# Patient Record
Sex: Female | Born: 1970
Health system: Southern US, Community
[De-identification: ages and names within clinical notes are randomized; demographics above are authoritative.]

## PROBLEM LIST (undated history)

## (undated) DIAGNOSIS — E271 Primary adrenocortical insufficiency: Secondary | ICD-10-CM

## (undated) DIAGNOSIS — Z87442 Personal history of urinary calculi: Secondary | ICD-10-CM

## (undated) DIAGNOSIS — G4730 Sleep apnea, unspecified: Secondary | ICD-10-CM

## (undated) DIAGNOSIS — T7840XA Allergy, unspecified, initial encounter: Secondary | ICD-10-CM

## (undated) DIAGNOSIS — M549 Dorsalgia, unspecified: Secondary | ICD-10-CM

## (undated) DIAGNOSIS — K219 Gastro-esophageal reflux disease without esophagitis: Secondary | ICD-10-CM

## (undated) DIAGNOSIS — M1990 Unspecified osteoarthritis, unspecified site: Secondary | ICD-10-CM

## (undated) DIAGNOSIS — T8859XA Other complications of anesthesia, initial encounter: Secondary | ICD-10-CM

## (undated) HISTORY — DX: Unspecified osteoarthritis, unspecified site: M19.90

## (undated) HISTORY — PX: WISDOM TOOTH EXTRACTION: SHX21

## (undated) HISTORY — PX: CHOLECYSTECTOMY: SHX55

## (undated) HISTORY — PX: OTHER SURGICAL HISTORY: SHX169

## (undated) HISTORY — DX: Primary adrenocortical insufficiency: E27.1

## (undated) HISTORY — PX: POLYPECTOMY: SHX149

## (undated) HISTORY — DX: Gastro-esophageal reflux disease without esophagitis: K21.9

---

## 2002-08-01 HISTORY — PX: DIAGNOSTIC LAPAROSCOPY: SUR761

## 2008-08-01 HISTORY — PX: DILATION AND CURETTAGE OF UTERUS: SHX78

## 2008-08-01 HISTORY — PX: CHOLECYSTECTOMY: SHX55

## 2018-03-15 ENCOUNTER — Other Ambulatory Visit (HOSPITAL_COMMUNITY): Payer: Self-pay | Admitting: Neurology

## 2018-03-15 DIAGNOSIS — M5416 Radiculopathy, lumbar region: Secondary | ICD-10-CM

## 2018-03-19 DIAGNOSIS — E271 Primary adrenocortical insufficiency: Secondary | ICD-10-CM | POA: Diagnosis not present

## 2018-03-19 DIAGNOSIS — G8929 Other chronic pain: Secondary | ICD-10-CM | POA: Diagnosis not present

## 2018-03-19 DIAGNOSIS — Z79891 Long term (current) use of opiate analgesic: Secondary | ICD-10-CM | POA: Diagnosis not present

## 2018-03-19 DIAGNOSIS — M461 Sacroiliitis, not elsewhere classified: Secondary | ICD-10-CM | POA: Diagnosis not present

## 2018-03-19 DIAGNOSIS — M533 Sacrococcygeal disorders, not elsewhere classified: Secondary | ICD-10-CM | POA: Diagnosis not present

## 2018-03-19 DIAGNOSIS — M4726 Other spondylosis with radiculopathy, lumbar region: Secondary | ICD-10-CM | POA: Diagnosis not present

## 2018-03-19 DIAGNOSIS — M47816 Spondylosis without myelopathy or radiculopathy, lumbar region: Secondary | ICD-10-CM | POA: Diagnosis not present

## 2018-03-19 DIAGNOSIS — Z79899 Other long term (current) drug therapy: Secondary | ICD-10-CM | POA: Diagnosis not present

## 2018-03-19 DIAGNOSIS — Z87891 Personal history of nicotine dependence: Secondary | ICD-10-CM | POA: Diagnosis not present

## 2018-03-19 DIAGNOSIS — M545 Low back pain: Secondary | ICD-10-CM | POA: Diagnosis not present

## 2018-03-19 DIAGNOSIS — E2839 Other primary ovarian failure: Secondary | ICD-10-CM | POA: Diagnosis not present

## 2018-03-19 DIAGNOSIS — M5416 Radiculopathy, lumbar region: Secondary | ICD-10-CM | POA: Diagnosis not present

## 2018-03-19 MED FILL — HYDROCORTISONE 10 MG TABLET: 10 | 90 days supply | Qty: 180 | Fill #0

## 2018-03-22 ENCOUNTER — Ambulatory Visit (HOSPITAL_COMMUNITY)
Admission: RE | Admit: 2018-03-22 | Discharge: 2018-03-22 | Disposition: A | Discharge status: Home / Self Care | Payer: 59 | Source: Ambulatory Visit | Attending: Neurology | Admitting: Neurology

## 2018-03-22 DIAGNOSIS — M47816 Spondylosis without myelopathy or radiculopathy, lumbar region: Secondary | ICD-10-CM | POA: Diagnosis not present

## 2018-03-22 DIAGNOSIS — M5416 Radiculopathy, lumbar region: Secondary | ICD-10-CM | POA: Diagnosis present

## 2018-03-22 DIAGNOSIS — M5126 Other intervertebral disc displacement, lumbar region: Secondary | ICD-10-CM | POA: Insufficient documentation

## 2018-03-22 DIAGNOSIS — M48061 Spinal stenosis, lumbar region without neurogenic claudication: Secondary | ICD-10-CM | POA: Diagnosis not present

## 2018-04-03 MED FILL — FLUDROCORTISONE 0.1 MG TAB: 0.1 | 90 days supply | Qty: 45 | Fill #0

## 2018-04-11 ENCOUNTER — Ambulatory Visit: Payer: Self-pay | Admitting: Primary Care

## 2018-04-16 DIAGNOSIS — Z79891 Long term (current) use of opiate analgesic: Secondary | ICD-10-CM | POA: Diagnosis not present

## 2018-04-16 DIAGNOSIS — M4726 Other spondylosis with radiculopathy, lumbar region: Secondary | ICD-10-CM | POA: Diagnosis not present

## 2018-04-16 DIAGNOSIS — M461 Sacroiliitis, not elsewhere classified: Secondary | ICD-10-CM | POA: Diagnosis not present

## 2018-04-16 DIAGNOSIS — Z87891 Personal history of nicotine dependence: Secondary | ICD-10-CM | POA: Diagnosis not present

## 2018-04-16 DIAGNOSIS — M5416 Radiculopathy, lumbar region: Secondary | ICD-10-CM | POA: Diagnosis not present

## 2018-04-16 DIAGNOSIS — M47816 Spondylosis without myelopathy or radiculopathy, lumbar region: Secondary | ICD-10-CM | POA: Diagnosis not present

## 2018-04-16 DIAGNOSIS — M533 Sacrococcygeal disorders, not elsewhere classified: Secondary | ICD-10-CM | POA: Diagnosis not present

## 2018-04-23 MED FILL — BACLOFEN 10 MG TABLET: 10 | 30 days supply | Qty: 45 | Fill #0

## 2018-04-30 ENCOUNTER — Encounter (INDEPENDENT_AMBULATORY_CARE_PROVIDER_SITE_OTHER): Payer: Self-pay

## 2018-04-30 ENCOUNTER — Encounter: Payer: Self-pay | Admitting: Family Medicine

## 2018-04-30 ENCOUNTER — Other Ambulatory Visit: Payer: Self-pay | Admitting: Family Medicine

## 2018-04-30 ENCOUNTER — Ambulatory Visit: Payer: 59 | Admitting: Family Medicine

## 2018-04-30 VITALS — BP 136/82 | HR 94 | Temp 98.5°F | Ht 63.25 in | Wt 179.0 lb

## 2018-04-30 DIAGNOSIS — E559 Vitamin D deficiency, unspecified: Secondary | ICD-10-CM

## 2018-04-30 DIAGNOSIS — Z13220 Encounter for screening for lipoid disorders: Secondary | ICD-10-CM

## 2018-04-30 DIAGNOSIS — M5442 Lumbago with sciatica, left side: Secondary | ICD-10-CM

## 2018-04-30 DIAGNOSIS — Z7689 Persons encountering health services in other specified circumstances: Secondary | ICD-10-CM

## 2018-04-30 DIAGNOSIS — Z6831 Body mass index (BMI) 31.0-31.9, adult: Secondary | ICD-10-CM | POA: Diagnosis not present

## 2018-04-30 DIAGNOSIS — Z1321 Encounter for screening for nutritional disorder: Secondary | ICD-10-CM

## 2018-04-30 DIAGNOSIS — M5441 Lumbago with sciatica, right side: Secondary | ICD-10-CM | POA: Diagnosis not present

## 2018-04-30 DIAGNOSIS — R7309 Other abnormal glucose: Secondary | ICD-10-CM | POA: Diagnosis not present

## 2018-04-30 DIAGNOSIS — R7303 Prediabetes: Secondary | ICD-10-CM

## 2018-04-30 DIAGNOSIS — E6609 Other obesity due to excess calories: Secondary | ICD-10-CM | POA: Insufficient documentation

## 2018-04-30 DIAGNOSIS — E271 Primary adrenocortical insufficiency: Secondary | ICD-10-CM | POA: Diagnosis not present

## 2018-04-30 DIAGNOSIS — G8929 Other chronic pain: Secondary | ICD-10-CM | POA: Diagnosis not present

## 2018-04-30 LAB — HEMOGLOBIN A1C: Hgb A1c MFr Bld: 6.1 % (ref 4.6–6.5)

## 2018-04-30 LAB — LIPID PANEL
Cholesterol: 244 mg/dL — ABNORMAL HIGH (ref 0–200)
HDL: 86.7 mg/dL (ref >39.00)
LDL Cholesterol: 144 mg/dL — ABNORMAL HIGH (ref 0–99)
NonHDL: 157.32
Total CHOL/HDL Ratio: 3
Triglycerides: 65 mg/dL (ref 0.0–149.0)
VLDL: 13 mg/dL (ref 0.0–40.0)

## 2018-04-30 LAB — VITAMIN D 25 HYDROXY (VIT D DEFICIENCY, FRACTURES): VITD: 13.55 ng/mL — ABNORMAL LOW (ref 30.00–100.00)

## 2018-04-30 MED ORDER — VITAMIN D3 1.25 MG (50000 UT) PO TABS: 1.0000 | ORAL_TABLET | ORAL | 3 refills | Status: DC

## 2018-04-30 MED FILL — VITAMIN D3 50000 UNIT CAPS: 1.25 MG | 84 days supply | Qty: 12 | Fill #0

## 2018-04-30 NOTE — Progress Notes (Signed)
Subjective:    Patient ID: Denise Burns, female    DOB: Sep 10, 1970, 47 y.o.   MRN: [id number]  HPI This is a 47 yo female who presents today to establish care. She works at [employer] since July. Moved here last year. Lives with husband. Has cats. Enjoys gardening, shopping.   Sees endocrinology in [place]. Has diagnosis of Addison's. Recently saw endocrine- had hormone levels, TSH, CBC.   Back pain, arthritis. Not inflammatory.   Last CPE-  5 years ago Mammo- never Pap- is going to gyn Tdap- recently, will get records.  Flu- will get at work Eye- just went- glasses Dental- over due, going soon Exercise- walks at work Sleep- good Stress- not high   Past Medical History:  Diagnosis Date  . Arthritis   . GERD (gastroesophageal reflux disease)    Past Surgical History:  Procedure Laterality Date  . CHOLECYSTECTOMY    . polyectomy   2011   Family History  Problem Relation Age of Onset  . Arthritis Mother   . Hyperlipidemia Mother   . Hypertension Mother   . Alcohol abuse Father   . Hearing loss Father   . Hyperlipidemia Father    Social History   Tobacco Use  . Smoking status: Former Research scientist (life sciences)  . Smokeless tobacco: Never Used  Substance Use Topics  . Alcohol use: Yes    Comment: occ  . Drug use: Never      Review of Systems No headaches, no abdominal pain/diarrhea/constipation, back pain (recent steroid injections, MRI), no SOB or chest pain    Objective:   Physical Exam Physical Exam  Constitutional: Oriented to person, place, and time. She appears well-developed and well-nourished.  HENT:  Head: Normocephalic and atraumatic.  Eyes: Conjunctivae are normal.  Neck: Normal range of motion. Neck supple.  Cardiovascular: Normal rate, regular rhythm and normal heart sounds.   Pulmonary/Chest: Effort normal and breath sounds normal.  Musculoskeletal: Normal range of motion. No LE edema.  Neurological: Alert and oriented to person, place, and time.    Skin: Skin is warm and dry.  Psychiatric: Normal mood and affect. Behavior is normal. Judgment and thought content normal.  Vitals reviewed.                                   BP 136/82 (BP Location: Right Arm, Patient Position: Sitting, Cuff Size: Normal)   Pulse 94   Temp 98.5 F (36.9 C) (Oral)   Ht 5' 3.25" (1.607 m)   Wt 179 lb (81.2 kg)   SpO2 97%   BMI 31.46 kg/m      Assessment & Plan:  1. Encounter to establish care - Discussed and encouraged healthy lifestyle choices- adequate sleep, regular exercise, stress management and healthy food choices.  - will request records from prior pcp - encouraged her to schedule mammogram and provided contact information  2. Chronic midline low back pain with bilateral sciatica - Ambulatory referral to Sports Medicine- evaluation for PT, exercises, ? manipulation  3. Elevated glucose - Lipid Panel - Hemoglobin A1c  4. Encounter for vitamin deficiency screening - Vitamin D, 25-hydroxy  5. Screening for lipid disorders - Lipid Panel  6. Class 1 obesity due to excess calories without serious comorbidity with body mass index (BMI) of 31.0 to 31.9 in adult - Lipid Panel - Hemoglobin A1c  7. Addison disease ([place]) - Ambulatory referral to Endocrinology- needs to establish locally  -  follow up in 1 year, sooner if needed  Clarene Reamer, FNP-BC  [facility] at [facility], [place]  04/30/2018 9:01 AM

## 2018-04-30 NOTE — Addendum Note (Signed)
Addended by: Clarene Reamer B on: 04/30/2018 01:37 PM   Modules accepted: Orders

## 2018-04-30 NOTE — Patient Instructions (Addendum)
Please call and schedule an appointment for screening mammogram. A referral is not needed.  [place]- across from [place]- [phone]  [place]

## 2018-05-03 ENCOUNTER — Encounter: Payer: Self-pay | Admitting: Family Medicine

## 2018-05-04 ENCOUNTER — Encounter: Payer: Self-pay | Admitting: Family Medicine

## 2018-05-04 ENCOUNTER — Ambulatory Visit: Payer: 59 | Admitting: Family Medicine

## 2018-05-04 VITALS — BP 132/86 | HR 97 | Temp 98.6°F | Ht 63.25 in | Wt 183.0 lb

## 2018-05-04 DIAGNOSIS — B9789 Other viral agents as the cause of diseases classified elsewhere: Secondary | ICD-10-CM | POA: Diagnosis not present

## 2018-05-04 DIAGNOSIS — J069 Acute upper respiratory infection, unspecified: Secondary | ICD-10-CM

## 2018-05-04 MED ORDER — BENZONATATE 100 MG PO CAPS: 100.0000 mg | ORAL_CAPSULE | Freq: Two times a day (BID) | ORAL | 0 refills | Status: DC | PRN

## 2018-05-04 MED ORDER — HYDROCODONE-HOMATROPINE 5-1.5 MG/5ML PO SYRP: 5.0000 mL | ORAL_SOLUTION | Freq: Three times a day (TID) | ORAL | 0 refills | Status: DC | PRN

## 2018-05-04 MED ORDER — AMOXICILLIN 875 MG PO TABS: 875.0000 mg | ORAL_TABLET | Freq: Two times a day (BID) | ORAL | 0 refills | Status: DC

## 2018-05-04 NOTE — Patient Instructions (Signed)
Good to see you today  I think you have a viral illness, continue to treat symptoms  I have sent in a pill for cough that won't make you sleepy and well as liquid that may make you sleepy  If not better in 3-5 days or if worsening symptoms, can take antibiotic

## 2018-05-04 NOTE — Progress Notes (Signed)
Subjective:    Patient ID: Denise Burns, female    DOB: 10-18-1970, 47 y.o.   MRN: [id number]  HPI This is a 47 yo female who presents today with 4 days of nasal drainage, dry cough, sore throat, some ear pain/headache. No wheeze, has felt a little winded. Sinus pressure. Has been taking sudafed, claritin, Delsym with some relief. Temp to 100.9. No muscle aches. Has addison's and is on hydrocortisone 10 mg.   Doesn't get sick frequently. 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   Past Medical History:  Diagnosis Date  . Arthritis   . GERD (gastroesophageal reflux disease)    Past Surgical History:  Procedure Laterality Date  . CHOLECYSTECTOMY    . polyectomy   2011   Family History  Problem Relation Age of Onset  . Arthritis Mother   . Hyperlipidemia Mother   . Hypertension Mother   . Alcohol abuse Father   . Hearing loss Father   . Hyperlipidemia Father    Social History   Tobacco Use  . Smoking status: Former Research scientist (life sciences)  . Smokeless tobacco: Never Used  Substance Use Topics  . Alcohol use: Yes    Comment: occ  . Drug use: Never      Review of Systems Per HPI    Objective:   Physical Exam  Constitutional: She is oriented to person, place, and time. She appears well-developed and well-nourished. No distress.  HENT:  Head: Normocephalic and atraumatic.  Right Ear: Tympanic membrane, external ear and ear canal normal.  Left Ear: Tympanic membrane, external ear and ear canal normal.  Nose: Mucosal edema and rhinorrhea present.  Mouth/Throat: Uvula is midline and mucous membranes are normal. Posterior oropharyngeal erythema present. No oropharyngeal exudate, posterior oropharyngeal edema or tonsillar abscesses.  Audible nasal congestion.   Eyes: Conjunctivae are normal.  Neck: Normal range of motion. Neck supple.  Cardiovascular: Normal rate, regular rhythm and normal heart sounds.  Pulmonary/Chest: Effort normal and breath sounds normal.    Lymphadenopathy:    She has no cervical adenopathy.  Neurological: She is alert and oriented to person, place, and time.  Skin: Skin is warm and dry. She is not diaphoretic.  Psychiatric: She has a normal mood and affect. Her behavior is normal. Judgment and thought content normal.  Vitals reviewed.     BP 132/86 (BP Location: Right Arm, Patient Position: Sitting, Cuff Size: Large)   Pulse 97   Temp 98.6 F (37 C) (Oral)   Ht 5' 3.25" (1.607 m)   Wt 183 lb (83 kg)   SpO2 97%   BMI 32.16 kg/m  Wt Readings from Last 3 Encounters:  05/04/18 183 lb (83 kg)  04/30/18 179 lb (81.2 kg)       Assessment & Plan:  1. Viral URI with cough - Provided written and verbal information regarding diagnosis and treatment. -  Patient Instructions  Good to see you today  I think you have a viral illness, continue to treat symptoms  I have sent in a pill for cough that won't make you sleepy and well as liquid that may make you sleepy  If not better in 3-5 days or if worsening symptoms, can take antibiotic    - benzonatate (TESSALON) 100 MG capsule; Take 1 capsule (100 mg total) by mouth 2 (two) times daily as needed for cough.  Dispense: 20 capsule; Refill: 0 - HYDROcodone-homatropine (HYCODAN) 5-1.5 MG/5ML syrup; Take 5 mLs by mouth every 8 (eight)  hours as needed for cough.  Dispense: 90 mL; Refill: 0 - amoxicillin (AMOXIL) 875 MG tablet; Take 1 tablet (875 mg total) by mouth 2 (two) times daily.  Dispense: 14 tablet; Refill: 0   Clarene Reamer, FNP-BC  [facility] at [facility]  05/04/2018 12:57 PM

## 2018-05-07 MED FILL — AMOXICILLIN 875 MG TABS: 875 | 7 days supply | Qty: 14 | Fill #0

## 2018-05-11 ENCOUNTER — Other Ambulatory Visit: Payer: Self-pay | Admitting: Family Medicine

## 2018-05-11 ENCOUNTER — Encounter: Payer: Self-pay | Admitting: Family Medicine

## 2018-05-11 DIAGNOSIS — B9789 Other viral agents as the cause of diseases classified elsewhere: Principal | ICD-10-CM

## 2018-05-11 DIAGNOSIS — J069 Acute upper respiratory infection, unspecified: Secondary | ICD-10-CM

## 2018-05-11 MED ORDER — HYDROCODONE-HOMATROPINE 5-1.5 MG/5ML PO SYRP: 5.0000 mL | ORAL_SOLUTION | Freq: Three times a day (TID) | ORAL | 0 refills | Status: DC | PRN

## 2018-05-16 ENCOUNTER — Encounter: Payer: Self-pay | Admitting: Obstetrics and Gynecology

## 2018-05-16 ENCOUNTER — Ambulatory Visit: Payer: 59 | Admitting: Obstetrics and Gynecology

## 2018-05-16 ENCOUNTER — Other Ambulatory Visit (HOSPITAL_COMMUNITY)
Admission: RE | Admit: 2018-05-16 | Discharge: 2018-05-16 | Disposition: A | Discharge status: Home / Self Care | Payer: 59 | Source: Ambulatory Visit | Attending: Obstetrics and Gynecology | Admitting: Obstetrics and Gynecology

## 2018-05-16 VITALS — BP 116/88 | HR 94 | Ht 63.25 in | Wt 189.0 lb

## 2018-05-16 DIAGNOSIS — N858 Other specified noninflammatory disorders of uterus: Secondary | ICD-10-CM | POA: Diagnosis not present

## 2018-05-16 DIAGNOSIS — N950 Postmenopausal bleeding: Secondary | ICD-10-CM

## 2018-05-16 DIAGNOSIS — E271 Primary adrenocortical insufficiency: Secondary | ICD-10-CM | POA: Diagnosis not present

## 2018-05-16 NOTE — Progress Notes (Signed)
Pt presents today with unexplained bleeding after 3 years of menopause starting October 13 through today.Pt also states a lot of pain and pressure in lower abdomen.

## 2018-05-16 NOTE — Progress Notes (Signed)
HPI:      [name] is a 47 y.o. No obstetric history on file. who LMP was No LMP recorded. Patient is postmenopausal.  Subjective:   She presents today with complaint of new onset of vaginal bleeding.  Patient states that she has been in menopause for more than 10 years.  She initially took OCPs during the first several years of menopause but approximately 4 years ago discontinued them.  She has had no bleeding since that time with the exception of earlier this week when she began having bleeding consistent with a light period. Of significant note patient has Addison disease and takes steroids daily since 2006. She is not sexually active    Hx: The following portions of the patient's history were reviewed and updated as appropriate:             She  has a past medical history of Addison disease ([place]), Arthritis, and GERD (gastroesophageal reflux disease). She does not have any pertinent problems on file. She  has a past surgical history that includes Cholecystectomy and polyectomy  (2011). Her family history includes Alcohol abuse in her father; Arthritis in her mother; Hearing loss in her father; Hyperlipidemia in her father and mother; Hypertension in her mother. She  reports that she has quit smoking. She has never used smokeless tobacco. She reports that she drinks alcohol. She reports that she does not use drugs. She has a current medication list which includes the following prescription(s): baclofen, vitamin d3, vitamin d3, fludrocortisone, hydrocodone-acetaminophen, hydrocodone-homatropine, hydrocortisone, amoxicillin, and benzonatate. She is allergic to lisinopril; ultram [tramadol]; and sulfa antibiotics.       Review of Systems:  Review of Systems  Constitutional: Denied constitutional symptoms, night sweats, recent illness, fatigue, fever, insomnia and weight loss.  Eyes: Denied eye symptoms, eye pain, photophobia, vision change and visual disturbance.   Ears/Nose/Throat/Neck: Denied ear, nose, throat or neck symptoms, hearing loss, nasal discharge, sinus congestion and sore throat.  Cardiovascular: Denied cardiovascular symptoms, arrhythmia, chest pain/pressure, edema, exercise intolerance, orthopnea and palpitations.  Respiratory: Denied pulmonary symptoms, asthma, pleuritic pain, productive sputum, cough, dyspnea and wheezing.  Gastrointestinal: Denied, gastro-esophageal reflux, melena, nausea and vomiting.  Genitourinary: See HPI for additional information.  Musculoskeletal: Denied musculoskeletal symptoms, stiffness, swelling, muscle weakness and myalgia.  Dermatologic: Denied dermatology symptoms, rash and scar.  Neurologic: Denied neurology symptoms, dizziness, headache, neck pain and syncope.  Psychiatric: Denied psychiatric symptoms, anxiety and depression.  Endocrine: Denied endocrine symptoms including hot flashes and night sweats.   Meds:   Current Outpatient Medications on File Prior to Visit  Medication Sig Dispense Refill  . baclofen (LIORESAL) 10 MG tablet   4  . Cholecalciferol (VITAMIN D3) 50000 units CAPS   3  . Cholecalciferol (VITAMIN D3) 50000 units TABS Take 1 tablet by mouth every 7 (seven) days. 12 tablet 3  . fludrocortisone (FLORINEF) 0.1 MG tablet   3  . HYDROcodone-acetaminophen (NORCO) 7.5-325 MG tablet TK 1 T PO TID PRF PAIN  DNF 04/22/2018  0  . HYDROcodone-homatropine (HYCODAN) 5-1.5 MG/5ML syrup Take 5 mLs by mouth every 8 (eight) hours as needed for cough. 90 mL 0  . hydrocortisone (CORTEF) 10 MG tablet   3  . amoxicillin (AMOXIL) 875 MG tablet Take 1 tablet (875 mg total) by mouth 2 (two) times daily. (Patient not taking: Reported on 05/16/2018) 14 tablet 0  . benzonatate (TESSALON) 100 MG capsule Take 1 capsule (100 mg total) by mouth 2 (two) times daily as needed  for cough. (Patient not taking: Reported on 05/16/2018) 20 capsule 0   No current facility-administered medications on file prior to visit.      Objective:     Vitals:   05/16/18 0818  BP: 116/88  Pulse: 94              Physical examination   Pelvic:   Vulva: Normal appearance.  No lesions.  Vagina: No lesions or abnormalities noted.  Moderate vaginal atrophy noted  Support: Normal pelvic support.  Urethra No masses tenderness or scarring.  Meatus Normal size without lesions or prolapse.  Cervix: Normal appearance.  No lesions.  Small amount of bleeding appeared to be coming from the cervical loss.  Anus: Normal exam.  No lesions.  Perineum: Normal exam.  No lesions.        Bimanual   Uterus:  Approximately 10 weeks size slightly irregular non-tender.  Mobile.  AV.  Adnexae: No masses.  Non-tender to palpation.  Cul-de-sac: Negative for abnormality.   Endometrial Biopsy After discussion with the patient regarding her abnormal uterine bleeding I recommended that she proceed with an endometrial biopsy for further diagnosis. The risks, benefits, alternatives, and indications for an endometrial biopsy were discussed with the patient in detail. She understood the risks including infection, bleeding, cervical laceration and uterine perforation.  Verbal consent was obtained.   PROCEDURE NOTE:  Vacurette endometrial biopsy was performed using aseptic technique with iodine preparation.  The uterus was sounded to a length of 7 cm.  Adequate sampling was obtained with minimal blood loss.  Only a small amount of tissue was noted.  The patient tolerated the procedure well.  Disposition will be pending pathology   Assessment:    No obstetric history on file. Patient Active Problem List   Diagnosis Date Noted  . Addison disease ([place]) 04/30/2018  . Class 1 obesity due to excess calories without serious comorbidity with body mass index (BMI) of 31.0 to 31.9 in adult 04/30/2018  . Chronic midline low back pain with bilateral sciatica 04/30/2018     1. Postmenopausal bleeding   2. Addison disease ([place])        Plan:             1.  We will review the endometrial biopsy results and decide management at that time.  2.  We have discussed the possibility of HRT in the future and will have a much longer discussion regarding risks and benefits once we resolve this postmenopausal bleeding episode. Orders No orders of the defined types were placed in this encounter.   No orders of the defined types were placed in this encounter.     F/U  Return for We will contact her with any abnormal test results. I spent 23 minutes involved in the care of this patient of which greater than 50% was spent discussing postmenopausal bleeding causes work-up, fibroids natural course and history of bleeding issues, Addison's disease, steroids and their effect on bleeding, possible HRT.  Finis Bud, M.D. 05/16/2018 9:00 AM

## 2018-05-18 ENCOUNTER — Ambulatory Visit: Payer: 59 | Admitting: Family Medicine

## 2018-05-18 ENCOUNTER — Encounter: Payer: Self-pay | Admitting: Family Medicine

## 2018-05-18 DIAGNOSIS — G8929 Other chronic pain: Secondary | ICD-10-CM | POA: Diagnosis not present

## 2018-05-18 DIAGNOSIS — M5442 Lumbago with sciatica, left side: Secondary | ICD-10-CM

## 2018-05-18 DIAGNOSIS — M5441 Lumbago with sciatica, right side: Secondary | ICD-10-CM

## 2018-05-18 NOTE — Patient Instructions (Signed)
Nice to meet you  Please try the exercises  Please avoid periods of sitting  Please try heat and massage over the area  Please see me back in 3-4 weeks if your symptoms are not improving or if you want to try physical therapy.

## 2018-05-18 NOTE — Progress Notes (Signed)
Denise Burns - 47 y.o. female MRN [id number]  Date of birth: 04-18-1971  SUBJECTIVE:  Including CC & ROS.  No chief complaint on file.   Denise Burns is a 47 y.o. female that is presenting with low back pain.  The pain is acute on chronic in nature.  She has gotten epidural injections in the past with some improvement.  She denies any mechanism of injury.  Her symptoms are constant staying the same.  Denies any numbness or tingling.  Pain is mostly localized to the lower back but does have some radicular symptoms in the bilateral legs.  She feels the symptoms in the posterior thighs.  She denies any saddle anesthesia or urinary incontinence.  Symptoms seem to be worse with prolonged sitting.  Review of the MRI lumbar spine from 8/22 shows chronic bilateral pars defect at L5 with a 8 mm spondylolisthesis and moderate bilateral L5 foraminal stenosis.   Review of Systems  Constitutional: Negative for fever.  HENT: Negative for congestion.   Respiratory: Negative for cough.   Cardiovascular: Negative for chest pain.  Gastrointestinal: Negative for abdominal pain.  Musculoskeletal: Positive for back pain.  Skin: Negative for color change.  Neurological: Negative for weakness.  Hematological: Negative for adenopathy.  Psychiatric/Behavioral: Negative for agitation.    HISTORY: Past Medical, Surgical, Social, and Family History Reviewed & Updated per EMR.   Pertinent Historical Findings include:  Past Medical History:  Diagnosis Date  . Addison disease ([place])   . Arthritis   . GERD (gastroesophageal reflux disease)     Past Surgical History:  Procedure Laterality Date  . CHOLECYSTECTOMY    . polyectomy   2011    Allergies  Allergen Reactions  . Lisinopril Cough  . Ultram [Tramadol] Other (See Comments)  . Sulfa Antibiotics Other (See Comments)    Family History  Problem Relation Age of Onset  . Arthritis Mother   . Hyperlipidemia Mother   . Hypertension Mother   . Alcohol  abuse Father   . Hearing loss Father   . Hyperlipidemia Father      Social History   Socioeconomic History  . Marital status: Married    Spouse name: Not on file  . Number of children: Not on file  . Years of education: Not on file  . Highest education level: Not on file  Occupational History  . Not on file  Social Needs  . Financial resource strain: Not on file  . Food insecurity:    Worry: Not on file    Inability: Not on file  . Transportation needs:    Medical: Not on file    Non-medical: Not on file  Tobacco Use  . Smoking status: Former Research scientist (life sciences)  . Smokeless tobacco: Never Used  Substance and Sexual Activity  . Alcohol use: Yes    Comment: occ  . Drug use: Never  . Sexual activity: Yes    Partners: Male  Lifestyle  . Physical activity:    Days per week: Not on file    Minutes per session: Not on file  . Stress: Not on file  Relationships  . Social connections:    Talks on phone: Not on file    Gets together: Not on file    Attends religious service: Not on file    Active member of club or organization: Not on file    Attends meetings of clubs or organizations: Not on file    Relationship status: Not on file  . Intimate partner violence:  Fear of current or ex partner: Not on file    Emotionally abused: Not on file    Physically abused: Not on file    Forced sexual activity: Not on file  Other Topics Concern  . Not on file  Social History Narrative  . Not on file     PHYSICAL EXAM:  VS: BP 118/80   Pulse (!) 102   Resp 16   Wt 193 lb (87.5 kg)   SpO2 98%   BMI 33.92 kg/m  Physical Exam Gen: NAD, alert, cooperative with exam, well-appearing ENT: normal lips, normal nasal mucosa,  Eye: normal EOM, normal conjunctiva and lids CV:  no edema, +2 pedal pulses   Resp: no accessory muscle use, non-labored,  Skin: no rashes, no areas of induration  Neuro: normal tone, normal sensation to touch Psych:  normal insight, alert and oriented MSK:    Back: No specific tenderness to palpation over the lumbar midline spine or paraspinal muscles. Normal range of motion. Normal hip internal rotation and external rotation. Normal strength resistance with hip flexion, knee flexion and extension, plantarflexion and dorsiflexion. Negative straight leg raise bilaterally. Neurovascularly intact     ASSESSMENT & PLAN:   Chronic midline low back pain with bilateral sciatica Has suggestions on MRI as to why she can have pain in the back as well as in the buttock/posterior hamstrings.  Her pain seems to be mostly associated with the pars defect and slippage.  The radicular symptoms seem to be less severe or apparent.  Symptoms seem to be worse with prolonged sitting. -Counseled on supportive care and home exercise therapy. -Counseled on her posture. -We will if no improvement can consider epidurals versus physical therapy. -If repeating any imaging would repeat with flexion and extension views of the lumbar spine to evaluate the slip further.  May need to discuss with neurosurgery at some point.

## 2018-05-20 NOTE — Assessment & Plan Note (Signed)
Has suggestions on MRI as to why she can have pain in the back as well as in the buttock/posterior hamstrings.  Her pain seems to be mostly associated with the pars defect and slippage.  The radicular symptoms seem to be less severe or apparent.  Symptoms seem to be worse with prolonged sitting. -Counseled on supportive care and home exercise therapy. -Counseled on her posture. -We will if no improvement can consider epidurals versus physical therapy. -If repeating any imaging would repeat with flexion and extension views of the lumbar spine to evaluate the slip further.  May need to discuss with neurosurgery at some point.

## 2018-05-23 ENCOUNTER — Encounter: Payer: Self-pay | Admitting: Family Medicine

## 2018-05-28 ENCOUNTER — Other Ambulatory Visit: Payer: Self-pay | Admitting: Family Medicine

## 2018-05-28 DIAGNOSIS — M5442 Lumbago with sciatica, left side: Principal | ICD-10-CM

## 2018-05-28 DIAGNOSIS — M5441 Lumbago with sciatica, right side: Principal | ICD-10-CM

## 2018-05-28 DIAGNOSIS — G8929 Other chronic pain: Secondary | ICD-10-CM

## 2018-06-01 MED FILL — HYDROCORTISONE 10 MG TABLET: 10 | 90 days supply | Qty: 180 | Fill #1

## 2018-06-07 ENCOUNTER — Encounter: Payer: 59 | Admitting: Obstetrics and Gynecology

## 2018-06-07 NOTE — Addendum Note (Signed)
Addended by: Finis Bud on: 06/07/2018 05:50 PM   Modules accepted: Orders

## 2018-06-08 ENCOUNTER — Encounter: Payer: 59 | Admitting: Obstetrics and Gynecology

## 2018-06-14 ENCOUNTER — Ambulatory Visit (INDEPENDENT_AMBULATORY_CARE_PROVIDER_SITE_OTHER): Payer: 59

## 2018-06-14 DIAGNOSIS — N950 Postmenopausal bleeding: Secondary | ICD-10-CM | POA: Diagnosis not present

## 2018-06-14 DIAGNOSIS — N83202 Unspecified ovarian cyst, left side: Secondary | ICD-10-CM

## 2018-06-14 DIAGNOSIS — M4726 Other spondylosis with radiculopathy, lumbar region: Secondary | ICD-10-CM | POA: Diagnosis not present

## 2018-06-14 DIAGNOSIS — Z87891 Personal history of nicotine dependence: Secondary | ICD-10-CM | POA: Diagnosis not present

## 2018-06-14 DIAGNOSIS — Z79891 Long term (current) use of opiate analgesic: Secondary | ICD-10-CM | POA: Diagnosis not present

## 2018-06-14 DIAGNOSIS — M47816 Spondylosis without myelopathy or radiculopathy, lumbar region: Secondary | ICD-10-CM | POA: Diagnosis not present

## 2018-06-14 DIAGNOSIS — Z5181 Encounter for therapeutic drug level monitoring: Secondary | ICD-10-CM | POA: Diagnosis not present

## 2018-06-14 DIAGNOSIS — G8929 Other chronic pain: Secondary | ICD-10-CM | POA: Diagnosis not present

## 2018-06-14 DIAGNOSIS — M5416 Radiculopathy, lumbar region: Secondary | ICD-10-CM | POA: Diagnosis not present

## 2018-06-14 DIAGNOSIS — M461 Sacroiliitis, not elsewhere classified: Secondary | ICD-10-CM | POA: Diagnosis not present

## 2018-06-14 NOTE — Progress Notes (Signed)
Pt presents today for annual exam. Pt states she is still having bleeding like a period. Pt also states she is due for a pap smear, her last was at least 5 years ago and resulted normal.

## 2018-06-15 ENCOUNTER — Encounter: Payer: Self-pay | Admitting: Family Medicine

## 2018-06-15 ENCOUNTER — Ambulatory Visit: Payer: 59 | Admitting: Family Medicine

## 2018-06-15 DIAGNOSIS — M5442 Lumbago with sciatica, left side: Secondary | ICD-10-CM | POA: Diagnosis not present

## 2018-06-15 DIAGNOSIS — G8929 Other chronic pain: Secondary | ICD-10-CM

## 2018-06-15 DIAGNOSIS — M5441 Lumbago with sciatica, right side: Secondary | ICD-10-CM

## 2018-06-15 NOTE — Progress Notes (Signed)
Denise Burns - 47 y.o. female MRN [id number]  Date of birth: [birth year]  SUBJECTIVE:  Including CC & ROS.  No chief complaint on file.   Denise Burns is a 47 y.o. female that is following up for her low back pain.  She has been trying the home exercises and that seems to be improving her pain.  She reports that the pain is intermittent at this time.  It is mild when it does occur.  She denies having to use any medications on a regular basis.  She denies any radicular symptoms.   Review of Systems  Constitutional: Negative for fever.  HENT: Negative for congestion.   Respiratory: Negative for cough.   Cardiovascular: Negative for chest pain.  Gastrointestinal: Negative for abdominal pain.  Musculoskeletal: Positive for back pain.  Skin: Negative for color change.  Neurological: Negative for weakness.  Hematological: Negative for adenopathy.  Psychiatric/Behavioral: Negative for agitation.    HISTORY: Past Medical, Surgical, Social, and Family History Reviewed & Updated per EMR.   Pertinent Historical Findings include:  Past Medical History:  Diagnosis Date  . Addison disease ([place])   . Arthritis   . GERD (gastroesophageal reflux disease)     Past Surgical History:  Procedure Laterality Date  . CHOLECYSTECTOMY    . polyectomy   2011    Allergies  Allergen Reactions  . Lisinopril Cough  . Ultram [Tramadol] Other (See Comments)  . Sulfa Antibiotics Other (See Comments)    Family History  Problem Relation Age of Onset  . Arthritis Mother   . Hyperlipidemia Mother   . Hypertension Mother   . Alcohol abuse Father   . Hearing loss Father   . Hyperlipidemia Father      Social History   Socioeconomic History  . Marital status: Married    Spouse name: Not on file  . Number of children: Not on file  . Years of education: Not on file  . Highest education level: Not on file  Occupational History  . Not on file  Social Needs  . Financial resource strain: Not on  file  . Food insecurity:    Worry: Not on file    Inability: Not on file  . Transportation needs:    Medical: Not on file    Non-medical: Not on file  Tobacco Use  . Smoking status: Former Research scientist (life sciences)  . Smokeless tobacco: Never Used  Substance and Sexual Activity  . Alcohol use: Yes    Comment: occ  . Drug use: Never  . Sexual activity: Yes    Partners: Male  Lifestyle  . Physical activity:    Days per week: Not on file    Minutes per session: Not on file  . Stress: Not on file  Relationships  . Social connections:    Talks on phone: Not on file    Gets together: Not on file    Attends religious service: Not on file    Active member of club or organization: Not on file    Attends meetings of clubs or organizations: Not on file    Relationship status: Not on file  . Intimate partner violence:    Fear of current or ex partner: Not on file    Emotionally abused: Not on file    Physically abused: Not on file    Forced sexual activity: Not on file  Other Topics Concern  . Not on file  Social History Narrative  . Not on file     PHYSICAL  EXAM:  VS: BP 120/74   Pulse 86   Resp 16   Wt 197 lb (89.4 kg)   SpO2 98%   BMI 34.62 kg/m  Physical Exam Gen: NAD, alert, cooperative with exam, well-appearing ENT: normal lips, normal nasal mucosa,  Eye: normal EOM, normal conjunctiva and lids CV:  no edema, +2 pedal pulses   Resp: no accessory muscle use, non-labored,  Skin: no rashes, no areas of induration  Neuro: normal tone, normal sensation to touch Psych:  normal insight, alert and oriented MSK:  Back: No tenderness palpation over the lumbar midline spine. No tenderness palpation of the SI joint, piriformis, or greater trochanter. Normal internal and external rotation. Normal strength resistance with hip flexion, knee flexion and extension, and dorsiflexion and plantarflexion Negative straight leg raise bilaterally Neurovascularly intact     ASSESSMENT & PLAN:    Chronic midline low back pain with bilateral sciatica Having improvement of her pain since starting home exercises. - continue home exercises  - if pain returns then would consider formal PT or epidural. Has received two epidurals this year with last one being in Sept.  - F/U pRN

## 2018-06-15 NOTE — Assessment & Plan Note (Signed)
Having improvement of her pain since starting home exercises. - continue home exercises  - if pain returns then would consider formal PT or epidural. Has received two epidurals this year with last one being in Sept.  - F/U pRN

## 2018-06-18 ENCOUNTER — Ambulatory Visit (INDEPENDENT_AMBULATORY_CARE_PROVIDER_SITE_OTHER): Payer: 59 | Admitting: Obstetrics and Gynecology

## 2018-06-18 ENCOUNTER — Encounter: Payer: Self-pay | Admitting: Obstetrics and Gynecology

## 2018-06-18 ENCOUNTER — Other Ambulatory Visit (HOSPITAL_COMMUNITY)
Admission: RE | Admit: 2018-06-18 | Discharge: 2018-06-18 | Disposition: A | Discharge status: Home / Self Care | Payer: 59 | Source: Ambulatory Visit | Attending: Obstetrics and Gynecology | Admitting: Obstetrics and Gynecology

## 2018-06-18 VITALS — BP 128/87 | HR 111 | Ht 63.25 in | Wt 187.0 lb

## 2018-06-18 DIAGNOSIS — N950 Postmenopausal bleeding: Secondary | ICD-10-CM | POA: Diagnosis not present

## 2018-06-18 DIAGNOSIS — Z0000 Encounter for general adult medical examination without abnormal findings: Secondary | ICD-10-CM

## 2018-06-18 NOTE — Progress Notes (Signed)
HPI:      [name] is a 47 y.o. No obstetric history on file. who LMP was Patient's last menstrual period was 06/08/2018 (exact date).  Subjective:   She presents today for her annual examination.  She continues to experience postmenopausal bleeding off and on almost like a menses.  She previously took HRT up until approximately 3 years ago.  She is interested in restarting HRT. Her pelvic ultrasound reveals an endometrium of 5.2 mm.  Her previous endometrial biopsy showed no evidence of hyperplasia or malignancy. Patient reports no family history of breast colon or ovarian cancer. She is due for screening mammogram and Pap smear.  She says that she is up-to-date on all of her blood work tests.    Hx: The following portions of the patient's history were reviewed and updated as appropriate:             She  has a past medical history of Addison disease ([place]), Arthritis, and GERD (gastroesophageal reflux disease). She does not have any pertinent problems on file. She  has a past surgical history that includes Cholecystectomy and polyectomy  (2011). Her family history includes Alcohol abuse in her father; Arthritis in her mother; Hearing loss in her father; Hyperlipidemia in her father and mother; Hypertension in her mother. She  reports that she has quit smoking. She has never used smokeless tobacco. She reports that she drinks alcohol. She reports that she does not use drugs. She has a current medication list which includes the following prescription(s): baclofen, vitamin d3, vitamin d3, fludrocortisone, hydrocodone-acetaminophen, and hydrocortisone. She is allergic to lisinopril; ultram [tramadol]; and sulfa antibiotics.       Review of Systems:  Review of Systems  Constitutional: Denied constitutional symptoms, night sweats, recent illness, fatigue, fever, insomnia and weight loss.  Eyes: Denied eye symptoms, eye pain, photophobia, vision change and visual disturbance.   Ears/Nose/Throat/Neck: Denied ear, nose, throat or neck symptoms, hearing loss, nasal discharge, sinus congestion and sore throat.  Cardiovascular: Denied cardiovascular symptoms, arrhythmia, chest pain/pressure, edema, exercise intolerance, orthopnea and palpitations.  Respiratory: Denied pulmonary symptoms, asthma, pleuritic pain, productive sputum, cough, dyspnea and wheezing.  Gastrointestinal: Denied, gastro-esophageal reflux, melena, nausea and vomiting.  Genitourinary: See HPI for additional information.  Musculoskeletal: Denied musculoskeletal symptoms, stiffness, swelling, muscle weakness and myalgia.  Dermatologic: Denied dermatology symptoms, rash and scar.  Neurologic: Denied neurology symptoms, dizziness, headache, neck pain and syncope.  Psychiatric: Denied psychiatric symptoms, anxiety and depression.  Endocrine: Denied endocrine symptoms including hot flashes and night sweats.   Meds:   Current Outpatient Medications on File Prior to Visit  Medication Sig Dispense Refill  . baclofen (LIORESAL) 10 MG tablet   4  . Cholecalciferol (VITAMIN D3) 50000 units CAPS   3  . Cholecalciferol (VITAMIN D3) 50000 units TABS Take 1 tablet by mouth every 7 (seven) days. 12 tablet 3  . fludrocortisone (FLORINEF) 0.1 MG tablet   3  . HYDROcodone-acetaminophen (NORCO) 7.5-325 MG tablet TK 1 T PO TID PRF PAIN  DNF 04/22/2018  0  . hydrocortisone (CORTEF) 10 MG tablet   3   No current facility-administered medications on file prior to visit.     Objective:     Vitals:   06/18/18 0807  BP: 128/87  Pulse: (!) 111              Physical examination General NAD, Conversant  HEENT Atraumatic; Op clear with mmm.  Normo-cephalic. Pupils reactive. Anicteric sclerae  Thyroid/Neck Smooth without  nodularity or enlargement. Normal ROM.  Neck Supple.  Skin No rashes, lesions or ulceration. Normal palpated skin turgor. No nodularity.  Breasts: No masses or discharge.  Symmetric.  No axillary  adenopathy.  Lungs: Clear to auscultation.No rales or wheezes. Normal Respiratory effort, no retractions.  Heart: NSR.  No murmurs or rubs appreciated. No periferal edema  Abdomen: Soft.  Non-tender.  No masses.  No HSM. No hernia  Extremities: Moves all appropriately.  Normal ROM for age. No lymphadenopathy.  Neuro: Oriented to PPT.  Normal mood. Normal affect.     Pelvic:   Vulva: Normal appearance.  No lesions.  Vagina: No lesions or abnormalities noted.  Support: Normal pelvic support.  Urethra No masses tenderness or scarring.  Meatus Normal size without lesions or prolapse.  Cervix: Normal appearance.  No lesions.  Small amount of blood noted to be coming from the cervical loss.  Anus: Normal exam.  No lesions.  Perineum: Normal exam.  No lesions.        Bimanual   Uterus: Normal size.  Non-tender.  Mobile.  AV.  Adnexae: No masses.  Non-tender to palpation.  Cul-de-sac: Negative for abnormality.      Assessment:    No obstetric history on file. Patient Active Problem List   Diagnosis Date Noted  . Addison disease ([place]) 04/30/2018  . Class 1 obesity due to excess calories without serious comorbidity with body mass index (BMI) of 31.0 to 31.9 in adult 04/30/2018  . Chronic midline low back pain with bilateral sciatica 04/30/2018     1. Encounter for annual physical exam   2. Postmenopausal bleeding     Because her endometrium is slightly thickened and she continues to experience bleeding I have recommended a D&C.  Patient agrees with this management.   Plan:            1.  Schedule preop for D&C   2.  Basic Screening Recommendations The basic screening recommendations for asymptomatic women were discussed with the patient during her visit.  The age-appropriate recommendations were discussed with her and the rational for the tests reviewed.  When I am informed by the patient that another primary care physician has previously obtained the age-appropriate tests and they  are up-to-date, only outstanding tests are ordered and referrals given as necessary.  Abnormal results of tests will be discussed with her when all of her results are completed.  Mammogram ordered, Pap performed  3.  Patient would like to start HRT after her D&C. HRT I have discussed HRT with the patient in detail.  The risk/benefits of it were reviewed.  She understands that during menopause Estrogen decreases dramatically and that this results in an increased risk of cardiovascular disease as well as osteoporosis.  We have also discussed the fact that hot flashes often result from a decrease in Estrogen, and that by replacing Estrogen, they can often be alleviated.  We have discussed skin, vaginal and urinary tract changes that may also take place from this drop in Estrogen.  Emotional changes have also been linked to Estrogen and we have briefly discussed this.  The benefits of HRT including decrease in hot flashes, vaginal dryness, and osteoporosis were discussed.  The emotional benefit and a possible change in her cardiovascular risk profile was also reviewed.  The risks associated with Hormone Replacement Therapy were also reviewed.  The use of unopposed Estrogen and its relationship to endometrial cancer was discussed.  The addition of Progesterone and its beneficial effect  on endometrial cancer was also noted.  The fact that there has been no consistent definitive studies showing an increase in breast cancer in women who use HRT was discussed with the patient.  The possible side effects including breast tenderness, fluid retention, mood changes and vaginal bleeding were discussed.  The patient was informed that this is an elective medication and that she may choose not to take Hormone Replacement Therapy.  Literature on HRT was given, and I believe that after answering all of the patient's questions, she has an adequate and informed understanding of HRT.  Special emphasis on the WHI study, as well as  several studies since that pertaining to the risks and benefits of estrogen replacement therapy were compared.  The possible limitations of these studies were discussed including the age stratification of the WHI study.  The possible role of Progesterone in these studies was discussed in detail.  I believe that the patient has an informed knowledge of the risks and benefits of HRT.  I have specifically discussed WHI findings and current updates.  Different type of hormone formulation and methods of taking hormone replacement therapy discussed.    Orders Orders Placed This Encounter  Procedures  . MM DIGITAL SCREENING BILATERAL    No orders of the defined types were placed in this encounter.       F/U  Return in about 2 weeks (around 07/02/2018).  Finis Bud, M.D. 06/18/2018 8:56 AM

## 2018-06-18 NOTE — Addendum Note (Signed)
Addended by: Lin Landsman on: 06/18/2018 09:06 AM   Modules accepted: Orders

## 2018-06-20 LAB — CYTOLOGY - PAP
Diagnosis: NEGATIVE
HPV: NOT DETECTED

## 2018-06-27 ENCOUNTER — Other Ambulatory Visit: Payer: Self-pay

## 2018-06-27 DIAGNOSIS — Z0000 Encounter for general adult medical examination without abnormal findings: Secondary | ICD-10-CM

## 2018-07-04 MED FILL — FLUDROCORTISONE 0.1 MG TAB: 0.1 | 90 days supply | Qty: 45 | Fill #1

## 2018-07-17 MED FILL — VITAMIN D3 50000 UNIT CAPS: 1.25 MG | 84 days supply | Qty: 12 | Fill #1

## 2018-08-02 ENCOUNTER — Encounter: Payer: 59 | Admitting: Obstetrics and Gynecology

## 2018-08-06 ENCOUNTER — Ambulatory Visit: Payer: 59 | Admitting: Internal Medicine

## 2018-08-06 ENCOUNTER — Ambulatory Visit: Payer: 59 | Admitting: Family Medicine

## 2018-08-06 ENCOUNTER — Encounter: Payer: Self-pay | Admitting: Internal Medicine

## 2018-08-06 VITALS — BP 138/78 | HR 93 | Ht 63.25 in | Wt 193.4 lb

## 2018-08-06 DIAGNOSIS — E288 Other ovarian dysfunction: Secondary | ICD-10-CM | POA: Diagnosis not present

## 2018-08-06 DIAGNOSIS — E2839 Other primary ovarian failure: Secondary | ICD-10-CM | POA: Insufficient documentation

## 2018-08-06 DIAGNOSIS — E271 Primary adrenocortical insufficiency: Secondary | ICD-10-CM | POA: Diagnosis not present

## 2018-08-06 LAB — BASIC METABOLIC PANEL
BUN: 16 mg/dL (ref 6–23)
CO2: 26 mEq/L (ref 19–32)
Calcium: 10.4 mg/dL (ref 8.4–10.5)
Chloride: 104 mEq/L (ref 96–112)
Creatinine, Ser: 0.87 mg/dL (ref 0.40–1.20)
GFR: 73.91 mL/min (ref >60.00)
Glucose, Bld: 112 mg/dL — ABNORMAL HIGH (ref 70–99)
Potassium: 4 mEq/L (ref 3.5–5.1)
Sodium: 139 mEq/L (ref 135–145)

## 2018-08-06 LAB — T4, FREE: Free T4: 0.84 ng/dL (ref 0.60–1.60)

## 2018-08-06 LAB — TSH: TSH: 1.26 u[IU]/mL (ref 0.35–4.50)

## 2018-08-06 MED ORDER — HYDROCORTISONE 10 MG PO TABS: ORAL_TABLET | ORAL | 3 refills | Status: AC

## 2018-08-06 NOTE — Patient Instructions (Signed)
MEDICATIONS:   - Hydrocortisone 15 mg every morning - Hydrocortisone 5 mg every afternoon ( Between 2-4 pm) - Continue Florinef at half a tablet daily       ADRENAL INSUFFICIENCY SICK DAY RULES:  Should you face an extreme emotional or physical stress such as trauma, surgery or acute illness, this will require extra steroid coverage so that the body can meet that stress.   Without increasing the steroid dose you may experience severe weakness, headache, dizziness, nausea and vomiting and possibly a more serious deterioration in health.  Typically the dose of steroids will only need to be increased for a couple of days if you have an illness that is transient and managed in the community.   If you are unable to take/absorb an increased dose of steroids orally because of vomiting or diarrhea, you will urgently require steroid injections and should present to an Emergency Department.  The general advice for any serious illness is as follows: 1. Double the normal daily steroid dose for up to 3 days if you have a temperature of more than 37.50C (99.74F) with signs of sickness, or severe emotional or physical distress 2. Contact your primary care doctor and Endocrinologist if the illness worsens or it lasts for more than 3 days.  3. In cases of severe illness, urgent medical assistance should be promptly sought. 4. If you experience vomiting/diarrhea or are unable to take steroids by mouth, please administer the Hydrocortisone injection kit and seek urgent medical help.

## 2018-08-06 NOTE — Progress Notes (Signed)
Name: Denise Burns  MRN/ DOB: [id number], 06/27/1971    Age/ Sex: 48 y.o., female    PCP: Denise Beck, FNP   Reason for Endocrinology Evaluation: Addison's Disease      Date of Initial Endocrinology Evaluation: 08/06/2018     HPI: Denise Burns is a 48 y.o. female with a past medical history of  Addison's Disease, premature ovarian  failure . The patient presented for initial endocrinology clinic visit on 08/06/2018 for consultative assistance with her adrenal insufficiency   She was diagnosed with addison's disease in 2006. She presented with weight loss, hyperpigmentation, and  hypotension. She did not have cosyntropin stim test. She has been on [facility] and Florinef since her diagnosis.    Takes HC 10 mg BID (8 am and 5 pm) Florinef 0.5 tab daily   Premature ovarian failure ~ 3 yrs ago. She did have a postmenopausal bleed between 05/2018- 06/2018  No FH of premature ovarian failure or autoimmune disease.    HISTORY:  Past Medical History:  Past Medical History:  Diagnosis Date  . Addison disease ([place])   . Arthritis   . GERD (gastroesophageal reflux disease)     Past Surgical History:  Past Surgical History:  Procedure Laterality Date  . CHOLECYSTECTOMY    . polyectomy   2011      Social History:  reports that she has quit smoking. She has never used smokeless tobacco. She reports current alcohol use. She reports that she does not use drugs.  Family History: family history includes Alcohol abuse in her father; Arthritis in her mother; Hearing loss in her father; Hyperlipidemia in her father and mother; Hypertension in her mother.   HOME MEDICATIONS: Allergies as of 08/06/2018      Reactions   Lisinopril Cough   Ultram [tramadol] Other (See Comments)   Sulfa Antibiotics Other (See Comments)      Medication List       Accurate as of August 06, 2018  7:38 AM. Always use your most recent med list.        baclofen 10 MG tablet Commonly known as:   LIORESAL   fexofenadine 180 MG tablet Commonly known as:  ALLEGRA Take 180 mg by mouth daily.   fludrocortisone 0.1 MG tablet Commonly known as:  FLORINEF   HYDROcodone-acetaminophen 7.5-325 MG tablet Commonly known as:  NORCO TK 1 T PO TID PRF PAIN  DNF 04/22/2018   hydrocortisone 10 MG tablet Commonly known as:  CORTEF   pseudoephedrine 30 MG tablet Commonly known as:  SUDAFED Take 30 mg by mouth every 4 (four) hours as needed for congestion.   Vitamin D3 1.25 MG (50000 UT) Tabs Take 1 tablet by mouth every 7 (seven) days.         REVIEW OF SYSTEMS: A comprehensive ROS was conducted with the patient and is negative except as per HPI and below:  Review of Systems  Constitutional: Positive for malaise/fatigue. Negative for weight loss.  HENT: Positive for congestion. Negative for sore throat.   Eyes: Negative for blurred vision and pain.  Respiratory: Negative for cough and shortness of breath.   Cardiovascular: Negative for chest pain and palpitations.  Gastrointestinal: Negative for diarrhea and nausea.  Genitourinary: Negative for frequency.  Skin: Negative.   Neurological: Negative for tingling and tremors.  Endo/Heme/Allergies: Negative for polydipsia.  Psychiatric/Behavioral: Negative for depression. The patient is not nervous/anxious.        OBJECTIVE:  VS: BP 138/78 (BP Location: Left  Arm, Patient Position: Sitting, Cuff Size: Normal)   Pulse 93   Ht 5' 3.25" (1.607 m)   Wt 193 lb 6.4 oz (87.7 kg)   LMP 06/08/2018 (Exact Date)   SpO2 94%   BMI 33.99 kg/m      Body surface area is 1.98 meters squared.  Wt Readings from Last 3 Encounters:  08/06/18 193 lb 6.4 oz (87.7 kg)  06/18/18 187 lb (84.8 kg)  06/15/18 197 lb (89.4 kg)     EXAM: General: Pt appears well and is in NAD  Hydration: Well-hydrated with moist mucous membranes and good skin turgor  Eyes: External eye exam normal without stare, lid lag or exophthalmos.  EOM intact.  PERRL.   Ears, Nose, Throat: Hearing: Grossly intact bilaterally Dental: Good dentition  Throat: Clear without mass, erythema or exudate  Neck: General: Supple without adenopathy. Thyroid: Thyroid size normal.  No goiter or nodules appreciated. No thyroid bruit.  Lungs: Clear with good BS bilat with no rales, rhonchi, or wheezes  Heart: Auscultation: RRR.  Abdomen: Normoactive bowel sounds, soft, nontender, without masses or organomegaly palpable  Extremities: Gait and station: Normal gait  Digits and nails: No clubbing, cyanosis, petechiae, or nodes Head and neck: Normal alignment and mobility BL UE: Normal ROM and strength. BL LE: No pretibial edema normal ROM and strength.  Skin: Hair: Texture and amount normal with gender appropriate distribution Skin Inspection: No rashes, acanthosis nigricans/skin tags.  Skin Palpation: Skin temperature, texture, and thickness normal to palpation  Neuro: Cranial nerves: II - XII grossly intact  Motor: Normal strength throughout DTRs: 2+ and symmetric in UE without delay in relaxation phase  Mental Status: Judgment, insight: Intact Orientation: Oriented to time, place, and person Mood and affect: No depression, anxiety, or agitation     DATA REVIEWED:   Results for Denise, Burns (MRN [id number]) as of 08/06/2018 12:16  Ref. Range 08/06/2018 88:50  BASIC METABOLIC PANEL Unknown Rpt (A)  Sodium Latest Ref Range: 135 - 145 mEq/L 139  Potassium Latest Ref Range: 3.5 - 5.1 mEq/L 4.0  Chloride Latest Ref Range: 96 - 112 mEq/L 104  CO2 Latest Ref Range: 19 - 32 mEq/L 26  Glucose Latest Ref Range: 70 - 99 mg/dL 112 (H)  BUN Latest Ref Range: 6 - 23 mg/dL 16  Creatinine Latest Ref Range: 0.40 - 1.20 mg/dL 0.87  Calcium Latest Ref Range: 8.4 - 10.5 mg/dL 10.4  GFR Latest Ref Range: >60.00 mL/min 73.91  TSH Latest Ref Range: 0.35 - 4.50 uIU/mL 1.26  T4,Free(Direct) Latest Ref Range: 0.60 - 1.60 ng/dL 0.84    ASSESSMENT/PLAN/RECOMMENDATIONS:   1. Primary  Adrenal Insufficiency (Addison's Disease)   - Pt is asymptomatic, based on her BSA she is on appropriate amount of Hydrocortisone. The only change I will make today is give her 2/3rd of the dose in the morning and 1/3 of the dose in the late afternoon.  - Reviewed the sick day rule - She already wears a medical alert bracelet. - BMP and TFT's are normal on today's labs.   Medications : - Hydrocortisone 15 mg QAM - Hydrocortisone 5 mh QPM (between 2-4 pm) - Continue Fludrocortisone  0.1 mg , at half a tablet daily    2. Premature Ovarian Failure :  - Pt is establishing with a new Gyn - Vaginal bleeding has ceased - Discussed bone health and the need to make sure she is consuming 2-3 servings of calcium daily  She may start an MVI , as  she is having peeling of her finger tips.    F/U in 6 months    Signed electronically by: Mack Guise, MD  [facility]  [facility] [address] [place], [place] [address] Phone: [phone] FAX: [phone]   CC: Denise Burns, [place] [address] Phone: [phone] Fax: [phone]   Return to Endocrinology clinic as below: Future Appointments  Date Time Provider [place]  08/16/2018 10:00 AM Vevelyn Francois, NP [facility] None

## 2018-08-09 MED FILL — HYDROCORTISONE 10 MG TABLET: 10 | 90 days supply | Qty: 180 | Fill #0

## 2018-08-09 MED FILL — BACLOFEN 10 MG TABS: 10 | 30 days supply | Qty: 45 | Fill #1

## 2018-08-14 ENCOUNTER — Ambulatory Visit: Payer: Self-pay | Admitting: Nurse Practitioner

## 2018-08-16 ENCOUNTER — Ambulatory Visit: Payer: No Typology Code available for payment source | Admitting: Nurse Practitioner

## 2018-08-16 ENCOUNTER — Encounter: Payer: Self-pay | Admitting: Nurse Practitioner

## 2018-08-16 ENCOUNTER — Ambulatory Visit
Admission: RE | Admit: 2018-08-16 | Discharge: 2018-08-16 | Disposition: A | Discharge status: Home / Self Care | Payer: No Typology Code available for payment source | Source: Ambulatory Visit | Attending: Nurse Practitioner | Admitting: Nurse Practitioner

## 2018-08-16 ENCOUNTER — Other Ambulatory Visit: Payer: Self-pay

## 2018-08-16 VITALS — BP 130/90 | HR 101 | Temp 98.1°F | Ht 64.0 in | Wt 190.0 lb

## 2018-08-16 DIAGNOSIS — G8929 Other chronic pain: Secondary | ICD-10-CM | POA: Diagnosis present

## 2018-08-16 DIAGNOSIS — G894 Chronic pain syndrome: Secondary | ICD-10-CM | POA: Insufficient documentation

## 2018-08-16 DIAGNOSIS — M533 Sacrococcygeal disorders, not elsewhere classified: Principal | ICD-10-CM

## 2018-08-16 DIAGNOSIS — Z79899 Other long term (current) drug therapy: Secondary | ICD-10-CM | POA: Insufficient documentation

## 2018-08-16 DIAGNOSIS — M899 Disorder of bone, unspecified: Secondary | ICD-10-CM | POA: Insufficient documentation

## 2018-08-16 DIAGNOSIS — Z789 Other specified health status: Secondary | ICD-10-CM | POA: Insufficient documentation

## 2018-08-16 DIAGNOSIS — Z79891 Long term (current) use of opiate analgesic: Secondary | ICD-10-CM | POA: Insufficient documentation

## 2018-08-16 DIAGNOSIS — M545 Low back pain: Secondary | ICD-10-CM | POA: Insufficient documentation

## 2018-08-16 NOTE — Progress Notes (Signed)
Patient's Name: Denise Burns  MRN: [id number]  Referring Provider: Elby Beck, FNP  DOB: 08/14/1970  PCP: Elby Beck, FNP  DOS: 08/16/2018  Note by: Dionisio David NP  Service setting: Ambulatory outpatient  Specialty: Interventional Pain Management  Location: [facility] (AMB) Pain Management Facility    Patient type: New Patient    Primary Reason(s) for Visit: Initial Patient Evaluation CC: Back Pain  HPI  Denise Burns is a 48 y.o. year old, female patient, who comes today for an initial evaluation. She has Addison's disease ([place]); Class 1 obesity due to excess calories without serious comorbidity with body mass index (BMI) of 31.0 to 31.9 in adult; Chronic midline low back pain with bilateral sciatica; Premature ovarian failure; Chronic bilateral low back pain without sciatica (Primary Area of Pain) (R>L); Chronic right sacroiliac joint pain (Secondary Area of Pain); Chronic pain syndrome; Long term current use of opiate analgesic; Pharmacologic therapy; Disorder of skeletal system; and Problems influencing health status on their problem list.. Her primarily concern today is the Back Pain  Pain Assessment: Location: Lower, Right Back(right hip) Radiating: denies Onset: More than a month ago Duration: Chronic pain Quality: Aching, Constant Severity: 4 /10 (subjective, self-reported pain score)  Note: Reported level is compatible with observation. Clinically the patient looks like a 0/10       Information on the proper use of the pain scale provided to the patient today.  Effect on ADL: take a lot of breaks Timing: Constant Modifying factors: medications,heating pad, warm bath BP: 130/90  HR: (!) 101  Onset and Duration: Gradual and Date of onset: more than 10 years Cause of pain: Unknown Severity: NAS-11 at its worse: 7/10, NAS-11 at its best: 3/10, NAS-11 now: 4/10 and NAS-11 on the average: 4/10 Timing: Night, During activity or exercise and After activity or  exercise Aggravating Factors: Lifiting, Prolonged standing and Walking uphill Alleviating Factors: Stretching, Hot packs, Lying down, Medications, Resting, Sleeping and Warm showers or baths Associated Problems: Fatigue, Spasms, Tingling and Weakness Quality of Pain: Aching, Constant and Uncomfortable Previous Examinations or Tests: MRI scan Previous Treatments: Epidural steroid injections, Narcotic medications and Physical Therapy  The patient comes into the clinics today for the first time for a chronic pain management evaluation.  According to the patient her primary area of pain is in her lower back.  She denies any precipitating factors.  She admits the right side is greater than the left.  She denies any previous surgery.  She has had epidural steroid injections and sacroiliac joint injections last being in September 2019.  She admits that they were effective.  She has seen sports medicine November 2019 and continues to do stretching exercises.  She had an MRI August 2019.  Today I took the time to provide the patient with information regarding this pain practice. The patient was informed that the practice is divided into two sections: an interventional pain management section, as well as a completely separate and distinct medication management section. I explained that there are procedure days for interventional therapies, and evaluation days for follow-ups and medication management. Because of the amount of documentation required during both, they are kept separated. This means that there is the possibility that she may be scheduled for a procedure on one day, and medication management the next. I have also informed her that because of staffing and facility limitations, this practice will no longer take patients for medication management only. To illustrate the reasons for this, I gave the patient  the example of surgeons, and how inappropriate it would be to refer a patient to his/her care, just to  write for the post-surgical antibiotics on a surgery done by a different surgeon.   Because interventional pain management is part of the board-certified specialty for the doctors, the patient was informed that joining this practice means that they are open to any and all interventional therapies. I made it clear that this does not mean that they will be forced to have any procedures done. What this means is that I believe interventional therapies to be essential part of the diagnosis and proper management of chronic pain conditions. Therefore, patients not interested in these interventional alternatives will be better served under the care of a different practitioner.  The patient was also made aware of my Comprehensive Pain Management Safety Guidelines where by joining this practice, they limit all of their nerve blocks and joint injections to those done by our practice, for as long as we are retained to manage their care. Historic Controlled Substance Pharmacotherapy Review  PMP and historical list of controlled substances: Hydrocodone/acetaminophen 7.5/325, hydrocodone homo-atropine syrup, oxycodone/acetaminophen 10/325 mg, Vicodin 5/300 mg, hydrocodone/acetaminophen 5/325 mg, tramadol 50 mg, Hydromet syrup, hydrocodone/Chlorphen ER suspension, carisoprodol 350 mg, hydrocodone/acetaminophen 5/500 mg, Highest opioid analgesic regimen found: Oxycodone/acetaminophen 10/325 mg, 1 tablet every 4 hours (fill date 09/01/2014) oxycodone 60 mg/day Most recent opioid analgesic: Codon/acetaminophen 7.5/325 mg 1 tablet 3 times daily (last fill date 05/22/2018) hydrocodone 22.5 mg/day Current opioid analgesics: None Highest recorded MME/day: 90 mg/day MME/day: 0 mg/day Medications: The patient did not bring the medication(s) to the appointment, as requested in our "New Patient Package" Pharmacodynamics: Desired effects: Analgesia: The patient reports >50% benefit. Reported improvement in function: The patient  reports medication allows her to accomplish basic ADLs. Clinically meaningful improvement in function (CMIF): Sustained CMIF goals met Perceived effectiveness: Described as relatively effective, allowing for increase in activities of daily living (ADL) Undesirable effects: Side-effects or Adverse reactions: None reported Historical Monitoring: The patient  reports no history of drug use. List of all UDS Test(s): No results found for: MDMA, COCAINSCRNUR, PCPSCRNUR, PCPQUANT, CANNABQUANT, THCU, [place] List of all Serum Drug Screening Test(s):  No results found for: AMPHSCRSER, BARBSCRSER, BENZOSCRSER, COCAINSCRSER, PCPSCRSER, PCPQUANT, THCSCRSER, CANNABQUANT, OPIATESCRSER, OXYSCRSER, PROPOXSCRSER Historical Background Evaluation: [place] PDMP: Six (6) year initial data search conducted.       A pattern of multiple prescribers and multiple pharmacies was identified [place] Department of public safety, offender search: Editor, commissioning Information) Non-contributory Risk Assessment Profile: Aberrant behavior: None observed or detected today Risk factors for fatal opioid overdose: None identified today Fatal overdose hazard ratio (HR): Calculation deferred Non-fatal overdose hazard ratio (HR): Calculation deferred Risk of opioid abuse or dependence: 0.7-3.0% with doses ? 36 MME/day and 6.1-26% with doses ? 120 MME/day. Substance use disorder (SUD) risk level: Pending results of Medical Psychology Evaluation for SUD Opioid risk tool (ORT) 50 mg score): 1  ORT Scoring interpretation table:  Score <3 = Low Risk for SUD  Score between 4-7 = Moderate Risk for SUD  Score >8 = High Risk for Opioid Abuse   PHQ-2 Depression Scale:  Total score:    PHQ-2 Scoring interpretation table: (Score and probability of major depressive disorder)  Score 0 = No depression  Score 1 = 15.4% Probability  Score 2 = 21.1% Probability  Score 3 = 38.4% Probability  Score 4 = 45.5% Probability  Score 5 = 56.4% Probability  Score 6 =  78.6% Probability  PHQ-9 Depression Scale:  Total score:    PHQ-9 Scoring interpretation table:  Score 0-4 = No depression  Score 5-9 = Mild depression  Score 10-14 = Moderate depression  Score 15-19 = Moderately severe depression  Score 20-27 = Severe depression (2.4 times higher risk of SUD and 2.89 times higher risk of overuse)   Pharmacologic Plan: Pending ordered tests and/or consults  Meds  The patient has a current medication list which includes the following prescription(s): baclofen, vitamin d3, fexofenadine, fludrocortisone, hydrocodone-acetaminophen, hydrocortisone, and pseudoephedrine.  Current Outpatient Medications on File Prior to Visit  Medication Sig  . baclofen (LIORESAL) 10 MG tablet   . Cholecalciferol (VITAMIN D3) 50000 units TABS Take 1 tablet by mouth every 7 (seven) days.  . fexofenadine (ALLEGRA) 180 MG tablet Take 180 mg by mouth daily.  . fludrocortisone (FLORINEF) 0.1 MG tablet Take 0.05 mg by mouth daily.  Marland Kitchen HYDROcodone-acetaminophen (NORCO) 7.5-325 MG tablet TK 1 T PO TID PRF PAIN  DNF 04/22/2018  . hydrocortisone (CORTEF) 10 MG tablet Take 1.5 tablets (15 mg total) by mouth daily with breakfast AND 0.5 tablets (5 mg total) every evening.  . pseudoephedrine (SUDAFED) 30 MG tablet Take 30 mg by mouth every 4 (four) hours as needed for congestion.   No current facility-administered medications on file prior to visit.    Imaging Review  Lumbosacral Imaging: Lumbar MR wo contrast:  Results for orders placed during the hospital encounter of 03/22/18  MR LUMBAR SPINE WO CONTRAST   Narrative CLINICAL DATA:  Initial evaluation for chronic low back pain radiating into the left lower extremity.  EXAM: MRI LUMBAR SPINE WITHOUT CONTRAST  TECHNIQUE: Multiplanar, multisequence MR imaging of the lumbar spine was performed. No intravenous contrast was administered.  COMPARISON:  None available.  FINDINGS: Segmentation: Normal segmentation. Lowest  well-formed disc labeled the L5-S1 level.  Alignment: Chronic bilateral pars defects at L5 with associated 8 mm spondylolisthesis of L5 on S1. Trace 2 mm retrolisthesis of L2 on L3. Alignment otherwise normal.  Vertebrae: Vertebral body heights well maintained without evidence for acute or chronic fracture. Bone marrow signal intensity within normal limits. Well-circumscribed 2.3 cm T2 hyperintense lesion within the L3 vertebral body demonstrates internal trabeculation, felt to be most consistent with an atypical hemangioma. No other discrete or worrisome osseous lesions. Mild reactive endplate changes noted about the T11-12 interspace anteriorly.  Conus medullaris and cauda equina: Conus extends to the L1 level. Conus and cauda equina appear normal.  Paraspinal and other soft tissues: Paraspinous soft tissues within normal limits. Visualized visceral structures unremarkable.  Disc levels:  T11-12: Seen only on sagittal projection. Diffuse disc bulge with disc desiccation and intervertebral disc space narrowing. Mild reactive endplate changes. No stenosis.  T12-L1: Unremarkable.  L1-2: Tiny central disc protrusion indents the ventral thecal sac. Mild facet hypertrophy. No stenosis.  L2-3: Trace retrolisthesis. Minimal annular disc bulge with disc desiccation. Mild facet hypertrophy. No significant spinal stenosis. Mild left L2 foraminal narrowing.  L3-4: Disc desiccation with minimal bulge. Mild facet hypertrophy. No stenosis.  L4-5: Negative interspace. Moderate facet hypertrophy. No stenosis.  L5-S1: Chronic bilateral pars defects with 8 mm spondylolisthesis. Associated broad posterior pseudo disc bulge/uncovering. Central canal remains widely patent. Moderate to advanced bilateral L5 foraminal stenosis, potentially affecting either of the L5 nerve roots.  IMPRESSION: 1. Chronic bilateral pars defects at L5 with associated 8 mm spondylolisthesis and resultant  moderate bilateral L5 foraminal stenosis. Either of the L5 nerve roots could be affected. 2. Minor disc  bulge with facet hypertrophy at L2-3 with resultant mild left L2 foraminal narrowing. 3. Additional minor degenerative changes elsewhere within the lumbar spine without significant stenosis or impingement.   Electronically Signed   By: Jeannine Boga M.D.   On: 03/22/2018 21:51   Note: Available results from prior imaging studies were reviewed.        ROS  Cardiovascular History: No reported cardiovascular signs or symptoms such as High blood pressure, coronary artery disease, abnormal heart rate or rhythm, heart attack, blood thinner therapy or heart weakness and/or failure Pulmonary or Respiratory History: Snoring  and Temporary stoppage of breathing during sleep Neurological History: No reported neurological signs or symptoms such as seizures, abnormal skin sensations, urinary and/or fecal incontinence, being born with an abnormal open spine and/or a tethered spinal cord Review of Past Neurological Studies: No results found for this or any previous visit. Psychological-Psychiatric History: No reported psychological or psychiatric signs or symptoms such as difficulty sleeping, anxiety, depression, delusions or hallucinations (schizophrenial), mood swings (bipolar disorders) or suicidal ideations or attempts Gastrointestinal History: Reflux or heatburn Genitourinary History: No reported renal or genitourinary signs or symptoms such as difficulty voiding or producing urine, peeing blood, non-functioning kidney, kidney stones, difficulty emptying the bladder, difficulty controlling the flow of urine, or chronic kidney disease Hematological History: No reported hematological signs or symptoms such as prolonged bleeding, low or poor functioning platelets, bruising or bleeding easily, hereditary bleeding problems, low energy levels due to low hemoglobin or being anemic Endocrine History:  No reported endocrine signs or symptoms such as high or low blood sugar, rapid heart rate due to high thyroid levels, obesity or weight gain due to slow thyroid or thyroid disease Rheumatologic History: Joint aches and or swelling due to excess weight (Osteoarthritis) and No reported rheumatological signs and symptoms such as fatigue, joint pain, tenderness, swelling, redness, heat, stiffness, decreased range of motion, with or without associated rash Musculoskeletal History: Negative for myasthenia gravis, muscular dystrophy, multiple sclerosis or malignant hyperthermia Work History: Working full time  Allergies  Denise Burns is allergic to lisinopril; ultram [tramadol]; and sulfa antibiotics.  Laboratory Chemistry  Inflammation Markers No results found for: CRP, ESRSEDRATE (CRP: Acute Phase) (ESR: Chronic Phase) Renal Function Markers Lab Results  Component Value Date   BUN 16 08/06/2018   CREATININE 0.87 08/06/2018   Hepatic Function Markers No results found for: AST, ALT, ALBUMIN, ALKPHOS, HCVAB Electrolytes Lab Results  Component Value Date   NA 139 08/06/2018   K 4.0 08/06/2018   CL 104 08/06/2018   CALCIUM 10.4 08/06/2018   Neuropathy Markers No results found for: [id number] Bone Pathology Markers Lab Results  Component Value Date   VD25OH 13.55 (L) 04/30/2018   CALCIUM 10.4 08/06/2018   Coagulation Parameters No results found for: INR, LABPROT, APTT, PLT Cardiovascular Markers No results found for: BNP, HGB, HCT Note: Lab results reviewed.  [place]  Drug: Denise Burns  reports no history of drug use. Alcohol:  reports current alcohol use. Tobacco:  reports that she has quit smoking. She has never used smokeless tobacco. Medical:  has a past medical history of Addison disease ([place]), Arthritis, and GERD (gastroesophageal reflux disease). Family: family history includes Alcohol abuse in her father; Arthritis in her mother; Hearing loss in her father; Hyperlipidemia in  her father and mother; Hypertension in her mother.  Past Surgical History:  Procedure Laterality Date  . CHOLECYSTECTOMY    . polyectomy   2011   Active Ambulatory Problems  Diagnosis Date Noted  . Addison's disease ([place]) 04/30/2018  . Class 1 obesity due to excess calories without serious comorbidity with body mass index (BMI) of 31.0 to 31.9 in adult 04/30/2018  . Chronic midline low back pain with bilateral sciatica 04/30/2018  . Premature ovarian failure 08/06/2018  . Chronic bilateral low back pain without sciatica (Primary Area of Pain) (R>L) 08/16/2018  . Chronic right sacroiliac joint pain (Secondary Area of Pain) 08/16/2018  . Chronic pain syndrome 08/16/2018  . Long term current use of opiate analgesic 08/16/2018  . Pharmacologic therapy 08/16/2018  . Disorder of skeletal system 08/16/2018  . Problems influencing health status 08/16/2018   Resolved Ambulatory Problems    Diagnosis Date Noted  . No Resolved Ambulatory Problems   Past Medical History:  Diagnosis Date  . Addison disease ([place])   . Arthritis   . GERD (gastroesophageal reflux disease)    Constitutional Exam  General appearance: Well nourished, well developed, and well hydrated. In no apparent acute distress Vitals:   08/16/18 1006  BP: 130/90  Pulse: (!) 101  Temp: 98.1 F (36.7 C)  SpO2: 100%  Weight: 190 lb (86.2 kg)  Height: 5' 4"  (1.626 m)   BMI Assessment: Estimated body mass index is 32.61 kg/m as calculated from the following:   Height as of this encounter: 5' 4"  (1.626 m).   Weight as of this encounter: 190 lb (86.2 kg).  BMI interpretation table: BMI level Category Range association with higher incidence of chronic pain  <18 kg/m2 Underweight   18.5-24.9 kg/m2 Ideal body weight   25-29.9 kg/m2 Overweight Increased incidence by 20%  30-34.9 kg/m2 Obese (Class I) Increased incidence by 68%  35-39.9 kg/m2 Severe obesity (Class II) Increased incidence by 136%  >40 kg/m2 Extreme  obesity (Class III) Increased incidence by 254%   BMI Readings from Last 4 Encounters:  08/16/18 32.61 kg/m  08/06/18 33.99 kg/m  06/18/18 32.86 kg/m  06/15/18 34.62 kg/m   Wt Readings from Last 4 Encounters:  08/16/18 190 lb (86.2 kg)  08/06/18 193 lb 6.4 oz (87.7 kg)  06/18/18 187 lb (84.8 kg)  06/15/18 197 lb (89.4 kg)  Psych/Mental status: Alert, oriented x 3 (person, place, & time)       Eyes: PERLA Respiratory: No evidence of acute respiratory distress  Cervical Spine Exam  Inspection: No masses, redness, or swelling Alignment: Symmetrical Functional ROM: Unrestricted ROM      Stability: No instability detected Muscle strength & Tone: Functionally intact Sensory: Unimpaired Palpation: No palpable anomalies              Upper Extremity (UE) Exam    Side: Right upper extremity  Side: Left upper extremity  Inspection: No masses, redness, swelling, or asymmetry. No contractures  Inspection: No masses, redness, swelling, or asymmetry. No contractures  Functional ROM: Unrestricted ROM          Functional ROM: Unrestricted ROM          Muscle strength & Tone: Functionally intact  Muscle strength & Tone: Functionally intact  Sensory: Unimpaired  Sensory: Unimpaired  Palpation: No palpable anomalies              Palpation: No palpable anomalies              Specialized Test(s): Deferred         Specialized Test(s): Deferred          Thoracic Spine Exam  Inspection: No masses, redness, or swelling Alignment: Symmetrical Functional  ROM: Unrestricted ROM Stability: No instability detected Sensory: Unimpaired Muscle strength & Tone: No palpable anomalies  Lumbar Spine Exam  Inspection: No masses, redness, or swelling Alignment: Symmetrical Functional ROM: Unrestricted ROM      Stability: No instability detected Muscle strength & Tone: Functionally intact Sensory: Unimpaired Palpation: Non-tender       Provocative Tests: Lumbar Hyperextension and rotation test:  Non-contributory       Patrick's Maneuver: Positive for right-sided S-I arthralgia              Gait & Posture Assessment  Ambulation: Unassisted Gait: Relatively normal for age and body habitus Posture: WNL   Lower Extremity Exam    Side: Right lower extremity  Side: Left lower extremity  Inspection: No masses, redness, swelling, or asymmetry. No contractures  Inspection: No masses, redness, swelling, or asymmetry. No contractures  Functional ROM: Unrestricted ROM          Functional ROM: Unrestricted ROM          Muscle strength & Tone: Able to Toe-walk & Heel-walk without problems  Muscle strength & Tone: Able to Toe-walk & Heel-walk without problems  Sensory: Unimpaired  Sensory: Unimpaired  Palpation: No palpable anomalies  Palpation: No palpable anomalies   Assessment  Primary Diagnosis & Pertinent Problem List: The primary encounter diagnosis was Chronic bilateral low back pain without sciatica (Primary Area of Pain) (R>L). Diagnoses of Chronic right sacroiliac joint pain (Secondary Area of Pain), Chronic pain syndrome, Long term current use of opiate analgesic, Pharmacologic therapy, Disorder of skeletal system, and Problems influencing health status were also pertinent to this visit.  Visit Diagnosis: 1. Chronic bilateral low back pain without sciatica (Primary Area of Pain) (R>L)   2. Chronic right sacroiliac joint pain (Secondary Area of Pain)   3. Chronic pain syndrome   4. Long term current use of opiate analgesic   5. Pharmacologic therapy   6. Disorder of skeletal system   7. Problems influencing health status    Plan of Care  Initial treatment plan:  Please be advised that as per protocol, today's visit has been an evaluation only. We have not taken over the patient's controlled substance management.  Problem-specific plan: No problem-specific Assessment & Plan notes found for this encounter.  Ordered Lab-work, Procedure(s), Referral(s), & Consult(s): Orders  Placed This Encounter  Procedures  . DG Si Joints  . Compliance Drug Analysis, Ur  . Comp. Metabolic Panel (12)  . Magnesium  . Vitamin B12  . Sedimentation rate  . 25-Hydroxyvitamin D Lcms D2+D3  . C-reactive protein   Pharmacotherapy: Medications ordered:  No orders of the defined types were placed in this encounter.  Medications administered during this visit: Denise Burns had no medications administered during this visit.   Pharmacotherapy under consideration:  Opioid Analgesics: The patient was informed that there is no guarantee that she would be a candidate for opioid analgesics. The decision will be made following CDC guidelines. This decision will be based on the results of diagnostic studies, as well as Denise Burns's risk profile.  Membrane stabilizer: To be determined at a later time Muscle relaxant: To be determined at a later time NSAID: To be determined at a later time Other analgesic(s): To be determined at a later time   Interventional therapies under consideration: Denise Burns was informed that there is no guarantee that she would be a candidate for interventional therapies. The decision will be based on the results of diagnostic studies, as well as Ms.  Burns's risk profile.  Possible procedure(s): Diagnostic right-sided sacroiliac joint injection Possible right-sided sacroiliac joint radiofrequency ablation Diagnostic bilateral lumbar facet nerve block Possible bilateral lumbar facet radiofrequency ablation   Provider-requested follow-up: Return for 2nd Visit, w/ Dr. Dossie Arbour, medical record release.  Future Appointments  Date Time Provider [place]  02/05/2019  7:30 AM Shamleffer, Melanie Crazier, MD [id number] None    Primary Care Physician: Elby Beck, FNP Location: [facility] Facility Note by:  Date: 08/16/2018; Time: 10:47 AM  Pain Score Disclaimer: We use the NRS-11 scale. This is a self-reported,  subjective measurement of pain severity with only modest accuracy. It is used primarily to identify changes within a particular patient. It must be understood that outpatient pain scales are significantly less accurate that those used for research, where they can be applied under ideal controlled circumstances with minimal exposure to variables. In reality, the score is likely to be a combination of pain intensity and pain affect, where pain affect describes the degree of emotional arousal or changes in action readiness caused by the sensory experience of pain. Factors such as social and work situation, setting, emotional state, anxiety levels, expectation, and prior pain experience may influence pain perception and show large inter-individual differences that may also be affected by time variables.  Patient instructions provided during this appointment: Patient Instructions   ____________________________________________________________________________________________  Appointment Policy Summary  It is our goal and responsibility to provide the medical community with assistance in the evaluation and management of patients with chronic pain. Unfortunately our resources are limited. Because we do not have an unlimited amount of time, or available appointments, we are required to closely monitor and manage their use. The following rules exist to maximize their use:  Patient's responsibilities: 1. Punctuality:  At what time should I arrive? You should be physically present in our office 30 minutes before your scheduled appointment. Your scheduled appointment is with your assigned healthcare provider. However, it takes 5-10 minutes to be "checked-in", and another 15 minutes for the nurses to do the admission. If you arrive to our office at the time you were given for your appointment, you will end up being at least 20-25 minutes late to your appointment with the provider. 2. Tardiness:  What happens if I  arrive only a few minutes after my scheduled appointment time? You will need to reschedule your appointment. The cutoff is your appointment time. This is why it is so important that you arrive at least 30 minutes before that appointment. If you have an appointment scheduled for 10:00 AM and you arrive at 10:01, you will be required to reschedule your appointment.  3. Plan ahead:  Always assume that you will encounter traffic on your way in. Plan for it. If you are dependent on a driver, make sure they understand these rules and the need to arrive early. 4. Other appointments and responsibilities:  Avoid scheduling any other appointments before or after your pain clinic appointments.  5. Be prepared:  Write down everything that you need to discuss with your healthcare provider and give this information to the admitting nurse. Write down the medications that you will need refilled. Bring your pills and bottles (even the empty ones), to all of your appointments, except for those where a procedure is scheduled. 6. No children or pets:  Find someone to take care of them. It is not appropriate to bring them in. 7. Scheduling changes:  We request "advanced notification" of any changes or cancellations.  8. Advanced notification:  Defined as a time period of more than 24 hours prior to the originally scheduled appointment. This allows for the appointment to be offered to other patients. 9. Rescheduling:  When a visit is rescheduled, it will require the cancellation of the original appointment. For this reason they both fall within the category of "Cancellations".  10. Cancellations:  They require advanced notification. Any cancellation less than 24 hours before the  appointment will be recorded as a "No Show". 11. No Show:  Defined as an unkept appointment where the patient failed to notify or declare to the practice their intention or inability to keep the appointment.  Corrective process for repeat  offenders:  1. Tardiness: Three (3) episodes of rescheduling due to late arrivals will be recorded as one (1) "No Show". 2. Cancellation or reschedule: Three (3) cancellations or rescheduling will be recorded as one (1) "No Show". 3. "No Shows": Three (3) "No Shows" within a 12 month period will result in discharge from the practice. ____________________________________________________________________________________________   ______________________________________________________________________________________________  Specialty Pain Scale  Introduction:  There are significant differences in how pain is reported. The word pain usually refers to physical pain, but it is also a common synonym of suffering. The medical community uses a scale from 0 (zero) to 10 (ten) to report pain level. Zero (0) is described as "no pain", while ten (10) is described as "the worse pain you can imagine". The problem with this scale is that physical pain is reported along with suffering. Suffering refers to mental pain, or more often yet it refers to any unpleasant feeling, emotion or aversion associated with the perception of harm or threat of harm. It is the psychological component of pain.  Pain Specialists prefer to separate the two components. The pain scale used by this practice is the Verbal Numerical Rating Scale (VNRS-11). This scale is for the physical pain only. DO NOT INCLUDE how your pain psychologically affects you. This scale is for adults 12 years of age and older. It has 11 (eleven) levels. The 1st level is 0/10. This means: "right now, I have no pain". In the context of pain management, it also means: "right now, my physical pain is under control with the current therapy".  General Information:  The scale should reflect your current level of pain. Unless you are specifically asked for the level of your worst pain, or your average pain. If you are asked for one of these two, then it should be  understood that it is over the past 24 hours.  Levels 1 (one) through 5 (five) are described below, and can be treated as an outpatient. Ambulatory pain management facilities such as ours are more than adequate to treat these levels. Levels 6 (six) through 10 (ten) are also described below, however, these must be treated as a hospitalized patient. While levels 6 (six) and 7 (seven) may be evaluated at an urgent care facility, levels 8 (eight) through 10 (ten) constitute medical emergencies and as such, they belong in a hospital's emergency department. When having these levels (as described below), do not come to our office. Our facility is not equipped to manage these levels. Go directly to an urgent care facility or an emergency department to be evaluated.  Definitions:  Activities of Daily Living (ADL): Activities of daily living (ADL or ADLs) is a term used in healthcare to refer to people's daily self-care activities. Health professionals often use a person's ability or inability to perform ADLs as  a measurement of their functional status, particularly in regard to people post injury, with disabilities and the elderly. There are two ADL levels: Basic and Instrumental. Basic Activities of Daily Living (BADL  or BADLs) consist of self-care tasks that include: Bathing and showering; personal hygiene and grooming (including brushing/combing/styling hair); dressing; Toilet hygiene (getting to the toilet, cleaning oneself, and getting back up); eating and self-feeding (not including cooking or chewing and swallowing); functional mobility, often referred to as "transferring", as measured by the ability to walk, get in and out of bed, and get into and out of a chair; the broader definition (moving from one place to another while performing activities) is useful for people with different physical abilities who are still able to get around independently. Basic ADLs include the things many people do when they get up  in the morning and get ready to go out of the house: get out of bed, go to the toilet, bathe, dress, groom, and eat. On the average, loss of function typically follows a particular order. Hygiene is the first to go, followed by loss of toilet use and locomotion. The last to go is the ability to eat. When there is only one remaining area in which the person is independent, there is a 62.9% chance that it is eating and only a 3.5% chance that it is hygiene. Instrumental Activities of Daily Living (IADL or IADLs) are not necessary for fundamental functioning, but they let an individual live independently in a community. IADL consist of tasks that include: cleaning and maintaining the house; home establishment and maintenance; care of others (including selecting and supervising caregivers); care of pets; child rearing; managing money; managing financials (investments, etc.); meal preparation and cleanup; shopping for groceries and necessities; moving within the community; safety procedures and emergency responses; health management and maintenance (taking prescribed medications); and using the telephone or other form of communication.  Instructions:  Most patients tend to report their pain as a combination of two factors, their physical pain and their psychosocial pain. This last one is also known as "suffering" and it is reflection of how physical pain affects you socially and psychologically. From now on, report them separately.  From this point on, when asked to report your pain level, report only your physical pain. Use the following table for reference.  Pain Clinic Pain Levels (0-5/10)  Pain Level Score  Description  No Pain 0   Mild pain 1 Nagging, annoying, but does not interfere with basic activities of daily living (ADL). Patients are able to eat, bathe, get dressed, toileting (being able to get on and off the toilet and perform personal hygiene functions), transfer (move in and out of bed or a chair  without assistance), and maintain continence (able to control bladder and bowel functions). Blood pressure and heart rate are unaffected. A normal heart rate for a healthy adult ranges from 60 to 100 bpm (beats per minute).   Mild to moderate pain 2 Noticeable and distracting. Impossible to hide from other people. More frequent flare-ups. Still possible to adapt and function close to normal. It can be very annoying and may have occasional stronger flare-ups. With discipline, patients may get used to it and adapt.   Moderate pain 3 Interferes significantly with activities of daily living (ADL). It becomes difficult to feed, bathe, get dressed, get on and off the toilet or to perform personal hygiene functions. Difficult to get in and out of bed or a chair without assistance. Very distracting. With  effort, it can be ignored when deeply involved in activities.   Moderately severe pain 4 Impossible to ignore for more than a few minutes. With effort, patients may still be able to manage work or participate in some social activities. Very difficult to concentrate. Signs of autonomic nervous system discharge are evident: dilated pupils (mydriasis); mild sweating (diaphoresis); sleep interference. Heart rate becomes elevated (>115 bpm). Diastolic blood pressure (lower number) rises above 100 mmHg. Patients find relief in laying down and not moving.   Severe pain 5 Intense and extremely unpleasant. Associated with frowning face and frequent crying. Pain overwhelms the senses.  Ability to do any activity or maintain social relationships becomes significantly limited. Conversation becomes difficult. Pacing back and forth is common, as getting into a comfortable position is nearly impossible. Pain wakes you up from deep sleep. Physical signs will be obvious: pupillary dilation; increased sweating; goosebumps; brisk reflexes; cold, clammy hands and feet; nausea, vomiting or dry heaves; loss of appetite; significant  sleep disturbance with inability to fall asleep or to remain asleep. When persistent, significant weight loss is observed due to the complete loss of appetite and sleep deprivation.  Blood pressure and heart rate becomes significantly elevated. Caution: If elevated blood pressure triggers a pounding headache associated with blurred vision, then the patient should immediately seek attention at an urgent or emergency care unit, as these may be signs of an impending stroke.    Emergency Department Pain Levels (6-10/10)  Emergency Room Pain 6 Severely limiting. Requires emergency care and should not be seen or managed at an outpatient pain management facility. Communication becomes difficult and requires great effort. Assistance to reach the emergency department may be required. Facial flushing and profuse sweating along with potentially dangerous increases in heart rate and blood pressure will be evident.   Distressing pain 7 Self-care is very difficult. Assistance is required to transport, or use restroom. Assistance to reach the emergency department will be required. Tasks requiring coordination, such as bathing and getting dressed become very difficult.   Disabling pain 8 Self-care is no longer possible. At this level, pain is disabling. The individual is unable to do even the most "basic" activities such as walking, eating, bathing, dressing, transferring to a bed, or toileting. Fine motor skills are lost. It is difficult to think clearly.   Incapacitating pain 9 Pain becomes incapacitating. Thought processing is no longer possible. Difficult to remember your own name. Control of movement and coordination are lost.   The worst pain imaginable 10 At this level, most patients pass out from pain. When this level is reached, collapse of the autonomic nervous system occurs, leading to a sudden drop in blood pressure and heart rate. This in turn results in a temporary and dramatic drop in blood flow to the  brain, leading to a loss of consciousness. Fainting is one of the body's self defense mechanisms. Passing out puts the brain in a calmed state and causes it to shut down for a while, in order to begin the healing process.    Summary: 1. Refer to this scale when providing [place] with your pain level. 2. Be accurate and careful when reporting your pain level. This will help with your care. 3. Over-reporting your pain level will lead to loss of credibility. 4. Even a level of 1/10 means that there is pain and will be treated at our facility. 5. High, inaccurate reporting will be documented as "Symptom Exaggeration", leading to loss of credibility and suspicions of possible  secondary gains such as obtaining more narcotics, or wanting to appear disabled, for fraudulent reasons. 6. Only pain levels of 5 or below will be seen at our facility. 7. Pain levels of 6 and above will be sent to the Emergency Department and the appointment cancelled. ______________________________________________________________________________________________   BMI Assessment: Estimated body mass index is 32.61 kg/m as calculated from the following:   Height as of this encounter: 5' 4"  (1.626 m).   Weight as of this encounter: 190 lb (86.2 kg).  BMI interpretation table: BMI level Category Range association with higher incidence of chronic pain  <18 kg/m2 Underweight   18.5-24.9 kg/m2 Ideal body weight   25-29.9 kg/m2 Overweight Increased incidence by 20%  30-34.9 kg/m2 Obese (Class I) Increased incidence by 68%  35-39.9 kg/m2 Severe obesity (Class II) Increased incidence by 136%  >40 kg/m2 Extreme obesity (Class III) Increased incidence by 254%   BMI Readings from Last 4 Encounters:  08/16/18 32.61 kg/m  08/06/18 33.99 kg/m  06/18/18 32.86 kg/m  06/15/18 34.62 kg/m   Wt Readings from Last 4 Encounters:  08/16/18 190 lb (86.2 kg)  08/06/18 193 lb 6.4 oz (87.7 kg)  06/18/18 187 lb (84.8 kg)  06/15/18 197 lb  (89.4 kg)

## 2018-08-16 NOTE — Patient Instructions (Addendum)
____________________________________________________________________________________________  Appointment Policy Summary  It is our goal and responsibility to provide the medical community with assistance in the evaluation and management of patients with chronic pain. Unfortunately our resources are limited. Because we do not have an unlimited amount of time, or available appointments, we are required to closely monitor and manage their use. The following rules exist to maximize their use:  Patient's responsibilities: 1. Punctuality:  At what time should I arrive? You should be physically present in our office 30 minutes before your scheduled appointment. Your scheduled appointment is with your assigned healthcare provider. However, it takes 5-10 minutes to be "checked-in", and another 15 minutes for the nurses to do the admission. If you arrive to our office at the time you were given for your appointment, you will end up being at least 20-25 minutes late to your appointment with the provider. 2. Tardiness:  What happens if I arrive only a few minutes after my scheduled appointment time? You will need to reschedule your appointment. The cutoff is your appointment time. This is why it is so important that you arrive at least 30 minutes before that appointment. If you have an appointment scheduled for 10:00 AM and you arrive at 10:01, you will be required to reschedule your appointment.  3. Plan ahead:  Always assume that you will encounter traffic on your way in. Plan for it. If you are dependent on a driver, make sure they understand these rules and the need to arrive early. 4. Other appointments and responsibilities:  Avoid scheduling any other appointments before or after your pain clinic appointments.  5. Be prepared:  Write down everything that you need to discuss with your healthcare provider and give this information to the admitting nurse. Write down the medications that you will need  refilled. Bring your pills and bottles (even the empty ones), to all of your appointments, except for those where a procedure is scheduled. 6. No children or pets:  Find someone to take care of them. It is not appropriate to bring them in. 7. Scheduling changes:  We request "advanced notification" of any changes or cancellations. 8. Advanced notification:  Defined as a time period of more than 24 hours prior to the originally scheduled appointment. This allows for the appointment to be offered to other patients. 9. Rescheduling:  When a visit is rescheduled, it will require the cancellation of the original appointment. For this reason they both fall within the category of "Cancellations".  10. Cancellations:  They require advanced notification. Any cancellation less than 24 hours before the  appointment will be recorded as a "No Show". 11. No Show:  Defined as an unkept appointment where the patient failed to notify or declare to the practice their intention or inability to keep the appointment.  Corrective process for repeat offenders:  1. Tardiness: Three (3) episodes of rescheduling due to late arrivals will be recorded as one (1) "No Show". 2. Cancellation or reschedule: Three (3) cancellations or rescheduling will be recorded as one (1) "No Show". 3. "No Shows": Three (3) "No Shows" within a 12 month period will result in discharge from the practice. ____________________________________________________________________________________________   ______________________________________________________________________________________________  Specialty Pain Scale  Introduction:  There are significant differences in how pain is reported. The word pain usually refers to physical pain, but it is also a common synonym of suffering. The medical community uses a scale from 0 (zero) to 10 (ten) to report pain level. Zero (0) is described as "no pain",   while ten (10) is described as "the worse pain  you can imagine". The problem with this scale is that physical pain is reported along with suffering. Suffering refers to mental pain, or more often yet it refers to any unpleasant feeling, emotion or aversion associated with the perception of harm or threat of harm. It is the psychological component of pain.  Pain Specialists prefer to separate the two components. The pain scale used by this practice is the Verbal Numerical Rating Scale (VNRS-11). This scale is for the physical pain only. DO NOT INCLUDE how your pain psychologically affects you. This scale is for adults 21 years of age and older. It has 11 (eleven) levels. The 1st level is 0/10. This means: "right now, I have no pain". In the context of pain management, it also means: "right now, my physical pain is under control with the current therapy".  General Information:  The scale should reflect your current level of pain. Unless you are specifically asked for the level of your worst pain, or your average pain. If you are asked for one of these two, then it should be understood that it is over the past 24 hours.  Levels 1 (one) through 5 (five) are described below, and can be treated as an outpatient. Ambulatory pain management facilities such as ours are more than adequate to treat these levels. Levels 6 (six) through 10 (ten) are also described below, however, these must be treated as a hospitalized patient. While levels 6 (six) and 7 (seven) may be evaluated at an urgent care facility, levels 8 (eight) through 10 (ten) constitute medical emergencies and as such, they belong in a hospital's emergency department. When having these levels (as described below), do not come to our office. Our facility is not equipped to manage these levels. Go directly to an urgent care facility or an emergency department to be evaluated.  Definitions:  Activities of Daily Living (ADL): Activities of daily living (ADL or ADLs) is a term used in healthcare to refer to  people's daily self-care activities. Health professionals often use a person's ability or inability to perform ADLs as a measurement of their functional status, particularly in regard to people post injury, with disabilities and the elderly. There are two ADL levels: Basic and Instrumental. Basic Activities of Daily Living (BADL  or BADLs) consist of self-care tasks that include: Bathing and showering; personal hygiene and grooming (including brushing/combing/styling hair); dressing; Toilet hygiene (getting to the toilet, cleaning oneself, and getting back up); eating and self-feeding (not including cooking or chewing and swallowing); functional mobility, often referred to as "transferring", as measured by the ability to walk, get in and out of bed, and get into and out of a chair; the broader definition (moving from one place to another while performing activities) is useful for people with different physical abilities who are still able to get around independently. Basic ADLs include the things many people do when they get up in the morning and get ready to go out of the house: get out of bed, go to the toilet, bathe, dress, groom, and eat. On the average, loss of function typically follows a particular order. Hygiene is the first to go, followed by loss of toilet use and locomotion. The last to go is the ability to eat. When there is only one remaining area in which the person is independent, there is a 62.9% chance that it is eating and only a 3.5% chance that it is hygiene. Instrumental Activities   of Daily Living (IADL or IADLs) are not necessary for fundamental functioning, but they let an individual live independently in a community. IADL consist of tasks that include: cleaning and maintaining the house; home establishment and maintenance; care of others (including selecting and supervising caregivers); care of pets; child rearing; managing money; managing financials (investments, etc.); meal preparation  and cleanup; shopping for groceries and necessities; moving within the community; safety procedures and emergency responses; health management and maintenance (taking prescribed medications); and using the telephone or other form of communication.  Instructions:  Most patients tend to report their pain as a combination of two factors, their physical pain and their psychosocial pain. This last one is also known as "suffering" and it is reflection of how physical pain affects you socially and psychologically. From now on, report them separately.  From this point on, when asked to report your pain level, report only your physical pain. Use the following table for reference.  Pain Clinic Pain Levels (0-5/10)  Pain Level Score  Description  No Pain 0   Mild pain 1 Nagging, annoying, but does not interfere with basic activities of daily living (ADL). Patients are able to eat, bathe, get dressed, toileting (being able to get on and off the toilet and perform personal hygiene functions), transfer (move in and out of bed or a chair without assistance), and maintain continence (able to control bladder and bowel functions). Blood pressure and heart rate are unaffected. A normal heart rate for a healthy adult ranges from 60 to 100 bpm (beats per minute).   Mild to moderate pain 2 Noticeable and distracting. Impossible to hide from other people. More frequent flare-ups. Still possible to adapt and function close to normal. It can be very annoying and may have occasional stronger flare-ups. With discipline, patients may get used to it and adapt.   Moderate pain 3 Interferes significantly with activities of daily living (ADL). It becomes difficult to feed, bathe, get dressed, get on and off the toilet or to perform personal hygiene functions. Difficult to get in and out of bed or a chair without assistance. Very distracting. With effort, it can be ignored when deeply involved in activities.   Moderately severe pain  4 Impossible to ignore for more than a few minutes. With effort, patients may still be able to manage work or participate in some social activities. Very difficult to concentrate. Signs of autonomic nervous system discharge are evident: dilated pupils (mydriasis); mild sweating (diaphoresis); sleep interference. Heart rate becomes elevated (>115 bpm). Diastolic blood pressure (lower number) rises above 100 mmHg. Patients find relief in laying down and not moving.   Severe pain 5 Intense and extremely unpleasant. Associated with frowning face and frequent crying. Pain overwhelms the senses.  Ability to do any activity or maintain social relationships becomes significantly limited. Conversation becomes difficult. Pacing back and forth is common, as getting into a comfortable position is nearly impossible. Pain wakes you up from deep sleep. Physical signs will be obvious: pupillary dilation; increased sweating; goosebumps; brisk reflexes; cold, clammy hands and feet; nausea, vomiting or dry heaves; loss of appetite; significant sleep disturbance with inability to fall asleep or to remain asleep. When persistent, significant weight loss is observed due to the complete loss of appetite and sleep deprivation.  Blood pressure and heart rate becomes significantly elevated. Caution: If elevated blood pressure triggers a pounding headache associated with blurred vision, then the patient should immediately seek attention at an urgent or emergency care unit, as   these may be signs of an impending stroke.    Emergency Department Pain Levels (6-10/10)  Emergency Room Pain 6 Severely limiting. Requires emergency care and should not be seen or managed at an outpatient pain management facility. Communication becomes difficult and requires great effort. Assistance to reach the emergency department may be required. Facial flushing and profuse sweating along with potentially dangerous increases in heart rate and blood pressure  will be evident.   Distressing pain 7 Self-care is very difficult. Assistance is required to transport, or use restroom. Assistance to reach the emergency department will be required. Tasks requiring coordination, such as bathing and getting dressed become very difficult.   Disabling pain 8 Self-care is no longer possible. At this level, pain is disabling. The individual is unable to do even the most "basic" activities such as walking, eating, bathing, dressing, transferring to a bed, or toileting. Fine motor skills are lost. It is difficult to think clearly.   Incapacitating pain 9 Pain becomes incapacitating. Thought processing is no longer possible. Difficult to remember your own name. Control of movement and coordination are lost.   The worst pain imaginable 10 At this level, most patients pass out from pain. When this level is reached, collapse of the autonomic nervous system occurs, leading to a sudden drop in blood pressure and heart rate. This in turn results in a temporary and dramatic drop in blood flow to the brain, leading to a loss of consciousness. Fainting is one of the body's self defense mechanisms. Passing out puts the brain in a calmed state and causes it to shut down for a while, in order to begin the healing process.    Summary: 1. Refer to this scale when providing [place] with your pain level. 2. Be accurate and careful when reporting your pain level. This will help with your care. 3. Over-reporting your pain level will lead to loss of credibility. 4. Even a level of 1/10 means that there is pain and will be treated at our facility. 5. High, inaccurate reporting will be documented as "Symptom Exaggeration", leading to loss of credibility and suspicions of possible secondary gains such as obtaining more narcotics, or wanting to appear disabled, for fraudulent reasons. 6. Only pain levels of 5 or below will be seen at our facility. 7. Pain levels of 6 and above will be sent to the  Emergency Department and the appointment cancelled. ______________________________________________________________________________________________   BMI Assessment: Estimated body mass index is 32.61 kg/m as calculated from the following:   Height as of this encounter: 5\' 4"  (1.626 m).   Weight as of this encounter: 190 lb (86.2 kg).  BMI interpretation table: BMI level Category Range association with higher incidence of chronic pain  <18 kg/m2 Underweight   18.5-24.9 kg/m2 Ideal body weight   25-29.9 kg/m2 Overweight Increased incidence by 20%  30-34.9 kg/m2 Obese (Class I) Increased incidence by 68%  35-39.9 kg/m2 Severe obesity (Class II) Increased incidence by 136%  >40 kg/m2 Extreme obesity (Class III) Increased incidence by 254%   BMI Readings from Last 4 Encounters:  08/16/18 32.61 kg/m  08/06/18 33.99 kg/m  06/18/18 32.86 kg/m  06/15/18 34.62 kg/m   Wt Readings from Last 4 Encounters:  08/16/18 190 lb (86.2 kg)  08/06/18 193 lb 6.4 oz (87.7 kg)  06/18/18 187 lb (84.8 kg)  06/15/18 197 lb (89.4 kg)

## 2018-08-20 LAB — COMP. METABOLIC PANEL (12)
AST: 18 IU/L (ref 0–40)
Albumin/Globulin Ratio: 1.8 (ref 1.2–2.2)
Albumin: 4.4 g/dL (ref 3.5–5.5)
Alkaline Phosphatase: 64 IU/L (ref 39–117)
BUN/Creatinine Ratio: 18 (ref 9–23)
BUN: 17 mg/dL (ref 6–24)
Bilirubin Total: 0.8 mg/dL (ref 0.0–1.2)
Calcium: 9.8 mg/dL (ref 8.7–10.2)
Chloride: 106 mmol/L (ref 96–106)
Creatinine, Ser: 0.92 mg/dL (ref 0.57–1.00)
GFR calc Af Amer: 86 mL/min/1.73
GFR calc non Af Amer: 74 mL/min/1.73
Globulin, Total: 2.4 g/dL (ref 1.5–4.5)
Glucose: 97 mg/dL (ref 65–99)
Potassium: 4.1 mmol/L (ref 3.5–5.2)
Sodium: 144 mmol/L (ref 134–144)
Total Protein: 6.8 g/dL (ref 6.0–8.5)

## 2018-08-20 LAB — 25-HYDROXY VITAMIN D LCMS D2+D3
25-Hydroxy, Vitamin D-2: <1 ng/mL
25-Hydroxy, Vitamin D-3: 67 ng/mL
25-Hydroxy, Vitamin D: 67 ng/mL

## 2018-08-20 LAB — SEDIMENTATION RATE: Sed Rate: 37 mm/hr — ABNORMAL HIGH (ref 0–32)

## 2018-08-20 LAB — MAGNESIUM: Magnesium: 2.1 mg/dL (ref 1.6–2.3)

## 2018-08-20 LAB — VITAMIN B12: Vitamin B-12: 932 pg/mL (ref 232–1245)

## 2018-08-20 LAB — C-REACTIVE PROTEIN: CRP: 2 mg/L (ref 0–10)

## 2018-08-21 ENCOUNTER — Encounter: Payer: Self-pay | Admitting: Nurse Practitioner

## 2018-08-21 DIAGNOSIS — R700 Elevated erythrocyte sedimentation rate: Secondary | ICD-10-CM | POA: Insufficient documentation

## 2018-08-22 LAB — COMPLIANCE DRUG ANALYSIS, UR

## 2018-08-30 NOTE — Progress Notes (Signed)
Patient's Name: Denise Burns  MRN: [id number]  Referring Provider: Elby Beck, FNP  DOB: 18-Jun-1971  PCP: Elby Beck, FNP  DOS: 09/03/2018  Note by: [name], MD  Service setting: Ambulatory outpatient  Specialty: Interventional Pain Management  Location: [facility] (AMB) Pain Management Facility    Patient type: Established   Primary Reason(s) for Visit: Encounter for evaluation before starting new chronic pain management plan of care (Level of risk: moderate) CC: Back Pain (low) and Hip Pain (bilateral)  HPI  Denise Burns is a 48 y.o. year old, female patient, who comes today for a follow-up evaluation to review the test results and decide on a treatment plan. She has Addison's disease ([place]); Class 1 obesity due to excess calories without serious comorbidity with body mass index (BMI) of 31.0 to 31.9 in adult; Chronic midline low back pain with bilateral sciatica; Premature ovarian failure; Chronic low back pain (Primary Area of Pain) (Bilateral) (R>L) w/o sciatica; Chronic sacroiliac joint pain (Secondary Area of Pain) (Right); Chronic pain syndrome; Long term current use of opiate analgesic; Pharmacologic therapy; Disorder of skeletal system; Problems influencing health status; Elevated sed rate; Abnormal MRI, lumbar spine (03/22/2018); Lumbar facet hypertrophy (Multilevel) (Bilateral); Lumbar facet syndrome (Bilateral); DDD (degenerative disc disease), lumbosacral; Spondylolisthesis (8 mm) at L5-S1 level; Lumbar Grade 1 Retrolisthesis (2 mm) of L2/L3; L5-S1 pars defect w/ spondylolisthesis (Bilateral); Lumbar pars defect (L5-S1) (Bilateral); Lumbar foraminal stenosis (Left: L2-3) (Bilateral: L5-S1); Osteoarthritis of sacroiliac joint (Bilateral); Chronic musculoskeletal pain; Neurogenic pain; Osteoarthritis of facet joint of lumbar spine; and Greater trochanteric bursitis (Bilateral) on their problem list. Her primarily concern today is the Back Pain (low) and Hip Pain  (bilateral)  Pain Assessment: Location: Lower Back Radiating: bilateral hips Onset: More than a month ago Duration: Chronic pain Quality: Aching, Constant Severity: 4 /10 (subjective, self-reported pain score)  Note: Reported level is inconsistent with clinical observations. Clinically the patient looks like a 2/10 A 2/10 is viewed as "Mild to Moderate" and described as noticeable and distracting. Impossible to hide from other people. More frequent flare-ups. Still possible to adapt and function close to normal. It can be very annoying and may have occasional stronger flare-ups. With discipline, patients may get used to it and adapt. Information on the proper use of the pain scale provided to the patient today. When using our objective Pain Scale, levels between 6 and 10/10 are said to belong in an emergency room, as it progressively worsens from a 6/10, described as severely limiting, requiring emergency care not usually available at an outpatient pain management facility. At a 6/10 level, communication becomes difficult and requires great effort. Assistance to reach the emergency department may be required. Facial flushing and profuse sweating along with potentially dangerous increases in heart rate and blood pressure will be evident. Timing: Constant Modifying factors: medications, heat, warm baths BP: 131/85  HR: (!) 112  Denise Burns comes in today for a follow-up visit after her initial evaluation on 08/16/2018. Today we went over the results of her tests. These were explained in "Layman's terms". During today's appointment we went over my diagnostic impression, as well as the proposed treatment plan.  According to the patient her primary area of pain is in her lower back (B) (R>L).  She denies any precipitating factors.  She admits the right side is greater than the left.  She denies any previous surgery.  She has had epidural steroid injections and sacroiliac joint injections last being in  September 2019.  She admits that they were effective.  She has seen sports medicine November 2019 and continues to do stretching exercises.  She had an MRI August 2019.  The patient denies any lower extremity pain, numbness, or weakness.  In considering the treatment plan options, Ms. Bosler was reminded that I no longer take patients for medication management only. I asked her to let me know if she had no intention of taking advantage of the interventional therapies, so that we could make arrangements to provide this space to someone interested. I also made it clear that undergoing interventional therapies for the purpose of getting pain medications is very inappropriate on the part of a patient, and it will not be tolerated in this practice. This type of behavior would suggest true addiction and therefore it requires referral to an addiction specialist.   Further details on both, my assessment(s), as well as the proposed treatment plan, please see below.  Controlled Substance Pharmacotherapy Assessment REMS (Risk Evaluation and Mitigation Strategy)  Analgesic: Hydrocodone/APAP 7.5/325 1 tablet p.o. 3 times daily (22.5 mg/day of hydrocodone) Highest recorded MME/day: 90 mg/day MME/day: 22.5 mg/day  Pill Count: None expected due to no prior prescriptions written by our practice. Hart Rochester, RN  09/03/2018  8:32 AM  Sign when Signing Visit Safety precautions to be maintained throughout the outpatient stay will include: orient to surroundings, keep bed in low position, maintain call bell within reach at all times, provide assistance with transfer out of bed and ambulation.    Pharmacokinetics: Liberation and absorption (onset of action): WNL Distribution (time to peak effect): WNL Metabolism and excretion (duration of action): WNL         Pharmacodynamics: Desired effects: Analgesia: Ms. Lupe reports >50% benefit. Functional ability: Patient reports that medication allows her to  accomplish basic ADLs Clinically meaningful improvement in function (CMIF): Sustained CMIF goals met Perceived effectiveness: Described as relatively effective, allowing for increase in activities of daily living (ADL) Undesirable effects: Side-effects or Adverse reactions: None reported Monitoring: [place] PMP: Online review of the past 64-monthperiod previously conducted. Not applicable at this point since we have not taken over the patient's medication management yet. List of other Serum/Urine Drug Screening Test(s):  No results found. List of all UDS test(s) done:  Lab Results  Component Value Date   SUMMARY FINAL 08/16/2018   Last UDS on record: Summary  Date Value Ref Range Status  08/16/2018 FINAL  Final    Comment:    ==================================================================== TOXASSURE COMP DRUG ANALYSIS,UR ==================================================================== Test                             Result       Flag       Units Drug Present and Declared for Prescription Verification   Hydrocodone                    205          EXPECTED   ng/mg creat   Hydromorphone                  671          EXPECTED   ng/mg creat   Dihydrocodeine                 153          EXPECTED   ng/mg creat   Norhydrocodone  642          EXPECTED   ng/mg creat    Sources of hydrocodone include scheduled prescription    medications. Hydromorphone, dihydrocodeine and norhydrocodone are    expected metabolites of hydrocodone. Hydromorphone and    dihydrocodeine are also available as scheduled prescription    medications.   Ephedrine/Pseudoephedrine      PRESENT      EXPECTED   Phenylpropanolamine            PRESENT      EXPECTED    Source of ephedrine/pseudoephedrine is most commonly    pseudoephedrine in over-the-counter or prescription cold and    allergy medications. Phenylpropanolamine is an expected    metabolite of ephedrine/pseudoephedrine.   Baclofen                        PRESENT      EXPECTED   Acetaminophen                  PRESENT      EXPECTED Drug Present not Declared for Prescription Verification   Ibuprofen                      PRESENT      UNEXPECTED   Doxylamine                     PRESENT      UNEXPECTED   Dextrorphan/Levorphanol        PRESENT      UNEXPECTED    Dextrorphan is an expected metabolite of dextromethorphan, an    over-the-counter or prescription cough suppressant. Levorphanol    is a scheduled prescription medication. Dextrorphan cannot be    distinguished from levorphanol by the method used for analysis. ==================================================================== Test                      Result    Flag   Units      Ref Range   Creatinine              234              mg/dL      >=20 ==================================================================== Declared Medications:  The flagging and interpretation on this report are based on the  following declared medications.  Unexpected results may arise from  inaccuracies in the declared medications.  **Note: The testing scope of this panel includes these medications:  Baclofen  Hydrocodone (Hydrocodone-Acetaminophen)  Pseudoephedrine (Sudafed)  **Note: The testing scope of this panel does not include small to  moderate amounts of these reported medications:  Acetaminophen (Hydrocodone-Acetaminophen)  **Note: The testing scope of this panel does not include following  reported medications:  Cholecalciferol  Fexofenadine  Fludrocortisone (Florinef)  Hydrocortisone (Cortef) ==================================================================== For clinical consultation, please call [phone]. ====================================================================    UDS interpretation: No unexpected findings.          Medication Assessment Form: Patient introduced to form today Treatment compliance: Treatment may start today if patient agrees with  proposed plan. Evaluation of compliance is not applicable at this point Risk Assessment Profile: Aberrant behavior: See initial evaluations. None observed or detected today Comorbid factors increasing risk of overdose: See initial evaluation. No additional risks detected today Opioid risk tool (ORT) (Total Score): 1 Personal History of Substance Abuse (SUD-Substance use disorder):  Alcohol: Negative  Illegal Drugs: Negative  Rx Drugs: Negative  ORT Risk Level  calculation: Low Risk Risk of substance use disorder (SUD): Low Opioid Risk Tool - 09/03/18 0832      Family History of Substance Abuse   Alcohol  Positive Female    Illegal Drugs  Negative    Rx Drugs  Negative      Personal History of Substance Abuse   Alcohol  Negative    Illegal Drugs  Negative    Rx Drugs  Negative      Age   Age between 65-45 years   No      History of Preadolescent Sexual Abuse   History of Preadolescent Sexual Abuse  Negative or Female      Psychological Disease   Psychological Disease  Negative    Depression  Negative      Total Score   Opioid Risk Tool Scoring  1    Opioid Risk Interpretation  Low Risk      ORT Scoring interpretation table:  Score <3 = Low Risk for SUD  Score between 4-7 = Moderate Risk for SUD  Score >8 = High Risk for Opioid Abuse   Risk Mitigation Strategies:  Patient opioid safety counseling: Completed today. Counseling provided to patient as per "Patient Counseling Document". Document signed by patient, attesting to counseling and understanding Patient-Prescriber Agreement (PPA): Obtained today.  Controlled substance notification to other providers: Written and sent today.  Pharmacologic Plan: Today we may be taking over the patient's pharmacological regimen. See below.             Laboratory Chemistry  Inflammation Markers (CRP: Acute Phase) (ESR: Chronic Phase) Lab Results  Component Value Date   CRP 2 08/16/2018   ESRSEDRATE 37 (H) 08/16/2018  NOTE:  Elevated set rate is usually seen with chronic phase inflammatory disease.  Rheumatology Markers No results found.  Renal Function Markers Lab Results  Component Value Date   BUN 17 08/16/2018   CREATININE 0.92 08/16/2018   BCR 18 08/16/2018   GFRAA 86 08/16/2018   GFRNONAA 74 08/16/2018                             Hepatic Function Markers Lab Results  Component Value Date   AST 18 08/16/2018   ALBUMIN 4.4 08/16/2018   ALKPHOS 64 08/16/2018                        Electrolytes Lab Results  Component Value Date   NA 144 08/16/2018   K 4.1 08/16/2018   CL 106 08/16/2018   CALCIUM 9.8 08/16/2018   MG 2.1 08/16/2018                        Neuropathy Markers Lab Results  Component Value Date   OITGPQDI26 415 08/16/2018   HGBA1C 6.1 04/30/2018                        CNS Tests No results found.  Bone Pathology Markers Lab Results  Component Value Date   VD25OH 13.55 (L) 04/30/2018   25OHVITD1 67 08/16/2018   25OHVITD2 <1.0 08/16/2018   25OHVITD3 67 08/16/2018                         Coagulation Parameters No results found.  Cardiovascular Markers No results found.  CA Markers No results found.  Note: Lab results reviewed.  Recent  Diagnostic Imaging Review  Lumbosacral Imaging: Lumbar MR wo contrast:  Results for orders placed during the hospital encounter of 03/22/18  MR LUMBAR SPINE WO CONTRAST   Narrative CLINICAL DATA:  Initial evaluation for chronic low back pain radiating into the left lower extremity.  EXAM: MRI LUMBAR SPINE WITHOUT CONTRAST  TECHNIQUE: Multiplanar, multisequence MR imaging of the lumbar spine was performed. No intravenous contrast was administered.  COMPARISON:  None available.  FINDINGS: Segmentation: Normal segmentation. Lowest well-formed disc labeled the L5-S1 level.  Alignment: Chronic bilateral pars defects at L5 with associated 8 mm spondylolisthesis of L5 on S1. Trace 2 mm retrolisthesis of L2 on L3.  Alignment otherwise normal.  Vertebrae: Vertebral body heights well maintained without evidence for acute or chronic fracture. Bone marrow signal intensity within normal limits. Well-circumscribed 2.3 cm T2 hyperintense lesion within the L3 vertebral body demonstrates internal trabeculation, felt to be most consistent with an atypical hemangioma. No other discrete or worrisome osseous lesions. Mild reactive endplate changes noted about the T11-12 interspace anteriorly.  Conus medullaris and cauda equina: Conus extends to the L1 level. Conus and cauda equina appear normal.  Paraspinal and other soft tissues: Paraspinous soft tissues within normal limits. Visualized visceral structures unremarkable.  Disc levels:  T11-12: Seen only on sagittal projection. Diffuse disc bulge with disc desiccation and intervertebral disc space narrowing. Mild reactive endplate changes. No stenosis.  T12-L1: Unremarkable.  L1-2: Tiny central disc protrusion indents the ventral thecal sac. Mild facet hypertrophy. No stenosis.  L2-3: Trace retrolisthesis. Minimal annular disc bulge with disc desiccation. Mild facet hypertrophy. No significant spinal stenosis. Mild left L2 foraminal narrowing.  L3-4: Disc desiccation with minimal bulge. Mild facet hypertrophy. No stenosis.  L4-5: Negative interspace. Moderate facet hypertrophy. No stenosis.  L5-S1: Chronic bilateral pars defects with 8 mm spondylolisthesis. Associated broad posterior pseudo disc bulge/uncovering. Central canal remains widely patent. Moderate to advanced bilateral L5 foraminal stenosis, potentially affecting either of the L5 nerve roots.  IMPRESSION: 1. Chronic bilateral pars defects at L5 with associated 8 mm spondylolisthesis and resultant moderate bilateral L5 foraminal stenosis. Either of the L5 nerve roots could be affected. 2. Minor disc bulge with facet hypertrophy at L2-3 with resultant mild left L2 foraminal  narrowing. 3. Additional minor degenerative changes elsewhere within the lumbar spine without significant stenosis or impingement.   Electronically Signed   By: Jeannine Boga M.D.   On: 03/22/2018 21:51    Sacroiliac Joint Imaging: Sacroiliac Joint DG:  Results for orders placed during the hospital encounter of 08/16/18  DG Si Joints   Narrative CLINICAL DATA:  Chronic right sacroiliac joint pain.  EXAM: BILATERAL SACROILIAC JOINTS - 3+ VIEW  COMPARISON:  MRI lumbar spine 03/22/2018  FINDINGS: Sacroiliac joint spaces are maintained bilaterally without osteolysis or ankylosis. Mild degenerative changes along the caudal aspect of the SI joints. Probable facet arthropathy near the lumbosacral junction but incompletely characterized on this examination.  IMPRESSION: Symmetric appearance of the SI joints without significant arthropathy.   Electronically Signed   By: Markus Daft M.D.   On: 08/16/2018 16:22    Complexity Note: Imaging results reviewed. Results shared with [name], using Layman's terms.                         Meds   Current Outpatient Medications:  .  baclofen (LIORESAL) 10 MG tablet, Take 1 tablet (10 mg total) by mouth daily for 30 days., Disp: 30 tablet, Rfl: 0 .  Cholecalciferol (VITAMIN D3) 50000 units TABS, Take 1 tablet by mouth every 7 (seven) days., Disp: 12 tablet, Rfl: 3 .  fexofenadine (ALLEGRA) 180 MG tablet, Take 180 mg by mouth daily., Disp: , Rfl:  .  fludrocortisone (FLORINEF) 0.1 MG tablet, Take 0.05 mg by mouth daily., Disp: , Rfl: 3 .  [START ON 09/19/2018] HYDROcodone-acetaminophen (NORCO) 7.5-325 MG tablet, Take 1 tablet by mouth every 8 (eight) hours as needed for up to 30 days for moderate pain. Must last 30 days., Disp: 90 tablet, Rfl: 0 .  hydrocortisone (CORTEF) 10 MG tablet, Take 1.5 tablets (15 mg total) by mouth daily with breakfast AND 0.5 tablets (5 mg total) every evening., Disp: 180 tablet, Rfl: 3 .   pseudoephedrine (SUDAFED) 30 MG tablet, Take 30 mg by mouth every 4 (four) hours as needed for congestion., Disp: , Rfl:  .  meloxicam (MOBIC) 15 MG tablet, Take 1 tablet (15 mg total) by mouth daily for 30 days., Disp: 30 tablet, Rfl: 0  ROS  Constitutional: Denies any fever or chills Gastrointestinal: No reported hemesis, hematochezia, vomiting, or acute GI distress Musculoskeletal: Denies any acute onset joint swelling, redness, loss of ROM, or weakness Neurological: No reported episodes of acute onset apraxia, aphasia, dysarthria, agnosia, amnesia, paralysis, loss of coordination, or loss of consciousness  Allergies  Ms. Palmisano is allergic to lisinopril; ultram [tramadol]; and sulfa antibiotics.  [place]  Drug: Ms. Outland  reports no history of drug use. Alcohol:  reports current alcohol use. Tobacco:  reports that she has quit smoking. She has never used smokeless tobacco. Medical:  has a past medical history of Addison disease ([place]), Arthritis, and GERD (gastroesophageal reflux disease). Surgical: Ms. Galla  has a past surgical history that includes Cholecystectomy and polyectomy  (2011). Family: family history includes Alcohol abuse in her father; Arthritis in her mother; Hearing loss in her father; Hyperlipidemia in her father and mother; Hypertension in her mother.  Constitutional Exam  General appearance: Well nourished, well developed, and well hydrated. In no apparent acute distress Vitals:   09/03/18 0828  BP: 131/85  Pulse: (!) 112  Resp: 18  Temp: 98.2 F (36.8 C)  TempSrc: Oral  SpO2: 100%  Weight: 190 lb (86.2 kg)  Height: 5' 3"  (1.6 m)   BMI Assessment: Estimated body mass index is 33.66 kg/m as calculated from the following:   Height as of this encounter: 5' 3"  (1.6 m).   Weight as of this encounter: 190 lb (86.2 kg).  BMI interpretation table: BMI level Category Range association with higher incidence of chronic pain  <18 kg/m2 Underweight   18.5-24.9  kg/m2 Ideal body weight   25-29.9 kg/m2 Overweight Increased incidence by 20%  30-34.9 kg/m2 Obese (Class I) Increased incidence by 68%  35-39.9 kg/m2 Severe obesity (Class II) Increased incidence by 136%  >40 kg/m2 Extreme obesity (Class III) Increased incidence by 254%   Patient's current BMI Ideal Body weight  Body mass index is 33.66 kg/m. Ideal body weight: 52.4 kg (115 lb 8.3 oz) Adjusted ideal body weight: 65.9 kg (145 lb 5 oz)   BMI Readings from Last 4 Encounters:  09/03/18 33.66 kg/m  08/16/18 32.61 kg/m  08/06/18 33.99 kg/m  06/18/18 32.86 kg/m   Wt Readings from Last 4 Encounters:  09/03/18 190 lb (86.2 kg)  08/16/18 190 lb (86.2 kg)  08/06/18 193 lb 6.4 oz (87.7 kg)  06/18/18 187 lb (84.8 kg)  Psych/Mental status: Alert, oriented x 3 (person, place, & time)  Eyes: PERLA Respiratory: No evidence of acute respiratory distress  Cervical Spine Area Exam  Skin & Axial Inspection: No masses, redness, edema, swelling, or associated skin lesions Alignment: Symmetrical Functional ROM: Unrestricted ROM      Stability: No instability detected Muscle Tone/Strength: Functionally intact. No obvious neuro-muscular anomalies detected. Sensory (Neurological): Unimpaired Palpation: No palpable anomalies              Upper Extremity (UE) Exam    Side: Right upper extremity  Side: Left upper extremity  Skin & Extremity Inspection: Skin color, temperature, and hair growth are WNL. No peripheral edema or cyanosis. No masses, redness, swelling, asymmetry, or associated skin lesions. No contractures.  Skin & Extremity Inspection: Skin color, temperature, and hair growth are WNL. No peripheral edema or cyanosis. No masses, redness, swelling, asymmetry, or associated skin lesions. No contractures.  Functional ROM: Unrestricted ROM          Functional ROM: Unrestricted ROM          Muscle Tone/Strength: Functionally intact. No obvious neuro-muscular anomalies detected.  Muscle  Tone/Strength: Functionally intact. No obvious neuro-muscular anomalies detected.  Sensory (Neurological): Unimpaired          Sensory (Neurological): Unimpaired          Palpation: No palpable anomalies              Palpation: No palpable anomalies              Provocative Test(s):  Phalen's test: deferred Tinel's test: deferred Apley's scratch test (touch opposite shoulder):  Action 1 (Across chest): deferred Action 2 (Overhead): deferred Action 3 (LB reach): deferred   Provocative Test(s):  Phalen's test: deferred Tinel's test: deferred Apley's scratch test (touch opposite shoulder):  Action 1 (Across chest): deferred Action 2 (Overhead): deferred Action 3 (LB reach): deferred    Thoracic Spine Area Exam  Skin & Axial Inspection: No masses, redness, or swelling Alignment: Symmetrical Functional ROM: Unrestricted ROM Stability: No instability detected Muscle Tone/Strength: Functionally intact. No obvious neuro-muscular anomalies detected. Sensory (Neurological): Unimpaired Muscle strength & Tone: No palpable anomalies  Lumbar Spine Area Exam  Skin & Axial Inspection: No masses, redness, or swelling Alignment: Symmetrical Functional ROM: Decreased ROM affecting both sides Stability: No instability detected Muscle Tone/Strength: Increased muscle tone over affected area Sensory (Neurological): Movement-associated pain Palpation: Complains of area being tender to palpation       Provocative Tests: Hyperextension/rotation test: (+) bilaterally for facet joint pain. Lumbar quadrant test (Kemp's test): (+) bilaterally for facet joint pain. Lateral bending test: (+) Low back pain. No LE Pain. Patrick's Maneuver: (+) for bilateral S-I arthralgia             FABER* test: (+) for bilateral S-I arthralgia             S-I anterior distraction/compression test: deferred today         S-I lateral compression test: deferred today         S-I Thigh-thrust test: deferred today          S-I Gaenslen's test: deferred today         *(Flexion, ABduction and External Rotation)  Gait & Posture Assessment  Ambulation: Unassisted Gait: Relatively normal for age and body habitus Posture: WNL   Lower Extremity Exam    Side: Right lower extremity  Side: Left lower extremity  Stability: No instability observed          Stability: No instability observed  Skin & Extremity Inspection: Skin color, temperature, and hair growth are WNL. No peripheral edema or cyanosis. No masses, redness, swelling, asymmetry, or associated skin lesions. No contractures.  Skin & Extremity Inspection: Skin color, temperature, and hair growth are WNL. No peripheral edema or cyanosis. No masses, redness, swelling, asymmetry, or associated skin lesions. No contractures.  Functional ROM: Unrestricted ROM                  Functional ROM: Unrestricted ROM                  Muscle Tone/Strength: Able to Toe-walk & Heel-walk without problems  Muscle Tone/Strength: Able to Toe-walk & Heel-walk without problems  Sensory (Neurological): Unimpaired        Sensory (Neurological): Unimpaired        DTR: Patellar: deferred today Achilles: deferred today Plantar: deferred today  DTR: Patellar: deferred today Achilles: deferred today Plantar: deferred today  Palpation: No palpable anomalies  Palpation: No palpable anomalies   Assessment & Plan  Primary Diagnosis & Pertinent Problem List: The primary encounter diagnosis was Chronic pain syndrome. Diagnoses of Chronic low back pain (Primary Area of Pain) (Bilateral) (R>L) w/o sciatica, Chronic sacroiliac joint pain (Secondary Area of Pain) (Right), Lumbar facet syndrome (Bilateral), Lumbar facet hypertrophy (Multilevel) (Bilateral), Osteoarthritis of facet joint of lumbar spine, DDD (degenerative disc disease), lumbosacral, Spondylolisthesis at L5-S1 level, Lumbar Grade 1 Retrolisthesis (2 mm) of L2/L3, L5-S1 pars defect w/ spondylolisthesis (Bilateral), Lumbar  pars defect (L5-S1) (Bilateral), Lumbar foraminal stenosis (Left: L2-3) (Bilateral: L5-S1), Osteoarthritis of sacroiliac joint (Bilateral), Greater trochanteric bursitis (Bilateral), Chronic musculoskeletal pain, Neurogenic pain, and Abnormal MRI, lumbar spine (03/22/2018) were also pertinent to this visit.  Visit Diagnosis: 1. Chronic pain syndrome   2. Chronic low back pain (Primary Area of Pain) (Bilateral) (R>L) w/o sciatica   3. Chronic sacroiliac joint pain (Secondary Area of Pain) (Right)   4. Lumbar facet syndrome (Bilateral)   5. Lumbar facet hypertrophy (Multilevel) (Bilateral)   6. Osteoarthritis of facet joint of lumbar spine   7. DDD (degenerative disc disease), lumbosacral   8. Spondylolisthesis at L5-S1 level   9. Lumbar Grade 1 Retrolisthesis (2 mm) of L2/L3   10. L5-S1 pars defect w/ spondylolisthesis (Bilateral)   11. Lumbar pars defect (L5-S1) (Bilateral)   12. Lumbar foraminal stenosis (Left: L2-3) (Bilateral: L5-S1)   13. Osteoarthritis of sacroiliac joint (Bilateral)   14. Greater trochanteric bursitis (Bilateral)   15. Chronic musculoskeletal pain   16. Neurogenic pain   17. Abnormal MRI, lumbar spine (03/22/2018)    Problems updated and reviewed during this visit: Problem  Abnormal MRI, lumbar spine (03/22/2018)   FINDINGS: Alignment: Chronic bilateral pars defects at L5 with associated 8 mm spondylolisthesis of L5 on S1. Trace 2 mm retrolisthesis of L2 on L3. Alignment otherwise normal. Vertebrae: Vertebral body heights well maintained without evidence for acute or chronic fracture. Bone marrow signal intensity within normal limits. Well-circumscribed 2.3 cm T2 hyperintense lesion within the L3 vertebral body demonstrates internal trabeculation, felt to be most consistent with an atypical hemangioma. No other discrete or worrisome osseous lesions. Mild reactive endplate changes noted about the T11-12 interspace anteriorly.  Disc levels: T11-12: Seen only on sagittal  projection. Diffuse disc bulge with disc desiccation and intervertebral disc space narrowing. Mild reactive endplate changes. No stenosis. T12-L1: Unremarkable. L1-2: Tiny central disc protrusion indents the ventral thecal sac. Mild facet hypertrophy. No stenosis. L2-3: Trace retrolisthesis. Minimal annular disc bulge with disc desiccation. Mild  facet hypertrophy. No significant spinal stenosis. Mild left L2 foraminal narrowing. L3-4: Disc desiccation with minimal bulge. Mild facet hypertrophy. No stenosis. L4-5: Negative interspace. Moderate facet hypertrophy. No stenosis. L5-S1: Chronic bilateral pars defects with 8 mm spondylolisthesis. Associated broad posterior pseudo disc bulge/uncovering. Central canal remains widely patent. Moderate to advanced bilateral L5 foraminal stenosis, potentially affecting either of the L5 nerve roots.  IMPRESSION: 1. Chronic bilateral pars defects at L5 with associated 8 mm spondylolisthesis and resultant moderate bilateral L5 foraminal stenosis. Either of the L5 nerve roots could be affected. 2. Minor disc bulge with facet hypertrophy at L2-3 with resultant mild left L2 foraminal narrowing. 3. Additional minor degenerative changes elsewhere within the lumbar spine without significant stenosis or impingement.  Electronically Signed   By: Jeannine Boga M.D.   On: 03/22/2018 21:51   Lumbar facet hypertrophy (Multilevel) (Bilateral)   Levels: L1-2: Mild facet hypertrophy L2-3: Mild facet hypertrophy L3-4: Mild facet hypertrophy L4-5: Moderate facet hypertrophy   Lumbar facet syndrome (Bilateral)  Ddd (Degenerative Disc Disease), Lumbosacral  Spondylolisthesis (8 mm) at L5-S1 level   IMPRESSION: Chronic bilateral pars defects at L5 with associated 8 mm spondylolisthesis and resultant moderate bilateral L5 foraminal stenosis. Either of the L5 nerve roots could be affected.   Lumbar Grade 1 Retrolisthesis (2 mm) of L2/L3   FINDINGS: Alignment: Trace  2 mm retrolisthesis of L2 on L3.   L5-S1 pars defect w/ spondylolisthesis (Bilateral)  Lumbar pars defect (L5-S1) (Bilateral)  Lumbar foraminal stenosis (Left: L2-3) (Bilateral: L5-S1)   Levels: L2-3: Mild left L2 foraminal narrowing. L5-S1: Moderate to advanced bilateral L5 foraminal stenosis, potentially affecting either of the L5 nerve roots.   Osteoarthritis of sacroiliac joint (Bilateral)  Chronic Musculoskeletal Pain  Neurogenic Pain  Osteoarthritis of Facet Joint of Lumbar Spine  Greater trochanteric bursitis (Bilateral)  Chronic low back pain (Primary Area of Pain) (Bilateral) (R>L) w/o sciatica  Chronic sacroiliac joint pain (Secondary Area of Pain) (Right)   Plan of Care  Pharmacotherapy (Medications Ordered): Meds ordered this encounter  Medications  . baclofen (LIORESAL) 10 MG tablet    Sig: Take 1 tablet (10 mg total) by mouth daily for 30 days.    Dispense:  30 tablet    Refill:  0    Do not place medication on "Automatic Refill". Fill one day early if pharmacy is closed on scheduled refill date.  Marland Kitchen HYDROcodone-acetaminophen (NORCO) 7.5-325 MG tablet    Sig: Take 1 tablet by mouth every 8 (eight) hours as needed for up to 30 days for moderate pain. Must last 30 days.    Dispense:  90 tablet    Refill:  0    [employer] STOP ACT - Not applicable. Fill one day early if pharmacy is closed on scheduled refill date. Do not fill until: 09/19/18. Must last 30 days. To last until: 10/19/18.  Marland Kitchen meloxicam (MOBIC) 15 MG tablet    Sig: Take 1 tablet (15 mg total) by mouth daily for 30 days.    Dispense:  30 tablet    Refill:  0    Do not add this medication to the electronic "Automatic Refill" notification system. Patient may have prescription filled one day early if pharmacy is closed on scheduled refill date.    Procedure Orders     LUMBAR FACET(MEDIAL BRANCH NERVE BLOCK) [facility] Lab Orders  No laboratory test(s) ordered today    Imaging Orders     DG Lumbar Spine Complete  [facility] Referral Orders  No referral(s)  requested today   Pharmacological management options:  Opioid Analgesics: We'll take over management today. See above orders Membrane stabilizer: We have discussed the possibility of optimizing this mode of therapy, if tolerated Muscle relaxant: We have discussed the possibility of a trial NSAID: We have discussed the possibility of a trial Other analgesic(s): To be determined at a later time   Interventional management options: Planned, scheduled, and/or pending:    Today we will be keeping in a Mobic trial.  Our goal will be to move away from the opioid analgesics. I will put a PRN order for a diagnostic bilateral lumbar facet block #1 under fluoroscopic guidance and IV sedation.  The patient was instructed to call whenever she has a flareup of her low back pain so that we can go ahead and do this diagnostic injection.   Considering:   Diagnostic bilateral lumbar facet nerve block  Possible bilateral lumbar facet RFA  Diagnostic bilateral sacroiliac joint injection  Possible bilateral sacroiliac joint RFA  Diagnostic bilateral L5 transforaminal LESI  Diagnostic left-sided L2 transforaminal LESI    PRN Procedures:   Diagnostic bilateral lumbar facet nerve block #1 under fluoroscopic guidance and IV sedation   Provider-requested follow-up: Return in about 1 month (around 10/02/2018) for PRN Procedure (w/ sedation): (B) L-FCT BLK #1, Med-Mgmt, w/ Dr. Dossie Arbour.  Future Appointments  Date Time Provider [place]  02/05/2019  7:30 AM Shamleffer, Melanie Crazier, MD [id number] None    Primary Care Physician: Elby Beck, FNP Location: [facility] Note by: [name], MD Date: 09/03/2018; Time: 9:17 AM

## 2018-09-03 ENCOUNTER — Other Ambulatory Visit: Payer: Self-pay

## 2018-09-03 ENCOUNTER — Ambulatory Visit: Payer: No Typology Code available for payment source | Attending: Pain Medicine | Admitting: Pain Medicine

## 2018-09-03 ENCOUNTER — Encounter: Payer: Self-pay | Admitting: Pain Medicine

## 2018-09-03 VITALS — BP 131/85 | HR 112 | Temp 98.2°F | Resp 18 | Ht 63.0 in | Wt 190.0 lb

## 2018-09-03 DIAGNOSIS — M4317 Spondylolisthesis, lumbosacral region: Secondary | ICD-10-CM | POA: Diagnosis present

## 2018-09-03 DIAGNOSIS — G894 Chronic pain syndrome: Secondary | ICD-10-CM | POA: Diagnosis present

## 2018-09-03 DIAGNOSIS — M7062 Trochanteric bursitis, left hip: Secondary | ICD-10-CM | POA: Insufficient documentation

## 2018-09-03 DIAGNOSIS — G8929 Other chronic pain: Secondary | ICD-10-CM | POA: Diagnosis present

## 2018-09-03 DIAGNOSIS — M533 Sacrococcygeal disorders, not elsewhere classified: Secondary | ICD-10-CM | POA: Insufficient documentation

## 2018-09-03 DIAGNOSIS — M4300 Spondylolysis, site unspecified: Secondary | ICD-10-CM | POA: Insufficient documentation

## 2018-09-03 DIAGNOSIS — M47818 Spondylosis without myelopathy or radiculopathy, sacral and sacrococcygeal region: Secondary | ICD-10-CM | POA: Insufficient documentation

## 2018-09-03 DIAGNOSIS — M48061 Spinal stenosis, lumbar region without neurogenic claudication: Secondary | ICD-10-CM

## 2018-09-03 DIAGNOSIS — M47816 Spondylosis without myelopathy or radiculopathy, lumbar region: Secondary | ICD-10-CM

## 2018-09-03 DIAGNOSIS — M7061 Trochanteric bursitis, right hip: Secondary | ICD-10-CM

## 2018-09-03 DIAGNOSIS — M5137 Other intervertebral disc degeneration, lumbosacral region: Secondary | ICD-10-CM | POA: Diagnosis present

## 2018-09-03 DIAGNOSIS — M545 Low back pain: Secondary | ICD-10-CM | POA: Insufficient documentation

## 2018-09-03 DIAGNOSIS — M4310 Spondylolisthesis, site unspecified: Secondary | ICD-10-CM | POA: Diagnosis present

## 2018-09-03 DIAGNOSIS — M7918 Myalgia, other site: Secondary | ICD-10-CM | POA: Insufficient documentation

## 2018-09-03 DIAGNOSIS — R937 Abnormal findings on diagnostic imaging of other parts of musculoskeletal system: Secondary | ICD-10-CM | POA: Diagnosis present

## 2018-09-03 DIAGNOSIS — M792 Neuralgia and neuritis, unspecified: Secondary | ICD-10-CM

## 2018-09-03 DIAGNOSIS — M461 Sacroiliitis, not elsewhere classified: Secondary | ICD-10-CM | POA: Insufficient documentation

## 2018-09-03 DIAGNOSIS — M4306 Spondylolysis, lumbar region: Secondary | ICD-10-CM | POA: Diagnosis present

## 2018-09-03 DIAGNOSIS — M51379 Other intervertebral disc degeneration, lumbosacral region without mention of lumbar back pain or lower extremity pain: Secondary | ICD-10-CM

## 2018-09-03 MED ORDER — MELOXICAM 15 MG PO TABS: 15.0000 mg | ORAL_TABLET | Freq: Every day | ORAL | 0 refills | Status: DC

## 2018-09-03 MED ORDER — HYDROCODONE-ACETAMINOPHEN 7.5-325 MG PO TABS: 1.0000 | ORAL_TABLET | Freq: Three times a day (TID) | ORAL | 0 refills | Status: DC | PRN

## 2018-09-03 MED ORDER — BACLOFEN 10 MG PO TABS: 10.0000 mg | ORAL_TABLET | Freq: Every day | ORAL | 0 refills | Status: DC

## 2018-09-03 MED FILL — MELOXICAM 15 MG TABLET: 15 | 30 days supply | Qty: 30 | Fill #0

## 2018-09-03 MED FILL — BACLOFEN 10 MG TABS: 10 | 30 days supply | Qty: 30 | Fill #0

## 2018-09-03 NOTE — Progress Notes (Signed)
Safety precautions to be maintained throughout the outpatient stay will include: orient to surroundings, keep bed in low position, maintain call bell within reach at all times, provide assistance with transfer out of bed and ambulation.  

## 2018-09-03 NOTE — Patient Instructions (Addendum)
______________________________________________________________________________________________  Specialty Pain Scale  Introduction:  There are significant differences in how pain is reported. The word pain usually refers to physical pain, but it is also a common synonym of suffering. The medical community uses a scale from 0 (zero) to 10 (ten) to report pain level. Zero (0) is described as "no pain", while ten (10) is described as "the worse pain you can imagine". The problem with this scale is that physical pain is reported along with suffering. Suffering refers to mental pain, or more often yet it refers to any unpleasant feeling, emotion or aversion associated with the perception of harm or threat of harm. It is the psychological component of pain.  Pain Specialists prefer to separate the two components. The pain scale used by this practice is the Verbal Numerical Rating Scale (VNRS-11). This scale is for the physical pain only. DO NOT INCLUDE how your pain psychologically affects you. This scale is for adults 21 years of age and older. It has 11 (eleven) levels. The 1st level is 0/10. This means: "right now, I have no pain". In the context of pain management, it also means: "right now, my physical pain is under control with the current therapy".  General Information:  The scale should reflect your current level of pain. Unless you are specifically asked for the level of your worst pain, or your average pain. If you are asked for one of these two, then it should be understood that it is over the past 24 hours.  Levels 1 (one) through 5 (five) are described below, and can be treated as an outpatient. Ambulatory pain management facilities such as ours are more than adequate to treat these levels. Levels 6 (six) through 10 (ten) are also described below, however, these must be treated as a hospitalized patient. While levels 6 (six) and 7 (seven) may be evaluated at an urgent care facility, levels 8  (eight) through 10 (ten) constitute medical emergencies and as such, they belong in a hospital's emergency department. When having these levels (as described below), do not come to our office. Our facility is not equipped to manage these levels. Go directly to an urgent care facility or an emergency department to be evaluated.  Definitions:  Activities of Daily Living (ADL): Activities of daily living (ADL or ADLs) is a term used in healthcare to refer to people's daily self-care activities. Health professionals often use a person's ability or inability to perform ADLs as a measurement of their functional status, particularly in regard to people post injury, with disabilities and the elderly. There are two ADL levels: Basic and Instrumental. Basic Activities of Daily Living (BADL  or BADLs) consist of self-care tasks that include: Bathing and showering; personal hygiene and grooming (including brushing/combing/styling hair); dressing; Toilet hygiene (getting to the toilet, cleaning oneself, and getting back up); eating and self-feeding (not including cooking or chewing and swallowing); functional mobility, often referred to as "transferring", as measured by the ability to walk, get in and out of bed, and get into and out of a chair; the broader definition (moving from one place to another while performing activities) is useful for people with different physical abilities who are still able to get around independently. Basic ADLs include the things many people do when they get up in the morning and get ready to go out of the house: get out of bed, go to the toilet, bathe, dress, groom, and eat. On the average, loss of function typically follows a particular order.   Hygiene is the first to go, followed by loss of toilet use and locomotion. The last to go is the ability to eat. When there is only one remaining area in which the person is independent, there is a 62.9% chance that it is eating and only a 3.5% chance  that it is hygiene. Instrumental Activities of Daily Living (IADL or IADLs) are not necessary for fundamental functioning, but they let an individual live independently in a community. IADL consist of tasks that include: cleaning and maintaining the house; home establishment and maintenance; care of others (including selecting and supervising caregivers); care of pets; child rearing; managing money; managing financials (investments, etc.); meal preparation and cleanup; shopping for groceries and necessities; moving within the community; safety procedures and emergency responses; health management and maintenance (taking prescribed medications); and using the telephone or other form of communication.  Instructions:  Most patients tend to report their pain as a combination of two factors, their physical pain and their psychosocial pain. This last one is also known as "suffering" and it is reflection of how physical pain affects you socially and psychologically. From now on, report them separately.  From this point on, when asked to report your pain level, report only your physical pain. Use the following table for reference.  Pain Clinic Pain Levels (0-5/10)  Pain Level Score  Description  No Pain 0   Mild pain 1 Nagging, annoying, but does not interfere with basic activities of daily living (ADL). Patients are able to eat, bathe, get dressed, toileting (being able to get on and off the toilet and perform personal hygiene functions), transfer (move in and out of bed or a chair without assistance), and maintain continence (able to control bladder and bowel functions). Blood pressure and heart rate are unaffected. A normal heart rate for a healthy adult ranges from 60 to 100 bpm (beats per minute).   Mild to moderate pain 2 Noticeable and distracting. Impossible to hide from other people. More frequent flare-ups. Still possible to adapt and function close to normal. It can be very annoying and may have  occasional stronger flare-ups. With discipline, patients may get used to it and adapt.   Moderate pain 3 Interferes significantly with activities of daily living (ADL). It becomes difficult to feed, bathe, get dressed, get on and off the toilet or to perform personal hygiene functions. Difficult to get in and out of bed or a chair without assistance. Very distracting. With effort, it can be ignored when deeply involved in activities.   Moderately severe pain 4 Impossible to ignore for more than a few minutes. With effort, patients may still be able to manage work or participate in some social activities. Very difficult to concentrate. Signs of autonomic nervous system discharge are evident: dilated pupils (mydriasis); mild sweating (diaphoresis); sleep interference. Heart rate becomes elevated (>115 bpm). Diastolic blood pressure (lower number) rises above 100 mmHg. Patients find relief in laying down and not moving.   Severe pain 5 Intense and extremely unpleasant. Associated with frowning face and frequent crying. Pain overwhelms the senses.  Ability to do any activity or maintain social relationships becomes significantly limited. Conversation becomes difficult. Pacing back and forth is common, as getting into a comfortable position is nearly impossible. Pain wakes you up from deep sleep. Physical signs will be obvious: pupillary dilation; increased sweating; goosebumps; brisk reflexes; cold, clammy hands and feet; nausea, vomiting or dry heaves; loss of appetite; significant sleep disturbance with inability to fall asleep or to   remain asleep. When persistent, significant weight loss is observed due to the complete loss of appetite and sleep deprivation.  Blood pressure and heart rate becomes significantly elevated. Caution: If elevated blood pressure triggers a pounding headache associated with blurred vision, then the patient should immediately seek attention at an urgent or emergency care unit, as  these may be signs of an impending stroke.    Emergency Department Pain Levels (6-10/10)  Emergency Room Pain 6 Severely limiting. Requires emergency care and should not be seen or managed at an outpatient pain management facility. Communication becomes difficult and requires great effort. Assistance to reach the emergency department may be required. Facial flushing and profuse sweating along with potentially dangerous increases in heart rate and blood pressure will be evident.   Distressing pain 7 Self-care is very difficult. Assistance is required to transport, or use restroom. Assistance to reach the emergency department will be required. Tasks requiring coordination, such as bathing and getting dressed become very difficult.   Disabling pain 8 Self-care is no longer possible. At this level, pain is disabling. The individual is unable to do even the most "basic" activities such as walking, eating, bathing, dressing, transferring to a bed, or toileting. Fine motor skills are lost. It is difficult to think clearly.   Incapacitating pain 9 Pain becomes incapacitating. Thought processing is no longer possible. Difficult to remember your own name. Control of movement and coordination are lost.   The worst pain imaginable 10 At this level, most patients pass out from pain. When this level is reached, collapse of the autonomic nervous system occurs, leading to a sudden drop in blood pressure and heart rate. This in turn results in a temporary and dramatic drop in blood flow to the brain, leading to a loss of consciousness. Fainting is one of the body's self defense mechanisms. Passing out puts the brain in a calmed state and causes it to shut down for a while, in order to begin the healing process.    Summary: 1. Refer to this scale when providing [place] with your pain level. 2. Be accurate and careful when reporting your pain level. This will help with your care. 3. Over-reporting your pain level will  lead to loss of credibility. 4. Even a level of 1/10 means that there is pain and will be treated at our facility. 5. High, inaccurate reporting will be documented as "Symptom Exaggeration", leading to loss of credibility and suspicions of possible secondary gains such as obtaining more narcotics, or wanting to appear disabled, for fraudulent reasons. 6. Only pain levels of 5 or below will be seen at our facility. 7. Pain levels of 6 and above will be sent to the Emergency Department and the appointment cancelled. ______________________________________________________________________________________________   ____________________________________________________________________________________________  Preparing for Procedure with Sedation  Instructions: . Oral Intake: Do not eat or drink anything for at least 8 hours prior to your procedure. . Transportation: Public transportation is not allowed. Bring an adult driver. The driver must be physically present in our waiting room before any procedure can be started. Marland Kitchen Physical Assistance: Bring an adult physically capable of assisting you, in the event you need help. This adult should keep you company at home for at least 6 hours after the procedure. . Blood Pressure Medicine: Take your blood pressure medicine with a sip of water the morning of the procedure. . Blood thinners: Notify our staff if you are taking any blood thinners. Depending on which one you take, there will be  specific instructions on how and when to stop it. . Diabetics on insulin: Notify the staff so that you can be scheduled 1st case in the morning. If your diabetes requires high dose insulin, take only  of your normal insulin dose the morning of the procedure and notify the staff that you have done so. . Preventing infections: Shower with an antibacterial soap the morning of your procedure. . Build-up your immune system: Take 1000 mg of Vitamin C with every meal (3 times a day)  the day prior to your procedure. Marland Kitchen Antibiotics: Inform the staff if you have a condition or reason that requires you to take antibiotics before dental procedures. . Pregnancy: If you are pregnant, call and cancel the procedure. . Sickness: If you have a cold, fever, or any active infections, call and cancel the procedure. . Arrival: You must be in the facility at least 30 minutes prior to your scheduled procedure. . Children: Do not bring children with you. . Dress appropriately: Bring dark clothing that you would not mind if they get stained. . Valuables: Do not bring any jewelry or valuables.  Procedure appointments are reserved for interventional treatments only. Marland Kitchen No Prescription Refills. . No medication changes will be discussed during procedure appointments. . No disability issues will be discussed.  Reasons to call and reschedule or cancel your procedure: (Following these recommendations will minimize the risk of a serious complication.) . Surgeries: Avoid having procedures within 2 weeks of any surgery. (Avoid for 2 weeks before or after any surgery). . Flu Shots: Avoid having procedures within 2 weeks of a flu shots or . (Avoid for 2 weeks before or after immunizations). . Barium: Avoid having a procedure within 7-10 days after having had a radiological study involving the use of radiological contrast. (Myelograms, Barium swallow or enema study). . Heart attacks: Avoid any elective procedures or surgeries for the initial 6 months after a "Myocardial Infarction" (Heart Attack). . Blood thinners: It is imperative that you stop these medications before procedures. Let us know if you if you take any blood thinner.  . Infection: Avoid procedures during or within two weeks of an infection (including chest colds or gastrointestinal problems). Symptoms associated with infections include: Localized redness, fever, chills, night sweats or profuse sweating, burning sensation when voiding, cough,  congestion, stuffiness, runny nose, sore throat, diarrhea, nausea, vomiting, cold or Flu symptoms, recent or current infections. It is specially important if the infection is over the area that we intend to treat. Marland Kitchen Heart and lung problems: Symptoms that may suggest an active cardiopulmonary problem include: cough, chest pain, breathing difficulties or shortness of breath, dizziness, ankle swelling, uncontrolled high or unusually low blood pressure, and/or palpitations. If you are experiencing any of these symptoms, cancel your procedure and contact your primary care physician for an evaluation.  Remember:  Regular Business hours are:  Monday to Thursday 8:00 AM to 4:00 PM  Provider's Schedule: Milinda Pointer, MD:  Procedure days: Tuesday and Thursday 7:30 AM to 4:00 PM  Gillis Santa, MD:  Procedure days: Monday and Wednesday 7:30 AM to 4:00 PM ____________________________________________________________________________________________   ____________________________________________________________________________________________  General Risks and Possible Complications  Patient Responsibilities: It is important that you read this as it is part of your informed consent. It is our duty to inform you of the risks and possible complications associated with treatments offered to you. It is your responsibility as a patient to read this and to ask questions about anything that is not clear or  that you believe was not covered in this document.  Patient's Rights: You have the right to refuse treatment. You also have the right to change your mind, even after initially having agreed to have the treatment done. However, under this last option, if you wait until the last second to change your mind, you may be charged for the materials used up to that point.  Introduction: Medicine is not an Chief Strategy Officer. Everything in Medicine, including the lack of treatment(s), carries the potential for danger,  harm, or loss (which is by definition: Risk). In Medicine, a complication is a secondary problem, condition, or disease that can aggravate an already existing one. All treatments carry the risk of possible complications. The fact that a side effects or complications occurs, does not imply that the treatment was conducted incorrectly. It must be clearly understood that these can happen even when everything is done following the highest safety standards.  No treatment: You can choose not to proceed with the proposed treatment alternative. The "PRO(s)" would include: avoiding the risk of complications associated with the therapy. The "CON(s)" would include: not getting any of the treatment benefits. These benefits fall under one of three categories: diagnostic; therapeutic; and/or palliative. Diagnostic benefits include: getting information which can ultimately lead to improvement of the disease or symptom(s). Therapeutic benefits are those associated with the successful treatment of the disease. Finally, palliative benefits are those related to the decrease of the primary symptoms, without necessarily curing the condition (example: decreasing the pain from a flare-up of a chronic condition, such as incurable terminal cancer).  General Risks and Complications: These are associated to most interventional treatments. They can occur alone, or in combination. They fall under one of the following six (6) categories: no benefit or worsening of symptoms; bleeding; infection; nerve damage; allergic reactions; and/or death. 1. No benefits or worsening of symptoms: In Medicine there are no guarantees, only probabilities. No healthcare provider can ever guarantee that a medical treatment will work, they can only state the probability that it may. Furthermore, there is always the possibility that the condition may worsen, either directly, or indirectly, as a consequence of the treatment. 2. Bleeding: This is more common if  the patient is taking a blood thinner, either prescription or over the counter (example: Goody Powders, Fish oil, Aspirin, Garlic, etc.), or if suffering a condition associated with impaired coagulation (example: Hemophilia, cirrhosis of the liver, low platelet counts, etc.). However, even if you do not have one on these, it can still happen. If you have any of these conditions, or take one of these drugs, make sure to notify your treating physician. 3. Infection: This is more common in patients with a compromised immune system, either due to disease (example: diabetes, cancer, human immunodeficiency virus [HIV], etc.), or due to medications or treatments (example: therapies used to treat cancer and rheumatological diseases). However, even if you do not have one on these, it can still happen. If you have any of these conditions, or take one of these drugs, make sure to notify your treating physician. 4. Nerve Damage: This is more common when the treatment is an invasive one, but it can also happen with the use of medications, such as those used in the treatment of cancer. The damage can occur to small secondary nerves, or to large primary ones, such as those in the spinal cord and brain. This damage may be temporary or permanent and it may lead to impairments that can range from temporary  numbness to permanent paralysis and/or brain death. 5. Allergic Reactions: Any time a substance or material comes in contact with our body, there is the possibility of an allergic reaction. These can range from a mild skin rash (contact dermatitis) to a severe systemic reaction (anaphylactic reaction), which can result in death. 6. Death: In general, any medical intervention can result in death, most of the time due to an unforeseen  complication. ____________________________________________________________________________________________  ____________________________________________________________________________________________  Medication Rules  Purpose: To inform patients, and their family members, of our rules and regulations.  Applies to: All patients receiving prescriptions (written or electronic).  Pharmacy of record: Pharmacy where electronic prescriptions will be sent. If written prescriptions are taken to a different pharmacy, please inform the nursing staff. The pharmacy listed in the electronic medical record should be the one where you would like electronic prescriptions to be sent.  Electronic prescriptions: In compliance with the [place] (STOP) Act of 2017 (Session Lanny Cramp [phone]), effective August 01, 2018, all controlled substances must be electronically prescribed. Calling prescriptions to the pharmacy will cease to exist.  Prescription refills: Only during scheduled appointments. Applies to all prescriptions.  NOTE: The following applies primarily to controlled substances (Opioid* Pain Medications).   Patient's responsibilities: 1. Pain Pills: Bring all pain pills to every appointment (except for procedure appointments). 2. Pill Bottles: Bring pills in original pharmacy bottle. Always bring the newest bottle. Bring bottle, even if empty. 3. Medication refills: You are responsible for knowing and keeping track of what medications you take and those you need refilled. The day before your appointment: write a list of all prescriptions that need to be refilled. The day of the appointment: give the list to the admitting nurse. Prescriptions will be written only during appointments. If you forget a medication: it will not be "Called in", "Faxed", or "electronically sent". You will need to get another appointment to get these prescribed. No early refills. Do not  call asking to have your prescription filled early. 4. Prescription Accuracy: You are responsible for carefully inspecting your prescriptions before leaving our office. Have the discharge nurse carefully go over each prescription with you, before taking them home. Make sure that your name is accurately spelled, that your address is correct. Check the name and dose of your medication to make sure it is accurate. Check the number of pills, and the written instructions to make sure they are clear and accurate. Make sure that you are given enough medication to last until your next medication refill appointment. 5. Taking Medication: Take medication as prescribed. When it comes to controlled substances, taking less pills or less frequently than prescribed is permitted and encouraged. Never take more pills than instructed. Never take medication more frequently than prescribed.  6. Inform other Doctors: Always inform, all of your healthcare providers, of all the medications you take. 7. Pain Medication from other Providers: You are not allowed to accept any additional pain medication from any other Doctor or Healthcare provider. There are two exceptions to this rule. (see below) In the event that you require additional pain medication, you are responsible for notifying us, as stated below. 8. Medication Agreement: You are responsible for carefully reading and following our Medication Agreement. This must be signed before receiving any prescriptions from our practice. Safely store a copy of your signed Agreement. Violations to the Agreement will result in no further prescriptions. (Additional copies of our Medication Agreement are available upon request.) 9. Laws, Rules, & Regulations: All  patients are expected to follow all Federal and [employer], [employer], Rules, [employer]. Ignorance of the Laws does not constitute a valid excuse. The use of any illegal substances is prohibited. 10. Adopted CDC guidelines &  recommendations: Target dosing levels will be at or below 60 MME/day. Use of benzodiazepines** is not recommended.  Exceptions: There are only two exceptions to the rule of not receiving pain medications from other Healthcare Providers. 1. Exception #1 (Emergencies): In the event of an emergency (i.e.: accident requiring emergency care), you are allowed to receive additional pain medication. However, you are responsible for: As soon as you are able, call our office [phone], at any time of the day or night, and leave a message stating your name, the date and nature of the emergency, and the name and dose of the medication prescribed. In the event that your call is answered by a member of our staff, make sure to document and save the date, time, and the name of the person that took your information.  2. Exception #2 (Planned Surgery): In the event that you are scheduled by another doctor or dentist to have any type of surgery or procedure, you are allowed (for a period no longer than 30 days), to receive additional pain medication, for the acute post-op pain. However, in this case, you are responsible for picking up a copy of our "Post-op Pain Management for Surgeons" handout, and giving it to your surgeon or dentist. This document is available at our office, and does not require an appointment to obtain it. Simply go to our office during business hours (Monday-Thursday from 8:00 AM to 4:00 PM) (Friday 8:00 AM to 12:00 Noon) or if you have a scheduled appointment with [place], prior to your surgery, and ask for it by name. In addition, you will need to provide [place] with your name, name of your surgeon, type of surgery, and date of procedure or surgery.  *Opioid medications include: morphine, codeine, oxycodone, oxymorphone, hydrocodone, hydromorphone, meperidine, tramadol, tapentadol, buprenorphine, fentanyl, methadone. **Benzodiazepine medications include: diazepam (Valium), alprazolam (Xanax), clonazepam  (Klonopine), lorazepam (Ativan), clorazepate (Tranxene), chlordiazepoxide (Librium), estazolam (Prosom), oxazepam (Serax), temazepam (Restoril), triazolam (Halcion) (Last updated: 09/28/2017) ____________________________________________________________________________________________   ____________________________________________________________________________________________  Medication Recommendations and Reminders  Applies to: All patients receiving prescriptions (written and/or electronic).  Medication Rules & Regulations: These rules and regulations exist for your safety and that of others. They are not flexible and neither are we. Dismissing or ignoring them will be considered "non-compliance" with medication therapy, resulting in complete and irreversible termination of such therapy. (See document titled "Medication Rules" for more details.) In all conscience, because of safety reasons, we cannot continue providing a therapy where the patient does not follow instructions.  Pharmacy of record:   Definition: This is the pharmacy where your electronic prescriptions will be sent.   We do not endorse any particular pharmacy.  You are not restricted in your choice of pharmacy.  The pharmacy listed in the electronic medical record should be the one where you want electronic prescriptions to be sent.  If you choose to change pharmacy, simply notify our nursing staff of your choice of new pharmacy.  Recommendations:  Keep all of your pain medications in a safe place, under lock and key, even if you live alone.   After you fill your prescription, take 1 week's worth of pills and put them away in a safe place. You should keep a separate, properly labeled bottle for this purpose. The remainder should  be kept in the original bottle. Use this as your primary supply, until it runs out. Once it's gone, then you know that you have 1 week's worth of medicine, and it is time to come in for a  prescription refill. If you do this correctly, it is unlikely that you will ever run out of medicine.  To make sure that the above recommendation works, it is very important that you make sure your medication refill appointments are scheduled at least 1 week before you run out of medicine. To do this in an effective manner, make sure that you do not leave the office without scheduling your next medication management appointment. Always ask the nursing staff to show you in your prescription , when your medication will be running out. Then arrange for the receptionist to get you a return appointment, at least 7 days before you run out of medicine. Do not wait until you have 1 or 2 pills left, to come in. This is very poor planning and does not take into consideration that we may need to cancel appointments due to bad weather, sickness, or emergencies affecting our staff.  "Partial Fill": If for any reason your pharmacy does not have enough pills/tablets to completely fill or refill your prescription, do not allow for a "partial fill". You will need a separate prescription to fill the remaining amount, which we will not provide. If the reason for the partial fill is your insurance, you will need to talk to the pharmacist about payment alternatives for the remaining tablets, but again, do not accept a partial fill.  Prescription refills and/or changes in medication(s):   Prescription refills, and/or changes in dose or medication, will be conducted only during scheduled medication management appointments. (Applies to both, written and electronic prescriptions.)  No refills on procedure days. No medication will be changed or started on procedure days. No changes, adjustments, and/or refills will be conducted on a procedure day. Doing so will interfere with the diagnostic portion of the procedure.  No phone refills. No medications will be "called into the pharmacy".  No Fax refills.  No weekend  refills.  No Holliday refills.  No after hours refills.  Remember:  Business hours are:  Monday to Thursday 8:00 AM to 4:00 PM Provider's Schedule: Dionisio David, NP - Appointments are:  Medication management: Monday to Thursday 8:00 AM to 4:00 PM Milinda Pointer, MD - Appointments are:  Medication management: Monday and Wednesday 8:00 AM to 4:00 PM Procedure day: Tuesday and Thursday 7:30 AM to 4:00 PM Gillis Santa, MD - Appointments are:  Medication management: Tuesday and Thursday 8:00 AM to 4:00 PM Procedure day: Monday and Wednesday 7:30 AM to 4:00 PM (Last update: 09/28/2017) ____________________________________________________________________________________________   ____________________________________________________________________________________________  CANNABIDIOL (AKA: CBD Oil or Pills)  Applies to: All patients receiving prescriptions of controlled substances (written and/or electronic).  General Information: Cannabidiol (CBD) was discovered in 37. It is one of some 113 identified cannabinoids in cannabis (Marijuana) plants, accounting for up to 40% of the plant's extract. As of 2018, preliminary clinical research on cannabidiol included studies of anxiety, cognition, movement disorders, and pain.  Cannabidiol is consummed in multiple ways, including inhalation of cannabis smoke or vapor, as an aerosol spray into the cheek, and by mouth. It may be supplied as CBD oil containing CBD as the active ingredient (no added tetrahydrocannabinol (THC) or terpenes), a full-plant CBD-dominant hemp extract oil, capsules, dried cannabis, or as a liquid solution. CBD is thought not have the  same psychoactivity as THC, and may affect the actions of THC. Studies suggest that CBD may interact with different biological targets, including cannabinoid receptors and other neurotransmitter receptors. As of 2018 the mechanism of action for its biological effects has not been  determined.  In the [place], cannabidiol has a limited approval by the Food and Drug Administration (FDA) for treatment of only two types of epilepsy disorders. The side effects of long-term use of the drug include somnolence, decreased appetite, diarrhea, fatigue, malaise, weakness, sleeping problems, and others.  CBD remains a Schedule I drug prohibited for any use.  Legality: Some manufacturers ship CBD products nationally, an illegal action which the FDA has not enforced in 2018, with CBD remaining the subject of an FDA investigational new drug evaluation, and is not considered legal as a dietary supplement or food ingredient as of December 2018. Federal illegality has made it difficult historically to conduct research on CBD. CBD is openly sold in head shops and health food stores in some states where such sales have not been explicitly legalized.  Warning: Because it is not FDA approved for general use or treatment of pain, it is not required to undergo the same manufacturing controls as prescription drugs.  This means that the available cannabidiol (CBD) may be contaminated with THC.  If this is the case, it will trigger a positive urine drug screen (UDS) test for cannabinoids (Marijuana).  Because a positive UDS for illicit substances is a violation of our medication agreement, your opioid analgesics (pain medicine) may be permanently discontinued. (Last update: 10/19/2017) ____________________________________________________________________________________________   ____________________________________________________________________________________________  Medication Rules  Purpose: To inform patients, and their family members, of our rules and regulations.  Applies to: All patients receiving prescriptions (written or electronic).  Pharmacy of record: Pharmacy where electronic prescriptions will be sent. If written prescriptions are taken to a different pharmacy, please inform  the nursing staff. The pharmacy listed in the electronic medical record should be the one where you would like electronic prescriptions to be sent.  Electronic prescriptions: In compliance with the [place] (STOP) Act of 2017 (Session Lanny Cramp [phone]), effective August 01, 2018, all controlled substances must be electronically prescribed. Calling prescriptions to the pharmacy will cease to exist.  Prescription refills: Only during scheduled appointments. Applies to all prescriptions.  NOTE: The following applies primarily to controlled substances (Opioid* Pain Medications).   Patient's responsibilities: 11. Pain Pills: Bring all pain pills to every appointment (except for procedure appointments). 12. Pill Bottles: Bring pills in original pharmacy bottle. Always bring the newest bottle. Bring bottle, even if empty. 13. Medication refills: You are responsible for knowing and keeping track of what medications you take and those you need refilled. The day before your appointment: write a list of all prescriptions that need to be refilled. The day of the appointment: give the list to the admitting nurse. Prescriptions will be written only during appointments. If you forget a medication: it will not be "Called in", "Faxed", or "electronically sent". You will need to get another appointment to get these prescribed. No early refills. Do not call asking to have your prescription filled early. 14. Prescription Accuracy: You are responsible for carefully inspecting your prescriptions before leaving our office. Have the discharge nurse carefully go over each prescription with you, before taking them home. Make sure that your name is accurately spelled, that your address is correct. Check the name and dose of your medication to make sure it is accurate.  Check the number of pills, and the written instructions to make sure they are clear and accurate. Make sure that you  are given enough medication to last until your next medication refill appointment. 15. Taking Medication: Take medication as prescribed. When it comes to controlled substances, taking less pills or less frequently than prescribed is permitted and encouraged. Never take more pills than instructed. Never take medication more frequently than prescribed.  16. Inform other Doctors: Always inform, all of your healthcare providers, of all the medications you take. 17. Pain Medication from other Providers: You are not allowed to accept any additional pain medication from any other Doctor or Healthcare provider. There are two exceptions to this rule. (see below) In the event that you require additional pain medication, you are responsible for notifying us, as stated below. 18. Medication Agreement: You are responsible for carefully reading and following our Medication Agreement. This must be signed before receiving any prescriptions from our practice. Safely store a copy of your signed Agreement. Violations to the Agreement will result in no further prescriptions. (Additional copies of our Medication Agreement are available upon request.) 19. Laws, Rules, & Regulations: All patients are expected to follow all Federal and [employer], [employer], Rules, [employer]. Ignorance of the Laws does not constitute a valid excuse. The use of any illegal substances is prohibited. 20. Adopted CDC guidelines & recommendations: Target dosing levels will be at or below 60 MME/day. Use of benzodiazepines** is not recommended.  Exceptions: There are only two exceptions to the rule of not receiving pain medications from other Healthcare Providers. 3. Exception #1 (Emergencies): In the event of an emergency (i.e.: accident requiring emergency care), you are allowed to receive additional pain medication. However, you are responsible for: As soon as you are able, call our office [phone], at any time of the day or night, and  leave a message stating your name, the date and nature of the emergency, and the name and dose of the medication prescribed. In the event that your call is answered by a member of our staff, make sure to document and save the date, time, and the name of the person that took your information.  4. Exception #2 (Planned Surgery): In the event that you are scheduled by another doctor or dentist to have any type of surgery or procedure, you are allowed (for a period no longer than 30 days), to receive additional pain medication, for the acute post-op pain. However, in this case, you are responsible for picking up a copy of our "Post-op Pain Management for Surgeons" handout, and giving it to your surgeon or dentist. This document is available at our office, and does not require an appointment to obtain it. Simply go to our office during business hours (Monday-Thursday from 8:00 AM to 4:00 PM) (Friday 8:00 AM to 12:00 Noon) or if you have a scheduled appointment with [place], prior to your surgery, and ask for it by name. In addition, you will need to provide [place] with your name, name of your surgeon, type of surgery, and date of procedure or surgery.  *Opioid medications include: morphine, codeine, oxycodone, oxymorphone, hydrocodone, hydromorphone, meperidine, tramadol, tapentadol, buprenorphine, fentanyl, methadone. **Benzodiazepine medications include: diazepam (Valium), alprazolam (Xanax), clonazepam (Klonopine), lorazepam (Ativan), clorazepate (Tranxene), chlordiazepoxide (Librium), estazolam (Prosom), oxazepam (Serax), temazepam (Restoril), triazolam (Halcion) (Last updated: 09/28/2017) ____________________________________________________________________________________________   ____________________________________________________________________________________________  General Risks and Possible Complications  Patient Responsibilities: It is important that you read this as it is part of your informed  consent. It is our duty to inform you of the risks and possible complications associated with treatments offered to you. It is your responsibility as a patient to read this and to ask questions about anything that is not clear or that you believe was not covered in this document.  Patient's Rights: You have the right to refuse treatment. You also have the right to change your mind, even after initially having agreed to have the treatment done. However, under this last option, if you wait until the last second to change your mind, you may be charged for the materials used up to that point.  Introduction: Medicine is not an Chief Strategy Officer. Everything in Medicine, including the lack of treatment(s), carries the potential for danger, harm, or loss (which is by definition: Risk). In Medicine, a complication is a secondary problem, condition, or disease that can aggravate an already existing one. All treatments carry the risk of possible complications. The fact that a side effects or complications occurs, does not imply that the treatment was conducted incorrectly. It must be clearly understood that these can happen even when everything is done following the highest safety standards.  No treatment: You can choose not to proceed with the proposed treatment alternative. The "PRO(s)" would include: avoiding the risk of complications associated with the therapy. The "CON(s)" would include: not getting any of the treatment benefits. These benefits fall under one of three categories: diagnostic; therapeutic; and/or palliative. Diagnostic benefits include: getting information which can ultimately lead to improvement of the disease or symptom(s). Therapeutic benefits are those associated with the successful treatment of the disease. Finally, palliative benefits are those related to the decrease of the primary symptoms, without necessarily curing the condition (example: decreasing the pain from a flare-up of a chronic  condition, such as incurable terminal cancer).  General Risks and Complications: These are associated to most interventional treatments. They can occur alone, or in combination. They fall under one of the following six (6) categories: no benefit or worsening of symptoms; bleeding; infection; nerve damage; allergic reactions; and/or death. 7. No benefits or worsening of symptoms: In Medicine there are no guarantees, only probabilities. No healthcare provider can ever guarantee that a medical treatment will work, they can only state the probability that it may. Furthermore, there is always the possibility that the condition may worsen, either directly, or indirectly, as a consequence of the treatment. 8. Bleeding: This is more common if the patient is taking a blood thinner, either prescription or over the counter (example: Goody Powders, Fish oil, Aspirin, Garlic, etc.), or if suffering a condition associated with impaired coagulation (example: Hemophilia, cirrhosis of the liver, low platelet counts, etc.). However, even if you do not have one on these, it can still happen. If you have any of these conditions, or take one of these drugs, make sure to notify your treating physician. 9. Infection: This is more common in patients with a compromised immune system, either due to disease (example: diabetes, cancer, human immunodeficiency virus [HIV], etc.), or due to medications or treatments (example: therapies used to treat cancer and rheumatological diseases). However, even if you do not have one on these, it can still happen. If you have any of these conditions, or take one of these drugs, make sure to notify your treating physician. 10. Nerve Damage: This is more common when the treatment is an invasive one, but it can also happen with the use of medications, such as those used in the treatment of cancer.  The damage can occur to small secondary nerves, or to large primary ones, such as those in the spinal cord  and brain. This damage may be temporary or permanent and it may lead to impairments that can range from temporary numbness to permanent paralysis and/or brain death. 11. Allergic Reactions: Any time a substance or material comes in contact with our body, there is the possibility of an allergic reaction. These can range from a mild skin rash (contact dermatitis) to a severe systemic reaction (anaphylactic reaction), which can result in death. 12. Death: In general, any medical intervention can result in death, most of the time due to an unforeseen complication. ____________________________________________________________________________________________  Pain Management Discharge Instructions  General Discharge Instructions :  If you need to reach your doctor call: Monday-Friday 8:00 am - 4:00 pm at [phone] or toll free [phone].  After clinic hours [phone] to have operator reach doctor.  Bring all of your medication bottles to all your appointments in the pain clinic.  To cancel or reschedule your appointment with Pain Management please remember to call 24 hours in advance to avoid a fee.  Refer to the educational materials which you have been given on: General Risks, I had my Procedure. Discharge Instructions, Post Sedation.  Post Procedure Instructions:  The drugs you were given will stay in your system until tomorrow, so for the next 24 hours you should not drive, make any legal decisions or drink any alcoholic beverages.  You may eat anything you prefer, but it is better to start with liquids then soups and crackers, and gradually work up to solid foods.  Please notify your doctor immediately if you have any unusual bleeding, trouble breathing or pain that is not related to your normal pain.  Depending on the type of procedure that was done, some parts of your body may feel week and/or numb.  This usually clears up by tonight or the next day.  Walk with the use of an  assistive device or accompanied by an adult for the 24 hours.  You may use ice on the affected area for the first 24 hours.  Put ice in a Ziploc bag and cover with a towel and place against area 15 minutes on 15 minutes off.  You may switch to heat after 24 hours.Facet Blocks Patient Information  Description: The facets are joints in the spine between the vertebrae.  Like any joints in the body, facets can become irritated and painful.  Arthritis can also effect the facets.  By injecting steroids and local anesthetic in and around these joints, we can temporarily block the nerve supply to them.  Steroids act directly on irritated nerves and tissues to reduce selling and inflammation which often leads to decreased pain.  Facet blocks may be done anywhere along the spine from the neck to the low back depending upon the location of your pain.   After numbing the skin with local anesthetic (like Novocaine), a small needle is passed onto the facet joints under x-ray guidance.  You may experience a sensation of pressure while this is being done.  The entire block usually lasts about 15-25 minutes.   Conditions which may be treated by facet blocks:   Low back/buttock pain  Neck/shoulder pain  Certain types of headaches  Preparation for the injection:  1. Do not eat any solid food or dairy products within 8 hours of your appointment. 2. You may drink clear liquid up to 3 hours before appointment.  Clear liquids  include water, black coffee, juice or soda.  No milk or cream please. 3. You may take your regular medication, including pain medications, with a sip of water before your appointment.  Diabetics should hold regular insulin (if taken separately) and take 1/2 normal NPH dose the morning of the procedure.  Carry some sugar containing items with you to your appointment. 4. A driver must accompany you and be prepared to drive you home after your procedure. 5. Bring all your current medications with  you. 6. An IV may be inserted and sedation may be given at the discretion of the physician. 7. A blood pressure cuff, EKG and other monitors will often be applied during the procedure.  Some patients may need to have extra oxygen administered for a short period. 8. You will be asked to provide medical information, including your allergies and medications, prior to the procedure.  We must know immediately if you are taking blood thinners (like Coumadin/Warfarin) or if you are allergic to IV iodine contrast (dye).  We must know if you could possible be pregnant.  Possible side-effects:   Bleeding from needle site  Infection (rare, may require surgery)  Nerve injury (rare)  Numbness & tingling (temporary)  Difficulty urinating (rare, temporary)  Spinal headache (a headache worse with upright posture)  Light-headedness (temporary)  Pain at injection site (serveral days)  Decreased blood pressure (rare, temporary)  Weakness in arm/leg (temporary)  Pressure sensation in back/neck (temporary)   Call if you experience:   Fever/chills associated with headache or increased back/neck pain  Headache worsened by an upright position  New onset, weakness or numbness of an extremity below the injection site  Hives or difficulty breathing (go to the emergency room)  Inflammation or drainage at the injection site(s)  Severe back/neck pain greater than usual  New symptoms which are concerning to you  Please note:  Although the local anesthetic injected can often make your back or neck feel good for several hours after the injection, the pain will likely return. It takes 3-7 days for steroids to work.  You may not notice any pain relief for at least one week.  If effective, we will often do a series of 2-3 injections spaced 3-6 weeks apart to maximally decrease your pain.  After the initial series, you may be a candidate for a more permanent nerve block of the facets.  If you have  any questions, please call #[phone]) [facility]

## 2018-09-19 MED FILL — HYDROCODON-APAP 7.5-325: 7.5-325 | 30 days supply | Qty: 90 | Fill #0

## 2018-09-30 ENCOUNTER — Other Ambulatory Visit: Payer: Self-pay

## 2018-09-30 ENCOUNTER — Ambulatory Visit (HOSPITAL_COMMUNITY)
Admission: EM | Admit: 2018-09-30 | Discharge: 2018-09-30 | Disposition: A | Discharge status: Home / Self Care | Payer: No Typology Code available for payment source | Attending: Family Medicine | Admitting: Family Medicine

## 2018-09-30 ENCOUNTER — Encounter (HOSPITAL_COMMUNITY): Payer: Self-pay | Admitting: *Deleted

## 2018-09-30 DIAGNOSIS — J111 Influenza due to unidentified influenza virus with other respiratory manifestations: Secondary | ICD-10-CM

## 2018-09-30 DIAGNOSIS — R69 Illness, unspecified: Secondary | ICD-10-CM | POA: Diagnosis not present

## 2018-09-30 HISTORY — DX: Dorsalgia, unspecified: M54.9

## 2018-09-30 MED ORDER — BENZONATATE 100 MG PO CAPS: 100.0000 mg | ORAL_CAPSULE | Freq: Three times a day (TID) | ORAL | 0 refills | Status: DC

## 2018-09-30 MED ORDER — ONDANSETRON HCL 4 MG PO TABS: 4.0000 mg | ORAL_TABLET | Freq: Four times a day (QID) | ORAL | 0 refills | Status: DC

## 2018-09-30 MED ORDER — ONDANSETRON 4 MG PO TBDP
4.0000 mg | ORAL_TABLET | Freq: Once | ORAL | Status: AC
  Administered 2018-09-30: 4 mg via ORAL

## 2018-09-30 MED ORDER — ONDANSETRON 4 MG PO TBDP
ORAL_TABLET | ORAL | Status: AC
  Filled 2018-09-30: qty 1

## 2018-09-30 MED ORDER — OSELTAMIVIR PHOSPHATE 75 MG PO CAPS: 75.0000 mg | ORAL_CAPSULE | Freq: Two times a day (BID) | ORAL | 0 refills | Status: DC

## 2018-09-30 NOTE — Discharge Instructions (Signed)
Get plenty of rest and push fluids.  Drink at least half your body weight in ounces.  You may supplement with OTC Pedialyte or oral rehydration solution Tamiflu prescribed.  Take as directed and to completion Tessalon Perles prescribed for cough Zofran prescribed.  Use as needed for nausea You may continue with OTC medications as needed Use OTC tylenol and/or aleve every 4-6 hours for fever, body aches, and chills Follow up with PCP if symptoms persist Go to the ED if you have any new or worsening symptoms fever that does not moderate with tylenol, chills, nausea, vomiting, chest pain, worsening cough, shortness of breath, wheezing, abdominal pain, changes in bowel or bladder habits, etc..Marland Kitchen

## 2018-09-30 NOTE — ED Provider Notes (Signed)
[place]   [id number] 09/30/18 Arrival Time: 4098   CC: flu symptoms   SUBJECTIVE: History from: patient.  Denise Burns is a 48 y.o. female who presents with abrupt onset of runny nose, congestion, sore throat, cough, body aches, fever, tmax of 101, chills, and nausea x 2 days.  Admits to sick exposure to co-worker with the flu.  Has tried OTC cold medications without relief.  Reports previous symptoms in the past and diagnosed with flu. Denies sinus pain, chest pain, changes in bowel or bladder habits.   Received flu shot this year: no.  ROS: As per HPI.  Past Medical History:  Diagnosis Date  . Addison disease ([place])   . Arthritis   . Back pain   . GERD (gastroesophageal reflux disease)    Past Surgical History:  Procedure Laterality Date  . CHOLECYSTECTOMY    . POLYPECTOMY     Allergies  Allergen Reactions  . Lisinopril Cough  . Ultram [Tramadol] Other (See Comments)  . Sulfa Antibiotics Other (See Comments)   No current facility-administered medications on file prior to encounter.    Current Outpatient Medications on File Prior to Encounter  Medication Sig Dispense Refill  . baclofen (LIORESAL) 10 MG tablet Take 1 tablet (10 mg total) by mouth daily for 30 days. 30 tablet 0  . Cholecalciferol (VITAMIN D3) 50000 units TABS Take 1 tablet by mouth every 7 (seven) days. 12 tablet 3  . fexofenadine (ALLEGRA) 180 MG tablet Take 180 mg by mouth daily.    . fludrocortisone (FLORINEF) 0.1 MG tablet Take 0.05 mg by mouth daily.  3  . HYDROcodone-acetaminophen (NORCO) 7.5-325 MG tablet Take 1 tablet by mouth every 8 (eight) hours as needed for up to 30 days for moderate pain. Must last 30 days. 90 tablet 0  . hydrocortisone (CORTEF) 10 MG tablet Take 1.5 tablets (15 mg total) by mouth daily with breakfast AND 0.5 tablets (5 mg total) every evening. 180 tablet 3  . meloxicam (MOBIC) 15 MG tablet Take 1 tablet (15 mg total) by mouth daily for 30 days. 30 tablet 0  .  pseudoephedrine (SUDAFED) 30 MG tablet Take 30 mg by mouth every 4 (four) hours as needed for congestion.     Social History   Socioeconomic History  . Marital status: Married    Spouse name: Not on file  . Number of children: Not on file  . Years of education: Not on file  . Highest education level: Not on file  Occupational History  . Not on file  Social Needs  . Financial resource strain: Not on file  . Food insecurity:    Worry: Not on file    Inability: Not on file  . Transportation needs:    Medical: Not on file    Non-medical: Not on file  Tobacco Use  . Smoking status: Former Research scientist (life sciences)  . Smokeless tobacco: Never Used  Substance and Sexual Activity  . Alcohol use: Yes    Comment: occ  . Drug use: Never  . Sexual activity: Not on file  Lifestyle  . Physical activity:    Days per week: Not on file    Minutes per session: Not on file  . Stress: Not on file  Relationships  . Social connections:    Talks on phone: Not on file    Gets together: Not on file    Attends religious service: Not on file    Active member of club or organization: Not on file  Attends meetings of clubs or organizations: Not on file    Relationship status: Not on file  . Intimate partner violence:    Fear of current or ex partner: Not on file    Emotionally abused: Not on file    Physically abused: Not on file    Forced sexual activity: Not on file  Other Topics Concern  . Not on file  Social History Narrative  . Not on file   Family History  Problem Relation Age of Onset  . Arthritis Mother   . Hyperlipidemia Mother   . Hypertension Mother   . Alcohol abuse Father   . Hearing loss Father   . Hyperlipidemia Father     OBJECTIVE:  Vitals:   09/30/18 1406  BP: 129/81  Pulse: (!) 115  Resp: (!) 22  Temp: (!) 101.2 F (38.4 C)  TempSrc: Oral  SpO2: 98%     General appearance: alert; appears fatigued, but nontoxic; speaking in full sentences and tolerating own  secretions HEENT: NCAT; Ears: EACs clear, TMs pearly gray; Eyes: PERRL.  EOM grossly intact. Nose: nares patent with clear rhinorrhea, Throat: oropharynx clear, tonsils non erythematous or enlarged, uvula midline  Neck: supple without LAD Lungs: unlabored respirations, symmetrical air entry; cough: mild; no respiratory distress; CTAB Heart: Tachcardic Skin: warm and dry Psychological: alert and cooperative; normal mood and affect  ASSESSMENT & PLAN:  1. Influenza-like illness     Meds ordered this encounter  Medications  . ondansetron (ZOFRAN-ODT) disintegrating tablet 4 mg  . oseltamivir (TAMIFLU) 75 MG capsule    Sig: Take 1 capsule (75 mg total) by mouth every 12 (twelve) hours.    Dispense:  10 capsule    Refill:  0    Order Specific Question:   Supervising Provider    Answer:   Raylene Everts [[id number]]  . ondansetron (ZOFRAN) 4 MG tablet    Sig: Take 1 tablet (4 mg total) by mouth every 6 (six) hours.    Dispense:  12 tablet    Refill:  0    Order Specific Question:   Supervising Provider    Answer:   Raylene Everts [[id number]]  . benzonatate (TESSALON) 100 MG capsule    Sig: Take 1 capsule (100 mg total) by mouth every 8 (eight) hours.    Dispense:  21 capsule    Refill:  0    Order Specific Question:   Supervising Provider    Answer:   Raylene Everts [[id number]]   Get plenty of rest and push fluids.  Drink at least half your body weight in ounces.  You may supplement with OTC Pedialyte or oral rehydration solution Tamiflu prescribed.  Take as directed and to completion Tessalon Perles prescribed for cough Zofran prescribed.  Use as needed for nausea You may continue with OTC medications as needed Use OTC tylenol and/or aleve every 4-6 hours for fever, body aches, and chills Follow up with PCP if symptoms persist Go to the ED if you have any new or worsening symptoms fever that does not moderate with tylenol, chills, nausea, vomiting, chest pain, worsening  cough, shortness of breath, wheezing, abdominal pain, changes in bowel or bladder habits, etc...   Reviewed expectations re: course of current medical issues. Questions answered. Outlined signs and symptoms indicating need for more acute intervention. Patient verbalized understanding. After Visit Summary given.         Lestine Box, PA-C 09/30/18 1435

## 2018-09-30 NOTE — ED Triage Notes (Signed)
Reports starting with fever up to 101 yesterday with congestion, cough, body aches, head pressure.  C/O having some wheezing and at times SOB.  Last dose Tyl @ 0900.

## 2018-10-01 ENCOUNTER — Telehealth: Payer: Self-pay

## 2018-10-01 ENCOUNTER — Telehealth: Payer: Self-pay | Admitting: Pain Medicine

## 2018-10-01 NOTE — Telephone Encounter (Signed)
Noted  

## 2018-10-01 NOTE — Telephone Encounter (Signed)
[facility] Patient Name: Denise Burns Gender: Female DOB: 29-Jul-1971 Age: 48 Y 11 M 23 D Return Phone Number: [phone] (Primary) [place] [address] Client [facility] - Client Client Site [place] Physician Tor Netters- NP Contact Type Call Who Is Calling Patient / Member / Family / Caregiver Call Type Triage / Clinical Relationship To Patient Self Return Phone Number [phone] (Primary) Chief Complaint Flu Symptom Reason for Call Symptomatic / Request for [place] states she needs a referral for urgent care. She has the flu. Translation No Nurse Assessment Nurse: Andree Moro, RN, Anderson [place] Date/Time (Eastern Time): 09/30/2018 9:45:22 AM Confirm and document reason for call. If symptomatic, describe symptoms. ---Pt states she has a headache, fever 101 (oral), dry coughing, all over achiness, wheezing, body aches and chills. Has the patient traveled to [place] OR had close contact with a person known to have the novel coronavirus illness in the last 14 days? ---No Does the patient have any new or worsening symptoms? ---Yes Will a triage be completed? ---Yes Related visit to physician within the last 2 weeks? ---No Does the PT have any chronic conditions? (i.e. diabetes, asthma, this includes High risk factors for pregnancy, etc.) ---Yes List chronic conditions. ---Addisons disease Is the patient pregnant or possibly pregnant? (Ask all females between the ages of 52-55) ---No Is this a behavioral health or substance abuse call? ---No Guidelines Guideline Title Affirmed Question Affirmed Notes Nurse Date/Time (Eastern Time) Influenza - Seasonal [1] Fever > 100.0 F (37.8 C) AND [2] diabetes mellitus or weak immune system (e.g., HIV positive, cancer chemo,  splenectomy, organ transplant, chronic steroids) Boylan, RN, Anderson Malta 09/30/2018 9:47:45 AM PLEASE NOTE: All timestamps contained within this report are represented as [place] Standard Time. CONFIDENTIALTY NOTICE: This fax transmission is intended only for the addressee. It contains information that is legally privileged, confidential or otherwise protected from use or disclosure. If you are not the intended recipient, you are strictly prohibited from reviewing, disclosing, copying using or disseminating any of this information or taking any action in reliance on or regarding this information. If you have received this fax in error, please notify us immediately by telephone so that we can arrange for its return to [place]. Phone: [phone], Toll-Free: [phone], Fax: [phone] Page: 2 of 2 Call Id: [id number] [place]. Time Eilene Ghazi Time) Disposition Final User 09/30/2018 9:50:09 AM See HCP within 4 Hours (or PCP triage) Yes Andree Moro, RN, Nell [place] Disagree/Comply Comply Caller Understands Yes PreDisposition Go to Urgent Care/Walk-In Clinic Care Advice Given Per Guideline SEE HCP WITHIN 4 HOURS (OR PCP TRIAGE): * IF OFFICE WILL BE CLOSED AND NO PCP (PRIMARY CARE PROVIDER) SECOND-LEVEL TRIAGE: You need to be seen within the next 3 or 4 hours. A nearby Urgent Care Center [facility]) is often a good source of care. Another choice is to go to the ED. Go sooner if you become worse. FEVER MEDICINE - ACETAMINOPHEN: * Fever above 101 F (38.3 C) should be treated with acetaminophen (e.g., Tylenol). This can be taken by mouth as pills or per rectum using a suppository. Both are available over the counter. Usual adult dose is 650 mg by mouth or per rectum every 6 hours. CALL BACK IF: * You become worse. CARE ADVICE given per INFLUENZA - SEASONAL (Adult) guideline. Referrals [facility] [place]

## 2018-10-01 NOTE — Telephone Encounter (Signed)
Per chart review tab pt was seen [facility] on 09/30/18.

## 2018-10-01 NOTE — Telephone Encounter (Signed)
Dr. Kathrin Ruddy on Friday 09-28-18 asking for peer to peer review of request to give patient a procedure. Please give to Dr. Dossie Arbour   Dr. Marney Setting [phone]

## 2018-10-03 ENCOUNTER — Telehealth: Payer: Self-pay | Admitting: *Deleted

## 2018-10-03 ENCOUNTER — Ambulatory Visit (INDEPENDENT_AMBULATORY_CARE_PROVIDER_SITE_OTHER): Payer: No Typology Code available for payment source | Admitting: Family Medicine

## 2018-10-03 ENCOUNTER — Encounter: Payer: Self-pay | Admitting: Family Medicine

## 2018-10-03 VITALS — BP 152/86 | HR 112 | Temp 98.4°F | Ht 63.25 in | Wt 182.5 lb

## 2018-10-03 DIAGNOSIS — R05 Cough: Secondary | ICD-10-CM

## 2018-10-03 DIAGNOSIS — J111 Influenza due to unidentified influenza virus with other respiratory manifestations: Secondary | ICD-10-CM

## 2018-10-03 DIAGNOSIS — R110 Nausea: Secondary | ICD-10-CM

## 2018-10-03 DIAGNOSIS — R059 Cough, unspecified: Secondary | ICD-10-CM

## 2018-10-03 MED ORDER — HYDROCODONE-HOMATROPINE 5-1.5 MG/5ML PO SYRP: 5.0000 mL | ORAL_SOLUTION | Freq: Three times a day (TID) | ORAL | 0 refills | Status: DC | PRN

## 2018-10-03 MED ORDER — PROMETHAZINE HCL 25 MG PO TABS: 25.0000 mg | ORAL_TABLET | Freq: Three times a day (TID) | ORAL | 0 refills | Status: DC | PRN

## 2018-10-03 NOTE — Progress Notes (Signed)
Subjective:    Patient ID: Denise Burns, female    DOB: Dec 09, 1970, 48 y.o.   MRN: [id number]  HPI This is a 48 yo female who was diagnosed with influenza 09/30/18. She was given Tamiflu and ondasetron. Unable to take Tamiflu due to nausea. No vomiting. Generalized body aches. Taking some tylenol. Ears hurt. Pain with cough. No improvement with Tessalon. Took some phenergan with more relief than tessalon. Takes meloxicam 15 mg daily. Dry cough, feels congested. Fever 101 highest last two days. No wheeze or SOB, feels winded with activity.   Past Medical History:  Diagnosis Date  . Addison disease ([place])   . Arthritis   . Back pain   . GERD (gastroesophageal reflux disease)    Past Surgical History:  Procedure Laterality Date  . CHOLECYSTECTOMY    . POLYPECTOMY     Family History  Problem Relation Age of Onset  . Arthritis Mother   . Hyperlipidemia Mother   . Hypertension Mother   . Alcohol abuse Father   . Hearing loss Father   . Hyperlipidemia Father    Social History   Tobacco Use  . Smoking status: Former Research scientist (life sciences)  . Smokeless tobacco: Never Used  Substance Use Topics  . Alcohol use: Yes    Comment: occ  . Drug use: Never      Review of Systems Per HPI    Objective:   Physical Exam Vitals signs reviewed.  Constitutional:      General: She is not in acute distress.    Appearance: She is well-developed. She is obese. She is ill-appearing. She is not toxic-appearing or diaphoretic.  HENT:     Head: Normocephalic and atraumatic.     Right Ear: Tympanic membrane, ear canal and external ear normal.     Left Ear: Tympanic membrane, ear canal and external ear normal.     Nose: Congestion and rhinorrhea present.     Mouth/Throat:     Mouth: Mucous membranes are moist.     Pharynx: Oropharynx is clear.  Eyes:     Conjunctiva/sclera: Conjunctivae normal.  Neck:     Musculoskeletal: Normal range of motion. No neck rigidity or muscular tenderness.     Vascular: No  carotid bruit.  Cardiovascular:     Rate and Rhythm: Normal rate and regular rhythm.     Heart sounds: Normal heart sounds.  Pulmonary:     Effort: Pulmonary effort is normal.     Breath sounds: Normal breath sounds.  Lymphadenopathy:     Cervical: No cervical adenopathy.  Skin:    General: Skin is warm and dry.  Neurological:     Mental Status: She is alert and oriented to person, place, and time.  Psychiatric:        Mood and Affect: Mood normal.        Behavior: Behavior normal.        Thought Content: Thought content normal.        Judgment: Judgment normal.       BP (!) 152/86   Pulse (!) 112   Temp 98.4 F (36.9 C) (Oral)   Ht 5' 3.25" (1.607 m)   Wt 182 lb 8 oz (82.8 kg)   LMP 06/08/2018 (Exact Date)   SpO2 96%   BMI 32.07 kg/m  Wt Readings from Last 3 Encounters:  10/03/18 182 lb 8 oz (82.8 kg)  09/03/18 190 lb (86.2 kg)  08/16/18 190 lb (86.2 kg)       Assessment &  Plan:  1. Nausea without vomiting - discussed potential side effects - promethazine (PHENERGAN) 25 MG tablet; Take 1 tablet (25 mg total) by mouth every 8 (eight) hours as needed for nausea or vomiting.  Dispense: 20 tablet; Refill: 0  2. Cough - HYDROcodone-homatropine (HYCODAN) 5-1.5 MG/5ML syrup; Take 5 mLs by mouth every 8 (eight) hours as needed for cough.  Dispense: 120 mL; Refill: 0  3. Influenza - reviewed RTC/ER precautions, symptomatic relief, course of illness - encouraged increased fluid intake   Clarene Reamer, FNP-BC  [facility] [place]  10/07/2018 6:12 PM

## 2018-10-03 NOTE — Patient Instructions (Signed)
Good to see you, I'm sorry you are feeling so poorly!  Work to increase hydrating aggressively to make urine light in color and bring heart rate to below 100  I have sent in more phenergan as well as a prescription cough syrup to your pharmacy  Can take acetaminophen up to 3,000 mg daily  If not better in 48 hours, please let me know

## 2018-10-03 NOTE — Progress Notes (Signed)
Subjective:    Patient ID: Denise Burns, female    DOB: 1970-10-15, 48 y.o.   MRN: [id number]  HPI This is a 48 yo female being seen today for flu-like symptoms. Was seen at urgent care on 09/30/18 for symptoms and treated with Tamiflu but could not take it because she was too nauseated. She has not vomited but is nauseated. Zofran did not help, she had some phenergan from before that did help her with the nausea.     She is experiencing random sharp shooting pains on the right side of face by temple, complains of headache that is worsened by a non-productive cough. Has been taking mobic 15 mg daily for pain. Feels feverish and a congested, with a non-productive cough, T-max of 101(F). She has not been able to eat anything for 5 days, and has little fluid intake.  Marland Kitchen Past Medical History:  Diagnosis Date  . Addison disease ([place])   . Arthritis   . Back pain   . GERD (gastroesophageal reflux disease)    Past Surgical History:  Procedure Laterality Date  . CHOLECYSTECTOMY    . POLYPECTOMY     Family History  Problem Relation Age of Onset  . Arthritis Mother   . Hyperlipidemia Mother   . Hypertension Mother   . Alcohol abuse Father   . Hearing loss Father   . Hyperlipidemia Father    Social History   Tobacco Use  . Smoking status: Former Research scientist (life sciences)  . Smokeless tobacco: Never Used  Substance Use Topics  . Alcohol use: Yes    Comment: occ  . Drug use: Never      Review of Systems Per HPI    Objective:   Physical Exam Vitals signs reviewed.  Constitutional:      Appearance: She is well-developed. She is ill-appearing.  HENT:     Mouth/Throat:     Comments: Dry mucus membranes Neck:     Musculoskeletal: Neck supple.  Cardiovascular:     Rate and Rhythm: Regular rhythm. Tachycardia present.  Pulmonary:     Effort: Pulmonary effort is normal.     Breath sounds: Normal breath sounds and air entry.  Skin:    General: Skin is warm and dry.  Neurological:     Mental  Status: She is alert and oriented to person, place, and time.  Psychiatric:        Attention and Perception: Attention normal.        Mood and Affect: Mood normal.        Behavior: Behavior normal.     BP (!) 152/86   Pulse (!) 112   Temp 98.4 F (36.9 C) (Oral)   Ht 5' 3.25" (1.607 m)   Wt 182 lb 8 oz (82.8 kg)   LMP 06/08/2018 (Exact Date)   SpO2 96%   BMI 32.07 kg/m   BP Readings from Last 3 Encounters:  10/03/18 (!) 152/86  09/30/18 129/81  09/03/18 131/85   BP Readings from Last 3 Encounters:  10/03/18 (!) 152/86  09/30/18 129/81  09/03/18 131/85   Nausea without vomiting - Plan: promethazine (PHENERGAN) 25 MG tablet  Cough - Plan: HYDROcodone-homatropine (HYCODAN) 5-1.5 MG/5ML syrup  Patient Instructions  Good to see you, I'm sorry you are feeling so poorly!  Work to increase hydrating aggressively to make urine light in color and bring heart rate to below 100  I have sent in more phenergan as well as a prescription cough syrup to your pharmacy  Can take  acetaminophen up to 3,000 mg daily  If not better in 48 hours, please let me know       Assessment & Plan:

## 2018-10-05 ENCOUNTER — Encounter: Payer: Self-pay | Admitting: Family Medicine

## 2018-10-07 ENCOUNTER — Encounter: Payer: Self-pay | Admitting: Family Medicine

## 2018-10-08 ENCOUNTER — Ambulatory Visit: Payer: No Typology Code available for payment source | Admitting: Family Medicine

## 2018-10-09 ENCOUNTER — Other Ambulatory Visit: Payer: Self-pay | Admitting: Family Medicine

## 2018-10-09 ENCOUNTER — Encounter: Payer: Self-pay | Admitting: Family Medicine

## 2018-10-09 DIAGNOSIS — R05 Cough: Secondary | ICD-10-CM

## 2018-10-09 DIAGNOSIS — R059 Cough, unspecified: Secondary | ICD-10-CM

## 2018-10-09 MED ORDER — HYDROCODONE-HOMATROPINE 5-1.5 MG/5ML PO SYRP: 5.0000 mL | ORAL_SOLUTION | Freq: Three times a day (TID) | ORAL | 0 refills | Status: DC | PRN

## 2018-10-09 MED FILL — HYDROCODONE-HOMATROPINE SYR: 5-1.5 | 6 days supply | Qty: 120 | Fill #0

## 2018-10-09 MED FILL — VITAMIN D3 50000 UNIT CAPS: 1.25 MG | 84 days supply | Qty: 12 | Fill #2

## 2018-10-09 MED FILL — FLUDROCORTISONE 0.1 MG TAB: 0.1 | 90 days supply | Qty: 45 | Fill #2

## 2018-10-09 MED FILL — BACLOFEN 10 MG TABS: 10 | 30 days supply | Qty: 45 | Fill #2

## 2018-10-17 ENCOUNTER — Other Ambulatory Visit: Payer: Self-pay

## 2018-10-17 ENCOUNTER — Ambulatory Visit: Payer: No Typology Code available for payment source | Attending: Nurse Practitioner | Admitting: Nurse Practitioner

## 2018-10-17 ENCOUNTER — Encounter: Payer: Self-pay | Admitting: Nurse Practitioner

## 2018-10-17 VITALS — BP 127/90 | HR 94 | Temp 98.0°F | Resp 16 | Ht 64.0 in | Wt 185.0 lb

## 2018-10-17 DIAGNOSIS — G8929 Other chronic pain: Secondary | ICD-10-CM | POA: Insufficient documentation

## 2018-10-17 DIAGNOSIS — M7918 Myalgia, other site: Secondary | ICD-10-CM | POA: Insufficient documentation

## 2018-10-17 DIAGNOSIS — M461 Sacroiliitis, not elsewhere classified: Secondary | ICD-10-CM

## 2018-10-17 DIAGNOSIS — G894 Chronic pain syndrome: Secondary | ICD-10-CM | POA: Diagnosis present

## 2018-10-17 DIAGNOSIS — M47818 Spondylosis without myelopathy or radiculopathy, sacral and sacrococcygeal region: Secondary | ICD-10-CM | POA: Insufficient documentation

## 2018-10-17 DIAGNOSIS — M47816 Spondylosis without myelopathy or radiculopathy, lumbar region: Secondary | ICD-10-CM | POA: Diagnosis not present

## 2018-10-17 MED ORDER — HYDROCODONE-ACETAMINOPHEN 7.5-325 MG PO TABS: 1.0000 | ORAL_TABLET | Freq: Three times a day (TID) | ORAL | 0 refills | Status: DC | PRN

## 2018-10-17 MED ORDER — MELOXICAM 15 MG PO TABS: 15.0000 mg | ORAL_TABLET | Freq: Every day | ORAL | 0 refills | Status: DC

## 2018-10-17 MED FILL — MELOXICAM 15 MG TABLET: 15 | 30 days supply | Qty: 30 | Fill #0

## 2018-10-17 NOTE — Patient Instructions (Signed)
____________________________________________________________________________________________  Medication Rules  Purpose: To inform patients, and their family members, of our rules and regulations.  Applies to: All patients receiving prescriptions (written or electronic).  Pharmacy of record: Pharmacy where electronic prescriptions will be sent. If written prescriptions are taken to a different pharmacy, please inform the nursing staff. The pharmacy listed in the electronic medical record should be the one where you would like electronic prescriptions to be sent.  Electronic prescriptions: In compliance with the [place] Strengthen Opioid Misuse Prevention (STOP) Act of 2017 (Session Law 2017-74/H243), effective August 01, 2018, all controlled substances must be electronically prescribed. Calling prescriptions to the pharmacy will cease to exist.  Prescription refills: Only during scheduled appointments. Applies to all prescriptions.  NOTE: The following applies primarily to controlled substances (Opioid* Pain Medications).   Patient's responsibilities: 1. Pain Pills: Bring all pain pills to every appointment (except for procedure appointments). 2. Pill Bottles: Bring pills in original pharmacy bottle. Always bring the newest bottle. Bring bottle, even if empty. 3. Medication refills: You are responsible for knowing and keeping track of what medications you take and those you need refilled. The day before your appointment: write a list of all prescriptions that need to be refilled. The day of the appointment: give the list to the admitting nurse. Prescriptions will be written only during appointments. No prescriptions will be written on procedure days. If you forget a medication: it will not be "Called in", "Faxed", or "electronically sent". You will need to get another appointment to get these prescribed. No early refills. Do not call asking to have your prescription filled  early. 4. Prescription Accuracy: You are responsible for carefully inspecting your prescriptions before leaving our office. Have the discharge nurse carefully go over each prescription with you, before taking them home. Make sure that your name is accurately spelled, that your address is correct. Check the name and dose of your medication to make sure it is accurate. Check the number of pills, and the written instructions to make sure they are clear and accurate. Make sure that you are given enough medication to last until your next medication refill appointment. 5. Taking Medication: Take medication as prescribed. When it comes to controlled substances, taking less pills or less frequently than prescribed is permitted and encouraged. Never take more pills than instructed. Never take medication more frequently than prescribed.  6. Inform other Doctors: Always inform, all of your healthcare providers, of all the medications you take. 7. Pain Medication from other Providers: You are not allowed to accept any additional pain medication from any other Doctor or Healthcare provider. There are two exceptions to this rule. (see below) In the event that you require additional pain medication, you are responsible for notifying us, as stated below. 8. Medication Agreement: You are responsible for carefully reading and following our Medication Agreement. This must be signed before receiving any prescriptions from our practice. Safely store a copy of your signed Agreement. Violations to the Agreement will result in no further prescriptions. (Additional copies of our Medication Agreement are available upon request.) 9. Laws, Rules, & Regulations: All patients are expected to follow all Federal and State Laws, Statutes, Rules, & Regulations. Ignorance of the Laws does not constitute a valid excuse. The use of any illegal substances is prohibited. 10. Adopted CDC guidelines & recommendations: Target dosing levels will be  at or below 60 MME/day. Use of benzodiazepines** is not recommended.  Exceptions: There are only two exceptions to the rule of not   receiving pain medications from other Healthcare Providers. 1. Exception #1 (Emergencies): In the event of an emergency (i.e.: accident requiring emergency care), you are allowed to receive additional pain medication. However, you are responsible for: As soon as you are able, call our office [phone], at any time of the day or night, and leave a message stating your name, the date and nature of the emergency, and the name and dose of the medication prescribed. In the event that your call is answered by a member of our staff, make sure to document and save the date, time, and the name of the person that took your information.  2. Exception #2 (Planned Surgery): In the event that you are scheduled by another doctor or dentist to have any type of surgery or procedure, you are allowed (for a period no longer than 30 days), to receive additional pain medication, for the acute post-op pain. However, in this case, you are responsible for picking up a copy of our "Post-op Pain Management for Surgeons" handout, and giving it to your surgeon or dentist. This document is available at our office, and does not require an appointment to obtain it. Simply go to our office during business hours (Monday-Thursday from 8:00 AM to 4:00 PM) (Friday 8:00 AM to 12:00 Noon) or if you have a scheduled appointment with us, prior to your surgery, and ask for it by name. In addition, you will need to provide us with your name, name of your surgeon, type of surgery, and date of procedure or surgery.  *Opioid medications include: morphine, codeine, oxycodone, oxymorphone, hydrocodone, hydromorphone, meperidine, tramadol, tapentadol, buprenorphine, fentanyl, methadone. **Benzodiazepine medications include: diazepam (Valium), alprazolam (Xanax), clonazepam (Klonopine), lorazepam (Ativan), clorazepate  (Tranxene), chlordiazepoxide (Librium), estazolam (Prosom), oxazepam (Serax), temazepam (Restoril), triazolam (Halcion) (Last updated: 09/28/2017) ____________________________________________________________________________________________   ____________________________________________________________________________________________  Appointment Policy Summary  It is our goal and responsibility to provide the medical community with assistance in the evaluation and management of patients with chronic pain. Unfortunately our resources are limited. Because we do not have an unlimited amount of time, or available appointments, we are required to closely monitor and manage their use. The following rules exist to maximize their use:  Patient's responsibilities: 1. Punctuality:  At what time should I arrive? You should be physically present in our office 30 minutes before your scheduled appointment. Your scheduled appointment is with your assigned healthcare provider. However, it takes 5-10 minutes to be "checked-in", and another 15 minutes for the nurses to do the admission. If you arrive to our office at the time you were given for your appointment, you will end up being at least 20-25 minutes late to your appointment with the provider. 2. Tardiness:  What happens if I arrive only a few minutes after my scheduled appointment time? You will need to reschedule your appointment. The cutoff is your appointment time. This is why it is so important that you arrive at least 30 minutes before that appointment. If you have an appointment scheduled for 10:00 AM and you arrive at 10:01, you will be required to reschedule your appointment.  3. Plan ahead:  Always assume that you will encounter traffic on your way in. Plan for it. If you are dependent on a driver, make sure they understand these rules and the need to arrive early. 4. Other appointments and responsibilities:  Avoid scheduling any other appointments  before or after your pain clinic appointments.  5. Be prepared:  Write down everything that you need to discuss   with your healthcare provider and give this information to the admitting nurse. Write down the medications that you will need refilled. Bring your pills and bottles (even the empty ones), to all of your appointments, except for those where a procedure is scheduled. 6. No children or pets:  Find someone to take care of them. It is not appropriate to bring them in. 7. Scheduling changes:  We request "advanced notification" of any changes or cancellations. 8. Advanced notification:  Defined as a time period of more than 24 hours prior to the originally scheduled appointment. This allows for the appointment to be offered to other patients. 9. Rescheduling:  When a visit is rescheduled, it will require the cancellation of the original appointment. For this reason they both fall within the category of "Cancellations".  10. Cancellations:  They require advanced notification. Any cancellation less than 24 hours before the  appointment will be recorded as a "No Show". 11. No Show:  Defined as an unkept appointment where the patient failed to notify or declare to the practice their intention or inability to keep the appointment.  Corrective process for repeat offenders:  1. Tardiness: Three (3) episodes of rescheduling due to late arrivals will be recorded as one (1) "No Show". 2. Cancellation or reschedule: Three (3) cancellations or rescheduling will be recorded as one (1) "No Show". 3. "No Shows": Three (3) "No Shows" within a 12 month period will result in discharge from the practice. ____________________________________________________________________________________________   

## 2018-10-17 NOTE — Progress Notes (Signed)
Patient's Name: Denise Burns  MRN: [id number]  Referring Provider: Elby Beck, FNP  DOB: [birth year]  PCP: Elby Beck, FNP  DOS: 10/17/2018  Note by: Dionisio David, NP  Service setting: Ambulatory outpatient  Specialty: Interventional Pain Management  Location: [facility] (AMB) Pain Management Facility    Patient type: Established   HPI  Reason for Visit: [name] is a 48 y.o. year old, female patient, who comes today with a chief complaint of Back Pain (lumbar, right is worse ) Last Appointment: Her last appointment at our practice was on 10/03/2018. I last saw her on 08/16/2018.  Pain Assessment: Today, [name] describes the severity of the Chronic pain as a 4 /10. She indicates the location/referral of the pain to be Back Lower, Left, Right/down hips and legs, especially when she is climbing stairs.  . Onset was: More than a month ago. The quality of pain is described as Discomfort, Radiating, Constant, Aching(weakness in thighs when climbing stairs.  ). Temporal description, or timing of pain is: Constant. Possible modifying factors: medications, sitting or laying down . [name]'s  height is 5' 4"  (1.626 m) and weight is 185 lb (83.9 kg). Her oral temperature is 98 F (36.7 C). Her blood pressure is 127/90 and her pulse is 94. Her respiration is 16 and oxygen saturation is 100%. She admits that her lumbar facet nerve block was declined by her insurance.  She feels like her pain is getting worse.  She noticed more pain going down the legs with bending.  She denies any numbness or tingling in the legs.  She is having some weakness.  She did complete physical therapy last in November.  She felt like this was effective.  She is concerned about the next steps with her treatment.  Controlled Substance Pharmacotherapy Assessment REMS (Risk Evaluation and Mitigation Strategy)  Analgesic: Hydrocodone/APAP 7.5/325 1 tablet p.o. 3 times daily (22.5 mg/day of  hydrocodone) MME/day:22.75m/day  PJanett Billow RN  10/17/2018  8:51 AM  Sign when Signing Visit Nursing Pain Medication Assessment:  Safety precautions to be maintained throughout the outpatient stay will include: orient to surroundings, keep bed in low position, maintain call bell within reach at all times, provide assistance with transfer out of bed and ambulation.  Medication Inspection Compliance: Pill count conducted under aseptic conditions, in front of the patient. Neither the pills nor the bottle was removed from the patient's sight at any time. Once count was completed pills were immediately returned to the patient in their original bottle.  Medication: Hydrocodone/APAP Pill/Patch Count: 5 of 90 pills remain Pill/Patch Appearance: Markings consistent with prescribed medication Bottle Appearance: Standard pharmacy container. Clearly labeled. Filled Date: 02 / 19 / 2020 Last Medication intake:  Today   Pharmacokinetics: Liberation and absorption (onset of action): WNL Distribution (time to peak effect): WNL Metabolism and excretion (duration of action): WNL         Pharmacodynamics: Desired effects: Analgesia: [name] >50% benefit. Functional ability: Patient reports that medication allows her to accomplish basic ADLs Clinically meaningful improvement in function (CMIF): Sustained CMIF goals met Perceived effectiveness: Described as relatively effective, allowing for increase in activities of daily living (ADL) Undesirable effects: Side-effects or Adverse reactions: None reported Monitoring: [name] PMP: Online review of the past 149-montheriod conducted. Compliant with practice rules and regulations Last UDS on record: Summary  Date Value Ref Range Status  08/16/2018 FINAL  Final    Comment:    ==================================================================== TOX[facility]RE COMP DRUG  ANALYSIS,UR ==================================================================== Test                             Result       Flag       Units Drug Present and Declared for Prescription Verification   Hydrocodone                    205          EXPECTED   ng/mg creat   Hydromorphone                  671          EXPECTED   ng/mg creat   Dihydrocodeine                 153          EXPECTED   ng/mg creat   Norhydrocodone                 642          EXPECTED   ng/mg creat    Sources of hydrocodone include scheduled prescription    medications. Hydromorphone, dihydrocodeine and norhydrocodone are    expected metabolites of hydrocodone. Hydromorphone and    dihydrocodeine are also available as scheduled prescription    medications.   Ephedrine/Pseudoephedrine      PRESENT      EXPECTED   Phenylpropanolamine            PRESENT      EXPECTED    Source of ephedrine/pseudoephedrine is most commonly    pseudoephedrine in over-the-counter or prescription cold and    allergy medications. Phenylpropanolamine is an expected    metabolite of ephedrine/pseudoephedrine.   Baclofen                       PRESENT      EXPECTED   Acetaminophen                  PRESENT      EXPECTED Drug Present not Declared for Prescription Verification   Ibuprofen                      PRESENT      UNEXPECTED   Doxylamine                     PRESENT      UNEXPECTED   Dextrorphan/Levorphanol        PRESENT      UNEXPECTED    Dextrorphan is an expected metabolite of dextromethorphan, an    over-the-counter or prescription cough suppressant. Levorphanol    is a scheduled prescription medication. Dextrorphan cannot be    distinguished from levorphanol by the method used for analysis. ==================================================================== Test                      Result    Flag   Units      Ref Range   Creatinine              234              mg/dL       >=20 ==================================================================== Declared Medications:  The flagging and interpretation on this report are based on the  following declared medications.  Unexpected results may arise from  inaccuracies in the declared medications.  **Note:  The testing scope of this panel includes these medications:  Baclofen  Hydrocodone (Hydrocodone-Acetaminophen)  Pseudoephedrine (Sudafed)  **Note: The testing scope of this panel does not include small to  moderate amounts of these reported medications:  Acetaminophen (Hydrocodone-Acetaminophen)  **Note: The testing scope of this panel does not include following  reported medications:  Cholecalciferol  Fexofenadine  Fludrocortisone (Florinef)  Hydrocortisone (Cortef) ==================================================================== For clinical consultation, please call [phone]. ====================================================================    UDS interpretation: Compliant          Medication Assessment Form: Reviewed. Patient indicates being compliant with therapy Treatment compliance: Compliant Risk Assessment Profile: Aberrant behavior: See initial evaluations. None observed or detected today Comorbid factors increasing risk of overdose: See initial evaluation. No additional risks detected today Opioid risk tool (ORT):  Opioid Risk  09/03/2018  Alcohol 1  Illegal Drugs 0  Rx Drugs 0  Alcohol 0  Illegal Drugs 0  Rx Drugs 0  Age between 16-45 years  0  History of Preadolescent Sexual Abuse 0  Psychological Disease 0  Depression 0  Opioid Risk Tool Scoring 1  Opioid Risk Interpretation Low Risk    ORT Scoring interpretation table:  Score <3 = Low Risk for SUD  Score between 4-7 = Moderate Risk for SUD  Score >8 = High Risk for Opioid Abuse   Risk of substance use disorder (SUD): Low  Risk Mitigation Strategies:  Patient Counseling: Covered Patient-Prescriber Agreement  (PPA): Present and active  Notification to other healthcare providers: Done  Pharmacologic Plan: No change in therapy, at this time.             ROS  Constitutional: Denies any fever or chills Gastrointestinal: No reported hemesis, hematochezia, vomiting, or acute GI distress Musculoskeletal: Denies any acute onset joint swelling, redness, loss of ROM, or weakness Neurological: No reported episodes of acute onset apraxia, aphasia, dysarthria, agnosia, amnesia, paralysis, loss of coordination, or loss of consciousness  Medication Review  HYDROcodone-acetaminophen, Vitamin D3, baclofen, benzonatate, fexofenadine, fludrocortisone, hydrocortisone, meloxicam, and pseudoephedrine  History Review  Allergy: Ms. Lye is allergic to lisinopril; ultram [tramadol]; and sulfa antibiotics. Drug: Ms. Natarajan  reports no history of drug use. Alcohol:  reports current alcohol use. Tobacco:  reports that she has quit smoking. She has never used smokeless tobacco. Social: Ms. Kretsch  reports that she has quit smoking. She has never used smokeless tobacco. She reports current alcohol use. She reports that she does not use drugs. Medical:  has a past medical history of Addison disease ([place]), Arthritis, Back pain, and GERD (gastroesophageal reflux disease). Surgical: Ms. Tullo  has a past surgical history that includes Cholecystectomy and Polypectomy. Family: family history includes Alcohol abuse in her father; Arthritis in her mother; Hearing loss in her father; Hyperlipidemia in her father and mother; Hypertension in her mother. Problem List: Ms. Klassen has Chronic low back pain (Primary Area of Pain) (Bilateral) (R>L) w/o sciatica; Chronic sacroiliac joint pain (Secondary Area of Pain) (Right); Chronic pain syndrome; and Lumbar spondylosis on their pertinent problem list.  Lab Review  Kidney Function Lab Results  Component Value Date   BUN 17 08/16/2018   CREATININE 0.92 08/16/2018   BCR 18  08/16/2018   GFRAA 86 08/16/2018   GFRNONAA 74 08/16/2018  Liver Function Lab Results  Component Value Date   AST 18 08/16/2018   ALBUMIN 4.4 08/16/2018  Note: Above Lab results reviewed.  Imaging Review  DG Si Joints CLINICAL DATA:  Chronic right sacroiliac joint pain.  EXAM: BILATERAL SACROILIAC  JOINTS - 3+ VIEW  COMPARISON:  MRI lumbar spine 03/22/2018  FINDINGS: Sacroiliac joint spaces are maintained bilaterally without osteolysis or ankylosis. Mild degenerative changes along the caudal aspect of the SI joints. Probable facet arthropathy near the lumbosacral junction but incompletely characterized on this examination.  IMPRESSION: Symmetric appearance of the SI joints without significant arthropathy.  Electronically Signed   By: Markus Daft M.D.   On: 08/16/2018 16:22 Note: Reviewed        Physical Exam  General appearance: Well nourished, well developed, and well hydrated. In no apparent acute distress Mental status: Alert, oriented x 3 (person, place, & time)       Respiratory: No evidence of acute respiratory distress Eyes: PERLA Vitals: BP 127/90 (BP Location: Right Arm, Patient Position: Sitting, Cuff Size: Normal)   Pulse 94   Temp 98 F (36.7 C) (Oral)   Resp 16   Ht 5' 4"  (1.626 m)   Wt 185 lb (83.9 kg)   LMP 06/08/2018 (Exact Date)   SpO2 100%   BMI 31.76 kg/m  BMI: Estimated body mass index is 31.76 kg/m as calculated from the following:   Height as of this encounter: 5' 4"  (1.626 m).   Weight as of this encounter: 185 lb (83.9 kg). Ideal: Ideal body weight: 54.7 kg (120 lb 9.5 oz) Adjusted ideal body weight: 66.4 kg (146 lb 5.7 oz) Lumbar Spine Area Exam  Skin & Axial Inspection: No masses, redness, or swelling Alignment: Symmetrical Functional ROM: Unrestricted ROM       Stability: No instability detected Muscle Tone/Strength: Functionally intact. No obvious neuro-muscular anomalies detected. Sensory (Neurological): Unimpaired Palpation:  Complains of area being tender to palpation       Provocative Tests: Hyperextension/rotation test: Positive bilaterally for facet joint pain. Lumbar quadrant test (Kemp's test): deferred today       Lateral bending test: deferred today       Patrick's Maneuver: deferred today                    Gait & Posture Assessment  Ambulation: Unassisted Gait: Relatively normal for age and body habitus Posture: WNL  Lower Extremity Exam    Side: Right lower extremity  Side: Left lower extremity  Stability: No instability observed          Stability: No instability observed          Skin & Extremity Inspection: Skin color, temperature, and hair growth are WNL. No peripheral edema or cyanosis. No masses, redness, swelling, asymmetry, or associated skin lesions. No contractures.  Skin & Extremity Inspection: Skin color, temperature, and hair growth are WNL. No peripheral edema or cyanosis. No masses, redness, swelling, asymmetry, or associated skin lesions. No contractures.  Functional ROM: Unrestricted ROM                  Functional ROM: Unrestricted ROM                  Muscle Tone/Strength: Functionally intact. No obvious neuro-muscular anomalies detected.  Muscle Tone/Strength: Functionally intact. No obvious neuro-muscular anomalies detected.  Sensory (Neurological): Referred pain pattern        Sensory (Neurological): Referred pain pattern            Palpation: No palpable anomalies  Palpation: No palpable anomalies   Assessment   Status Diagnosis  Controlled Controlled Controlled 1. Lumbar spondylosis   2. Osteoarthritis of sacroiliac joint (Bilateral)   3. Chronic musculoskeletal pain   4.  Chronic pain syndrome   5. Osteoarthritis of facet joint of lumbar spine      Updated Problems: Problem  Lumbar Spondylosis    Plan of Care  Pharmacotherapy (Medications Ordered): Meds ordered this encounter  Medications  . HYDROcodone-acetaminophen (NORCO) 7.5-325 MG tablet    Sig: Take 1  tablet by mouth every 8 (eight) hours as needed for up to 30 days for moderate pain. Must last 30 days.    Dispense:  90 tablet    Refill:  0    Media STOP ACT - Not applicable. Fill one day early if pharmacy is closed on scheduled refill date. Do not fill until: 09/19/18. Must last 30 days. To last until: 10/19/18.    Order Specific Question:   Supervising Provider    Answer:   Milinda Pointer [phone]  . meloxicam (MOBIC) 15 MG tablet    Sig: Take 1 tablet (15 mg total) by mouth daily for 30 days.    Dispense:  30 tablet    Refill:  0    Do not add this medication to the electronic "Automatic Refill" notification system. Patient may have prescription filled one day early if pharmacy is closed on scheduled refill date.    Order Specific Question:   Supervising Provider    Answer:   Milinda Pointer [[id number]]   Administered today: Braulio Conte had no medications administered during this visit.  Orders:  No orders of the defined types were placed in this encounter.  Follow-up plan:   Return in about 4 weeks (around 11/14/2018) for MedMgmt, w/ Dr. Dossie Arbour.  For treatment evaluation secondary to her lumbar facet nerve blocks been declined by insurance.   Interventional options: Considering:   Diagnostic bilateral lumbar facet nerve block  Possible bilateral lumbar facet RFA  Diagnostic bilateral sacroiliac joint injection  Possible bilateral sacroiliac joint RFA  Diagnostic bilateral L5 transforaminal LESI  Diagnostic left-sided L2 transforaminal LESI    PRN Procedures:   Diagnostic bilateral lumbar facet nerve block #1 under fluoroscopic guidance and IV sedation   Note by: Dionisio David, NP Date: 10/17/2018; Time: 10:19 AM

## 2018-10-17 NOTE — Progress Notes (Signed)
Nursing Pain Medication Assessment:  Safety precautions to be maintained throughout the outpatient stay will include: orient to surroundings, keep bed in low position, maintain call bell within reach at all times, provide assistance with transfer out of bed and ambulation.  Medication Inspection Compliance: Pill count conducted under aseptic conditions, in front of the patient. Neither the pills nor the bottle was removed from the patient's sight at any time. Once count was completed pills were immediately returned to the patient in their original bottle.  Medication: Hydrocodone/APAP Pill/Patch Count: 5 of 90 pills remain Pill/Patch Appearance: Markings consistent with prescribed medication Bottle Appearance: Standard pharmacy container. Clearly labeled. Filled Date: 02 / 19 / 2020 Last Medication intake:  Today

## 2018-10-19 MED FILL — HYDROCODON-APAP 7.5-325: 7.5-325 | 30 days supply | Qty: 90 | Fill #0

## 2018-10-26 MED FILL — HYDROCORTISONE 10 MG TABLET: 10 | 90 days supply | Qty: 180 | Fill #1

## 2018-11-14 ENCOUNTER — Other Ambulatory Visit: Payer: Self-pay

## 2018-11-14 ENCOUNTER — Telehealth: Payer: Self-pay | Admitting: Pain Medicine

## 2018-11-14 ENCOUNTER — Ambulatory Visit: Payer: No Typology Code available for payment source | Attending: Pain Medicine | Admitting: Pain Medicine

## 2018-11-14 DIAGNOSIS — M461 Sacroiliitis, not elsewhere classified: Secondary | ICD-10-CM

## 2018-11-14 DIAGNOSIS — M47816 Spondylosis without myelopathy or radiculopathy, lumbar region: Secondary | ICD-10-CM | POA: Diagnosis not present

## 2018-11-14 DIAGNOSIS — M47818 Spondylosis without myelopathy or radiculopathy, sacral and sacrococcygeal region: Secondary | ICD-10-CM

## 2018-11-14 DIAGNOSIS — G8929 Other chronic pain: Secondary | ICD-10-CM

## 2018-11-14 DIAGNOSIS — M7918 Myalgia, other site: Secondary | ICD-10-CM

## 2018-11-14 DIAGNOSIS — M4310 Spondylolisthesis, site unspecified: Secondary | ICD-10-CM

## 2018-11-14 DIAGNOSIS — M545 Low back pain, unspecified: Secondary | ICD-10-CM

## 2018-11-14 DIAGNOSIS — G894 Chronic pain syndrome: Secondary | ICD-10-CM

## 2018-11-14 DIAGNOSIS — M4300 Spondylolysis, site unspecified: Secondary | ICD-10-CM

## 2018-11-14 DIAGNOSIS — M533 Sacrococcygeal disorders, not elsewhere classified: Secondary | ICD-10-CM | POA: Diagnosis not present

## 2018-11-14 MED ORDER — MELOXICAM 15 MG PO TABS: 15.0000 mg | ORAL_TABLET | Freq: Every day | ORAL | 0 refills | Status: DC

## 2018-11-14 MED ORDER — HYDROCODONE-ACETAMINOPHEN 7.5-325 MG PO TABS: 1.0000 | ORAL_TABLET | Freq: Three times a day (TID) | ORAL | 0 refills | Status: DC | PRN

## 2018-11-14 MED ORDER — BACLOFEN 10 MG PO TABS: 10.0000 mg | ORAL_TABLET | Freq: Every day | ORAL | 0 refills | Status: DC

## 2018-11-14 NOTE — Telephone Encounter (Signed)
Pt called and stated that [facility] told her that her prescriptions for baclofen and meloxicam need to go to [facility].

## 2018-11-14 NOTE — Progress Notes (Signed)
Pain Management Virtual Encounter Note - Virtual Visit via Telephone Telehealth (real-time audio visits between healthcare provider and patient).  Patient's Phone No. & Preferred Pharmacy:  [phone] (home); [phone] (mobile); (Preferred) [phone]  [place], [place] [address] Phone: [phone] Fax: [phone]  [facility] [place], [place] [address] Phone: [phone] Fax: [phone]   Pre-screening note:  Our staff contacted Ms. Apuzzo and offered her an "in person", "face-to-face" appointment versus a telephone encounter. She indicated preferring the telephone encounter, at this time.  Reason for Virtual Visit: COVID-19*  Social distancing based on CDC and AMA recommendations.   I contacted Alli Jasmer on 11/14/2018 at 9:23 AM by telephone and clearly identified myself as [name], MD. I verified that I was speaking with the correct person using two identifiers (Name and date of birth: Jun 24, 1971).  Advanced Informed Consent I sought verbal advanced consent from Braulio Conte for telemedicine interactions and virtual visit. I informed Ms. Guerin of the security and privacy concerns, risks, and limitations associated with performing an evaluation and management service by telephone. I also informed Ms. Vanwagoner of the availability of "in person" appointments and I informed her of the possibility of a patient responsible charge related to this service. Ms. Higinbotham expressed understanding and agreed to proceed.   Historic Elements   Ms. Shalanda Brogden is a 48 y.o. year old, female patient evaluated today after her last encounter by our practice on 10/17/2018. Ms. Geppert  has a past medical history of Addison disease ([place]), Arthritis, Back pain, and GERD  (gastroesophageal reflux disease). She also  has a past surgical history that includes Cholecystectomy and Polypectomy. [name] has a current medication list which includes the following prescription(s): baclofen, benzonatate, vitamin d3, fexofenadine, fludrocortisone, hydrocodone-acetaminophen, meloxicam, and pseudoephedrine. She  reports that she has quit smoking. She has never used smokeless tobacco. She reports current alcohol use. She reports that she does not use drugs. Ms. Amend is allergic to lisinopril; ultram [tramadol]; and sulfa antibiotics.   HPI  I last saw her on 10/01/2018. She is being evaluated for medication management.  The patient is continued to have symptoms and in fact they are getting worse.  Unfortunately, I cannot treat her with oral steroids as she is already taking those on a daily basis secondary to her Addison's disease.  This patient has all of the indications and has met all of the requirements to have the diagnostic bilateral lumbar facet block.  Therefore, it is beyond me, and begun to put on the part of our insurance company to deny this diagnostic procedure.  The patient has already had an unsuccessful l trial of physical therapy, she has pain on hyperextension and rotation of the lumbar spine on the physical exam, and she has imaging with positive findings showing lumbar facet arthropathy.  We really do not want to keep this patient on opioid analgesics for the rest of her life and therefore the solution to this problem is to do 2 diagnostic lumbar facet blocks, assuming that the first is positive in proving that the patient has facet arthralgia, and then proceed with lumbar facet radiofrequency ablation for the purpose of providing her with long-term benefit.  This is a well-known established protocol used by the American Society of interventional pain physicians, of which I am  a board certified member.  Because of this I am clearly stating here that it is my  professional opinion as a board certified pain specialist that this patient needs to have the ordered procedure and it is medically necessary for her to do so.  Not approving for this patient to have this treatment, goes against my orders as the patient's attending physician.  Having a non-pain management specialist going against my medical advice, as a board-certified expert in the field, and attempting to use a medical protocol to deny this treatment, without having personally taken a history and personally having done a physical exam on this patient, amount to medical malpractice as well as unethical professional conduct.  Having this decision made by nonmedical personnel amounts to practicing medicine without a license, which of course is illegal in the state of [place].  Pharmacotherapy Assessment  Analgesic: Hydrocodone/APAP 7.5/325 1 tablet p.o. 3 times daily (22.5 mg/day of hydrocodone) MME/day:22.64m/day  Monitoring: Pharmacotherapy: No side-effects or adverse reactions reported.  PMP: PDMP reviewed during this encounter. Unfortunately, the PMP reveals that the patient has continued to get Hydrocodone-Homatropine Syrup from her PCP, and violation of our medication agreement.  Today the patient has been given a final warning and a second copy of our medication rules and regulations.  Her primary care physician has been sent a copy of this note and letter notifying her that should she continue prescribing cough medicine with opioids, we will be forced to terminate the patient's medication agreement and transfer her medication management back to her PCP. Compliance: Noncompliant.  See above. Plan: Refer to "POC".  Review of recent tests  DG Si Joints CLINICAL DATA:  Chronic right sacroiliac joint pain.  EXAM: BILATERAL SACROILIAC JOINTS - 3+ VIEW  COMPARISON:  MRI lumbar spine 03/22/2018  FINDINGS: Sacroiliac joint spaces are maintained bilaterally without osteolysis or  ankylosis. Mild degenerative changes along the caudal aspect of the SI joints. Probable facet arthropathy near the lumbosacral junction but incompletely characterized on this examination.  IMPRESSION: Symmetric appearance of the SI joints without significant arthropathy.  Electronically Signed   By: AMarkus DaftM.D.   On: 08/16/2018 16:22   Office Visit on 08/16/2018  Component Date Value Ref Range Status  . Summary 08/16/2018 FINAL   Final   Comment: ==================================================================== [facility] COMP DRUG ANALYSIS,UR ==================================================================== Test                             Result       Flag       Units Drug Present and Declared for Prescription Verification   Hydrocodone                    205          EXPECTED   ng/mg creat   Hydromorphone                  671          EXPECTED   ng/mg creat   Dihydrocodeine                 153          EXPECTED   ng/mg creat   Norhydrocodone                 642          EXPECTED   ng/mg creat    Sources of hydrocodone include scheduled prescription  medications. Hydromorphone, dihydrocodeine and norhydrocodone are    expected metabolites of hydrocodone. Hydromorphone and    dihydrocodeine are also available as scheduled prescription    medications.   Ephedrine/Pseudoephedrine      PRESENT      EXPECTED   Phenylpropanolamine            PRESENT      EXPECTED    Source of ephedrine/pseudoephedrine is most                           commonly    pseudoephedrine in over-the-counter or prescription cold and    allergy medications. Phenylpropanolamine is an expected    metabolite of ephedrine/pseudoephedrine.   Baclofen                       PRESENT      EXPECTED   Acetaminophen                  PRESENT      EXPECTED Drug Present not Declared for Prescription Verification   Ibuprofen                      PRESENT      UNEXPECTED   Doxylamine                     PRESENT       UNEXPECTED   Dextrorphan/Levorphanol        PRESENT      UNEXPECTED    Dextrorphan is an expected metabolite of dextromethorphan, an    over-the-counter or prescription cough suppressant. Levorphanol    is a scheduled prescription medication. Dextrorphan cannot be    distinguished from levorphanol by the method used for analysis. ==================================================================== Test                      Result    Flag   Units      Ref Range   Creatinine              234              mg/dL      >=20 =============                          ======================================================= Declared Medications:  The flagging and interpretation on this report are based on the  following declared medications.  Unexpected results may arise from  inaccuracies in the declared medications.  **Note: The testing scope of this panel includes these medications:  Baclofen  Hydrocodone (Hydrocodone-Acetaminophen)  Pseudoephedrine (Sudafed)  **Note: The testing scope of this panel does not include small to  moderate amounts of these reported medications:  Acetaminophen (Hydrocodone-Acetaminophen)  **Note: The testing scope of this panel does not include following  reported medications:  Cholecalciferol  Fexofenadine  Fludrocortisone (Florinef)  Hydrocortisone (Cortef) ==================================================================== For clinical consultation, please call [phone]. ====================================================================   . Glucose 08/16/2018 97  65 - 99 mg/dL Final  . BUN 08/16/2018 17  6 - 24 mg/dL Final  . Creatinine, Ser 08/16/2018 0.92  0.57 - 1.00 mg/dL Final  . GFR calc non Af Amer 08/16/2018 74  >59 mL/min/1.73 Final  . GFR calc Af Amer 08/16/2018 86  >59 mL/min/1.73 Final  . BUN/Creatinine Ratio 08/16/2018 18  9 - 23 Final  . Sodium 08/16/2018 144  134 -  144 mmol/L Final  . Potassium 08/16/2018 4.1  3.5 - 5.2 mmol/L  Final  . Chloride 08/16/2018 106  96 - 106 mmol/L Final  . Calcium 08/16/2018 9.8  8.7 - 10.2 mg/dL Final  . Total Protein 08/16/2018 6.8  6.0 - 8.5 g/dL Final  . Albumin 08/16/2018 4.4  3.5 - 5.5 g/dL Final   Comment:     **Effective August 20, 2018 Albumin reference**       interval will be changing to:              Age                Female          Female           0 -  7 days        3.6 - 4.9      3.6 - 4.9           8 - 30 days        3.4 - 4.7      3.4 - 4.7           1 -  6 month       3.7 - 4.8      3.7 - 4.8    7 months -  2 years       3.9 - 5.0      3.9 - 5.0           3 -  5 years       4.0 - 5.0      4.0 - 5.0           6 - 12 years       4.1 - 5.0      4.0 - 5.0          13 - 30 years       4.1 - 5.2      3.9 - 5.0          31 - 50 years       4.0 - 5.0      3.8 - 4.8          51 - 60 years       3.8 - 4.9      3.8 - 4.9          61 - 70 years       3.8 - 4.8      3.8 - 4.8          71 - 80 years       3.7 - 4.7      3.7 - 4.7          81 - 89 years       3.6 - 4.6      3.6 - 4.6              >89 years       3.5 - 4.6      3.5 - 4.6   . Globulin, Total 08/16/2018 2.4  1.5 - 4.5 g/dL Final  . Albumin/Globulin Ratio 08/16/2018 1.8  1.2 - 2.2 Final  . Bilirubin Total 08/16/2018 0.8  0.0 - 1.2 mg/dL Final  . Alkaline Phosphatase 08/16/2018 64  39 - 117 IU/L Final  . AST 08/16/2018 18  0 - 40 IU/L Final  . Magnesium 08/16/2018 2.1  1.6 - 2.3 mg/dL Final  . Vitamin B-12 08/16/2018 932  232 -  1,245 pg/mL Final  . Sed Rate 08/16/2018 37* 0 - 32 mm/hr Final  . 25-Hydroxy, Vitamin D 08/16/2018 67  ng/mL Final   Comment: Reference Range: All Ages: Target levels 30 - 100   . 25-Hydroxy, Vitamin D-2 08/16/2018 <1.0  ng/mL Final  . 25-Hydroxy, Vitamin D-3 08/16/2018 67  ng/mL Final  . CRP 08/16/2018 2  0 - 10 mg/L Final   Assessment  The primary encounter diagnosis was Chronic pain syndrome. Diagnoses of Chronic low back pain (Primary Area of Pain) (Bilateral) (R>L) w/o  sciatica, Chronic sacroiliac joint pain (Secondary Area of Pain) (Right), Lumbar facet syndrome (Bilateral), Lumbar facet hypertrophy (Multilevel) (Bilateral), L5-S1 pars defect w/ spondylolisthesis (Bilateral), Chronic musculoskeletal pain, Osteoarthritis of sacroiliac joint (Bilateral), Osteoarthritis of facet joint of lumbar spine, and Lumbar spondylosis were also pertinent to this visit.  Plan of Care  I have changed Consuela Strand's HYDROcodone-acetaminophen. I am also having her maintain her fludrocortisone, Vitamin D3, pseudoephedrine, fexofenadine, benzonatate, baclofen, and meloxicam.  Pharmacotherapy (Medications Ordered): Meds ordered this encounter  Medications  . baclofen (LIORESAL) 10 MG tablet    Sig: Take 1 tablet (10 mg total) by mouth daily for 30 days.    Dispense:  30 tablet    Refill:  0    Do not place medication on "Automatic Refill". Fill one day early if pharmacy is closed on scheduled refill date.  Marland Kitchen HYDROcodone-acetaminophen (NORCO) 7.5-325 MG tablet    Sig: Take 1 tablet by mouth every 8 (eight) hours as needed for up to 30 days for severe pain. Must last 30 days.    Dispense:  90 tablet    Refill:  0    [place] STOP ACT - Not applicable to Chronic Pain Syndrome (G89.4) diagnosis. Fill one day early if pharmacy is closed on scheduled refill date. Do not fill until: 11/16/18. To last until: 12/16/18.  Marland Kitchen meloxicam (MOBIC) 15 MG tablet    Sig: Take 1 tablet (15 mg total) by mouth daily for 30 days.    Dispense:  30 tablet    Refill:  0    Do not add this medication to the electronic "Automatic Refill" notification system. Patient may have prescription filled one day early if pharmacy is closed on scheduled refill date.   Orders:  Orders Placed This Encounter  Procedures  . LUMBAR FACET(MEDIAL BRANCH NERVE BLOCK) MBNB    Standing Status:   Future    Standing Expiration Date:   12/14/2018    Scheduling Instructions:     Side: Bilateral     Level: L3-4, L4-5, & L5-S1  Facets (L2, L3, L4, L5, & S1 Medial Branch Nerves)     Sedation: Patient's choice.     Timeframe: ASAA    Order Specific Question:   Where will this procedure be performed?    Answer:   [facility]   Follow-up plan:   Return for Procedure (w/ sedation): (B) L-FCT BLK #1.   Interventional management options: Planned, scheduled, and/or pending:    Our goal will be to move away from the opioid analgesics.  Medication agreement violation uncovered on [employer].  Final warning given to the patient and letter sent to her PCP. Diagnostic bilateral lumbar facet block #1 under fluoroscopic guidance and IV sedation.   Considering:   Diagnostic bilateral lumbar facet nerve block  Possible bilateral lumbar facet RFA  Diagnostic bilateral sacroiliac joint injection  Possible bilateral sacroiliac joint RFA  Diagnostic bilateral L5 transforaminal LESI  Diagnostic left-sided L2 transforaminal LESI    PRN Procedures:   None   I discussed the assessment and treatment plan with the patient. The patient was provided an opportunity to ask questions and all were answered. The patient agreed with the plan and demonstrated an understanding of the instructions.  Patient advised to call back or seek an in-person evaluation if the symptoms or condition worsens.  Total duration of non-face-to-face encounter: 12 minutes.  Note by: [name], MD Date: 11/14/2018; Time: 9:23 AM  Disclaimer:  * Given the special circumstances of the COVID-19 pandemic, the federal government has announced that the Office for Civil Rights (OCR) will exercise its enforcement discretion and will not impose penalties on physicians using telehealth in the event of noncompliance with regulatory requirements under the [place] and Accountability Act (HIPAA) in connection with the good faith provision of telehealth during the QBVQX-45 national public  health emergency. ([place])

## 2018-11-14 NOTE — Patient Instructions (Addendum)
____________________________________________________________________________________________  Preparing for Procedure with Sedation  Procedure appointments are limited to planned procedures: . No Prescription Refills. . No disability issues will be discussed. . No medication changes will be discussed.  Instructions: . Oral Intake: Do not eat or drink anything for at least 8 hours prior to your procedure. . Transportation: Public transportation is not allowed. Bring an adult driver. The driver must be physically present in our waiting room before any procedure can be started. . Physical Assistance: Bring an adult physically capable of assisting you, in the event you need help. This adult should keep you company at home for at least 6 hours after the procedure. . Blood Pressure Medicine: Take your blood pressure medicine with a sip of water the morning of the procedure. . Blood thinners: Notify our staff if you are taking any blood thinners. Depending on which one you take, there will be specific instructions on how and when to stop it. . Diabetics on insulin: Notify the staff so that you can be scheduled 1st case in the morning. If your diabetes requires high dose insulin, take only  of your normal insulin dose the morning of the procedure and notify the staff that you have done so. . Preventing infections: Shower with an antibacterial soap the morning of your procedure. . Build-up your immune system: Take 1000 mg of Vitamin C with every meal (3 times a day) the day prior to your procedure. . Antibiotics: Inform the staff if you have a condition or reason that requires you to take antibiotics before dental procedures. . Pregnancy: If you are pregnant, call and cancel the procedure. . Sickness: If you have a cold, fever, or any active infections, call and cancel the procedure. . Arrival: You must be in the facility at least 30 minutes prior to your scheduled procedure. . Children: Do not bring  children with you. . Dress appropriately: Bring dark clothing that you would not mind if they get stained. . Valuables: Do not bring any jewelry or valuables.  Reasons to call and reschedule or cancel your procedure: (Following these recommendations will minimize the risk of a serious complication.) . Surgeries: Avoid having procedures within 2 weeks of any surgery. (Avoid for 2 weeks before or after any surgery). . Flu Shots: Avoid having procedures within 2 weeks of a flu shots or . (Avoid for 2 weeks before or after immunizations). . Barium: Avoid having a procedure within 7-10 days after having had a radiological study involving the use of radiological contrast. (Myelograms, Barium swallow or enema study). . Heart attacks: Avoid any elective procedures or surgeries for the initial 6 months after a "Myocardial Infarction" (Heart Attack). . Blood thinners: It is imperative that you stop these medications before procedures. Let us know if you if you take any blood thinner.  . Infection: Avoid procedures during or within two weeks of an infection (including chest colds or gastrointestinal problems). Symptoms associated with infections include: Localized redness, fever, chills, night sweats or profuse sweating, burning sensation when voiding, cough, congestion, stuffiness, runny nose, sore throat, diarrhea, nausea, vomiting, cold or Flu symptoms, recent or current infections. It is specially important if the infection is over the area that we intend to treat. . Heart and lung problems: Symptoms that may suggest an active cardiopulmonary problem include: cough, chest pain, breathing difficulties or shortness of breath, dizziness, ankle swelling, uncontrolled high or unusually low blood pressure, and/or palpitations. If you are experiencing any of these symptoms, cancel your procedure and contact   your primary care physician for an evaluation.  Remember:  Regular Business hours are:  Monday to Thursday  8:00 AM to 4:00 PM  Provider's Schedule: Shermika Balthaser, MD:  Procedure days: Tuesday and Thursday 7:30 AM to 4:00 PM  Bilal Lateef, MD:  Procedure days: Monday and Wednesday 7:30 AM to 4:00 PM ____________________________________________________________________________________________   ____________________________________________________________________________________________  Medication Rules  Purpose: To inform patients, and their family members, of our rules and regulations.  Applies to: All patients receiving prescriptions (written or electronic).  Pharmacy of record: Pharmacy where electronic prescriptions will be sent. If written prescriptions are taken to a different pharmacy, please inform the nursing staff. The pharmacy listed in the electronic medical record should be the one where you would like electronic prescriptions to be sent.  Electronic prescriptions: In compliance with the [place] Strengthen Opioid Misuse Prevention (STOP) Act of 2017 (Session Law 2017-74/H243), effective August 01, 2018, all controlled substances must be electronically prescribed. Calling prescriptions to the pharmacy will cease to exist.  Prescription refills: Only during scheduled appointments. Applies to all prescriptions.  NOTE: The following applies primarily to controlled substances (Opioid* Pain Medications).   Patient's responsibilities: 1. Pain Pills: Bring all pain pills to every appointment (except for procedure appointments). 2. Pill Bottles: Bring pills in original pharmacy bottle. Always bring the newest bottle. Bring bottle, even if empty. 3. Medication refills: You are responsible for knowing and keeping track of what medications you take and those you need refilled. The day before your appointment: write a list of all prescriptions that need to be refilled. The day of the appointment: give the list to the admitting nurse. Prescriptions will be written only during  appointments. No prescriptions will be written on procedure days. If you forget a medication: it will not be "Called in", "Faxed", or "electronically sent". You will need to get another appointment to get these prescribed. No early refills. Do not call asking to have your prescription filled early. 4. Prescription Accuracy: You are responsible for carefully inspecting your prescriptions before leaving our office. Have the discharge nurse carefully go over each prescription with you, before taking them home. Make sure that your name is accurately spelled, that your address is correct. Check the name and dose of your medication to make sure it is accurate. Check the number of pills, and the written instructions to make sure they are clear and accurate. Make sure that you are given enough medication to last until your next medication refill appointment. 5. Taking Medication: Take medication as prescribed. When it comes to controlled substances, taking less pills or less frequently than prescribed is permitted and encouraged. Never take more pills than instructed. Never take medication more frequently than prescribed.  6. Inform other Doctors: Always inform, all of your healthcare providers, of all the medications you take. 7. Pain Medication from other Providers: You are not allowed to accept any additional pain medication from any other Doctor or Healthcare provider. There are two exceptions to this rule. (see below) In the event that you require additional pain medication, you are responsible for notifying us, as stated below. 8. Medication Agreement: You are responsible for carefully reading and following our Medication Agreement. This must be signed before receiving any prescriptions from our practice. Safely store a copy of your signed Agreement. Violations to the Agreement will result in no further prescriptions. (Additional copies of our Medication Agreement are available upon request.) 9. Laws, Rules,  & Regulations: All patients are expected to follow all Federal   and State Laws, Statutes, Rules, & Regulations. Ignorance of the Laws does not constitute a valid excuse. The use of any illegal substances is prohibited. 10. Adopted CDC guidelines & recommendations: Target dosing levels will be at or below 60 MME/day. Use of benzodiazepines** is not recommended.  Exceptions: There are only two exceptions to the rule of not receiving pain medications from other Healthcare Providers. 1. Exception #1 (Emergencies): In the event of an emergency (i.e.: accident requiring emergency care), you are allowed to receive additional pain medication. However, you are responsible for: As soon as you are able, call our office [phone], at any time of the day or night, and leave a message stating your name, the date and nature of the emergency, and the name and dose of the medication prescribed. In the event that your call is answered by a member of our staff, make sure to document and save the date, time, and the name of the person that took your information.  2. Exception #2 (Planned Surgery): In the event that you are scheduled by another doctor or dentist to have any type of surgery or procedure, you are allowed (for a period no longer than 30 days), to receive additional pain medication, for the acute post-op pain. However, in this case, you are responsible for picking up a copy of our "Post-op Pain Management for Surgeons" handout, and giving it to your surgeon or dentist. This document is available at our office, and does not require an appointment to obtain it. Simply go to our office during business hours (Monday-Thursday from 8:00 AM to 4:00 PM) (Friday 8:00 AM to 12:00 Noon) or if you have a scheduled appointment with us, prior to your surgery, and ask for it by name. In addition, you will need to provide us with your name, name of your surgeon, type of surgery, and date of procedure or surgery.  *Opioid  medications include: morphine, codeine, oxycodone, oxymorphone, hydrocodone, hydromorphone, meperidine, tramadol, tapentadol, buprenorphine, fentanyl, methadone. **Benzodiazepine medications include: diazepam (Valium), alprazolam (Xanax), clonazepam (Klonopine), lorazepam (Ativan), clorazepate (Tranxene), chlordiazepoxide (Librium), estazolam (Prosom), oxazepam (Serax), temazepam (Restoril), triazolam (Halcion) (Last updated: 09/28/2017) ____________________________________________________________________________________________   ____________________________________________________________________________________________  Medication Recommendations and Reminders  Applies to: All patients receiving prescriptions (written and/or electronic).  Medication Rules & Regulations: These rules and regulations exist for your safety and that of others. They are not flexible and neither are we. Dismissing or ignoring them will be considered "non-compliance" with medication therapy, resulting in complete and irreversible termination of such therapy. (See document titled "Medication Rules" for more details.) In all conscience, because of safety reasons, we cannot continue providing a therapy where the patient does not follow instructions.  Pharmacy of record:   Definition: This is the pharmacy where your electronic prescriptions will be sent.   We do not endorse any particular pharmacy.  You are not restricted in your choice of pharmacy.  The pharmacy listed in the electronic medical record should be the one where you want electronic prescriptions to be sent.  If you choose to change pharmacy, simply notify our nursing staff of your choice of new pharmacy.  Recommendations:  Keep all of your pain medications in a safe place, under lock and key, even if you live alone.   After you fill your prescription, take 1 week's worth of pills and put them away in a safe place. You should keep a separate,  properly labeled bottle for this purpose. The remainder should be kept in the original bottle. Use   this as your primary supply, until it runs out. Once it's gone, then you know that you have 1 week's worth of medicine, and it is time to come in for a prescription refill. If you do this correctly, it is unlikely that you will ever run out of medicine.  To make sure that the above recommendation works, it is very important that you make sure your medication refill appointments are scheduled at least 1 week before you run out of medicine. To do this in an effective manner, make sure that you do not leave the office without scheduling your next medication management appointment. Always ask the nursing staff to show you in your prescription , when your medication will be running out. Then arrange for the receptionist to get you a return appointment, at least 7 days before you run out of medicine. Do not wait until you have 1 or 2 pills left, to come in. This is very poor planning and does not take into consideration that we may need to cancel appointments due to bad weather, sickness, or emergencies affecting our staff.  "Partial Fill": If for any reason your pharmacy does not have enough pills/tablets to completely fill or refill your prescription, do not allow for a "partial fill". You will need a separate prescription to fill the remaining amount, which we will not provide. If the reason for the partial fill is your insurance, you will need to talk to the pharmacist about payment alternatives for the remaining tablets, but again, do not accept a partial fill.  Prescription refills and/or changes in medication(s):   Prescription refills, and/or changes in dose or medication, will be conducted only during scheduled medication management appointments. (Applies to both, written and electronic prescriptions.)  No refills on procedure days. No medication will be changed or started on procedure days. No changes,  adjustments, and/or refills will be conducted on a procedure day. Doing so will interfere with the diagnostic portion of the procedure.  No phone refills. No medications will be "called into the pharmacy".  No Fax refills.  No weekend refills.  No Holliday refills.  No after hours refills.  Remember:  Business hours are:  Monday to Thursday 8:00 AM to 4:00 PM Provider's Schedule: Crystal King, NP - Appointments are:  Medication management: Monday to Thursday 8:00 AM to 4:00 PM Keeara Frees, MD - Appointments are:  Medication management: Monday and Wednesday 8:00 AM to 4:00 PM Procedure day: Tuesday and Thursday 7:30 AM to 4:00 PM Bilal Lateef, MD - Appointments are:  Medication management: Tuesday and Thursday 8:00 AM to 4:00 PM Procedure day: Monday and Wednesday 7:30 AM to 4:00 PM (Last update: 09/28/2017) ____________________________________________________________________________________________   ____________________________________________________________________________________________  CANNABIDIOL (AKA: CBD Oil or Pills)  Applies to: All patients receiving prescriptions of controlled substances (written and/or electronic).  General Information: Cannabidiol (CBD) was discovered in 1940. It is one of some 113 identified cannabinoids in cannabis (Marijuana) plants, accounting for up to 40% of the plant's extract. As of 2018, preliminary clinical research on cannabidiol included studies of anxiety, cognition, movement disorders, and pain.  Cannabidiol is consummed in multiple ways, including inhalation of cannabis smoke or vapor, as an aerosol spray into the cheek, and by mouth. It may be supplied as CBD oil containing CBD as the active ingredient (no added tetrahydrocannabinol (THC) or terpenes), a full-plant CBD-dominant hemp extract oil, capsules, dried cannabis, or as a liquid solution. CBD is thought not have the same psychoactivity as THC, and may affect the   actions  of THC. Studies suggest that CBD may interact with different biological targets, including cannabinoid receptors and other neurotransmitter receptors. As of 2018 the mechanism of action for its biological effects has not been determined.  In the [place], cannabidiol has a limited approval by the Food and Drug Administration (FDA) for treatment of only two types of epilepsy disorders. The side effects of long-term use of the drug include somnolence, decreased appetite, diarrhea, fatigue, malaise, weakness, sleeping problems, and others.  CBD remains a Schedule I drug prohibited for any use.  Legality: Some manufacturers ship CBD products nationally, an illegal action which the FDA has not enforced in 2018, with CBD remaining the subject of an FDA investigational new drug evaluation, and is not considered legal as a dietary supplement or food ingredient as of December 2018. Federal illegality has made it difficult historically to conduct research on CBD. CBD is openly sold in head shops and health food stores in some states where such sales have not been explicitly legalized.  Warning: Because it is not FDA approved for general use or treatment of pain, it is not required to undergo the same manufacturing controls as prescription drugs.  This means that the available cannabidiol (CBD) may be contaminated with THC.  If this is the case, it will trigger a positive urine drug screen (UDS) test for cannabinoids (Marijuana).  Because a positive UDS for illicit substances is a violation of our medication agreement, your opioid analgesics (pain medicine) may be permanently discontinued. (Last update: 10/19/2017) ____________________________________________________________________________________________    

## 2018-11-15 MED FILL — BACLOFEN 10 MG TABS: 10 | 30 days supply | Qty: 30 | Fill #0

## 2018-11-15 MED FILL — MELOXICAM 15 MG TABLET: 15 | 30 days supply | Qty: 30 | Fill #0

## 2018-11-15 NOTE — Telephone Encounter (Signed)
No answer. Left voicemail for her to return call.

## 2018-11-15 NOTE — Telephone Encounter (Signed)
Patient needs her Baclofen and Mobic Escribed to the [facility] instead of [facility] due to insurance. [facility] called and scripts cancelled . Called into [employer] This transaction was done without contacting Dr. Dossie Arbour but cleared with Jocelyn Lamer prior to completing.

## 2018-11-20 ENCOUNTER — Encounter: Payer: Self-pay | Admitting: Pain Medicine

## 2018-11-21 ENCOUNTER — Encounter: Payer: Self-pay | Admitting: Family Medicine

## 2018-12-12 ENCOUNTER — Ambulatory Visit: Payer: No Typology Code available for payment source | Attending: Nurse Practitioner | Admitting: Nurse Practitioner

## 2018-12-12 ENCOUNTER — Other Ambulatory Visit: Payer: Self-pay

## 2018-12-12 DIAGNOSIS — M47818 Spondylosis without myelopathy or radiculopathy, sacral and sacrococcygeal region: Secondary | ICD-10-CM

## 2018-12-12 DIAGNOSIS — M533 Sacrococcygeal disorders, not elsewhere classified: Secondary | ICD-10-CM | POA: Diagnosis not present

## 2018-12-12 DIAGNOSIS — M47816 Spondylosis without myelopathy or radiculopathy, lumbar region: Secondary | ICD-10-CM

## 2018-12-12 DIAGNOSIS — M461 Sacroiliitis, not elsewhere classified: Secondary | ICD-10-CM

## 2018-12-12 DIAGNOSIS — M7918 Myalgia, other site: Secondary | ICD-10-CM

## 2018-12-12 DIAGNOSIS — G8929 Other chronic pain: Secondary | ICD-10-CM

## 2018-12-12 DIAGNOSIS — G894 Chronic pain syndrome: Secondary | ICD-10-CM | POA: Diagnosis not present

## 2018-12-12 MED ORDER — MELOXICAM 15 MG PO TABS: 15.0000 mg | ORAL_TABLET | Freq: Every day | ORAL | 0 refills | Status: DC

## 2018-12-12 MED ORDER — BACLOFEN 10 MG PO TABS: 10.0000 mg | ORAL_TABLET | Freq: Every day | ORAL | 0 refills | Status: DC

## 2018-12-12 MED ORDER — HYDROCODONE-ACETAMINOPHEN 7.5-325 MG PO TABS: 1.0000 | ORAL_TABLET | Freq: Three times a day (TID) | ORAL | 0 refills | Status: DC | PRN

## 2018-12-12 MED FILL — MELOXICAM 15 MG TABLET: 15 | 30 days supply | Qty: 30 | Fill #0

## 2018-12-12 MED FILL — BACLOFEN 10 MG TABS: 10 | 30 days supply | Qty: 30 | Fill #0

## 2018-12-12 NOTE — Patient Instructions (Signed)
____________________________________________________________________________________________  Medication Rules  Purpose: To inform patients, and their family members, of our rules and regulations.  Applies to: All patients receiving prescriptions (written or electronic).  Pharmacy of record: Pharmacy where electronic prescriptions will be sent. If written prescriptions are taken to a different pharmacy, please inform the nursing staff. The pharmacy listed in the electronic medical record should be the one where you would like electronic prescriptions to be sent.  Electronic prescriptions: In compliance with the [place] Strengthen Opioid Misuse Prevention (STOP) Act of 2017 (Session Law 2017-74/H243), effective August 01, 2018, all controlled substances must be electronically prescribed. Calling prescriptions to the pharmacy will cease to exist.  Prescription refills: Only during scheduled appointments. Applies to all prescriptions.  NOTE: The following applies primarily to controlled substances (Opioid* Pain Medications).   Patient's responsibilities: 1. Pain Pills: Bring all pain pills to every appointment (except for procedure appointments). 2. Pill Bottles: Bring pills in original pharmacy bottle. Always bring the newest bottle. Bring bottle, even if empty. 3. Medication refills: You are responsible for knowing and keeping track of what medications you take and those you need refilled. The day before your appointment: write a list of all prescriptions that need to be refilled. The day of the appointment: give the list to the admitting nurse. Prescriptions will be written only during appointments. No prescriptions will be written on procedure days. If you forget a medication: it will not be "Called in", "Faxed", or "electronically sent". You will need to get another appointment to get these prescribed. No early refills. Do not call asking to have your prescription filled  early. 4. Prescription Accuracy: You are responsible for carefully inspecting your prescriptions before leaving our office. Have the discharge nurse carefully go over each prescription with you, before taking them home. Make sure that your name is accurately spelled, that your address is correct. Check the name and dose of your medication to make sure it is accurate. Check the number of pills, and the written instructions to make sure they are clear and accurate. Make sure that you are given enough medication to last until your next medication refill appointment. 5. Taking Medication: Take medication as prescribed. When it comes to controlled substances, taking less pills or less frequently than prescribed is permitted and encouraged. Never take more pills than instructed. Never take medication more frequently than prescribed.  6. Inform other Doctors: Always inform, all of your healthcare providers, of all the medications you take. 7. Pain Medication from other Providers: You are not allowed to accept any additional pain medication from any other Doctor or Healthcare provider. There are two exceptions to this rule. (see below) In the event that you require additional pain medication, you are responsible for notifying us, as stated below. 8. Medication Agreement: You are responsible for carefully reading and following our Medication Agreement. This must be signed before receiving any prescriptions from our practice. Safely store a copy of your signed Agreement. Violations to the Agreement will result in no further prescriptions. (Additional copies of our Medication Agreement are available upon request.) 9. Laws, Rules, & Regulations: All patients are expected to follow all Federal and State Laws, Statutes, Rules, & Regulations. Ignorance of the Laws does not constitute a valid excuse. The use of any illegal substances is prohibited. 10. Adopted CDC guidelines & recommendations: Target dosing levels will be  at or below 60 MME/day. Use of benzodiazepines** is not recommended.  Exceptions: There are only two exceptions to the rule of not   receiving pain medications from other Healthcare Providers. 1. Exception #1 (Emergencies): In the event of an emergency (i.e.: accident requiring emergency care), you are allowed to receive additional pain medication. However, you are responsible for: As soon as you are able, call our office [phone], at any time of the day or night, and leave a message stating your name, the date and nature of the emergency, and the name and dose of the medication prescribed. In the event that your call is answered by a member of our staff, make sure to document and save the date, time, and the name of the person that took your information.  2. Exception #2 (Planned Surgery): In the event that you are scheduled by another doctor or dentist to have any type of surgery or procedure, you are allowed (for a period no longer than 30 days), to receive additional pain medication, for the acute post-op pain. However, in this case, you are responsible for picking up a copy of our "Post-op Pain Management for Surgeons" handout, and giving it to your surgeon or dentist. This document is available at our office, and does not require an appointment to obtain it. Simply go to our office during business hours (Monday-Thursday from 8:00 AM to 4:00 PM) (Friday 8:00 AM to 12:00 Noon) or if you have a scheduled appointment with us, prior to your surgery, and ask for it by name. In addition, you will need to provide us with your name, name of your surgeon, type of surgery, and date of procedure or surgery.  *Opioid medications include: morphine, codeine, oxycodone, oxymorphone, hydrocodone, hydromorphone, meperidine, tramadol, tapentadol, buprenorphine, fentanyl, methadone. **Benzodiazepine medications include: diazepam (Valium), alprazolam (Xanax), clonazepam (Klonopine), lorazepam (Ativan), clorazepate  (Tranxene), chlordiazepoxide (Librium), estazolam (Prosom), oxazepam (Serax), temazepam (Restoril), triazolam (Halcion) (Last updated: 09/28/2017) ____________________________________________________________________________________________    

## 2018-12-12 NOTE — Progress Notes (Signed)
Pain Management Encounter Note - Virtual Visit via Telephone Telehealth (real-time audio visits between healthcare provider and patient).  Patient's Phone No. & Preferred Pharmacy:  [phone] (home); [phone] (mobile); (Preferred) [phone]  [place], [place] [address] Phone: [phone] Fax: [phone]  [facility] [place], [address] [place] [address] Phone: [phone] Fax: [phone]   Pre-screening note:  Our staff contacted Denise Burns and offered her an "in person", "face-to-face" appointment versus a telephone encounter. She indicated preferring the telephone encounter, at this time.  Reason for Virtual Visit: COVID-19*  Social distancing based on CDC and AMA recommendations.   I contacted Denise Burns on 12/12/2018 at 9:10 AM by telephone and clearly identified myself as Dionisio David, NP. I verified that I was speaking with the correct person using two identifiers (Name and date of birth: 07-Aug-1970).  Advanced Informed Consent I sought verbal advanced consent from Denise Burns for telemedicine interactions and virtual visit. I informed Denise Burns of the security and privacy concerns, risks, and limitations associated with performing an evaluation and management service by telephone. I also informed Denise Burns of the availability of "in person" appointments and I informed her of the possibility of a patient responsible charge related to this service. Denise Burns expressed understanding and agreed to proceed.   Historic Elements   Denise Burns is a 48 y.o. year old, female patient evaluated today after her last encounter by our practice on 11/14/2018. Denise Burns  has a past medical history of Addison disease ([place]), Arthritis, Back pain, and GERD (gastroesophageal reflux  disease). She also  has a past surgical history that includes Cholecystectomy and Polypectomy. Denise Burns has a current medication list which includes the following prescription(s): baclofen, benzonatate, vitamin d3, fexofenadine, fludrocortisone, hydrocodone-acetaminophen, meloxicam, and pseudoephedrine. She  reports that she has quit smoking. She has never used smokeless tobacco. She reports current alcohol use. She reports that she does not use drugs. Denise Burns is allergic to lisinopril; ultram [tramadol]; and sulfa antibiotics.   HPI  I last saw her on 10/17/2018. She is being evaluated for medication management. She is having 3/10 middle lower back pain that goes into her hips.She denies any numbness, tingling or weakness in her back or legs. She feels like the pain is getting worse. She has been moving and that she is doing more. She denies any new concerns today. She denies any side effects of her medications. She does want to know when procedures will start back.    Pharmacotherapy Assessment  Analgesic:Hydrocodone/APAP 7.5/325 1 tablet p.o. 3 times daily (22.5 mg/day of hydrocodone) MME/day:22.5mg /day  Monitoring: Pharmacotherapy: No side-effects or adverse reactions reported. [place] PMP: PDMP reviewed during this encounter.       Compliance: No problems identified. Plan: Refer to "POC".  Review of recent tests  DG Si Joints CLINICAL DATA:  Chronic right sacroiliac joint pain.  EXAM: BILATERAL SACROILIAC JOINTS - 3+ VIEW  COMPARISON:  MRI lumbar spine 03/22/2018  FINDINGS: Sacroiliac joint spaces are maintained bilaterally without osteolysis or ankylosis. Mild degenerative changes along the caudal aspect of the SI joints. Probable facet arthropathy near the lumbosacral junction but incompletely characterized on this examination.  IMPRESSION: Symmetric appearance of the SI joints without significant arthropathy.  Electronically Signed   By: Markus Daft M.D.   On:  08/16/2018 16:22  Office Visit on 08/16/2018  Component Date Value Ref Range Status  . Summary 08/16/2018 FINAL   Final   Comment: ==================================================================== TOXASSURE COMP DRUG ANALYSIS,UR ==================================================================== Test                             Result       Flag       Units Drug Present and Declared for Prescription Verification   Hydrocodone                    205          EXPECTED   ng/mg creat   Hydromorphone                  671          EXPECTED   ng/mg creat   Dihydrocodeine                 153          EXPECTED   ng/mg creat   Norhydrocodone                 642          EXPECTED   ng/mg creat    Sources of hydrocodone include scheduled prescription    medications. Hydromorphone, dihydrocodeine and norhydrocodone are    expected metabolites of hydrocodone. Hydromorphone and    dihydrocodeine are also available as scheduled prescription    medications.   Ephedrine/Pseudoephedrine      PRESENT      EXPECTED   Phenylpropanolamine            PRESENT      EXPECTED    Source of ephedrine/pseudoephedrine is most                           commonly    pseudoephedrine in over-the-counter or prescription cold and    allergy medications. Phenylpropanolamine is an expected    metabolite of ephedrine/pseudoephedrine.   Baclofen                       PRESENT      EXPECTED   Acetaminophen                  PRESENT      EXPECTED Drug Present not Declared for Prescription Verification   Ibuprofen                      PRESENT      UNEXPECTED   Doxylamine                     PRESENT      UNEXPECTED   Dextrorphan/Levorphanol        PRESENT      UNEXPECTED    Dextrorphan is an expected metabolite of dextromethorphan, an    over-the-counter or prescription cough suppressant. Levorphanol    is a scheduled prescription medication. Dextrorphan cannot be    distinguished from levorphanol by the method used for  analysis. ==================================================================== Test                      Result    Flag   Units      Ref Range   Creatinine              234  mg/dL      >=20 =============                          ======================================================= Declared Medications:  The flagging and interpretation on this report are based on the  following declared medications.  Unexpected results may arise from  inaccuracies in the declared medications.  **Note: The testing scope of this panel includes these medications:  Baclofen  Hydrocodone (Hydrocodone-Acetaminophen)  Pseudoephedrine (Sudafed)  **Note: The testing scope of this panel does not include small to  moderate amounts of these reported medications:  Acetaminophen (Hydrocodone-Acetaminophen)  **Note: The testing scope of this panel does not include following  reported medications:  Cholecalciferol  Fexofenadine  Fludrocortisone (Florinef)  Hydrocortisone (Cortef) ==================================================================== For clinical consultation, please call [phone]. ====================================================================   . Glucose 08/16/2018 97  65 - 99 mg/dL Final  . BUN 08/16/2018 17  6 - 24 mg/dL Final  . Creatinine, Ser 08/16/2018 0.92  0.57 - 1.00 mg/dL Final  . GFR calc non Af Amer 08/16/2018 74  >59 mL/min/1.73 Final  . GFR calc Af Amer 08/16/2018 86  >59 mL/min/1.73 Final  . BUN/Creatinine Ratio 08/16/2018 18  9 - 23 Final  . Sodium 08/16/2018 144  134 - 144 mmol/L Final  . Potassium 08/16/2018 4.1  3.5 - 5.2 mmol/L Final  . Chloride 08/16/2018 106  96 - 106 mmol/L Final  . Calcium 08/16/2018 9.8  8.7 - 10.2 mg/dL Final  . Total Protein 08/16/2018 6.8  6.0 - 8.5 g/dL Final  . Albumin 08/16/2018 4.4  3.5 - 5.5 g/dL Final   Comment:     **Effective August 20, 2018 Albumin reference**       interval will be changing to:              Age                 Female          Female           0 -  7 days        3.6 - 4.9      3.6 - 4.9           8 - 30 days        3.4 - 4.7      3.4 - 4.7           1 -  6 month       3.7 - 4.8      3.7 - 4.8    7 months -  2 years       3.9 - 5.0      3.9 - 5.0           3 -  5 years       4.0 - 5.0      4.0 - 5.0           6 - 12 years       4.1 - 5.0      4.0 - 5.0          13 - 30 years       4.1 - 5.2      3.9 - 5.0          31 - 50 years       4.0 - 5.0      3.8 - 4.8          51 -  60 years       3.8 - 4.9      3.8 - 4.9          61 - 70 years       3.8 - 4.8      3.8 - 4.8          71 - 80 years       3.7 - 4.7      3.7 - 4.7          81 - 89 years       3.6 - 4.6      3.6 - 4.6              >89 years       3.5 - 4.6      3.5 - 4.6   . Globulin, Total 08/16/2018 2.4  1.5 - 4.5 g/dL Final  . Albumin/Globulin Ratio 08/16/2018 1.8  1.2 - 2.2 Final  . Bilirubin Total 08/16/2018 0.8  0.0 - 1.2 mg/dL Final  . Alkaline Phosphatase 08/16/2018 64  39 - 117 IU/L Final  . AST 08/16/2018 18  0 - 40 IU/L Final  . Magnesium 08/16/2018 2.1  1.6 - 2.3 mg/dL Final  . Vitamin B-12 08/16/2018 932  232 - 1,245 pg/mL Final  . Sed Rate 08/16/2018 37* 0 - 32 mm/hr Final  . 25-Hydroxy, Vitamin D 08/16/2018 67  ng/mL Final   Comment: Reference Range: All Ages: Target levels 30 - 100   . 25-Hydroxy, Vitamin D-2 08/16/2018 <1.0  ng/mL Final  . 25-Hydroxy, Vitamin D-3 08/16/2018 67  ng/mL Final  . CRP 08/16/2018 2  0 - 10 mg/L Final   Assessment  The primary encounter diagnosis was Chronic sacroiliac joint pain (Secondary Area of Pain) (Right). Diagnoses of Chronic musculoskeletal pain, Chronic pain syndrome, Osteoarthritis of sacroiliac joint (Bilateral), and Osteoarthritis of facet joint of lumbar spine were also pertinent to this visit.  Plan of Care  I am having Denise Burns maintain her fludrocortisone, Vitamin D3, pseudoephedrine, fexofenadine, benzonatate, baclofen, HYDROcodone-acetaminophen, and  meloxicam.  Pharmacotherapy (Medications Ordered): Meds ordered this encounter  Medications  . baclofen (LIORESAL) 10 MG tablet    Sig: Take 1 tablet (10 mg total) by mouth daily for 30 days.    Dispense:  30 tablet    Refill:  0    Do not place medication on "Automatic Refill". Fill one day early if pharmacy is closed on scheduled refill date.    Order Specific Question:   Supervising Provider    Answer:   Milinda Pointer [phone]  . HYDROcodone-acetaminophen (NORCO) 7.5-325 MG tablet    Sig: Take 1 tablet by mouth every 8 (eight) hours as needed for up to 30 days for severe pain. Must last 30 days.    Dispense:  90 tablet    Refill:  0    [place] STOP ACT - Not applicable to Chronic Pain Syndrome (G89.4) diagnosis. Fill one day early if pharmacy is closed on scheduled refill date. Do not fill until: 11/16/18. To last until: 12/16/18.    Order Specific Question:   Supervising Provider    Answer:   Milinda Pointer [phone]  . meloxicam (MOBIC) 15 MG tablet    Sig: Take 1 tablet (15 mg total) by mouth daily for 30 days.    Dispense:  30 tablet    Refill:  0    Do not add this medication to the electronic "Automatic Refill" notification system. Patient may have prescription  filled one day early if pharmacy is closed on scheduled refill date.    Order Specific Question:   Supervising Provider    Answer:   Milinda Pointer [phone]   Orders:  No orders of the defined types were placed in this encounter.  Follow-up plan:   Return for Procedure (w/ sedation): (B) L-FCT BLK #1.   Interventional management options: Planned, scheduled, and/or pending: Diagnostic bilateral lumbar facet block#1under fluoroscopic guidance and IV sedation.   Considering: Diagnostic bilateral lumbar facet nerve block Possible bilateral lumbar facetRFA Diagnosticbilateralsacroiliac joint injection Possiblebilateralsacroiliac joint RFA Diagnostic bilateral L5 transforaminal  LESI Diagnostic left-sided L2 transforaminal LESI   PRN Procedures: None     I discussed the assessment and treatment plan with the patient. The patient was provided an opportunity to ask questions and all were answered. The patient agreed with the plan and demonstrated an understanding of the instructions.  Patient advised to call back or seek an in-person evaluation if the symptoms or condition worsens.  Total duration of non-face-to-face encounter: 11 minutes.  Note by: Dionisio David, NP Date: 12/12/2018; Time: 9:10 AM  Disclaimer:  * Given the special circumstances of the COVID-19 pandemic, the federal government has announced that the Office for Civil Rights (OCR) will exercise its enforcement discretion and will not impose penalties on physicians using telehealth in the event of noncompliance with regulatory requirements under the [place] and [place] (HIPAA) in connection with the good faith provision of telehealth during the OBSJG-28 national public health emergency. ([place])

## 2018-12-18 MED FILL — HYDROCODON-APAP 7.5-325: 7.5-325 | 30 days supply | Qty: 90 | Fill #0

## 2019-01-04 ENCOUNTER — Telehealth: Payer: Self-pay | Admitting: *Deleted

## 2019-01-04 ENCOUNTER — Encounter: Payer: Self-pay | Admitting: Pain Medicine

## 2019-01-04 NOTE — Telephone Encounter (Signed)
Attempted to call for pre appointment assessment. Message left. 

## 2019-01-06 NOTE — Progress Notes (Signed)
Pain Management Virtual Encounter Note - Virtual Visit via Telephone Telehealth (real-time audio visits between healthcare provider and patient).   Patient's Phone No. & Preferred Pharmacy:  [phone] (home); [phone] (mobile); (Preferred) [phone] [email or web address]  [place], [place] [address] Phone: [phone] Fax: [phone]    Pre-screening note:  Our staff contacted Denise Burns and offered her an "in person", "face-to-face" appointment versus a telephone encounter. She indicated preferring the telephone encounter, at this time.   Reason for Virtual Visit: COVID-19*  Social distancing based on CDC and AMA recommendations.   I contacted Denise Burns on 01/07/2019 at 12:47 PM via telephone.      I clearly identified myself as [name], MD. I verified that I was speaking with the correct person using two identifiers (Name: Denise Burns, and date of birth: Apr 18, 1971).  Advanced Informed Consent I sought verbal advanced consent from Denise Burns for virtual visit interactions. I informed Denise Burns of possible security and privacy concerns, risks, and limitations associated with providing "not-in-person" medical evaluation and management services. I also informed Denise Burns of the availability of "in-person" appointments. Finally, I informed her that there would be a charge for the virtual visit and that she could be  personally, fully or partially, financially responsible for it. Denise Burns expressed understanding and agreed to proceed.   Historic Elements   Denise Burns is a 48 y.o. year old, female patient evaluated today after her last encounter by our practice on 01/04/2019. Denise Burns  has a past medical history of Addison disease ([place]), Arthritis, Back pain, and GERD (gastroesophageal reflux disease). She also  has a past surgical history that includes  Cholecystectomy and Polypectomy. Denise Burns has a current medication list which includes the following prescription(s): baclofen, vitamin d3, fexofenadine, fludrocortisone, hydrocodone-acetaminophen, meloxicam, and pseudoephedrine. She  reports that she has quit smoking. She has never used smokeless tobacco. She reports current alcohol use. She reports that she does not use drugs. Denise Burns is allergic to lisinopril; ultram [tramadol]; and sulfa antibiotics.   HPI  Today, she is being contacted for medication management.  Pharmacotherapy Assessment  Analgesic: Hydrocodone/APAP 7.5/325 1 tablet p.o. 3 times daily (22.5 mg/day of hydrocodone) MME/day:22.5mg /day  Monitoring: Pharmacotherapy: No side-effects or adverse reactions reported. [place] PMP: PDMP reviewed during this encounter.       Compliance: No problems identified. Effectiveness: Clinically acceptable. Plan: Refer to "POC".  Pertinent Labs   SAFETY SCREENING Profile No results found for: SARSCOV2NAA, COVIDSOURCE, STAPHAUREUS, MRSAPCR, HCVAB, HIV, PREGTESTUR Renal Function Lab Results  Component Value Date   BUN 17 08/16/2018   CREATININE 0.92 08/16/2018   BCR 18 08/16/2018   GFRAA 86 08/16/2018   GFRNONAA 74 08/16/2018   Hepatic Function Lab Results  Component Value Date   AST 18 08/16/2018   ALBUMIN 4.4 08/16/2018   UDS Summary  Date Value Ref Range Status  08/16/2018 FINAL  Final    Comment:    ==================================================================== TOXASSURE COMP DRUG ANALYSIS,UR ==================================================================== Test                             Result       Flag       Units Drug Present and Declared for Prescription Verification   Hydrocodone                    205  EXPECTED   ng/mg creat   Hydromorphone                  671          EXPECTED   ng/mg creat   Dihydrocodeine                 153          EXPECTED   ng/mg creat   Norhydrocodone                  642          EXPECTED   ng/mg creat    Sources of hydrocodone include scheduled prescription    medications. Hydromorphone, dihydrocodeine and norhydrocodone are    expected metabolites of hydrocodone. Hydromorphone and    dihydrocodeine are also available as scheduled prescription    medications.   Ephedrine/Pseudoephedrine      PRESENT      EXPECTED   Phenylpropanolamine            PRESENT      EXPECTED    Source of ephedrine/pseudoephedrine is most commonly    pseudoephedrine in over-the-counter or prescription cold and    allergy medications. Phenylpropanolamine is an expected    metabolite of ephedrine/pseudoephedrine.   Baclofen                       PRESENT      EXPECTED   Acetaminophen                  PRESENT      EXPECTED Drug Present not Declared for Prescription Verification   Ibuprofen                      PRESENT      UNEXPECTED   Doxylamine                     PRESENT      UNEXPECTED   Dextrorphan/Levorphanol        PRESENT      UNEXPECTED    Dextrorphan is an expected metabolite of dextromethorphan, an    over-the-counter or prescription cough suppressant. Levorphanol    is a scheduled prescription medication. Dextrorphan cannot be    distinguished from levorphanol by the method used for analysis. ==================================================================== Test                      Result    Flag   Units      Ref Range   Creatinine              234              mg/dL      >=20 ==================================================================== Declared Medications:  The flagging and interpretation on this report are based on the  following declared medications.  Unexpected results may arise from  inaccuracies in the declared medications.  **Note: The testing scope of this panel includes these medications:  Baclofen  Hydrocodone (Hydrocodone-Acetaminophen)  Pseudoephedrine (Sudafed)  **Note: The testing scope of this panel does not include small  to  moderate amounts of these reported medications:  Acetaminophen (Hydrocodone-Acetaminophen)  **Note: The testing scope of this panel does not include following  reported medications:  Cholecalciferol  Fexofenadine  Fludrocortisone (Florinef)  Hydrocortisone (Cortef) ==================================================================== For clinical consultation, please call [phone]. ====================================================================    Note: Above Lab results reviewed.  Recent  imaging  DG Si Joints CLINICAL DATA:  Chronic right sacroiliac joint pain.  EXAM: BILATERAL SACROILIAC JOINTS - 3+ VIEW  COMPARISON:  MRI lumbar spine 03/22/2018  FINDINGS: Sacroiliac joint spaces are maintained bilaterally without osteolysis or ankylosis. Mild degenerative changes along the caudal aspect of the SI joints. Probable facet arthropathy near the lumbosacral junction but incompletely characterized on this examination.  IMPRESSION: Symmetric appearance of the SI joints without significant arthropathy.  Electronically Signed   By: Denise Burns M.D.   On: 08/16/2018 16:22  Assessment  The primary encounter diagnosis was Chronic pain syndrome. Diagnoses of Chronic low back pain (Primary Area of Pain) (Bilateral) (R>L) w/o sciatica, DDD (degenerative disc disease), lumbosacral, Chronic sacroiliac joint pain (Secondary Area of Pain) (Right), L5-S1 pars defect w/ spondylolisthesis (Bilateral), Lumbar facet hypertrophy (Multilevel) (Bilateral), Lumbar facet syndrome (Bilateral), Lumbar Grade 1 Retrolisthesis (2 mm) of L2/L3, Chronic musculoskeletal pain, Osteoarthritis of sacroiliac joint (Bilateral), and Osteoarthritis of facet joint of lumbar spine were also pertinent to this visit.  Plan of Care  I have discontinued Adelyn Burns's benzonatate. I am also having her maintain her fludrocortisone, Vitamin D3, pseudoephedrine, fexofenadine, baclofen, meloxicam, and  HYDROcodone-acetaminophen.  Pharmacotherapy (Medications Ordered): Meds ordered this encounter  Medications  . baclofen (LIORESAL) 10 MG tablet    Sig: Take 1 tablet (10 mg total) by mouth daily for 30 days.    Dispense:  30 tablet    Refill:  0    Fill one day early if pharmacy is closed on scheduled refill date. May substitute for generic if available.  . meloxicam (MOBIC) 15 MG tablet    Sig: Take 1 tablet (15 mg total) by mouth daily for 30 days.    Dispense:  30 tablet    Refill:  0    Fill one day early if pharmacy is closed on scheduled refill date. May substitute for generic if available.  [place] HYDROcodone-acetaminophen (NORCO) 7.5-325 MG tablet    Sig: Take 1 tablet by mouth every 8 (eight) hours as needed for up to 30 days for severe pain. Must last 30 days.    Dispense:  90 tablet    Refill:  0    Chronic Pain: STOP Act - Not applicable. Fill 1 day early if closed on scheduled refill date. Do not fill until: 01/17/2019. To last until: 02/16/2019. Instruct to avoid benzodiazepines within 8 hours of opioid.   Orders:  No orders of the defined types were placed in this encounter.  Follow-up plan:   Return in about 1 month (around 02/06/2019) for (Virtual), E/M, (Med-Mgmt).  Pending lumbar facet blocks on 01/17/2019.   I discussed the assessment and treatment plan with the patient. The patient was provided an opportunity to ask questions and all were answered. The patient agreed with the plan and demonstrated an understanding of the instructions.  Patient advised to call back or seek an in-person evaluation if the symptoms or condition worsens.  Total duration of non-face-to-face encounter: 13 minutes.  Note by: [name], MD Date: 01/07/2019; Time: 1:13 PM  Note: This dictation was prepared with Dragon dictation. Any transcriptional errors that may result from this process are unintentional.  Disclaimer:  * Given the special circumstances of the COVID-19 pandemic, the  federal government has announced that the Office for Civil Rights (OCR) will exercise its enforcement discretion and will not impose penalties on physicians using telehealth in the event of noncompliance with regulatory requirements under the [place] and [place] (HIPAA)  in connection with the good faith provision of telehealth during the BLTGA-89 national public health emergency. (AMA)

## 2019-01-07 ENCOUNTER — Ambulatory Visit: Payer: No Typology Code available for payment source | Attending: Pain Medicine | Admitting: Pain Medicine

## 2019-01-07 ENCOUNTER — Other Ambulatory Visit: Payer: Self-pay

## 2019-01-07 DIAGNOSIS — M47816 Spondylosis without myelopathy or radiculopathy, lumbar region: Secondary | ICD-10-CM

## 2019-01-07 DIAGNOSIS — M5137 Other intervertebral disc degeneration, lumbosacral region: Secondary | ICD-10-CM | POA: Diagnosis not present

## 2019-01-07 DIAGNOSIS — M7918 Myalgia, other site: Secondary | ICD-10-CM

## 2019-01-07 DIAGNOSIS — M461 Sacroiliitis, not elsewhere classified: Secondary | ICD-10-CM

## 2019-01-07 DIAGNOSIS — G8929 Other chronic pain: Secondary | ICD-10-CM

## 2019-01-07 DIAGNOSIS — M545 Low back pain, unspecified: Secondary | ICD-10-CM

## 2019-01-07 DIAGNOSIS — M4310 Spondylolisthesis, site unspecified: Secondary | ICD-10-CM

## 2019-01-07 DIAGNOSIS — M533 Sacrococcygeal disorders, not elsewhere classified: Secondary | ICD-10-CM

## 2019-01-07 DIAGNOSIS — G894 Chronic pain syndrome: Secondary | ICD-10-CM

## 2019-01-07 DIAGNOSIS — M4300 Spondylolysis, site unspecified: Secondary | ICD-10-CM

## 2019-01-07 DIAGNOSIS — M47818 Spondylosis without myelopathy or radiculopathy, sacral and sacrococcygeal region: Secondary | ICD-10-CM

## 2019-01-07 MED ORDER — MELOXICAM 15 MG PO TABS: 15.0000 mg | ORAL_TABLET | Freq: Every day | ORAL | 0 refills | Status: DC

## 2019-01-07 MED ORDER — HYDROCODONE-ACETAMINOPHEN 7.5-325 MG PO TABS: 1.0000 | ORAL_TABLET | Freq: Three times a day (TID) | ORAL | 0 refills | Status: DC | PRN

## 2019-01-07 MED ORDER — BACLOFEN 10 MG PO TABS: 10.0000 mg | ORAL_TABLET | Freq: Every day | ORAL | 0 refills | Status: DC

## 2019-01-07 MED FILL — MELOXICAM 15 MG TABLET: 15 | 30 days supply | Qty: 30 | Fill #0

## 2019-01-07 MED FILL — BACLOFEN 10 MG TABS: 10 | 30 days supply | Qty: 30 | Fill #0

## 2019-01-10 MED FILL — FLUDROCORTISONE 0.1 MG TAB: 0.1 | 90 days supply | Qty: 45 | Fill #3

## 2019-01-11 ENCOUNTER — Encounter: Payer: Self-pay | Admitting: Family Medicine

## 2019-01-11 ENCOUNTER — Ambulatory Visit (INDEPENDENT_AMBULATORY_CARE_PROVIDER_SITE_OTHER): Payer: No Typology Code available for payment source | Admitting: Family Medicine

## 2019-01-11 DIAGNOSIS — L247 Irritant contact dermatitis due to plants, except food: Secondary | ICD-10-CM | POA: Diagnosis not present

## 2019-01-11 MED ORDER — DESONIDE 0.05 % EX CREA: TOPICAL_CREAM | CUTANEOUS | 0 refills | Status: DC

## 2019-01-11 MED ORDER — METHYLPREDNISOLONE 4 MG PO TABS: ORAL_TABLET | ORAL | 0 refills | Status: DC

## 2019-01-11 MED FILL — DESONIDE 0.05% CREAM: 0.05 | 10 days supply | Qty: 30 | Fill #0

## 2019-01-11 MED FILL — METHYLPREDNISOLONE 4 MG TBP: 4 | 6 days supply | Qty: 21 | Fill #0

## 2019-01-11 NOTE — Progress Notes (Signed)
Virtual Visit via Video Note  I connected with Denise Burns on 01/11/19 at 12:15 PM EDT by a video enabled telemedicine application and verified that I am speaking with the correct person using two identifiers.  Location: Patient:  Provider: [place]   I discussed the limitations of evaluation and management by telemedicine and the availability of in person appointments. The patient expressed understanding and agreed to proceed.  History of Present Illness: This is a 48 year old female who requests virtual's virtual visit today to discuss rash.  She has had a rash on her face and neck with lines of redness on her arms and fingers for 3 days.  She has been have recently moved into a new house and the yard is overgrown and they have been clearing out weeds and such.  She is not sure if she has come in contact with poison oak/sumac/ivy.  She has not used any new soaps, shampoos, detergents, lotions.  She has been taking Zyrtec with little relief.  Past Medical History:  Diagnosis Date  . Addison disease ([place])   . Arthritis   . Back pain   . GERD (gastroesophageal reflux disease)    Past Surgical History:  Procedure Laterality Date  . CHOLECYSTECTOMY    . POLYPECTOMY     Family History  Problem Relation Age of Onset  . Arthritis Mother   . Hyperlipidemia Mother   . Hypertension Mother   . Alcohol abuse Father   . Hearing loss Father   . Hyperlipidemia Father    Social History   Tobacco Use  . Smoking status: Former Research scientist (life sciences)  . Smokeless tobacco: Never Used  Substance Use Topics  . Alcohol use: Yes    Comment: occ  . Drug use: Never      Observations/Objective: The patient is alert and answers questions appropriately.  Below her right eye is an area of linear erythema that appears raised.  She has a similar area on her right cheek in front of her ear.  There is a small vesicle on her right forearm.  LMP 06/08/2018 (Exact Date)  Wt Readings from Last 3 Encounters:   10/17/18 185 lb (83.9 kg)  10/03/18 182 lb 8 oz (82.8 kg)  09/03/18 190 lb (86.2 kg)    Assessment and Plan: 1. Irritant contact dermatitis due to plants, except food -Discussed diagnosis and treatment with patient including return to clinic/ER/urgent care precautions since it is on her face.  She was instructed to go to the emergency room or urgent care if it gets to her eyelids. - methylPREDNISolone (MEDROL) 4 MG tablet; Take 6 tablets x 1 day, 5 x 1, 4 x1, 3x1, 2x1, 1x1  Dispense: 21 tablet; Refill: 0 - desonide (DESOWEN) 0.05 % cream; Apply sparingly to rash twice a day. Do not use more than 7 days on face, 10 days on body.  Dispense: 30 g; Refill: 0   Clarene Reamer, FNP-BC  [facility]  01/11/2019 12:32 PM   Follow Up Instructions: Visit recap sent to patient via my chart.   I discussed the assessment and treatment plan with the patient. The patient was provided an opportunity to ask questions and all were answered. The patient agreed with the plan and demonstrated an understanding of the instructions.   The patient was advised to call back or seek an in-person evaluation if the symptoms worsen or if the condition fails to improve as anticipated.    Dalbert Batman  Carlean Purl, [place]

## 2019-01-14 ENCOUNTER — Other Ambulatory Visit: Payer: Self-pay

## 2019-01-14 ENCOUNTER — Other Ambulatory Visit
Admission: RE | Admit: 2019-01-14 | Discharge: 2019-01-14 | Disposition: A | Discharge status: Home / Self Care | Payer: No Typology Code available for payment source | Source: Ambulatory Visit | Attending: Pain Medicine | Admitting: Pain Medicine

## 2019-01-14 DIAGNOSIS — Z1159 Encounter for screening for other viral diseases: Secondary | ICD-10-CM | POA: Insufficient documentation

## 2019-01-15 ENCOUNTER — Ambulatory Visit: Payer: No Typology Code available for payment source | Admitting: Pain Medicine

## 2019-01-15 LAB — NOVEL CORONAVIRUS, NAA (HOSP ORDER, SEND-OUT TO REF LAB; TAT 18-24 HRS): SARS-CoV-2, NAA: NOT DETECTED

## 2019-01-16 DIAGNOSIS — M47817 Spondylosis without myelopathy or radiculopathy, lumbosacral region: Secondary | ICD-10-CM | POA: Insufficient documentation

## 2019-01-16 MED FILL — VITAMIN D3 50000 UNIT CAPS: 1.25 MG | 84 days supply | Qty: 12 | Fill #3

## 2019-01-16 MED FILL — HYDROCORTISONE 10 MG TABLET: 10 | 90 days supply | Qty: 180 | Fill #2

## 2019-01-16 NOTE — Patient Instructions (Signed)

## 2019-01-16 NOTE — Progress Notes (Signed)
Patient's Name: Denise Burns  MRN: [id number]  Referring Provider: Elby Beck, FNP  DOB: [birth year]  PCP: Elby Beck, FNP  DOS: 01/17/2019  Note by: [name], MD  Service setting: Ambulatory outpatient  Specialty: Interventional Pain Management  Patient type: Established  Location: [facility] (AMB) Pain Management Facility  Visit type: Interventional Procedure   Primary Reason for Visit: Interventional Pain Management Treatment. CC: Back Pain (bilateral)  Procedure:          Anesthesia, Analgesia, Anxiolysis:  Type: Lumbar Facet, Medial Branch Block(s) #1  Primary Purpose: Diagnostic Region: Posterolateral Lumbosacral Spine Level: L2, L3, L4, L5, & S1 Medial Branch Level(s). Injecting these levels blocks the L3-4, L4-5, and L5-S1 lumbar facet joints. Laterality: Bilateral  Type: Moderate (Conscious) Sedation combined with Local Anesthesia Indication(s): Analgesia and Anxiety Route: Intravenous (IV) IV Access: Secured Sedation: Meaningful verbal contact was maintained at all times during the procedure  Local Anesthetic: Lidocaine 1-2%  Position: Prone   Indications: 1. Lumbar facet syndrome (Bilateral)   2. Spondylosis without myelopathy or radiculopathy, lumbosacral region   3. DDD (degenerative disc disease), lumbosacral   4. Lumbar facet hypertrophy (Multilevel) (Bilateral)    Pain Score: Pre-procedure: 4 /10 Post-procedure: 0-No pain/10  Pre-op Assessment:  Denise Burns is a 48 y.o. (year old), female patient, seen today for interventional treatment. She  has a past surgical history that includes Cholecystectomy and Polypectomy. Denise Burns has a current medication list which includes the following prescription(s): baclofen, cetirizine, vitamin d3, desonide, fludrocortisone, hydrocodone-acetaminophen, hydrocortisone, meloxicam, pseudoephedrine, and methylprednisolone, and the following Facility-Administered Medications: fentanyl and midazolam. Her primarily  concern today is the Back Pain (bilateral)  Initial Vital Signs:  Pulse/HCG Rate: 81ECG Heart Rate: 80 Temp: 98.3 F (36.8 C) Resp: 16 BP: 125/90 SpO2: 100 %  BMI: Estimated body mass index is 31.73 kg/m as calculated from the following:   Height as of this encounter: 5' 3.5" (1.613 m).   Weight as of this encounter: 182 lb (82.6 kg).  Risk Assessment: Allergies: Reviewed. She is allergic to lisinopril; ultram [tramadol]; and sulfa antibiotics.  Allergy Precautions: None required Coagulopathies: Reviewed. None identified.  Blood-thinner therapy: None at this time Active Infection(s): Reviewed. None identified. Denise Burns is afebrile  Site Confirmation: Denise Burns was asked to confirm the procedure and laterality before marking the site Procedure checklist: Completed Consent: Before the procedure and under the influence of no sedative(s), amnesic(s), or anxiolytics, the patient was informed of the treatment options, risks and possible complications. To fulfill our ethical and legal obligations, as recommended by the American Medical Association's Code of Ethics, I have informed the patient of my clinical impression; the nature and purpose of the treatment or procedure; the risks, benefits, and possible complications of the intervention; the alternatives, including doing nothing; the risk(s) and benefit(s) of the alternative treatment(s) or procedure(s); and the risk(s) and benefit(s) of doing nothing. The patient was provided information about the general risks and possible complications associated with the procedure. These may include, but are not limited to: failure to achieve desired goals, infection, bleeding, organ or nerve damage, allergic reactions, paralysis, and death. In addition, the patient was informed of those risks and complications associated to Spine-related procedures, such as failure to decrease pain; infection (i.e.: Meningitis, epidural or intraspinal abscess);  bleeding (i.e.: epidural hematoma, subarachnoid hemorrhage, or any other type of intraspinal or peri-dural bleeding); organ or nerve damage (i.e.: Any type of peripheral nerve, nerve root, or spinal cord injury) with subsequent damage to  sensory, motor, and/or autonomic systems, resulting in permanent pain, numbness, and/or weakness of one or several areas of the body; allergic reactions; (i.e.: anaphylactic reaction); and/or death. Furthermore, the patient was informed of those risks and complications associated with the medications. These include, but are not limited to: allergic reactions (i.e.: anaphylactic or anaphylactoid reaction(s)); adrenal axis suppression; blood sugar elevation that in diabetics may result in ketoacidosis or comma; water retention that in patients with history of congestive heart failure may result in shortness of breath, pulmonary edema, and decompensation with resultant heart failure; weight gain; swelling or edema; medication-induced neural toxicity; particulate matter embolism and blood vessel occlusion with resultant organ, and/or nervous system infarction; and/or aseptic necrosis of one or more joints. Finally, the patient was informed that Medicine is not an exact science; therefore, there is also the possibility of unforeseen or unpredictable risks and/or possible complications that may result in a catastrophic outcome. The patient indicated having understood very clearly. We have given the patient no guarantees and we have made no promises. Enough time was given to the patient to ask questions, all of which were answered to the patient's satisfaction. Denise Burns has indicated that she wanted to continue with the procedure. Attestation: I, the ordering provider, attest that I have discussed with the patient the benefits, risks, side-effects, alternatives, likelihood of achieving goals, and potential problems during recovery for the procedure that I have provided informed  consent. Date   Time: 01/17/2019  9:02 AM  Pre-Procedure Preparation:  Monitoring: As per clinic protocol. Respiration, ETCO2, SpO2, BP, heart rate and rhythm monitor placed and checked for adequate function Safety Precautions: Patient was assessed for positional comfort and pressure points before starting the procedure. Time-out: I initiated and conducted the "Time-out" before starting the procedure, as per protocol. The patient was asked to participate by confirming the accuracy of the "Time Out" information. Verification of the correct person, site, and procedure were performed and confirmed by me, the nursing staff, and the patient. "Time-out" conducted as per Joint Commission's Universal Protocol (UP.01.01.01). Time: [phone]  Description of Procedure:          Laterality: Bilateral. The procedure was performed in identical fashion on both sides. Levels:  L2, L3, L4, L5, & S1 Medial Branch Level(s) Area Prepped: Posterior Lumbosacral Region Prepping solution: DuraPrep (Iodine Povacrylex [0.7% available iodine] and Isopropyl Alcohol, 74% w/w) Safety Precautions: Aspiration looking for blood return was conducted prior to all injections. At no point did we inject any substances, as a needle was being advanced. Before injecting, the patient was told to immediately notify me if she was experiencing any new onset of "ringing in the ears, or metallic taste in the mouth". No attempts were made at seeking any paresthesias. Safe injection practices and needle disposal techniques used. Medications properly checked for expiration dates. SDV (single dose vial) medications used. After the completion of the procedure, all disposable equipment used was discarded in the proper designated medical waste containers. Local Anesthesia: Protocol guidelines were followed. The patient was positioned over the fluoroscopy table. The area was prepped in the usual manner. The time-out was completed. The target area was identified  using fluoroscopy. A 12-in long, straight, sterile hemostat was used with fluoroscopic guidance to locate the targets for each level blocked. Once located, the skin was marked with an approved surgical skin marker. Once all sites were marked, the skin (epidermis, dermis, and hypodermis), as well as deeper tissues (fat, connective tissue and muscle) were infiltrated with a small  amount of a short-acting local anesthetic, loaded on a 10cc syringe with a 25G, 1.5-in  Needle. An appropriate amount of time was allowed for local anesthetics to take effect before proceeding to the next step. Local Anesthetic: Lidocaine 2.0% The unused portion of the local anesthetic was discarded in the proper designated containers. Technical explanation of process:  L2 Medial Branch Nerve Block (MBB): The target area for the L2 medial branch is at the junction of the postero-lateral aspect of the superior articular process and the superior, posterior, and medial edge of the transverse process of L3. Under fluoroscopic guidance, a Quincke needle was inserted until contact was made with os over the superior postero-lateral aspect of the pedicular shadow (target area). After negative aspiration for blood, 0.5 mL of the nerve block solution was injected without difficulty or complication. The needle was removed intact. L3 Medial Branch Nerve Block (MBB): The target area for the L3 medial branch is at the junction of the postero-lateral aspect of the superior articular process and the superior, posterior, and medial edge of the transverse process of L4. Under fluoroscopic guidance, a Quincke needle was inserted until contact was made with os over the superior postero-lateral aspect of the pedicular shadow (target area). After negative aspiration for blood, 0.5 mL of the nerve block solution was injected without difficulty or complication. The needle was removed intact. L4 Medial Branch Nerve Block (MBB): The target area for the L4 medial  branch is at the junction of the postero-lateral aspect of the superior articular process and the superior, posterior, and medial edge of the transverse process of L5. Under fluoroscopic guidance, a Quincke needle was inserted until contact was made with os over the superior postero-lateral aspect of the pedicular shadow (target area). After negative aspiration for blood, 0.5 mL of the nerve block solution was injected without difficulty or complication. The needle was removed intact. L5 Medial Branch Nerve Block (MBB): The target area for the L5 medial branch is at the junction of the postero-lateral aspect of the superior articular process and the superior, posterior, and medial edge of the sacral ala. Under fluoroscopic guidance, a Quincke needle was inserted until contact was made with os over the superior postero-lateral aspect of the pedicular shadow (target area). After negative aspiration for blood, 0.5 mL of the nerve block solution was injected without difficulty or complication. The needle was removed intact. S1 Medial Branch Nerve Block (MBB): The target area for the S1 medial branch is at the posterior and inferior 6 o'clock position of the L5-S1 facet joint. Under fluoroscopic guidance, the Quincke needle inserted for the L5 MBB was redirected until contact was made with os over the inferior and postero aspect of the sacrum, at the 6 o' clock position under the L5-S1 facet joint (Target area). After negative aspiration for blood, 0.5 mL of the nerve block solution was injected without difficulty or complication. The needle was removed intact.  Nerve block solution: 0.2% PF-Ropivacaine + Triamcinolone (40 mg/mL) diluted to a final concentration of 4 mg of Triamcinolone/mL of Ropivacaine The unused portion of the solution was discarded in the proper designated containers. Procedural Needles: 22-gauge, 3.5-inch, Quincke needles used for all levels.  Once the entire procedure was completed, the  treated area was cleaned, making sure to leave some of the prepping solution back to take advantage of its long term bactericidal properties.   Illustration of the posterior view of the lumbar spine and the posterior neural structures. Laminae of L2 through S1  are labeled. DPRL5, dorsal primary ramus of L5; DPRS1, dorsal primary ramus of S1; DPR3, dorsal primary ramus of L3; FJ, facet (zygapophyseal) joint L3-L4; I, inferior articular process of L4; LB1, lateral branch of dorsal primary ramus of L1; IAB, inferior articular branches from L3 medial branch (supplies L4-L5 facet joint); IBP, intermediate branch plexus; MB3, medial branch of dorsal primary ramus of L3; NR3, third lumbar nerve root; S, superior articular process of L5; SAB, superior articular branches from L4 (supplies L4-5 facet joint also); TP3, transverse process of L3.  Vitals:   01/17/19 0947 01/17/19 0957 01/17/19 1007 01/17/19 1017  BP: (!) 122/91 129/83 127/88 119/87  Pulse:      Resp: 16 13 14 12   Temp:  (!) 97.3 F (36.3 C)  (!) 97.5 F (36.4 C)  TempSrc:      SpO2: 98% 100% 98% 100%  Weight:      Height:         Start Time: 0936 hrs. End Time: 0945 hrs.  Imaging Guidance (Spinal):          Type of Imaging Technique: Fluoroscopy Guidance (Spinal) Indication(s): Assistance in needle guidance and placement for procedures requiring needle placement in or near specific anatomical locations not easily accessible without such assistance. Exposure Time: Please see nurses notes. Contrast: None used. Fluoroscopic Guidance: I was personally present during the use of fluoroscopy. "Tunnel Vision Technique" used to obtain the best possible view of the target area. Parallax error corrected before commencing the procedure. "Direction-depth-direction" technique used to introduce the needle under continuous pulsed fluoroscopy. Once target was reached, antero-posterior, oblique, and lateral fluoroscopic projection used confirm needle  placement in all planes. Images permanently stored in EMR. Interpretation: No contrast injected. I personally interpreted the imaging intraoperatively. Adequate needle placement confirmed in multiple planes. Permanent images saved into the patient's record.  Antibiotic Prophylaxis:   Anti-infectives (From admission, onward)   None     Indication(s): None identified  Post-operative Assessment:  Post-procedure Vital Signs:  Pulse/HCG Rate: 8186 Temp: (!) 97.5 F (36.4 C) Resp: 12 BP: 119/87 SpO2: 100 %  EBL: None  Complications: No immediate post-treatment complications observed by team, or reported by patient.  Note: The patient tolerated the entire procedure well. A repeat set of vitals were taken after the procedure and the patient was kept under observation following institutional policy, for this type of procedure. Post-procedural neurological assessment was performed, showing return to baseline, prior to discharge. The patient was provided with post-procedure discharge instructions, including a section on how to identify potential problems. Should any problems arise concerning this procedure, the patient was given instructions to immediately contact us, at any time, without hesitation. In any case, we plan to contact the patient by telephone for a follow-up status report regarding this interventional procedure.  Comments:  No additional relevant information.  Plan of Care  Orders:  Orders Placed This Encounter  Procedures   LUMBAR FACET(MEDIAL BRANCH NERVE BLOCK) MBNB    Scheduling Instructions:     Side: Bilateral     Level: L3-4, L4-5, & L5-S1 Facets (L2, L3, L4, L5, & S1 Medial Branch Nerves)     Sedation: With Sedation.     Timeframe: Today    Order Specific Question:   Where will this procedure be performed?    Answer:   [facility]   DG PAIN CLINIC C-ARM 1-60 MIN NO REPORT    Intraoperative interpretation by procedural physician at [place].     Standing  Status:   Standing    Number of Occurrences:   1    Order Specific Question:   Reason for exam:    Answer:   Assistance in needle guidance and placement for procedures requiring needle placement in or near specific anatomical locations not easily accessible without such assistance.   Provider attestation of informed consent for procedure/surgical case    I, the ordering provider, attest that I have discussed with the patient the benefits, risks, side effects, alternatives, likelihood of achieving goals and potential problems during recovery for the procedure that I have provided informed consent.    Standing Status:   Standing    Number of Occurrences:   1   Informed Consent Details: Transcribe to consent form and obtain patient signature    Standing Status:   Standing    Number of Occurrences:   1    Order Specific Question:   Procedure    Answer:   Bilateral Lumbar facet block (medial branch block) under fluoroscopic guidance. (See notes for levels.)    Order Specific Question:   Surgeon    Answer:   Charlei Ramsaran A. Dossie Arbour, MD    Order Specific Question:   Indication/Reason    Answer:   Bilateral low back pain with or without lower extremity pain   Medications ordered for procedure: Meds ordered this encounter  Medications   lidocaine (XYLOCAINE) 2 % (with pres) injection 400 mg   lactated ringers infusion 1,000 mL   midazolam (VERSED) 5 MG/5ML injection 1-2 mg    Make sure Flumazenil is available in the pyxis when using this medication. If oversedation occurs, administer 0.2 mg IV over 15 sec. If after 45 sec no response, administer 0.2 mg again over 1 min; may repeat at 1 min intervals; not to exceed 4 doses (1 mg)   fentaNYL (SUBLIMAZE) injection 25-50 mcg    Make sure Narcan is available in the pyxis when using this medication. In the event of respiratory depression (RR< 8/min): Titrate NARCAN (naloxone) in increments of 0.1 to 0.2 mg IV at 2-3 minute intervals,  until desired degree of reversal.   ropivacaine (PF) 2 mg/mL (0.2%) (NAROPIN) injection 18 mL   triamcinolone acetonide (KENALOG-40) injection 80 mg   Medications administered: We administered lidocaine, lactated ringers, midazolam, fentaNYL, ropivacaine (PF) 2 mg/mL (0.2%), and triamcinolone acetonide.  See the medical record for exact dosing, route, and time of administration.  Disposition: Discharge home  Discharge Date & Time: 01/17/2019; 1019 hrs.   Follow-up plan:   Return in about 2 weeks (around 01/31/2019) for (VV), E/M (PP).     Future Appointments  Date Time Provider [place]  02/05/2019  7:30 AM Shamleffer, Melanie Crazier, MD [facility] None  02/06/2019  8:15 AM Milinda Pointer, MD [facility] None   Primary Care Physician: Elby Beck, FNP Location: [facility] Note by: [name], MD Date: 01/17/2019; Time: 10:19 AM  Disclaimer:  Medicine is not an exact science. The only guarantee in medicine is that nothing is guaranteed. It is important to note that the decision to proceed with this intervention was based on the information collected from the patient. The Data and conclusions were drawn from the patient's questionnaire, the interview, and the physical examination. Because the information was provided in large part by the patient, it cannot be guaranteed that it has not been purposely or unconsciously manipulated. Every effort has been made to obtain as much relevant data as possible for this evaluation. It  is important to note that the conclusions that lead to this procedure are derived in large part from the available data. Always take into account that the treatment will also be dependent on availability of resources and existing treatment guidelines, considered by other Pain Management Practitioners as being common knowledge and practice, at the time of the intervention. For Medico-Legal purposes, it is also important to  point out that variation in procedural techniques and pharmacological choices are the acceptable norm. The indications, contraindications, technique, and results of the above procedure should only be interpreted and judged by a Board-Certified Interventional Pain Specialist with extensive familiarity and expertise in the same exact procedure and technique.

## 2019-01-17 ENCOUNTER — Other Ambulatory Visit: Payer: Self-pay

## 2019-01-17 ENCOUNTER — Encounter: Payer: Self-pay | Admitting: Pain Medicine

## 2019-01-17 ENCOUNTER — Ambulatory Visit
Admission: RE | Admit: 2019-01-17 | Discharge: 2019-01-17 | Disposition: A | Discharge status: Home / Self Care | Payer: No Typology Code available for payment source | Source: Ambulatory Visit | Attending: Pain Medicine | Admitting: Pain Medicine

## 2019-01-17 ENCOUNTER — Ambulatory Visit (HOSPITAL_BASED_OUTPATIENT_CLINIC_OR_DEPARTMENT_OTHER): Payer: No Typology Code available for payment source | Admitting: Pain Medicine

## 2019-01-17 VITALS — BP 119/87 | HR 81 | Temp 97.5°F | Resp 12 | Ht 63.5 in | Wt 182.0 lb

## 2019-01-17 DIAGNOSIS — M47816 Spondylosis without myelopathy or radiculopathy, lumbar region: Secondary | ICD-10-CM

## 2019-01-17 DIAGNOSIS — M47817 Spondylosis without myelopathy or radiculopathy, lumbosacral region: Secondary | ICD-10-CM | POA: Insufficient documentation

## 2019-01-17 DIAGNOSIS — M5137 Other intervertebral disc degeneration, lumbosacral region: Secondary | ICD-10-CM | POA: Insufficient documentation

## 2019-01-17 MED ORDER — LIDOCAINE HCL 2 % IJ SOLN
20.0000 mL | Freq: Once | INTRAMUSCULAR | Status: AC
  Administered 2019-01-17: 400 mg
  Filled 2019-01-17: qty 40

## 2019-01-17 MED ORDER — MIDAZOLAM HCL 5 MG/5ML IJ SOLN
1.0000 mg | INTRAMUSCULAR | Status: DC | PRN
  Administered 2019-01-17: 4 mg via INTRAVENOUS
  Filled 2019-01-17: qty 5

## 2019-01-17 MED ORDER — LACTATED RINGERS IV SOLN
1000.0000 mL | Freq: Once | INTRAVENOUS | Status: AC
  Administered 2019-01-17: 1000 mL via INTRAVENOUS

## 2019-01-17 MED ORDER — ROPIVACAINE HCL 2 MG/ML IJ SOLN
18.0000 mL | Freq: Once | INTRAMUSCULAR | Status: AC
  Administered 2019-01-17: 10 mL via PERINEURAL
  Filled 2019-01-17: qty 20

## 2019-01-17 MED ORDER — TRIAMCINOLONE ACETONIDE 40 MG/ML IJ SUSP
80.0000 mg | Freq: Once | INTRAMUSCULAR | Status: AC
  Administered 2019-01-17: 80 mg
  Filled 2019-01-17: qty 2

## 2019-01-17 MED ORDER — FENTANYL CITRATE (PF) 100 MCG/2ML IJ SOLN
25.0000 ug | INTRAMUSCULAR | Status: DC | PRN
  Administered 2019-01-17: 50 ug via INTRAVENOUS
  Filled 2019-01-17: qty 2

## 2019-01-18 ENCOUNTER — Telehealth: Payer: Self-pay

## 2019-01-18 MED FILL — HYDROCODON-APAP 7.5-325: 7.5-325 | 30 days supply | Qty: 90 | Fill #0

## 2019-01-18 NOTE — Telephone Encounter (Signed)
Post procedure phone call.  LM 

## 2019-01-23 ENCOUNTER — Encounter: Payer: Self-pay | Admitting: Family Medicine

## 2019-01-31 ENCOUNTER — Other Ambulatory Visit: Payer: Self-pay

## 2019-02-05 ENCOUNTER — Other Ambulatory Visit: Payer: Self-pay

## 2019-02-05 ENCOUNTER — Encounter: Payer: Self-pay | Admitting: Internal Medicine

## 2019-02-05 ENCOUNTER — Encounter: Payer: Self-pay | Admitting: Pain Medicine

## 2019-02-05 ENCOUNTER — Ambulatory Visit: Payer: No Typology Code available for payment source | Admitting: Internal Medicine

## 2019-02-05 VITALS — BP 124/68 | HR 74 | Temp 98.6°F | Ht 64.0 in | Wt 182.8 lb

## 2019-02-05 DIAGNOSIS — E271 Primary adrenocortical insufficiency: Secondary | ICD-10-CM

## 2019-02-05 DIAGNOSIS — E288 Other ovarian dysfunction: Secondary | ICD-10-CM

## 2019-02-05 DIAGNOSIS — E2839 Other primary ovarian failure: Secondary | ICD-10-CM

## 2019-02-05 LAB — BASIC METABOLIC PANEL
BUN: 19 mg/dL (ref 6–23)
CO2: 27 mEq/L (ref 19–32)
Calcium: 8.8 mg/dL (ref 8.4–10.5)
Chloride: 106 mEq/L (ref 96–112)
Creatinine, Ser: 0.8 mg/dL (ref 0.40–1.20)
GFR: 76.45 mL/min (ref >60.00)
Glucose, Bld: 83 mg/dL (ref 70–99)
Potassium: 3.5 mEq/L (ref 3.5–5.1)
Sodium: 140 mEq/L (ref 135–145)

## 2019-02-05 MED ORDER — HYDROCORTISONE 10 MG PO TABS: ORAL_TABLET | ORAL | 3 refills | Status: DC

## 2019-02-05 NOTE — Patient Instructions (Signed)
MEDICATIONS:   - Hydrocortisone 15 mg every morning - Hydrocortisone 5 mg every afternoon ( Between 2-4 pm) - Continue Florinef at half a tablet daily       ADRENAL INSUFFICIENCY SICK DAY RULES:  Should you face an extreme emotional or physical stress such as trauma, surgery or acute illness, this will require extra steroid coverage so that the body can meet that stress.   Without increasing the steroid dose you may experience severe weakness, headache, dizziness, nausea and vomiting and possibly a more serious deterioration in health.  Typically the dose of steroids will only need to be increased for a couple of days if you have an illness that is transient and managed in the community.   If you are unable to take/absorb an increased dose of steroids orally because of vomiting or diarrhea, you will urgently require steroid injections and should present to an Emergency Department.  The general advice for any serious illness is as follows: 1. Double the normal daily steroid dose for up to 3 days if you have a temperature of more than 37.50C (99.50F) with signs of sickness, or severe emotional or physical distress 2. Contact your primary care doctor and Endocrinologist if the illness worsens or it lasts for more than 3 days.  3. In cases of severe illness, urgent medical assistance should be promptly sought. 4. If you experience vomiting/diarrhea or are unable to take steroids by mouth, please administer the Hydrocortisone injection kit and seek urgent medical help.      

## 2019-02-05 NOTE — Progress Notes (Signed)
Name: Denise Burns  MRN/ DOB: [id number], 21-Sep-1970    Age/ Sex: 48 y.o., female     PCP: Elby Beck, FNP   Reason for Endocrinology Evaluation: Addison's Disease     Initial Endocrinology Clinic Visit: 08/06/2018    PATIENT IDENTIFIER: Denise Burns is a 48 y.o., female with a past medical history of Addison's Disease and premature ovarian  failure . She has followed with [facility] since 08/06/2018 for consultative assistance with management of her adrenal insufficiency.   HISTORICAL SUMMARY: The patient was first diagnosed with addison's disease in 2006. She presented with weight loss, hyperpigmentation, and  hypotension. She did not have cosyntropin stim test. She has been on [facility] and Florinef since her diagnosis.   Premature ovarian failure ~ 2017. She did have a postmenopausal bleed between 05/2018- 06/2018  No FH of premature ovarian failure or autoimmune disease.   SUBJECTIVE:   During last visit (08/06/2018): Continued HC dose 15 mg in AM and 5 mg QPM  Today (02/05/2019):  Ms. Mauck is here for a 6 month follow up on adrenal insufficiency. She has been compliant with HC intake.  She denies any weight loss, nausea, dizziness or diarrhea.  She continues to wear her bracelet.   She does receive intra-articular injection of the spine ~ 2x per years She recently received medrol pack for contact dermatitis.   She has established with new Gyn but no interventions so far, found ovarian cysts, further imaging delayed due to [place].   ROS:  As per HPI.   HISTORY:  Past Medical History:  Past Medical History:  Diagnosis Date  . Addison disease ([place])   . Arthritis   . Back pain   . GERD (gastroesophageal reflux disease)    Past Surgical History:  Past Surgical History:  Procedure Laterality Date  . CHOLECYSTECTOMY    . POLYPECTOMY      Social History:  reports that she has quit smoking. She has never used smokeless tobacco. She reports  current alcohol use. She reports that she does not use drugs. Family History:  Family History  Problem Relation Age of Onset  . Arthritis Mother   . Hyperlipidemia Mother   . Hypertension Mother   . Alcohol abuse Father   . Hearing loss Father   . Hyperlipidemia Father      HOME MEDICATIONS: Allergies as of 02/05/2019      Reactions   Lisinopril Cough   Ultram [tramadol] Other (See Comments)   Sulfa Antibiotics Other (See Comments)      Medication List       Accurate as of February 05, 2019  8:09 AM. If you have any questions, ask your nurse or doctor.        STOP taking these medications   methylPREDNISolone 4 MG tablet Commonly known as: MEDROL Stopped by: Dorita Sciara, MD     TAKE these medications   baclofen 10 MG tablet Commonly known as: LIORESAL Take 1 tablet (10 mg total) by mouth daily for 30 days.   cetirizine 10 MG tablet Commonly known as: ZYRTEC Take 10 mg by mouth daily.   desonide 0.05 % cream Commonly known as: DesOwen Apply sparingly to rash twice a day. Do not use more than 7 days on face, 10 days on body.   fludrocortisone 0.1 MG tablet Commonly known as: FLORINEF Take 0.05 mg by mouth daily.   HYDROcodone-acetaminophen 7.5-325 MG tablet Commonly known as: NORCO Take 1 tablet by mouth every  8 (eight) hours as needed for up to 30 days for severe pain. Must last 30 days.   hydrocortisone 10 MG tablet Commonly known as: CORTEF Take 1.5 tablets (15 mg total) by mouth daily with breakfast AND 0.5 tablets (5 mg total) daily. What changed:   medication strength  See the new instructions. Changed by: Dorita Sciara, MD   meloxicam 15 MG tablet Commonly known as: MOBIC Take 1 tablet (15 mg total) by mouth daily for 30 days.   pseudoephedrine 30 MG tablet Commonly known as: SUDAFED Take 30 mg by mouth every 4 (four) hours as needed for congestion.   Vitamin D3 1.25 MG (50000 UT) Tabs Take 1 tablet by mouth every 7 (seven) days.          OBJECTIVE:   PHYSICAL EXAM: VS: BP 124/68 (BP Location: Left Arm, Patient Position: Sitting, Cuff Size: Normal)   Pulse 74   Temp 98.6 F (37 C)   Ht 5\' 4"  (1.626 m)   Wt 182 lb 12.8 oz (82.9 kg)   LMP 06/08/2018 (Exact Date)   SpO2 98%   BMI 31.38 kg/m    EXAM: General: Pt appears well and is in NAD  Neck: General: Supple without adenopathy. Thyroid: Thyroid size normal.  No goiter or nodules appreciated. No thyroid bruit.  Lungs: Clear with good BS bilat with no rales, rhonchi, or wheezes  Heart: Auscultation: RRR.  Abdomen: Normoactive bowel sounds, soft, nontender, without masses or organomegaly palpable  Extremities:  BL LE: No pretibial edema normal ROM and strength.  Skin: Hair: Texture and amount normal with gender appropriate distribution Skin Inspection: No rashes Skin Palpation: Skin temperature, texture, and thickness normal to palpation  Neuro: Cranial nerves: II - XII grossly intact  Motor: Normal strength throughout DTRs: 2+ and symmetric in UE without delay in relaxation phase  Mental Status: Judgment, insight: Intact Orientation: Oriented to time, place, and person Mood and affect: No depression, anxiety, or agitation     DATA REVIEWED:  Results for MARDY, HOPPE (MRN [id number]) as of 02/05/2019 13:03  Ref. Range 02/05/2019 07:57  Sodium Latest Ref Range: 135 - 145 mEq/L 140  Potassium Latest Ref Range: 3.5 - 5.1 mEq/L 3.5  Chloride Latest Ref Range: 96 - 112 mEq/L 106  CO2 Latest Ref Range: 19 - 32 mEq/L 27  Glucose Latest Ref Range: 70 - 99 mg/dL 83  BUN Latest Ref Range: 6 - 23 mg/dL 19  Creatinine Latest Ref Range: 0.40 - 1.20 mg/dL 0.80  Calcium Latest Ref Range: 8.4 - 10.5 mg/dL 8.8  GFR Latest Ref Range: >60.00 mL/min 76.45     ASSESSMENT / PLAN / RECOMMENDATIONS:   1. Primary Adrenal Insufficiency (Addison's Disease)   - Pt is asymptomatic, based on her BSA she is on appropriate amount of Hydrocortisone. - Reviewed the sick  day rule - She already wears a medical alert bracelet. - Has the 10 mg tablets of HC - BMP normal today   Medications : - Hydrocortisone 15 mg QAM - Hydrocortisone 5 mh QPM (between 2-4 pm) - Continue Fludrocortisone  0.1 mg , at half a tablet daily    2. Premature Ovarian Failure :  - Pt is established with a new Gyn - Vaginal bleeding has ceased - Discussed bone health and the need to make sure she is consuming 1-2 servings of calcium daily - She is on 1 tablet of MVI    F/u in 6 months    Signed electronically by: Elenora Gamma  Kelton Pillar, MD  [facility]  [facility] [address] [place], [place] [address] Phone: [phone] FAX: [phone]      CC: Elby Beck, [place] [address] Phone: [phone]  Fax: [phone]   Return to Endocrinology clinic as below: Future Appointments  Date Time Provider [place]  02/06/2019  8:15 AM Milinda Pointer, MD [facility] None  08/13/2019  7:30 AM , Melanie Crazier, MD [facility] None

## 2019-02-05 NOTE — Progress Notes (Signed)
Pain Management Virtual Encounter Note - Virtual Visit via Telephone Telehealth (real-time audio visits between healthcare provider and patient).   Patient's Phone No. & Preferred Pharmacy:  [phone] (home); [phone] (mobile); (Preferred) [phone] [email or web address]  [place] [place] [address] Phone: [phone] Fax: [phone]    Pre-screening note:  Our staff contacted Ms. Argyle and offered her an "in person", "face-to-face" appointment versus a telephone encounter. She indicated preferring the telephone encounter, at this time.   Reason for Virtual Visit: COVID-19*  Social distancing based on CDC and AMA recommendations.   I contacted Braulio Conte on 02/06/2019 via telephone.      I clearly identified myself as [name], MD. I verified that I was speaking with the correct person using two identifiers (Name: Kenesha Moshier, and date of birth: 07-Dec-1970).  Advanced Informed Consent I sought verbal advanced consent from Braulio Conte for virtual visit interactions. I informed Ms. Bow of possible security and privacy concerns, risks, and limitations associated with providing "not-in-person" medical evaluation and management services. I also informed Ms. Quijas of the availability of "in-person" appointments. Finally, I informed her that there would be a charge for the virtual visit and that she could be  personally, fully or partially, financially responsible for it. Ms. Greenfield expressed understanding and agreed to proceed.   Historic Elements   Ms. Twanisha Foulk is a 48 y.o. year old, female patient evaluated today after her last encounter by our practice on 01/18/2019. Ms. Upshaw  has a past medical history of Addison disease ([place]), Arthritis, Back pain, and GERD (gastroesophageal reflux disease). She also  has a past surgical history that includes Cholecystectomy and  Polypectomy. Ms. Rossetti has a current medication list which includes the following prescription(s): baclofen, cetirizine, vitamin d3, fludrocortisone, hydrocodone-acetaminophen, hydrocodone-acetaminophen, hydrocodone-acetaminophen, hydrocortisone, meloxicam, pseudoephedrine, and desonide. She  reports that she has quit smoking. She has never used smokeless tobacco. She reports current alcohol use. She reports that she does not use drugs. Ms. Kalman is allergic to lisinopril; ultram [tramadol]; and sulfa antibiotics.   HPI  Today, she is being contacted for both, medication management and a post-procedure assessment.  The patient indicates having attained 100% relief of the low back pain for the first day and then it slowly went down to only 40%.  What this tells me is that this is exactly where the low back pain is coming from, but her pain is not being sustained only by an inflammatory process, but a combination of inflammation and mechanical changes.  Today we talked about test results on my interpretation and we have come to the conclusion that once the pain gets worse, then she will call us and schedule her second diagnostic bilateral lumbar facet block.  If she gets similar results, then we will consider radiofrequency ablation.   Post-Procedure Evaluation  Procedure: Diagnostic bilateral lumbar facet block #1 under fluoroscopic guidance and IV sedation Pre-procedure pain level:  4/10 Post-procedure: 0/10 (100% relief)  Sedation: Sedation provided.  Effectiveness during initial hour after procedure(Ultra-Short Term Relief): 100 %   Local anesthetic used: Long-acting (4-6 hours) Effectiveness: Defined as any analgesic benefit obtained secondary to the administration of local anesthetics. This carries significant diagnostic value as to the etiological location, or anatomical origin, of the pain. Duration of benefit is expected to coincide with the duration of the local anesthetic used.   Effectiveness during initial 4-6 hours after procedure(Short-Term Relief): 100 %  Long-term benefit: Defined as any relief past the pharmacologic duration of the local anesthetics.  Effectiveness past the initial 6 hours after procedure(Long-Term Relief): 40 %   Current benefits: Defined as benefit that persist at this time.   Analgesia:  <50% better Function: Ms. Stell reports improvement in function ROM: Ms. Hearns reports improvement in ROM  Pharmacotherapy Assessment  Analgesic: Hydrocodone/APAP 7.5/325 1 tablet p.o. 3 times daily (22.5 mg/day of hydrocodone) MME/day:22.5mg /day.   Monitoring: Pharmacotherapy: No side-effects or adverse reactions reported. [place] PMP: PDMP reviewed during this encounter.       Compliance: No problems identified. Effectiveness: Clinically acceptable. Plan: Refer to "POC".  Pertinent Labs   SAFETY SCREENING Profile Lab Results  Component Value Date   SARSCOV2NAA NOT DETECTED 01/14/2019   COVIDSOURCE NASOPHARYNGEAL 01/14/2019   Renal Function Lab Results  Component Value Date   BUN 19 02/05/2019   CREATININE 0.80 02/05/2019   BCR 18 08/16/2018   GFRAA 86 08/16/2018   GFRNONAA 74 08/16/2018   Hepatic Function Lab Results  Component Value Date   AST 18 08/16/2018   ALBUMIN 4.4 08/16/2018   UDS Summary  Date Value Ref Range Status  08/16/2018 FINAL  Final    Comment:    ==================================================================== TOXASSURE COMP DRUG ANALYSIS,UR ==================================================================== Test                             Result       Flag       Units Drug Present and Declared for Prescription Verification   Hydrocodone                    205          EXPECTED   ng/mg creat   Hydromorphone                  671          EXPECTED   ng/mg creat   Dihydrocodeine                 153          EXPECTED   ng/mg creat   Norhydrocodone                 642          EXPECTED   ng/mg  creat    Sources of hydrocodone include scheduled prescription    medications. Hydromorphone, dihydrocodeine and norhydrocodone are    expected metabolites of hydrocodone. Hydromorphone and    dihydrocodeine are also available as scheduled prescription    medications.   Ephedrine/Pseudoephedrine      PRESENT      EXPECTED   Phenylpropanolamine            PRESENT      EXPECTED    Source of ephedrine/pseudoephedrine is most commonly    pseudoephedrine in over-the-counter or prescription cold and    allergy medications. Phenylpropanolamine is an expected    metabolite of ephedrine/pseudoephedrine.   Baclofen                       PRESENT      EXPECTED   Acetaminophen                  PRESENT      EXPECTED Drug Present not Declared for Prescription Verification   Ibuprofen  PRESENT      UNEXPECTED   Doxylamine                     PRESENT      UNEXPECTED   Dextrorphan/Levorphanol        PRESENT      UNEXPECTED    Dextrorphan is an expected metabolite of dextromethorphan, an    over-the-counter or prescription cough suppressant. Levorphanol    is a scheduled prescription medication. Dextrorphan cannot be    distinguished from levorphanol by the method used for analysis. ==================================================================== Test                      Result    Flag   Units      Ref Range   Creatinine              234              mg/dL      >=20 ==================================================================== Declared Medications:  The flagging and interpretation on this report are based on the  following declared medications.  Unexpected results may arise from  inaccuracies in the declared medications.  **Note: The testing scope of this panel includes these medications:  Baclofen  Hydrocodone (Hydrocodone-Acetaminophen)  Pseudoephedrine (Sudafed)  **Note: The testing scope of this panel does not include small to  moderate amounts of these reported  medications:  Acetaminophen (Hydrocodone-Acetaminophen)  **Note: The testing scope of this panel does not include following  reported medications:  Cholecalciferol  Fexofenadine  Fludrocortisone (Florinef)  Hydrocortisone (Cortef) ==================================================================== For clinical consultation, please call [phone]. ====================================================================    Note: Above Lab results reviewed.  Recent imaging  DG PAIN CLINIC C-ARM 1-60 MIN NO REPORT Fluoro was used, but no Radiologist interpretation will be provided.  Please refer to "NOTES" tab for provider progress note.  Assessment  The primary encounter diagnosis was Chronic pain syndrome. Diagnoses of Chronic low back pain (Primary Area of Pain) (Bilateral) (R>L) w/o sciatica, Chronic sacroiliac joint pain (Secondary Area of Pain) (Right), Chronic musculoskeletal pain, and Lumbar facet syndrome (Bilateral) were also pertinent to this visit.  Plan of Care  I have changed Jeania Debow's baclofen and HYDROcodone-acetaminophen. I am also having her start on HYDROcodone-acetaminophen and HYDROcodone-acetaminophen. Additionally, I am having her maintain her fludrocortisone, Vitamin D3, pseudoephedrine, meloxicam, cetirizine, desonide, and hydrocortisone.  Pharmacotherapy (Medications Ordered): Meds ordered this encounter  Medications  . baclofen (LIORESAL) 10 MG tablet    Sig: Take 1 tablet (10 mg total) by mouth daily.    Dispense:  30 tablet    Refill:  2    Fill one day early if pharmacy is closed on scheduled refill date. May substitute for generic if available.  Marland Kitchen HYDROcodone-acetaminophen (NORCO) 7.5-325 MG tablet    Sig: Take 1 tablet by mouth every 8 (eight) hours as needed for severe pain. Must last 30 days.    Dispense:  90 tablet    Refill:  0    Chronic Pain: STOP Act - Not applicable. Fill 1 day early if closed on scheduled refill date. Do not fill  until: 02/16/2019. To last until: 03/18/2019. Instruct to avoid benzodiazepines within 8 hours of opioid.  Marland Kitchen HYDROcodone-acetaminophen (NORCO) 7.5-325 MG tablet    Sig: Take 1 tablet by mouth every 8 (eight) hours as needed for severe pain. Must last 30 days.    Dispense:  90 tablet    Refill:  0    Chronic Pain: STOP  Act - Not applicable. Fill 1 day early if closed on scheduled refill date. Do not fill until: 03/18/2019. To last until: 04/17/2019. Instruct to avoid benzodiazepines within 8 hours of opioid.  Marland Kitchen HYDROcodone-acetaminophen (NORCO) 7.5-325 MG tablet    Sig: Take 1 tablet by mouth every 8 (eight) hours as needed for severe pain. Must last 30 days.    Dispense:  90 tablet    Refill:  0    Chronic Pain: STOP Act - Not applicable. Fill 1 day early if closed on scheduled refill date. Do not fill until: 04/17/2019. To last until: 05/17/2019. Instruct to avoid benzodiazepines within 8 hours of opioid.   Orders:  Orders Placed This Encounter  Procedures  . LUMBAR FACET(MEDIAL BRANCH NERVE BLOCK) MBNB    Standing Status:   Standing    Number of Occurrences:   5    Standing Expiration Date:   02/06/2020    Scheduling Instructions:     Purpose: Diagnostic     Indication: Axial low back pain. Lumbosacral Spondylosis (M47.897).      Side: Bilateral     Level: L3-4, L4-5, & L5-S1 Facets (L2, L3, L4, L5, & S1 Medial Branch Nerves)     Sedation: With Sedation.     TIMEFRAME: PRN procedure. (Ms. Landowski will call when needed.)    Order Specific Question:   Where will this procedure be performed?    Answer:   [facility]   Follow-up plan:   Return in about 13 weeks (around 05/08/2019) for (VV), E/M (MM), in addition, PRN Procedure: (B) L-FCT Blk #2 (w/ sedation).     Considering: Diagnostic bilateral lumbar facet nerve block Possible bilateral lumbar facetRFA Diagnosticbilateralsacroiliac joint injection Possiblebilateralsacroiliac joint RFA Diagnostic bilateral L5  TFESI Diagnostic left-sided L2 TFESI   PRN Procedures: None    Recent Visits Date Type Provider Dept  01/17/19 Procedure visit Milinda Pointer, MD [facility]  01/07/19 Office Visit Milinda Pointer, MD [facility]  12/12/18 Office Visit Vevelyn Francois, NP [facility]  11/14/18 Office Visit Milinda Pointer, MD [facility]  Showing recent visits within past 90 days and meeting all other requirements   Today's Visits Date Type Provider Dept  02/06/19 Office Visit Milinda Pointer, MD [facility]  Showing today's visits and meeting all other requirements   Future Appointments No visits were found meeting these conditions.  Showing future appointments within next 90 days and meeting all other requirements   I discussed the assessment and treatment plan with the patient. The patient was provided an opportunity to ask questions and all were answered. The patient agreed with the plan and demonstrated an understanding of the instructions.  Patient advised to call back or seek an in-person evaluation if the symptoms or condition worsens.  Total duration of non-face-to-face encounter: 15 minutes.  Note by: [name], MD Date: 02/06/2019; Time: 9:31 AM  Note: This dictation was prepared with Dragon dictation. Any transcriptional errors that may result from this process are unintentional.  Disclaimer:  * Given the special circumstances of the COVID-19 pandemic, the federal government has announced that the Office for Civil Rights (OCR) will exercise its enforcement discretion and will not impose penalties on physicians using telehealth in the event of noncompliance with regulatory requirements under the [place] and [place] (HIPAA) in connection with the good faith provision of telehealth during the VZCHY-85 national public health emergency. ([place])

## 2019-02-05 NOTE — Patient Instructions (Addendum)
____________________________________________________________________________________________  Preparing for Procedure with Sedation  Procedure appointments are limited to planned procedures: . No Prescription Refills. . No disability issues will be discussed. . No medication changes will be discussed.  Instructions: . Oral Intake: Do not eat or drink anything for at least 8 hours prior to your procedure. . Transportation: Public transportation is not allowed. Bring an adult driver. The driver must be physically present in our waiting room before any procedure can be started. . Physical Assistance: Bring an adult physically capable of assisting you, in the event you need help. This adult should keep you company at home for at least 6 hours after the procedure. . Blood Pressure Medicine: Take your blood pressure medicine with a sip of water the morning of the procedure. . Blood thinners: Notify our staff if you are taking any blood thinners. Depending on which one you take, there will be specific instructions on how and when to stop it. . Diabetics on insulin: Notify the staff so that you can be scheduled 1st case in the morning. If your diabetes requires high dose insulin, take only  of your normal insulin dose the morning of the procedure and notify the staff that you have done so. . Preventing infections: Shower with an antibacterial soap the morning of your procedure. . Build-up your immune system: Take 1000 mg of Vitamin C with every meal (3 times a day) the day prior to your procedure. . Antibiotics: Inform the staff if you have a condition or reason that requires you to take antibiotics before dental procedures. . Pregnancy: If you are pregnant, call and cancel the procedure. . Sickness: If you have a cold, fever, or any active infections, call and cancel the procedure. . Arrival: You must be in the facility at least 30 minutes prior to your scheduled procedure. . Children: Do not bring  children with you. . Dress appropriately: Bring dark clothing that you would not mind if they get stained. . Valuables: Do not bring any jewelry or valuables.  Reasons to call and reschedule or cancel your procedure: (Following these recommendations will minimize the risk of a serious complication.) . Surgeries: Avoid having procedures within 2 weeks of any surgery. (Avoid for 2 weeks before or after any surgery). . Flu Shots: Avoid having procedures within 2 weeks of a flu shots or . (Avoid for 2 weeks before or after immunizations). . Barium: Avoid having a procedure within 7-10 days after having had a radiological study involving the use of radiological contrast. (Myelograms, Barium swallow or enema study). . Heart attacks: Avoid any elective procedures or surgeries for the initial 6 months after a "Myocardial Infarction" (Heart Attack). . Blood thinners: It is imperative that you stop these medications before procedures. Let us know if you if you take any blood thinner.  . Infection: Avoid procedures during or within two weeks of an infection (including chest colds or gastrointestinal problems). Symptoms associated with infections include: Localized redness, fever, chills, night sweats or profuse sweating, burning sensation when voiding, cough, congestion, stuffiness, runny nose, sore throat, diarrhea, nausea, vomiting, cold or Flu symptoms, recent or current infections. It is specially important if the infection is over the area that we intend to treat. . Heart and lung problems: Symptoms that may suggest an active cardiopulmonary problem include: cough, chest pain, breathing difficulties or shortness of breath, dizziness, ankle swelling, uncontrolled high or unusually low blood pressure, and/or palpitations. If you are experiencing any of these symptoms, cancel your procedure and contact   your primary care physician for an evaluation.  Remember:  Regular Business hours are:  Monday to Thursday  8:00 AM to 4:00 PM  Provider's Schedule: Tarron Krolak, MD:  Procedure days: Tuesday and Thursday 7:30 AM to 4:00 PM  Bilal Lateef, MD:  Procedure days: Monday and Wednesday 7:30 AM to 4:00 PM ____________________________________________________________________________________________   ____________________________________________________________________________________________  Medication Rules  Purpose: To inform patients, and their family members, of our rules and regulations.  Applies to: All patients receiving prescriptions (written or electronic).  Pharmacy of record: Pharmacy where electronic prescriptions will be sent. If written prescriptions are taken to a different pharmacy, please inform the nursing staff. The pharmacy listed in the electronic medical record should be the one where you would like electronic prescriptions to be sent.  Electronic prescriptions: In compliance with the [place] Strengthen Opioid Misuse Prevention (STOP) Act of 2017 (Session Law 2017-74/H243), effective August 01, 2018, all controlled substances must be electronically prescribed. Calling prescriptions to the pharmacy will cease to exist.  Prescription refills: Only during scheduled appointments. Applies to all prescriptions.  NOTE: The following applies primarily to controlled substances (Opioid* Pain Medications).   Patient's responsibilities: 1. Pain Pills: Bring all pain pills to every appointment (except for procedure appointments). 2. Pill Bottles: Bring pills in original pharmacy bottle. Always bring the newest bottle. Bring bottle, even if empty. 3. Medication refills: You are responsible for knowing and keeping track of what medications you take and those you need refilled. The day before your appointment: write a list of all prescriptions that need to be refilled. The day of the appointment: give the list to the admitting nurse. Prescriptions will be written only during  appointments. No prescriptions will be written on procedure days. If you forget a medication: it will not be "Called in", "Faxed", or "electronically sent". You will need to get another appointment to get these prescribed. No early refills. Do not call asking to have your prescription filled early. 4. Prescription Accuracy: You are responsible for carefully inspecting your prescriptions before leaving our office. Have the discharge nurse carefully go over each prescription with you, before taking them home. Make sure that your name is accurately spelled, that your address is correct. Check the name and dose of your medication to make sure it is accurate. Check the number of pills, and the written instructions to make sure they are clear and accurate. Make sure that you are given enough medication to last until your next medication refill appointment. 5. Taking Medication: Take medication as prescribed. When it comes to controlled substances, taking less pills or less frequently than prescribed is permitted and encouraged. Never take more pills than instructed. Never take medication more frequently than prescribed.  6. Inform other Doctors: Always inform, all of your healthcare providers, of all the medications you take. 7. Pain Medication from other Providers: You are not allowed to accept any additional pain medication from any other Doctor or Healthcare provider. There are two exceptions to this rule. (see below) In the event that you require additional pain medication, you are responsible for notifying us, as stated below. 8. Medication Agreement: You are responsible for carefully reading and following our Medication Agreement. This must be signed before receiving any prescriptions from our practice. Safely store a copy of your signed Agreement. Violations to the Agreement will result in no further prescriptions. (Additional copies of our Medication Agreement are available upon request.) 9. Laws, Rules,  & Regulations: All patients are expected to follow all Federal   and State Laws, Statutes, Rules, & Regulations. Ignorance of the Laws does not constitute a valid excuse. The use of any illegal substances is prohibited. 10. Adopted CDC guidelines & recommendations: Target dosing levels will be at or below 60 MME/day. Use of benzodiazepines** is not recommended.  Exceptions: There are only two exceptions to the rule of not receiving pain medications from other Healthcare Providers. 1. Exception #1 (Emergencies): In the event of an emergency (i.e.: accident requiring emergency care), you are allowed to receive additional pain medication. However, you are responsible for: As soon as you are able, call our office [phone], at any time of the day or night, and leave a message stating your name, the date and nature of the emergency, and the name and dose of the medication prescribed. In the event that your call is answered by a member of our staff, make sure to document and save the date, time, and the name of the person that took your information.  2. Exception #2 (Planned Surgery): In the event that you are scheduled by another doctor or dentist to have any type of surgery or procedure, you are allowed (for a period no longer than 30 days), to receive additional pain medication, for the acute post-op pain. However, in this case, you are responsible for picking up a copy of our "Post-op Pain Management for Surgeons" handout, and giving it to your surgeon or dentist. This document is available at our office, and does not require an appointment to obtain it. Simply go to our office during business hours (Monday-Thursday from 8:00 AM to 4:00 PM) (Friday 8:00 AM to 12:00 Noon) or if you have a scheduled appointment with us, prior to your surgery, and ask for it by name. In addition, you will need to provide us with your name, name of your surgeon, type of surgery, and date of procedure or surgery.  *Opioid  medications include: morphine, codeine, oxycodone, oxymorphone, hydrocodone, hydromorphone, meperidine, tramadol, tapentadol, buprenorphine, fentanyl, methadone. **Benzodiazepine medications include: diazepam (Valium), alprazolam (Xanax), clonazepam (Klonopine), lorazepam (Ativan), clorazepate (Tranxene), chlordiazepoxide (Librium), estazolam (Prosom), oxazepam (Serax), temazepam (Restoril), triazolam (Halcion) (Last updated: 09/28/2017) ____________________________________________________________________________________________   ____________________________________________________________________________________________  Medication Recommendations and Reminders  Applies to: All patients receiving prescriptions (written and/or electronic).  Medication Rules & Regulations: These rules and regulations exist for your safety and that of others. They are not flexible and neither are we. Dismissing or ignoring them will be considered "non-compliance" with medication therapy, resulting in complete and irreversible termination of such therapy. (See document titled "Medication Rules" for more details.) In all conscience, because of safety reasons, we cannot continue providing a therapy where the patient does not follow instructions.  Pharmacy of record:   Definition: This is the pharmacy where your electronic prescriptions will be sent.   We do not endorse any particular pharmacy.  You are not restricted in your choice of pharmacy.  The pharmacy listed in the electronic medical record should be the one where you want electronic prescriptions to be sent.  If you choose to change pharmacy, simply notify our nursing staff of your choice of new pharmacy.  Recommendations:  Keep all of your pain medications in a safe place, under lock and key, even if you live alone.   After you fill your prescription, take 1 week's worth of pills and put them away in a safe place. You should keep a separate,  properly labeled bottle for this purpose. The remainder should be kept in the original bottle. Use   this as your primary supply, until it runs out. Once it's gone, then you know that you have 1 week's worth of medicine, and it is time to come in for a prescription refill. If you do this correctly, it is unlikely that you will ever run out of medicine.  To make sure that the above recommendation works, it is very important that you make sure your medication refill appointments are scheduled at least 1 week before you run out of medicine. To do this in an effective manner, make sure that you do not leave the office without scheduling your next medication management appointment. Always ask the nursing staff to show you in your prescription , when your medication will be running out. Then arrange for the receptionist to get you a return appointment, at least 7 days before you run out of medicine. Do not wait until you have 1 or 2 pills left, to come in. This is very poor planning and does not take into consideration that we may need to cancel appointments due to bad weather, sickness, or emergencies affecting our staff.  "Partial Fill": If for any reason your pharmacy does not have enough pills/tablets to completely fill or refill your prescription, do not allow for a "partial fill". You will need a separate prescription to fill the remaining amount, which we will not provide. If the reason for the partial fill is your insurance, you will need to talk to the pharmacist about payment alternatives for the remaining tablets, but again, do not accept a partial fill.  Prescription refills and/or changes in medication(s):   Prescription refills, and/or changes in dose or medication, will be conducted only during scheduled medication management appointments. (Applies to both, written and electronic prescriptions.)  No refills on procedure days. No medication will be changed or started on procedure days. No changes,  adjustments, and/or refills will be conducted on a procedure day. Doing so will interfere with the diagnostic portion of the procedure.  No phone refills. No medications will be "called into the pharmacy".  No Fax refills.  No weekend refills.  No Holliday refills.  No after hours refills.  Remember:  Business hours are:  Monday to Thursday 8:00 AM to 4:00 PM Provider's Schedule: Crystal King, NP - Appointments are:  Medication management: Monday to Thursday 8:00 AM to 4:00 PM Mackenzy Eisenberg, MD - Appointments are:  Medication management: Monday and Wednesday 8:00 AM to 4:00 PM Procedure day: Tuesday and Thursday 7:30 AM to 4:00 PM Bilal Lateef, MD - Appointments are:  Medication management: Tuesday and Thursday 8:00 AM to 4:00 PM Procedure day: Monday and Wednesday 7:30 AM to 4:00 PM (Last update: 09/28/2017) ____________________________________________________________________________________________   ____________________________________________________________________________________________  CANNABIDIOL (AKA: CBD Oil or Pills)  Applies to: All patients receiving prescriptions of controlled substances (written and/or electronic).  General Information: Cannabidiol (CBD) was discovered in 1940. It is one of some 113 identified cannabinoids in cannabis (Marijuana) plants, accounting for up to 40% of the plant's extract. As of 2018, preliminary clinical research on cannabidiol included studies of anxiety, cognition, movement disorders, and pain.  Cannabidiol is consummed in multiple ways, including inhalation of cannabis smoke or vapor, as an aerosol spray into the cheek, and by mouth. It may be supplied as CBD oil containing CBD as the active ingredient (no added tetrahydrocannabinol (THC) or terpenes), a full-plant CBD-dominant hemp extract oil, capsules, dried cannabis, or as a liquid solution. CBD is thought not have the same psychoactivity as THC, and may affect the   actions  of THC. Studies suggest that CBD may interact with different biological targets, including cannabinoid receptors and other neurotransmitter receptors. As of 2018 the mechanism of action for its biological effects has not been determined.  In the [place], cannabidiol has a limited approval by the Food and Drug Administration (FDA) for treatment of only two types of epilepsy disorders. The side effects of long-term use of the drug include somnolence, decreased appetite, diarrhea, fatigue, malaise, weakness, sleeping problems, and others.  CBD remains a Schedule I drug prohibited for any use.  Legality: Some manufacturers ship CBD products nationally, an illegal action which the FDA has not enforced in 2018, with CBD remaining the subject of an FDA investigational new drug evaluation, and is not considered legal as a dietary supplement or food ingredient as of December 2018. Federal illegality has made it difficult historically to conduct research on CBD. CBD is openly sold in head shops and health food stores in some states where such sales have not been explicitly legalized.  Warning: Because it is not FDA approved for general use or treatment of pain, it is not required to undergo the same manufacturing controls as prescription drugs.  This means that the available cannabidiol (CBD) may be contaminated with THC.  If this is the case, it will trigger a positive urine drug screen (UDS) test for cannabinoids (Marijuana).  Because a positive UDS for illicit substances is a violation of our medication agreement, your opioid analgesics (pain medicine) may be permanently discontinued. (Last update: 10/19/2017) ____________________________________________________________________________________________    

## 2019-02-06 ENCOUNTER — Ambulatory Visit: Payer: No Typology Code available for payment source | Attending: Pain Medicine | Admitting: Pain Medicine

## 2019-02-06 DIAGNOSIS — M47816 Spondylosis without myelopathy or radiculopathy, lumbar region: Secondary | ICD-10-CM

## 2019-02-06 DIAGNOSIS — M545 Low back pain, unspecified: Secondary | ICD-10-CM

## 2019-02-06 DIAGNOSIS — M7918 Myalgia, other site: Secondary | ICD-10-CM

## 2019-02-06 DIAGNOSIS — G894 Chronic pain syndrome: Secondary | ICD-10-CM

## 2019-02-06 DIAGNOSIS — M533 Sacrococcygeal disorders, not elsewhere classified: Secondary | ICD-10-CM | POA: Diagnosis not present

## 2019-02-06 DIAGNOSIS — G8929 Other chronic pain: Secondary | ICD-10-CM

## 2019-02-06 MED ORDER — BACLOFEN 10 MG PO TABS: 10.0000 mg | ORAL_TABLET | Freq: Every day | ORAL | 2 refills | Status: DC

## 2019-02-06 MED ORDER — HYDROCODONE-ACETAMINOPHEN 7.5-325 MG PO TABS: 1.0000 | ORAL_TABLET | Freq: Three times a day (TID) | ORAL | 0 refills | Status: DC | PRN

## 2019-02-06 MED FILL — BACLOFEN 10 MG TABS: 10 | 30 days supply | Qty: 30 | Fill #0

## 2019-02-16 ENCOUNTER — Other Ambulatory Visit: Payer: Self-pay | Admitting: Pain Medicine

## 2019-02-16 DIAGNOSIS — M47816 Spondylosis without myelopathy or radiculopathy, lumbar region: Secondary | ICD-10-CM

## 2019-02-16 DIAGNOSIS — M461 Sacroiliitis, not elsewhere classified: Secondary | ICD-10-CM

## 2019-02-16 MED FILL — HYDROCODON-APAP 7.5-325: 7.5-325 | 30 days supply | Qty: 90 | Fill #0

## 2019-03-07 ENCOUNTER — Telehealth: Payer: Self-pay | Admitting: Pain Medicine

## 2019-03-07 NOTE — Telephone Encounter (Signed)
Please send

## 2019-03-07 NOTE — Telephone Encounter (Signed)
Patient just had mm appt July, meloxicam did not get sent in for refill

## 2019-03-12 MED FILL — MEDROXYPROGESTERONE 10 MG T: 10 | 10 days supply | Qty: 10 | Fill #0

## 2019-03-18 ENCOUNTER — Other Ambulatory Visit: Payer: Self-pay | Admitting: Pain Medicine

## 2019-03-18 ENCOUNTER — Telehealth: Payer: Self-pay | Admitting: Pain Medicine

## 2019-03-18 DIAGNOSIS — M461 Sacroiliitis, not elsewhere classified: Secondary | ICD-10-CM

## 2019-03-18 DIAGNOSIS — M47816 Spondylosis without myelopathy or radiculopathy, lumbar region: Secondary | ICD-10-CM

## 2019-03-18 MED FILL — HYDROCODON-APAP 7.5-325: 7.5-325 | 30 days supply | Qty: 90 | Fill #0

## 2019-03-18 NOTE — Telephone Encounter (Signed)
Pharmacy called stating they do not have a script for Hydrocodone to fill for August. Patient is due to fill today but they are only showing a script to be filled for Sept. Please Resend script for August asap so they can get meds for patient.

## 2019-03-18 NOTE — Telephone Encounter (Signed)
Called pharm and they will look for order for this prescription. If not may need to send another.

## 2019-03-18 NOTE — Telephone Encounter (Signed)
Reviewed chart and noted that there was a prescription in computer. Called pharm to notify.

## 2019-04-09 ENCOUNTER — Other Ambulatory Visit: Payer: Self-pay | Admitting: Pain Medicine

## 2019-04-09 ENCOUNTER — Telehealth: Payer: Self-pay | Admitting: Internal Medicine

## 2019-04-09 ENCOUNTER — Other Ambulatory Visit: Payer: Self-pay | Admitting: Internal Medicine

## 2019-04-09 DIAGNOSIS — M461 Sacroiliitis, not elsewhere classified: Secondary | ICD-10-CM

## 2019-04-09 DIAGNOSIS — M47816 Spondylosis without myelopathy or radiculopathy, lumbar region: Secondary | ICD-10-CM

## 2019-04-09 MED ORDER — FLUDROCORTISONE ACETATE 0.1 MG PO TABS: 0.0500 mg | ORAL_TABLET | Freq: Every day | ORAL | 2 refills | Status: DC

## 2019-04-09 MED FILL — FLUDROCORTISONE 0.1 MG TAB: 0.1 | 90 days supply | Qty: 45 | Fill #0

## 2019-04-09 MED FILL — HYDROCORTISONE 10 MG TABLET: 10 | 90 days supply | Qty: 180 | Fill #0

## 2019-04-09 NOTE — Telephone Encounter (Signed)
Ok to fill?  °Please advise °

## 2019-04-09 NOTE — Telephone Encounter (Signed)
Patient requests new RX for the following:  MEDICATION: fludrocortisone (FLORINEF) 0.1 MG tablet  PHARMACY:   [facility], [place] [phone] (Phone) [phone] (Fax)    IS THIS A 90 DAY SUPPLY : yes  IS PATIENT OUT OF MEDICATION: Yes  IF NOT; HOW MUCH IS LEFT: 0  LAST APPOINTMENT DATE: @7 /01/2019  NEXT APPOINTMENT DATE:@1 /07/2020  DO WE HAVE YOUR PERMISSION TO LEAVE A DETAILED MESSAGE: yes  OTHER COMMENTS:    **Let patient know to contact pharmacy at the end of the day to make sure medication is ready. **  ** Please notify patient to allow 48-72 hours to process**  **Encourage patient to contact the pharmacy for refills or they can request refills through [facility]**

## 2019-04-17 MED FILL — HYDROCODON-APAP 7.5-325: 7.5-325 | 30 days supply | Qty: 90 | Fill #0

## 2019-04-18 MED FILL — BACLOFEN 10 MG TABS: 10 | 30 days supply | Qty: 30 | Fill #1

## 2019-05-01 ENCOUNTER — Encounter: Payer: Self-pay | Admitting: Family Medicine

## 2019-05-01 ENCOUNTER — Other Ambulatory Visit: Payer: Self-pay

## 2019-05-01 ENCOUNTER — Ambulatory Visit: Payer: Managed Care, Other (non HMO) | Admitting: Family Medicine

## 2019-05-01 VITALS — BP 122/82 | HR 76 | Temp 97.7°F | Ht 64.0 in | Wt 185.8 lb

## 2019-05-01 DIAGNOSIS — L02213 Cutaneous abscess of chest wall: Secondary | ICD-10-CM

## 2019-05-01 DIAGNOSIS — L247 Irritant contact dermatitis due to plants, except food: Secondary | ICD-10-CM | POA: Diagnosis not present

## 2019-05-01 MED ORDER — CEPHALEXIN 500 MG PO CAPS: 500.0000 mg | ORAL_CAPSULE | Freq: Three times a day (TID) | ORAL | 0 refills | Status: AC

## 2019-05-01 NOTE — Progress Notes (Signed)
Subjective:    Patient ID: Denise Burns, female    DOB: 28-Sep-1970, 48 y.o.   MRN: [id number]  HPI Chief Complaint  Patient presents with  . Cyst    Cyst on chest, inflamed x 3 days. Warm to the touch and tender.     This is a 48 yo female who presents today with the above cc.  She noticed swelling, redness and tenderness on the right side of her chest.  She reports that she has had inflamed cysts in this area in the past.  The last time this occurred she lanced and expressed it herself.  She denies any drainage, fever/chills, body aches.  She has had a rash on her bilateral legs for about 5 to 7 days, following doing yard work.  She reports this is similar to her previous poison ivy rash.  She has been applying steroid cream and it is starting to dry up.  She recently required a course of prednisone for poison ivy on her face.  This resolved with treatment. Past Medical History:  Diagnosis Date  . Addison disease ([place])   . Arthritis   . Back pain   . GERD (gastroesophageal reflux disease)    Past Surgical History:  Procedure Laterality Date  . CHOLECYSTECTOMY    . POLYPECTOMY     Family History  Problem Relation Age of Onset  . Arthritis Mother   . Hyperlipidemia Mother   . Hypertension Mother   . Alcohol abuse Father   . Hearing loss Father   . Hyperlipidemia Father    Social History   Tobacco Use  . Smoking status: Former Research scientist (life sciences)  . Smokeless tobacco: Never Used  Substance Use Topics  . Alcohol use: Yes    Comment: occ  . Drug use: Never      Review of Systems Per HPI    Objective:   Physical Exam Vitals signs reviewed.  Constitutional:      General: She is not in acute distress.    Appearance: Normal appearance. She is obese. She is not ill-appearing, toxic-appearing or diaphoretic.  Eyes:     Conjunctiva/sclera: Conjunctivae normal.  Cardiovascular:     Rate and Rhythm: Normal rate.  Pulmonary:     Effort: Pulmonary effort is normal.  Chest:     Skin:    General: Skin is warm and dry.     Findings: Rash: bilateral legs with scattered, healing, linear papules.  Neurological:     Mental Status: She is alert and oriented to person, place, and time.  Psychiatric:        Mood and Affect: Mood normal.        Behavior: Behavior normal.        Thought Content: Thought content normal.        Judgment: Judgment normal.       BP 122/82 (BP Location: Left Arm, Patient Position: Sitting, Cuff Size: Normal)   Pulse 76   Temp 97.7 F (36.5 C) (Temporal)   Ht 5\' 4"  (1.626 m)   Wt 185 lb 12.8 oz (84.3 kg)   LMP 06/08/2018 (Exact Date)   SpO2 98%   BMI 31.89 kg/m  Wt Readings from Last 3 Encounters:  05/01/19 185 lb 12.8 oz (84.3 kg)  02/05/19 182 lb 12.8 oz (82.9 kg)  01/17/19 182 lb (82.6 kg)       Assessment & Plan:  1. Chest wall abscess -Discussed with patient and since this is recurrent, will have her see dermatology -It  is nonfluctuant so decision made not to drain today but will give her a course of antibiotics - Ambulatory referral to Dermatology - cephALEXin (KEFLEX) 500 MG capsule; Take 1 capsule (500 mg total) by mouth 3 (three) times daily for 7 days.  Dispense: 21 capsule; Refill: 0  2.  Contact dermatitis, nonfood plant -She will continue to use steroid cream and if worsening she will let me know.  Clarene Reamer, FNP-BC   Primary [facility] at [facility]  05/02/2019 9:07 AM

## 2019-05-01 NOTE — Patient Instructions (Signed)
Good to see you today  Use warm compresses 3-4 times a day  I have put in referral to dermatology  If poison ivy gets worse, shoot me a my chart message. Continue antihistamine and twice daily steroid cream.

## 2019-05-01 NOTE — Progress Notes (Signed)
Subjective:    Patient ID: Denise Burns, female    DOB: 1971-03-22, 48 y.o.   MRN: [id number] Chief Complaint  Patient presents with  . Cyst    Cyst on chest, inflamed x 3 days. Warm to the touch and tender.     HPI This is 48 yo caucasian female who presents to the office with chest abscess complaint. Pt reports she had the same issue about 3 years ago at the same spot that she was able to open it and drain it herself. The abscess is about 2 cm in diameter, erythematous, tender, warm to touch and firm. Pt reports pain 3/10 with palpation and movement. Pt denies any applied interventions. Pt also reports the presence of poison ivy bilaterally on her lower extremities. She worked in her garden 2 weeks ago and took precautions against poison ive. She suspects that her cats transfer poison ivy on her. Pt applies steroid cream from previous encounter with moderate relief.  Years tiny bump, since Sunday red, warm hurts 3/10 , neg fever, Poison ivy bilatral legs work on the yard. Noticed about a week ago and possible cats bring steroid cream from previous visit progressing   Review of Systems  Constitutional: Negative for activity change, fatigue and fever.  HENT: Negative for congestion and ear pain.   Eyes: Negative for pain and itching.  Respiratory: Negative for cough, chest tightness, shortness of breath and wheezing.   Cardiovascular: Negative for chest pain, palpitations and leg swelling.  Gastrointestinal: Negative for abdominal distention and abdominal pain.  Genitourinary: Negative for difficulty urinating.  Musculoskeletal: Negative.   Skin: Positive for rash.       Bilateral lower extremities with contact dermatitis  Chest abcess  Allergic/Immunologic: Positive for environmental allergies.  Neurological: Negative for dizziness, syncope, weakness, light-headedness, numbness and headaches.       Objective:   Physical Exam Vitals signs and nursing note reviewed.   Constitutional:      General: She is not in acute distress.    Appearance: Normal appearance. She is not ill-appearing.  HENT:     Head: Normocephalic and atraumatic.     Nose: Nose normal. No congestion or rhinorrhea.     Mouth/Throat:     Mouth: Mucous membranes are moist.     Pharynx: Oropharynx is clear.  Neck:     Musculoskeletal: Normal range of motion.  Cardiovascular:     Rate and Rhythm: Normal rate and regular rhythm.     Pulses: Normal pulses.     Heart sounds: Normal heart sounds.  Pulmonary:     Effort: Pulmonary effort is normal.     Breath sounds: Normal breath sounds.  Abdominal:     General: Bowel sounds are normal.     Palpations: Abdomen is soft.  Musculoskeletal: Normal range of motion.  Skin:    General: Skin is warm and dry.  Neurological:     General: No focal deficit present.     Mental Status: She is alert and oriented to person, place, and time. Mental status is at baseline.  Psychiatric:        Mood and Affect: Mood normal.        Thought Content: Thought content normal.        Judgment: Judgment normal.    BP 122/82 (BP Location: Left Arm, Patient Position: Sitting, Cuff Size: Normal)   Pulse 76   Temp 97.7 F (36.5 C) (Temporal)   Ht 5\' 4"  (1.626 m)   Wt 84.3  kg   LMP 06/08/2018 (Exact Date)   SpO2 98%   BMI 31.89 kg/m        Assessment & Plan:  1. Chest wall abscess - Ambulatory referral to Dermatology - cephALEXin (KEFLEX) 500 MG capsule; Take 1 capsule (500 mg total) by mouth 3 (three) times daily for 7 days.  Dispense: 21 capsule; Refill: 0

## 2019-05-07 ENCOUNTER — Encounter: Payer: Self-pay | Admitting: Family Medicine

## 2019-05-08 ENCOUNTER — Other Ambulatory Visit: Payer: Self-pay

## 2019-05-08 ENCOUNTER — Encounter: Payer: Self-pay | Admitting: Pain Medicine

## 2019-05-08 ENCOUNTER — Other Ambulatory Visit: Payer: Self-pay | Admitting: Family Medicine

## 2019-05-08 ENCOUNTER — Ambulatory Visit: Payer: Managed Care, Other (non HMO) | Attending: Pain Medicine | Admitting: Pain Medicine

## 2019-05-08 DIAGNOSIS — L02213 Cutaneous abscess of chest wall: Secondary | ICD-10-CM

## 2019-05-08 DIAGNOSIS — M47816 Spondylosis without myelopathy or radiculopathy, lumbar region: Secondary | ICD-10-CM

## 2019-05-08 DIAGNOSIS — M545 Low back pain, unspecified: Secondary | ICD-10-CM

## 2019-05-08 DIAGNOSIS — M533 Sacrococcygeal disorders, not elsewhere classified: Secondary | ICD-10-CM | POA: Diagnosis not present

## 2019-05-08 DIAGNOSIS — G8929 Other chronic pain: Secondary | ICD-10-CM

## 2019-05-08 DIAGNOSIS — M7918 Myalgia, other site: Secondary | ICD-10-CM

## 2019-05-08 DIAGNOSIS — G894 Chronic pain syndrome: Secondary | ICD-10-CM

## 2019-05-08 DIAGNOSIS — M461 Sacroiliitis, not elsewhere classified: Secondary | ICD-10-CM

## 2019-05-08 MED ORDER — HYDROCODONE-ACETAMINOPHEN 7.5-325 MG PO TABS: 1.0000 | ORAL_TABLET | Freq: Three times a day (TID) | ORAL | 0 refills | Status: DC | PRN

## 2019-05-08 MED ORDER — BACLOFEN 10 MG PO TABS: 10.0000 mg | ORAL_TABLET | Freq: Every day | ORAL | 2 refills | Status: DC

## 2019-05-08 MED ORDER — MELOXICAM 15 MG PO TABS: 15.0000 mg | ORAL_TABLET | Freq: Every day | ORAL | 0 refills | Status: DC

## 2019-05-08 MED FILL — MELOXICAM 15 MG TABLET: 15 | 90 days supply | Qty: 90 | Fill #0

## 2019-05-08 NOTE — Patient Instructions (Signed)

## 2019-05-08 NOTE — Progress Notes (Addendum)
Pain Management Virtual Encounter Note - Virtual Visit via Telephone Telehealth (real-time audio visits between healthcare provider and patient).   Patient's Phone No. & Preferred Pharmacy:  [phone] (home); [phone] (mobile); (Preferred) [phone] [email or web address]  [place], [address] [place] [place] [address] Phone: [phone] Fax: [phone]    Pre-screening note:  Our staff contacted Ms. Eckels and offered her an "in person", "face-to-face" appointment versus a telephone encounter. She indicated preferring the telephone encounter, at this time.   Reason for Virtual Visit: COVID-19*  Social distancing based on CDC and AMA recommendations.   I contacted Braulio Conte on 05/08/2019 via telephone.      I clearly identified myself as [name], MD. I verified that I was speaking with the correct person using two identifiers (Name: Ivis Lawwill, and date of birth: 10/16/1970).  Advanced Informed Consent I sought verbal advanced consent from Braulio Conte for virtual visit interactions. I informed Ms. Venezia of possible security and privacy concerns, risks, and limitations associated with providing "not-in-person" medical evaluation and management services. I also informed Ms. Demedeiros of the availability of "in-person" appointments. Finally, I informed her that there would be a charge for the virtual visit and that she could be  personally, fully or partially, financially responsible for it. Ms. Kassebaum expressed understanding and agreed to proceed.   Historic Elements   Ms. Deetya Kautzer is a 48 y.o. year old, female patient evaluated today after her last encounter by our practice on 04/09/2019. Ms. Hatem  has a past medical history of Addison disease ([place]), Arthritis, Back pain, and GERD (gastroesophageal reflux disease). She also  has a past surgical history that includes Cholecystectomy and  Polypectomy. Ms. Grieger has a current medication list which includes the following prescription(s): baclofen, cephalexin, cetirizine, vitamin d3, desonide, fludrocortisone, hydrocodone-acetaminophen, hydrocodone-acetaminophen, hydrocodone-acetaminophen, hydrocortisone, meloxicam, and pseudoephedrine. She  reports that she has quit smoking. She has never used smokeless tobacco. She reports current alcohol use. She reports that she does not use drugs. Ms. Varian is allergic to lisinopril; ultram [tramadol]; and sulfa antibiotics.   HPI  Today, she is being contacted for medication management.  The patient indicates doing well with the current medication regimen. No adverse reactions or side effects reported to the medications. The patient is scheduled to come in next week (05/16/2019) for a bilateral lumbar facet block.  Pharmacotherapy Assessment  Analgesic: Hydrocodone/APAP 7.5/325 1 tablet p.o. 3 times daily (22.5 mg/day of hydrocodone) MME/day:22.5mg /day.   Monitoring: Pharmacotherapy: No side-effects or adverse reactions reported. [place] PMP: PDMP reviewed during this encounter.       Compliance: No problems identified. Effectiveness: Clinically acceptable. Plan: Refer to "POC".  UDS:  Summary  Date Value Ref Range Status  08/16/2018 FINAL  Final    Comment:    ==================================================================== TOXASSURE COMP DRUG ANALYSIS,UR ==================================================================== Test                             Result       Flag       Units Drug Present and Declared for Prescription Verification   Hydrocodone                    205          EXPECTED   ng/mg creat   Hydromorphone  671          EXPECTED   ng/mg creat   Dihydrocodeine                 153          EXPECTED   ng/mg creat   Norhydrocodone                 642          EXPECTED   ng/mg creat    Sources of hydrocodone include scheduled prescription     medications. Hydromorphone, dihydrocodeine and norhydrocodone are    expected metabolites of hydrocodone. Hydromorphone and    dihydrocodeine are also available as scheduled prescription    medications.   Ephedrine/Pseudoephedrine      PRESENT      EXPECTED   Phenylpropanolamine            PRESENT      EXPECTED    Source of ephedrine/pseudoephedrine is most commonly    pseudoephedrine in over-the-counter or prescription cold and    allergy medications. Phenylpropanolamine is an expected    metabolite of ephedrine/pseudoephedrine.   Baclofen                       PRESENT      EXPECTED   Acetaminophen                  PRESENT      EXPECTED Drug Present not Declared for Prescription Verification   Ibuprofen                      PRESENT      UNEXPECTED   Doxylamine                     PRESENT      UNEXPECTED   Dextrorphan/Levorphanol        PRESENT      UNEXPECTED    Dextrorphan is an expected metabolite of dextromethorphan, an    over-the-counter or prescription cough suppressant. Levorphanol    is a scheduled prescription medication. Dextrorphan cannot be    distinguished from levorphanol by the method used for analysis. ==================================================================== Test                      Result    Flag   Units      Ref Range   Creatinine              234              mg/dL      >=20 ==================================================================== Declared Medications:  The flagging and interpretation on this report are based on the  following declared medications.  Unexpected results may arise from  inaccuracies in the declared medications.  **Note: The testing scope of this panel includes these medications:  Baclofen  Hydrocodone (Hydrocodone-Acetaminophen)  Pseudoephedrine (Sudafed)  **Note: The testing scope of this panel does not include small to  moderate amounts of these reported medications:  Acetaminophen (Hydrocodone-Acetaminophen)  **Note: The  testing scope of this panel does not include following  reported medications:  Cholecalciferol  Fexofenadine  Fludrocortisone (Florinef)  Hydrocortisone (Cortef) ==================================================================== For clinical consultation, please call [phone]. ====================================================================    Laboratory Chemistry Profile (12 mo)  Renal: 08/16/2018: BUN/Creatinine Ratio 18 02/05/2019: BUN 19; Creatinine, Ser 0.80  Lab Results  Component Value Date   GFR 76.45 02/05/2019   GFRAA  86 08/16/2018   GFRNONAA 74 08/16/2018   Hepatic: 08/16/2018: Albumin 4.4 Lab Results  Component Value Date   AST 18 08/16/2018   Other: 08/16/2018: 25-Hydroxy, Vitamin D 67; 25-Hydroxy, Vitamin D-2 <1.0; 25-Hydroxy, Vitamin D-3 67; CRP 2; Sed Rate 37; Vitamin B-12 932 Note: Above Lab results reviewed.  Imaging  Last 90 days:  No results found.  Assessment  The primary encounter diagnosis was Chronic pain syndrome. Diagnoses of Chronic low back pain (Primary Area of Pain) (Bilateral) (R>L) w/o sciatica, Chronic sacroiliac joint pain (Secondary Area of Pain) (Right), Chronic musculoskeletal pain, Osteoarthritis of sacroiliac joint (Bilateral), and Osteoarthritis of facet joint of lumbar spine were also pertinent to this visit.  Plan of Care  I have changed Najla Raucci's meloxicam. I am also having her start on HYDROcodone-acetaminophen and HYDROcodone-acetaminophen. Additionally, I am having her maintain her Vitamin D3, pseudoephedrine, cetirizine, desonide, hydrocortisone, fludrocortisone, cephALEXin, baclofen, and HYDROcodone-acetaminophen.  Pharmacotherapy (Medications Ordered): Meds ordered this encounter  Medications  . baclofen (LIORESAL) 10 MG tablet    Sig: Take 1 tablet (10 mg total) by mouth daily.    Dispense:  30 tablet    Refill:  2    Fill one day early if pharmacy is closed on scheduled refill date. May substitute for generic  if available.  Marland Kitchen HYDROcodone-acetaminophen (NORCO) 7.5-325 MG tablet    Sig: Take 1 tablet by mouth every 8 (eight) hours as needed for severe pain. Must last 30 days.    Dispense:  90 tablet    Refill:  0    Chronic Pain: STOP Act (Not applicable) Fill 1 day early if closed on refill date. Do not fill until: 05/17/2019. To last until: 06/16/2019. Avoid benzodiazepines within 8 hours of opioids  . meloxicam (MOBIC) 15 MG tablet    Sig: Take 1 tablet (15 mg total) by mouth daily.    Dispense:  100 tablet    Refill:  0    Fill one day early if pharmacy is closed on scheduled refill date. May substitute for generic if available.  Marland Kitchen HYDROcodone-acetaminophen (NORCO) 7.5-325 MG tablet    Sig: Take 1 tablet by mouth every 8 (eight) hours as needed for severe pain. Must last 30 days.    Dispense:  90 tablet    Refill:  0    Chronic Pain: STOP Act (Not applicable) Fill 1 day early if closed on refill date. Do not fill until: 06/16/2019. To last until: 07/16/2019. Avoid benzodiazepines within 8 hours of opioids  . HYDROcodone-acetaminophen (NORCO) 7.5-325 MG tablet    Sig: Take 1 tablet by mouth every 8 (eight) hours as needed for severe pain. Must last 30 days.    Dispense:  90 tablet    Refill:  0    Chronic Pain: STOP Act (Not applicable) Fill 1 day early if closed on refill date. Do not fill until: 07/16/2019. To last until: 08/15/2019. Avoid benzodiazepines within 8 hours of opioids   Orders:  No orders of the defined types were placed in this encounter.  Follow-up plan:   Return for Procedure (w/ sedation): (B) L-FCT BLK #2 on 05/16/2019.  The patient is already scheduled to come in on 05/16/2019 for a facet block #2 under fluoroscopic guidance and IV sedation.  Company did not approve frequency since we are not injections.  We will plan on doing the left side 2 weeks later.     Considering: Therapeutic bilateral lumbar facetRFA #1(starting with the right  side) Diagnosticbilateralsacroiliac joint injection Possiblebilateralsacroiliac  joint RFA Diagnostic bilateral L5 TFESI Diagnostic left-sided L2 TFESI   PRN Procedures: Diagnostic bilateral lumbar facet nerve block #2    Recent Visits No visits were found meeting these conditions.  Showing recent visits within past 90 days and meeting all other requirements   Today's Visits Date Type Provider Dept  05/08/19 Office Visit Milinda Pointer, MD [facility]  Showing today's visits and meeting all other requirements   Future Appointments Date Type Provider Dept  05/16/19 Appointment Milinda Pointer, MD [facility]  Showing future appointments within next 90 days and meeting all other requirements   I discussed the assessment and treatment plan with the patient. The patient was provided an opportunity to ask questions and all were answered. The patient agreed with the plan and demonstrated an understanding of the instructions.  Patient advised to call back or seek an in-person evaluation if the symptoms or condition worsens.  Total duration of non-face-to-face encounter: 13 minutes.  Note by: [name], MD Date: 05/08/2019; Time: 10:20 AM  Note: This dictation was prepared with Dragon dictation. Any transcriptional errors that may result from this process are unintentional.  Disclaimer:  * Given the special circumstances of the COVID-19 pandemic, the federal government has announced that the Office for Civil Rights (OCR) will exercise its enforcement discretion and will not impose penalties on physicians using telehealth in the event of noncompliance with regulatory requirements under the [place] (HIPAA) in connection with the good faith provision of telehealth during the XX123456 national public health emergency. ([place])

## 2019-05-15 NOTE — Patient Instructions (Signed)

## 2019-05-15 NOTE — Progress Notes (Signed)
Patient's Name: Denise Burns  MRN: [id number]  Referring Provider: Milinda Pointer, MD  DOB: [birth year]  PCP: Elby Beck, FNP  DOS: 05/16/2019  Note by: [name], MD  Service setting: Ambulatory outpatient  Specialty: Interventional Pain Management  Patient type: Established  Location: [facility] (AMB) Pain Management Facility  Visit type: Interventional Procedure   Primary Reason for Visit: Interventional Pain Management Treatment. CC: Back Pain  Procedure:          Anesthesia, Analgesia, Anxiolysis:  Type: Lumbar Facet, Medial Branch Block(s) #2  Primary Purpose: Diagnostic Region: Posterolateral Lumbosacral Spine Level: L2, L3, L4, L5, & S1 Medial Branch Level(s). Injecting these levels blocks the L3-4, L4-5, and L5-S1 lumbar facet joints. Laterality: Bilateral  Type: Moderate (Conscious) Sedation combined with Local Anesthesia Indication(s): Analgesia and Anxiety Route: Intravenous (IV) IV Access: Secured Sedation: Meaningful verbal contact was maintained at all times during the procedure  Local Anesthetic: Lidocaine 1-2%  Position: Prone   Indications: 1. Lumbar facet syndrome (Bilateral)   2. Spondylosis without myelopathy or radiculopathy, lumbosacral region   3. Lumbar facet hypertrophy (Multilevel) (Bilateral)   4. L5-S1 pars defect w/ spondylolisthesis (Bilateral)   5. DDD (degenerative disc disease), lumbosacral   6. Chronic low back pain (Primary Area of Pain) (Bilateral) (R>L) w/o sciatica    Pain Score: Pre-procedure: 3 /10 Post-procedure: 0-No pain/10   Pre-op Assessment:  Denise Burns is a 48 y.o. (year old), female patient, seen today for interventional treatment. She  has a past surgical history that includes Cholecystectomy and Polypectomy. Denise Burns has a current medication list which includes the following prescription(s): baclofen, cetirizine, vitamin d3, fludrocortisone, hydrocodone-acetaminophen, hydrocodone-acetaminophen,  hydrocodone-acetaminophen, hydrocortisone, meloxicam, pseudoephedrine, and desonide, and the following Facility-Administered Medications: fentanyl and midazolam. Her primarily concern today is the Back Pain  Initial Vital Signs:  Pulse/HCG Rate: 78ECG Heart Rate: 82 Temp: 98.1 F (36.7 C) Resp: 16 BP: (!) 136/96 SpO2: 99 %  BMI: Estimated body mass index is 31.76 kg/m as calculated from the following:   Height as of this encounter: 5\' 4"  (1.626 m).   Weight as of this encounter: 185 lb (83.9 kg).  Risk Assessment: Allergies: Reviewed. She is allergic to lisinopril; ultram [tramadol]; and sulfa antibiotics.  Allergy Precautions: None required Coagulopathies: Reviewed. None identified.  Blood-thinner therapy: None at this time Active Infection(s): Reviewed. None identified. Denise Burns is afebrile  Site Confirmation: Denise Burns was asked to confirm the procedure and laterality before marking the site Procedure checklist: Completed Consent: Before the procedure and under the influence of no sedative(s), amnesic(s), or anxiolytics, the patient was informed of the treatment options, risks and possible complications. To fulfill our ethical and legal obligations, as recommended by the American Medical Association's Code of Ethics, I have informed the patient of my clinical impression; the nature and purpose of the treatment or procedure; the risks, benefits, and possible complications of the intervention; the alternatives, including doing nothing; the risk(s) and benefit(s) of the alternative treatment(s) or procedure(s); and the risk(s) and benefit(s) of doing nothing. The patient was provided information about the general risks and possible complications associated with the procedure. These may include, but are not limited to: failure to achieve desired goals, infection, bleeding, organ or nerve damage, allergic reactions, paralysis, and death. In addition, the patient was informed of those  risks and complications associated to Spine-related procedures, such as failure to decrease pain; infection (i.e.: Meningitis, epidural or intraspinal abscess); bleeding (i.e.: epidural hematoma, subarachnoid hemorrhage, or any other type of  intraspinal or peri-dural bleeding); organ or nerve damage (i.e.: Any type of peripheral nerve, nerve root, or spinal cord injury) with subsequent damage to sensory, motor, and/or autonomic systems, resulting in permanent pain, numbness, and/or weakness of one or several areas of the body; allergic reactions; (i.e.: anaphylactic reaction); and/or death. Furthermore, the patient was informed of those risks and complications associated with the medications. These include, but are not limited to: allergic reactions (i.e.: anaphylactic or anaphylactoid reaction(s)); adrenal axis suppression; blood sugar elevation that in diabetics may result in ketoacidosis or comma; water retention that in patients with history of congestive heart failure may result in shortness of breath, pulmonary edema, and decompensation with resultant heart failure; weight gain; swelling or edema; medication-induced neural toxicity; particulate matter embolism and blood vessel occlusion with resultant organ, and/or nervous system infarction; and/or aseptic necrosis of one or more joints. Finally, the patient was informed that Medicine is not an exact science; therefore, there is also the possibility of unforeseen or unpredictable risks and/or possible complications that may result in a catastrophic outcome. The patient indicated having understood very clearly. We have given the patient no guarantees and we have made no promises. Enough time was given to the patient to ask questions, all of which were answered to the patient's satisfaction. Denise Burns has indicated that she wanted to continue with the procedure. Attestation: I, the ordering provider, attest that I have discussed with the patient the  benefits, risks, side-effects, alternatives, likelihood of achieving goals, and potential problems during recovery for the procedure that I have provided informed consent. Date   Time: 05/16/2019  7:58 AM  Pre-Procedure Preparation:  Monitoring: As per clinic protocol. Respiration, ETCO2, SpO2, BP, heart rate and rhythm monitor placed and checked for adequate function Safety Precautions: Patient was assessed for positional comfort and pressure points before starting the procedure. Time-out: I initiated and conducted the "Time-out" before starting the procedure, as per protocol. The patient was asked to participate by confirming the accuracy of the "Time Out" information. Verification of the correct person, site, and procedure were performed and confirmed by me, the nursing staff, and the patient. "Time-out" conducted as per Joint Commission's Universal Protocol (UP.01.01.01). Time: 0829  Description of Procedure:          Laterality: Bilateral. The procedure was performed in identical fashion on both sides. Levels:  L2, L3, L4, L5, & S1 Medial Branch Level(s) Area Prepped: Posterior Lumbosacral Region Prepping solution: DuraPrep (Iodine Povacrylex [0.7% available iodine] and Isopropyl Alcohol, 74% w/w) Safety Precautions: Aspiration looking for blood return was conducted prior to all injections. At no point did we inject any substances, as a needle was being advanced. Before injecting, the patient was told to immediately notify me if she was experiencing any new onset of "ringing in the ears, or metallic taste in the mouth". No attempts were made at seeking any paresthesias. Safe injection practices and needle disposal techniques used. Medications properly checked for expiration dates. SDV (single dose vial) medications used. After the completion of the procedure, all disposable equipment used was discarded in the proper designated medical waste containers. Local Anesthesia: Protocol guidelines were  followed. The patient was positioned over the fluoroscopy table. The area was prepped in the usual manner. The time-out was completed. The target area was identified using fluoroscopy. A 12-in long, straight, sterile hemostat was used with fluoroscopic guidance to locate the targets for each level blocked. Once located, the skin was marked with an approved surgical skin marker. Once all  sites were marked, the skin (epidermis, dermis, and hypodermis), as well as deeper tissues (fat, connective tissue and muscle) were infiltrated with a small amount of a short-acting local anesthetic, loaded on a 10cc syringe with a 25G, 1.5-in  Needle. An appropriate amount of time was allowed for local anesthetics to take effect before proceeding to the next step. Local Anesthetic: Lidocaine 2.0% The unused portion of the local anesthetic was discarded in the proper designated containers. Technical explanation of process:  L2 Medial Branch Nerve Block (MBB): The target area for the L2 medial branch is at the junction of the postero-lateral aspect of the superior articular process and the superior, posterior, and medial edge of the transverse process of L3. Under fluoroscopic guidance, a Quincke needle was inserted until contact was made with os over the superior postero-lateral aspect of the pedicular shadow (target area). After negative aspiration for blood, 0.5 mL of the nerve block solution was injected without difficulty or complication. The needle was removed intact. L3 Medial Branch Nerve Block (MBB): The target area for the L3 medial branch is at the junction of the postero-lateral aspect of the superior articular process and the superior, posterior, and medial edge of the transverse process of L4. Under fluoroscopic guidance, a Quincke needle was inserted until contact was made with os over the superior postero-lateral aspect of the pedicular shadow (target area). After negative aspiration for blood, 0.5 mL of the nerve  block solution was injected without difficulty or complication. The needle was removed intact. L4 Medial Branch Nerve Block (MBB): The target area for the L4 medial branch is at the junction of the postero-lateral aspect of the superior articular process and the superior, posterior, and medial edge of the transverse process of L5. Under fluoroscopic guidance, a Quincke needle was inserted until contact was made with os over the superior postero-lateral aspect of the pedicular shadow (target area). After negative aspiration for blood, 0.5 mL of the nerve block solution was injected without difficulty or complication. The needle was removed intact. L5 Medial Branch Nerve Block (MBB): The target area for the L5 medial branch is at the junction of the postero-lateral aspect of the superior articular process and the superior, posterior, and medial edge of the sacral ala. Under fluoroscopic guidance, a Quincke needle was inserted until contact was made with os over the superior postero-lateral aspect of the pedicular shadow (target area). After negative aspiration for blood, 0.5 mL of the nerve block solution was injected without difficulty or complication. The needle was removed intact. S1 Medial Branch Nerve Block (MBB): The target area for the S1 medial branch is at the posterior and inferior 6 o'clock position of the L5-S1 facet joint. Under fluoroscopic guidance, the Quincke needle inserted for the L5 MBB was redirected until contact was made with os over the inferior and postero aspect of the sacrum, at the 6 o' clock position under the L5-S1 facet joint (Target area). After negative aspiration for blood, 0.5 mL of the nerve block solution was injected without difficulty or complication. The needle was removed intact.  Nerve block solution: 0.2% PF-Ropivacaine + Triamcinolone (40 mg/mL) diluted to a final concentration of 4 mg of Triamcinolone/mL of Ropivacaine The unused portion of the solution was discarded  in the proper designated containers. Procedural Needles: 22-gauge, 3.5-inch, Quincke needles used for all levels.  Once the entire procedure was completed, the treated area was cleaned, making sure to leave some of the prepping solution back to take advantage of its long  term bactericidal properties.   Illustration of the posterior view of the lumbar spine and the posterior neural structures. Laminae of L2 through S1 are labeled. DPRL5, dorsal primary ramus of L5; DPRS1, dorsal primary ramus of S1; DPR3, dorsal primary ramus of L3; FJ, facet (zygapophyseal) joint L3-L4; I, inferior articular process of L4; LB1, lateral branch of dorsal primary ramus of L1; IAB, inferior articular branches from L3 medial branch (supplies L4-L5 facet joint); IBP, intermediate branch plexus; MB3, medial branch of dorsal primary ramus of L3; NR3, third lumbar nerve root; S, superior articular process of L5; SAB, superior articular branches from L4 (supplies L4-5 facet joint also); TP3, transverse process of L3.  Vitals:   05/16/19 0839 05/16/19 0849 05/16/19 0859 05/16/19 0909  BP: 123/89 120/80 (!) 112/99 (!) 114/92  Pulse:      Resp: 18 20 19 20   Temp:  (!) 97.3 F (36.3 C)  (!) 97.2 F (36.2 C)  TempSrc:      SpO2: 100% 98% 97% 98%  Weight:      Height:         Start Time: 0829 hrs. End Time: 0839 hrs.  Imaging Guidance (Spinal):          Type of Imaging Technique: Fluoroscopy Guidance (Spinal) Indication(s): Assistance in needle guidance and placement for procedures requiring needle placement in or near specific anatomical locations not easily accessible without such assistance. Exposure Time: Please see nurses notes. Contrast: None used. Fluoroscopic Guidance: I was personally present during the use of fluoroscopy. "Tunnel Vision Technique" used to obtain the best possible view of the target area. Parallax error corrected before commencing the procedure. "Direction-depth-direction" technique used to  introduce the needle under continuous pulsed fluoroscopy. Once target was reached, antero-posterior, oblique, and lateral fluoroscopic projection used confirm needle placement in all planes. Images permanently stored in EMR. Interpretation: No contrast injected. I personally interpreted the imaging intraoperatively. Adequate needle placement confirmed in multiple planes. Permanent images saved into the patient's record.  Antibiotic Prophylaxis:   Anti-infectives (From admission, onward)   None     Indication(s): None identified  Post-operative Assessment:  Post-procedure Vital Signs:  Pulse/HCG Rate: 7895 Temp: (!) 97.2 F (36.2 C) Resp: 20 BP: (!) 114/92 SpO2: 98 %  EBL: None  Complications: No immediate post-treatment complications observed by team, or reported by patient.  Note: The patient tolerated the entire procedure well. A repeat set of vitals were taken after the procedure and the patient was kept under observation following institutional policy, for this type of procedure. Post-procedural neurological assessment was performed, showing return to baseline, prior to discharge. The patient was provided with post-procedure discharge instructions, including a section on how to identify potential problems. Should any problems arise concerning this procedure, the patient was given instructions to immediately contact us, at any time, without hesitation. In any case, we plan to contact the patient by telephone for a follow-up status report regarding this interventional procedure.  Comments:  No additional relevant information.  Plan of Care  Orders:  Orders Placed This Encounter  Procedures   LUMBAR FACET(MEDIAL BRANCH NERVE BLOCK) MBNB    Scheduling Instructions:     Side: Bilateral     Level: L3-4, L4-5, & L5-S1 Facets (L2, L3, L4, L5, & S1 Medial Branch Nerves)     Sedation: With Sedation.     Timeframe: Today    Order Specific Question:   Where will this procedure be  performed?    Answer:   [facility]  DG [facility] C-ARM 1-60 MIN NO REPORT    Intraoperative interpretation by procedural physician at [place].    Standing Status:   Standing    Number of Occurrences:   1    Order Specific Question:   Reason for exam:    Answer:   Assistance in needle guidance and placement for procedures requiring needle placement in or near specific anatomical locations not easily accessible without such assistance.   Informed Consent Details: Physician/Practitioner Attestation; Transcribe to consent form and obtain patient signature    Provider Attestation: I, [place] Dossie Arbour, MD, (Pain Management Specialist), the physician/practitioner, attest that I have discussed with the patient the benefits, risks, side effects, alternatives, likelihood of achieving goals and potential problems during recovery for the procedure that I have provided informed consent.    Scheduling Instructions:     Procedure: Lumbar Facet Block  under fluoroscopic guidance     Indication(s): Low Back Pain, with our without leg pain, due to Facet Joint Arthralgia (Joint Pain) known as Lumbar Facet Syndrome, secondary to Lumbar, and/or Lumbosacral Spondylosis (Arthritis of the Spine), without myelopathy or radiculopathy (Nerve Damage).     Note: Always confirm laterality of pain with [name], before procedure.     Transcribe to consent form and obtain patient signature.   Provide equipment / supplies at bedside    Equipment required: Single use, disposable, "Block Tray"    Standing Status:   Standing    Number of Occurrences:   1    Order Specific Question:   Specify    Answer:   Block Tray   Chronic Opioid Analgesic:  Hydrocodone/APAP 7.5/325 1 tablet p.o. 3 times daily (22.5 mg/day of hydrocodone) MME/day:22.5mg /day.   Medications ordered for procedure: Meds ordered this encounter  Medications   lidocaine (XYLOCAINE) 2 % (with pres) injection 400 mg    lactated ringers infusion 1,000 mL   midazolam (VERSED) 5 MG/5ML injection 1-2 mg    Make sure Flumazenil is available in the pyxis when using this medication. If oversedation occurs, administer 0.2 mg IV over 15 sec. If after 45 sec no response, administer 0.2 mg again over 1 min; may repeat at 1 min intervals; not to exceed 4 doses (1 mg)   fentaNYL (SUBLIMAZE) injection 25-50 mcg    Make sure Narcan is available in the pyxis when using this medication. In the event of respiratory depression (RR< 8/min): Titrate NARCAN (naloxone) in increments of 0.1 to 0.2 mg IV at 2-3 minute intervals, until desired degree of reversal.   ropivacaine (PF) 2 mg/mL (0.2%) (NAROPIN) injection 18 mL   triamcinolone acetonide (KENALOG-40) injection 80 mg   Medications administered: We administered lidocaine, lactated ringers, midazolam, fentaNYL, ropivacaine (PF) 2 mg/mL (0.2%), and triamcinolone acetonide.  See the medical record for exact dosing, route, and time of administration.  Follow-up plan:   Return in about 2 weeks (around 05/30/2019) for (VV), (PP).       Considering: Therapeutic bilateral lumbar facetRFA #1(starting with the right side) Diagnosticbilateralsacroiliac joint injection Possiblebilateralsacroiliac joint RFA Diagnostic bilateral L5 TFESI Diagnostic left-sided L2 TFESI   PRN Procedures: Diagnostic bilateral lumbar facet nerve block #2     Recent Visits Date Type Provider Dept  05/08/19 Office Visit Milinda Pointer, MD [facility]  Showing recent visits within past 90 days and meeting all other requirements   Today's Visits Date Type Provider Dept  05/16/19 Procedure visit Milinda Pointer, MD [facility]  Showing today's visits and meeting  all other requirements   Future Appointments Date Type Provider Dept  06/03/19 Appointment Milinda Pointer, MD [facility]  Showing future appointments within next 90 days and  meeting all other requirements   Disposition: Discharge home  Discharge Date & Time: 05/16/2019; 0910 hrs.   Primary Care Physician: Elby Beck, FNP Location: [facility] Note by: [name], MD Date: 05/16/2019; Time: 9:21 AM  Disclaimer:  Medicine is not an Chief Strategy Officer. The only guarantee in medicine is that nothing is guaranteed. It is important to note that the decision to proceed with this intervention was based on the information collected from the patient. The Data and conclusions were drawn from the patient's questionnaire, the interview, and the physical examination. Because the information was provided in large part by the patient, it cannot be guaranteed that it has not been purposely or unconsciously manipulated. Every effort has been made to obtain as much relevant data as possible for this evaluation. It is important to note that the conclusions that lead to this procedure are derived in large part from the available data. Always take into account that the treatment will also be dependent on availability of resources and existing treatment guidelines, considered by other Pain Management Practitioners as being common knowledge and practice, at the time of the intervention. For Medico-Legal purposes, it is also important to point out that variation in procedural techniques and pharmacological choices are the acceptable norm. The indications, contraindications, technique, and results of the above procedure should only be interpreted and judged by a Board-Certified Interventional Pain Specialist with extensive familiarity and expertise in the same exact procedure and technique.

## 2019-05-16 ENCOUNTER — Ambulatory Visit
Admission: RE | Admit: 2019-05-16 | Discharge: 2019-05-16 | Disposition: A | Discharge status: Home / Self Care | Payer: Managed Care, Other (non HMO) | Source: Ambulatory Visit | Attending: Pain Medicine | Admitting: Pain Medicine

## 2019-05-16 ENCOUNTER — Encounter: Payer: Self-pay | Admitting: General Surgery

## 2019-05-16 ENCOUNTER — Encounter: Payer: Self-pay | Admitting: Pain Medicine

## 2019-05-16 ENCOUNTER — Ambulatory Visit: Payer: Self-pay | Admitting: General Surgery

## 2019-05-16 ENCOUNTER — Ambulatory Visit: Payer: Managed Care, Other (non HMO) | Admitting: General Surgery

## 2019-05-16 ENCOUNTER — Ambulatory Visit (HOSPITAL_BASED_OUTPATIENT_CLINIC_OR_DEPARTMENT_OTHER): Payer: Managed Care, Other (non HMO) | Admitting: Pain Medicine

## 2019-05-16 ENCOUNTER — Other Ambulatory Visit: Payer: Self-pay

## 2019-05-16 VITALS — BP 114/92 | HR 78 | Temp 97.2°F | Resp 20 | Ht 64.0 in | Wt 185.0 lb

## 2019-05-16 VITALS — BP 134/92 | HR 91 | Temp 97.7°F | Ht 64.0 in | Wt 185.8 lb

## 2019-05-16 DIAGNOSIS — M47817 Spondylosis without myelopathy or radiculopathy, lumbosacral region: Secondary | ICD-10-CM | POA: Diagnosis present

## 2019-05-16 DIAGNOSIS — G8929 Other chronic pain: Secondary | ICD-10-CM | POA: Diagnosis present

## 2019-05-16 DIAGNOSIS — M4300 Spondylolysis, site unspecified: Secondary | ICD-10-CM | POA: Diagnosis present

## 2019-05-16 DIAGNOSIS — M5137 Other intervertebral disc degeneration, lumbosacral region: Secondary | ICD-10-CM

## 2019-05-16 DIAGNOSIS — M4310 Spondylolisthesis, site unspecified: Secondary | ICD-10-CM | POA: Insufficient documentation

## 2019-05-16 DIAGNOSIS — L723 Sebaceous cyst: Secondary | ICD-10-CM

## 2019-05-16 DIAGNOSIS — M545 Low back pain: Secondary | ICD-10-CM | POA: Insufficient documentation

## 2019-05-16 DIAGNOSIS — M47816 Spondylosis without myelopathy or radiculopathy, lumbar region: Secondary | ICD-10-CM | POA: Diagnosis not present

## 2019-05-16 MED ORDER — LIDOCAINE HCL 2 % IJ SOLN
20.0000 mL | Freq: Once | INTRAMUSCULAR | Status: AC
  Administered 2019-05-16: 400 mg
  Filled 2019-05-16: qty 40

## 2019-05-16 MED ORDER — ROPIVACAINE HCL 2 MG/ML IJ SOLN
18.0000 mL | Freq: Once | INTRAMUSCULAR | Status: AC
  Administered 2019-05-16: 18 mL via PERINEURAL
  Filled 2019-05-16: qty 20

## 2019-05-16 MED ORDER — FENTANYL CITRATE (PF) 100 MCG/2ML IJ SOLN
25.0000 ug | INTRAMUSCULAR | Status: DC | PRN
  Administered 2019-05-16: 50 ug via INTRAVENOUS
  Filled 2019-05-16: qty 2

## 2019-05-16 MED ORDER — LACTATED RINGERS IV SOLN
1000.0000 mL | Freq: Once | INTRAVENOUS | Status: AC
  Administered 2019-05-16: 1000 mL via INTRAVENOUS

## 2019-05-16 MED ORDER — MIDAZOLAM HCL 5 MG/5ML IJ SOLN
1.0000 mg | INTRAMUSCULAR | Status: DC | PRN
  Administered 2019-05-16: 4 mg via INTRAVENOUS
  Filled 2019-05-16: qty 5

## 2019-05-16 MED ORDER — TRIAMCINOLONE ACETONIDE 40 MG/ML IJ SUSP
80.0000 mg | Freq: Once | INTRAMUSCULAR | Status: AC
  Administered 2019-05-16: 80 mg
  Filled 2019-05-16: qty 2

## 2019-05-16 NOTE — Patient Instructions (Signed)
Patient will come in for a in-office procedure on 05/23/19 at 3:00 pm. Please feel free to contact our office if you have any questions or concerns.

## 2019-05-17 ENCOUNTER — Telehealth: Payer: Self-pay | Admitting: *Deleted

## 2019-05-17 MED FILL — HYDROCODON-APAP 7.5-325: 7.5-325 | 30 days supply | Qty: 90 | Fill #0

## 2019-05-17 NOTE — Progress Notes (Addendum)
Patient ID: Denise Burns, female   DOB: February 24, 1971, 48 y.o.   MRN: [id number]  Chief Complaint  Patient presents with  . Abscess    Chest wall 05/01/19     HPI Denise Burns is a 48 y.o. female.   She has been referred by her primary care provider, Clarene Reamer, FNP, for evaluation of a cyst or abscess on her chest wall.  Denise Burns states that she has had a similar lesion in the past that she was able to open and drain by herself.  She also had a large one on her back that had to heal by secondary intention.  The current lesion has been present for quite some time (10-12 years), but recently became inflamed and painful.  She saw her primary care provider on September 30 and was placed on a course of oral antibiotics.  This did improve her symptoms, but due to the recurre nature of this particular lesion, Denise Burns sought more definitive treatment.  She was initially referred to dermatology, but did not hear back from them and was therefore referred to general surgery for further evaluation.  Denise Burns states that this lesion has never drained spontaneously.  It has responded to the antibiotic treatment.  She denies any fevers or chills.  No nausea or vomiting.  Notably, she does have Addison's disease, diagnosed in 2006.  She is followed by Dr. Kelton Pillar for this condition and is aware of sick day rules.   Past Medical History:  Diagnosis Date  . Addison disease ([place])   . Arthritis   . Back pain   . GERD (gastroesophageal reflux disease)     Past Surgical History:  Procedure Laterality Date  . CHOLECYSTECTOMY    . POLYPECTOMY      Family History  Problem Relation Age of Onset  . Arthritis Mother   . Hyperlipidemia Mother   . Hypertension Mother   . Alcohol abuse Father   . Hearing loss Father   . Hyperlipidemia Father     Social History Social History   Tobacco Use  . Smoking status: Former Research scientist (life sciences)  . Smokeless tobacco: Never Used  Substance Use Topics  . Alcohol use:  Yes    Comment: occ  . Drug use: Never    Allergies  Allergen Reactions  . Lisinopril Cough  . Ultram [Tramadol] Other (See Comments)  . Sulfa Antibiotics Other (See Comments)    Current Outpatient Medications  Medication Sig Dispense Refill  . baclofen (LIORESAL) 10 MG tablet Take 1 tablet (10 mg total) by mouth daily. 30 tablet 2  . cetirizine (ZYRTEC) 10 MG tablet Take 10 mg by mouth daily.    . Cholecalciferol (VITAMIN D3) 50000 units TABS Take 1 tablet by mouth every 7 (seven) days. 12 tablet 3  . fludrocortisone (FLORINEF) 0.1 MG tablet Take 0.5 tablets (0.05 mg total) by mouth daily. 90 tablet 2  . HYDROcodone-acetaminophen (NORCO) 7.5-325 MG tablet Take 1 tablet by mouth every 8 (eight) hours as needed for severe pain. Must last 30 days. 90 tablet 0  . [START ON 06/16/2019] HYDROcodone-acetaminophen (NORCO) 7.5-325 MG tablet Take 1 tablet by mouth every 8 (eight) hours as needed for severe pain. Must last 30 days. 90 tablet 0  . [START ON 07/16/2019] HYDROcodone-acetaminophen (NORCO) 7.5-325 MG tablet Take 1 tablet by mouth every 8 (eight) hours as needed for severe pain. Must last 30 days. 90 tablet 0  . hydrocortisone (CORTEF) 10 MG tablet Take 1.5 tablets (15 mg total) by  mouth daily with breakfast AND 0.5 tablets (5 mg total) daily. 360 tablet 3  . meloxicam (MOBIC) 15 MG tablet Take 1 tablet (15 mg total) by mouth daily. 100 tablet 0  . pseudoephedrine (SUDAFED) 30 MG tablet Take 30 mg by mouth every 4 (four) hours as needed for congestion.     No current facility-administered medications for this visit.     Review of Systems Review of Systems  Musculoskeletal: Positive for back pain.  All other systems reviewed and are negative.   Blood pressure (!) 134/92, pulse 91, temperature 97.7 F (36.5 C), temperature source Temporal, height 5\' 4"  (1.626 m), weight 185 lb 12.8 oz (84.3 kg), last menstrual period 06/08/2018, SpO2 96 %. Body mass index is 31.89 kg/m.    Physical Exam Physical Exam Constitutional:      General: She is not in acute distress.    Appearance: Normal appearance. She is obese.  HENT:     Head: Normocephalic and atraumatic.     Nose:     Comments: Covered with a mask secondary to COVID-19 precautions    Mouth/Throat:     Comments: Covered with a mask secondary to COVID-19 precautions Eyes:     General: No scleral icterus.       Right eye: No discharge.        Left eye: No discharge.  Neck:     Musculoskeletal: No neck rigidity.     Comments: No palpable thyromegaly or dominant thyroid masses appreciated Cardiovascular:     Rate and Rhythm: Normal rate and regular rhythm.     Pulses: Normal pulses.  Pulmonary:     Effort: Pulmonary effort is normal.     Breath sounds: Normal breath sounds.  Chest:       Comments: There is a well-circumscribed approximately 1.5 cm circular raised mass on the patient's chest.  It is slightly fluctuant.  It is somewhat red, but the patient states this is improved since taking antibiotics.  It is nontender.  There is no drainage present. Abdominal:     General: Bowel sounds are normal.     Palpations: Abdomen is soft.  Genitourinary:    Comments: Deferred Musculoskeletal:        General: No deformity.  Lymphadenopathy:     Cervical: No cervical adenopathy.  Skin:    General: Skin is warm and dry.     Comments: No hyperpigmentation identified  Neurological:     General: No focal deficit present.     Mental Status: She is alert and oriented to person, place, and time.  Psychiatric:        Mood and Affect: Mood normal.        Behavior: Behavior normal.     Data Reviewed I reviewed the primary care provider's note of 05/01/2019, wherein she was prescribed antibiotics.  I have also reviewed the most recent endocrinology note from July, which indicates good control of the patient's Addison's disease.  Labs obtained that day were all within normal limits and are copied here:  Results  for Denise Burns, Denise Burns (MRN [id number]) as of 05/17/2019 13:17  Ref. Range 02/05/2019 07:57  Sodium Latest Ref Range: 135 - 145 mEq/L 140  Potassium Latest Ref Range: 3.5 - 5.1 mEq/L 3.5  Chloride Latest Ref Range: 96 - 112 mEq/L 106  CO2 Latest Ref Range: 19 - 32 mEq/L 27  Glucose Latest Ref Range: 70 - 99 mg/dL 83  BUN Latest Ref Range: 6 - 23 mg/dL 19  Creatinine Latest  Ref Range: 0.40 - 1.20 mg/dL 0.80  Calcium Latest Ref Range: 8.4 - 10.5 mg/dL 8.8  GFR Latest Ref Range: >60.00 mL/min 76.45   Assessment This is a 48 year old woman who has a history of lesions similar to the one for which she presents today.  These have been drained in the past successfully, however the one on her chest has recurred.  She is interested in definitive treatment.  Based upon my exam today, it appears most consistent with a sebaceous cyst that may have become secondarily infected.  Plan I have offered the patient an in office procedure to remove the sebaceous cyst.  Due to time constraints, we were unable to perform it in clinic today.  She will return next week for excision of the cyst.  I have suggested that she may want to consider utilizing her sick day rules for her steroid replacement to prevent an addisonian crisis.  The risks of the procedure were discussed with her, including but not limited to, bleeding, infection, wound healing problems, or recurrence.  She would like to proceed, so I will see her next week for the procedure.    Fredirick Maudlin 05/17/2019, 1:05 PM

## 2019-05-17 NOTE — Telephone Encounter (Signed)
Voicemail left re; procedure on yesterday, if there are questions or concerns they can call the office.

## 2019-05-23 ENCOUNTER — Ambulatory Visit: Payer: Managed Care, Other (non HMO) | Admitting: General Surgery

## 2019-05-23 MED FILL — BACLOFEN 10 MG TABS: 10 | 30 days supply | Qty: 30 | Fill #0

## 2019-05-30 ENCOUNTER — Ambulatory Visit (INDEPENDENT_AMBULATORY_CARE_PROVIDER_SITE_OTHER): Payer: Managed Care, Other (non HMO) | Admitting: General Surgery

## 2019-05-30 ENCOUNTER — Encounter: Payer: Self-pay | Admitting: General Surgery

## 2019-05-30 ENCOUNTER — Other Ambulatory Visit: Payer: Self-pay

## 2019-05-30 DIAGNOSIS — L723 Sebaceous cyst: Secondary | ICD-10-CM

## 2019-05-30 NOTE — Progress Notes (Signed)
Sebaceous Cyst Excision Procedure Note  Pre-operative Diagnosis: Epidermal cyst  Post-operative Diagnosis: same  Locations:middle chest  Indications: repeated infections and irritation of site  Anesthesia: Lidocaine 1% with epinephrine without added sodium bicarbonate  Procedure Details  History of allergy to iodine: no  Patient informed of the risks (including bleeding and infection) and benefits of the  procedure and Written informed consent obtained.  The lesion and surrounding area was given a sterile prep using chlorhexidine and draped in the usual sterile fashion. An incision was made over the cyst, which was dissected free of the surrounding tissue and removed.  The cyst was filled with typical sebaceous material.  The wound was closed with dermabond and steri strips. The specimen was not sent for pathologic examination. The patient tolerated the procedure well.  EBL: less than 1 ml  Findings: Sebaceous cyst  Condition: Stable  Complications: none.  Plan: 1. Instructed to keep the wound dry and covered for 24-48h and clean thereafter. 2. Warning signs of infection were reviewed.   3. Recommended that the patient use OTC analgesics as needed for pain.  4. Return PRN.

## 2019-05-30 NOTE — Patient Instructions (Signed)
You may shower Saturday. Do not submerge the wound for 2 weeks.   Please call if there are questions or concerns.

## 2019-06-03 ENCOUNTER — Telehealth: Payer: Self-pay | Admitting: *Deleted

## 2019-06-03 ENCOUNTER — Other Ambulatory Visit: Payer: Self-pay

## 2019-06-03 ENCOUNTER — Ambulatory Visit: Payer: Managed Care, Other (non HMO) | Attending: Pain Medicine | Admitting: Pain Medicine

## 2019-06-03 ENCOUNTER — Telehealth: Payer: Self-pay

## 2019-06-03 DIAGNOSIS — G894 Chronic pain syndrome: Secondary | ICD-10-CM

## 2019-06-03 DIAGNOSIS — M47816 Spondylosis without myelopathy or radiculopathy, lumbar region: Secondary | ICD-10-CM | POA: Diagnosis not present

## 2019-06-03 DIAGNOSIS — M47817 Spondylosis without myelopathy or radiculopathy, lumbosacral region: Secondary | ICD-10-CM

## 2019-06-03 DIAGNOSIS — G8929 Other chronic pain: Secondary | ICD-10-CM

## 2019-06-03 NOTE — Progress Notes (Signed)
Pain relief after procedure (treated area only): (Questions asked to patient) 1. About 15 minutes after the procedure, while the area was still numb from the local anesthetics, were you having any pain in the area? First 1 hour: 100 % better. Initial 4-6 hours:100% better. 2. How many days was the area numb? Any benefit longer than 6 hours: 100 % better. 3. How much better is your pain now, when compared to before the procedure? Current benefit: 50 % better. 4. Can you move better now? Improvement in ROM (Range of Motion): Yes. 5. Can you do more now? Improvement in function: Yes. 4. Did you have any problems with the procedure? Side-effects/Complications: No.

## 2019-06-03 NOTE — Telephone Encounter (Signed)
Attempted to call patient to go over pre virtual appointment questions.  Left message.

## 2019-06-03 NOTE — Progress Notes (Signed)
Pain Management Virtual Encounter Note - Virtual Visit via Telephone Telehealth (real-time audio visits between healthcare provider and patient).   Patient's Phone No. & Preferred Pharmacy:  [phone] (home); [phone] (mobile); (Preferred) [phone] [email or web address]  [place], [place] [address] Phone: [phone] Fax: [phone]    Pre-screening note:  Our staff contacted Ms. Klapp and offered her an "in person", "face-to-face" appointment versus a telephone encounter. She indicated preferring the telephone encounter, at this time.   Reason for Virtual Visit: COVID-19*  Social distancing based on CDC and AMA recommendations.   I contacted Mahria Dehring on 06/03/2019 via telephone.      I clearly identified myself as [name], MD. I verified that I was speaking with the correct person using two identifiers (Name: Ilani Prestridge, and date of birth: Jun 12, 1971).  Advanced Informed Consent I sought verbal advanced consent from Braulio Conte for virtual visit interactions. I informed Ms. Schoof of possible security and privacy concerns, risks, and limitations associated with providing "not-in-person" medical evaluation and management services. I also informed Ms. Philip of the availability of "in-person" appointments. Finally, I informed her that there would be a charge for the virtual visit and that she could be  personally, fully or partially, financially responsible for it. Ms. Strutz expressed understanding and agreed to proceed.   Historic Elements   Ms. Audrielle Reger is a 48 y.o. year old, female patient evaluated today after her last encounter by our practice on 05/17/2019. Ms. Vescovi  has a past medical history of Addison disease ([place]), Arthritis, Back pain, and GERD (gastroesophageal reflux disease). She also  has a past surgical history that includes Cholecystectomy and  Polypectomy. Ms. Saulino has a current medication list which includes the following prescription(s): baclofen, cetirizine, vitamin d3, fludrocortisone, hydrocodone-acetaminophen, hydrocortisone, meloxicam, and pseudoephedrine. She  reports that she has quit smoking. She has never used smokeless tobacco. She reports current alcohol use. She reports that she does not use drugs. Ms. Trimpe is allergic to lisinopril; ultram [tramadol]; and sulfa antibiotics.   HPI  Today, she is being contacted for a post-procedure assessment.  Post-Procedure Evaluation  Procedure: Diagnostic bilateral lumbar facet block #2 under fluoroscopic guidance and IV sedation Pre-procedure pain level:  3/10 Post-procedure: 0/10 (100% relief)  Sedation: Sedation provided.  Effectiveness during initial hour after procedure(Ultra-Short Term Relief):   100%  Local anesthetic used: Long-acting (4-6 hours) Effectiveness: Defined as any analgesic benefit obtained secondary to the administration of local anesthetics. This carries significant diagnostic value as to the etiological location, or anatomical origin, of the pain. Duration of benefit is expected to coincide with the duration of the local anesthetic used.  Effectiveness during initial 4-6 hours after procedure(Short-Term Relief):   100%  Long-term benefit: Defined as any relief past the pharmacologic duration of the local anesthetics.  Effectiveness past the initial 6 hours after procedure(Long-Term Relief):   100%  Current benefits: Defined as benefit that persist at this time.   Analgesia:  50% improved Function: Ms. Cheeseboro reports improvement in function ROM: Ms. Mccolgan reports improvement in ROM  Pharmacotherapy Assessment  Analgesic: Hydrocodone/APAP 7.5/325 1 tablet p.o. 3 times daily (22.5 mg/day of hydrocodone) (should have enough until 08/15/2019) MME/day:22.5mg /day.   Monitoring: Pharmacotherapy: No side-effects or adverse reactions reported. [place]  PMP: PDMP not reviewed this encounter.       Compliance: No problems identified. Effectiveness: Clinically acceptable. Plan: Refer to "POC".  UDS:  Summary  Date Value Ref Range Status  08/16/2018 FINAL  Final    Comment:    ==================================================================== TOXAS[facility]RE COMP DRUG ANALYSIS,UR ==================================================================== Test                             Result       Flag       Units Drug Present and Declared for Prescription Verification   Hydrocodone                    205          EXPECTED   ng/mg creat   Hydromorphone                  671          EXPECTED   ng/mg creat   Dihydrocodeine                 153          EXPECTED   ng/mg creat   Norhydrocodone                 642          EXPECTED   ng/mg creat    Sources of hydrocodone include scheduled prescription    medications. Hydromorphone, dihydrocodeine and norhydrocodone are    expected metabolites of hydrocodone. Hydromorphone and    dihydrocodeine are also available as scheduled prescription    medications.   Ephedrine/Pseudoephedrine      PRESENT      EXPECTED   Phenylpropanolamine            PRESENT      EXPECTED    Source of ephedrine/pseudoephedrine is most commonly    pseudoephedrine in over-the-counter or prescription cold and    allergy medications. Phenylpropanolamine is an expected    metabolite of ephedrine/pseudoephedrine.   Baclofen                       PRESENT      EXPECTED   Acetaminophen                  PRESENT      EXPECTED Drug Present not Declared for Prescription Verification   Ibuprofen                      PRESENT      UNEXPECTED   Doxylamine                     PRESENT      UNEXPECTED   Dextrorphan/Levorphanol        PRESENT      UNEXPECTED    Dextrorphan is an expected metabolite of dextromethorphan, an    over-the-counter or prescription cough suppressant. Levorphanol    is a scheduled prescription medication. Dextrorphan  cannot be    distinguished from levorphanol by the method used for analysis. ==================================================================== Test                      Result    Flag   Units      Ref Range   Creatinine              234              mg/dL      >=20 ==================================================================== Declared Medications:  The flagging and interpretation on this report are  based on the  following declared medications.  Unexpected results may arise from  inaccuracies in the declared medications.  **Note: The testing scope of this panel includes these medications:  Baclofen  Hydrocodone (Hydrocodone-Acetaminophen)  Pseudoephedrine (Sudafed)  **Note: The testing scope of this panel does not include small to  moderate amounts of these reported medications:  Acetaminophen (Hydrocodone-Acetaminophen)  **Note: The testing scope of this panel does not include following  reported medications:  Cholecalciferol  Fexofenadine  Fludrocortisone (Florinef)  Hydrocortisone (Cortef) ==================================================================== For clinical consultation, please call [phone]. ====================================================================    Laboratory Chemistry Profile (12 mo)  Renal: 08/16/2018: BUN/Creatinine Ratio 18 02/05/2019: BUN 19; Creatinine, Ser 0.80  Lab Results  Component Value Date   GFR 76.45 02/05/2019   GFRAA 86 08/16/2018   GFRNONAA 74 08/16/2018   Hepatic: 08/16/2018: Albumin 4.4 Lab Results  Component Value Date   AST 18 08/16/2018   Other: 08/16/2018: 25-Hydroxy, Vitamin D 67; 25-Hydroxy, Vitamin D-2 <1.0; 25-Hydroxy, Vitamin D-3 67; CRP 2; Sed Rate 37; Vitamin B-12 932 Note: Above Lab results reviewed.  Imaging  Last 90 days:  Dg Pain Clinic C-arm 1-60 Min No Report  Result Date: 05/16/2019 Fluoro was used, but no Radiologist interpretation will be provided. Please refer to "NOTES" tab for provider  progress note.   Assessment  The primary encounter diagnosis was Lumbar facet syndrome (Bilateral). Diagnoses of Spondylosis without myelopathy or radiculopathy, lumbosacral region, Lumbar facet hypertrophy (Multilevel) (Bilateral), Chronic low back pain (Primary Area of Pain) (Bilateral) (R>L) w/o sciatica, and Chronic pain syndrome were also pertinent to this visit.  Plan of Care  I am having Braulio Conte maintain her Vitamin D3, pseudoephedrine, cetirizine, hydrocortisone, fludrocortisone, baclofen, HYDROcodone-acetaminophen, and meloxicam.  Pharmacotherapy (Medications Ordered): No orders of the defined types were placed in this encounter.  Orders:  No orders of the defined types were placed in this encounter.  Follow-up plan:   Return in about 2 months (around 08/14/2019) for (VV), (MM).      Considering: Therapeutic bilateral lumbar facetRFA #1(starting with the right side) Diagnosticbilateralsacroiliac joint injection Possiblebilateralsacroiliac joint RFA Diagnostic bilateral L5 TFESI Diagnostic left-sided L2 TFESI   PRN Procedures: Diagnostic bilateral lumbar facet nerve block #2      Recent Visits Date Type Provider Dept  05/16/19 Procedure visit Milinda Pointer, MD [facility]  05/08/19 Office Visit Milinda Pointer, MD [facility]  Showing recent visits within past 90 days and meeting all other requirements   Today's Visits Date Type Provider Dept  06/03/19 Telemedicine Milinda Pointer, MD [facility]  Showing today's visits and meeting all other requirements   Future Appointments No visits were found meeting these conditions.  Showing future appointments within next 90 days and meeting all other requirements   I discussed the assessment and treatment plan with the patient. The patient was provided an opportunity to ask questions and all were answered. The patient agreed with the plan and demonstrated an  understanding of the instructions.  Patient advised to call back or seek an in-person evaluation if the symptoms or condition worsens.  Total duration of non-face-to-face encounter: 13 minutes.  Note by: [name], MD Date: 06/03/2019; Time: 3:07 PM  Note: This dictation was prepared with Dragon dictation. Any transcriptional errors that may result from this process are unintentional.  Disclaimer:  * Given the special circumstances of the COVID-19 pandemic, the federal government has announced that the Office for Civil Rights (OCR) will exercise its enforcement discretion and will not impose  penalties on physicians using telehealth in the event of noncompliance with regulatory requirements under the [place] and Accountability Act (HIPAA) in connection with the good faith provision of telehealth during the OHFGB-02 national public health emergency. (AMA)

## 2019-06-04 ENCOUNTER — Telehealth: Payer: Self-pay | Admitting: General Surgery

## 2019-06-04 NOTE — Telephone Encounter (Signed)
Patient is calling about her incision site was bleeding, just a little sore but no infection. Please call patient and advise.

## 2019-06-04 NOTE — Telephone Encounter (Signed)
Per Dr.Cannon patient may experience some drainage due to she did not use stitches for the wound. Per Dr.Cannon, patient may apply a pressure dressing to the area. Patient advised to contact our office if she has any other questions or concerns. Patient verbalizes understanding.

## 2019-06-15 MED FILL — HYDROCODON-APAP 7.5-325: 7.5-325 | 30 days supply | Qty: 90 | Fill #0

## 2019-07-03 MED FILL — FLUDROCORTISONE 0.1 MG TAB: 0.1 | 90 days supply | Qty: 45 | Fill #1

## 2019-07-03 MED FILL — HYDROCORTISONE 10 MG TABLET: 10 | 30 days supply | Qty: 60 | Fill #1

## 2019-07-08 MED FILL — BACLOFEN 10 MG TABS: 10 | 30 days supply | Qty: 30 | Fill #1

## 2019-07-11 ENCOUNTER — Encounter (HOSPITAL_BASED_OUTPATIENT_CLINIC_OR_DEPARTMENT_OTHER): Payer: Self-pay | Admitting: Obstetrics & Gynecology

## 2019-07-11 ENCOUNTER — Other Ambulatory Visit: Payer: Self-pay

## 2019-07-15 ENCOUNTER — Other Ambulatory Visit (HOSPITAL_COMMUNITY): Admission: RE | Admit: 2019-07-15 | Payer: Managed Care, Other (non HMO) | Source: Ambulatory Visit

## 2019-07-15 NOTE — H&P (Signed)
48yo PM female who presents for hysteroscopy, D&C  Postmenopausal bleeding: About a month ago she has noted dark-blood that started about 2 hrs ago. Yesterday she noted some bloating and pelvic pain then today when to the bathroom and noted bright red blood that soaked the toilet paper.  Denies vaginal discharge, itching or irritation. No urinary concerns  Last [place] 02/20/2019: 7.9cm anteverted uterus- normal size and shape- ant. fundal fibroid- 1.5cm. Normal endometrium- 0.29cm. Left ovary with 3 small simple cysts- largest 1.1cm- avascular. Right ovary with 1.5cm simple cyst. No free fluid or adnexal masses seen  05/2018- EMB: benign inactive endometrium. negative for malignancy or hyperplasia Last pap/hpv 06/2018 neg (outside facility)  Current Medications  Taking   Norco(Acetaminophen-HYDROcodone) 7.5-325 MG Tablet 1 tablet as needed Orally every 6 hrs   Hydrocortisone 5 MG Tablet 1 tablet with food or milk Orally every 8 hrs, Notes: x 2 10mg    Vitamin D (Ergocalciferol) 50 MCG (2000 UT) Capsule 1 capsule Orally Once a day   Fludrocortisone Acetate 0.1 MG Tablet 1 tablet Orally Three times a Week, Notes: half a tab   Zyrtec Allergy(Cetirizine HCl) 10 MG Tablet 1 tablet Orally Once a day   Not-Taking   Provera(medroxyPROGESTERone) 10 MG Tablet 1 tablet with food Orally Once a day   Unknown   Allegra(Fexofenadine HCl) 60 MG Capsule as directed Orally   Medication List reviewed and reconciled with the patient    Past Medical History  Addison disease.   Back pain.    Surgical History  cholecysyectomy   v/c polypectomy   laporascopy    Family History  No Family History documented.   Social History  General:  EXPOSURE TO PASSIVE SMOKE: no.  Alcohol: no.  Caffeine: yes.  Tobacco use  cigarettes: Never smoked Tobacco history last updated 06/13/2019 Marital Status: Married.  Recreational drug use: no.    Gyn History  Sexual activity currently sexually active.  Periods :  postmenopausal.  LMP 06-13-2019 .  Birth control n/a.  Last pap smear date 06-2018 .  Menarche 70.  GYN procedures US tv , Endometrial Biopsy.    OB History  Never been pregnant per patient.    Allergies  Lisinopril: rash   Ultram: muscle tension   Sulfacetamide Sodium (Acne): sores   Hospitalization/Major Diagnostic Procedure  addisons disease 2006    Review of Systems  CONSTITUTIONAL:  no Appetite changes. no Chills. no Fatigue. no Fever.  CARDIOLOGY:  no Chest pain.  RESPIRATORY:  no Shortness of breath. no Cough.  UROLOGY:  no Dysuria. no Urinary frequency. no Urinary incontinence. no Urinary urgency.  GASTROENTEROLOGY:  no Abdominal pain. Bloating/belching yes. no Change in bowel habits. no Change in bowel movements.  FEMALE REPRODUCTIVE:  See HPI for details. no Breast lumps or discharge. no Breast pain.  NEUROLOGY:  no Dizziness. no Headache.  PSYCHOLOGY:  no Anxiety. no Depression.  SKIN:  no Rash. no Hives.  HEMATOLOGY/LYMPH:  no Anemia. Using Blood Thinners no.     Vital Signs  Wt 186, Wt change 2.7 lb, Ht 64, BMI 31.92, Temp 97.3, Pulse sitting 100, BP sitting 120/82.   Examination  General Examination: CONSTITUTIONAL: well nourished, well developed.  SKIN: warm & dry, no rashes.  NECK: supple, normal appearance, thyroid not enlarged.  BREASTS: no palpable masses bilaterally, no nipple discharge bilaterally.  LUNGS: clear to auscultation bilaterally, no wheezes, rhonchi, rales.  HEART: no murmurs, regular rate and rhythm.  ABDOMEN: obese, no masses palpated,soft and not tender, no rebound,  no guarding.  FEMALE GENITOURINARY: Normal urethra, No external lesions, Vagina - pink moist mucosa, no lesions or ~ 10cc of dark red thick blood noted in vault,cervix - visualized, no discharge or lesions. No CMT. No adnexal masses bilaterally. Uterus: nontender and normal size on palpation.  EXTREMITIES: no edema present, no calf tenderness bilaterally.  LYMPH  NODES: no inguinal adenopathy, no axillary adenopathy.  PSYCH: oriented x 3, appropriate mood and affect.     A/P: 48yo postmenopausal female who presents for hysteroscopy, D&C  -NPO -LR @ 125cc/hr -SCDs to OR -risk/benefits and alternatives were reviewed with patient including risk of bleeding, infection and potential injury.  Questions and concerns were addressed and she desires to proceed  Janyth Pupa, DO [phone] (cell) [phone] (office)

## 2019-07-16 MED FILL — HYDROCODON-APAP 7.5-325: 7.5-325 | 30 days supply | Qty: 90 | Fill #0

## 2019-08-07 ENCOUNTER — Telehealth: Payer: Self-pay

## 2019-08-07 NOTE — Telephone Encounter (Signed)
Patient called in wanting to ask Dr if it was ok to receive Covid shot with her current health conditions     Please call and advise

## 2019-08-08 ENCOUNTER — Encounter: Payer: Self-pay | Admitting: Pain Medicine

## 2019-08-08 NOTE — Telephone Encounter (Signed)
I will reach out to pt but please direct these type calls and questions to PCP

## 2019-08-09 ENCOUNTER — Other Ambulatory Visit: Payer: Self-pay

## 2019-08-11 NOTE — Progress Notes (Signed)
Patient: Denise Burns  Service Category: E/M  Provider: [name], MD  DOB: 29-Apr-1971  DOS: 08/12/2019  Referring Provider: Elby Beck, FNP  MRN: [id number]  Setting: Ambulatory outpatient  PCP: Elby Beck, FNP  Type: Established Patient  Specialty: Interventional Pain Management    Location: External  Delivery: TeleHealth     Virtual Encounter - Pain Management PROVIDER NOTE: Information contained herein reflects review and annotations entered in association with encounter. Interpretation of such information and data should be left to medically-trained personnel. Information provided to patient can be located elsewhere in the medical record under "Patient Instructions". Document created using STT-dictation technology, any transcriptional errors that may result from process are unintentional.    Contact & Pharmacy Preferred: [phone] Home: [phone] (home) Mobile: [phone] (mobile) E-mail: [email or web address]  [place], [place] [address] Phone: [phone] Fax: [phone]   Pre-screening  [name] offered "in-person" vs "virtual" encounter. She indicated preferring virtual for this encounter.   Reason COVID-19*  Social distancing based on CDC and AMA recommendations.   I contacted Krysti Deist on 08/12/2019 via telephone.      I clearly identified myself as [name], MD. I verified that I was speaking with the correct person using two identifiers (Name: Aidynn Popolizio, and date of birth: 1971-01-06).  Consent I sought verbal advanced consent from Braulio Conte for virtual visit interactions. I informed Ms. Dunckel of possible security and privacy concerns, risks, and limitations associated with providing "not-in-person" medical evaluation and management services. I also informed Ms. Olaes of the availability of "in-person" appointments. Finally, I  informed her that there would be a charge for the virtual visit and that she could be  personally, fully or partially, financially responsible for it. Ms. Villanova expressed understanding and agreed to proceed.   Historic Elements   Ms. Brindley Dalton is a 49 y.o. year old, female patient evaluated today after her last encounter by our practice on 06/03/2019. Ms. Hoek  has a past medical history of Addison disease ([place]), Arthritis, Back pain, and GERD (gastroesophageal reflux disease). She also  has a past surgical history that includes Cholecystectomy and Polypectomy. Ms. Trojanowski has a current medication list which includes the following prescription(s): [START ON 08/15/2019] baclofen, cetirizine, fludrocortisone, [START ON 08/15/2019] hydrocodone-acetaminophen, [START ON 09/14/2019] hydrocodone-acetaminophen, [START ON 10/14/2019] hydrocodone-acetaminophen, hydrocortisone, [START ON 08/15/2019] meloxicam, and pseudoephedrine. She  reports that she has quit smoking. She has never used smokeless tobacco. She reports current alcohol use. She reports that she does not use drugs. Ms. Forst is allergic to lisinopril; ultram [tramadol]; and sulfa antibiotics.   HPI  Today, she is being contacted for medication management.  The patient indicates doing well with the current medication regimen. No adverse reactions or side effects reported to the medications.   Pharmacotherapy Assessment  Analgesic: Hydrocodone/APAP 7.5/325 1 tablet p.o. 3 times daily (22.5 mg/day of hydrocodone) (should have enough until 08/15/2019) MME/day:22.5mg /day.   Monitoring: Pharmacotherapy: No side-effects or adverse reactions reported. [place] PMP: PDMP reviewed during this encounter.       Compliance: No problems identified. Effectiveness: Clinically acceptable. Plan: Refer to "POC".  UDS:  Summary  Date Value Ref Range Status  08/16/2018 FINAL  Final    Comment:     ==================================================================== TOXASSURE COMP DRUG ANALYSIS,UR ==================================================================== Test  Result       Flag       Units Drug Present and Declared for Prescription Verification   Hydrocodone                    205          EXPECTED   ng/mg creat   Hydromorphone                  671          EXPECTED   ng/mg creat   Dihydrocodeine                 153          EXPECTED   ng/mg creat   Norhydrocodone                 642          EXPECTED   ng/mg creat    Sources of hydrocodone include scheduled prescription    medications. Hydromorphone, dihydrocodeine and norhydrocodone are    expected metabolites of hydrocodone. Hydromorphone and    dihydrocodeine are also available as scheduled prescription    medications.   Ephedrine/Pseudoephedrine      PRESENT      EXPECTED   Phenylpropanolamine            PRESENT      EXPECTED    Source of ephedrine/pseudoephedrine is most commonly    pseudoephedrine in over-the-counter or prescription cold and    allergy medications. Phenylpropanolamine is an expected    metabolite of ephedrine/pseudoephedrine.   Baclofen                       PRESENT      EXPECTED   Acetaminophen                  PRESENT      EXPECTED Drug Present not Declared for Prescription Verification   Ibuprofen                      PRESENT      UNEXPECTED   Doxylamine                     PRESENT      UNEXPECTED   Dextrorphan/Levorphanol        PRESENT      UNEXPECTED    Dextrorphan is an expected metabolite of dextromethorphan, an    over-the-counter or prescription cough suppressant. Levorphanol    is a scheduled prescription medication. Dextrorphan cannot be    distinguished from levorphanol by the method used for analysis. ==================================================================== Test                      Result    Flag   Units      Ref Range   Creatinine               234              mg/dL      >=20 ==================================================================== Declared Medications:  The flagging and interpretation on this report are based on the  following declared medications.  Unexpected results may arise from  inaccuracies in the declared medications.  **Note: The testing scope of this panel includes these medications:  Baclofen  Hydrocodone (Hydrocodone-Acetaminophen)  Pseudoephedrine (Sudafed)  **Note: The testing scope of this panel does not include small to  moderate amounts of these reported medications:  Acetaminophen (Hydrocodone-Acetaminophen)  **Note: The testing scope of this panel does not include following  reported medications:  Cholecalciferol  Fexofenadine  Fludrocortisone (Florinef)  Hydrocortisone (Cortef) ==================================================================== For clinical consultation, please call [phone]. ====================================================================    Laboratory Chemistry Profile (12 mo)  Renal: 08/16/2018: BUN/Creatinine Ratio 18 02/05/2019: BUN 19; Creatinine, Ser 0.80  Lab Results  Component Value Date   GFR 76.45 02/05/2019   GFRAA 86 08/16/2018   GFRNONAA 74 08/16/2018   Hepatic: 08/16/2018: Albumin 4.4 Lab Results  Component Value Date   AST 18 08/16/2018   Other: 08/16/2018: 25-Hydroxy, Vitamin D 67; 25-Hydroxy, Vitamin D-2 <1.0; 25-Hydroxy, Vitamin D-3 67; CRP 2; Sed Rate 37; Vitamin B-12 932 Note: Above Lab results reviewed.  Imaging  DG PAIN CLINIC C-ARM 1-60 MIN NO REPORT Fluoro was used, but no Radiologist interpretation will be provided.  Please refer to "NOTES" tab for provider progress note.   Assessment  The primary encounter diagnosis was Chronic pain syndrome. Diagnoses of Chronic low back pain (Primary Area of Pain) (Bilateral) (R>L) w/o sciatica, Lumbar facet syndrome (Bilateral), Chronic sacroiliac joint pain (Secondary Area of Pain)  (Right), Chronic musculoskeletal pain, Osteoarthritis of sacroiliac joint (Bilateral), and Osteoarthritis of facet joint of lumbar spine were also pertinent to this visit.  Plan of Care  Problem-specific:  No problem-specific Assessment & Plan notes found for this encounter.  I am having Braulio Conte start on HYDROcodone-acetaminophen and HYDROcodone-acetaminophen. I am also having her maintain her pseudoephedrine, cetirizine, hydrocortisone, fludrocortisone, HYDROcodone-acetaminophen, baclofen, and meloxicam.  Pharmacotherapy (Medications Ordered): Meds ordered this encounter  Medications  . HYDROcodone-acetaminophen (NORCO) 7.5-325 MG tablet    Sig: Take 1 tablet by mouth every 8 (eight) hours as needed for severe pain. Must last 30 days.    Dispense:  90 tablet    Refill:  0    Chronic Pain: STOP Act (Not applicable) Fill 1 day early if closed on refill date. Do not fill until: 08/15/2019. To last until: 09/14/2019. Avoid benzodiazepines within 8 hours of opioids  . HYDROcodone-acetaminophen (NORCO) 7.5-325 MG tablet    Sig: Take 1 tablet by mouth every 8 (eight) hours as needed for severe pain. Must last 30 days.    Dispense:  90 tablet    Refill:  0    Chronic Pain: STOP Act (Not applicable) Fill 1 day early if closed on refill date. Do not fill until: 09/14/2019. To last until: 10/14/2019. Avoid benzodiazepines within 8 hours of opioids  . HYDROcodone-acetaminophen (NORCO) 7.5-325 MG tablet    Sig: Take 1 tablet by mouth every 8 (eight) hours as needed for severe pain. Must last 30 days.    Dispense:  90 tablet    Refill:  0    Chronic Pain: STOP Act (Not applicable) Fill 1 day early if closed on refill date. Do not fill until: 10/14/2019. To last until: 11/13/2019. Avoid benzodiazepines within 8 hours of opioids  . baclofen (LIORESAL) 10 MG tablet    Sig: Take 1 tablet (10 mg total) by mouth daily.    Dispense:  90 tablet    Refill:  1    Fill one day early if pharmacy is closed on  scheduled refill date. May substitute for generic if available.  . meloxicam (MOBIC) 15 MG tablet    Sig: Take 1 tablet (15 mg total) by mouth daily.    Dispense:  90 tablet    Refill:  1    Fill one day early  if pharmacy is closed on scheduled refill date. May substitute for generic if available.   Orders:  No orders of the defined types were placed in this encounter.  Follow-up plan:   Return in about 3 months (around 11/13/2019) for (VV), (MM).      Considering: Therapeutic bilateral lumbar facetRFA #1(starting with the right side) Diagnosticbilateralsacroiliac joint injection Possiblebilateralsacroiliac joint RFA Diagnostic bilateral L5 TFESI Diagnostic left-sided L2 TFESI   PRN Procedures: Diagnostic bilateral lumbar facet nerve block #2       Recent Visits Date Type Provider Dept  06/03/19 Telemedicine Milinda Pointer, MD [facility]  05/16/19 Procedure visit Milinda Pointer, MD [facility]  Showing recent visits within past 90 days and meeting all other requirements   Today's Visits Date Type Provider Dept  08/12/19 Telemedicine Milinda Pointer, MD [facility]  Showing today's visits and meeting all other requirements   Future Appointments No visits were found meeting these conditions.  Showing future appointments within next 90 days and meeting all other requirements   I discussed the assessment and treatment plan with the patient. The patient was provided an opportunity to ask questions and all were answered. The patient agreed with the plan and demonstrated an understanding of the instructions.  Patient advised to call back or seek an in-person evaluation if the symptoms or condition worsens.  Total duration of non-face-to-face encounter: 12 minutes.  Note by: [name], MD Date: 08/12/2019; Time: 9:53 AM

## 2019-08-12 ENCOUNTER — Ambulatory Visit (HOSPITAL_BASED_OUTPATIENT_CLINIC_OR_DEPARTMENT_OTHER)
Admission: RE | Admit: 2019-08-12 | Payer: Managed Care, Other (non HMO) | Source: Home / Self Care | Admitting: Obstetrics & Gynecology

## 2019-08-12 ENCOUNTER — Other Ambulatory Visit: Payer: Self-pay

## 2019-08-12 ENCOUNTER — Telehealth: Payer: Self-pay | Admitting: *Deleted

## 2019-08-12 ENCOUNTER — Ambulatory Visit: Payer: Managed Care, Other (non HMO) | Attending: Pain Medicine | Admitting: Pain Medicine

## 2019-08-12 DIAGNOSIS — M533 Sacrococcygeal disorders, not elsewhere classified: Secondary | ICD-10-CM | POA: Diagnosis not present

## 2019-08-12 DIAGNOSIS — M47816 Spondylosis without myelopathy or radiculopathy, lumbar region: Secondary | ICD-10-CM

## 2019-08-12 DIAGNOSIS — G894 Chronic pain syndrome: Secondary | ICD-10-CM

## 2019-08-12 DIAGNOSIS — M545 Low back pain, unspecified: Secondary | ICD-10-CM

## 2019-08-12 DIAGNOSIS — M461 Sacroiliitis, not elsewhere classified: Secondary | ICD-10-CM

## 2019-08-12 DIAGNOSIS — M7918 Myalgia, other site: Secondary | ICD-10-CM

## 2019-08-12 DIAGNOSIS — G8929 Other chronic pain: Secondary | ICD-10-CM

## 2019-08-12 SURGERY — DILATATION AND CURETTAGE /HYSTEROSCOPY
Anesthesia: Choice

## 2019-08-12 MED ORDER — HYDROCODONE-ACETAMINOPHEN 7.5-325 MG PO TABS: 1.0000 | ORAL_TABLET | Freq: Three times a day (TID) | ORAL | 0 refills | Status: DC | PRN

## 2019-08-12 MED ORDER — BACLOFEN 10 MG PO TABS: 10.0000 mg | ORAL_TABLET | Freq: Every day | ORAL | 1 refills | Status: DC

## 2019-08-12 MED ORDER — MELOXICAM 15 MG PO TABS: 15.0000 mg | ORAL_TABLET | Freq: Every day | ORAL | 1 refills | Status: DC

## 2019-08-13 ENCOUNTER — Other Ambulatory Visit: Payer: Self-pay

## 2019-08-13 ENCOUNTER — Ambulatory Visit (INDEPENDENT_AMBULATORY_CARE_PROVIDER_SITE_OTHER): Payer: Managed Care, Other (non HMO) | Admitting: Internal Medicine

## 2019-08-13 ENCOUNTER — Encounter: Payer: Self-pay | Admitting: Internal Medicine

## 2019-08-13 VITALS — BP 122/84 | Temp 98.2°F | Ht 64.0 in | Wt 186.2 lb

## 2019-08-13 DIAGNOSIS — E271 Primary adrenocortical insufficiency: Secondary | ICD-10-CM

## 2019-08-13 LAB — BASIC METABOLIC PANEL
BUN: 16 mg/dL (ref 6–23)
CO2: 25 mEq/L (ref 19–32)
Calcium: 8.9 mg/dL (ref 8.4–10.5)
Chloride: 106 mEq/L (ref 96–112)
Creatinine, Ser: 0.84 mg/dL (ref 0.40–1.20)
GFR: 72.11 mL/min (ref >60.00)
Glucose, Bld: 115 mg/dL — ABNORMAL HIGH (ref 70–99)
Potassium: 3.6 mEq/L (ref 3.5–5.1)
Sodium: 141 mEq/L (ref 135–145)

## 2019-08-13 LAB — TSH: TSH: 1.92 u[IU]/mL (ref 0.35–4.50)

## 2019-08-13 LAB — T4, FREE: Free T4: 0.69 ng/dL (ref 0.60–1.60)

## 2019-08-13 NOTE — Patient Instructions (Signed)
MEDICATIONS:   - Hydrocortisone 15 mg every morning - Hydrocortisone 5 mg every afternoon ( Between 2-4 pm) - Continue Florinef at half a tablet daily       ADRENAL INSUFFICIENCY SICK DAY RULES:  Should you face an extreme emotional or physical stress such as trauma, surgery or acute illness, this will require extra steroid coverage so that the body can meet that stress.   Without increasing the steroid dose you may experience severe weakness, headache, dizziness, nausea and vomiting and possibly a more serious deterioration in health.  Typically the dose of steroids will only need to be increased for a couple of days if you have an illness that is transient and managed in the community.   If you are unable to take/absorb an increased dose of steroids orally because of vomiting or diarrhea, you will urgently require steroid injections and should present to an Emergency Department.  The general advice for any serious illness is as follows: 1. Double the normal daily steroid dose for up to 3 days if you have a temperature of more than 37.50C (99.50F) with signs of sickness, or severe emotional or physical distress 2. Contact your primary care doctor and Endocrinologist if the illness worsens or it lasts for more than 3 days.  3. In cases of severe illness, urgent medical assistance should be promptly sought. 4. If you experience vomiting/diarrhea or are unable to take steroids by mouth, please administer the Hydrocortisone injection kit and seek urgent medical help.      

## 2019-08-13 NOTE — Progress Notes (Signed)
Name: Denise Burns  MRN/ DOB: [id number], 08-17-1970    Age/ Sex: 49 y.o., female     PCP: Elby Beck, FNP   Reason for Endocrinology Evaluation: Addison's Disease     Initial Endocrinology Clinic Visit: 08/06/2018    PATIENT IDENTIFIER: Denise Burns is a 49 y.o., female with a past medical history of Addison's Disease and premature ovarian  failure . She has followed with [facility] since 08/06/2018 for consultative assistance with management of her adrenal insufficiency.   HISTORICAL SUMMARY: The patient was first diagnosed with addison's disease in 2006. She presented with weight loss, hyperpigmentation, and  hypotension. She did not have cosyntropin stim test. She has been on [facility] and Florinef since her diagnosis.   Premature ovarian failure ~ 2017. She did have a postmenopausal bleed between 05/2018- 06/2018. Follows with Dr. Nelda Marseille   No FH of premature ovarian failure or autoimmune disease.   SUBJECTIVE:   During last visit (02/05/2019): Continued HC dose 15 mg in AM and 5 mg QPM  Today (08/13/2019):  [name] is here for a 6 month follow up on adrenal insufficiency. She has been compliant with HC intake.  She denies any weight loss, nausea, dizziness or diarrhea.  She continues to wear her bracelet.   She does receive intra-articular injection of the spine ~ 2x per years No   She has established with new Gyn but no interventions so far, found ovarian cysts, further imaging delayed due to [place].   ROS:  As per HPI.   HISTORY:  Past Medical History:  Past Medical History:  Diagnosis Date  . Addison disease ([place])   . Arthritis   . Back pain   . GERD (gastroesophageal reflux disease)    Past Surgical History:  Past Surgical History:  Procedure Laterality Date  . CHOLECYSTECTOMY    . POLYPECTOMY      Social History:  reports that she has quit smoking. She has never used smokeless tobacco. She reports current alcohol use. She reports  that she does not use drugs. Family History:  Family History  Problem Relation Age of Onset  . Arthritis Mother   . Hyperlipidemia Mother   . Hypertension Mother   . Alcohol abuse Father   . Hearing loss Father   . Hyperlipidemia Father      HOME MEDICATIONS: Allergies as of 08/13/2019      Reactions   Lisinopril Cough   Ultram [tramadol] Other (See Comments)   Sulfa Antibiotics Other (See Comments)      Medication List       Accurate as of August 13, 2019  7:26 AM. If you have any questions, ask your nurse or doctor.        baclofen 10 MG tablet Commonly known as: LIORESAL Take 1 tablet (10 mg total) by mouth daily. Start taking on: August 15, 2019   cetirizine 10 MG tablet Commonly known as: ZYRTEC Take 10 mg by mouth daily.   fludrocortisone 0.1 MG tablet Commonly known as: FLORINEF Take 0.5 tablets (0.05 mg total) by mouth daily.   HYDROcodone-acetaminophen 7.5-325 MG tablet Commonly known as: NORCO Take 1 tablet by mouth every 8 (eight) hours as needed for severe pain. Must last 30 days. Start taking on: August 15, 2019   HYDROcodone-acetaminophen 7.5-325 MG tablet Commonly known as: NORCO Take 1 tablet by mouth every 8 (eight) hours as needed for severe pain. Must last 30 days. Start taking on: September 14, 2019   HYDROcodone-acetaminophen  7.5-325 MG tablet Commonly known as: NORCO Take 1 tablet by mouth every 8 (eight) hours as needed for severe pain. Must last 30 days. Start taking on: October 14, 2019   hydrocortisone 10 MG tablet Commonly known as: CORTEF Take 1.5 tablets (15 mg total) by mouth daily with breakfast AND 0.5 tablets (5 mg total) daily.   meloxicam 15 MG tablet Commonly known as: MOBIC Take 1 tablet (15 mg total) by mouth daily. Start taking on: August 15, 2019   pseudoephedrine 30 MG tablet Commonly known as: SUDAFED Take 30 mg by mouth every 4 (four) hours as needed for congestion.         OBJECTIVE:   PHYSICAL  EXAM: VS: LMP 06/08/2018 (Exact Date)    EXAM: General: Pt appears well and is in NAD  Neck: General: Supple without adenopathy. Thyroid: Thyroid size normal.  No goiter or nodules appreciated. No thyroid bruit.  Lungs: Clear with good BS bilat with no rales, rhonchi, or wheezes  Heart: Auscultation: RRR.  Abdomen: Normoactive bowel sounds, soft, nontender, without masses or organomegaly palpable  Extremities:  BL LE: No pretibial edema normal ROM and strength.  Skin: Hair: Texture and amount normal with gender appropriate distribution Skin Inspection: No rashes Skin Palpation: Skin temperature, texture, and thickness normal to palpation  Neuro: Cranial nerves: II - XII grossly intact  Motor: Normal strength throughout DTRs: 2+ and symmetric in UE without delay in relaxation phase  Mental Status: Judgment, insight: Intact Orientation: Oriented to time, place, and person Mood and affect: No depression, anxiety, or agitation     DATA REVIEWED:  Results for FLEURETTE, GRAFFEO (MRN [id number]) as of 08/14/2019 12:19  Ref. Range 08/13/2019 07:50  Sodium Latest Ref Range: 135 - 145 mEq/L 141  Potassium Latest Ref Range: 3.5 - 5.1 mEq/L 3.6  Chloride Latest Ref Range: 96 - 112 mEq/L 106  CO2 Latest Ref Range: 19 - 32 mEq/L 25  Glucose Latest Ref Range: 70 - 99 mg/dL 115 (H)  BUN Latest Ref Range: 6 - 23 mg/dL 16  Creatinine Latest Ref Range: 0.40 - 1.20 mg/dL 0.84  Calcium Latest Ref Range: 8.4 - 10.5 mg/dL 8.9  GFR Latest Ref Range: >60.00 mL/min 72.11  TSH Latest Ref Range: 0.35 - 4.50 uIU/mL 1.92  T4,Free(Direct) Latest Ref Range: 0.60 - 1.60 ng/dL 0.69      ASSESSMENT / PLAN / RECOMMENDATIONS:   1. Primary Adrenal Insufficiency (Addison's Disease)    - Reviewed the sick day rule - She already wears a medical alert bracelet. - Has the 10 mg tablets of HC - BMP normal today  - We discussed switching HC to prednisone for ease of taking , but the patient will feel nauseous with  prednisone - No changes today   Medications : - Hydrocortisone 15 mg QAM - Hydrocortisone 5 mh QPM (between 2-4 pm) - Continue Fludrocortisone  0.1 mg , at half a tablet daily    2. Premature Ovarian Failure :  - Follows with Dr. Nelda Marseille, pending D&C and hystroscopy   F/u in 1 yr    Signed electronically by: Mack Guise, MD  [facility]  [address] [place], [place] [address] Phone: [phone] FAX: [phone]      CC: Elby Beck, [place] [address] Phone: [phone]  Fax: [phone]   Return to Endocrinology clinic as below: Future Appointments  Date Time Provider [place]  08/13/2019  7:30  AM Cristyn Crossno, Melanie Crazier, MD [facility] None  08/14/2019  8:00 AM Elby Beck, FNP [facility]  11/13/2019  8:00 AM Milinda Pointer, MD [facility] None

## 2019-08-14 ENCOUNTER — Encounter: Payer: Self-pay | Admitting: Family Medicine

## 2019-08-14 ENCOUNTER — Ambulatory Visit: Payer: Managed Care, Other (non HMO) | Admitting: Family Medicine

## 2019-08-14 ENCOUNTER — Other Ambulatory Visit: Payer: Self-pay

## 2019-08-14 VITALS — BP 118/62 | HR 84 | Temp 97.8°F | Ht 64.0 in | Wt 187.8 lb

## 2019-08-14 DIAGNOSIS — R21 Rash and other nonspecific skin eruption: Secondary | ICD-10-CM

## 2019-08-14 MED ORDER — MUPIROCIN 2 % EX OINT: 1.0000 "application " | TOPICAL_OINTMENT | Freq: Two times a day (BID) | CUTANEOUS | 1 refills | Status: DC

## 2019-08-14 NOTE — Progress Notes (Signed)
Subjective:    Patient ID: Denise Burns, female    DOB: [birth year], 49 y.o.   MRN: [id number]  HPI Chief Complaint  Patient presents with  . Dermatitis    Rash/bumps around mouth x 8 months. Denies itching. Some soreness. Bumps do not fester up or "come to a head". Pt has been treating with OTC topicals/washes   This is a 49 yo female who presents today with above cc. She has noticed a rash on sides of mouth, left > right, that comes and goes. Bumps appear without drainage. They eventually scab. She has tried salicylic acid wash and benzoyl peroxide without significant improvement. She has been changing her pillow case more frequently. No sores inside mouth.   Past Medical History:  Diagnosis Date  . Addison disease ([place])   . Arthritis   . Back pain   . GERD (gastroesophageal reflux disease)    Past Surgical History:  Procedure Laterality Date  . CHOLECYSTECTOMY    . POLYPECTOMY     Family History  Problem Relation Age of Onset  . Arthritis Mother   . Hyperlipidemia Mother   . Hypertension Mother   . Alcohol abuse Father   . Hearing loss Father   . Hyperlipidemia Father    Social History   Tobacco Use  . Smoking status: Former Research scientist (life sciences)  . Smokeless tobacco: Never Used  Substance Use Topics  . Alcohol use: Yes    Comment: occ  . Drug use: Never      Review of Systems Per HPI    Objective:   Physical Exam Vitals reviewed.  Constitutional:      General: She is not in acute distress.    Appearance: Normal appearance. She is obese. She is not ill-appearing, toxic-appearing or diaphoretic.  HENT:     Head: Normocephalic and atraumatic.     Nose: Nose normal.  Cardiovascular:     Rate and Rhythm: Normal rate.  Pulmonary:     Effort: Pulmonary effort is normal.  Skin:    General: Skin is warm and dry.     Findings: Rash (Left side of mouth with scattered, erythematous papules with tops of lesions appearing excoriated. No drainage, no surrounding erythema. )  present.  Neurological:     Mental Status: She is alert and oriented to person, place, and time.  Psychiatric:        Mood and Affect: Mood normal.        Behavior: Behavior normal.        Thought Content: Thought content normal.        Judgment: Judgment normal.       BP 118/62 (BP Location: Left Arm, Patient Position: Sitting, Cuff Size: Normal)   Pulse 84   Temp 97.8 F (36.6 C) (Temporal)   Ht 5\' 4"  (1.626 m)   Wt 187 lb 12.8 oz (85.2 kg)   LMP 06/08/2018 (Exact Date)   SpO2 97%   BMI 32.24 kg/m  Wt Readings from Last 3 Encounters:  08/14/19 187 lb 12.8 oz (85.2 kg)  08/13/19 186 lb 3.2 oz (84.5 kg)  05/30/19 185 lb (83.9 kg)       Assessment & Plan:  1. Rash - will have her do trial of mupirocin and if no improvement, derm referral - mupirocin ointment (BACTROBAN) 2 %; Apply 1 application topically 2 (two) times daily.  Dispense: 22 g; Refill: 1  This visit occurred during the SARS-CoV-2 public health emergency.  Safety protocols were in place, including screening  questions prior to the visit, additional usage of staff PPE, and extensive cleaning of exam room while observing appropriate contact time as indicated for disinfecting solutions.    Clarene Reamer, FNP-BC  [facility]  08/14/2019 8:27 AM

## 2019-08-14 NOTE — Patient Instructions (Signed)
Good to see you today  I have sent in Mupirocin to your pharmacy, if no improvement in 1-2 weeks, let me know and I will put in a referral to dermatology.

## 2019-08-21 ENCOUNTER — Other Ambulatory Visit: Payer: Self-pay | Admitting: Family Medicine

## 2019-08-21 ENCOUNTER — Encounter: Payer: Self-pay | Admitting: Family Medicine

## 2019-08-21 DIAGNOSIS — R21 Rash and other nonspecific skin eruption: Secondary | ICD-10-CM

## 2019-09-09 ENCOUNTER — Telehealth: Payer: Self-pay | Admitting: Pain Medicine

## 2019-09-09 ENCOUNTER — Encounter: Payer: Self-pay | Admitting: Family Medicine

## 2019-09-09 NOTE — Telephone Encounter (Signed)
Patient called stating she gets meds at [facility]. It is due to be filled on Saturday and they are closed on Saturday. The pharmacy told her they would fill it on Friday if our office calls them and tells them it is ok to do so. Please call pharmacy to fill scripts for patient on Friday.

## 2019-09-09 NOTE — Telephone Encounter (Signed)
Done

## 2019-11-07 ENCOUNTER — Other Ambulatory Visit: Payer: Self-pay

## 2019-11-07 ENCOUNTER — Ambulatory Visit: Payer: Managed Care, Other (non HMO) | Attending: Pain Medicine | Admitting: Pain Medicine

## 2019-11-07 DIAGNOSIS — M4300 Spondylolysis, site unspecified: Secondary | ICD-10-CM | POA: Diagnosis not present

## 2019-11-07 DIAGNOSIS — M4317 Spondylolisthesis, lumbosacral region: Secondary | ICD-10-CM

## 2019-11-07 DIAGNOSIS — M47816 Spondylosis without myelopathy or radiculopathy, lumbar region: Secondary | ICD-10-CM | POA: Diagnosis not present

## 2019-11-07 DIAGNOSIS — M545 Low back pain, unspecified: Secondary | ICD-10-CM

## 2019-11-07 DIAGNOSIS — M4306 Spondylolysis, lumbar region: Secondary | ICD-10-CM

## 2019-11-07 DIAGNOSIS — M4310 Spondylolisthesis, site unspecified: Secondary | ICD-10-CM

## 2019-11-07 DIAGNOSIS — G8929 Other chronic pain: Secondary | ICD-10-CM

## 2019-11-07 DIAGNOSIS — G894 Chronic pain syndrome: Secondary | ICD-10-CM

## 2019-11-07 DIAGNOSIS — M5137 Other intervertebral disc degeneration, lumbosacral region: Secondary | ICD-10-CM | POA: Diagnosis not present

## 2019-11-07 MED ORDER — HYDROCODONE-ACETAMINOPHEN 7.5-325 MG PO TABS: 1.0000 | ORAL_TABLET | Freq: Three times a day (TID) | ORAL | 0 refills | Status: DC | PRN

## 2019-11-07 NOTE — Patient Instructions (Signed)
____________________________________________________________________________________________  Preparing for Procedure with Sedation  Procedure appointments are limited to planned procedures: . No Prescription Refills. . No disability issues will be discussed. . No medication changes will be discussed.  Instructions: . Oral Intake: Do not eat or drink anything for at least 3 hours prior to your procedure. (Exception: Blood Pressure Medication. See below.) . Transportation: Unless otherwise stated by your physician, you may drive yourself after the procedure. . Blood Pressure Medicine: Do not forget to take your blood pressure medicine with a sip of water the morning of the procedure. If your Diastolic (lower reading)is above 100 mmHg, elective cases will be cancelled/rescheduled. . Blood thinners: These will need to be stopped for procedures. Notify our staff if you are taking any blood thinners. Depending on which one you take, there will be specific instructions on how and when to stop it. . Diabetics on insulin: Notify the staff so that you can be scheduled 1st case in the morning. If your diabetes requires high dose insulin, take only  of your normal insulin dose the morning of the procedure and notify the staff that you have done so. . Preventing infections: Shower with an antibacterial soap the morning of your procedure. . Build-up your immune system: Take 1000 mg of Vitamin C with every meal (3 times a day) the day prior to your procedure. . Antibiotics: Inform the staff if you have a condition or reason that requires you to take antibiotics before dental procedures. . Pregnancy: If you are pregnant, call and cancel the procedure. . Sickness: If you have a cold, fever, or any active infections, call and cancel the procedure. . Arrival: You must be in the facility at least 30 minutes prior to your scheduled procedure. . Children: Do not bring children with you. . Dress appropriately:  Bring dark clothing that you would not mind if they get stained. . Valuables: Do not bring any jewelry or valuables.  Reasons to call and reschedule or cancel your procedure: (Following these recommendations will minimize the risk of a serious complication.) . Surgeries: Avoid having procedures within 2 weeks of any surgery. (Avoid for 2 weeks before or after any surgery). . Flu Shots: Avoid having procedures within 2 weeks of a flu shots or . (Avoid for 2 weeks before or after immunizations). . Barium: Avoid having a procedure within 7-10 days after having had a radiological study involving the use of radiological contrast. (Myelograms, Barium swallow or enema study). . Heart attacks: Avoid any elective procedures or surgeries for the initial 6 months after a "Myocardial Infarction" (Heart Attack). . Blood thinners: It is imperative that you stop these medications before procedures. Let us know if you if you take any blood thinner.  . Infection: Avoid procedures during or within two weeks of an infection (including chest colds or gastrointestinal problems). Symptoms associated with infections include: Localized redness, fever, chills, night sweats or profuse sweating, burning sensation when voiding, cough, congestion, stuffiness, runny nose, sore throat, diarrhea, nausea, vomiting, cold or Flu symptoms, recent or current infections. It is specially important if the infection is over the area that we intend to treat. . Heart and lung problems: Symptoms that may suggest an active cardiopulmonary problem include: cough, chest pain, breathing difficulties or shortness of breath, dizziness, ankle swelling, uncontrolled high or unusually low blood pressure, and/or palpitations. If you are experiencing any of these symptoms, cancel your procedure and contact your primary care physician for an evaluation.  Remember:  Regular Business hours are:    Monday to Thursday 8:00 AM to 4:00 PM  Provider's  Schedule: Remas Sobel, MD:  Procedure days: Tuesday and Thursday 7:30 AM to 4:00 PM  Bilal Lateef, MD:  Procedure days: Monday and Wednesday 7:30 AM to 4:00 PM ____________________________________________________________________________________________    

## 2019-11-07 NOTE — Progress Notes (Signed)
Patient: Denise Burns  Service Category: E/M  Provider: [name], MD  DOB: Jan 09, 1971  DOS: 11/07/2019  Location: Office  MRN: [id number]  Setting: Ambulatory outpatient  Referring Provider: Elby Beck, FNP  Type: Established Patient  Specialty: Interventional Pain Management  PCP: Elby Beck, FNP  Location: Remote location  Delivery: TeleHealth     Virtual Encounter - Pain Management PROVIDER NOTE: Information contained herein reflects review and annotations entered in association with encounter. Interpretation of such information and data should be left to medically-trained personnel. Information provided to patient can be located elsewhere in the medical record under "Patient Instructions". Document created using STT-dictation technology, any transcriptional errors that may result from process are unintentional.    Contact & Pharmacy Preferred: [phone] Home: [phone] (home) Mobile: [phone] (mobile) E-mail: [email or web address]  [facility], [address] [place] [address] Phone: [phone] Fax: [phone]   Pre-screening  [name] offered "in-person" vs "virtual" encounter. She indicated preferring virtual for this encounter.   Reason COVID-19*  Social distancing based on CDC and AMA recommendations.   I contacted Denise Burns on 11/07/2019 via telephone.      I clearly identified myself as [name], MD. I verified that I was speaking with the correct person using two identifiers (Name: Denise Burns, and date of birth: 10-Oct-1970).  Consent I sought verbal advanced consent from Denise Burns for virtual visit interactions. I informed Denise Burns of possible security and privacy concerns, risks, and limitations associated with providing "not-in-person" medical evaluation and management services. I also informed Denise Burns of the availability of "in-person"  appointments. Finally, I informed her that there would be a charge for the virtual visit and that she could be  personally, fully or partially, financially responsible for it. Denise Burns expressed understanding and agreed to proceed.   Historic Elements   Denise Burns is a 49 y.o. year old, female patient evaluated today after her last contact with our practice on 09/09/2019. Denise Burns  has a past medical history of Addison disease ([place]), Arthritis, Back pain, and GERD (gastroesophageal reflux disease). She also  has a past surgical history that includes Cholecystectomy and Polypectomy. Denise Burns has a current medication list which includes the following prescription(s): baclofen, cetirizine, fludrocortisone, [START ON 11/13/2019] hydrocodone-acetaminophen, [START ON 12/13/2019] hydrocodone-acetaminophen, [START ON 01/12/2020] hydrocodone-acetaminophen, hydrocortisone, meloxicam, and pseudoephedrine. She  reports that she has quit smoking. She has never used smokeless tobacco. She reports current alcohol use. She reports that she does not use drugs. Denise Burns is allergic to lisinopril; ultram [tramadol]; and sulfa antibiotics.   HPI  Today, she is being contacted for medication management. The patient indicates doing well with the current medication regimen. No adverse reactions or side effects reported to the medications.  The patient indicates having done really well with the 2 bilateral lumbar facet blocks that she has had done.  The first 1 was done on 01/17/2019 and it provided her with complete relief of the pain that lasted for almost 4 months.  The second 1 was done on 05/16/2019 and a has provided her with similar benefits, but she indicates that the pain is beginning to come back and she would like to have that one repeated.  Clearly they are providing her with significant benefit and for a prolonged period of time and therefore I believe it to be medically necessary and indicated to have  the bilateral lumbar facet  block repeated.  Pharmacotherapy Assessment  Analgesic: Hydrocodone/APAP 7.5/325 1 tablet p.o. 3 times daily (22.5 mg/day of hydrocodone) (should have enough until 08/15/2019) MME/day:22.27m/day.   Monitoring: [place] PMP: PDMP reviewed during this encounter.       Pharmacotherapy: No side-effects or adverse reactions reported. Compliance: No problems identified. Effectiveness: Clinically acceptable. Plan: Refer to "POC".  UDS:  Summary  Date Value Ref Range Status  08/16/2018 FINAL  Final    Comment:    ==================================================================== TOXASSURE COMP DRUG ANALYSIS,UR ==================================================================== Test                             Result       Flag       Units Drug Present and Declared for Prescription Verification   Hydrocodone                    205          EXPECTED   ng/mg creat   Hydromorphone                  671          EXPECTED   ng/mg creat   Dihydrocodeine                 153          EXPECTED   ng/mg creat   Norhydrocodone                 642          EXPECTED   ng/mg creat    Sources of hydrocodone include scheduled prescription    medications. Hydromorphone, dihydrocodeine and norhydrocodone are    expected metabolites of hydrocodone. Hydromorphone and    dihydrocodeine are also available as scheduled prescription    medications.   Ephedrine/Pseudoephedrine      PRESENT      EXPECTED   Phenylpropanolamine            PRESENT      EXPECTED    Source of ephedrine/pseudoephedrine is most commonly    pseudoephedrine in over-the-counter or prescription cold and    allergy medications. Phenylpropanolamine is an expected    metabolite of ephedrine/pseudoephedrine.   Baclofen                       PRESENT      EXPECTED   Acetaminophen                  PRESENT      EXPECTED Drug Present not Declared for Prescription Verification   Ibuprofen                      PRESENT       UNEXPECTED   Doxylamine                     PRESENT      UNEXPECTED   Dextrorphan/Levorphanol        PRESENT      UNEXPECTED    Dextrorphan is an expected metabolite of dextromethorphan, an    over-the-counter or prescription cough suppressant. Levorphanol    is a scheduled prescription medication. Dextrorphan cannot be    distinguished from levorphanol by the method used for analysis. ==================================================================== Test  Result    Flag   Units      Ref Range   Creatinine              234              mg/dL      >=20 ==================================================================== Declared Medications:  The flagging and interpretation on this report are based on the  following declared medications.  Unexpected results may arise from  inaccuracies in the declared medications.  **Note: The testing scope of this panel includes these medications:  Baclofen  Hydrocodone (Hydrocodone-Acetaminophen)  Pseudoephedrine (Sudafed)  **Note: The testing scope of this panel does not include small to  moderate amounts of these reported medications:  Acetaminophen (Hydrocodone-Acetaminophen)  **Note: The testing scope of this panel does not include following  reported medications:  Cholecalciferol  Fexofenadine  Fludrocortisone (Florinef)  Hydrocortisone (Cortef) ==================================================================== For clinical consultation, please call [phone]. ====================================================================    Laboratory Chemistry Profile   Renal Lab Results  Component Value Date   BUN 16 08/13/2019   CREATININE 0.84 08/13/2019   BCR 18 08/16/2018   GFR 72.11 08/13/2019   GFRAA 86 08/16/2018   GFRNONAA 74 08/16/2018     Hepatic Lab Results  Component Value Date   AST 18 08/16/2018   ALBUMIN 4.4 08/16/2018   ALKPHOS 64 08/16/2018     Electrolytes Lab Results  Component  Value Date   NA 141 08/13/2019   K 3.6 08/13/2019   CL 106 08/13/2019   CALCIUM 8.9 08/13/2019   MG 2.1 08/16/2018     Bone Lab Results  Component Value Date   VD25OH 13.55 (L) 04/30/2018   25OHVITD1 67 08/16/2018   25OHVITD2 <1.0 08/16/2018   25OHVITD3 67 08/16/2018     Inflammation (CRP: Acute Phase) (ESR: Chronic Phase) Lab Results  Component Value Date   CRP 2 08/16/2018   ESRSEDRATE 37 (H) 08/16/2018       Note: Above Lab results reviewed.  Imaging  DG PAIN CLINIC C-ARM 1-60 MIN NO REPORT Fluoro was used, but no Radiologist interpretation will be provided.  Please refer to "NOTES" tab for provider progress note.  Assessment  The primary encounter diagnosis was Chronic low back pain (Primary Area of Pain) (Bilateral) (R>L) w/o sciatica. Diagnoses of Lumbar facet syndrome (Bilateral), L5-S1 pars defect w/ spondylolisthesis (Bilateral), DDD (degenerative disc disease), lumbosacral, Lumbar pars defect (L5-S1) (Bilateral), Spondylolisthesis (8 mm) at L5-S1 level, and Chronic pain syndrome were also pertinent to this visit.  Plan of Care  Problem-specific:  No problem-specific Assessment & Plan notes found for this encounter.  Denise Burns has a current medication list which includes the following long-term medication(s): baclofen, cetirizine, [START ON 11/13/2019] hydrocodone-acetaminophen, [START ON 12/13/2019] hydrocodone-acetaminophen, [START ON 01/12/2020] hydrocodone-acetaminophen, and meloxicam.  Pharmacotherapy (Medications Ordered): Meds ordered this encounter  Medications  . HYDROcodone-acetaminophen (NORCO) 7.5-325 MG tablet    Sig: Take 1 tablet by mouth every 8 (eight) hours as needed for severe pain. Must last 30 days.    Dispense:  90 tablet    Refill:  0    Chronic Pain: STOP Act (Not applicable) Fill 1 day early if closed on refill date. Do not fill until: 11/13/2019. To last until: 12/13/2019. Avoid benzodiazepines within 8 hours of opioids  .  HYDROcodone-acetaminophen (NORCO) 7.5-325 MG tablet    Sig: Take 1 tablet by mouth every 8 (eight) hours as needed for severe pain. Must last 30 days.    Dispense:  90 tablet  Refill:  0    Chronic Pain: STOP Act (Not applicable) Fill 1 day early if closed on refill date. Do not fill until: 12/13/2019. To last until: 01/12/2020. Avoid benzodiazepines within 8 hours of opioids  . HYDROcodone-acetaminophen (NORCO) 7.5-325 MG tablet    Sig: Take 1 tablet by mouth every 8 (eight) hours as needed for severe pain. Must last 30 days.    Dispense:  90 tablet    Refill:  0    Chronic Pain: STOP Act (Not applicable) Fill 1 day early if closed on refill date. Do not fill until: 01/12/2020. To last until: 02/11/2020. Avoid benzodiazepines within 8 hours of opioids   Orders:  Orders Placed This Encounter  Procedures  . LUMBAR FACET(MEDIAL BRANCH NERVE BLOCK) MBNB    Standing Status:   Future    Standing Expiration Date:   12/07/2019    Scheduling Instructions:     Procedure: Lumbar facet block (AKA.: Lumbosacral medial branch nerve block)     Side: Bilateral     Level: L3-4, L4-5, & L5-S1 Facets (L2, L3, L4, L5, & S1 Medial Branch Nerves)     Sedation: Patient's choice.     Timeframe: ASAA    Order Specific Question:   Where will this procedure be performed?    Answer:   [facility]   Follow-up plan:   Return in about 3 months (around 02/10/2020) for (F2F), (MM), in addition, Procedure (w/ sedation): (B) L-FCT Blk #3.      Considering: Therapeutic bilateral lumbar facetRFA #1(starting with the right side) Tinsman joint Blk PossiblebilateralSI joint RFA Diagnostic bilateral L5 TFESI Diagnostic left-sided L2 TFESI   PRN Procedures: Diagnostic bilateral lumbar facet nerve block #3    Recent Visits Date Type Provider Dept  08/12/19 Telemedicine Milinda Pointer, MD [facility]  Showing recent visits within past 90 days and meeting all other  requirements   Today's Visits Date Type Provider Dept  11/07/19 Telemedicine Milinda Pointer, MD [facility]  Showing today's visits and meeting all other requirements   Future Appointments No visits were found meeting these conditions.  Showing future appointments within next 90 days and meeting all other requirements   I discussed the assessment and treatment plan with the patient. The patient was provided an opportunity to ask questions and all were answered. The patient agreed with the plan and demonstrated an understanding of the instructions.  Patient advised to call back or seek an in-person evaluation if the symptoms or condition worsens.  Duration of encounter: 15 minutes.  Note by: [name], MD Date: 11/07/2019; Time: 2:27 PM

## 2019-11-12 ENCOUNTER — Telehealth: Payer: Self-pay | Admitting: Pain Medicine

## 2019-11-12 NOTE — Telephone Encounter (Signed)
I called [facility] pharmacy, left voicemail authorizing early refill of 1 day. Called patient and informed her of this, and that medication must last 30 days until next date to be filled.

## 2019-11-12 NOTE — Telephone Encounter (Signed)
Patient has to be at a conference for work tomorrow and meds are due to be filled tomorrow. Can she pick meds up today? Pharmacy will need a call from nurse to fill today. Please let patient know

## 2019-11-13 ENCOUNTER — Telehealth: Payer: Managed Care, Other (non HMO) | Admitting: Pain Medicine

## 2019-12-02 ENCOUNTER — Telehealth: Payer: Self-pay

## 2019-12-02 NOTE — Telephone Encounter (Signed)
Schedule a virtual visit with Dr Dossie Arbour to discuss options.

## 2019-12-02 NOTE — Telephone Encounter (Signed)
Insurance denied her facets for the second time. SHe wants to know what to do from here.

## 2019-12-11 ENCOUNTER — Telehealth: Payer: Self-pay | Admitting: *Deleted

## 2019-12-11 NOTE — Telephone Encounter (Signed)
Voicemail left for patient to please call to review medications prior to VV. 

## 2019-12-12 ENCOUNTER — Ambulatory Visit: Payer: Managed Care, Other (non HMO) | Attending: Pain Medicine | Admitting: Pain Medicine

## 2019-12-12 ENCOUNTER — Other Ambulatory Visit: Payer: Self-pay

## 2019-12-12 DIAGNOSIS — M47816 Spondylosis without myelopathy or radiculopathy, lumbar region: Secondary | ICD-10-CM | POA: Diagnosis not present

## 2019-12-12 DIAGNOSIS — G8929 Other chronic pain: Secondary | ICD-10-CM | POA: Diagnosis not present

## 2019-12-12 DIAGNOSIS — M545 Low back pain, unspecified: Secondary | ICD-10-CM

## 2019-12-12 DIAGNOSIS — G894 Chronic pain syndrome: Secondary | ICD-10-CM

## 2019-12-12 NOTE — Patient Instructions (Signed)
____________________________________________________________________________________________  Preparing for Procedure with Sedation  Procedure appointments are limited to planned procedures: . No Prescription Refills. . No disability issues will be discussed. . No medication changes will be discussed.  Instructions: . Oral Intake: Do not eat or drink anything for at least 3 hours prior to your procedure. (Exception: Blood Pressure Medication. See below.) . Transportation: Unless otherwise stated by your physician, you may drive yourself after the procedure. . Blood Pressure Medicine: Do not forget to take your blood pressure medicine with a sip of water the morning of the procedure. If your Diastolic (lower reading)is above 100 mmHg, elective cases will be cancelled/rescheduled. . Blood thinners: These will need to be stopped for procedures. Notify our staff if you are taking any blood thinners. Depending on which one you take, there will be specific instructions on how and when to stop it. . Diabetics on insulin: Notify the staff so that you can be scheduled 1st case in the morning. If your diabetes requires high dose insulin, take only  of your normal insulin dose the morning of the procedure and notify the staff that you have done so. . Preventing infections: Shower with an antibacterial soap the morning of your procedure. . Build-up your immune system: Take 1000 mg of Vitamin C with every meal (3 times a day) the day prior to your procedure. . Antibiotics: Inform the staff if you have a condition or reason that requires you to take antibiotics before dental procedures. . Pregnancy: If you are pregnant, call and cancel the procedure. . Sickness: If you have a cold, fever, or any active infections, call and cancel the procedure. . Arrival: You must be in the facility at least 30 minutes prior to your scheduled procedure. . Children: Do not bring children with you. . Dress appropriately:  Bring dark clothing that you would not mind if they get stained. . Valuables: Do not bring any jewelry or valuables.  Reasons to call and reschedule or cancel your procedure: (Following these recommendations will minimize the risk of a serious complication.) . Surgeries: Avoid having procedures within 2 weeks of any surgery. (Avoid for 2 weeks before or after any surgery). . Flu Shots: Avoid having procedures within 2 weeks of a flu shots or . (Avoid for 2 weeks before or after immunizations). . Barium: Avoid having a procedure within 7-10 days after having had a radiological study involving the use of radiological contrast. (Myelograms, Barium swallow or enema study). . Heart attacks: Avoid any elective procedures or surgeries for the initial 6 months after a "Myocardial Infarction" (Heart Attack). . Blood thinners: It is imperative that you stop these medications before procedures. Let us know if you if you take any blood thinner.  . Infection: Avoid procedures during or within two weeks of an infection (including chest colds or gastrointestinal problems). Symptoms associated with infections include: Localized redness, fever, chills, night sweats or profuse sweating, burning sensation when voiding, cough, congestion, stuffiness, runny nose, sore throat, diarrhea, nausea, vomiting, cold or Flu symptoms, recent or current infections. It is specially important if the infection is over the area that we intend to treat. . Heart and lung problems: Symptoms that may suggest an active cardiopulmonary problem include: cough, chest pain, breathing difficulties or shortness of breath, dizziness, ankle swelling, uncontrolled high or unusually low blood pressure, and/or palpitations. If you are experiencing any of these symptoms, cancel your procedure and contact your primary care physician for an evaluation.  Remember:  Regular Business hours are:    Monday to Thursday 8:00 AM to 4:00 PM  Provider's  Schedule: Janai Maudlin, MD:  Procedure days: Tuesday and Thursday 7:30 AM to 4:00 PM  Bilal Lateef, MD:  Procedure days: Monday and Wednesday 7:30 AM to 4:00 PM ____________________________________________________________________________________________    

## 2019-12-12 NOTE — Progress Notes (Signed)
Patient: Denise Burns  Service Category: E/M  Provider: [name], MD  DOB: [birth year]  DOS: 12/12/2019  Location: Office  MRN: [id number]  Setting: Ambulatory outpatient  Referring Provider: Elby Beck, FNP  Type: Established Patient  Specialty: Interventional Pain Management  PCP: Elby Beck, FNP  Location: Remote location  Delivery: TeleHealth     Virtual Encounter - Pain Management PROVIDER NOTE: Information contained herein reflects review and annotations entered in association with encounter. Interpretation of such information and data should be left to medically-trained personnel. Information provided to patient can be located elsewhere in the medical record under "Patient Instructions". Document created using STT-dictation technology, any transcriptional errors that may result from process are unintentional.    Contact & Pharmacy Preferred: [phone] Home: [phone] (home) Mobile: [phone] (mobile) E-mail: [email or web address]  [place], [place] [address] Phone: [phone] Fax: [phone]   Pre-screening  [name] offered "in-person" vs "virtual" encounter. She indicated preferring virtual for this encounter.   Reason COVID-19*  Social distancing based on CDC and AMA recommendations.   I contacted Siriyah Ambrosius on 12/12/2019 via telephone.      I clearly identified myself as [name], MD. I verified that I was speaking with the correct person using two identifiers (Name: Denise Burns, and date of birth: August 03, 1970).  Consent I sought verbal advanced consent from Denise Burns for virtual visit interactions. I informed Denise Burns of possible security and privacy concerns, risks, and limitations associated with providing "not-in-person" medical evaluation and management services. I also informed Denise Burns of the availability of "in-person"  appointments. Finally, I informed her that there would be a charge for the virtual visit and that she could be  personally, fully or partially, financially responsible for it. Denise Burns expressed understanding and agreed to proceed.   Historic Elements   Denise Burns is a 49 y.o. year old, female patient evaluated today after her last contact with our practice on 12/11/2019. Denise Burns  has a past medical history of Addison disease ([place]), Arthritis, Back pain, and GERD (gastroesophageal reflux disease). She also  has a past surgical history that includes Cholecystectomy and Polypectomy. Denise Burns has a current medication list which includes the following prescription(s): baclofen, cetirizine, fludrocortisone, hydrocodone-acetaminophen, [START ON 12/13/2019] hydrocodone-acetaminophen, [START ON 01/12/2020] hydrocodone-acetaminophen, hydrocortisone, meloxicam, and pseudoephedrine. She  reports that she has quit smoking. She has never used smokeless tobacco. She reports current alcohol use. She reports that she does not use drugs. Denise Burns is allergic to lisinopril; ultram [tramadol]; and sulfa antibiotics.   HPI  Today, she is being contacted for follow-up evaluation to determine what the next step would be since her insurance company denied a third diagnostic lumbar facet block.  In any case, it is not necessary for [place] to do that third injection since the patient had obtained excellent benefits from the first 2.  However, she was simply not sure about the radiofrequency, but now that her insurance company has denied the facet blocks, now it is easy for her to the side since there are no other options.  Today I talked to the patient about the radiofrequency and what it consist of as well as what the risk and possible complications are.  I have reviewed her x-rays from the last procedure and she has no hardware or any other problems that would contraindicate the radiofrequency ablation.  Once I  explained to  her what it consisted of, she decided that she wanted to proceed with it.  She has elected to have the right side done first, followed by the left 2 weeks later.  Post-Procedure Evaluation  Procedure (05/16/2019): Diagnostic bilateral lumbar facet block #2 under fluoroscopic guidance and IV sedation Pre-procedure pain level:  3/10 Post-procedure: 0/10 (100% relief)  Sedation: Sedation provided.  Effectiveness during initial hour after procedure(Ultra-Short Term Relief):   100%  Local anesthetic used: Long-acting (4-6 hours) Effectiveness: Defined as any analgesic benefit obtained secondary to the administration of local anesthetics. This carries significant diagnostic value as to the etiological location, or anatomical origin, of the pain. Duration of benefit is expected to coincide with the duration of the local anesthetic used.  Effectiveness during initial 4-6 hours after procedure(Short-Term Relief):   100%  Long-term benefit: Defined as any relief past the pharmacologic duration of the local anesthetics.  Effectiveness past the initial 6 hours after procedure(Long-Term Relief):   100%  Benefits observed:        Analgesia:  > 50% improved Function: Denise Burns reported improvement in function ROM: Denise Burns reported improvement in ROM  Medical Necessity: Denise Burns has experienced debilitating chronic pain from the Lumbosacral Facet Syndrome (Spondylosis without myelopathy or radiculopathy, lumbosacral region [M47.817]) that has persisted for longer than three months of failed non-surgical care and has either failed to respond, or was unable to tolerate, or simply did not get enough benefit from other more conservative therapies including, but not limited to: 1. Over-the-counter oral analgesic medications (i.e.: ibuprofen, naproxen, etc.) 2. Anti-inflammatory medications 3. Muscle relaxants 4. Membrane stabilizers 5. Opioids 6. Physical therapy (PT), chiropractic  manipulation, and/or home exercise program (HEP). 7. Modalities (Heat, ice, etc.) 8. Invasive techniques such as nerve blocks.  Denise Burns has attained greater than 50% reduction in pain from at least two (2) diagnostic medial branch blocks conducted in separate occasions. For this reason, I believe it is medically necessary to proceed with Non-Pulsed Radiofrequency Ablation for the purpose of attempting to prolong the duration of the benefits seen with the diagnostic injections.  Pharmacotherapy Assessment  Analgesic: Hydrocodone/APAP 7.5/325 1 tablet p.o. 3 times daily (22.5 mg/day of hydrocodone)  MME/day:22.44m/day.   Monitoring: [place] PMP: PDMP reviewed during this encounter.       Pharmacotherapy: No side-effects or adverse reactions reported. Compliance: No problems identified. Effectiveness: Clinically acceptable. Plan: Refer to "POC".  UDS:  Summary  Date Value Ref Range Status  08/16/2018 FINAL  Final    Comment:    ==================================================================== TOXASSURE COMP DRUG ANALYSIS,UR ==================================================================== Test                             Result       Flag       Units Drug Present and Declared for Prescription Verification   Hydrocodone                    205          EXPECTED   ng/mg creat   Hydromorphone                  671          EXPECTED   ng/mg creat   Dihydrocodeine                 153          EXPECTED   ng/mg creat   Norhydrocodone  642          EXPECTED   ng/mg creat    Sources of hydrocodone include scheduled prescription    medications. Hydromorphone, dihydrocodeine and norhydrocodone are    expected metabolites of hydrocodone. Hydromorphone and    dihydrocodeine are also available as scheduled prescription    medications.   Ephedrine/Pseudoephedrine      PRESENT      EXPECTED   Phenylpropanolamine            PRESENT      EXPECTED    Source of  ephedrine/pseudoephedrine is most commonly    pseudoephedrine in over-the-counter or prescription cold and    allergy medications. Phenylpropanolamine is an expected    metabolite of ephedrine/pseudoephedrine.   Baclofen                       PRESENT      EXPECTED   Acetaminophen                  PRESENT      EXPECTED Drug Present not Declared for Prescription Verification   Ibuprofen                      PRESENT      UNEXPECTED   Doxylamine                     PRESENT      UNEXPECTED   Dextrorphan/Levorphanol        PRESENT      UNEXPECTED    Dextrorphan is an expected metabolite of dextromethorphan, an    over-the-counter or prescription cough suppressant. Levorphanol    is a scheduled prescription medication. Dextrorphan cannot be    distinguished from levorphanol by the method used for analysis. ==================================================================== Test                      Result    Flag   Units      Ref Range   Creatinine              234              mg/dL      >=20 ==================================================================== Declared Medications:  The flagging and interpretation on this report are based on the  following declared medications.  Unexpected results may arise from  inaccuracies in the declared medications.  **Note: The testing scope of this panel includes these medications:  Baclofen  Hydrocodone (Hydrocodone-Acetaminophen)  Pseudoephedrine (Sudafed)  **Note: The testing scope of this panel does not include small to  moderate amounts of these reported medications:  Acetaminophen (Hydrocodone-Acetaminophen)  **Note: The testing scope of this panel does not include following  reported medications:  Cholecalciferol  Fexofenadine  Fludrocortisone (Florinef)  Hydrocortisone (Cortef) ==================================================================== For clinical consultation, please call [phone]. ====================================================================    Laboratory Chemistry Profile   Renal Lab Results  Component Value Date   BUN 16 08/13/2019   CREATININE 0.84 08/13/2019   BCR 18 08/16/2018   GFR 72.11 08/13/2019   GFRAA 86 08/16/2018   GFRNONAA 74 08/16/2018     Hepatic Lab Results  Component Value Date   AST 18 08/16/2018   ALBUMIN 4.4 08/16/2018   ALKPHOS 64 08/16/2018     Electrolytes Lab Results  Component Value Date   NA 141 08/13/2019   K 3.6 08/13/2019   CL 106 08/13/2019   CALCIUM 8.9 08/13/2019  MG 2.1 08/16/2018     Bone Lab Results  Component Value Date   VD25OH 13.55 (L) 04/30/2018   25OHVITD1 67 08/16/2018   25OHVITD2 <1.0 08/16/2018   25OHVITD3 67 08/16/2018     Inflammation (CRP: Acute Phase) (ESR: Chronic Phase) Lab Results  Component Value Date   CRP 2 08/16/2018   ESRSEDRATE 37 (H) 08/16/2018       Note: Above Lab results reviewed.  Imaging  DG PAIN CLINIC C-ARM 1-60 MIN NO REPORT Fluoro was used, but no Radiologist interpretation will be provided.  Please refer to "NOTES" tab for provider progress note.  Assessment  The primary encounter diagnosis was Chronic pain syndrome. Diagnoses of Chronic low back pain (Primary Area of Pain) (Bilateral) (R>L) w/o sciatica and Lumbar facet syndrome (Bilateral) were also pertinent to this visit.  Plan of Care  Problem-specific:  No problem-specific Assessment & Plan notes found for this encounter.  Ms. Levada Bowersox has a current medication list which includes the following long-term medication(s): baclofen, cetirizine, hydrocodone-acetaminophen, [START ON 12/13/2019] hydrocodone-acetaminophen, [START ON 01/12/2020] hydrocodone-acetaminophen, and meloxicam.  Pharmacotherapy (Medications Ordered): No orders of the defined types were placed in this encounter.  Orders:  Orders Placed This Encounter  Procedures  . Radiofrequency,Lumbar    Standing Status:    Future    Standing Expiration Date:   06/13/2021    Scheduling Instructions:     Side(s): Right-sided     Level: L3-4, L4-5, & L5-S1 Facets (L2, L3, L4, L5, & S1 Medial Branch Nerves)     Sedation: With Sedation     Scheduling Timeframe: As soon as pre-approved    Order Specific Question:   Where will this procedure be performed?    Answer:   [facility]   Follow-up plan:   Return for RFA (w/ sedation): (R) L-FCT RFA #1, (ASAP).      Considering: Therapeutic bilateral lumbar facetRFA #1(starting with the right side) [place] joint Blk PossiblebilateralSI joint RFA Diagnostic bilateral L5 TFESI Diagnostic left-sided L2 TFESI   PRN Procedures: Palliative bilateral lumbar facet block #3    Recent Visits Date Type Provider Dept  11/07/19 Telemedicine Milinda Pointer, MD [facility]  Showing recent visits within past 90 days and meeting all other requirements   Today's Visits Date Type Provider Dept  12/12/19 Telemedicine Milinda Pointer, MD [facility]  Showing today's visits and meeting all other requirements   Future Appointments Date Type Provider Dept  02/11/20 Appointment Milinda Pointer, MD [facility]  Showing future appointments within next 90 days and meeting all other requirements   I discussed the assessment and treatment plan with the patient. The patient was provided an opportunity to ask questions and all were answered. The patient agreed with the plan and demonstrated an understanding of the instructions.  Patient advised to call back or seek an in-person evaluation if the symptoms or condition worsens.  Duration of encounter: 15 minutes.  Note by: [name], MD Date: 12/12/2019; Time: 3:17 PM

## 2019-12-16 ENCOUNTER — Encounter: Payer: Self-pay | Admitting: Family Medicine

## 2019-12-16 ENCOUNTER — Other Ambulatory Visit: Payer: Self-pay

## 2019-12-16 ENCOUNTER — Ambulatory Visit: Payer: Managed Care, Other (non HMO) | Admitting: Family Medicine

## 2019-12-16 VITALS — BP 144/92 | HR 93 | Temp 98.6°F | Wt 181.0 lb

## 2019-12-16 DIAGNOSIS — K645 Perianal venous thrombosis: Secondary | ICD-10-CM | POA: Diagnosis not present

## 2019-12-16 NOTE — Progress Notes (Signed)
   Subjective:    Patient ID: Denise Burns, female    DOB: 1971-02-25, 49 y.o.   MRN: [id number]  HPI Chief Complaint  Patient presents with  . Hemorrhoids    x 4 days. Some rectal bleeding noticed today. Painful, swelling. Pt denies being recently constipated, no straining with an BMs.    This is a 49 yo female who presents today with above cc. Has used Tucks with no relief. Has recently gone to plant based diet and wonders if increases fiber was trigger. Noticed 4 days ago, today with a little relief, feels like it has opened up a little. Some blood with wiping. Has been several years since last flare.    Review of Systems Per HPI    Objective:   Physical Exam Vitals reviewed. Exam conducted with a chaperone present.  Constitutional:      General: She is not in acute distress.    Appearance: Normal appearance. She is obese. She is not ill-appearing, toxic-appearing or diaphoretic.  HENT:     Head: Normocephalic and atraumatic.  Cardiovascular:     Rate and Rhythm: Normal rate.  Pulmonary:     Effort: Pulmonary effort is normal.  Genitourinary:    Exam position: Lithotomy position.     Rectum: External hemorrhoid (light purple, small amoutn blood with expression, soft. ) present.  Skin:    General: Skin is warm and dry.  Neurological:     Mental Status: She is alert and oriented to person, place, and time.  Psychiatric:        Mood and Affect: Mood normal.        Behavior: Behavior normal.        Thought Content: Thought content normal.        Judgment: Judgment normal.          BP (!) 144/92 (BP Location: Left Arm, Patient Position: Sitting, Cuff Size: Normal)   Pulse 93   Temp 98.6 F (37 C) (Temporal)   Wt 181 lb (82.1 kg)   LMP 06/08/2018 (Exact Date)   SpO2 99%   BMI 31.07 kg/m  Wt Readings from Last 3 Encounters:  12/16/19 181 lb (82.1 kg)  08/14/19 187 lb 12.8 oz (85.2 kg)  08/13/19 186 lb 3.2 oz (84.5 kg)    Assessment & Plan:  1. Thrombosed  hemorrhoids - Provided written and verbal information regarding diagnosis and treatment. - Suggested Recticare, sitz baths - is draining on its own and I suspect will improve significantly over next 24 hours with sitz baths - offered referral to general surgery if it does not continue to improve  This visit occurred during the SARS-CoV-2 public health emergency.  Safety protocols were in place, including screening questions prior to the visit, additional usage of staff PPE, and extensive cleaning of exam room while observing appropriate contact time as indicated for disinfecting solutions.      Clarene Reamer, FNP-BC  [facility], [facility]  12/16/2019 2:53 PM

## 2019-12-16 NOTE — Patient Instructions (Signed)
For pain and swelling- Recticare Advanced, twice a day for up to 7 days  OTC sitzbaths at drug store   Hemorrhoids Hemorrhoids are swollen veins in and around the rectum or anus. There are two types of hemorrhoids:  Internal hemorrhoids. These occur in the veins that are just inside the rectum. They may poke through to the outside and become irritated and painful.  External hemorrhoids. These occur in the veins that are outside the anus and can be felt as a painful swelling or hard lump near the anus. Most hemorrhoids do not cause serious problems, and they can be managed with home treatments such as diet and lifestyle changes. If home treatments do not help the symptoms, procedures can be done to shrink or remove the hemorrhoids. What are the causes? This condition is caused by increased pressure in the anal area. This pressure may result from various things, including:  Constipation.  Straining to have a bowel movement.  Diarrhea.  Pregnancy.  Obesity.  Sitting for long periods of time.  Heavy lifting or other activity that causes you to strain.  Anal sex.  Riding a bike for a long period of time. What are the signs or symptoms? Symptoms of this condition include:  Pain.  Anal itching or irritation.  Rectal bleeding.  Leakage of stool (feces).  Anal swelling.  One or more lumps around the anus. How is this diagnosed? This condition can often be diagnosed through a visual exam. Other exams or tests may also be done, such as:  An exam that involves feeling the rectal area with a gloved hand (digital rectal exam).  An exam of the anal canal that is done using a small tube (anoscope).  A blood test, if you have lost a significant amount of blood.  A test to look inside the colon using a flexible tube with a camera on the end (sigmoidoscopy or colonoscopy). How is this treated? This condition can usually be treated at home. However, various procedures may be done  if dietary changes, lifestyle changes, and other home treatments do not help your symptoms. These procedures can help make the hemorrhoids smaller or remove them completely. Some of these procedures involve surgery, and others do not. Common procedures include:  Rubber band ligation. Rubber bands are placed at the base of the hemorrhoids to cut off their blood supply.  Sclerotherapy. Medicine is injected into the hemorrhoids to shrink them.  Infrared coagulation. A type of light energy is used to get rid of the hemorrhoids.  Hemorrhoidectomy surgery. The hemorrhoids are surgically removed, and the veins that supply them are tied off.  Stapled hemorrhoidopexy surgery. The surgeon staples the base of the hemorrhoid to the rectal wall. Follow these instructions at home: Eating and drinking   Eat foods that have a lot of fiber in them, such as whole grains, beans, nuts, fruits, and vegetables.  Ask your health care provider about taking products that have added fiber (fiber supplements).  Reduce the amount of fat in your diet. You can do this by eating low-fat dairy products, eating less red meat, and avoiding processed foods.  Drink enough fluid to keep your urine pale yellow. Managing pain and swelling   Take warm sitz baths for 20 minutes, 3-4 times a day to ease pain and discomfort. You may do this in a bathtub or using a portable sitz bath that fits over the toilet.  If directed, apply ice to the affected area. Using ice packs between sitz baths  may be helpful. ? Put ice in a plastic bag. ? Place a towel between your skin and the bag. ? Leave the ice on for 20 minutes, 2-3 times a day. General instructions  Take over-the-counter and prescription medicines only as told by your health care provider.  Use medicated creams or suppositories as told.  Get regular exercise. Ask your health care provider how much and what kind of exercise is best for you. In general, you should do  moderate exercise for at least 30 minutes on most days of the week (150 minutes each week). This can include activities such as walking, biking, or yoga.  Go to the bathroom when you have the urge to have a bowel movement. Do not wait.  Avoid straining to have bowel movements.  Keep the anal area dry and clean. Use wet toilet paper or moist towelettes after a bowel movement.  Do not sit on the toilet for long periods of time. This increases blood pooling and pain.  Keep all follow-up visits as told by your health care provider. This is important. Contact a health care provider if you have:  Increasing pain and swelling that are not controlled by treatment or medicine.  Difficulty having a bowel movement, or you are unable to have a bowel movement.  Pain or inflammation outside the area of the hemorrhoids. Get help right away if you have:  Uncontrolled bleeding from your rectum. Summary  Hemorrhoids are swollen veins in and around the rectum or anus.  Most hemorrhoids can be managed with home treatments such as diet and lifestyle changes.  Taking warm sitz baths can help ease pain and discomfort.  In severe cases, procedures or surgery can be done to shrink or remove the hemorrhoids. This information is not intended to replace advice given to you by your health care provider. Make sure you discuss any questions you have with your health care provider. Document Revised: 12/14/2018 Document Reviewed: 12/07/2017 [employer] Patient Education  [place].

## 2019-12-21 ENCOUNTER — Encounter: Payer: Self-pay | Admitting: Family Medicine

## 2020-01-07 ENCOUNTER — Other Ambulatory Visit: Payer: Self-pay

## 2020-01-07 ENCOUNTER — Ambulatory Visit
Admission: RE | Admit: 2020-01-07 | Discharge: 2020-01-07 | Disposition: A | Discharge status: Home / Self Care | Payer: Managed Care, Other (non HMO) | Source: Ambulatory Visit | Attending: Pain Medicine | Admitting: Pain Medicine

## 2020-01-07 ENCOUNTER — Ambulatory Visit (HOSPITAL_BASED_OUTPATIENT_CLINIC_OR_DEPARTMENT_OTHER): Payer: Managed Care, Other (non HMO) | Admitting: Pain Medicine

## 2020-01-07 ENCOUNTER — Encounter: Payer: Self-pay | Admitting: Pain Medicine

## 2020-01-07 VITALS — BP 119/82 | HR 95 | Temp 97.1°F | Resp 18 | Ht 64.0 in | Wt 176.0 lb

## 2020-01-07 DIAGNOSIS — G8929 Other chronic pain: Secondary | ICD-10-CM | POA: Diagnosis present

## 2020-01-07 DIAGNOSIS — M47816 Spondylosis without myelopathy or radiculopathy, lumbar region: Secondary | ICD-10-CM | POA: Insufficient documentation

## 2020-01-07 DIAGNOSIS — M47817 Spondylosis without myelopathy or radiculopathy, lumbosacral region: Secondary | ICD-10-CM | POA: Diagnosis not present

## 2020-01-07 DIAGNOSIS — G8918 Other acute postprocedural pain: Secondary | ICD-10-CM | POA: Insufficient documentation

## 2020-01-07 DIAGNOSIS — M4310 Spondylolisthesis, site unspecified: Secondary | ICD-10-CM | POA: Insufficient documentation

## 2020-01-07 DIAGNOSIS — M545 Low back pain: Secondary | ICD-10-CM | POA: Diagnosis present

## 2020-01-07 DIAGNOSIS — M4300 Spondylolysis, site unspecified: Secondary | ICD-10-CM

## 2020-01-07 DIAGNOSIS — M4317 Spondylolisthesis, lumbosacral region: Secondary | ICD-10-CM

## 2020-01-07 DIAGNOSIS — M5137 Other intervertebral disc degeneration, lumbosacral region: Secondary | ICD-10-CM | POA: Diagnosis present

## 2020-01-07 MED ORDER — TRIAMCINOLONE ACETONIDE 40 MG/ML IJ SUSP
40.0000 mg | Freq: Once | INTRAMUSCULAR | Status: AC
  Administered 2020-01-07: 40 mg
  Filled 2020-01-07: qty 1

## 2020-01-07 MED ORDER — MIDAZOLAM HCL 5 MG/5ML IJ SOLN
1.0000 mg | INTRAMUSCULAR | Status: DC | PRN
  Administered 2020-01-07: 1 mg via INTRAVENOUS
  Filled 2020-01-07: qty 5

## 2020-01-07 MED ORDER — OXYCODONE-ACETAMINOPHEN 5-325 MG PO TABS: 1.0000 | ORAL_TABLET | Freq: Three times a day (TID) | ORAL | 0 refills | Status: DC | PRN

## 2020-01-07 MED ORDER — LIDOCAINE HCL 2 % IJ SOLN
20.0000 mL | Freq: Once | INTRAMUSCULAR | Status: AC
  Administered 2020-01-07: 400 mg
  Filled 2020-01-07: qty 40

## 2020-01-07 MED ORDER — ROPIVACAINE HCL 2 MG/ML IJ SOLN
9.0000 mL | Freq: Once | INTRAMUSCULAR | Status: AC
  Administered 2020-01-07: 9 mL via PERINEURAL
  Filled 2020-01-07: qty 10

## 2020-01-07 MED ORDER — LACTATED RINGERS IV SOLN
1000.0000 mL | Freq: Once | INTRAVENOUS | Status: AC
  Administered 2020-01-07: 1000 mL via INTRAVENOUS

## 2020-01-07 MED ORDER — FENTANYL CITRATE (PF) 100 MCG/2ML IJ SOLN
25.0000 ug | INTRAMUSCULAR | Status: DC | PRN
  Administered 2020-01-07: 50 ug via INTRAVENOUS
  Filled 2020-01-07: qty 2

## 2020-01-07 NOTE — Progress Notes (Signed)
PROVIDER NOTE: Information contained herein reflects review and annotations entered in association with encounter. Interpretation of such information and data should be left to medically-trained personnel. Information provided to patient can be located elsewhere in the medical record under "Patient Instructions". Document created using STT-dictation technology, any transcriptional errors that may result from process are unintentional.    Patient: Denise Burns  Service Category: Procedure  Provider: [name], MD  DOB: 30-Jan-1971  DOS: 01/07/2020  Location: [facility]  MRN: [id number]  Setting: Ambulatory - outpatient  Referring Provider: Milinda Pointer, MD  Type: Established Patient  Specialty: Interventional Pain Management  PCP: Elby Beck, FNP   Primary Reason for Visit: Interventional Pain Management Treatment. CC: Back Pain (low)  Procedure:          Anesthesia, Analgesia, Anxiolysis:  Type: Thermal Lumbar Facet, Medial Branch Radiofrequency Ablation/Neurotomy  #1  Primary Purpose: Therapeutic Region: Posterolateral Lumbosacral Spine Level: L2, L3, L4, L5, & S1 Medial Branch Level(s). These levels will denervate the L3-4, L4-5, and the L5-S1 lumbar facet joints. Laterality: Right  Type: Moderate (Conscious) Sedation combined with Local Anesthesia Indication(s): Analgesia and Anxiety Route: Intravenous (IV) IV Access: Secured Sedation: Meaningful verbal contact was maintained at all times during the procedure  Local Anesthetic: Lidocaine 1-2%  Position: Prone   Indications: 1. Lumbar facet syndrome (Bilateral)   2. Spondylosis without myelopathy or radiculopathy, lumbosacral region   3. Lumbar Grade 1 Retrolisthesis (2 mm) of L2/L3   4. DDD (degenerative disc disease), lumbosacral   5. L5-S1 pars defect w/ spondylolisthesis (Bilateral)   6. Spondylolisthesis (8 mm) at L5-S1 level   7. Chronic low back pain (Primary Area of Pain) (Bilateral)  (R>L) w/o sciatica    Denise Burns has been dealing with the above chronic pain for longer than three months and has either failed to respond, was unable to tolerate, or simply did not get enough benefit from other more conservative therapies including, but not limited to: 1. Over-the-counter medications 2. Anti-inflammatory medications 3. Muscle relaxants 4. Membrane stabilizers 5. Opioids 6. Physical therapy and/or chiropractic manipulation 7. Modalities (Heat, ice, etc.) 8. Invasive techniques such as nerve blocks. Denise Burns has attained more than 50% relief of the pain from a series of diagnostic injections conducted in separate occasions.  Pain Score: Pre-procedure: 4 /10 Post-procedure: 0-No pain/10  Pre-op Assessment:  Denise Burns is a 49 y.o. (year old), female patient, seen today for interventional treatment. She  has a past surgical history that includes Cholecystectomy and Polypectomy. Denise Burns has a current medication list which includes the following prescription(s): baclofen, cetirizine, fludrocortisone, hydrocodone-acetaminophen, [START ON 01/12/2020] hydrocodone-acetaminophen, hydrocortisone, meloxicam, pseudoephedrine, hydrocodone-acetaminophen, oxycodone-acetaminophen, and [START ON 01/14/2020] oxycodone-acetaminophen, and the following Facility-Administered Medications: fentanyl and midazolam. Her primarily concern today is the Back Pain (low)  Initial Vital Signs:  Pulse/HCG Rate: 95ECG Heart Rate: 86 Temp: (!) 97.4 F (36.3 C) Resp: 16 BP: 127/85 SpO2: 100 %  BMI: Estimated body mass index is 30.21 kg/m as calculated from the following:   Height as of this encounter: 5\' 4"  (1.626 m).   Weight as of this encounter: 176 lb (79.8 kg).  Risk Assessment: Allergies: Reviewed. She is allergic to lisinopril; ultram [tramadol]; and sulfa antibiotics.  Allergy Precautions: None required Coagulopathies: Reviewed. None identified.  Blood-thinner therapy: None at  this time Active Infection(s): Reviewed. None identified. Denise Burns is afebrile  Site Confirmation: Denise Burns was asked to confirm the procedure and laterality before marking the site Procedure checklist:  Completed Consent: Before the procedure and under the influence of no sedative(s), amnesic(s), or anxiolytics, the patient was informed of the treatment options, risks and possible complications. To fulfill our ethical and legal obligations, as recommended by the American Medical Association's Code of Ethics, I have informed the patient of my clinical impression; the nature and purpose of the treatment or procedure; the risks, benefits, and possible complications of the intervention; the alternatives, including doing nothing; the risk(s) and benefit(s) of the alternative treatment(s) or procedure(s); and the risk(s) and benefit(s) of doing nothing. The patient was provided information about the general risks and possible complications associated with the procedure. These may include, but are not limited to: failure to achieve desired goals, infection, bleeding, organ or nerve damage, allergic reactions, paralysis, and death. In addition, the patient was informed of those risks and complications associated to Spine-related procedures, such as failure to decrease pain; infection (i.e.: Meningitis, epidural or intraspinal abscess); bleeding (i.e.: epidural hematoma, subarachnoid hemorrhage, or any other type of intraspinal or peri-dural bleeding); organ or nerve damage (i.e.: Any type of peripheral nerve, nerve root, or spinal cord injury) with subsequent damage to sensory, motor, and/or autonomic systems, resulting in permanent pain, numbness, and/or weakness of one or several areas of the body; allergic reactions; (i.e.: anaphylactic reaction); and/or death. Furthermore, the patient was informed of those risks and complications associated with the medications. These include, but are not limited to:  allergic reactions (i.e.: anaphylactic or anaphylactoid reaction(s)); adrenal axis suppression; blood sugar elevation that in diabetics may result in ketoacidosis or comma; water retention that in patients with history of congestive heart failure may result in shortness of breath, pulmonary edema, and decompensation with resultant heart failure; weight gain; swelling or edema; medication-induced neural toxicity; particulate matter embolism and blood vessel occlusion with resultant organ, and/or nervous system infarction; and/or aseptic necrosis of one or more joints. Finally, the patient was informed that Medicine is not an exact science; therefore, there is also the possibility of unforeseen or unpredictable risks and/or possible complications that may result in a catastrophic outcome. The patient indicated having understood very clearly. We have given the patient no guarantees and we have made no promises. Enough time was given to the patient to ask questions, all of which were answered to the patient's satisfaction. Denise Burns has indicated that she wanted to continue with the procedure. Attestation: I, the ordering provider, attest that I have discussed with the patient the benefits, risks, side-effects, alternatives, likelihood of achieving goals, and potential problems during recovery for the procedure that I have provided informed consent. Date  Time: 01/07/2020 10:15 AM  Pre-Procedure Preparation:  Monitoring: As per clinic protocol. Respiration, ETCO2, SpO2, BP, heart rate and rhythm monitor placed and checked for adequate function Safety Precautions: Patient was assessed for positional comfort and pressure points before starting the procedure. Time-out: I initiated and conducted the "Time-out" before starting the procedure, as per protocol. The patient was asked to participate by confirming the accuracy of the "Time Out" information. Verification of the correct person, site, and procedure were  performed and confirmed by me, the nursing staff, and the patient. "Time-out" conducted as per Joint Commission's Universal Protocol (UP.01.01.01). Time: 1118  Description of Procedure:          Laterality: Right Levels:  L2, L3, L4, L5, & S1 Medial Branch Level(s), at the L3-4, L4-5, and the L5-S1 lumbar facet joints. Area Prepped: Lumbosacral DuraPrep (Iodine Povacrylex [0.7% available iodine] and Isopropyl Alcohol, 74% w/w) Safety  Precautions: Aspiration looking for blood return was conducted prior to all injections. At no point did we inject any substances, as a needle was being advanced. Before injecting, the patient was told to immediately notify me if she was experiencing any new onset of "ringing in the ears, or metallic taste in the mouth". No attempts were made at seeking any paresthesias. Safe injection practices and needle disposal techniques used. Medications properly checked for expiration dates. SDV (single dose vial) medications used. After the completion of the procedure, all disposable equipment used was discarded in the proper designated medical waste containers. Local Anesthesia: Protocol guidelines were followed. The patient was positioned over the fluoroscopy table. The area was prepped in the usual manner. The time-out was completed. The target area was identified using fluoroscopy. A 12-in long, straight, sterile hemostat was used with fluoroscopic guidance to locate the targets for each level blocked. Once located, the skin was marked with an approved surgical skin marker. Once all sites were marked, the skin (epidermis, dermis, and hypodermis), as well as deeper tissues (fat, connective tissue and muscle) were infiltrated with a small amount of a short-acting local anesthetic, loaded on a 10cc syringe with a 25G, 1.5-in  Needle. An appropriate amount of time was allowed for local anesthetics to take effect before proceeding to the next step. Local Anesthetic: Lidocaine 2.0% The  unused portion of the local anesthetic was discarded in the proper designated containers. Technical explanation of process:  Radiofrequency Ablation (RFA) L2 Medial Branch Nerve RFA: The target area for the L2 medial branch is at the junction of the postero-lateral aspect of the superior articular process and the superior, posterior, and medial edge of the transverse process of L3. Under fluoroscopic guidance, a Radiofrequency needle was inserted until contact was made with os over the superior postero-lateral aspect of the pedicular shadow (target area). Sensory and motor testing was conducted to properly adjust the position of the needle. Once satisfactory placement of the needle was achieved, the numbing solution was slowly injected after negative aspiration for blood. 2.0 mL of the nerve block solution was injected without difficulty or complication. After waiting for at least 3 minutes, the ablation was performed. Once completed, the needle was removed intact. L3 Medial Branch Nerve RFA: The target area for the L3 medial branch is at the junction of the postero-lateral aspect of the superior articular process and the superior, posterior, and medial edge of the transverse process of L4. Under fluoroscopic guidance, a Radiofrequency needle was inserted until contact was made with os over the superior postero-lateral aspect of the pedicular shadow (target area). Sensory and motor testing was conducted to properly adjust the position of the needle. Once satisfactory placement of the needle was achieved, the numbing solution was slowly injected after negative aspiration for blood. 2.0 mL of the nerve block solution was injected without difficulty or complication. After waiting for at least 3 minutes, the ablation was performed. Once completed, the needle was removed intact. L4 Medial Branch Nerve RFA: The target area for the L4 medial branch is at the junction of the postero-lateral aspect of the superior  articular process and the superior, posterior, and medial edge of the transverse process of L5. Under fluoroscopic guidance, a Radiofrequency needle was inserted until contact was made with os over the superior postero-lateral aspect of the pedicular shadow (target area). Sensory and motor testing was conducted to properly adjust the position of the needle. Once satisfactory placement of the needle was achieved, the numbing solution  was slowly injected after negative aspiration for blood. 2.0 mL of the nerve block solution was injected without difficulty or complication. After waiting for at least 3 minutes, the ablation was performed. Once completed, the needle was removed intact. L5 Medial Branch Nerve RFA: The target area for the L5 medial branch is at the junction of the postero-lateral aspect of the superior articular process of S1 and the superior, posterior, and medial edge of the sacral ala. Under fluoroscopic guidance, a Radiofrequency needle was inserted until contact was made with os over the superior postero-lateral aspect of the pedicular shadow (target area). Sensory and motor testing was conducted to properly adjust the position of the needle. Once satisfactory placement of the needle was achieved, the numbing solution was slowly injected after negative aspiration for blood. 2.0 mL of the nerve block solution was injected without difficulty or complication. After waiting for at least 3 minutes, the ablation was performed. Once completed, the needle was removed intact. S1 Medial Branch Nerve RFA: The target area for the S1 medial branch is located inferior to the junction of the S1 superior articular process and the L5 inferior articular process, posterior, inferior, and lateral to the 6 o'clock position of the L5-S1 facet joint, just superior to the S1 posterior foramen. Under fluoroscopic guidance, the Radiofrequency needle was advanced until contact was made with os over the Target area. Sensory  and motor testing was conducted to properly adjust the position of the needle. Once satisfactory placement of the needle was achieved, the numbing solution was slowly injected after negative aspiration for blood. 2.0 mL of the nerve block solution was injected without difficulty or complication. After waiting for at least 3 minutes, the ablation was performed. Once completed, the needle was removed intact. Radiofrequency lesioning (ablation):  Radiofrequency Generator: NeuroTherm [id number] Sensory Stimulation Parameters: 50 Hz was used to locate & identify the nerve, making sure that the needle was positioned such that there was no sensory stimulation below 0.3 V or above 0.7 V. Motor Stimulation Parameters: 2 Hz was used to evaluate the motor component. Care was taken not to lesion any nerves that demonstrated motor stimulation of the lower extremities at an output of less than 2.5 times that of the sensory threshold, or a maximum of 2.0 V. Lesioning Technique Parameters: Standard Radiofrequency settings. (Not bipolar or pulsed.) Temperature Settings: 80 degrees C Lesioning time: 60 seconds Intra-operative Compliance: Compliant Materials & Medications: Needle(s) (Electrode/Cannula) Type: Teflon-coated, curved tip, Radiofrequency needle(s) Gauge: 22G Length: 10cm Numbing solution: 0.2% PF-Ropivacaine + Triamcinolone (40 mg/mL) diluted to a final concentration of 4 mg of Triamcinolone/mL of Ropivacaine The unused portion of the solution was discarded in the proper designated containers.  Once the entire procedure was completed, the treated area was cleaned, making sure to leave some of the prepping solution back to take advantage of its long term bactericidal properties.  Illustration of the posterior view of the lumbar spine and the posterior neural structures. Laminae of L2 through S1 are labeled. DPRL5, dorsal primary ramus of L5; DPRS1, dorsal primary ramus of S1; DPR3, dorsal primary ramus of L3;  FJ, facet (zygapophyseal) joint L3-L4; I, inferior articular process of L4; LB1, lateral branch of dorsal primary ramus of L1; IAB, inferior articular branches from L3 medial branch (supplies L4-L5 facet joint); IBP, intermediate branch plexus; MB3, medial branch of dorsal primary ramus of L3; NR3, third lumbar nerve root; S, superior articular process of L5; SAB, superior articular branches from L4 (supplies L4-5 facet joint also);  TP3, transverse process of L3.  Vitals:   01/07/20 1158 01/07/20 1203 01/07/20 1212 01/07/20 1222  BP: (!) 128/91 125/90 122/83 119/82  Pulse:      Resp: 14 15 16 18   Temp:   (!) 97.1 F (36.2 C)   SpO2: 95% 96% 97% 100%  Weight:      Height:       Start Time: 1118 hrs. End Time: 1202 hrs.  Imaging Guidance (Spinal):          Type of Imaging Technique: Fluoroscopy Guidance (Spinal) Indication(s): Assistance in needle guidance and placement for procedures requiring needle placement in or near specific anatomical locations not easily accessible without such assistance. Exposure Time: Please see nurses notes. Contrast: None used. Fluoroscopic Guidance: I was personally present during the use of fluoroscopy. "Tunnel Vision Technique" used to obtain the best possible view of the target area. Parallax error corrected before commencing the procedure. "Direction-depth-direction" technique used to introduce the needle under continuous pulsed fluoroscopy. Once target was reached, antero-posterior, oblique, and lateral fluoroscopic projection used confirm needle placement in all planes. Images permanently stored in EMR. Interpretation: No contrast injected. I personally interpreted the imaging intraoperatively. Adequate needle placement confirmed in multiple planes. Permanent images saved into the patient's record.  Antibiotic Prophylaxis:   Anti-infectives (From admission, onward)   None     Indication(s): None identified  Post-operative Assessment:  Post-procedure  Vital Signs:  Pulse/HCG Rate: 9596 Temp: (!) 97.1 F (36.2 C) Resp: 18 BP: 119/82 SpO2: 100 %  EBL: None  Complications: No immediate post-treatment complications observed by team, or reported by patient.  Note: The patient tolerated the entire procedure well. A repeat set of vitals were taken after the procedure and the patient was kept under observation following institutional policy, for this type of procedure. Post-procedural neurological assessment was performed, showing return to baseline, prior to discharge. The patient was provided with post-procedure discharge instructions, including a section on how to identify potential problems. Should any problems arise concerning this procedure, the patient was given instructions to immediately contact us, at any time, without hesitation. In any case, we plan to contact the patient by telephone for a follow-up status report regarding this interventional procedure.  Comments:  No additional relevant information.  Plan of Care  Orders:  Orders Placed This Encounter  Procedures  . Radiofrequency,Lumbar    Scheduling Instructions:     Side(s): Right-sided     Level: L3-4, L4-5, & L5-S1 Facets (L2, L3, L4, L5, & S1 Medial Branch Nerves)     Sedation: With Sedation.     Timeframe: Today    Order Specific Question:   Where will this procedure be performed?    Answer:   [facility]  . Radiofrequency,Lumbar    Standing Status:   Future    Standing Expiration Date:   01/06/2021    Scheduling Instructions:     Side(s): Left-sided     Level: L3-4, L4-5, & L5-S1 Facets (L2, L3, L4, L5, & S1 Medial Branch Nerves)     Sedation: With Sedation.     Scheduling Timeframe: 2 weeks from now    Order Specific Question:   Where will this procedure be performed?    Answer:   [facility]  . DG PA[facility] C-ARM 1-60 MIN NO REPORT    Intraoperative interpretation by procedural physician at [place].    Standing Status:    Standing    Number of Occurrences:   1  Order Specific Question:   Reason for exam:    Answer:   Assistance in needle guidance and placement for procedures requiring needle placement in or near specific anatomical locations not easily accessible without such assistance.  . Informed Consent Details: Physician/Practitioner Attestation; Transcribe to consent form and obtain patient signature    Nursing Order: Transcribe to consent form and obtain patient signature. Note: Always confirm laterality of pain with Denise Burns, before procedure. Procedure: Lumbar Facet Radiofrequency Ablation Indication/Reason: Low Back Pain, with our without leg pain, due to Facet Joint Arthralgia (Joint Pain) known as Lumbar Facet Syndrome, secondary to Lumbar, and/or Lumbosacral Spondylosis (Arthritis of the Spine), without myelopathy or radiculopathy (Nerve Damage). Provider Attestation: I, [place] Denise Arbour, MD, (Pain Management Specialist), the physician/practitioner, attest that I have discussed with the patient the benefits, risks, side effects, alternatives, likelihood of achieving goals and potential problems during recovery for the procedure that I have provided informed consent.  . Provide equipment / supplies at bedside    Equipment required: Sterile "Radiofrequency Tray"; Large hemostat (1); Small hemostat (1); Towels (6-8); 4x4 sterile sponge pack (1) Radiofrequency Needle(s): Size: Regular Quantity: 5    Standing Status:   Standing    Number of Occurrences:   1    Order Specific Question:   Specify    Answer:   Radiofrequency Tray   Chronic Opioid Analgesic:  Hydrocodone/APAP 7.5/325 1 tablet p.o. 3 times daily (22.5 mg/day of hydrocodone)  MME/day:22.5mg /day.   Medications ordered for procedure: Meds ordered this encounter  Medications  . lidocaine (XYLOCAINE) 2 % (with pres) injection 400 mg  . lactated ringers infusion 1,000 mL  . midazolam (VERSED) 5 MG/5ML injection 1-2 mg    Make  sure Flumazenil is available in the pyxis when using this medication. If oversedation occurs, administer 0.2 mg IV over 15 sec. If after 45 sec no response, administer 0.2 mg again over 1 min; may repeat at 1 min intervals; not to exceed 4 doses (1 mg)  . fentaNYL (SUBLIMAZE) injection 25-50 mcg    Make sure Narcan is available in the pyxis when using this medication. In the event of respiratory depression (RR< 8/min): Titrate NARCAN (naloxone) in increments of 0.1 to 0.2 mg IV at 2-3 minute intervals, until desired degree of reversal.  . ropivacaine (PF) 2 mg/mL (0.2%) (NAROPIN) injection 9 mL  . triamcinolone acetonide (KENALOG-40) injection 40 mg  . oxyCODONE-acetaminophen (PERCOCET) 5-325 MG tablet    Sig: Take 1 tablet by mouth every 8 (eight) hours as needed for up to 7 days for severe pain. Must last 7 days.    Dispense:  21 tablet    Refill:  0    For acute post-operative pain. Not to be refilled.  Must last 7 days.  Marland Kitchen oxyCODONE-acetaminophen (PERCOCET) 5-325 MG tablet    Sig: Take 1 tablet by mouth every 8 (eight) hours as needed for up to 7 days for severe pain. Must last 7 days.    Dispense:  21 tablet    Refill:  0    For acute post-operative pain. Not to be refilled.  Must last 7 days.   Medications administered: We administered lidocaine, lactated ringers, midazolam, fentaNYL, ropivacaine (PF) 2 mg/mL (0.2%), and triamcinolone acetonide.  See the medical record for exact dosing, route, and time of administration.  Follow-up plan:   Return in about 2 weeks (around 01/21/2020) for RFA (w/ sedation): (L) L-FCT RFA #1.       Considering: Therapeutic bilateral lumbar facetRFA #  1(starting with the right side) [place] joint Blk PossiblebilateralSI joint RFA Diagnostic bilateral L5 TFESI Diagnostic left-sided L2 TFESI   PRN Procedures: Palliative bilateral lumbar facet block #3    Recent Visits Date Type Provider Dept  12/12/19 Telemedicine  Milinda Pointer, MD [facility]  11/07/19 Telemedicine Milinda Pointer, MD [facility]  Showing recent visits within past 90 days and meeting all other requirements   Today's Visits Date Type Provider Dept  01/07/20 Procedure visit Milinda Pointer, MD [facility]  Showing today's visits and meeting all other requirements   Future Appointments Date Type Provider Dept  02/11/20 Appointment Milinda Pointer, MD [facility]  Showing future appointments within next 90 days and meeting all other requirements   Disposition: Discharge home  Discharge (Date  Time): 01/07/2020; 1231 hrs.   Primary Care Physician: Elby Beck, FNP Location: [facility] Note by: [name], MD Date: 01/07/2020; Time: 1:48 PM  Disclaimer:  Medicine is not an Chief Strategy Officer. The only guarantee in medicine is that nothing is guaranteed. It is important to note that the decision to proceed with this intervention was based on the information collected from the patient. The Data and conclusions were drawn from the patient's questionnaire, the interview, and the physical examination. Because the information was provided in large part by the patient, it cannot be guaranteed that it has not been purposely or unconsciously manipulated. Every effort has been made to obtain as much relevant data as possible for this evaluation. It is important to note that the conclusions that lead to this procedure are derived in large part from the available data. Always take into account that the treatment will also be dependent on availability of resources and existing treatment guidelines, considered by other Pain Management Practitioners as being common knowledge and practice, at the time of the intervention. For Medico-Legal purposes, it is also important to point out that variation in procedural techniques and pharmacological choices are the acceptable  norm. The indications, contraindications, technique, and results of the above procedure should only be interpreted and judged by a Board-Certified Interventional Pain Specialist with extensive familiarity and expertise in the same exact procedure and technique.

## 2020-01-07 NOTE — Patient Instructions (Addendum)
___________________________________________________________________________________________  Post-Radiofrequency (RF) Discharge Instructions  You have just completed a Radiofrequency Neurotomy.  The following instructions will provide you with information and guidelines for self-care upon discharge.  If at any time you have questions or concerns please call your physician. DO NOT DRIVE YOURSELF!!  Instructions:  Apply ice: Fill a plastic sandwich bag with crushed ice. Cover it with a small towel and apply to injection site. Apply for 15 minutes then remove x 15 minutes. Repeat sequence on day of procedure, until you go to bed. The purpose is to minimize swelling and discomfort after procedure.  Apply heat: Apply heat to procedure site starting the day following the procedure. The purpose is to treat any soreness and discomfort from the procedure.  Food intake: No eating limitations, unless stipulated above.  Nevertheless, if you have had sedation, you may experience some nausea.  In this case, it may be wise to wait at least two hours prior to resuming regular diet.  Physical activities: Keep activities to a minimum for the first 8 hours after the procedure. For the first 24 hours after the procedure, do not drive a motor vehicle,  Operate heavy machinery, power tools, or handle any weapons.  Consider walking with the use of an assistive device or accompanied by an adult for the first 24 hours.  Do not drink alcoholic beverages including beer.  Do not make any important decisions or sign any legal documents. Go home and rest today.  Resume activities tomorrow, as tolerated.  Use caution in moving about as you may experience mild leg weakness.  Use caution in cooking, use of household electrical appliances and climbing steps.  Driving: If you have received any sedation, you are not allowed to drive for 24 hours after your procedure.  Blood thinner: Restart your blood thinner 6 hours after your  procedure. (Only for those taking blood thinners)  Insulin: As soon as you can eat, you may resume your normal dosing schedule. (Only for those taking insulin)  Medications: May resume pre-procedure medications.  Do not take any drugs, other than what has been prescribed to you.  Infection prevention: Keep procedure site clean and dry.  Post-procedure Pain Diary: Extremely important that this be done correctly and accurately. Recorded information will be used to determine the next step in treatment.  Pain evaluated is that of treated area only. Do not include pain from an untreated area.  Complete every hour, on the hour, for the initial 8 hours. Set an alarm to help you do this part accurately.  Do not go to sleep and have it completed later. It will not be accurate.  Follow-up appointment: Keep your follow-up appointment after the procedure. Usually 2-6 weeks after radiofrequency. Bring you pain diary. The information collected will be essential for your long-term care.   Expect:  From numbing medicine (AKA: Local Anesthetics): Numbness or decrease in pain.  Onset: Full effect within 15 minutes of injected.  Duration: It will depend on the type of local anesthetic used. On the average, 1 to 8 hours.   From steroids (when added): Decrease in swelling or inflammation. Once inflammation is improved, relief of the pain will follow.  Onset of benefits: Depends on the amount of swelling present. The more swelling, the longer it will take for the benefits to be seen. In some cases, up to 10 days.  Duration: Steroids will stay in the system x 2 weeks. Duration of benefits will depend on multiple posibilities including persistent irritating factors.    From procedure: Some discomfort is to be expected once the numbing medicine wears off. In the case of radiofrequency procedures, this may last as long as 6 weeks. Additional post-procedure pain medication is provided for this. Discomfort is  minimized if ice and heat are applied as instructed.  Call if:  You experience numbness and weakness that gets worse with time, as opposed to wearing off.  He experience any unusual bleeding, difficulty breathing, or loss of the ability to control your bowel and bladder. (This applies to Spinal procedures only)  You experience any redness, swelling, heat, red streaks, elevated temperature, fever, or any other signs of a possible infection.  Emergency Numbers:  Durning business hours (Monday - Thursday, 8:00 AM - 4:00 PM) (Friday, 9:00 AM - 12:00 Noon): [phone]  After hours: [phone] ____________________________________________________________________________________________   ____________________________________________________________________________________________  Preparing for Procedure with Sedation  Procedure appointments are limited to planned procedures: . No Prescription Refills. . No disability issues will be discussed. . No medication changes will be discussed.  Instructions: . Oral Intake: Do not eat or drink anything for at least 8 hours prior to your procedure. (Exception: Blood Pressure Medication. See below.) . Transportation: Unless otherwise stated by your physician, you may drive yourself after the procedure. . Blood Pressure Medicine: Do not forget to take your blood pressure medicine with a sip of water the morning of the procedure. If your Diastolic (lower reading)is above 100 mmHg, elective cases will be cancelled/rescheduled. . Blood thinners: These will need to be stopped for procedures. Notify our staff if you are taking any blood thinners. Depending on which one you take, there will be specific instructions on how and when to stop it. . Diabetics on insulin: Notify the staff so that you can be scheduled 1st case in the morning. If your diabetes requires high dose insulin, take only  of your normal insulin dose the morning of the procedure and  notify the staff that you have done so. . Preventing infections: Shower with an antibacterial soap the morning of your procedure. . Build-up your immune system: Take 1000 mg of Vitamin C with every meal (3 times a day) the day prior to your procedure. . Antibiotics: Inform the staff if you have a condition or reason that requires you to take antibiotics before dental procedures. . Pregnancy: If you are pregnant, call and cancel the procedure. . Sickness: If you have a cold, fever, or any active infections, call and cancel the procedure. . Arrival: You must be in the facility at least 30 minutes prior to your scheduled procedure. . Children: Do not bring children with you. . Dress appropriately: Bring dark clothing that you would not mind if they get stained. . Valuables: Do not bring any jewelry or valuables.  Reasons to call and reschedule or cancel your procedure: (Following these recommendations will minimize the risk of a serious complication.) . Surgeries: Avoid having procedures within 2 weeks of any surgery. (Avoid for 2 weeks before or after any surgery). . Flu Shots: Avoid having procedures within 2 weeks of a flu shots or . (Avoid for 2 weeks before or after immunizations). . Barium: Avoid having a procedure within 7-10 days after having had a radiological study involving the use of radiological contrast. (Myelograms, Barium swallow or enema study). . Heart attacks: Avoid any elective procedures or surgeries for the initial 6 months after a "Myocardial Infarction" (Heart Attack). . Blood thinners: It is imperative that you stop these medications before procedures. Let   us know if you if you take any blood thinner.  . Infection: Avoid procedures during or within two weeks of an infection (including chest colds or gastrointestinal problems). Symptoms associated with infections include: Localized redness, fever, chills, night sweats or profuse sweating, burning sensation when voiding, cough,  congestion, stuffiness, runny nose, sore throat, diarrhea, nausea, vomiting, cold or Flu symptoms, recent or current infections. It is specially important if the infection is over the area that we intend to treat. . Heart and lung problems: Symptoms that may suggest an active cardiopulmonary problem include: cough, chest pain, breathing difficulties or shortness of breath, dizziness, ankle swelling, uncontrolled high or unusually low blood pressure, and/or palpitations. If you are experiencing any of these symptoms, cancel your procedure and contact your primary care physician for an evaluation.  Remember:  Regular Business hours are:  Monday to Thursday 8:00 AM to 4:00 PM  Provider's Schedule: Naiara Lombardozzi, MD:  Procedure days: Tuesday and Thursday 7:30 AM to 4:00 PM  Bilal Lateef, MD:  Procedure days: Monday and Wednesday 7:30 AM to 4:00 PM ____________________________________________________________________________________________    

## 2020-01-07 NOTE — Progress Notes (Signed)
Safety precautions to be maintained throughout the outpatient stay will include: orient to surroundings, keep bed in low position, maintain call bell within reach at all times, provide assistance with transfer out of bed and ambulation.  

## 2020-01-08 ENCOUNTER — Telehealth: Payer: Self-pay

## 2020-01-08 NOTE — Telephone Encounter (Signed)
Post procedure phone call.  LM 

## 2020-01-21 ENCOUNTER — Telehealth: Payer: Self-pay | Admitting: Pain Medicine

## 2020-01-21 NOTE — Telephone Encounter (Signed)
Patient is still having severe pain from RFA 2wks ago. Is this normal and how long will this continue?

## 2020-01-21 NOTE — Telephone Encounter (Signed)
Spoke with patient and let her know that post procedure pain from RFA is normal and should improve.  Explained that we do not evaluate progress until 6 weeks out.  Patient verbalizes u/o information.

## 2020-02-04 ENCOUNTER — Other Ambulatory Visit: Payer: Self-pay

## 2020-02-04 ENCOUNTER — Ambulatory Visit
Admission: RE | Admit: 2020-02-04 | Discharge: 2020-02-04 | Disposition: A | Discharge status: Home / Self Care | Payer: Managed Care, Other (non HMO) | Source: Ambulatory Visit | Attending: Pain Medicine | Admitting: Pain Medicine

## 2020-02-04 ENCOUNTER — Ambulatory Visit (HOSPITAL_BASED_OUTPATIENT_CLINIC_OR_DEPARTMENT_OTHER): Payer: Managed Care, Other (non HMO) | Admitting: Pain Medicine

## 2020-02-04 VITALS — BP 120/81 | HR 86 | Temp 97.5°F | Resp 17 | Ht 64.0 in | Wt 172.0 lb

## 2020-02-04 DIAGNOSIS — M4317 Spondylolisthesis, lumbosacral region: Secondary | ICD-10-CM | POA: Insufficient documentation

## 2020-02-04 DIAGNOSIS — M47817 Spondylosis without myelopathy or radiculopathy, lumbosacral region: Secondary | ICD-10-CM | POA: Diagnosis present

## 2020-02-04 DIAGNOSIS — M47816 Spondylosis without myelopathy or radiculopathy, lumbar region: Secondary | ICD-10-CM

## 2020-02-04 DIAGNOSIS — G8929 Other chronic pain: Secondary | ICD-10-CM | POA: Insufficient documentation

## 2020-02-04 DIAGNOSIS — M545 Low back pain, unspecified: Secondary | ICD-10-CM

## 2020-02-04 DIAGNOSIS — M5137 Other intervertebral disc degeneration, lumbosacral region: Secondary | ICD-10-CM | POA: Insufficient documentation

## 2020-02-04 DIAGNOSIS — M4306 Spondylolysis, lumbar region: Secondary | ICD-10-CM

## 2020-02-04 DIAGNOSIS — G8918 Other acute postprocedural pain: Secondary | ICD-10-CM | POA: Insufficient documentation

## 2020-02-04 MED ORDER — OXYCODONE-ACETAMINOPHEN 5-325 MG PO TABS: 1.0000 | ORAL_TABLET | Freq: Three times a day (TID) | ORAL | 0 refills | Status: DC | PRN

## 2020-02-04 MED ORDER — ROPIVACAINE HCL 2 MG/ML IJ SOLN
9.0000 mL | Freq: Once | INTRAMUSCULAR | Status: AC
  Administered 2020-02-04: 9 mL via PERINEURAL
  Filled 2020-02-04: qty 10

## 2020-02-04 MED ORDER — MIDAZOLAM HCL 5 MG/5ML IJ SOLN
1.0000 mg | INTRAMUSCULAR | Status: DC | PRN
  Administered 2020-02-04: 1 mg via INTRAVENOUS
  Filled 2020-02-04: qty 5

## 2020-02-04 MED ORDER — FENTANYL CITRATE (PF) 100 MCG/2ML IJ SOLN
25.0000 ug | INTRAMUSCULAR | Status: DC | PRN
  Administered 2020-02-04: 50 ug via INTRAVENOUS
  Filled 2020-02-04: qty 2

## 2020-02-04 MED ORDER — LACTATED RINGERS IV SOLN
1000.0000 mL | Freq: Once | INTRAVENOUS | Status: AC
  Administered 2020-02-04: 1000 mL via INTRAVENOUS

## 2020-02-04 MED ORDER — TRIAMCINOLONE ACETONIDE 40 MG/ML IJ SUSP
40.0000 mg | Freq: Once | INTRAMUSCULAR | Status: AC
  Administered 2020-02-04: 40 mg
  Filled 2020-02-04: qty 1

## 2020-02-04 MED ORDER — LIDOCAINE HCL 2 % IJ SOLN
20.0000 mL | Freq: Once | INTRAMUSCULAR | Status: AC
  Administered 2020-02-04: 400 mg
  Filled 2020-02-04: qty 20

## 2020-02-04 NOTE — Patient Instructions (Signed)

## 2020-02-04 NOTE — Progress Notes (Signed)
Safety precautions to be maintained throughout the outpatient stay will include: orient to surroundings, keep bed in low position, maintain call bell within reach at all times, provide assistance with transfer out of bed and ambulation.  

## 2020-02-04 NOTE — Progress Notes (Signed)
PROVIDER NOTE: Information contained herein reflects review and annotations entered in association with encounter. Interpretation of such information and data should be left to medically-trained personnel. Information provided to patient can be located elsewhere in the medical record under "Patient Instructions". Document created using STT-dictation technology, any transcriptional errors that may result from process are unintentional.    Patient: Denise Burns  Service Category: Procedure  Provider: [name], MD  DOB: 11-Aug-1970  DOS: 02/04/2020  Location: [facility]  MRN: [id number]  Setting: Ambulatory - outpatient  Referring Provider: Milinda Pointer, MD  Type: Established Patient  Specialty: Interventional Pain Management  PCP: Elby Beck, FNP   Primary Reason for Visit: Interventional Pain Management Treatment. CC: Back Pain (left, lower)  Procedure:          Anesthesia, Analgesia, Anxiolysis:  Type: Thermal Lumbar Facet, Medial Branch Radiofrequency Ablation/Neurotomy  #1  Primary Purpose: Therapeutic Region: Posterolateral Lumbosacral Spine Level: L2, L3, L4, L5, & S1 Medial Branch Level(s). These levels will denervate the L3-4, L4-5, and the L5-S1 lumbar facet joints. Laterality: Left  Type: Moderate (Conscious) Sedation combined with Local Anesthesia Indication(s): Analgesia and Anxiety Route: Intravenous (IV) IV Access: Secured Sedation: Meaningful verbal contact was maintained at all times during the procedure  Local Anesthetic: Lidocaine 1-2%  Position: Prone   Indications: 1. Spondylosis without myelopathy or radiculopathy, lumbosacral region   2. Lumbar facet syndrome (Bilateral)   3. Lumbar facet hypertrophy (Multilevel) (Bilateral)   4. Lumbar pars defect (L5-S1) (Bilateral)   5. Osteoarthritis of facet joint of lumbar spine   6. Spondylolisthesis (8 mm) at L5-S1 level   7. DDD (degenerative disc disease), lumbosacral   8. Chronic  low back pain (Primary Area of Pain) (Bilateral) (R>L) w/o sciatica    Denise Burns has been dealing with the above chronic pain for longer than three months and has either failed to respond, was unable to tolerate, or simply did not get enough benefit from other more conservative therapies including, but not limited to: 1. Over-the-counter medications 2. Anti-inflammatory medications 3. Muscle relaxants 4. Membrane stabilizers 5. Opioids 6. Physical therapy and/or chiropractic manipulation 7. Modalities (Heat, ice, etc.) 8. Invasive techniques such as nerve blocks. Denise Burns has attained more than 50% relief of the pain from a series of diagnostic injections conducted in separate occasions.  Pain Score: Pre-procedure: 4 /10 Post-procedure: 0-No pain/10  Pre-op Assessment:  Denise Burns is a 49 y.o. (year old), female patient, seen today for interventional treatment. She  has a past surgical history that includes Cholecystectomy and Polypectomy. Denise Burns has a current medication list which includes the following prescription(s): baclofen, cetirizine, fludrocortisone, hydrocodone-acetaminophen, hydrocortisone, meloxicam, pseudoephedrine, hydrocodone-acetaminophen, hydrocodone-acetaminophen, oxycodone-acetaminophen, and [START ON 02/11/2020] oxycodone-acetaminophen, and the following Facility-Administered Medications: fentanyl and midazolam. Her primarily concern today is the Back Pain (left, lower)  Initial Vital Signs:  Pulse/HCG Rate: 86ECG Heart Rate: 73 Temp: 97.6 F (36.4 C) Resp: 16 BP: (!) 138/98 SpO2: 100 %  BMI: Estimated body mass index is 29.52 kg/m as calculated from the following:   Height as of this encounter: 5\' 4"  (1.626 m).   Weight as of this encounter: 172 lb (78 kg).  Risk Assessment: Allergies: Reviewed. She is allergic to lisinopril, ultram [tramadol], and sulfa antibiotics.  Allergy Precautions: None required Coagulopathies: Reviewed. None identified.   Blood-thinner therapy: None at this time Active Infection(s): Reviewed. None identified. Denise Burns is afebrile  Site Confirmation: Denise Burns was asked to confirm the procedure and laterality before  marking the site Procedure checklist: Completed Consent: Before the procedure and under the influence of no sedative(s), amnesic(s), or anxiolytics, the patient was informed of the treatment options, risks and possible complications. To fulfill our ethical and legal obligations, as recommended by the American Medical Association's Code of Ethics, I have informed the patient of my clinical impression; the nature and purpose of the treatment or procedure; the risks, benefits, and possible complications of the intervention; the alternatives, including doing nothing; the risk(s) and benefit(s) of the alternative treatment(s) or procedure(s); and the risk(s) and benefit(s) of doing nothing. The patient was provided information about the general risks and possible complications associated with the procedure. These may include, but are not limited to: failure to achieve desired goals, infection, bleeding, organ or nerve damage, allergic reactions, paralysis, and death. In addition, the patient was informed of those risks and complications associated to Spine-related procedures, such as failure to decrease pain; infection (i.e.: Meningitis, epidural or intraspinal abscess); bleeding (i.e.: epidural hematoma, subarachnoid hemorrhage, or any other type of intraspinal or peri-dural bleeding); organ or nerve damage (i.e.: Any type of peripheral nerve, nerve root, or spinal cord injury) with subsequent damage to sensory, motor, and/or autonomic systems, resulting in permanent pain, numbness, and/or weakness of one or several areas of the body; allergic reactions; (i.e.: anaphylactic reaction); and/or death. Furthermore, the patient was informed of those risks and complications associated with the medications. These  include, but are not limited to: allergic reactions (i.e.: anaphylactic or anaphylactoid reaction(s)); adrenal axis suppression; blood sugar elevation that in diabetics may result in ketoacidosis or comma; water retention that in patients with history of congestive heart failure may result in shortness of breath, pulmonary edema, and decompensation with resultant heart failure; weight gain; swelling or edema; medication-induced neural toxicity; particulate matter embolism and blood vessel occlusion with resultant organ, and/or nervous system infarction; and/or aseptic necrosis of one or more joints. Finally, the patient was informed that Medicine is not an exact science; therefore, there is also the possibility of unforeseen or unpredictable risks and/or possible complications that may result in a catastrophic outcome. The patient indicated having understood very clearly. We have given the patient no guarantees and we have made no promises. Enough time was given to the patient to ask questions, all of which were answered to the patient's satisfaction. Ms. Garant has indicated that she wanted to continue with the procedure. Attestation: I, the ordering provider, attest that I have discussed with the patient the benefits, risks, side-effects, alternatives, likelihood of achieving goals, and potential problems during recovery for the procedure that I have provided informed consent. Date  Time: 02/04/2020 10:33 AM  Pre-Procedure Preparation:  Monitoring: As per clinic protocol. Respiration, ETCO2, SpO2, BP, heart rate and rhythm monitor placed and checked for adequate function Safety Precautions: Patient was assessed for positional comfort and pressure points before starting the procedure. Time-out: I initiated and conducted the "Time-out" before starting the procedure, as per protocol. The patient was asked to participate by confirming the accuracy of the "Time Out" information. Verification of the correct  person, site, and procedure were performed and confirmed by me, the nursing staff, and the patient. "Time-out" conducted as per Joint Commission's Universal Protocol (UP.01.01.01). Time: 1139  Description of Procedure:          Laterality: Left Levels:  L2, L3, L4, L5, & S1 Medial Branch Level(s), at the L3-4, L4-5, and the L5-S1 lumbar facet joints. Area Prepped: Lumbosacral DuraPrep (Iodine Povacrylex [0.7% available iodine] and  Isopropyl Alcohol, 74% w/w) Safety Precautions: Aspiration looking for blood return was conducted prior to all injections. At no point did we inject any substances, as a needle was being advanced. Before injecting, the patient was told to immediately notify me if she was experiencing any new onset of "ringing in the ears, or metallic taste in the mouth". No attempts were made at seeking any paresthesias. Safe injection practices and needle disposal techniques used. Medications properly checked for expiration dates. SDV (single dose vial) medications used. After the completion of the procedure, all disposable equipment used was discarded in the proper designated medical waste containers. Local Anesthesia: Protocol guidelines were followed. The patient was positioned over the fluoroscopy table. The area was prepped in the usual manner. The time-out was completed. The target area was identified using fluoroscopy. A 12-in long, straight, sterile hemostat was used with fluoroscopic guidance to locate the targets for each level blocked. Once located, the skin was marked with an approved surgical skin marker. Once all sites were marked, the skin (epidermis, dermis, and hypodermis), as well as deeper tissues (fat, connective tissue and muscle) were infiltrated with a small amount of a short-acting local anesthetic, loaded on a 10cc syringe with a 25G, 1.5-in  Needle. An appropriate amount of time was allowed for local anesthetics to take effect before proceeding to the next step. Local  Anesthetic: Lidocaine 2.0% The unused portion of the local anesthetic was discarded in the proper designated containers. Technical explanation of process:  Radiofrequency Ablation (RFA) L2 Medial Branch Nerve RFA: The target area for the L2 medial branch is at the junction of the postero-lateral aspect of the superior articular process and the superior, posterior, and medial edge of the transverse process of L3. Under fluoroscopic guidance, a Radiofrequency needle was inserted until contact was made with os over the superior postero-lateral aspect of the pedicular shadow (target area). Sensory and motor testing was conducted to properly adjust the position of the needle. Once satisfactory placement of the needle was achieved, the numbing solution was slowly injected after negative aspiration for blood. 2.0 mL of the nerve block solution was injected without difficulty or complication. After waiting for at least 3 minutes, the ablation was performed. Once completed, the needle was removed intact. L3 Medial Branch Nerve RFA: The target area for the L3 medial branch is at the junction of the postero-lateral aspect of the superior articular process and the superior, posterior, and medial edge of the transverse process of L4. Under fluoroscopic guidance, a Radiofrequency needle was inserted until contact was made with os over the superior postero-lateral aspect of the pedicular shadow (target area). Sensory and motor testing was conducted to properly adjust the position of the needle. Once satisfactory placement of the needle was achieved, the numbing solution was slowly injected after negative aspiration for blood. 2.0 mL of the nerve block solution was injected without difficulty or complication. After waiting for at least 3 minutes, the ablation was performed. Once completed, the needle was removed intact. L4 Medial Branch Nerve RFA: The target area for the L4 medial branch is at the junction of the  postero-lateral aspect of the superior articular process and the superior, posterior, and medial edge of the transverse process of L5. Under fluoroscopic guidance, a Radiofrequency needle was inserted until contact was made with os over the superior postero-lateral aspect of the pedicular shadow (target area). Sensory and motor testing was conducted to properly adjust the position of the needle. Once satisfactory placement of the needle  was achieved, the numbing solution was slowly injected after negative aspiration for blood. 2.0 mL of the nerve block solution was injected without difficulty or complication. After waiting for at least 3 minutes, the ablation was performed. Once completed, the needle was removed intact. L5 Medial Branch Nerve RFA: The target area for the L5 medial branch is at the junction of the postero-lateral aspect of the superior articular process of S1 and the superior, posterior, and medial edge of the sacral ala. Under fluoroscopic guidance, a Radiofrequency needle was inserted until contact was made with os over the superior postero-lateral aspect of the pedicular shadow (target area). Sensory and motor testing was conducted to properly adjust the position of the needle. Once satisfactory placement of the needle was achieved, the numbing solution was slowly injected after negative aspiration for blood. 2.0 mL of the nerve block solution was injected without difficulty or complication. After waiting for at least 3 minutes, the ablation was performed. Once completed, the needle was removed intact. S1 Medial Branch Nerve RFA: The target area for the S1 medial branch is located inferior to the junction of the S1 superior articular process and the L5 inferior articular process, posterior, inferior, and lateral to the 6 o'clock position of the L5-S1 facet joint, just superior to the S1 posterior foramen. Under fluoroscopic guidance, the Radiofrequency needle was advanced until contact was made  with os over the Target area. Sensory and motor testing was conducted to properly adjust the position of the needle. Once satisfactory placement of the needle was achieved, the numbing solution was slowly injected after negative aspiration for blood. 2.0 mL of the nerve block solution was injected without difficulty or complication. After waiting for at least 3 minutes, the ablation was performed. Once completed, the needle was removed intact. Radiofrequency lesioning (ablation):  Radiofrequency Generator: NeuroTherm [id number] Sensory Stimulation Parameters: 50 Hz was used to locate & identify the nerve, making sure that the needle was positioned such that there was no sensory stimulation below 0.3 V or above 0.7 V. Motor Stimulation Parameters: 2 Hz was used to evaluate the motor component. Care was taken not to lesion any nerves that demonstrated motor stimulation of the lower extremities at an output of less than 2.5 times that of the sensory threshold, or a maximum of 2.0 V. Lesioning Technique Parameters: Standard Radiofrequency settings. (Not bipolar or pulsed.) Temperature Settings: 80 degrees C Lesioning time: 60 seconds Intra-operative Compliance: Compliant Materials & Medications: Needle(s) (Electrode/Cannula) Type: Teflon-coated, curved tip, Radiofrequency needle(s) Gauge: 22G Length: 10cm Numbing solution: 0.2% PF-Ropivacaine + Triamcinolone (40 mg/mL) diluted to a final concentration of 4 mg of Triamcinolone/mL of Ropivacaine The unused portion of the solution was discarded in the proper designated containers.  Once the entire procedure was completed, the treated area was cleaned, making sure to leave some of the prepping solution back to take advantage of its long term bactericidal properties.  Illustration of the posterior view of the lumbar spine and the posterior neural structures. Laminae of L2 through S1 are labeled. DPRL5, dorsal primary ramus of L5; DPRS1, dorsal primary ramus of  S1; DPR3, dorsal primary ramus of L3; FJ, facet (zygapophyseal) joint L3-L4; I, inferior articular process of L4; LB1, lateral branch of dorsal primary ramus of L1; IAB, inferior articular branches from L3 medial branch (supplies L4-L5 facet joint); IBP, intermediate branch plexus; MB3, medial branch of dorsal primary ramus of L3; NR3, third lumbar nerve root; S, superior articular process of L5; SAB, superior articular branches from L4 (  supplies L4-5 facet joint also); TP3, transverse process of L3.  Vitals:   02/04/20 1213 02/04/20 1218 02/04/20 1228 02/04/20 1238  BP: 122/89 125/83 118/83 120/81  Pulse:      Resp: 17 16 16 17   Temp:  (!) 97.5 F (36.4 C)  (!) 97.5 F (36.4 C)  TempSrc:      SpO2: 96% 96% 97% 98%  Weight:      Height:       Start Time: 1139 hrs. End Time: 1213 hrs.  Imaging Guidance (Spinal):          Type of Imaging Technique: Fluoroscopy Guidance (Spinal) Indication(s): Assistance in needle guidance and placement for procedures requiring needle placement in or near specific anatomical locations not easily accessible without such assistance. Exposure Time: Please see nurses notes. Contrast: None used. Fluoroscopic Guidance: I was personally present during the use of fluoroscopy. "Tunnel Vision Technique" used to obtain the best possible view of the target area. Parallax error corrected before commencing the procedure. "Direction-depth-direction" technique used to introduce the needle under continuous pulsed fluoroscopy. Once target was reached, antero-posterior, oblique, and lateral fluoroscopic projection used confirm needle placement in all planes. Images permanently stored in EMR. Interpretation: No contrast injected. I personally interpreted the imaging intraoperatively. Adequate needle placement confirmed in multiple planes. Permanent images saved into the patient's record.  Antibiotic Prophylaxis:   Anti-infectives (From admission, onward)   None      Indication(s): None identified  Post-operative Assessment:  Post-procedure Vital Signs:  Pulse/HCG Rate: 8675 Temp: (!) 97.5 F (36.4 C) Resp: 17 BP: 120/81 SpO2: 98 %  EBL: None  Complications: No immediate post-treatment complications observed by team, or reported by patient.  Note: The patient tolerated the entire procedure well. A repeat set of vitals were taken after the procedure and the patient was kept under observation following institutional policy, for this type of procedure. Post-procedural neurological assessment was performed, showing return to baseline, prior to discharge. The patient was provided with post-procedure discharge instructions, including a section on how to identify potential problems. Should any problems arise concerning this procedure, the patient was given instructions to immediately contact us, at any time, without hesitation. In any case, we plan to contact the patient by telephone for a follow-up status report regarding this interventional procedure.  Comments:  No additional relevant information.  Plan of Care  Orders:  Orders Placed This Encounter  Procedures  . Radiofrequency,Lumbar    Scheduling Instructions:     Side(s): Left-sided     Level: L3-4, L4-5, & L5-S1 Facets (L2, L3, L4, L5, & S1 Medial Branch Nerves)     Sedation: Patient's choice.     Timeframe: Today    Order Specific Question:   Where will this procedure be performed?    Answer:   [facility]  . [facility] C-ARM 1-60 MIN NO REPORT    Intraoperative interpretation by procedural physician at [place].    Standing Status:   Standing    Number of Occurrences:   1    Order Specific Question:   Reason for exam:    Answer:   Assistance in needle guidance and placement for procedures requiring needle placement in or near specific anatomical locations not easily accessible without such assistance.  . Informed Consent Details: Physician/Practitioner  Attestation; Transcribe to consent form and obtain patient signature    Nursing Order: Transcribe to consent form and obtain patient signature. Note: Always confirm laterality of pain with [name],  before procedure. Procedure: Lumbar Facet Radiofrequency Ablation Indication/Reason: Low Back Pain, with our without leg pain, due to Facet Joint Arthralgia (Joint Pain) known as Lumbar Facet Syndrome, secondary to Lumbar, and/or Lumbosacral Spondylosis (Arthritis of the Spine), without myelopathy or radiculopathy (Nerve Damage). Provider Attestation: I, Menlo Dossie Arbour, MD, (Pain Management Specialist), the physician/practitioner, attest that I have discussed with the patient the benefits, risks, side effects, alternatives, likelihood of achieving goals and potential problems during recovery for the procedure that I have provided informed consent.  . Provide equipment / supplies at bedside    Equipment required: Sterile "Radiofrequency Tray"; Large hemostat (1); Small hemostat (1); Towels (6-8); 4x4 sterile sponge pack (1) Radiofrequency Needle(s): Size: Regular Quantity: 5    Standing Status:   Standing    Number of Occurrences:   1    Order Specific Question:   Specify    Answer:   Radiofrequency Tray   Chronic Opioid Analgesic:  Hydrocodone/APAP 7.5/325 1 tablet p.o. 3 times daily (22.5 mg/day of hydrocodone)  MME/day:22.5mg /day.   Medications ordered for procedure: Meds ordered this encounter  Medications  . lidocaine (XYLOCAINE) 2 % (with pres) injection 400 mg  . lactated ringers infusion 1,000 mL  . midazolam (VERSED) 5 MG/5ML injection 1-2 mg    Make sure Flumazenil is available in the pyxis when using this medication. If oversedation occurs, administer 0.2 mg IV over 15 sec. If after 45 sec no response, administer 0.2 mg again over 1 min; may repeat at 1 min intervals; not to exceed 4 doses (1 mg)  . fentaNYL (SUBLIMAZE) injection 25-50 mcg    Make sure Narcan is available  in the pyxis when using this medication. In the event of respiratory depression (RR< 8/min): Titrate NARCAN (naloxone) in increments of 0.1 to 0.2 mg IV at 2-3 minute intervals, until desired degree of reversal.  . triamcinolone acetonide (KENALOG-40) injection 40 mg  . ropivacaine (PF) 2 mg/mL (0.2%) (NAROPIN) injection 9 mL  . oxyCODONE-acetaminophen (PERCOCET) 5-325 MG tablet    Sig: Take 1 tablet by mouth every 8 (eight) hours as needed for up to 7 days for severe pain. Must last 7 days.    Dispense:  21 tablet    Refill:  0    For acute post-operative pain. Not to be refilled.  Must last 7 days.  Marland Kitchen oxyCODONE-acetaminophen (PERCOCET) 5-325 MG tablet    Sig: Take 1 tablet by mouth every 8 (eight) hours as needed for up to 7 days for severe pain. Must last 7 days.    Dispense:  21 tablet    Refill:  0    For acute post-operative pain. Not to be refilled.  Must last 7 days.   Medications administered: We administered lidocaine, lactated ringers, midazolam, fentaNYL, triamcinolone acetonide, and ropivacaine (PF) 2 mg/mL (0.2%).  See the medical record for exact dosing, route, and time of administration.  Follow-up plan:   Return in about 6 weeks (around 03/17/2020) for VV(15-min), (PP), pm on procedure day.       Considering: Therapeutic bilateral lumbar facetRFA #1  [place] joint Blk PossiblebilateralSI joint RFA Diagnostic bilateral L5 TFESI Diagnostic left-sided L2 TFESI   PRN Procedures: Palliative bilateral lumbar facet block #3 Palliative right lumbar facet RFA #2 (last done 01/07/2020) Palliative left lumbar facet RFA #2 (last done 02/04/2020)     Recent Visits Date Type Provider Dept  01/07/20 Procedure visit Milinda Pointer, [facility]  12/12/19 Telemedicine Milinda Pointer, [facility]  11/07/19  Telemedicine Milinda Pointer, MD [facility]  Showing recent visits within past 90 days and meeting all  other requirements Today's Visits Date Type Provider Dept  02/04/20 Procedure visit Milinda Pointer, MD [facility]  Showing today's visits and meeting all other requirements Future Appointments Date Type Provider Dept  02/11/20 Appointment Milinda Pointer, MD [facility]  03/17/20 Appointment Milinda Pointer, MD [facility]  Showing future appointments within next 90 days and meeting all other requirements  Disposition: Discharge home  Discharge (Date  Time): 02/04/2020; 1238 hrs.   Primary Care Physician: Elby Beck, FNP Location: [facility] Note by: [name], MD Date: 02/04/2020; Time: 4:31 PM  Disclaimer:  Medicine is not an Chief Strategy Officer. The only guarantee in medicine is that nothing is guaranteed. It is important to note that the decision to proceed with this intervention was based on the information collected from the patient. The Data and conclusions were drawn from the patient's questionnaire, the interview, and the physical examination. Because the information was provided in large part by the patient, it cannot be guaranteed that it has not been purposely or unconsciously manipulated. Every effort has been made to obtain as much relevant data as possible for this evaluation. It is important to note that the conclusions that lead to this procedure are derived in large part from the available data. Always take into account that the treatment will also be dependent on availability of resources and existing treatment guidelines, considered by other Pain Management Practitioners as being common knowledge and practice, at the time of the intervention. For Medico-Legal purposes, it is also important to point out that variation in procedural techniques and pharmacological choices are the acceptable norm. The indications, contraindications, technique, and results of the above procedure should only be  interpreted and judged by a Board-Certified Interventional Pain Specialist with extensive familiarity and expertise in the same exact procedure and technique.

## 2020-02-05 ENCOUNTER — Telehealth: Payer: Self-pay | Admitting: *Deleted

## 2020-02-05 NOTE — Telephone Encounter (Signed)
Attempted to call for post procedure follow-up. Message left. 

## 2020-02-10 ENCOUNTER — Telehealth: Payer: Self-pay

## 2020-02-10 NOTE — Telephone Encounter (Signed)
She has a procedure last week and is in a lot of pain that is not normal pain. Please call her.

## 2020-02-10 NOTE — Progress Notes (Signed)
PROVIDER NOTE: Information contained herein reflects review and annotations entered in association with encounter. Interpretation of such information and data should be left to medically-trained personnel. Information provided to patient can be located elsewhere in the medical record under "Patient Instructions". Document created using STT-dictation technology, any transcriptional errors that may result from process are unintentional.    Patient: Denise Burns  Service Category: E/M  Provider: [name], MD  DOB: [birth year]  DOS: 02/11/2020  Specialty: Interventional Pain Management  MRN: [id number]  Setting: Ambulatory outpatient  PCP: Elby Beck, FNP  Type: Established Patient    Referring Provider: Elby Beck, FNP  Location: Office  Delivery: Face-to-face     HPI  Reason for encounter: Ms. Christle Nolting, a 49 y.o. year old female, is here today for evaluation and management of her Chronic pain syndrome [G89.4]. Ms. Emory's primary complain today is Back Pain (middle) and Hip Pain (right, left) Last encounter: Practice (02/10/2020). My last encounter with her was on 02/04/2020. Pertinent problems: Ms. Schmuck has Chronic midline low back pain with bilateral sciatica; Chronic low back pain (Primary Area of Pain) (Bilateral) (R>L) w/o sciatica; Chronic sacroiliac joint pain (Secondary Area of Pain) (Right); Chronic pain syndrome; Abnormal MRI, lumbar spine (03/22/2018); Lumbar facet hypertrophy (Multilevel) (Bilateral); Lumbar facet syndrome (Bilateral); DDD (degenerative disc disease), lumbosacral; Spondylolisthesis (8 mm) at L5-S1 level; Lumbar Grade 1 Retrolisthesis (2 mm) of L2/L3; L5-S1 pars defect w/ spondylolisthesis (Bilateral); Lumbar pars defect (L5-S1) (Bilateral); Lumbar foraminal stenosis (Left: L2-3) (Bilateral: L5-S1); Osteoarthritis of sacroiliac joint (Bilateral); Chronic musculoskeletal pain; Neurogenic pain; Osteoarthritis of facet joint of lumbar spine; Greater  trochanteric bursitis (Bilateral); Lumbar spondylosis; Spondylosis without myelopathy or radiculopathy, lumbosacral region; Acute postoperative pain; and Chronic hip pain (Right) on their pertinent problem list. Pain Assessment: Severity of Chronic pain is reported as a 7 /10. Location: Back Mid/denies. Onset: More than a month ago. Quality: Aching. Timing: Constant. Modifying factor(s): medications, heat. Vitals:  height is _0  (1.626 m) and weight is 172 lb (78 kg). Her temporal temperature is 97.7 F (36.5 C). Her blood pressure is 150/96 (abnormal) and her pulse is 80. Her respiration is 16 and oxygen saturation is 100%.    The patient indicates doing well with the current medication regimen. No adverse reactions or side effects reported to the medications.  According to the patient, she has had no pain on the left side since the recent left lumbar facet RFA.  However, she still having pain on the right side.  Today she indicates the pain to be in the area over the trochanteric bursa which led me to believe that there was something else.  Physical exam today with provocative maneuvers thought to be positive bilaterally for hip joint pain, but much more significant on the right side.  In view of today's physical exam findings, I have ordered an x-ray of the right hip and I will follow up with an intra-articular hip joint injection to see if we can eliminate that particular pain.  Post-Procedure Evaluation  Procedure: (02/04/2020) therapeutic left lumbar facet RFA #1 under fluoroscopic guidance and IV sedation.  The right side had been done on 01/07/2020. Pre-procedure pain level: 4/10 Post-procedure: 0/10 (100% relief)  Sedation: Sedation provided.  Effectiveness during initial hour after procedure(Ultra-Short Term Relief): 100 %.  Local anesthetic used: Long-acting (4-6 hours) Effectiveness: Defined as any analgesic benefit obtained secondary to the administration of local anesthetics. This carries  significant diagnostic value as to the etiological location, or anatomical  origin, of the pain. Duration of benefit is expected to coincide with the duration of the local anesthetic used.  Effectiveness during initial 4-6 hours after procedure(Short-Term Relief): 100 %.  Long-term benefit: Defined as any relief past the pharmacologic duration of the local anesthetics.  Effectiveness past the initial 6 hours after procedure(Long-Term Relief): 90 %.  Current benefits: Defined as benefit that persist at this time.   Analgesia:  >75% relief Function: Ms. J[place]lin reports improvement in function ROM: Ms. Newcomer reports improvement in ROM  Pharmacotherapy Assessment   Analgesic: Hydrocodone/APAP 7.5/325 1 tablet p.o. 3 times daily (22.5 mg/day of hydrocodone)  MME/day:22.75m/day.   Monitoring: [place] PMP: PDMP reviewed during this encounter.       Pharmacotherapy: No side-effects or adverse reactions reported. Compliance: No problems identified. Effectiveness: Clinically acceptable.  WLandis Martins RN  02/11/2020  2:27 PM  Sign when Signing Visit Nursing Pain Medication Assessment:  Safety precautions to be maintained throughout the outpatient stay will include: orient to surroundings, keep bed in low position, maintain call bell within reach at all times, provide assistance with transfer out of bed and ambulation.  Medication Inspection Compliance: Ms. CDackdid not comply with our request to bring her pills to be counted. She was reminded that bringing the medication bottles, even when empty, is a requirement.  Medication: None brought in. Pill/Patch Count: None available to be counted. Bottle Appearance: No container available. Did not bring bottle(s) to appointment. Filled Date: N/A Last Medication intake:  Today    UDS:  Summary  Date Value Ref Range Status  08/16/2018 FINAL  Final    Comment:    ==================================================================== TOXAS[facility]RE  COMP DRUG ANALYSIS,UR ==================================================================== Test                             Result       Flag       Units Drug Present and Declared for Prescription Verification   Hydrocodone                    205          EXPECTED   ng/mg creat   Hydromorphone                  671          EXPECTED   ng/mg creat   Dihydrocodeine                 153          EXPECTED   ng/mg creat   Norhydrocodone                 642          EXPECTED   ng/mg creat    Sources of hydrocodone include scheduled prescription    medications. Hydromorphone, dihydrocodeine and norhydrocodone are    expected metabolites of hydrocodone. Hydromorphone and    dihydrocodeine are also available as scheduled prescription    medications.   Ephedrine/Pseudoephedrine      PRESENT      EXPECTED   Phenylpropanolamine            PRESENT      EXPECTED    Source of ephedrine/pseudoephedrine is most commonly    pseudoephedrine in over-the-counter or prescription cold and    allergy medications. Phenylpropanolamine is an expected    metabolite of ephedrine/pseudoephedrine.   Baclofen  PRESENT      EXPECTED   Acetaminophen                  PRESENT      EXPECTED Drug Present not Declared for Prescription Verification   Ibuprofen                      PRESENT      UNEXPECTED   Doxylamine                     PRESENT      UNEXPECTED   Dextrorphan/Levorphanol        PRESENT      UNEXPECTED    Dextrorphan is an expected metabolite of dextromethorphan, an    over-the-counter or prescription cough suppressant. Levorphanol    is a scheduled prescription medication. Dextrorphan cannot be    distinguished from levorphanol by the method used for analysis. ==================================================================== Test                      Result    Flag   Units      Ref Range   Creatinine              234              mg/dL       >=20 ==================================================================== Declared Medications:  The flagging and interpretation on this report are based on the  following declared medications.  Unexpected results may arise from  inaccuracies in the declared medications.  **Note: The testing scope of this panel includes these medications:  Baclofen  Hydrocodone (Hydrocodone-Acetaminophen)  Pseudoephedrine (Sudafed)  **Note: The testing scope of this panel does not include small to  moderate amounts of these reported medications:  Acetaminophen (Hydrocodone-Acetaminophen)  **Note: The testing scope of this panel does not include following  reported medications:  Cholecalciferol  Fexofenadine  Fludrocortisone (Florinef)  Hydrocortisone (Cortef) ==================================================================== For clinical consultation, please call [phone]. ====================================================================      ROS  Constitutional: Denies any fever or chills Gastrointestinal: No reported hemesis, hematochezia, vomiting, or acute GI distress Musculoskeletal: Denies any acute onset joint swelling, redness, loss of ROM, or weakness Neurological: No reported episodes of acute onset apraxia, aphasia, dysarthria, agnosia, amnesia, paralysis, loss of coordination, or loss of consciousness  Medication Review  HYDROcodone-acetaminophen, baclofen, cetirizine, fludrocortisone, hydrocortisone, meloxicam, and pseudoephedrine  History Review  Allergy: Ms. Vallas is allergic to lisinopril, ultram [tramadol], and sulfa antibiotics. Drug: Ms. Correia  reports no history of drug use. Alcohol:  reports current alcohol use. Tobacco:  reports that she has quit smoking. She has never used smokeless tobacco. Social: Ms. Benda  reports that she has quit smoking. She has never used smokeless tobacco. She reports current alcohol use. She reports that she does not use  drugs. Medical:  has a past medical history of Addison disease ([place]), Arthritis, Back pain, and GERD (gastroesophageal reflux disease). Surgical: Ms. Sawatzky  has a past surgical history that includes Cholecystectomy and Polypectomy. Family: family history includes Alcohol abuse in her father; Arthritis in her mother; Hearing loss in her father; Hyperlipidemia in her father and mother; Hypertension in her mother.  Laboratory Chemistry Profile   Renal Lab Results  Component Value Date   BUN 16 08/13/2019   CREATININE 0.84 08/13/2019   BCR 18 08/16/2018   GFR 72.11 08/13/2019   GFRAA 86 08/16/2018   GFRNONAA 74 08/16/2018     Hepatic Lab Results  Component Value Date   AST 18 08/16/2018   ALBUMIN 4.4 08/16/2018   ALKPHOS 64 08/16/2018     Electrolytes Lab Results  Component Value Date   NA 141 08/13/2019   K 3.6 08/13/2019   CL 106 08/13/2019   CALCIUM 8.9 08/13/2019   MG 2.1 08/16/2018     Bone Lab Results  Component Value Date   VD25OH 13.55 (L) 04/30/2018   25OHVITD1 67 08/16/2018   25OHVITD2 <1.0 08/16/2018   25OHVITD3 67 08/16/2018     Inflammation (CRP: Acute Phase) (ESR: Chronic Phase) Lab Results  Component Value Date   CRP 2 08/16/2018   ESRSEDRATE 37 (H) 08/16/2018       Note: Above Lab results reviewed.  Recent Imaging Review  DG PAIN CLIN[facility] C-ARM 1-60 MIN NO REPORT Fluoro was used, but no Radiologist interpretation will be provided.  Please refer to "NOTES" tab for provider progress note. Note: Reviewed        Physical Exam  General appearance: Well nourished, well developed, and well hydrated. In no apparent acute distress Mental status: Alert, oriented x 3 (person, place, & time)       Respiratory: No evidence of acute respiratory distress Eyes: PERLA Vitals: BP (!) 150/96   Pulse 80   Temp 97.7 F (36.5 C) (Temporal)   Resp 16   Ht _0  (1.626 m)   Wt 172 lb (78 kg)   LMP 06/08/2018 (Exact Date)   SpO2 100%   BMI 29.52 kg/m   BMI: Estimated body mass index is 29.52 kg/m as calculated from the following:   Height as of this encounter: _1  (1.626 m).   Weight as of this encounter: 172 lb (78 kg). Ideal: Ideal body weight: 54.7 kg (120 lb 9.5 oz) Adjusted ideal body weight: 64 kg (141 lb 2.5 oz)  Assessment   Status Diagnosis  Improving Unimproved Controlled 1. Chronic pain syndrome   2. Chronic hip pain (Right)   3. Chronic low back pain (Primary Area of Pain) (Bilateral) (R>L) w/o sciatica   4. Osteoarthritis of facet joint of lumbar spine   5. Chronic sacroiliac joint pain (Secondary Area of Pain) (Right)   6. Osteoarthritis of sacroiliac joint (Bilateral)   7. Pharmacologic therapy   8. Chronic musculoskeletal pain      Updated Problems: Problem  Chronic hip pain (Right)    Plan of Care  Problem-specific:  No problem-specific Assessment & Plan notes found for this encounter.  Ms. Judah Chevere has a current medication list which includes the following long-term medication(s): cetirizine, hydrocodone-acetaminophen, [START ON 03/12/2020] hydrocodone-acetaminophen, [START ON 04/11/2020] hydrocodone-acetaminophen, meloxicam, and baclofen.  Pharmacotherapy (Medications Ordered): Meds ordered this encounter  Medications  . baclofen (LIORESAL) 10 MG tablet    Sig: Take 1 tablet (10 mg total) by mouth daily.    Dispense:  90 tablet    Refill:  1    Fill one day early if pharmacy is closed on scheduled refill date. May substitute for generic if available.  Marland Kitchen HYDROcodone-acetaminophen (NORCO) 7.5-325 MG tablet    Sig: Take 1 tablet by mouth every 8 (eight) hours as needed for severe pain. Must last 30 days.    Dispense:  90 tablet    Refill:  0    Chronic Pain: STOP Act (Not applicable) Fill 1 day early if closed on refill date. Do not fill until: 02/11/2020. To last until: 03/12/2020. Avoid benzodiazepines within 8 hours of opioids  . meloxicam (MOBIC) 15 MG tablet  Sig: Take 1 tablet (15 mg  total) by mouth daily.    Dispense:  90 tablet    Refill:  1    Fill one day early if pharmacy is closed on scheduled refill date. May substitute for generic if available.  Marland Kitchen HYDROcodone-acetaminophen (NORCO) 7.5-325 MG tablet    Sig: Take 1 tablet by mouth every 8 (eight) hours as needed for severe pain. Must last 30 days.    Dispense:  90 tablet    Refill:  0    Chronic Pain: STOP Act (Not applicable) Fill 1 day early if closed on refill date. Do not fill until: 03/12/2020. To last until: 04/11/2020. Avoid benzodiazepines within 8 hours of opioids  . HYDROcodone-acetaminophen (NORCO) 7.5-325 MG tablet    Sig: Take 1 tablet by mouth every 8 (eight) hours as needed for severe pain. Must last 30 days.    Dispense:  90 tablet    Refill:  0    Chronic Pain: STOP Act (Not applicable) Fill 1 day early if closed on refill date. Do not fill until: 04/11/2020. To last until: 05/11/2020. Avoid benzodiazepines within 8 hours of opioids   Orders:  Orders Placed This Encounter  Procedures  . HIP INJECTION    Standing Status:   Future    Standing Expiration Date:   05/13/2020    Scheduling Instructions:     Side: Right-sided     Sedation: With Sedation.     Timeframe: As soon as schedule allows  . DG HIP UNILAT W OR W/O PELVIS 2-3 VIEWS RIGHT    Standing Status:   Future    Standing Expiration Date:   02/10/2021    Scheduling Instructions:     Please describe any evidence of DJD, such as joint narrowing, asymmetry, cysts, or any anomalies in bone density, production, or erosion.    Order Specific Question:   Reason for Exam (SYMPTOM  OR DIAGNOSIS REQUIRED)    Answer:   Right hip pain/arthralgia    Order Specific Question:   Is the patient pregnant?    Answer:   No    Order Specific Question:   Preferred imaging location?    Answer:   [facility]    Order Specific Question:   Call Results- Best Contact Number?    Answer:   [phone] (Pain Clinic facility) (Dr. Dossie Arbour)  . [employer]  Select 13 (MW), Urine    Volume: 30 ml(s). Minimum 3 ml of urine is needed. Document temperature of fresh sample. Indications: Long term (current) use of opiate analgesic (O75.643)    Order Specific Question:   Release to patient    Answer:   Immediate   Follow-up plan:   Return for Procedure (w/ sedation): (R) IA Hip inj. #1.      Considering: Therapeutic bilateral lumbar facetRFA #1  [place] joint Blk PossiblebilateralSI joint RFA Diagnostic bilateral L5 TFESI Diagnostic left-sided L2 TFESI   PRN Procedures: Palliative bilateral lumbar facet block #3 Palliative right lumbar facet RFA #2 (last done 01/07/2020) Palliative left lumbar facet RFA #2 (last done 02/04/2020)      Recent Visits Date Type Provider Dept  02/04/20 Procedure visit Milinda Pointer, MD [facility]  01/07/20 Procedure visit Milinda Pointer, MD [facility]  12/12/19 Telemedicine Milinda Pointer, MD [facility]  Showing recent visits within past 90 days and meeting all other requirements Today's Visits Date Type Provider Dept  02/11/20 Office Visit Milinda Pointer, MD [facility]  Showing today's visits  and meeting all other requirements Future Appointments Date Type Provider Dept  03/17/20 Appointment Milinda Pointer, MD [facility]  Showing future appointments within next 90 days and meeting all other requirements  I discussed the assessment and treatment plan with the patient. The patient was provided an opportunity to ask questions and all were answered. The patient agreed with the plan and demonstrated an understanding of the instructions.  Patient advised to call back or seek an in-person evaluation if the symptoms or condition worsens.  Duration of encounter: 30 minutes.  Note by: [name], MD Date: 02/11/2020; Time: 2:58 PM

## 2020-02-10 NOTE — Telephone Encounter (Signed)
Spoke with patient and she is having increased back pain in her "SI joints" bilaterally.  She is s/p RFA on the left x 6 days.  We spoke about the increased pain coming from RFA, she feels that it is something different.  She does have pain medication and post procedure pain medication which she has run out of and can't fill again til tomorrow.  She does have an appt tomorrow at 1415 here in the office for medication management. Encouraged patient to keep appt for tomorrow and let Dr Dossie Arbour evaluate the pain.

## 2020-02-11 ENCOUNTER — Encounter: Payer: Self-pay | Admitting: Pain Medicine

## 2020-02-11 ENCOUNTER — Ambulatory Visit (HOSPITAL_BASED_OUTPATIENT_CLINIC_OR_DEPARTMENT_OTHER): Payer: Managed Care, Other (non HMO) | Admitting: Pain Medicine

## 2020-02-11 ENCOUNTER — Ambulatory Visit
Admission: RE | Admit: 2020-02-11 | Discharge: 2020-02-11 | Disposition: A | Discharge status: Home / Self Care | Payer: Managed Care, Other (non HMO) | Source: Ambulatory Visit | Attending: Pain Medicine | Admitting: Pain Medicine

## 2020-02-11 ENCOUNTER — Other Ambulatory Visit: Payer: Self-pay

## 2020-02-11 VITALS — BP 150/96 | HR 80 | Temp 97.7°F | Resp 16 | Ht 64.0 in | Wt 172.0 lb

## 2020-02-11 DIAGNOSIS — M25551 Pain in right hip: Secondary | ICD-10-CM | POA: Insufficient documentation

## 2020-02-11 DIAGNOSIS — G894 Chronic pain syndrome: Secondary | ICD-10-CM

## 2020-02-11 DIAGNOSIS — G8929 Other chronic pain: Secondary | ICD-10-CM

## 2020-02-11 DIAGNOSIS — M545 Low back pain, unspecified: Secondary | ICD-10-CM

## 2020-02-11 DIAGNOSIS — Z79899 Other long term (current) drug therapy: Secondary | ICD-10-CM

## 2020-02-11 DIAGNOSIS — M47816 Spondylosis without myelopathy or radiculopathy, lumbar region: Secondary | ICD-10-CM

## 2020-02-11 DIAGNOSIS — M533 Sacrococcygeal disorders, not elsewhere classified: Secondary | ICD-10-CM

## 2020-02-11 DIAGNOSIS — M461 Sacroiliitis, not elsewhere classified: Secondary | ICD-10-CM

## 2020-02-11 DIAGNOSIS — M7918 Myalgia, other site: Secondary | ICD-10-CM

## 2020-02-11 MED ORDER — HYDROCODONE-ACETAMINOPHEN 7.5-325 MG PO TABS: 1.0000 | ORAL_TABLET | Freq: Three times a day (TID) | ORAL | 0 refills | Status: DC | PRN

## 2020-02-11 MED ORDER — BACLOFEN 10 MG PO TABS: 10.0000 mg | ORAL_TABLET | Freq: Every day | ORAL | 1 refills | Status: DC

## 2020-02-11 MED ORDER — MELOXICAM 15 MG PO TABS: 15.0000 mg | ORAL_TABLET | Freq: Every day | ORAL | 1 refills | Status: DC

## 2020-02-11 NOTE — Patient Instructions (Addendum)
____________________________________________________________________________________________  Drug Holidays (Slow)  What is a "Drug Holiday"? Drug Holiday: is the name given to the period of time during which a patient stops taking a medication(s) for the purpose of eliminating tolerance to the drug.  Benefits . Improved effectiveness of opioids. . Decreased opioid dose needed to achieve benefits. . Improved pain with lesser dose.  What is tolerance? Tolerance: is the progressive decreased in effectiveness of a drug due to its repetitive use. With repetitive use, the body gets use to the medication and as a consequence, it loses its effectiveness. This is a common problem seen with opioid pain medications. As a result, a larger dose of the drug is needed to achieve the same effect that used to be obtained with a smaller dose.  How long should a "Drug Holiday" last? You should stay off of the pain medicine for at least 14 consecutive days. (2 weeks)  Should I stop the medicine "cold turkey"? No. You should always coordinate with your Pain Specialist so that he/she can provide you with the correct medication dose to make the transition as smoothly as possible.  How do I stop the medicine? Slowly. You will be instructed to decrease the daily amount of pills that you take by one (1) pill every seven (7) days. This is called a "slow downward taper" of your dose. For example: if you normally take four (4) pills per day, you will be asked to drop this dose to three (3) pills per day for seven (7) days, then to two (2) pills per day for seven (7) days, then to one (1) per day for seven (7) days, and at the end of those last seven (7) days, this is when the "Drug Holiday" would start.   Will I have withdrawals? By doing a "slow downward taper" like this one, it is unlikely that you will experience any significant withdrawal symptoms. Typically, what triggers withdrawals is the sudden stop of a high  dose opioid therapy. Withdrawals can usually be avoided by slowly decreasing the dose over a prolonged period of time.  What are withdrawals? Withdrawals: refers to the wide range of symptoms that occur after stopping or dramatically reducing opiate drugs after heavy and prolonged use. Withdrawal symptoms do not occur to patients that use low dose opioids, or those who take the medication sporadically. Contrary to benzodiazepine (example: Valium, Xanax, etc.) or alcohol withdrawals ("Delirium Tremens"), opioid withdrawals are not lethal. Withdrawals are the physical manifestation of the body getting rid of the excess receptors.  Expected Symptoms Early symptoms of withdrawal may include: . Agitation . Anxiety . Muscle aches . Increased tearing . Insomnia . Runny nose . Sweating . Yawning  Late symptoms of withdrawal may include: . Abdominal cramping . Diarrhea . Dilated pupils . Goose bumps . Nausea . Vomiting  Will I experience withdrawals? Due to the slow nature of the taper, it is very unlikely that you will experience any.  What is a slow taper? Taper: refers to the gradual decrease in dose.  ___________________________________________________________________________________________    ____________________________________________________________________________________________  Medication Rules  Purpose: To inform patients, and their family members, of our rules and regulations.  Applies to: All patients receiving prescriptions (written or electronic).  Pharmacy of record: Pharmacy where electronic prescriptions will be sent. If written prescriptions are taken to a different pharmacy, please inform the nursing staff. The pharmacy listed in the electronic medical record should be the one where you would like electronic prescriptions to be sent.  Electronic   prescriptions: In compliance with the [place] Strengthen Opioid Misuse Prevention (STOP) Act of 2017 (Session  Law 2017-74/H243), effective August 01, 2018, all controlled substances must be electronically prescribed. Calling prescriptions to the pharmacy will cease to exist.  Prescription refills: Only during scheduled appointments. Applies to all prescriptions.  NOTE: The following applies primarily to controlled substances (Opioid* Pain Medications).   Type of encounter (visit): For patients receiving controlled substances, face-to-face visits are required. (Not an option or up to the patient.)  Patient's responsibilities: 1. Pain Pills: Bring all pain pills to every appointment (except for procedure appointments). 2. Pill Bottles: Bring pills in original pharmacy bottle. Always bring the newest bottle. Bring bottle, even if empty. 3. Medication refills: You are responsible for knowing and keeping track of what medications you take and those you need refilled. The day before your appointment: write a list of all prescriptions that need to be refilled. The day of the appointment: give the list to the admitting nurse. Prescriptions will be written only during appointments. No prescriptions will be written on procedure days. If you forget a medication: it will not be "Called in", "Faxed", or "electronically sent". You will need to get another appointment to get these prescribed. No early refills. Do not call asking to have your prescription filled early. 4. Prescription Accuracy: You are responsible for carefully inspecting your prescriptions before leaving our office. Have the discharge nurse carefully go over each prescription with you, before taking them home. Make sure that your name is accurately spelled, that your address is correct. Check the name and dose of your medication to make sure it is accurate. Check the number of pills, and the written instructions to make sure they are clear and accurate. Make sure that you are given enough medication to last until your next medication refill  appointment. 5. Taking Medication: Take medication as prescribed. When it comes to controlled substances, taking less pills or less frequently than prescribed is permitted and encouraged. Never take more pills than instructed. Never take medication more frequently than prescribed.  6. Inform other Doctors: Always inform, all of your healthcare providers, of all the medications you take. 7. Pain Medication from other Providers: You are not allowed to accept any additional pain medication from any other Doctor or Healthcare provider. There are two exceptions to this rule. (see below) In the event that you require additional pain medication, you are responsible for notifying us, as stated below. 8. Medication Agreement: You are responsible for carefully reading and following our Medication Agreement. This must be signed before receiving any prescriptions from our practice. Safely store a copy of your signed Agreement. Violations to the Agreement will result in no further prescriptions. (Additional copies of our Medication Agreement are available upon request.) 9. Laws, Rules, & Regulations: All patients are expected to follow all Federal and State Laws, Statutes, Rules, & Regulations. Ignorance of the Laws does not constitute a valid excuse.  10. Illegal drugs and Controlled Substances: The use of illegal substances (including, but not limited to marijuana and its derivatives) and/or the illegal use of any controlled substances is strictly prohibited. Violation of this rule may result in the immediate and permanent discontinuation of any and all prescriptions being written by our practice. The use of any illegal substances is prohibited. 11. Adopted CDC guidelines & recommendations: Target dosing levels will be at or below 60 MME/day. Use of benzodiazepines** is not recommended.  Exceptions: There are only two exceptions to the rule of not   receiving pain medications from other Healthcare  Providers. 1. Exception #1 (Emergencies): In the event of an emergency (i.e.: accident requiring emergency care), you are allowed to receive additional pain medication. However, you are responsible for: As soon as you are able, call our office [phone], at any time of the day or night, and leave a message stating your name, the date and nature of the emergency, and the name and dose of the medication prescribed. In the event that your call is answered by a member of our staff, make sure to document and save the date, time, and the name of the person that took your information.  2. Exception #2 (Planned Surgery): In the event that you are scheduled by another doctor or dentist to have any type of surgery or procedure, you are allowed (for a period no longer than 30 days), to receive additional pain medication, for the acute post-op pain. However, in this case, you are responsible for picking up a copy of our "Post-op Pain Management for Surgeons" handout, and giving it to your surgeon or dentist. This document is available at our office, and does not require an appointment to obtain it. Simply go to our office during business hours (Monday-Thursday from 8:00 AM to 4:00 PM) (Friday 8:00 AM to 12:00 Noon) or if you have a scheduled appointment with us, prior to your surgery, and ask for it by name. In addition, you will need to provide us with your name, name of your surgeon, type of surgery, and date of procedure or surgery.  *Opioid medications include: morphine, codeine, oxycodone, oxymorphone, hydrocodone, hydromorphone, meperidine, tramadol, tapentadol, buprenorphine, fentanyl, methadone. **Benzodiazepine medications include: diazepam (Valium), alprazolam (Xanax), clonazepam (Klonopine), lorazepam (Ativan), clorazepate (Tranxene), chlordiazepoxide (Librium), estazolam (Prosom), oxazepam (Serax), temazepam (Restoril), triazolam (Halcion) (Last updated:  09/28/2017) ____________________________________________________________________________________________   ____________________________________________________________________________________________  Medication Recommendations and Reminders  Applies to: All patients receiving prescriptions (written and/or electronic).  Medication Rules & Regulations: These rules and regulations exist for your safety and that of others. They are not flexible and neither are we. Dismissing or ignoring them will be considered "non-compliance" with medication therapy, resulting in complete and irreversible termination of such therapy. (See document titled "Medication Rules" for more details.) In all conscience, because of safety reasons, we cannot continue providing a therapy where the patient does not follow instructions.  Pharmacy of record:   Definition: This is the pharmacy where your electronic prescriptions will be sent.   We do not endorse any particular pharmacy.  You are not restricted in your choice of pharmacy.  The pharmacy listed in the electronic medical record should be the one where you want electronic prescriptions to be sent.  If you choose to change pharmacy, simply notify our nursing staff of your choice of new pharmacy.  Recommendations:  Keep all of your pain medications in a safe place, under lock and key, even if you live alone.   After you fill your prescription, take 1 week's worth of pills and put them away in a safe place. You should keep a separate, properly labeled bottle for this purpose. The remainder should be kept in the original bottle. Use this as your primary supply, until it runs out. Once it's gone, then you know that you have 1 week's worth of medicine, and it is time to come in for a prescription refill. If you do this correctly, it is unlikely that you will ever run out of medicine.  To make sure that the above recommendation works,   it is very important that you  make sure your medication refill appointments are scheduled at least 1 week before you run out of medicine. To do this in an effective manner, make sure that you do not leave the office without scheduling your next medication management appointment. Always ask the nursing staff to show you in your prescription , when your medication will be running out. Then arrange for the receptionist to get you a return appointment, at least 7 days before you run out of medicine. Do not wait until you have 1 or 2 pills left, to come in. This is very poor planning and does not take into consideration that we may need to cancel appointments due to bad weather, sickness, or emergencies affecting our staff.  "Partial Fill": If for any reason your pharmacy does not have enough pills/tablets to completely fill or refill your prescription, do not allow for a "partial fill". You will need a separate prescription to fill the remaining amount, which we will not provide. If the reason for the partial fill is your insurance, you will need to talk to the pharmacist about payment alternatives for the remaining tablets, but again, do not accept a partial fill.  Prescription refills and/or changes in medication(s):   Prescription refills, and/or changes in dose or medication, will be conducted only during scheduled medication management appointments. (Applies to both, written and electronic prescriptions.)  No refills on procedure days. No medication will be changed or started on procedure days. No changes, adjustments, and/or refills will be conducted on a procedure day. Doing so will interfere with the diagnostic portion of the procedure.  No phone refills. No medications will be "called into the pharmacy".  No Fax refills.  No weekend refills.  No Holliday refills.  No after hours refills.  Remember:  Business hours are:  Monday to Thursday 8:00 AM to 4:00 PM Provider's Schedule: Rajan Burgard, MD - Appointments  are:  Medication management: Monday and Wednesday 8:00 AM to 4:00 PM Procedure day: Tuesday and Thursday 7:30 AM to 4:00 PM Bilal Lateef, MD - Appointments are:  Medication management: Tuesday and Thursday 8:00 AM to 4:00 PM Procedure day: Monday and Wednesday 7:30 AM to 4:00 PM (Last update: 09/28/2017) ____________________________________________________________________________________________   ____________________________________________________________________________________________  CANNABIDIOL (AKA: CBD Oil or Pills)  Applies to: All patients receiving prescriptions of controlled substances (written and/or electronic).  General Information: Cannabidiol (CBD) was discovered in 1940. It is one of some 113 identified cannabinoids in cannabis (Marijuana) plants, accounting for up to 40% of the plant's extract. As of 2018, preliminary clinical research on cannabidiol included studies of anxiety, cognition, movement disorders, and pain.  Cannabidiol is consummed in multiple ways, including inhalation of cannabis smoke or vapor, as an aerosol spray into the cheek, and by mouth. It may be supplied as CBD oil containing CBD as the active ingredient (no added tetrahydrocannabinol (THC) or terpenes), a full-plant CBD-dominant hemp extract oil, capsules, dried cannabis, or as a liquid solution. CBD is thought not have the same psychoactivity as THC, and may affect the actions of THC. Studies suggest that CBD may interact with different biological targets, including cannabinoid receptors and other neurotransmitter receptors. As of 2018 the mechanism of action for its biological effects has not been determined.  In the [place], cannabidiol has a limited approval by the Food and Drug Administration (FDA) for treatment of only two types of epilepsy disorders. The side effects of long-term use of the drug include somnolence, decreased appetite, diarrhea,   fatigue, malaise, weakness, sleeping  problems, and others.  CBD remains a Schedule I drug prohibited for any use.  Legality: Some manufacturers ship CBD products nationally, an illegal action which the FDA has not enforced in 2018, with CBD remaining the subject of an FDA investigational new drug evaluation, and is not considered legal as a dietary supplement or food ingredient as of December 2018. Federal illegality has made it difficult historically to conduct research on CBD. CBD is openly sold in head shops and health food stores in some states where such sales have not been explicitly legalized.  Warning: Because it is not FDA approved for general use or treatment of pain, it is not required to undergo the same manufacturing controls as prescription drugs.  This means that the available cannabidiol (CBD) may be contaminated with THC.  If this is the case, it will trigger a positive urine drug screen (UDS) test for cannabinoids (Marijuana).  Because a positive UDS for illicit substances is a violation of our medication agreement, your opioid analgesics (pain medicine) may be permanently discontinued. (Last update: 10/19/2017) ____________________________________________________________________________________________   ____________________________________________________________________________________________  Preparing for Procedure with Sedation  Procedure appointments are limited to planned procedures: . No Prescription Refills. . No disability issues will be discussed. . No medication changes will be discussed.  Instructions: . Oral Intake: Do not eat or drink anything for at least 8 hours prior to your procedure. (Exception: Blood Pressure Medication. See below.) . Transportation: Unless otherwise stated by your physician, you may drive yourself after the procedure. . Blood Pressure Medicine: Do not forget to take your blood pressure medicine with a sip of water the morning of the procedure. If your Diastolic (lower  reading)is above 100 mmHg, elective cases will be cancelled/rescheduled. . Blood thinners: These will need to be stopped for procedures. Notify our staff if you are taking any blood thinners. Depending on which one you take, there will be specific instructions on how and when to stop it. . Diabetics on insulin: Notify the staff so that you can be scheduled 1st case in the morning. If your diabetes requires high dose insulin, take only  of your normal insulin dose the morning of the procedure and notify the staff that you have done so. . Preventing infections: Shower with an antibacterial soap the morning of your procedure. . Build-up your immune system: Take 1000 mg of Vitamin C with every meal (3 times a day) the day prior to your procedure. Marland Kitchen Antibiotics: Inform the staff if you have a condition or reason that requires you to take antibiotics before dental procedures. . Pregnancy: If you are pregnant, call and cancel the procedure. . Sickness: If you have a cold, fever, or any active infections, call and cancel the procedure. . Arrival: You must be in the facility at least 30 minutes prior to your scheduled procedure. . Children: Do not bring children with you. . Dress appropriately: Bring dark clothing that you would not mind if they get stained. . Valuables: Do not bring any jewelry or valuables.  Reasons to call and reschedule or cancel your procedure: (Following these recommendations will minimize the risk of a serious complication.) . Surgeries: Avoid having procedures within 2 weeks of any surgery. (Avoid for 2 weeks before or after any surgery). . Flu Shots: Avoid having procedures within 2 weeks of a flu shots or . (Avoid for 2 weeks before or after immunizations). . Barium: Avoid having a procedure within 7-10 days after having had a radiological  study involving the use of radiological contrast. (Myelograms, Barium swallow or enema study). . Heart attacks: Avoid any elective procedures  or surgeries for the initial 6 months after a "Myocardial Infarction" (Heart Attack). . Blood thinners: It is imperative that you stop these medications before procedures. Let us know if you if you take any blood thinner.  . Infection: Avoid procedures during or within two weeks of an infection (including chest colds or gastrointestinal problems). Symptoms associated with infections include: Localized redness, fever, chills, night sweats or profuse sweating, burning sensation when voiding, cough, congestion, stuffiness, runny nose, sore throat, diarrhea, nausea, vomiting, cold or Flu symptoms, recent or current infections. It is specially important if the infection is over the area that we intend to treat. Marland Kitchen Heart and lung problems: Symptoms that may suggest an active cardiopulmonary problem include: cough, chest pain, breathing difficulties or shortness of breath, dizziness, ankle swelling, uncontrolled high or unusually low blood pressure, and/or palpitations. If you are experiencing any of these symptoms, cancel your procedure and contact your primary care physician for an evaluation.  Remember:  Regular Business hours are:  Monday to Thursday 8:00 AM to 4:00 PM  Provider's Schedule: Milinda Pointer, MD:  Procedure days: Tuesday and Thursday 7:30 AM to 4:00 PM  Gillis Santa, MD:  Procedure days: Monday and Wednesday 7:30 AM to 4:00 PM ____________________________________________________________________________________________ Three prescriptions for Hydrocodone, and one each for Baclofen and Meloxicam has been sent to your pharmacy.  Please get your x-rays done as soon as possible.

## 2020-02-11 NOTE — Progress Notes (Signed)
Nursing Pain Medication Assessment:  Safety precautions to be maintained throughout the outpatient stay will include: orient to surroundings, keep bed in low position, maintain call bell within reach at all times, provide assistance with transfer out of bed and ambulation.  Medication Inspection Compliance: Ms. Friel did not comply with our request to bring her pills to be counted. She was reminded that bringing the medication bottles, even when empty, is a requirement.  Medication: None brought in. Pill/Patch Count: None available to be counted. Bottle Appearance: No container available. Did not bring bottle(s) to appointment. Filled Date: N/A Last Medication intake:  Today

## 2020-02-14 LAB — TOXASSURE SELECT 13 (MW), URINE

## 2020-02-17 ENCOUNTER — Telehealth: Payer: Self-pay | Admitting: *Deleted

## 2020-02-20 ENCOUNTER — Ambulatory Visit
Admission: RE | Admit: 2020-02-20 | Discharge: 2020-02-20 | Disposition: A | Discharge status: Home / Self Care | Payer: Managed Care, Other (non HMO) | Source: Ambulatory Visit | Attending: Pain Medicine | Admitting: Pain Medicine

## 2020-02-20 ENCOUNTER — Other Ambulatory Visit: Payer: Self-pay

## 2020-02-20 ENCOUNTER — Encounter: Payer: Self-pay | Admitting: Pain Medicine

## 2020-02-20 ENCOUNTER — Ambulatory Visit (HOSPITAL_BASED_OUTPATIENT_CLINIC_OR_DEPARTMENT_OTHER): Payer: Managed Care, Other (non HMO) | Admitting: Pain Medicine

## 2020-02-20 VITALS — BP 132/79 | HR 88 | Temp 97.1°F | Resp 16 | Ht 64.0 in | Wt 172.0 lb

## 2020-02-20 DIAGNOSIS — G8929 Other chronic pain: Secondary | ICD-10-CM

## 2020-02-20 DIAGNOSIS — M7061 Trochanteric bursitis, right hip: Secondary | ICD-10-CM | POA: Diagnosis present

## 2020-02-20 DIAGNOSIS — M1611 Unilateral primary osteoarthritis, right hip: Secondary | ICD-10-CM

## 2020-02-20 DIAGNOSIS — M7062 Trochanteric bursitis, left hip: Secondary | ICD-10-CM | POA: Diagnosis present

## 2020-02-20 DIAGNOSIS — M25551 Pain in right hip: Secondary | ICD-10-CM | POA: Insufficient documentation

## 2020-02-20 MED ORDER — METHYLPREDNISOLONE ACETATE 80 MG/ML IJ SUSP
80.0000 mg | Freq: Once | INTRAMUSCULAR | Status: AC
  Administered 2020-02-20: 80 mg via INTRA_ARTICULAR
  Filled 2020-02-20: qty 1

## 2020-02-20 MED ORDER — FENTANYL CITRATE (PF) 100 MCG/2ML IJ SOLN
25.0000 ug | INTRAMUSCULAR | Status: DC | PRN
  Administered 2020-02-20: 50 ug via INTRAVENOUS
  Filled 2020-02-20: qty 2

## 2020-02-20 MED ORDER — LIDOCAINE HCL 2 % IJ SOLN
20.0000 mL | Freq: Once | INTRAMUSCULAR | Status: AC
  Administered 2020-02-20: 400 mg

## 2020-02-20 MED ORDER — ROPIVACAINE HCL 2 MG/ML IJ SOLN
9.0000 mL | Freq: Once | INTRAMUSCULAR | Status: AC
  Administered 2020-02-20: 9 mL via INTRA_ARTICULAR
  Filled 2020-02-20: qty 10

## 2020-02-20 MED ORDER — LACTATED RINGERS IV SOLN
1000.0000 mL | Freq: Once | INTRAVENOUS | Status: AC
  Administered 2020-02-20: 1000 mL via INTRAVENOUS

## 2020-02-20 MED ORDER — IOHEXOL 180 MG/ML  SOLN
10.0000 mL | Freq: Once | INTRAMUSCULAR | Status: AC
  Administered 2020-02-20: 10 mL via INTRA_ARTICULAR

## 2020-02-20 MED ORDER — MIDAZOLAM HCL 5 MG/5ML IJ SOLN
1.0000 mg | INTRAMUSCULAR | Status: DC | PRN
  Administered 2020-02-20: 3 mg via INTRAVENOUS
  Filled 2020-02-20: qty 5

## 2020-02-20 NOTE — Patient Instructions (Signed)

## 2020-02-20 NOTE — Progress Notes (Signed)
PROVIDER NOTE: Information contained herein reflects review and annotations entered in association with encounter. Interpretation of such information and data should be left to medically-trained personnel. Information provided to patient can be located elsewhere in the medical record under "Patient Instructions". Document created using STT-dictation technology, any transcriptional errors that may result from process are unintentional.    Patient: Denise Burns  Service Category: Procedure  Provider: [name], MD  DOB: 01-06-1971  DOS: 02/20/2020  Location: [facility]  MRN: [id number]  Setting: Ambulatory - outpatient  Referring Provider: Milinda Pointer, MD  Type: Established Patient  Specialty: Interventional Pain Management  PCP: Elby Beck, FNP   Primary Reason for Visit: Interventional Pain Management Treatment. CC: Hip Pain (right)  Procedure #1:  Anesthesia, Analgesia, Anxiolysis:  Type: Intra-Articular Hip Injection #1  Primary Purpose: Diagnostic Region: Posterolateral hip joint area. Level: Lower pelvic and hip joint level. Target Area: Superior aspect of the hip joint cavity, going thru the superior portion of the capsular ligament. Approach: Posterolateral approach. Laterality: Right  Type: Moderate (Conscious) Sedation combined with Local Anesthesia Indication(s): Analgesia and Anxiety Route: Intravenous (IV) IV Access: Secured Sedation: Meaningful verbal contact was maintained at all times during the procedure  Local Anesthetic: Lidocaine 1-2%  Position: Lateral Decubitus with bad side up Area Prepped: Entire Posterolateral hip area. DuraPrep (Iodine Povacrylex [0.7% available iodine] and Isopropyl Alcohol, 74% w/w)   Procedure #2:    Type: Trochanteric Bursa Injection #1  Primary Purpose: Diagnostic Region: Upper (proximal) Femoral Region Level: Hip Joint Target Area: Superior aspect of the hip joint cavity, going thru the superior  portion of the capsular ligament. Approach: Lateral approach Laterality: Right     Indications: 1. Chronic hip pain (Right)   2. Osteoarthritis of hip (Right)   3. Greater trochanteric bursitis (Bilateral)    Pain Score: Pre-procedure: 3 /10 Post-procedure: 0-No pain/10   Pre-op Assessment:  Denise Burns is a 49 y.o. (year old), female patient, seen today for interventional treatment. She  has a past surgical history that includes Cholecystectomy and Polypectomy. Denise Burns has a current medication list which includes the following prescription(s): baclofen, cetirizine, fludrocortisone, hydrocodone-acetaminophen, [START ON 03/12/2020] hydrocodone-acetaminophen, [START ON 04/11/2020] hydrocodone-acetaminophen, hydrocortisone, meloxicam, and pseudoephedrine, and the following Facility-Administered Medications: fentanyl and midazolam. Her primarily concern today is the Hip Pain (right)  Initial Vital Signs:  Pulse/HCG Rate: 88ECG Heart Rate: 80 Temp: (!) 97.3 F (36.3 C) Resp: 17 BP: 122/79 SpO2: 100 %  BMI: Estimated body mass index is 29.52 kg/m as calculated from the following:   Height as of this encounter: 5\' 4"  (1.626 m).   Weight as of this encounter: 172 lb (78 kg).  Risk Assessment: Allergies: Reviewed. She is allergic to lisinopril, ultram [tramadol], and sulfa antibiotics.  Allergy Precautions: None required Coagulopathies: Reviewed. None identified.  Blood-thinner therapy: None at this time Active Infection(s): Reviewed. None identified. Denise Burns is afebrile  Site Confirmation: Denise Burns was asked to confirm the procedure and laterality before marking the site Procedure checklist: Completed Consent: Before the procedure and under the influence of no sedative(s), amnesic(s), or anxiolytics, the patient was informed of the treatment options, risks and possible complications. To fulfill our ethical and legal obligations, as recommended by the American Medical  Association's Code of Ethics, I have informed the patient of my clinical impression; the nature and purpose of the treatment or procedure; the risks, benefits, and possible complications of the intervention; the alternatives, including doing nothing; the risk(s) and benefit(s)  of the alternative treatment(s) or procedure(s); and the risk(s) and benefit(s) of doing nothing. The patient was provided information about the general risks and possible complications associated with the procedure. These may include, but are not limited to: failure to achieve desired goals, infection, bleeding, organ or nerve damage, allergic reactions, paralysis, and death. In addition, the patient was informed of those risks and complications associated to the procedure, such as failure to decrease pain; infection; bleeding; organ or nerve damage with subsequent damage to sensory, motor, and/or autonomic systems, resulting in permanent pain, numbness, and/or weakness of one or several areas of the body; allergic reactions; (i.e.: anaphylactic reaction); and/or death. Furthermore, the patient was informed of those risks and complications associated with the medications. These include, but are not limited to: allergic reactions (i.e.: anaphylactic or anaphylactoid reaction(s)); adrenal axis suppression; blood sugar elevation that in diabetics may result in ketoacidosis or comma; water retention that in patients with history of congestive heart failure may result in shortness of breath, pulmonary edema, and decompensation with resultant heart failure; weight gain; swelling or edema; medication-induced neural toxicity; particulate matter embolism and blood vessel occlusion with resultant organ, and/or nervous system infarction; and/or aseptic necrosis of one or more joints. Finally, the patient was informed that Medicine is not an exact science; therefore, there is also the possibility of unforeseen or unpredictable risks and/or possible  complications that may result in a catastrophic outcome. The patient indicated having understood very clearly. We have given the patient no guarantees and we have made no promises. Enough time was given to the patient to ask questions, all of which were answered to the patient's satisfaction. Denise Burns has indicated that she wanted to continue with the procedure. Attestation: I, the ordering provider, attest that I have discussed with the patient the benefits, risks, side-effects, alternatives, likelihood of achieving goals, and potential problems during recovery for the procedure that I have provided informed consent. Date  Time: 02/20/2020 10:14 AM  Pre-Procedure Preparation:  Monitoring: As per clinic protocol. Respiration, ETCO2, SpO2, BP, heart rate and rhythm monitor placed and checked for adequate function Safety Precautions: Patient was assessed for positional comfort and pressure points before starting the procedure. Time-out: I initiated and conducted the "Time-out" before starting the procedure, as per protocol. The patient was asked to participate by confirming the accuracy of the "Time Out" information. Verification of the correct person, site, and procedure were performed and confirmed by me, the nursing staff, and the patient. "Time-out" conducted as per Joint Commission's Universal Protocol (UP.01.01.01). Time: 1047  Description of Procedure #1:  Safety Precautions: Aspiration looking for blood return was conducted prior to all injections. At no point did we inject any substances, as a needle was being advanced. No attempts were made at seeking any paresthesias. Safe injection practices and needle disposal techniques used. Medications properly checked for expiration dates. SDV (single dose vial) medications used. Description of the Procedure: Protocol guidelines were followed. The patient was placed in position over the fluoroscopy table. The target area was identified and the area  prepped in the usual manner. Skin & deeper tissues infiltrated with local anesthetic. Appropriate amount of time allowed to pass for local anesthetics to take effect. The procedure needles were then advanced to the target area. Proper needle placement secured. Negative aspiration confirmed. Solution injected in intermittent fashion, asking for systemic symptoms every 0.5cc of injectate. The needles were then removed and the area cleansed, making sure to leave some of the prepping solution back to  take advantage of its long term bactericidal properties.  Start Time: 1047 hrs. Materials:  Needle(s) Type: Spinal Needle Gauge: 22G Length: 7-in Medication(s): Please see orders for medications and dosing details.  Imaging Guidance (Non-Spinal):          Type of Imaging Technique: Fluoroscopy Guidance (Non-Spinal) Indication(s): Assistance in needle guidance and placement for procedures requiring needle placement in or near specific anatomical locations not easily accessible without such assistance. Exposure Time: Please see nurses notes. Contrast: Before injecting any contrast, we confirmed that the patient did not have an allergy to iodine, shellfish, or radiological contrast. Once satisfactory needle placement was completed at the desired level, radiological contrast was injected. Contrast injected under live fluoroscopy. No contrast complications. See chart for type and volume of contrast used. Fluoroscopic Guidance: I was personally present during the use of fluoroscopy. "Tunnel Vision Technique" used to obtain the best possible view of the target area. Parallax error corrected before commencing the procedure. "Direction-depth-direction" technique used to introduce the needle under continuous pulsed fluoroscopy. Once target was reached, antero-posterior, oblique, and lateral fluoroscopic projection used confirm needle placement in all planes. Images permanently stored in EMR. Interpretation: I  personally interpreted the imaging intraoperatively. Adequate needle placement confirmed in multiple planes. Appropriate spread of contrast into desired area was observed. No evidence of afferent or efferent intravascular uptake. Permanent images saved into the patient's record.  Description of Procedure #2:  Description of the Procedure: Skin & deeper tissues infiltrated with local anesthetic. Appropriate amount of time allowed to pass for local anesthetics to take effect. The procedure needles were then advanced to the target area. Proper needle placement secured. Negative aspiration confirmed. Solution injected in intermittent fashion, asking for systemic symptoms every 0.5cc of injectate. The needles were then removed and the area cleansed, making sure to leave some of the prepping solution back to take advantage of its long term bactericidal properties.  Vitals:   02/20/20 1050 02/20/20 1100 02/20/20 1110 02/20/20 1120  BP: 110/65 (!) 129/78 (!) 130/76 (!) 132/79  Pulse:      Resp: 18 16 14 16   Temp:  (!) 97.1 F (36.2 C)  (!) 97.1 F (36.2 C)  TempSrc:  Temporal    SpO2: 98% 97% 98% 100%  Weight:      Height:         End Time: 1050 hrs.     Materials:  Needle(s) Type: Spinal Needle Gauge: 22G Length: 7-in Medication(s): Please see orders for medications and dosing details.  Imaging Guidance (Non-Spinal):          Type of Imaging Technique: Fluoroscopy Guidance (Non-Spinal) Indication(s): Assistance in needle guidance and placement for procedures requiring needle placement in or near specific anatomical locations not easily accessible without such assistance. Exposure Time: Please see nurses notes. Contrast: Before injecting any contrast, we confirmed that the patient did not have an allergy to iodine, shellfish, or radiological contrast. Once satisfactory needle placement was completed at the desired level, radiological contrast was injected. Contrast injected under live  fluoroscopy. No contrast complications. See chart for type and volume of contrast used. Fluoroscopic Guidance: I was personally present during the use of fluoroscopy. "Tunnel Vision Technique" used to obtain the best possible view of the target area. Parallax error corrected before commencing the procedure. "Direction-depth-direction" technique used to introduce the needle under continuous pulsed fluoroscopy. Once target was reached, antero-posterior, oblique, and lateral fluoroscopic projection used confirm needle placement in all planes. Images permanently stored in EMR. Interpretation: I personally  interpreted the imaging intraoperatively. Adequate needle placement confirmed in multiple planes. Appropriate spread of contrast into desired area was observed. No evidence of afferent or efferent intravascular uptake. Permanent images saved into the patient's record.  Antibiotic Prophylaxis:   Anti-infectives (From admission, onward)   None     Indication(s): None identified  Post-operative Assessment:  Post-procedure Vital Signs:  Pulse/HCG Rate: 8873 Temp: (!) 97.1 F (36.2 C) Resp: 16 BP: (!) 132/79 SpO2: 100 %  EBL: None  Complications: No immediate post-treatment complications observed by team, or reported by patient.  Note: The patient tolerated the entire procedure well. A repeat set of vitals were taken after the procedure and the patient was kept under observation following institutional policy, for this type of procedure. Post-procedural neurological assessment was performed, showing return to baseline, prior to discharge. The patient was provided with post-procedure discharge instructions, including a section on how to identify potential problems. Should any problems arise concerning this procedure, the patient was given instructions to immediately contact us, at any time, without hesitation. In any case, we plan to contact the patient by telephone for a follow-up status report  regarding this interventional procedure.  Comments:  No additional relevant information.  Plan of Care  Orders:  Orders Placed This Encounter  Procedures  . HIP INJECTION    Scheduling Instructions:     Side: Right-sided     Sedation: With Sedation.     Timeframe: Today  . [facility] C-ARM 1-60 MIN NO REPORT    Intraoperative interpretation by procedural physician at [place].    Standing Status:   Standing    Number of Occurrences:   1    Order Specific Question:   Reason for exam:    Answer:   Assistance in needle guidance and placement for procedures requiring needle placement in or near specific anatomical locations not easily accessible without such assistance.  . Informed Consent Details: Physician/Practitioner Attestation; Transcribe to consent form and obtain patient signature    Nursing Order: Transcribe to consent form and obtain patient signature. Note: Always confirm laterality of pain with Denise Burns, before procedure. Procedure: Hip injection Indication/Reason: Hip Joint Pain (Arthralgia) Provider Attestation: I, Henriette Dossie Arbour, MD, (Pain Management Specialist), the physician/practitioner, attest that I have discussed with the patient the benefits, risks, side effects, alternatives, likelihood of achieving goals and potential problems during recovery for the procedure that I have provided informed consent.  . Provide equipment / supplies at bedside    Equipment required: Single use, disposable, "Block Tray"    Standing Status:   Standing    Number of Occurrences:   1    Order Specific Question:   Specify    Answer:   Block Tray   Chronic Opioid Analgesic:  Hydrocodone/APAP 7.5/325 1 tablet p.o. 3 times daily (22.5 mg/day of hydrocodone)  MME/day:22.5mg /day.   Medications ordered for procedure: Meds ordered this encounter  Medications  . iohexol (OMNIPAQUE) 180 MG/ML injection 10 mL    Must be Myelogram-compatible. If not available, you  may substitute with a water-soluble, non-ionic, hypoallergenic, myelogram-compatible radiological contrast medium.  Marland Kitchen lidocaine (XYLOCAINE) 2 % (with pres) injection 400 mg  . lactated ringers infusion 1,000 mL  . midazolam (VERSED) 5 MG/5ML injection 1-2 mg    Make sure Flumazenil is available in the pyxis when using this medication. If oversedation occurs, administer 0.2 mg IV over 15 sec. If after 45 sec no response, administer 0.2 mg again over 1 min; may repeat  at 1 min intervals; not to exceed 4 doses (1 mg)  . fentaNYL (SUBLIMAZE) injection 25-50 mcg    Make sure Narcan is available in the pyxis when using this medication. In the event of respiratory depression (RR< 8/min): Titrate NARCAN (naloxone) in increments of 0.1 to 0.2 mg IV at 2-3 minute intervals, until desired degree of reversal.  . methylPREDNISolone acetate (DEPO-MEDROL) injection 80 mg  . ropivacaine (PF) 2 mg/mL (0.2%) (NAROPIN) injection 9 mL   Medications administered: We administered iohexol, lidocaine, lactated ringers, midazolam, fentaNYL, methylPREDNISolone acetate, and ropivacaine (PF) 2 mg/mL (0.2%).  See the medical record for exact dosing, route, and time of administration.  Follow-up plan:   Return in about 2 weeks (around 03/05/2020) for 20-min, F2F, PP-(on eval day).       Considering: Therapeutic bilateral lumbar facetRFA #1  Schoeneck joint Blk PossiblebilateralSI joint RFA Diagnostic bilateral L5 TFESI Diagnostic left-sided L2 TFESI   PRN Procedures: Palliative bilateral lumbar facet block #3 Palliative right lumbar facet RFA #2 (last done 01/07/2020) Palliative left lumbar facet RFA #2 (last done 02/04/2020)    Recent Visits Date Type Provider Dept  02/11/20 Office Visit Milinda Pointer, MD [facility]  02/04/20 Procedure visit Milinda Pointer, MD [facility]  01/07/20 Procedure visit Milinda Pointer, MD [facility]  12/12/19  Telemedicine Milinda Pointer, MD [facility]  Showing recent visits within past 90 days and meeting all other requirements Today's Visits Date Type Provider Dept  02/20/20 Procedure visit Milinda Pointer, MD [facility]  Showing today's visits and meeting all other requirements Future Appointments Date Type Provider Dept  03/17/20 Appointment Milinda Pointer, MD [facility]  Showing future appointments within next 90 days and meeting all other requirements  Disposition: Discharge home  Discharge (Date  Time): 02/20/2020; 1125 hrs.   Primary Care Physician: Elby Beck, FNP Location: [facility] Note by: [name], MD Date: 02/20/2020; Time: 12:24 PM  Disclaimer:  Medicine is not an Chief Strategy Officer. The only guarantee in medicine is that nothing is guaranteed. It is important to note that the decision to proceed with this intervention was based on the information collected from the patient. The Data and conclusions were drawn from the patient's questionnaire, the interview, and the physical examination. Because the information was provided in large part by the patient, it cannot be guaranteed that it has not been purposely or unconsciously manipulated. Every effort has been made to obtain as much relevant data as possible for this evaluation. It is important to note that the conclusions that lead to this procedure are derived in large part from the available data. Always take into account that the treatment will also be dependent on availability of resources and existing treatment guidelines, considered by other Pain Management Practitioners as being common knowledge and practice, at the time of the intervention. For Medico-Legal purposes, it is also important to point out that variation in procedural techniques and pharmacological choices are the acceptable norm. The indications, contraindications, technique, and  results of the above procedure should only be interpreted and judged by a Board-Certified Interventional Pain Specialist with extensive familiarity and expertise in the same exact procedure and technique.

## 2020-02-21 ENCOUNTER — Telehealth: Payer: Self-pay | Admitting: *Deleted

## 2020-02-21 NOTE — Telephone Encounter (Signed)
Attempted to call for post procedure follow-up. Message left. 

## 2020-02-24 ENCOUNTER — Other Ambulatory Visit: Payer: Self-pay | Admitting: Internal Medicine

## 2020-03-16 ENCOUNTER — Encounter: Payer: Self-pay | Admitting: Pain Medicine

## 2020-03-16 ENCOUNTER — Telehealth: Payer: Self-pay

## 2020-03-16 ENCOUNTER — Telehealth: Payer: Self-pay | Admitting: *Deleted

## 2020-03-16 NOTE — Telephone Encounter (Signed)
Left message for patient to call the office today before 4pm so that we may go over the pre virtual visit questions.  Informed patient that the MD asks that we reschedule if we do not get the questions done by the end of the workday today.

## 2020-03-17 ENCOUNTER — Ambulatory Visit: Payer: Managed Care, Other (non HMO) | Attending: Pain Medicine | Admitting: Pain Medicine

## 2020-03-17 ENCOUNTER — Other Ambulatory Visit: Payer: Self-pay

## 2020-03-17 DIAGNOSIS — M7061 Trochanteric bursitis, right hip: Secondary | ICD-10-CM

## 2020-03-17 DIAGNOSIS — M25551 Pain in right hip: Secondary | ICD-10-CM

## 2020-03-17 NOTE — Progress Notes (Signed)
Patient: Denise Burns  Service Category: E/M  Provider: [name], MD  DOB: September 20, 1970  DOS: 03/17/2020  Location: Office  MRN: [id number]  Setting: Ambulatory outpatient  Referring Provider: Elby Beck, FNP  Type: Established Patient  Specialty: Interventional Pain Management  PCP: Elby Beck, FNP  Location: Remote location  Delivery: TeleHealth     Virtual Encounter - Pain Management PROVIDER NOTE: Information contained herein reflects review and annotations entered in association with encounter. Interpretation of such information and data should be left to medically-trained personnel. Information provided to patient can be located elsewhere in the medical record under "Patient Instructions". Document created using STT-dictation technology, any transcriptional errors that may result from process are unintentional.    Contact & Pharmacy Preferred: [phone] Home: [phone] (home) Mobile: [phone] (mobile) E-mail: [email or web address]  [address], [place] [address] Phone: [phone] Fax: [phone]   Pre-screening  [name] offered "in-person" vs "virtual" encounter. She indicated preferring virtual for this encounter.   Reason COVID-19*  Social distancing based on CDC and AMA recommendations.   I contacted Denise Burns on 03/17/2020 via telephone.      I clearly identified myself as [name], MD. I verified that I was speaking with the correct person using two identifiers (Name: Denise Burns, and date of birth: [birth year]).  Consent I sought verbal advanced consent from Denise Burns for virtual visit interactions. I informed Denise Burns of possible security and privacy concerns, risks, and limitations associated with providing "not-in-person" medical evaluation and management services. I also informed Denise Burns of the availability of "in-person"  appointments. Finally, I informed her that there would be a charge for the virtual visit and that she could be  personally, fully or partially, financially responsible for it. Denise Burns expressed understanding and agreed to proceed.   Historic Elements   Denise Burns is a 49 y.o. year old, female patient evaluated today after her last contact with our practice on 03/16/2020. Denise Burns  has a past medical history of Addison disease ([place]), Arthritis, Back pain, and GERD (gastroesophageal reflux disease). She also  has a past surgical history that includes Cholecystectomy and Polypectomy. Denise Burns has a current medication list which includes the following prescription(s): baclofen, cetirizine, fludrocortisone, hydrocodone-acetaminophen, [START ON 04/11/2020] hydrocodone-acetaminophen, hydrocortisone, meloxicam, pseudoephedrine, and hydrocodone-acetaminophen. She  reports that she has quit smoking. She has never used smokeless tobacco. She reports current alcohol use. She reports that she does not use drugs. Denise Burns is allergic to lisinopril, ultram [tramadol], and sulfa antibiotics.   HPI  Today, she is being contacted for a post-procedure assessment.  The patient indicates having done rather well with the diagnostic injection.  The fact that she attained 100% relief of the pain for the duration of the local anesthetic would suggest that this is the correct etiology for that particular pain.  She indicates currently having about 70% improvement in the pain and this particular portion is likely to be due to to the effects of the steroid suggesting that there is a certain degree of inflammatory process involved in the etiology of the pain.  I recommended to the patient to keep track of those things that typically will cause pain as she is doing them as well as things that may be bothersome but 2 to 3 days later is followed by pain.  Post-Procedure Evaluation  Procedure (02/20/2020): Diagnostic  right IA hip joint injection #  1 + diagnostic right trochanteric bursa injection #1 under fluoroscopic guidance and IV sedation Pre-procedure pain level: 3/10 Post-procedure: 0/10 (100% relief)  Sedation: Sedation provided.  Effectiveness during initial hour after procedure(Ultra-Short Term Relief): 100 %.  Local anesthetic used: Long-acting (4-6 hours) Effectiveness: Defined as any analgesic benefit obtained secondary to the administration of local anesthetics. This carries significant diagnostic value as to the etiological location, or anatomical origin, of the pain. Duration of benefit is expected to coincide with the duration of the local anesthetic used.  Effectiveness during initial 4-6 hours after procedure(Short-Term Relief): 100 %.  Long-term benefit: Defined as any relief past the pharmacologic duration of the local anesthetics.  Effectiveness past the initial 6 hours after procedure(Long-Term Relief): 60 %.  Current benefits: Defined as benefit that persist at this time.   Analgesia:  70% improved Function: Denise Burns reports improvement in function ROM: Denise Burns reports improvement in ROM  Pharmacotherapy Assessment  Analgesic: Hydrocodone/APAP 7.5/325 1 tablet p.o. 3 times daily (22.5 mg/day of hydrocodone)  MME/day:22.66m/day.   Monitoring: [place] PMP: PDMP reviewed during this encounter.       Pharmacotherapy: No side-effects or adverse reactions reported. Compliance: No problems identified. Effectiveness: Clinically acceptable. Plan: Refer to "POC".  UDS:  Summary  Date Value Ref Range Status  02/11/2020 Note  Final    Comment:    ==================================================================== [employer] Select 13 (MW) ==================================================================== Test                             Result       Flag       Units  Drug Present not Declared for Prescription Verification   Alprazolam                     23            UNEXPECTED ng/mg creat   Alpha-hydroxyalprazolam        16           UNEXPECTED ng/mg creat    Source of alprazolam is a scheduled prescription medication. Alpha-    hydroxyalprazolam is an expected metabolite of alprazolam.    Oxycodone                      2136         UNEXPECTED ng/mg creat   Oxymorphone                    148          UNEXPECTED ng/mg creat   Noroxycodone                   5926         UNEXPECTED ng/mg creat   Noroxymorphone                 324          UNEXPECTED ng/mg creat    Sources of oxycodone are scheduled prescription medications.    Oxymorphone, noroxycodone, and noroxymorphone are expected    metabolites of oxycodone. Oxymorphone is also available as a    scheduled prescription medication.    Butalbital                     PRESENT      UNEXPECTED  Drug Absent but Declared for Prescription Verification   Hydrocodone  Not Detected UNEXPECTED ng/mg creat ==================================================================== Test                      Result    Flag   Units      Ref Range   Creatinine              146              mg/dL      >=20 ==================================================================== Declared Medications:  The flagging and interpretation on this report are based on the  following declared medications.  Unexpected results may arise from  inaccuracies in the declared medications.   **Note: The testing scope of this panel includes these medications:   Hydrocodone   **Note: The testing scope of this panel does not include the  following reported medications:   Acetaminophen  Baclofen  Cetirizine  Fludrocortisone  Hydrocortisone  Meloxicam  Pseudoephedrine (Sudafed) ==================================================================== For clinical consultation, please call [phone]. ====================================================================     Laboratory Chemistry Profile   Renal Lab  Results  Component Value Date   BUN 16 08/13/2019   CREATININE 0.84 08/13/2019   BCR 18 08/16/2018   GFR 72.11 08/13/2019   GFRAA 86 08/16/2018   GFRNONAA 74 08/16/2018     Hepatic Lab Results  Component Value Date   AST 18 08/16/2018   ALBUMIN 4.4 08/16/2018   ALKPHOS 64 08/16/2018     Electrolytes Lab Results  Component Value Date   NA 141 08/13/2019   K 3.6 08/13/2019   CL 106 08/13/2019   CALCIUM 8.9 08/13/2019   MG 2.1 08/16/2018     Bone Lab Results  Component Value Date   VD25OH 13.55 (L) 04/30/2018   25OHVITD1 67 08/16/2018   25OHVITD2 <1.0 08/16/2018   25OHVITD3 67 08/16/2018     Inflammation (CRP: Acute Phase) (ESR: Chronic Phase) Lab Results  Component Value Date   CRP 2 08/16/2018   ESRSEDRATE 37 (H) 08/16/2018       Note: Above Lab results reviewed.   Imaging  DG PAIN CLINIC C-ARM 1-60 MIN NO REPORT Fluoro was used, but no Radiologist interpretation will be provided.  Please refer to "NOTES" tab for provider progress note.  Assessment  There were no encounter diagnoses.  Plan of Care  Problem-specific:  No problem-specific Assessment & Plan notes found for this encounter.  Denise Burns has a current medication list which includes the following long-term medication(s): baclofen, cetirizine, hydrocodone-acetaminophen, [START ON 04/11/2020] hydrocodone-acetaminophen, meloxicam, and hydrocodone-acetaminophen.  Pharmacotherapy (Medications Ordered): No orders of the defined types were placed in this encounter.  Orders:  No orders of the defined types were placed in this encounter.  Follow-up plan:   No follow-ups on file.      Considering: Therapeutic bilateral lumbar facetRFA #1  [place] joint Blk PossiblebilateralSI joint RFA Diagnostic bilateral L5 TFESI Diagnostic left-sided L2 TFESI   PRN Procedures: Palliative bilateral lumbar facet block #3 Palliative right lumbar facet RFA #2 (last done  01/07/2020) Palliative left lumbar facet RFA #2 (last done 02/04/2020)     Recent Visits Date Type Provider Dept  02/20/20 Procedure visit Milinda Pointer, [facility]  02/11/20 Office Visit Milinda Pointer, MD [facility]  02/04/20 Procedure visit Milinda Pointer, MD [facility]  01/07/20 Procedure visit Milinda Pointer, MD [facility]  Showing recent visits within past 90 days and meeting all other requirements Today's Visits Date Type Provider Dept  03/17/20 Telemedicine Milinda Pointer, MD [facility]  Showing today's visits and meeting all other requirements Future Appointments No visits were found meeting these conditions. Showing future appointments within next 90 days and meeting all other requirements  I discussed the assessment and treatment plan with the patient. The patient was provided an opportunity to ask questions and all were answered. The patient agreed with the plan and demonstrated an understanding of the instructions.  Patient advised to call back or seek an in-person evaluation if the symptoms or condition worsens.  Duration of encounter: 13 minutes.  Note by: [name], MD Date: 03/17/2020; Time: 9:10 AM

## 2020-05-10 NOTE — Progress Notes (Signed)
PROVIDER NOTE: Information contained herein reflects review and annotations entered in association with encounter. Interpretation of such information and data should be left to medically-trained personnel. Information provided to patient can be located elsewhere in the medical record under "Patient Instructions". Document created using STT-dictation technology, any transcriptional errors that may result from process are unintentional.    Patient: Denise Burns  Service Category: E/M  Provider: [name], MD  DOB: 09/06/1970  DOS: 05/11/2020  Specialty: Interventional Pain Management  MRN: [id number]  Setting: Ambulatory outpatient  PCP: Elby Beck, FNP  Type: Established Patient    Referring Provider: Elby Beck, FNP  Location: Office  Delivery: Face-to-face     HPI  [name], a 49 y.o. year old female, is here today because of her Chronic pain syndrome [G89.4]. [name]'s primary complain today is Back Pain (thoracic to lumbar right side x 3 weeks ) and Leg Pain (right hip and groin area) Last encounter: My last encounter with her was on 02/20/2020. Pertinent problems: [name] has Chronic midline low back pain with bilateral sciatica; Chronic low back pain (Primary Area of Pain) (Bilateral) (R>L) w/o sciatica; Chronic sacroiliac joint pain (Secondary Area of Pain) (Right); Chronic pain syndrome; Abnormal MRI, lumbar spine (03/22/2018); Lumbar facet hypertrophy (Multilevel) (Bilateral); Lumbar facet syndrome (Bilateral); DDD (degenerative disc disease), lumbosacral; Spondylolisthesis (8 mm) at L5-S1 level; Lumbar Grade 1 Retrolisthesis (2 mm) of L2/L3; L5-S1 pars defect w/ spondylolisthesis (Bilateral); Lumbar pars defect (L5-S1) (Bilateral); Lumbar foraminal stenosis (Left: L2-3) (Bilateral: L5-S1); Osteoarthritis of sacroiliac joint (Bilateral); Chronic musculoskeletal pain; Neurogenic pain; Osteoarthritis of facet joint of lumbar spine; Greater trochanteric  bursitis (Bilateral); Lumbar spondylosis; Spondylosis without myelopathy or radiculopathy, lumbosacral region; Acute postoperative pain; Chronic hip pain (Right); Osteoarthritis of hip (Right); Lumbar radiculitis (L1/L2) (Right); and Chronic groin pain (Right) on their pertinent problem list. Pain Assessment: Severity of Chronic pain is reported as a 6 /10. Location: Back Mid, Lower, Right/? radiating into right leg and groin. Onset: More than a month ago. Quality: Constant, Discomfort, Aching. Timing: Constant. Modifying factor(s): medications. Vitals:  height is 5' 4"  (1.626 m) and weight is 180 lb (81.6 kg). Her temporal temperature is 98.4 F (36.9 C). Her blood pressure is 153/84 (abnormal) and her pulse is 96. Her respiration is 16 and oxygen saturation is 98%.   Reason for encounter: medication management.  The patient indicates doing well with the current medication regimen. No adverse reactions or side effects reported to the medications.  The patient indicates that she has been experiencing more pain in the mid back on the right side at around the T12 level.  She also describes having pain in the right groin and the right hip area.  Provocative physical exam today was positive for nerve root pain when doing lateral bending of the lumbar spine but negative on hyperextension and rotation as well as negative on figure 4 maneuver/Patrick maneuver/FABER test.  Review of the patient's 2019 MRI reveals a disc protrusion at the L1-2 level that may be responsible for the patient's symptoms.  In view of this, I will be scheduling her to return for a right-sided L1-2 LESI under fluoroscopic guidance and IV sedation.  The plan was discussed with the patient who understood and accepted.  RTCB: 08/09/2020 Transfer nonopioids: Baclofen (Lioresal) 10 mg tablet 1 tab p.o. daily (30/month) (08/09/2020); meloxicam (Mobic) 15 mg tablet, 1 tab p.o. daily (30/month) (08/09/2020)  Pharmacotherapy Assessment   Analgesic:  Hydrocodone/APAP 7.5/325 1 tablet p.o. 3  times daily (22.5 mg/day of hydrocodone)  MME/day:22.36m/day.   Monitoring: [place] PMP: PDMP reviewed during this encounter.       Pharmacotherapy: No side-effects or adverse reactions reported. Compliance: No problems identified. Effectiveness: Clinically acceptable.  PJanett Billow RN  05/11/2020  3:17 PM  Sign when Signing Visit Nursing Pain Medication Assessment:  Safety precautions to be maintained throughout the outpatient stay will include: orient to surroundings, keep bed in low position, maintain call bell within reach at all times, provide assistance with transfer out of bed and ambulation.  Medication Inspection Compliance: Pill count conducted under aseptic conditions, in front of the patient. Neither the pills nor the bottle was removed from the patient's sight at any time. Once count was completed pills were immediately returned to the patient in their original bottle.  Medication: Hydrocodone/APAP Pill/Patch Count: 0 of 90 pills remain Pill/Patch Appearance: Markings consistent with prescribed medication Bottle Appearance: Standard pharmacy container. Clearly labeled. Filled Date: 09 / 10 / 2021 Last Medication intake:  Today    UDS:  Summary  Date Value Ref Range Status  02/11/2020 Note  Final    Comment:    ==================================================================== [employer] [facility] (MW) ==================================================================== Test                             Result       Flag       Units  Drug Present not Declared for Prescription Verification   Alprazolam                     23           UNEXPECTED ng/mg creat   Alpha-hydroxyalprazolam        16           UNEXPECTED ng/mg creat    Source of alprazolam is a scheduled prescription medication. Alpha-    hydroxyalprazolam is an expected metabolite of alprazolam.    Oxycodone                      2136         UNEXPECTED ng/mg  creat   Oxymorphone                    148          UNEXPECTED ng/mg creat   Noroxycodone                   5926         UNEXPECTED ng/mg creat   Noroxymorphone                 324          UNEXPECTED ng/mg creat    Sources of oxycodone are scheduled prescription medications.    Oxymorphone, noroxycodone, and noroxymorphone are expected    metabolites of oxycodone. Oxymorphone is also available as a    scheduled prescription medication.    Butalbital                     PRESENT      UNEXPECTED  Drug Absent but Declared for Prescription Verification   Hydrocodone                    Not Detected UNEXPECTED ng/mg creat ==================================================================== Test  Result    Flag   Units      Ref Range   Creatinine              146              mg/dL      >=20 ==================================================================== Declared Medications:  The flagging and interpretation on this report are based on the  following declared medications.  Unexpected results may arise from  inaccuracies in the declared medications.   **Note: The testing scope of this panel includes these medications:   Hydrocodone   **Note: The testing scope of this panel does not include the  following reported medications:   Acetaminophen  Baclofen  Cetirizine  Fludrocortisone  Hydrocortisone  Meloxicam  Pseudoephedrine (Sudafed) ==================================================================== For clinical consultation, please call [phone]. ====================================================================      ROS  Constitutional: Denies any fever or chills Gastrointestinal: No reported hemesis, hematochezia, vomiting, or acute GI distress Musculoskeletal: Denies any acute onset joint swelling, redness, loss of ROM, or weakness Neurological: No reported episodes of acute onset apraxia, aphasia, dysarthria, agnosia, amnesia, paralysis,  loss of coordination, or loss of consciousness  Medication Review  HYDROcodone-acetaminophen, baclofen, cetirizine, fludrocortisone, hydrocortisone, meloxicam, and pseudoephedrine  History Review  Allergy: Ms. Hair is allergic to lisinopril, ultram [tramadol], and sulfa antibiotics. Drug: Ms. Gaccione  reports no history of drug use. Alcohol:  reports current alcohol use. Tobacco:  reports that she has quit smoking. She has never used smokeless tobacco. Social: Ms. Delossantos  reports that she has quit smoking. She has never used smokeless tobacco. She reports current alcohol use. She reports that she does not use drugs. Medical:  has a past medical history of Addison disease ([place]), Arthritis, Back pain, and GERD (gastroesophageal reflux disease). Surgical: Ms. Gosa  has a past surgical history that includes Cholecystectomy and Polypectomy. Family: family history includes Alcohol abuse in her father; Arthritis in her mother; Hearing loss in her father; Hyperlipidemia in her father and mother; Hypertension in her mother.  Laboratory Chemistry Profile   Renal Lab Results  Component Value Date   BUN 16 08/13/2019   CREATININE 0.84 08/13/2019   BCR 18 08/16/2018   GFR 72.11 08/13/2019   GFRAA 86 08/16/2018   GFRNONAA 74 08/16/2018     Hepatic Lab Results  Component Value Date   AST 18 08/16/2018   ALBUMIN 4.4 08/16/2018   ALKPHOS 64 08/16/2018     Electrolytes Lab Results  Component Value Date   NA 141 08/13/2019   K 3.6 08/13/2019   CL 106 08/13/2019   CALCIUM 8.9 08/13/2019   MG 2.1 08/16/2018     Bone Lab Results  Component Value Date   VD25OH 13.55 (L) 04/30/2018   25OHVITD1 67 08/16/2018   25OHVITD2 <1.0 08/16/2018   25OHVITD3 67 08/16/2018     Inflammation (CRP: Acute Phase) (ESR: Chronic Phase) Lab Results  Component Value Date   CRP 2 08/16/2018   ESRSEDRATE 37 (H) 08/16/2018       Note: Above Lab results reviewed.  Recent Imaging Review  DG  PAIN CLINIC C-ARM 1-60 MIN NO REPORT Fluoro was used, but no Radiologist interpretation will be provided.  Please refer to "NOTES" tab for provider progress note. Note: Reviewed        Physical Exam  General appearance: Well nourished, well developed, and well hydrated. In no apparent acute distress Mental status: Alert, oriented x 3 (person, place, & time)       Respiratory: No evidence  of acute respiratory distress Eyes: PERLA Vitals: BP (!) 153/84 (BP Location: Right Arm, Patient Position: Sitting, Cuff Size: Normal)   Pulse 96   Temp 98.4 F (36.9 C) (Temporal)   Resp 16   Ht 5' 4"  (1.626 m)   Wt 180 lb (81.6 kg)   LMP 06/08/2018 (Exact Date)   SpO2 98%   BMI 30.90 kg/m  BMI: Estimated body mass index is 30.9 kg/m as calculated from the following:   Height as of this encounter: 5' 4"  (1.626 m).   Weight as of this encounter: 180 lb (81.6 kg). Ideal: Ideal body weight: 54.7 kg (120 lb 9.5 oz) Adjusted ideal body weight: 65.5 kg (144 lb 5.7 oz)  Assessment   Status Diagnosis  Controlled Worsening Worsening 1. Chronic pain syndrome   2. Lumbar radiculitis (L1/L2) (Right)   3. Chronic groin pain (Right)   4. Chronic low back pain (Primary Area of Pain) (Bilateral) (R>L) w/o sciatica   5. Chronic hip pain (Right)   6. Lumbar foraminal stenosis (Left: L2-3) (Bilateral: L5-S1)   7. Lumbar Grade 1 Retrolisthesis (2 mm) of L2/L3   8. Abnormal MRI, lumbar spine (03/22/2018)   9. Pharmacologic therapy      Updated Problems: Problem  Lumbar radiculitis (L1/L2) (Right)  Chronic groin pain (Right)    Plan of Care  Problem-specific:  No problem-specific Assessment & Plan notes found for this encounter.  Ms. Britania Shreeve has a current medication list which includes the following long-term medication(s): baclofen, cetirizine, hydrocodone-acetaminophen, [START ON 06/10/2020] hydrocodone-acetaminophen, [START ON 07/10/2020] hydrocodone-acetaminophen, and  meloxicam.  Pharmacotherapy (Medications Ordered): Meds ordered this encounter  Medications  . HYDROcodone-acetaminophen (NORCO) 7.5-325 MG tablet    Sig: Take 1 tablet by mouth every 8 (eight) hours as needed for severe pain. Must last 30 days.    Dispense:  90 tablet    Refill:  0    Chronic Pain: STOP Act (Not applicable) Fill 1 day early if closed on refill date. Avoid benzodiazepines within 8 hours of opioids  . HYDROcodone-acetaminophen (NORCO) 7.5-325 MG tablet    Sig: Take 1 tablet by mouth every 8 (eight) hours as needed for severe pain. Must last 30 days.    Dispense:  90 tablet    Refill:  0    Chronic Pain: STOP Act (Not applicable) Fill 1 day early if closed on refill date. Avoid benzodiazepines within 8 hours of opioids  . HYDROcodone-acetaminophen (NORCO) 7.5-325 MG tablet    Sig: Take 1 tablet by mouth every 8 (eight) hours as needed for severe pain. Must last 30 days.    Dispense:  90 tablet    Refill:  0    Chronic Pain: STOP Act (Not applicable) Fill 1 day early if closed on refill date. Avoid benzodiazepines within 8 hours of opioids   Orders:  Orders Placed This Encounter  Procedures  . Lumbar Epidural Injection    Standing Status:   Future    Standing Expiration Date:   06/11/2020    Scheduling Instructions:     Procedure: Interlaminar Lumbar Epidural Steroid injection (LESI)  L1-2     Laterality: Right-sided     Sedation: Patient's choice.     Timeframe: ASAA    Order Specific Question:   Where will this procedure be performed?    Answer:   [facility]   Follow-up plan:   Return for Procedure (w/ sedation): (R) L1-2 LESI .      Considering: Therapeutic bilateral lumbar facetRFA #1  [place] joint Blk PossiblebilateralSI joint RFA Diagnostic bilateral L5 TFESI Diagnostic left-sided L2 TFESI   PRN Procedures: Palliative bilateral lumbar facet block #3 Palliative right lumbar facet RFA #2 (last done  01/07/2020) Palliative left lumbar facet RFA #2 (last done 02/04/2020)      Recent Visits Date Type Provider Dept  03/17/20 Telemedicine Milinda Pointer, MD [facility]  02/20/20 Procedure visit Milinda Pointer, MD [facility]  02/11/20 Office Visit Milinda Pointer, MD [facility]  Showing recent visits within past 90 days and meeting all other requirements Today's Visits Date Type Provider Dept  05/11/20 Office Visit Milinda Pointer, MD [facility]  Showing today's visits and meeting all other requirements Future Appointments Date Type Provider Dept  08/03/20 Appointment Milinda Pointer, MD [facility]  Showing future appointments within next 90 days and meeting all other requirements  I discussed the assessment and treatment plan with the patient. The patient was provided an opportunity to ask questions and all were answered. The patient agreed with the plan and demonstrated an understanding of the instructions.  Patient advised to call back or seek an in-person evaluation if the symptoms or condition worsens.  Duration of encounter: 30 minutes.  Note by: [name], MD Date: 05/11/2020; Time: 4:10 PM

## 2020-05-11 ENCOUNTER — Ambulatory Visit: Payer: Managed Care, Other (non HMO) | Attending: Pain Medicine | Admitting: Pain Medicine

## 2020-05-11 ENCOUNTER — Encounter: Payer: Self-pay | Admitting: Pain Medicine

## 2020-05-11 ENCOUNTER — Other Ambulatory Visit: Payer: Self-pay | Admitting: Pain Medicine

## 2020-05-11 ENCOUNTER — Other Ambulatory Visit: Payer: Self-pay

## 2020-05-11 VITALS — BP 153/84 | HR 96 | Temp 98.4°F | Resp 16 | Ht 64.0 in | Wt 180.0 lb

## 2020-05-11 DIAGNOSIS — R1031 Right lower quadrant pain: Secondary | ICD-10-CM | POA: Diagnosis not present

## 2020-05-11 DIAGNOSIS — G894 Chronic pain syndrome: Secondary | ICD-10-CM | POA: Insufficient documentation

## 2020-05-11 DIAGNOSIS — M25551 Pain in right hip: Secondary | ICD-10-CM

## 2020-05-11 DIAGNOSIS — Z79899 Other long term (current) drug therapy: Secondary | ICD-10-CM | POA: Diagnosis present

## 2020-05-11 DIAGNOSIS — R937 Abnormal findings on diagnostic imaging of other parts of musculoskeletal system: Secondary | ICD-10-CM

## 2020-05-11 DIAGNOSIS — G8929 Other chronic pain: Secondary | ICD-10-CM | POA: Diagnosis present

## 2020-05-11 DIAGNOSIS — M48061 Spinal stenosis, lumbar region without neurogenic claudication: Secondary | ICD-10-CM | POA: Diagnosis present

## 2020-05-11 DIAGNOSIS — M4310 Spondylolisthesis, site unspecified: Secondary | ICD-10-CM | POA: Insufficient documentation

## 2020-05-11 DIAGNOSIS — M545 Low back pain, unspecified: Secondary | ICD-10-CM | POA: Insufficient documentation

## 2020-05-11 DIAGNOSIS — M5416 Radiculopathy, lumbar region: Secondary | ICD-10-CM | POA: Insufficient documentation

## 2020-05-11 MED ORDER — HYDROCODONE-ACETAMINOPHEN 7.5-325 MG PO TABS: 1.0000 | ORAL_TABLET | Freq: Three times a day (TID) | ORAL | 0 refills | Status: DC | PRN

## 2020-05-11 NOTE — Progress Notes (Signed)
Nursing Pain Medication Assessment:  Safety precautions to be maintained throughout the outpatient stay will include: orient to surroundings, keep bed in low position, maintain call bell within reach at all times, provide assistance with transfer out of bed and ambulation.  Medication Inspection Compliance: Pill count conducted under aseptic conditions, in front of the patient. Neither the pills nor the bottle was removed from the patient's sight at any time. Once count was completed pills were immediately returned to the patient in their original bottle.  Medication: Hydrocodone/APAP Pill/Patch Count: 0 of 90 pills remain Pill/Patch Appearance: Markings consistent with prescribed medication Bottle Appearance: Standard pharmacy container. Clearly labeled. Filled Date: 09 / 10 / 2021 Last Medication intake:  Today

## 2020-05-11 NOTE — Patient Instructions (Signed)
____________________________________________________________________________________________  Preparing for Procedure with Sedation  Procedure appointments are limited to planned procedures: . No Prescription Refills. . No disability issues will be discussed. . No medication changes will be discussed.  Instructions: . Oral Intake: Do not eat or drink anything for at least 8 hours prior to your procedure. (Exception: Blood Pressure Medication. See below.) . Transportation: Unless otherwise stated by your physician, you may drive yourself after the procedure. . Blood Pressure Medicine: Do not forget to take your blood pressure medicine with a sip of water the morning of the procedure. If your Diastolic (lower reading)is above 100 mmHg, elective cases will be cancelled/rescheduled. . Blood thinners: These will need to be stopped for procedures. Notify our staff if you are taking any blood thinners. Depending on which one you take, there will be specific instructions on how and when to stop it. . Diabetics on insulin: Notify the staff so that you can be scheduled 1st case in the morning. If your diabetes requires high dose insulin, take only  of your normal insulin dose the morning of the procedure and notify the staff that you have done so. . Preventing infections: Shower with an antibacterial soap the morning of your procedure. . Build-up your immune system: Take 1000 mg of Vitamin C with every meal (3 times a day) the day prior to your procedure. . Antibiotics: Inform the staff if you have a condition or reason that requires you to take antibiotics before dental procedures. . Pregnancy: If you are pregnant, call and cancel the procedure. . Sickness: If you have a cold, fever, or any active infections, call and cancel the procedure. . Arrival: You must be in the facility at least 30 minutes prior to your scheduled procedure. . Children: Do not bring children with you. . Dress appropriately:  Bring dark clothing that you would not mind if they get stained. . Valuables: Do not bring any jewelry or valuables.  Reasons to call and reschedule or cancel your procedure: (Following these recommendations will minimize the risk of a serious complication.) . Surgeries: Avoid having procedures within 2 weeks of any surgery. (Avoid for 2 weeks before or after any surgery). . Flu Shots: Avoid having procedures within 2 weeks of a flu shots or . (Avoid for 2 weeks before or after immunizations). . Barium: Avoid having a procedure within 7-10 days after having had a radiological study involving the use of radiological contrast. (Myelograms, Barium swallow or enema study). . Heart attacks: Avoid any elective procedures or surgeries for the initial 6 months after a "Myocardial Infarction" (Heart Attack). . Blood thinners: It is imperative that you stop these medications before procedures. Let us know if you if you take any blood thinner.  . Infection: Avoid procedures during or within two weeks of an infection (including chest colds or gastrointestinal problems). Symptoms associated with infections include: Localized redness, fever, chills, night sweats or profuse sweating, burning sensation when voiding, cough, congestion, stuffiness, runny nose, sore throat, diarrhea, nausea, vomiting, cold or Flu symptoms, recent or current infections. It is specially important if the infection is over the area that we intend to treat. . Heart and lung problems: Symptoms that may suggest an active cardiopulmonary problem include: cough, chest pain, breathing difficulties or shortness of breath, dizziness, ankle swelling, uncontrolled high or unusually low blood pressure, and/or palpitations. If you are experiencing any of these symptoms, cancel your procedure and contact your primary care physician for an evaluation.  Remember:  Regular Business hours are:    Monday to Thursday 8:00 AM to 4:00 PM  Provider's  Schedule: Maggie Senseney, MD:  Procedure days: Tuesday and Thursday 7:30 AM to 4:00 PM  Bilal Lateef, MD:  Procedure days: Monday and Wednesday 7:30 AM to 4:00 PM ____________________________________________________________________________________________   ____________________________________________________________________________________________  General Risks and Possible Complications  Patient Responsibilities: It is important that you read this as it is part of your informed consent. It is our duty to inform you of the risks and possible complications associated with treatments offered to you. It is your responsibility as a patient to read this and to ask questions about anything that is not clear or that you believe was not covered in this document.  Patient's Rights: You have the right to refuse treatment. You also have the right to change your mind, even after initially having agreed to have the treatment done. However, under this last option, if you wait until the last second to change your mind, you may be charged for the materials used up to that point.  Introduction: Medicine is not an exact science. Everything in Medicine, including the lack of treatment(s), carries the potential for danger, harm, or loss (which is by definition: Risk). In Medicine, a complication is a secondary problem, condition, or disease that can aggravate an already existing one. All treatments carry the risk of possible complications. The fact that a side effects or complications occurs, does not imply that the treatment was conducted incorrectly. It must be clearly understood that these can happen even when everything is done following the highest safety standards.  No treatment: You can choose not to proceed with the proposed treatment alternative. The "PRO(s)" would include: avoiding the risk of complications associated with the therapy. The "CON(s)" would include: not getting any of the treatment  benefits. These benefits fall under one of three categories: diagnostic; therapeutic; and/or palliative. Diagnostic benefits include: getting information which can ultimately lead to improvement of the disease or symptom(s). Therapeutic benefits are those associated with the successful treatment of the disease. Finally, palliative benefits are those related to the decrease of the primary symptoms, without necessarily curing the condition (example: decreasing the pain from a flare-up of a chronic condition, such as incurable terminal cancer).  General Risks and Complications: These are associated to most interventional treatments. They can occur alone, or in combination. They fall under one of the following six (6) categories: no benefit or worsening of symptoms; bleeding; infection; nerve damage; allergic reactions; and/or death. 1. No benefits or worsening of symptoms: In Medicine there are no guarantees, only probabilities. No healthcare provider can ever guarantee that a medical treatment will work, they can only state the probability that it may. Furthermore, there is always the possibility that the condition may worsen, either directly, or indirectly, as a consequence of the treatment. 2. Bleeding: This is more common if the patient is taking a blood thinner, either prescription or over the counter (example: Goody Powders, Fish oil, Aspirin, Garlic, etc.), or if suffering a condition associated with impaired coagulation (example: Hemophilia, cirrhosis of the liver, low platelet counts, etc.). However, even if you do not have one on these, it can still happen. If you have any of these conditions, or take one of these drugs, make sure to notify your treating physician. 3. Infection: This is more common in patients with a compromised immune system, either due to disease (example: diabetes, cancer, human immunodeficiency virus [HIV], etc.), or due to medications or treatments (example: therapies used to treat  cancer and   rheumatological diseases). However, even if you do not have one on these, it can still happen. If you have any of these conditions, or take one of these drugs, make sure to notify your treating physician. 4. Nerve Damage: This is more common when the treatment is an invasive one, but it can also happen with the use of medications, such as those used in the treatment of cancer. The damage can occur to small secondary nerves, or to large primary ones, such as those in the spinal cord and brain. This damage may be temporary or permanent and it may lead to impairments that can range from temporary numbness to permanent paralysis and/or brain death. 5. Allergic Reactions: Any time a substance or material comes in contact with our body, there is the possibility of an allergic reaction. These can range from a mild skin rash (contact dermatitis) to a severe systemic reaction (anaphylactic reaction), which can result in death. 6. Death: In general, any medical intervention can result in death, most of the time due to an unforeseen complication. ____________________________________________________________________________________________   

## 2020-05-13 ENCOUNTER — Other Ambulatory Visit: Payer: Self-pay

## 2020-05-13 ENCOUNTER — Other Ambulatory Visit: Payer: Self-pay | Admitting: Family Medicine

## 2020-05-13 ENCOUNTER — Encounter: Payer: Self-pay | Admitting: Family Medicine

## 2020-05-13 ENCOUNTER — Ambulatory Visit: Payer: Managed Care, Other (non HMO) | Admitting: Family Medicine

## 2020-05-13 VITALS — BP 132/82 | HR 94 | Temp 98.0°F | Ht 64.0 in | Wt 180.8 lb

## 2020-05-13 DIAGNOSIS — M5442 Lumbago with sciatica, left side: Secondary | ICD-10-CM

## 2020-05-13 DIAGNOSIS — Z1231 Encounter for screening mammogram for malignant neoplasm of breast: Secondary | ICD-10-CM

## 2020-05-13 DIAGNOSIS — M47816 Spondylosis without myelopathy or radiculopathy, lumbar region: Secondary | ICD-10-CM

## 2020-05-13 DIAGNOSIS — M5441 Lumbago with sciatica, right side: Secondary | ICD-10-CM

## 2020-05-13 DIAGNOSIS — G8929 Other chronic pain: Secondary | ICD-10-CM

## 2020-05-13 DIAGNOSIS — M461 Sacroiliitis, not elsewhere classified: Secondary | ICD-10-CM | POA: Diagnosis not present

## 2020-05-13 MED ORDER — MELOXICAM 15 MG PO TABS: 15.0000 mg | ORAL_TABLET | Freq: Every day | ORAL | 1 refills | Status: DC

## 2020-05-13 MED ORDER — TIZANIDINE HCL 4 MG PO CAPS: 4.0000 mg | ORAL_CAPSULE | Freq: Three times a day (TID) | ORAL | 1 refills | Status: DC

## 2020-05-13 NOTE — Patient Instructions (Signed)
Good to see you today  Watch your carbs, look at low carb/ keto vegan/ vegetarian  Please call and schedule an appointment for screening mammogram. A referral is not needed.  [phone]

## 2020-05-13 NOTE — Progress Notes (Signed)
   Subjective:    Patient ID: Denise Burns, female    DOB: [birth year], 49 y.o.   MRN: [id number]  HPI Chief Complaint  Patient presents with  . Medication Refill    baclofen and meloicam   This is a 49 yo female who presents today for follow up of back pain. Requests refills as noted above. She is seen regularly by pain management.  Provider has asked her to get her muscle relaxer and prescription NSAID from her PCP.  She has been on meloxicam for a long time, has had some strange dreams with baclofen and would like to try a different muscle relaxer.  Did not feel that cyclobenzaprine worked well for her.  She had a steroid injection which provided her with some relief but now the pain has shifted and she is going to have additional steroid injection in a different location.  Overdue some health maintenance.  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  She plans to follow-up in the next month or 2 for her annual visit and labs.  Has been trying to follow a vegan diet.  Trying to make healthier food choices.   Review of Systems Per HPI    Objective:   Physical Exam  Physical Exam  Vitals reviewed. Constitutional: Oriented to person, place, and time. Appears well-developed and well-nourished.  HENT:  Head: Normocephalic and atraumatic.  Eyes: Conjunctivae are normal.  Neck: Normal range of motion. Neck supple.  Cardiovascular: Normal rate.   Pulmonary/Chest: Effort normal.  Musculoskeletal: Normal range of motion.  Neurological: Alert and oriented to person, place, and time.  Psychiatric: Normal mood and affect. Behavior is normal. Judgment and thought content normal.     BP 132/82   Pulse 94   Temp 98 F (36.7 C) (Temporal)   Ht 5\' 4"  (1.626 m)   Wt 180 lb 12 oz (82 kg)   LMP 06/08/2018 (Exact Date)   SpO2 96%   BMI 31.03 kg/m  Wt Readings from Last 3 Encounters:  05/13/20 180 lb 12 oz (82 kg)  05/11/20 180 lb (81.6 kg)  02/20/20 172 lb (78 kg)         Assessment & Plan:  1. Chronic midline low back pain with bilateral sciatica - tiZANidine (ZANAFLEX) 4 MG capsule; Take 1 capsule (4 mg total) by mouth 3 (three) times daily.  Dispense: 30 capsule; Refill: 1 -Her plan is to continue follow-up with pain management  2. Osteoarthritis of sacroiliac joint (Bilateral) - meloxicam (MOBIC) 15 MG tablet; Take 1 tablet (15 mg total) by mouth daily.  Dispense: 90 tablet; Refill: 1  3. Osteoarthritis of facet joint of lumbar spine - meloxicam (MOBIC) 15 MG tablet; Take 1 tablet (15 mg total) by mouth daily.  Dispense: 90 tablet; Refill: 1  4. Encounter for screening mammogram for malignant neoplasm of breast -Provided her information to call and schedule screening mammogram but normal breast center  Follow-up in the next 1 to 2 months to catch up on health maintenance, CPE, labs  This visit occurred during the SARS-CoV-2 public health emergency.  Safety protocols were in place, including screening questions prior to the visit, additional usage of staff PPE, and extensive cleaning of exam room while observing appropriate contact time as indicated for disinfecting solutions.      Clarene Reamer, FNP-BC  [facility]  05/13/2020 3:47 PM

## 2020-05-18 ENCOUNTER — Telehealth: Payer: Self-pay

## 2020-05-18 NOTE — Telephone Encounter (Signed)
Insurance denied the [facility] because she has not had any PT and they didn't see any documentation of home exercises. Can you document anything about home exercises so I can try again to get approval?

## 2020-05-21 ENCOUNTER — Other Ambulatory Visit: Payer: Self-pay | Admitting: Family Medicine

## 2020-05-21 ENCOUNTER — Encounter: Payer: Self-pay | Admitting: Family Medicine

## 2020-05-21 DIAGNOSIS — G8929 Other chronic pain: Secondary | ICD-10-CM

## 2020-05-21 DIAGNOSIS — M5442 Lumbago with sciatica, left side: Secondary | ICD-10-CM

## 2020-05-21 MED ORDER — TIZANIDINE HCL 4 MG PO CAPS: 4.0000 mg | ORAL_CAPSULE | Freq: Three times a day (TID) | ORAL | 1 refills | Status: DC

## 2020-07-06 ENCOUNTER — Ambulatory Visit: Payer: Managed Care, Other (non HMO) | Admitting: Family Medicine

## 2020-07-06 ENCOUNTER — Other Ambulatory Visit: Payer: Self-pay | Admitting: Internal Medicine

## 2020-07-08 ENCOUNTER — Encounter: Payer: Self-pay | Admitting: Family Medicine

## 2020-07-08 ENCOUNTER — Other Ambulatory Visit: Payer: Self-pay

## 2020-07-08 ENCOUNTER — Ambulatory Visit: Payer: Managed Care, Other (non HMO) | Admitting: Family Medicine

## 2020-07-08 VITALS — BP 128/70 | HR 95 | Temp 98.1°F | Ht 65.0 in | Wt 178.8 lb

## 2020-07-08 DIAGNOSIS — J3089 Other allergic rhinitis: Secondary | ICD-10-CM

## 2020-07-08 NOTE — Patient Instructions (Signed)
You will get a call about allergy appointment  Try [employer]- you can usually find a coupon. Take 2 sprays in each nostril twice a day for 3 days then go to once a day

## 2020-07-08 NOTE — Progress Notes (Signed)
   Subjective:    Patient ID: Denise Burns, female    DOB: Aug 21, 1970, 49 y.o.   MRN: [id number]  HPI Chief Complaint  Patient presents with  . Allergies    Pt stated that she has been dealing with allergies but they have been getting worse over the past couple of mos   Symptoms of sinus pain, pressure, post nasal drainage, stuffy nose, sneezing, eyes watering, hoarse. Ear pressure.  Allergy testing many years ago, dog/cat/dust positive.  Immunotherapy was recommended but this was around the same time that she had Addison's disease diagnosis. Currently taking Claritin at night, Sudafed in am. Little relief with meds.  Stopped nasal sprays due to oral dermatitis.    Review of Systems Per HPI    Objective:   Physical Exam Vitals reviewed.  Constitutional:      General: She is not in acute distress.    Appearance: Normal appearance. She is normal weight. She is not ill-appearing, toxic-appearing or diaphoretic.  HENT:     Head: Normocephalic and atraumatic.     Right Ear: Tympanic membrane, ear canal and external ear normal.     Left Ear: Tympanic membrane, ear canal and external ear normal.     Nose: Mucosal edema and congestion present.     Mouth/Throat:     Mouth: Mucous membranes are moist.     Pharynx: Oropharynx is clear.  Eyes:     Conjunctiva/sclera: Conjunctivae normal.  Cardiovascular:     Rate and Rhythm: Normal rate and regular rhythm.     Heart sounds: Normal heart sounds.  Pulmonary:     Effort: Pulmonary effort is normal.     Breath sounds: Normal breath sounds.  Musculoskeletal:     Cervical back: Normal range of motion and neck supple.  Lymphadenopathy:     Cervical: No cervical adenopathy.  Skin:    General: Skin is warm and dry.  Neurological:     Mental Status: She is alert and oriented to person, place, and time.       BP 128/70 (BP Location: Left Arm, Patient Position: Sitting, Cuff Size: Normal)   Pulse 95   Temp 98.1 F (36.7 C) (Temporal)    Ht 5\' 5"  (1.651 m)   Wt 178 lb 12.8 oz (81.1 kg)   LMP 06/08/2018 (Exact Date)   SpO2 95%   BMI 29.75 kg/m  Wt Readings from Last 3 Encounters:  07/08/20 178 lb 12.8 oz (81.1 kg)  05/13/20 180 lb 12 oz (82 kg)  05/11/20 180 lb (81.6 kg)       Assessment & Plan:  1. Non-seasonal allergic rhinitis, unspecified trigger -  Patient Instructions  You will get a call about allergy appointment  Try [employer]- you can usually find a coupon. Take 2 sprays in each nostril twice a day for 3 days then go to once a day   - Ambulatory referral to [place], FNP-BC  [facility] [place]  07/08/2020 5:24 PM

## 2020-08-02 NOTE — Progress Notes (Signed)
PROVIDER NOTE: Information contained herein reflects review and annotations entered in association with encounter. Interpretation of such information and data should be left to medically-trained personnel. Information provided to patient can be located elsewhere in the medical record under "Patient Instructions". Document created using STT-dictation technology, any transcriptional errors that may result from process are unintentional.    Patient: Denise Burns  Service Category: E/M  Provider: [name], MD  DOB: 1970-08-14  DOS: 08/03/2020  Specialty: Interventional Pain Management  MRN: [id number]  Setting: Ambulatory outpatient  PCP: Elby Beck, FNP  Type: Established Patient    Referring Provider: Elby Beck, FNP  Location: Office  Delivery: Face-to-face     HPI  [name], a 50 y.o. year old female, is here today because of her Chronic pain syndrome [G89.4]. Ms. Fultz's primary complain today is Hip Pain Last encounter: My last encounter with her was on 05/11/2020. Pertinent problems: [name] has Chronic midline low back pain with bilateral sciatica; Chronic low back pain (Primary Area of Pain) (Bilateral) (R>L) w/o sciatica; Chronic sacroiliac joint pain (Secondary Area of Pain) (Right); Chronic pain syndrome; Abnormal MRI, lumbar spine (03/22/2018); Lumbar facet hypertrophy (Multilevel) (Bilateral); Lumbar facet syndrome (Bilateral); DDD (degenerative disc disease), lumbosacral; Spondylolisthesis (8 mm) at L5-S1 level; Lumbar Grade 1 Retrolisthesis (2 mm) of L2/L3; L5-S1 pars defect w/ spondylolisthesis (Bilateral); Lumbar pars defect (L5-S1) (Bilateral); Lumbar foraminal stenosis (Left: L2-3) (Bilateral: L5-S1); Osteoarthritis of sacroiliac joint (Bilateral); Chronic musculoskeletal pain; Neurogenic pain; Osteoarthritis of facet joint of lumbar spine; Greater trochanteric bursitis (Bilateral); Lumbar spondylosis; Spondylosis without myelopathy or radiculopathy,  lumbosacral region; Acute postoperative pain; Chronic hip pain (Right); Osteoarthritis of hip (Right); Lumbar radiculitis (L1/L2) (Right); Chronic groin pain (Right); and Chronic hip pain (Bilateral) on their pertinent problem list. Pain Assessment: Severity of Chronic pain is reported as a 4 /10. Location: Hip Right,Left/pain radiaites down both hip and leg at times. Onset: More than a month ago. Quality: Shooting,Aching. Timing: Intermittent. Modifying factor(s): meds and laying down. Vitals:  height is 5' 4"  (1.626 m) and weight is 175 lb (79.4 kg). Her temperature is 97.2 F (36.2 C) (abnormal). Her blood pressure is 157/98 (abnormal) and her pulse is 93. Her oxygen saturation is 100%.   Reason for encounter: medication management.   The patient indicates doing well with the current medication regimen. No adverse reactions or side effects reported to the medications. PMP & UDS compliant.  She refers still having bilateral hip pain and she would like to go ahead and have the hip joint injection done.  Previously we had requested this, but for some unknown reason it was not approved at that time.  However.  The patient does have clear evidence of bilateral hip arthralgia on Patrick maneuver with bilateral decreased range of motion.  Available x-rays do show osteoarthritis.  Will have the patient come in for diagnostic bilateral intra-articular hip joint injection under fluoroscopic guidance and IV sedation.  RTCB: 11/07/2020  Pharmacotherapy Assessment   Analgesic: Hydrocodone/APAP 7.5/325 1 tablet p.o. 3 times daily (22.5 mg/day of hydrocodone)  MME/day:22.64m/day.   Monitoring: [place] PMP: PDMP reviewed during this encounter.       Pharmacotherapy: No side-effects or adverse reactions reported. Compliance: No problems identified. Effectiveness: Clinically acceptable.  BChauncey Fischer RN  08/03/2020 10:54 AM  Sign when Signing Visit Nursing Pain Medication Assessment:  Safety precautions to be  maintained throughout the outpatient stay will include: orient to surroundings, keep bed in low position, maintain call bell  within reach at all times, provide assistance with transfer out of bed and ambulation.  Medication Inspection Compliance: Pill count conducted under aseptic conditions, in front of the patient. Neither the pills nor the bottle was removed from the patient's sight at any time. Once count was completed pills were immediately returned to the patient in their original bottle.  Medication: Hydrocodone/APAP Pill/Patch Count: 17 of 90 pills remain Pill/Patch Appearance: Markings consistent with prescribed medication Bottle Appearance: Standard pharmacy container. Clearly labeled. Filled Date: 63 / 10 / 21 Last Medication intake:  TodaySafety precautions to be maintained throughout the outpatient stay will include: orient to surroundings, keep bed in low position, maintain call bell within reach at all times, provide assistance with transfer out of bed and ambulation.     UDS:  Summary  Date Value Ref Range Status  02/11/2020 Note  Final    Comment:    ==================================================================== [employer] [facility] 13 (MW) ==================================================================== Test                             Result       Flag       Units  Drug Present not Declared for Prescription Verification   Alprazolam                     23           UNEXPECTED ng/mg creat   Alpha-hydroxyalprazolam        16           UNEXPECTED ng/mg creat    Source of alprazolam is a scheduled prescription medication. Alpha-    hydroxyalprazolam is an expected metabolite of alprazolam.    Oxycodone                      2136         UNEXPECTED ng/mg creat   Oxymorphone                    148          UNEXPECTED ng/mg creat   Noroxycodone                   5926         UNEXPECTED ng/mg creat   Noroxymorphone                 324          UNEXPECTED ng/mg creat     Sources of oxycodone are scheduled prescription medications.    Oxymorphone, noroxycodone, and noroxymorphone are expected    metabolites of oxycodone. Oxymorphone is also available as a    scheduled prescription medication.    Butalbital                     PRESENT      UNEXPECTED  Drug Absent but Declared for Prescription Verification   Hydrocodone                    Not Detected UNEXPECTED ng/mg creat ==================================================================== Test                      Result    Flag   Units      Ref Range   Creatinine              146  mg/dL      >=20 ==================================================================== Declared Medications:  The flagging and interpretation on this report are based on the  following declared medications.  Unexpected results may arise from  inaccuracies in the declared medications.   **Note: The testing scope of this panel includes these medications:   Hydrocodone   **Note: The testing scope of this panel does not include the  following reported medications:   Acetaminophen  Baclofen  Cetirizine  Fludrocortisone  Hydrocortisone  Meloxicam  Pseudoephedrine (Sudafed) ==================================================================== For clinical consultation, please call [phone]. ====================================================================      ROS  Constitutional: Denies any fever or chills Gastrointestinal: No reported hemesis, hematochezia, vomiting, or acute GI distress Musculoskeletal: Denies any acute onset joint swelling, redness, loss of ROM, or weakness Neurological: No reported episodes of acute onset apraxia, aphasia, dysarthria, agnosia, amnesia, paralysis, loss of coordination, or loss of consciousness  Medication Review  HYDROcodone-acetaminophen, fludrocortisone, hydrocortisone, meloxicam, pseudoephedrine, and tiZANidine  History Review  Allergy: Ms. Omara is  allergic to lisinopril, ultram [tramadol], and sulfa antibiotics. Drug: Ms. Dusing  reports no history of drug use. Alcohol:  reports current alcohol use. Tobacco:  reports that she has quit smoking. She has never used smokeless tobacco. Social: Ms. Marseille  reports that she has quit smoking. She has never used smokeless tobacco. She reports current alcohol use. She reports that she does not use drugs. Medical:  has a past medical history of Addison disease ([place]), Arthritis, Back pain, and GERD (gastroesophageal reflux disease). Surgical: Ms. Grumbine  has a past surgical history that includes Cholecystectomy and Polypectomy. Family: family history includes Alcohol abuse in her father; Arthritis in her mother; Hearing loss in her father; Hyperlipidemia in her father and mother; Hypertension in her mother.  Laboratory Chemistry Profile   Renal Lab Results  Component Value Date   BUN 16 08/13/2019   CREATININE 0.84 08/13/2019   BCR 18 08/16/2018   GFR 72.11 08/13/2019   GFRAA 86 08/16/2018   GFRNONAA 74 08/16/2018     Hepatic Lab Results  Component Value Date   AST 18 08/16/2018   ALBUMIN 4.4 08/16/2018   ALKPHOS 64 08/16/2018     Electrolytes Lab Results  Component Value Date   NA 141 08/13/2019   K 3.6 08/13/2019   CL 106 08/13/2019   CALCIUM 8.9 08/13/2019   MG 2.1 08/16/2018     Bone Lab Results  Component Value Date   VD25OH 13.55 (L) 04/30/2018   25OHVITD1 67 08/16/2018   25OHVITD2 <1.0 08/16/2018   25OHVITD3 67 08/16/2018     Inflammation (CRP: Acute Phase) (ESR: Chronic Phase) Lab Results  Component Value Date   CRP 2 08/16/2018   ESRSEDRATE 37 (H) 08/16/2018       Note: Above Lab results reviewed.  Recent Imaging Review  DG HIP UNILAT W OR W/O PELVIS 2-3 VIEWS LEFT CLINICAL DATA:  Left hip pain.  EXAM: DG HIP (WITH OR WITHOUT PELVIS) 2-3V LEFT  COMPARISON:  None.  FINDINGS: There is no acute bony or joint abnormality. Joint spaces  are preserved. No erosion, osteophytosis, subchondral sclerosis or subchondral cyst formation is identified. No evidence of avascular necrosis of the femoral heads. No focal bony lesion. Soft tissues are negative.  IMPRESSION: Normal examination.  Electronically Signed   By: Inge Rise M.D.   On: 08/03/2020 13:02 Note: Reviewed        Physical Exam  General appearance: Well nourished, well developed, and well hydrated. In no apparent acute distress Mental status:  Alert, oriented x 3 (person, place, & time)       Respiratory: No evidence of acute respiratory distress Eyes: PERLA Vitals: BP (!) 157/98   Pulse 93   Temp (!) 97.2 F (36.2 C)   Ht 5' 4"  (1.626 m)   Wt 175 lb (79.4 kg)   LMP 06/08/2018 (Exact Date)   SpO2 100%   BMI 30.04 kg/m  BMI: Estimated body mass index is 30.04 kg/m as calculated from the following:   Height as of this encounter: 5' 4"  (1.626 m).   Weight as of this encounter: 175 lb (79.4 kg). Ideal: Ideal body weight: 54.7 kg (120 lb 9.5 oz) Adjusted ideal body weight: 64.6 kg (142 lb 5.7 oz)  Assessment   Status Diagnosis  Controlled Controlled Controlled 1. Chronic pain syndrome   2. Chronic hip pain (Bilateral)   3. Greater trochanteric bursitis (Bilateral)   4. Osteoarthritis of hip (Right)      Updated Problems: Problem  Chronic hip pain (Bilateral)  Greater trochanteric bursitis (Bilateral)    Plan of Care  Problem-specific:  No problem-specific Assessment & Plan notes found for this encounter.  Ms. Emmalyn Hinson has a current medication list which includes the following long-term medication(s): [START ON 08/09/2020] hydrocodone-acetaminophen, [START ON 09/08/2020] hydrocodone-acetaminophen, [START ON 10/08/2020] hydrocodone-acetaminophen, and meloxicam.  Pharmacotherapy (Medications Ordered): Meds ordered this encounter  Medications  . HYDROcodone-acetaminophen (NORCO) 7.5-325 MG tablet    Sig: Take 1 tablet by mouth every 8  (eight) hours as needed for severe pain. Must last 30 days.    Dispense:  90 tablet    Refill:  0    Chronic Pain: STOP Act (Not applicable) Fill 1 day early if closed on refill date. Avoid benzodiazepines within 8 hours of opioids  . HYDROcodone-acetaminophen (NORCO) 7.5-325 MG tablet    Sig: Take 1 tablet by mouth every 8 (eight) hours as needed for severe pain. Must last 30 days.    Dispense:  90 tablet    Refill:  0    Chronic Pain: STOP Act (Not applicable) Fill 1 day early if closed on refill date. Avoid benzodiazepines within 8 hours of opioids  . HYDROcodone-acetaminophen (NORCO) 7.5-325 MG tablet    Sig: Take 1 tablet by mouth every 8 (eight) hours as needed for severe pain. Must last 30 days.    Dispense:  90 tablet    Refill:  0    Chronic Pain: STOP Act (Not applicable) Fill 1 day early if closed on refill date. Avoid benzodiazepines within 8 hours of opioids   Orders:  Orders Placed This Encounter  Procedures  . HIP INJECTION    Standing Status:   Future    Standing Expiration Date:   11/01/2020    Scheduling Instructions:     Side: Bilateral     Sedation: With Sedation.     Timeframe: As soon as schedule allows  . DG HIP UNILAT W OR W/O PELVIS 2-3 VIEWS LEFT    Please describe any evidence of DJD, such as joint narrowing, asymmetry, cysts, or any anomalies in bone density, production, or erosion.    Standing Status:   Future    Number of Occurrences:   1    Standing Expiration Date:   09/03/2020    Scheduling Instructions:     Imaging must be done as soon as possible. Inform patient that order will expire within 30 days and I will not renew it.    Order Specific Question:   Reason for  Exam (SYMPTOM  OR DIAGNOSIS REQUIRED)    Answer:   Left hip pain/arthralgia    Order Specific Question:   Is the patient pregnant?    Answer:   No    Order Specific Question:   Preferred imaging location?    Answer:   [facility]    Order Specific Question:   Call Results- Best  Contact Number?    Answer:   [phone] [facility])   Follow-up plan:   Return for Procedure (w/ sedation): (B) IA Hip inj. .      Considering: Therapeutic bilateral lumbar facetRFA #1  [place] joint Blk PossiblebilateralSI joint RFA Diagnostic bilateral L5 TFESI Diagnostic left-sided L2 TFESI   PRN Procedures: Palliative bilateral lumbar facet block #3 Palliative right lumbar facet RFA #2 (last done 01/07/2020) Palliative left lumbar facet RFA #2 (last done 02/04/2020)       Recent Visits Date Type Provider Dept  05/11/20 Office Visit Milinda Pointer, MD [facility]  Showing recent visits within past 90 days and meeting all other requirements Today's Visits Date Type Provider Dept  08/03/20 Office Visit Milinda Pointer, MD [facility]  Showing today's visits and meeting all other requirements Future Appointments No visits were found meeting these conditions. Showing future appointments within next 90 days and meeting all other requirements  I discussed the assessment and treatment plan with the patient. The patient was provided an opportunity to ask questions and all were answered. The patient agreed with the plan and demonstrated an understanding of the instructions.  Patient advised to call back or seek an in-person evaluation if the symptoms or condition worsens.  Duration of encounter: 30 minutes.  Note by: [name], MD Date: 08/03/2020; Time: 1:15 PM

## 2020-08-03 ENCOUNTER — Encounter: Payer: Self-pay | Admitting: Pain Medicine

## 2020-08-03 ENCOUNTER — Other Ambulatory Visit: Payer: Self-pay

## 2020-08-03 ENCOUNTER — Ambulatory Visit
Admission: RE | Admit: 2020-08-03 | Discharge: 2020-08-03 | Disposition: A | Discharge status: Home / Self Care | Payer: BC Managed Care – PPO | Source: Ambulatory Visit | Attending: Pain Medicine | Admitting: Pain Medicine

## 2020-08-03 ENCOUNTER — Other Ambulatory Visit: Payer: Self-pay | Admitting: Pain Medicine

## 2020-08-03 ENCOUNTER — Ambulatory Visit (HOSPITAL_BASED_OUTPATIENT_CLINIC_OR_DEPARTMENT_OTHER): Payer: BC Managed Care – PPO | Admitting: Pain Medicine

## 2020-08-03 VITALS — BP 157/98 | HR 93 | Temp 97.2°F | Ht 64.0 in | Wt 175.0 lb

## 2020-08-03 DIAGNOSIS — M25552 Pain in left hip: Secondary | ICD-10-CM

## 2020-08-03 DIAGNOSIS — G8929 Other chronic pain: Secondary | ICD-10-CM | POA: Insufficient documentation

## 2020-08-03 DIAGNOSIS — M7061 Trochanteric bursitis, right hip: Secondary | ICD-10-CM

## 2020-08-03 DIAGNOSIS — M25551 Pain in right hip: Secondary | ICD-10-CM | POA: Insufficient documentation

## 2020-08-03 DIAGNOSIS — M1611 Unilateral primary osteoarthritis, right hip: Secondary | ICD-10-CM

## 2020-08-03 DIAGNOSIS — M7062 Trochanteric bursitis, left hip: Secondary | ICD-10-CM | POA: Insufficient documentation

## 2020-08-03 DIAGNOSIS — G894 Chronic pain syndrome: Secondary | ICD-10-CM

## 2020-08-03 DIAGNOSIS — M76892 Other specified enthesopathies of left lower limb, excluding foot: Secondary | ICD-10-CM

## 2020-08-03 MED ORDER — HYDROCODONE-ACETAMINOPHEN 7.5-325 MG PO TABS: 1.0000 | ORAL_TABLET | Freq: Three times a day (TID) | ORAL | 0 refills | Status: DC | PRN

## 2020-08-03 NOTE — Progress Notes (Signed)
Nursing Pain Medication Assessment:  Safety precautions to be maintained throughout the outpatient stay will include: orient to surroundings, keep bed in low position, maintain call bell within reach at all times, provide assistance with transfer out of bed and ambulation.  Medication Inspection Compliance: Pill count conducted under aseptic conditions, in front of the patient. Neither the pills nor the bottle was removed from the patient's sight at any time. Once count was completed pills were immediately returned to the patient in their original bottle.  Medication: Hydrocodone/APAP Pill/Patch Count: 17 of 90 pills remain Pill/Patch Appearance: Markings consistent with prescribed medication Bottle Appearance: Standard pharmacy container. Clearly labeled. Filled Date: 58 / 10 / 21 Last Medication intake:  TodaySafety precautions to be maintained throughout the outpatient stay will include: orient to surroundings, keep bed in low position, maintain call bell within reach at all times, provide assistance with transfer out of bed and ambulation.

## 2020-08-03 NOTE — Patient Instructions (Signed)
____________________________________________________________________________________________  Preparing for Procedure with Sedation  Procedure appointments are limited to planned procedures: . No Prescription Refills. . No disability issues will be discussed. . No medication changes will be discussed.  Instructions: . Oral Intake: Do not eat or drink anything for at least 8 hours prior to your procedure. (Exception: Blood Pressure Medication. See below.) . Transportation: Unless otherwise stated by your physician, you may drive yourself after the procedure. . Blood Pressure Medicine: Do not forget to take your blood pressure medicine with a sip of water the morning of the procedure. If your Diastolic (lower reading)is above 100 mmHg, elective cases will be cancelled/rescheduled. . Blood thinners: These will need to be stopped for procedures. Notify our staff if you are taking any blood thinners. Depending on which one you take, there will be specific instructions on how and when to stop it. . Diabetics on insulin: Notify the staff so that you can be scheduled 1st case in the morning. If your diabetes requires high dose insulin, take only  of your normal insulin dose the morning of the procedure and notify the staff that you have done so. . Preventing infections: Shower with an antibacterial soap the morning of your procedure. . Build-up your immune system: Take 1000 mg of Vitamin C with every meal (3 times a day) the day prior to your procedure. . Antibiotics: Inform the staff if you have a condition or reason that requires you to take antibiotics before dental procedures. . Pregnancy: If you are pregnant, call and cancel the procedure. . Sickness: If you have a cold, fever, or any active infections, call and cancel the procedure. . Arrival: You must be in the facility at least 30 minutes prior to your scheduled procedure. . Children: Do not bring children with you. . Dress appropriately:  Bring dark clothing that you would not mind if they get stained. . Valuables: Do not bring any jewelry or valuables.  Reasons to call and reschedule or cancel your procedure: (Following these recommendations will minimize the risk of a serious complication.) . Surgeries: Avoid having procedures within 2 weeks of any surgery. (Avoid for 2 weeks before or after any surgery). . Flu Shots: Avoid having procedures within 2 weeks of a flu shots or . (Avoid for 2 weeks before or after immunizations). . Barium: Avoid having a procedure within 7-10 days after having had a radiological study involving the use of radiological contrast. (Myelograms, Barium swallow or enema study). . Heart attacks: Avoid any elective procedures or surgeries for the initial 6 months after a "Myocardial Infarction" (Heart Attack). . Blood thinners: It is imperative that you stop these medications before procedures. Let us know if you if you take any blood thinner.  . Infection: Avoid procedures during or within two weeks of an infection (including chest colds or gastrointestinal problems). Symptoms associated with infections include: Localized redness, fever, chills, night sweats or profuse sweating, burning sensation when voiding, cough, congestion, stuffiness, runny nose, sore throat, diarrhea, nausea, vomiting, cold or Flu symptoms, recent or current infections. It is specially important if the infection is over the area that we intend to treat. . Heart and lung problems: Symptoms that may suggest an active cardiopulmonary problem include: cough, chest pain, breathing difficulties or shortness of breath, dizziness, ankle swelling, uncontrolled high or unusually low blood pressure, and/or palpitations. If you are experiencing any of these symptoms, cancel your procedure and contact your primary care physician for an evaluation.  Remember:  Regular Business hours are:    Monday to Thursday 8:00 AM to 4:00 PM  Provider's  Schedule: Sy Saintjean, MD:  Procedure days: Tuesday and Thursday 7:30 AM to 4:00 PM  Bilal Lateef, MD:  Procedure days: Monday and Wednesday 7:30 AM to 4:00 PM ____________________________________________________________________________________________   ____________________________________________________________________________________________  General Risks and Possible Complications  Patient Responsibilities: It is important that you read this as it is part of your informed consent. It is our duty to inform you of the risks and possible complications associated with treatments offered to you. It is your responsibility as a patient to read this and to ask questions about anything that is not clear or that you believe was not covered in this document.  Patient's Rights: You have the right to refuse treatment. You also have the right to change your mind, even after initially having agreed to have the treatment done. However, under this last option, if you wait until the last second to change your mind, you may be charged for the materials used up to that point.  Introduction: Medicine is not an exact science. Everything in Medicine, including the lack of treatment(s), carries the potential for danger, harm, or loss (which is by definition: Risk). In Medicine, a complication is a secondary problem, condition, or disease that can aggravate an already existing one. All treatments carry the risk of possible complications. The fact that a side effects or complications occurs, does not imply that the treatment was conducted incorrectly. It must be clearly understood that these can happen even when everything is done following the highest safety standards.  No treatment: You can choose not to proceed with the proposed treatment alternative. The "PRO(s)" would include: avoiding the risk of complications associated with the therapy. The "CON(s)" would include: not getting any of the treatment  benefits. These benefits fall under one of three categories: diagnostic; therapeutic; and/or palliative. Diagnostic benefits include: getting information which can ultimately lead to improvement of the disease or symptom(s). Therapeutic benefits are those associated with the successful treatment of the disease. Finally, palliative benefits are those related to the decrease of the primary symptoms, without necessarily curing the condition (example: decreasing the pain from a flare-up of a chronic condition, such as incurable terminal cancer).  General Risks and Complications: These are associated to most interventional treatments. They can occur alone, or in combination. They fall under one of the following six (6) categories: no benefit or worsening of symptoms; bleeding; infection; nerve damage; allergic reactions; and/or death. 1. No benefits or worsening of symptoms: In Medicine there are no guarantees, only probabilities. No healthcare provider can ever guarantee that a medical treatment will work, they can only state the probability that it may. Furthermore, there is always the possibility that the condition may worsen, either directly, or indirectly, as a consequence of the treatment. 2. Bleeding: This is more common if the patient is taking a blood thinner, either prescription or over the counter (example: Goody Powders, Fish oil, Aspirin, Garlic, etc.), or if suffering a condition associated with impaired coagulation (example: Hemophilia, cirrhosis of the liver, low platelet counts, etc.). However, even if you do not have one on these, it can still happen. If you have any of these conditions, or take one of these drugs, make sure to notify your treating physician. 3. Infection: This is more common in patients with a compromised immune system, either due to disease (example: diabetes, cancer, human immunodeficiency virus [HIV], etc.), or due to medications or treatments (example: therapies used to treat  cancer and   rheumatological diseases). However, even if you do not have one on these, it can still happen. If you have any of these conditions, or take one of these drugs, make sure to notify your treating physician. 4. Nerve Damage: This is more common when the treatment is an invasive one, but it can also happen with the use of medications, such as those used in the treatment of cancer. The damage can occur to small secondary nerves, or to large primary ones, such as those in the spinal cord and brain. This damage may be temporary or permanent and it may lead to impairments that can range from temporary numbness to permanent paralysis and/or brain death. 5. Allergic Reactions: Any time a substance or material comes in contact with our body, there is the possibility of an allergic reaction. These can range from a mild skin rash (contact dermatitis) to a severe systemic reaction (anaphylactic reaction), which can result in death. 6. Death: In general, any medical intervention can result in death, most of the time due to an unforeseen complication. ____________________________________________________________________________________________   

## 2020-08-11 ENCOUNTER — Other Ambulatory Visit: Payer: Self-pay

## 2020-08-13 ENCOUNTER — Ambulatory Visit: Payer: BC Managed Care – PPO | Admitting: Internal Medicine

## 2020-08-13 ENCOUNTER — Other Ambulatory Visit: Payer: Self-pay

## 2020-08-13 ENCOUNTER — Encounter: Payer: Self-pay | Admitting: Internal Medicine

## 2020-08-13 VITALS — BP 152/92 | HR 90 | Ht 63.25 in | Wt 182.4 lb

## 2020-08-13 DIAGNOSIS — E2839 Other primary ovarian failure: Secondary | ICD-10-CM

## 2020-08-13 DIAGNOSIS — E271 Primary adrenocortical insufficiency: Secondary | ICD-10-CM | POA: Diagnosis not present

## 2020-08-13 DIAGNOSIS — L659 Nonscarring hair loss, unspecified: Secondary | ICD-10-CM

## 2020-08-13 NOTE — Patient Instructions (Signed)
MEDICATIONS:   - Hydrocortisone 15 mg every morning - Hydrocortisone 5 mg every afternoon ( Between 2-4 pm) - Continue Florinef at half a tablet daily       ADRENAL INSUFFICIENCY SICK DAY RULES:  Should you face an extreme emotional or physical stress such as trauma, surgery or acute illness, this will require extra steroid coverage so that the body can meet that stress.   Without increasing the steroid dose you may experience severe weakness, headache, dizziness, nausea and vomiting and possibly a more serious deterioration in health.  Typically the dose of steroids will only need to be increased for a couple of days if you have an illness that is transient and managed in the community.   If you are unable to take/absorb an increased dose of steroids orally because of vomiting or diarrhea, you will urgently require steroid injections and should present to an Emergency Department.  The general advice for any serious illness is as follows: 1. Double the normal daily steroid dose for up to 3 days if you have a temperature of more than 37.50C (99.50F) with signs of sickness, or severe emotional or physical distress 2. Contact your primary care doctor and Endocrinologist if the illness worsens or it lasts for more than 3 days.  3. In cases of severe illness, urgent medical assistance should be promptly sought. 4. If you experience vomiting/diarrhea or are unable to take steroids by mouth, please administer the Hydrocortisone injection kit and seek urgent medical help.      

## 2020-08-13 NOTE — Progress Notes (Signed)
Name: Denise Burns  MRN/ DOB: [id number], 04/29/1971    Age/ Sex: 50 y.o., female     PCP: Elby Beck, FNP (Inactive)   Reason for Endocrinology Evaluation: Addison's Disease     Initial Endocrinology Clinic Visit: 08/06/2018    PATIENT IDENTIFIER: Denise Burns is a 50 y.o., female with a past medical history of Addison's Disease and premature ovarian  failure . She has followed with [facility] since 08/06/2018 for consultative assistance with management of her adrenal insufficiency.   HISTORICAL SUMMARY: The patient was first diagnosed with addison's disease in 2006. She presented with weight loss, hyperpigmentation, and  hypotension. She did not have cosyntropin stim test. She has been on [facility] and Florinef since her diagnosis.   Premature ovarian failure ~ 2017. She did have a postmenopausal bleed between 05/2018- 06/2018. Follows with [facility]   No FH of premature ovarian failure or autoimmune disease.   SUBJECTIVE:   During last visit (02/05/2019): Continued HC dose 15 mg in AM and 5 mg QPM and fludrocortisone    Today (08/13/2020):  Denise Burns is here for a 1 year follow up on adrenal insufficiency. She has been compliant with HC intake.   No weight loss noted  She denies  nausea, dizziness or diarrhea.  She continues to wear her bracelet.   She does receive intra-articular injection of the spine    She has established with new Gyn but no interventions so far, found ovarian cysts, further imaging delayed due to [place].  Dropped puppy for surgery and is stressed out and attributes this to elevated BP  Had sporadic menstruations    Has noted hair loss for ~ 6 months mainly at the vertex , slight increase in her growth around the lips  HOME ENDOCRINE MEDICATIONS: Hydrocortisone 15 mg QAM Hydrocortisone 5 mh QPM (between 2-4 pm) Fludrocortisone  0.1 mg , at half a tablet daily       HISTORY:  Past Medical History:  Past Medical  History:  Diagnosis Date  . Addison disease ([place])   . Arthritis   . Back pain   . GERD (gastroesophageal reflux disease)    Past Surgical History:  Past Surgical History:  Procedure Laterality Date  . CHOLECYSTECTOMY    . POLYPECTOMY      Social History:  reports that she has quit smoking. She has never used smokeless tobacco. She reports current alcohol use. She reports that she does not use drugs. Family History:  Family History  Problem Relation Age of Onset  . Arthritis Mother   . Hyperlipidemia Mother   . Hypertension Mother   . Alcohol abuse Father   . Hearing loss Father   . Hyperlipidemia Father      HOME MEDICATIONS: Allergies as of 08/13/2020      Reactions   Lisinopril Cough   Ultram [tramadol] Other (See Comments)   Sulfa Antibiotics Other (See Comments)      Medication List       Accurate as of August 13, 2020  9:03 AM. If you have any questions, ask your nurse or doctor.        fludrocortisone 0.1 MG tablet Commonly known as: FLORINEF TAKE 1/2 TABLET BY MOUTH DAILY   HYDROcodone-acetaminophen 7.5-325 MG tablet Commonly known as: NORCO Take 1 tablet by mouth every 8 (eight) hours as needed for severe pain. Must last 30 days.   HYDROcodone-acetaminophen 7.5-325 MG tablet Commonly known as: NORCO Take 1 tablet by mouth every 8 (  eight) hours as needed for severe pain. Must last 30 days. Start taking on: September 08, 2020   HYDROcodone-acetaminophen 7.5-325 MG tablet Commonly known as: NORCO Take 1 tablet by mouth every 8 (eight) hours as needed for severe pain. Must last 30 days. Start taking on: October 08, 2020   hydrocortisone 10 MG tablet Commonly known as: CORTEF TAKE 1 & 1/2 TABLETS (15 MG) BY MOUTH DAILY WITH BREAKFAST AND 1/2 TABLETS (5 MG) DAILY (IN THE EVENING).   meloxicam 15 MG tablet Commonly known as: MOBIC Take 1 tablet (15 mg total) by mouth daily.   pseudoephedrine 30 MG tablet Commonly known as: SUDAFED Take 30 mg by mouth  every 12 (twelve) hours as needed for congestion.   tiZANidine 4 MG capsule Commonly known as: Zanaflex Take 1 capsule (4 mg total) by mouth 3 (three) times daily.         OBJECTIVE:   PHYSICAL EXAM: VS: BP (!) 152/92   Pulse 90   Ht 5' 3.25" (1.607 m)   Wt 182 lb 6 oz (82.7 kg)   LMP 06/08/2018 (Exact Date)   SpO2 99%   BMI 32.05 kg/m    EXAM: General: Pt appears well and is in NAD  Neck: General: Supple without adenopathy. Thyroid: Thyroid size normal.    Lungs: Clear with good BS bilat with no rales, rhonchi, or wheezes  Heart: Auscultation: RRR.  Abdomen: Normoactive bowel sounds, soft, nontender, without masses or organomegaly palpable  Extremities:  BL LE: No pretibial edema normal ROM and strength.  Mental Status: Judgment, insight: Intact Orientation: Oriented to time, place, and person Mood and affect: No depression, anxiety, or agitation     DATA REVIEWED:   Results for Denise Burns, Denise Burns (MRN [id number]) as of 08/14/2020 10:43  Ref. Range 08/13/2020 09:24  Sodium Latest Ref Range: 135 - 146 mmol/L 141  Potassium Latest Ref Range: 3.5 - 5.3 mmol/L 3.6  Chloride Latest Ref Range: 98 - 110 mmol/L 105  CO2 Latest Ref Range: 20 - 32 mmol/L 27  Glucose Latest Ref Range: 65 - 99 mg/dL 97  BUN Latest Ref Range: 7 - 25 mg/dL 22  Creatinine Latest Ref Range: 0.50 - 1.10 mg/dL 0.76  Calcium Latest Ref Range: 8.6 - 10.2 mg/dL 9.5  BUN/Creatinine Ratio Latest Ref Range: 6 - 22 (calc) NOT APPLICABLE  DHEA-SO4 Latest Ref Range: 19 - 231 mcg/dL <2 (L)  LH Latest Units: mIU/mL 58.7  FSH Latest Units: mIU/mL 122.1 (H)  Prolactin Latest Units: ng/mL 8.2  Estradiol Latest Units: pg/mL 18  TSH Latest Units: mIU/L 1.13  T4,Free(Direct) Latest Ref Range: 0.8 - 1.8 ng/dL 0.9      ASSESSMENT / PLAN / RECOMMENDATIONS:   1. Primary Adrenal Insufficiency (Addison's Disease)    - Reviewed the sick day rule - She already wears a medical alert bracelet. - Has the 10 mg  tablets of HC - BMP normal today  - We discussed switching HC to prednisone for ease of taking , but the patient will feels nauseous with prednisone - No changes today   Medications : - Hydrocortisone 15 mg QAM - Hydrocortisone 5 mh QPM (between 2-4 pm) - Continue Fludrocortisone  0.1 mg , at half a tablet daily    2. Premature Ovarian Failure :  - Used to follow with Dr. Nelda Marseille but Dr. Nelda Marseille has left the practice, pt encouraged to reestablish care with Gyn   3. Hair thinning :    - TFT's are normal  - DHEAS  Not elevated  - Testosterone -pending   F/u in 1 yr    Signed electronically by: Mack Guise, MD  [facility]  [address] [place], [place] [address] Phone: [phone] FAX: [phone]      CC: Elby Beck, FNP (Inactive) No address on file Phone: None  Fax: None   Return to Endocrinology clinic as below: Future Appointments  Date Time Provider [place]  08/13/2020  9:10 AM Karinda Cabriales, Melanie Crazier, MD [facility] None  08/20/2020  8:00 AM Milinda Pointer, MD [facility] None  11/02/2020 10:40 AM Milinda Pointer, MD [facility] None

## 2020-08-14 DIAGNOSIS — L659 Nonscarring hair loss, unspecified: Secondary | ICD-10-CM | POA: Insufficient documentation

## 2020-08-16 LAB — DHEA-SULFATE: DHEA-SO4: <2 ug/dL — ABNORMAL LOW (ref 19–231)

## 2020-08-16 LAB — BASIC METABOLIC PANEL
BUN: 22 mg/dL (ref 7–25)
CO2: 27 mmol/L (ref 20–32)
Calcium: 9.5 mg/dL (ref 8.6–10.2)
Chloride: 105 mmol/L (ref 98–110)
Creat: 0.76 mg/dL (ref 0.50–1.10)
Glucose, Bld: 97 mg/dL (ref 65–99)
Potassium: 3.6 mmol/L (ref 3.5–5.3)
Sodium: 141 mmol/L (ref 135–146)

## 2020-08-16 LAB — TESTOSTERONE, TOTAL, LC/MS/MS: Testosterone, Total, LC-MS-MS: 1 ng/dL — ABNORMAL LOW (ref 2–45)

## 2020-08-16 LAB — PROLACTIN: Prolactin: 8.2 ng/mL

## 2020-08-16 LAB — TSH: TSH: 1.13 mIU/L

## 2020-08-16 LAB — T4, FREE: Free T4: 0.9 ng/dL (ref 0.8–1.8)

## 2020-08-16 LAB — LUTEINIZING HORMONE: LH: 58.7 m[IU]/mL

## 2020-08-16 LAB — FOLLICLE STIMULATING HORMONE: FSH: 122.1 m[IU]/mL — ABNORMAL HIGH

## 2020-08-16 LAB — ESTRADIOL: Estradiol: 18 pg/mL

## 2020-08-20 ENCOUNTER — Ambulatory Visit
Admission: RE | Admit: 2020-08-20 | Discharge: 2020-08-20 | Disposition: A | Discharge status: Home / Self Care | Payer: BC Managed Care – PPO | Source: Ambulatory Visit | Attending: Pain Medicine | Admitting: Pain Medicine

## 2020-08-20 ENCOUNTER — Encounter: Payer: Self-pay | Admitting: Pain Medicine

## 2020-08-20 ENCOUNTER — Other Ambulatory Visit: Payer: Self-pay

## 2020-08-20 ENCOUNTER — Ambulatory Visit (HOSPITAL_BASED_OUTPATIENT_CLINIC_OR_DEPARTMENT_OTHER): Payer: BC Managed Care – PPO | Admitting: Pain Medicine

## 2020-08-20 VITALS — BP 113/71 | HR 80 | Temp 97.0°F | Resp 16 | Ht 63.0 in | Wt 175.0 lb

## 2020-08-20 DIAGNOSIS — G8929 Other chronic pain: Secondary | ICD-10-CM | POA: Insufficient documentation

## 2020-08-20 DIAGNOSIS — M25551 Pain in right hip: Secondary | ICD-10-CM | POA: Diagnosis not present

## 2020-08-20 DIAGNOSIS — M160 Bilateral primary osteoarthritis of hip: Secondary | ICD-10-CM | POA: Insufficient documentation

## 2020-08-20 DIAGNOSIS — M25552 Pain in left hip: Secondary | ICD-10-CM | POA: Diagnosis not present

## 2020-08-20 MED ORDER — ROPIVACAINE HCL 2 MG/ML IJ SOLN
INTRAMUSCULAR | Status: AC
  Filled 2020-08-20: qty 20

## 2020-08-20 MED ORDER — METHYLPREDNISOLONE ACETATE 80 MG/ML IJ SUSP
INTRAMUSCULAR | Status: AC
  Filled 2020-08-20: qty 2

## 2020-08-20 MED ORDER — IOHEXOL 180 MG/ML  SOLN
10.0000 mL | Freq: Once | INTRAMUSCULAR | Status: AC
  Administered 2020-08-20: 10 mL via INTRA_ARTICULAR
  Filled 2020-08-20: qty 20

## 2020-08-20 MED ORDER — MIDAZOLAM HCL 5 MG/5ML IJ SOLN
INTRAMUSCULAR | Status: AC
  Filled 2020-08-20: qty 5

## 2020-08-20 MED ORDER — LIDOCAINE HCL 2 % IJ SOLN
20.0000 mL | Freq: Once | INTRAMUSCULAR | Status: AC
  Administered 2020-08-20: 200 mg
  Filled 2020-08-20: qty 20

## 2020-08-20 MED ORDER — METHYLPREDNISOLONE ACETATE 80 MG/ML IJ SUSP
80.0000 mg | Freq: Once | INTRAMUSCULAR | Status: AC
  Administered 2020-08-20: 80 mg

## 2020-08-20 MED ORDER — LACTATED RINGERS IV SOLN
1000.0000 mL | Freq: Once | INTRAVENOUS | Status: AC
  Administered 2020-08-20: 1000 mL via INTRAVENOUS

## 2020-08-20 MED ORDER — FENTANYL CITRATE (PF) 100 MCG/2ML IJ SOLN
INTRAMUSCULAR | Status: AC
  Filled 2020-08-20: qty 2

## 2020-08-20 MED ORDER — METHYLPREDNISOLONE ACETATE 80 MG/ML IJ SUSP
80.0000 mg | Freq: Once | INTRAMUSCULAR | Status: AC
  Administered 2020-08-20: 80 mg via INTRA_ARTICULAR

## 2020-08-20 MED ORDER — ROPIVACAINE HCL 2 MG/ML IJ SOLN
9.0000 mL | Freq: Once | INTRAMUSCULAR | Status: AC
  Administered 2020-08-20: 9 mL via INTRA_ARTICULAR

## 2020-08-20 MED ORDER — FENTANYL CITRATE (PF) 100 MCG/2ML IJ SOLN
25.0000 ug | INTRAMUSCULAR | Status: DC | PRN
  Administered 2020-08-20: 50 ug via INTRAVENOUS

## 2020-08-20 MED ORDER — LIDOCAINE HCL (PF) 2 % IJ SOLN
INTRAMUSCULAR | Status: AC
  Filled 2020-08-20: qty 5

## 2020-08-20 MED ORDER — MIDAZOLAM HCL 5 MG/5ML IJ SOLN
1.0000 mg | INTRAMUSCULAR | Status: DC | PRN
  Administered 2020-08-20: 1 mg via INTRAVENOUS
  Administered 2020-08-20: 2 mg via INTRAVENOUS

## 2020-08-20 MED ORDER — ROPIVACAINE HCL 2 MG/ML IJ SOLN
9.0000 mL | Freq: Once | INTRAMUSCULAR | Status: AC
  Administered 2020-08-20: 9 mL

## 2020-08-20 NOTE — Patient Instructions (Signed)

## 2020-08-20 NOTE — Progress Notes (Signed)
PROVIDER NOTE: Information contained herein reflects review and annotations entered in association with encounter. Interpretation of such information and data should be left to medically-trained personnel. Information provided to patient can be located elsewhere in the medical record under "Patient Instructions". Document created using STT-dictation technology, any transcriptional errors that may result from process are unintentional.    Patient: Denise Burns  Service Category: Procedure  Provider: [name], MD  DOB: [birth year]  DOS: 08/20/2020  Location: [facility]  MRN: [id number]  Setting: Ambulatory - outpatient  Referring Provider: No ref. provider found  Type: Established Patient  Specialty: Interventional Pain Management  PCP: Elby Beck, FNP (Inactive)   Primary Reason for Visit: Interventional Pain Management Treatment. CC: Hip Pain (bilateral) and Back Pain  Procedure:          Anesthesia, Analgesia, Anxiolysis:  Type: Intra-Articular Hip Injection #1  Primary Purpose: Diagnostic Region: Posterolateral hip joint area. Level: Lower pelvic and hip joint level. Target Area: Superior aspect of the hip joint cavity, going thru the superior portion of the capsular ligament. Approach: Posterolateral approach. Laterality: Bilateral  Type: Moderate (Conscious) Sedation combined with Local Anesthesia Indication(s): Analgesia and Anxiety Route: Intravenous (IV) IV Access: Secured Sedation: Meaningful verbal contact was maintained at all times during the procedure  Local Anesthetic: Lidocaine 1-2%  Position: Prone Prepped Area: Entire Posterolateral hip area. DuraPrep (Iodine Povacrylex [0.7% available iodine] and Isopropyl Alcohol, 74% w/w)   Indications: 1. Chronic hip pain (Bilateral)   2. Primary osteoarthritis of both hips    Pain Score: Pre-procedure: 5 /10 Post-procedure: 1 /10   Pre-op H&P Assessment:  Denise Burns is a 50 y.o. (year old),  female patient, seen today for interventional treatment. She  has a past surgical history that includes Cholecystectomy and Polypectomy. Denise Burns has a current medication list which includes the following prescription(s): fludrocortisone, hydrocodone-acetaminophen, [START ON 09/08/2020] hydrocodone-acetaminophen, [START ON 10/08/2020] hydrocodone-acetaminophen, hydrocortisone, meloxicam, pseudoephedrine, and tizanidine, and the following Facility-Administered Medications: fentanyl and midazolam. Her primarily concern today is the Hip Pain (bilateral) and Back Pain  Initial Vital Signs:  Pulse/HCG Rate: 91ECG Heart Rate: 80 Temp: (!) 97.2 F (36.2 C) Resp: 16 BP: 130/77 SpO2: 100 %  BMI: Estimated body mass index is 31 kg/m as calculated from the following:   Height as of this encounter: 5\' 3"  (1.6 m).   Weight as of this encounter: 175 lb (79.4 kg).  Risk Assessment: Allergies: Reviewed. She is allergic to lisinopril, ultram [tramadol], and sulfa antibiotics.  Allergy Precautions: None required Coagulopathies: Reviewed. None identified.  Blood-thinner therapy: None at this time Active Infection(s): Reviewed. None identified. Denise Burns is afebrile  Site Confirmation: Denise Burns was asked to confirm the procedure and laterality before marking the site Procedure checklist: Completed Consent: Before the procedure and under the influence of no sedative(s), amnesic(s), or anxiolytics, the patient was informed of the treatment options, risks and possible complications. To fulfill our ethical and legal obligations, as recommended by the American Medical Association's Code of Ethics, I have informed the patient of my clinical impression; the nature and purpose of the treatment or procedure; the risks, benefits, and possible complications of the intervention; the alternatives, including doing nothing; the risk(s) and benefit(s) of the alternative treatment(s) or procedure(s); and the risk(s) and  benefit(s) of doing nothing. The patient was provided information about the general risks and possible complications associated with the procedure. These may include, but are not limited to: failure to achieve desired goals, infection, bleeding,  organ or nerve damage, allergic reactions, paralysis, and death. In addition, the patient was informed of those risks and complications associated to the procedure, such as failure to decrease pain; infection; bleeding; organ or nerve damage with subsequent damage to sensory, motor, and/or autonomic systems, resulting in permanent pain, numbness, and/or weakness of one or several areas of the body; allergic reactions; (i.e.: anaphylactic reaction); and/or death. Furthermore, the patient was informed of those risks and complications associated with the medications. These include, but are not limited to: allergic reactions (i.e.: anaphylactic or anaphylactoid reaction(s)); adrenal axis suppression; blood sugar elevation that in diabetics may result in ketoacidosis or comma; water retention that in patients with history of congestive heart failure may result in shortness of breath, pulmonary edema, and decompensation with resultant heart failure; weight gain; swelling or edema; medication-induced neural toxicity; particulate matter embolism and blood vessel occlusion with resultant organ, and/or nervous system infarction; and/or aseptic necrosis of one or more joints. Finally, the patient was informed that Medicine is not an exact science; therefore, there is also the possibility of unforeseen or unpredictable risks and/or possible complications that may result in a catastrophic outcome. The patient indicated having understood very clearly. We have given the patient no guarantees and we have made no promises. Enough time was given to the patient to ask questions, all of which were answered to the patient's satisfaction. Denise Burns has indicated that she wanted to continue  with the procedure. Attestation: I, the ordering provider, attest that I have discussed with the patient the benefits, risks, side-effects, alternatives, likelihood of achieving goals, and potential problems during recovery for the procedure that I have provided informed consent. Date  Time: 08/20/2020  8:02 AM  Pre-Procedure Preparation:  Monitoring: As per clinic protocol. Respiration, ETCO2, SpO2, BP, heart rate and rhythm monitor placed and checked for adequate function Safety Precautions: Patient was assessed for positional comfort and pressure points before starting the procedure. Time-out: I initiated and conducted the "Time-out" before starting the procedure, as per protocol. The patient was asked to participate by confirming the accuracy of the "Time Out" information. Verification of the correct person, site, and procedure were performed and confirmed by me, the nursing staff, and the patient. "Time-out" conducted as per Joint Commission's Universal Protocol (UP.01.01.01). Time: 0826  Description of Procedure:          Safety Precautions: Aspiration looking for blood return was conducted prior to all injections. At no point did we inject any substances, as a needle was being advanced. No attempts were made at seeking any paresthesias. Safe injection practices and needle disposal techniques used. Medications properly checked for expiration dates. SDV (single dose vial) medications used. Description of the Procedure: Protocol guidelines were followed. The patient was placed in position over the fluoroscopy table. The target area was identified and the area prepped in the usual manner. Skin & deeper tissues infiltrated with local anesthetic. Appropriate amount of time allowed to pass for local anesthetics to take effect. The procedure needles were then advanced to the target area. Proper needle placement secured. Negative aspiration confirmed. Solution injected in intermittent fashion, asking for  systemic symptoms every 0.5cc of injectate. The needles were then removed and the area cleansed, making sure to leave some of the prepping solution back to take advantage of its long term bactericidal properties. Vitals:   08/20/20 0837 08/20/20 0847 08/20/20 0857 08/20/20 0907  BP: 120/82 109/74 112/71 113/71  Pulse: 86 80    Resp: 16 19 17  16  Temp:  98 F (36.7 C)  (!) 97 F (36.1 C)  TempSrc:  Temporal  Temporal  SpO2: 97% 96% 96% 96%  Weight:      Height:        Start Time: 0826 hrs. End Time: 0837 hrs. Materials:  Needle(s) Type: Spinal Needle Gauge: 22G Length: 7-in Medication(s): Please see orders for medications and dosing details.  Imaging Guidance (Non-Spinal):          Type of Imaging Technique: Fluoroscopy Guidance (Non-Spinal) Indication(s): Assistance in needle guidance and placement for procedures requiring needle placement in or near specific anatomical locations not easily accessible without such assistance. Exposure Time: Please see nurses notes. Contrast: Before injecting any contrast, we confirmed that the patient did not have an allergy to iodine, shellfish, or radiological contrast. Once satisfactory needle placement was completed at the desired level, radiological contrast was injected. Contrast injected under live fluoroscopy. No contrast complications. See chart for type and volume of contrast used. Fluoroscopic Guidance: I was personally present during the use of fluoroscopy. "Tunnel Vision Technique" used to obtain the best possible view of the target area. Parallax error corrected before commencing the procedure. "Direction-depth-direction" technique used to introduce the needle under continuous pulsed fluoroscopy. Once target was reached, antero-posterior, oblique, and lateral fluoroscopic projection used confirm needle placement in all planes. Images permanently stored in EMR. Interpretation: I personally interpreted the imaging intraoperatively. Adequate  needle placement confirmed in multiple planes. Appropriate spread of contrast into desired area was observed. No evidence of afferent or efferent intravascular uptake. Permanent images saved into the patient's record.  Antibiotic Prophylaxis:   Anti-infectives (From admission, onward)   None     Indication(s): None identified  Post-operative Assessment:  Post-procedure Vital Signs:  Pulse/HCG Rate: 8081 Temp: (!) 97 F (36.1 C) Resp: 16 BP: 113/71 SpO2: 96 %  EBL: None  Complications: No immediate post-treatment complications observed by team, or reported by patient.  Note: The patient tolerated the entire procedure well. A repeat set of vitals were taken after the procedure and the patient was kept under observation following institutional policy, for this type of procedure. Post-procedural neurological assessment was performed, showing return to baseline, prior to discharge. The patient was provided with post-procedure discharge instructions, including a section on how to identify potential problems. Should any problems arise concerning this procedure, the patient was given instructions to immediately contact us, at any time, without hesitation. In any case, we plan to contact the patient by telephone for a follow-up status report regarding this interventional procedure.  Comments:  No additional relevant information.  Plan of Care  Orders:  Orders Placed This Encounter  Procedures  . HIP INJECTION    Scheduling Instructions:     Side: Bilateral     Sedation: Patient's choice.     Timeframe: Today  . [facility] C-ARM 1-60 MIN NO REPORT    Intraoperative interpretation by procedural physician at [facility].    Standing Status:   Standing    Number of Occurrences:   1    Order Specific Question:   Reason for exam:    Answer:   Assistance in needle guidance and placement for procedures requiring needle placement in or near specific anatomical locations not  easily accessible without such assistance.  . Informed Consent Details: Physician/Practitioner Attestation; Transcribe to consent form and obtain patient signature    Nursing Order: Transcribe to consent form and obtain patient signature. Note: Always confirm laterality of pain with [name],  before procedure.    Order Specific Question:   Physician/Practitioner attestation of informed consent for procedure/surgical case    Answer:   I, the physician/practitioner, attest that I have discussed with the patient the benefits, risks, side effects, alternatives, likelihood of achieving goals and potential problems during recovery for the procedure that I have provided informed consent.    Order Specific Question:   Procedure    Answer:   Hip injection    Order Specific Question:   Physician/Practitioner performing the procedure    Answer:   Pharell Rolfson A. Dossie Arbour, MD    Order Specific Question:   Indication/Reason    Answer:   Hip Joint Pain (Arthralgia)  . Provide equipment / supplies at bedside    "Block Tray" (Disposable  single use) Needle type: SpinalSpinal Amount/quantity: 2 Size: Long (7-inch) Gauge: 22G    Standing Status:   Standing    Number of Occurrences:   1    Order Specific Question:   Specify    Answer:   Block Tray   Chronic Opioid Analgesic:  Hydrocodone/APAP 7.5/325 1 tablet p.o. 3 times daily (22.5 mg/day of hydrocodone)  MME/day:22.5mg /day.   Medications ordered for procedure: Meds ordered this encounter  Medications  . iohexol (OMNIPAQUE) 180 MG/ML injection 10 mL    Must be Myelogram-compatible. If not available, you may substitute with a water-soluble, non-ionic, hypoallergenic, myelogram-compatible radiological contrast medium.  Marland Kitchen lidocaine (XYLOCAINE) 2 % (with pres) injection 400 mg  . lactated ringers infusion 1,000 mL  . midazolam (VERSED) 5 MG/5ML injection 1-2 mg    Make sure Flumazenil is available in the pyxis when using this medication. If  oversedation occurs, administer 0.2 mg IV over 15 sec. If after 45 sec no response, administer 0.2 mg again over 1 min; may repeat at 1 min intervals; not to exceed 4 doses (1 mg)  . fentaNYL (SUBLIMAZE) injection 25-50 mcg    Make sure Narcan is available in the pyxis when using this medication. In the event of respiratory depression (RR< 8/min): Titrate NARCAN (naloxone) in increments of 0.1 to 0.2 mg IV at 2-3 minute intervals, until desired degree of reversal.  . methylPREDNISolone acetate (DEPO-MEDROL) injection 80 mg  . ropivacaine (PF) 2 mg/mL (0.2%) (NAROPIN) injection 9 mL  . methylPREDNISolone acetate (DEPO-MEDROL) injection 80 mg  . ropivacaine (PF) 2 mg/mL (0.2%) (NAROPIN) injection 9 mL   Medications administered: We administered iohexol, lidocaine, lactated ringers, midazolam, fentaNYL, methylPREDNISolone acetate, ropivacaine (PF) 2 mg/mL (0.2%), methylPREDNISolone acetate, and ropivacaine (PF) 2 mg/mL (0.2%).  See the medical record for exact dosing, route, and time of administration.  Follow-up plan:   Return in about 2 weeks (around 09/03/2020) for (F2F), (PP) Follow-up.       Considering: Therapeutic bilateral lumbar facetRFA #1  [place] joint Blk PossiblebilateralSI joint RFA Diagnostic bilateral L5 TFESI Diagnostic left-sided L2 TFESI   PRN Procedures: Palliative bilateral lumbar facet block #3 Palliative right lumbar facet RFA #2 (last done 01/07/2020) Palliative left lumbar facet RFA #2 (last done 02/04/2020)        Recent Visits Date Type Provider Dept  08/03/20 Office Visit Milinda Pointer, MD [facility]  Showing recent visits within past 90 days and meeting all other requirements Today's Visits Date Type Provider Dept  08/20/20 Procedure visit Milinda Pointer, MD [facility]  Showing today's visits and meeting all other requirements Future Appointments Date Type Provider Dept  08/31/20 Appointment Milinda Pointer, [facility]  11/02/20 Appointment Milinda Pointer,  MD [facility]  Showing future appointments within next 90 days and meeting all other requirements  Disposition: Discharge home  Discharge (Date  Time): 08/20/2020; 0915 hrs.   Primary Care Physician: Elby Beck, FNP (Inactive) Location: [facility] Note by: [name], MD Date: 08/20/2020; Time: 9:30 AM  Disclaimer:  Medicine is not an Chief Strategy Officer. The only guarantee in medicine is that nothing is guaranteed. It is important to note that the decision to proceed with this intervention was based on the information collected from the patient. The Data and conclusions were drawn from the patient's questionnaire, the interview, and the physical examination. Because the information was provided in large part by the patient, it cannot be guaranteed that it has not been purposely or unconsciously manipulated. Every effort has been made to obtain as much relevant data as possible for this evaluation. It is important to note that the conclusions that lead to this procedure are derived in large part from the available data. Always take into account that the treatment will also be dependent on availability of resources and existing treatment guidelines, considered by other Pain Management Practitioners as being common knowledge and practice, at the time of the intervention. For Medico-Legal purposes, it is also important to point out that variation in procedural techniques and pharmacological choices are the acceptable norm. The indications, contraindications, technique, and results of the above procedure should only be interpreted and judged by a Board-Certified Interventional Pain Specialist with extensive familiarity and expertise in the same exact procedure and technique.

## 2020-08-21 ENCOUNTER — Telehealth: Payer: Self-pay

## 2020-08-21 NOTE — Telephone Encounter (Signed)
Post procedure phone call.  Patient states she is feeling good.

## 2020-08-25 ENCOUNTER — Other Ambulatory Visit: Payer: Self-pay | Admitting: Allergy and Immunology

## 2020-08-25 DIAGNOSIS — J342 Deviated nasal septum: Secondary | ICD-10-CM | POA: Diagnosis not present

## 2020-08-25 DIAGNOSIS — H1045 Other chronic allergic conjunctivitis: Secondary | ICD-10-CM | POA: Diagnosis not present

## 2020-08-25 DIAGNOSIS — J3081 Allergic rhinitis due to animal (cat) (dog) hair and dander: Secondary | ICD-10-CM | POA: Diagnosis not present

## 2020-08-25 DIAGNOSIS — J3089 Other allergic rhinitis: Secondary | ICD-10-CM | POA: Diagnosis not present

## 2020-08-31 ENCOUNTER — Other Ambulatory Visit: Payer: Self-pay

## 2020-08-31 ENCOUNTER — Ambulatory Visit: Payer: BC Managed Care – PPO | Attending: Pain Medicine | Admitting: Pain Medicine

## 2020-08-31 ENCOUNTER — Encounter: Payer: Self-pay | Admitting: Pain Medicine

## 2020-08-31 VITALS — BP 127/85 | HR 94 | Temp 97.3°F | Resp 18 | Ht 63.0 in | Wt 175.0 lb

## 2020-08-31 DIAGNOSIS — J3081 Allergic rhinitis due to animal (cat) (dog) hair and dander: Secondary | ICD-10-CM | POA: Insufficient documentation

## 2020-08-31 DIAGNOSIS — M25552 Pain in left hip: Secondary | ICD-10-CM | POA: Diagnosis not present

## 2020-08-31 DIAGNOSIS — M47816 Spondylosis without myelopathy or radiculopathy, lumbar region: Secondary | ICD-10-CM | POA: Diagnosis not present

## 2020-08-31 DIAGNOSIS — M545 Low back pain, unspecified: Secondary | ICD-10-CM

## 2020-08-31 DIAGNOSIS — M1611 Unilateral primary osteoarthritis, right hip: Secondary | ICD-10-CM

## 2020-08-31 DIAGNOSIS — M7062 Trochanteric bursitis, left hip: Secondary | ICD-10-CM

## 2020-08-31 DIAGNOSIS — M5416 Radiculopathy, lumbar region: Secondary | ICD-10-CM | POA: Diagnosis not present

## 2020-08-31 DIAGNOSIS — J309 Allergic rhinitis, unspecified: Secondary | ICD-10-CM | POA: Insufficient documentation

## 2020-08-31 DIAGNOSIS — M76892 Other specified enthesopathies of left lower limb, excluding foot: Secondary | ICD-10-CM

## 2020-08-31 DIAGNOSIS — G894 Chronic pain syndrome: Secondary | ICD-10-CM | POA: Insufficient documentation

## 2020-08-31 DIAGNOSIS — R0602 Shortness of breath: Secondary | ICD-10-CM | POA: Insufficient documentation

## 2020-08-31 DIAGNOSIS — M7061 Trochanteric bursitis, right hip: Secondary | ICD-10-CM

## 2020-08-31 DIAGNOSIS — R1031 Right lower quadrant pain: Secondary | ICD-10-CM | POA: Diagnosis not present

## 2020-08-31 DIAGNOSIS — M25551 Pain in right hip: Secondary | ICD-10-CM | POA: Insufficient documentation

## 2020-08-31 DIAGNOSIS — H1045 Other chronic allergic conjunctivitis: Secondary | ICD-10-CM | POA: Insufficient documentation

## 2020-08-31 DIAGNOSIS — M5137 Other intervertebral disc degeneration, lumbosacral region: Secondary | ICD-10-CM | POA: Diagnosis not present

## 2020-08-31 DIAGNOSIS — M4300 Spondylolysis, site unspecified: Secondary | ICD-10-CM | POA: Diagnosis not present

## 2020-08-31 DIAGNOSIS — G8929 Other chronic pain: Secondary | ICD-10-CM | POA: Diagnosis not present

## 2020-08-31 DIAGNOSIS — M160 Bilateral primary osteoarthritis of hip: Secondary | ICD-10-CM | POA: Diagnosis not present

## 2020-08-31 DIAGNOSIS — M4310 Spondylolisthesis, site unspecified: Secondary | ICD-10-CM | POA: Diagnosis not present

## 2020-08-31 DIAGNOSIS — J342 Deviated nasal septum: Secondary | ICD-10-CM | POA: Insufficient documentation

## 2020-08-31 NOTE — Progress Notes (Signed)
PROVIDER NOTE: Information contained herein reflects review and annotations entered in association with encounter. Interpretation of such information and data should be left to medically-trained personnel. Information provided to patient can be located elsewhere in the medical record under "Patient Instructions". Document created using STT-dictation technology, any transcriptional errors that may result from process are unintentional.    Patient: Denise Burns  Service Category: E/M  Provider: [name], MD  DOB: [birth year]  DOS: 08/31/2020  Specialty: Interventional Pain Management  MRN: [id number]  Setting: Ambulatory outpatient  PCP: Elby Beck, FNP (Inactive)  Type: Established Patient    Referring Provider: No ref. provider found  Location: Office  Delivery: Face-to-face     HPI  [name], a 50 y.o. year old female, is here today because of her Chronic pain syndrome [G89.4]. [name]'s primary complain today is Hip Pain and Back Pain (upper) Last encounter: My last encounter with her was on 08/20/2020. Pertinent problems: [name] has Chronic midline low back pain with bilateral sciatica; Chronic low back pain (1ry area of Pain) (Bilateral) (R>L) w/o sciatica; Chronic sacroiliac joint pain (Secondary Area of Pain) (Right); Chronic pain syndrome; Abnormal MRI, lumbar spine (03/22/2018); Lumbar facet hypertrophy (Multilevel) (Bilateral); Lumbar facet syndrome (Bilateral); DDD (degenerative disc disease), lumbosacral; Spondylolisthesis (8 mm) at L5-S1 level; Lumbar Grade 1 Retrolisthesis (2 mm) of L2/L3; L5-S1 pars defect w/ spondylolisthesis (Bilateral); Lumbar pars defect (L5-S1) (Bilateral); Lumbar foraminal stenosis (Left: L2-3) (Bilateral: L5-S1); Osteoarthritis of sacroiliac joint (Bilateral); Chronic musculoskeletal pain; Neurogenic pain; Osteoarthritis of facet joint of lumbar spine; Greater trochanteric bursitis (Bilateral); Lumbar spondylosis; Spondylosis without  myelopathy or radiculopathy, lumbosacral region; Acute postoperative pain; Chronic hip pain (Right); Osteoarthritis of hip (Right); Lumbar radiculitis (L1/L2) (Right); Chronic groin pain (Right); Chronic hip pain (Bilateral); Enthesopathy hip region (Left); and Primary osteoarthritis of both hips on their pertinent problem list. Pain Assessment: Severity of Chronic pain is reported as a 1 /10. Location:   Right,Left/denies. Onset: More than a month ago. Quality: Tender. Timing: Constant. Modifying factor(s): procedure. Vitals:  height is 5' 3" (1.6 m) and weight is 175 lb (79.4 kg). Her temperature is 97.3 F (36.3 C) (abnormal). Her blood pressure is 127/85 and her pulse is 94. Her respiration is 18 and oxygen saturation is 99%.   Reason for encounter: post-procedure assessment.  The patient indicates that the hip pain is completely gone.  However, she is having pain in the mid back, at around the level of the L2-3.  Hyperextension on rotation maneuver did reproduce the patient's low back pain.  Previously we had done radiofrequency ablations of her lower lumbar facets, but she has a spondylolisthesis of L2 over L3, which is likely to be responsible for the patient's current symptoms.  Because of this, I will be bringing her back for a diagnostic bilateral lumbar facet block of the L2-3 level.  RTCB: 11/07/2020  Post-Procedure Evaluation  Procedure (08/20/2020): Diagnostic bilateral IA hip injection #1 under fluoroscopic guidance and IV sedation Pre-procedure pain level: 5/10 Post-procedure: 1/10 (> 50% relief)  Sedation: Sedation provided.  Effectiveness during initial hour after procedure(Ultra-Short Term Relief): 100 %.  Local anesthetic used: Long-acting (4-6 hours) Effectiveness: Defined as any analgesic benefit obtained secondary to the administration of local anesthetics. This carries significant diagnostic value as to the etiological location, or anatomical origin, of the pain. Duration of  benefit is expected to coincide with the duration of the local anesthetic used.  Effectiveness during initial 4-6 hours after procedure(Short-Term Relief): 100 %.  Long-term benefit: Defined as any relief past the pharmacologic duration of the local anesthetics.  Effectiveness past the initial 6 hours after procedure(Long-Term Relief): 80 %.  Current benefits: Defined as benefit that persist at this time.   Analgesia:  80% ongoing relief of the bilateral hip joint pain.  However, the patient presents today with complaints of upper back pain (different problem). Function: [name] reports improvement in function ROM: [name] reports improvement in ROM  Pharmacotherapy Assessment   Analgesic: Hydrocodone/APAP 7.5/325 1 tablet p.o. 3 times daily (22.5 mg/day of hydrocodone)  MME/day:22.24m/day.   Monitoring: [place] PMP: PDMP reviewed during this encounter.       Pharmacotherapy: No side-effects or adverse reactions reported. Compliance: No problems identified. Effectiveness: Clinically acceptable.  TDewayne Shorter RN  08/31/2020  8:12 AM  Signed Safety precautions to be maintained throughout the outpatient stay will include: orient to surroundings, keep bed in low position, maintain call bell within reach at all times, provide assistance with transfer out of bed and ambulation.    UDS:  Summary  Date Value Ref Range Status  02/11/2020 Note  Final    Comment:    ==================================================================== [employer] [facility] (MW) ==================================================================== Test                             Result       Flag       Units  Drug Present not Declared for Prescription Verification   Alprazolam                     23           UNEXPECTED ng/mg creat   Alpha-hydroxyalprazolam        16           UNEXPECTED ng/mg creat    Source of alprazolam is a scheduled prescription medication. Alpha-    hydroxyalprazolam is an  expected metabolite of alprazolam.    Oxycodone                      2136         UNEXPECTED ng/mg creat   Oxymorphone                    148          UNEXPECTED ng/mg creat   Noroxycodone                   5926         UNEXPECTED ng/mg creat   Noroxymorphone                 324          UNEXPECTED ng/mg creat    Sources of oxycodone are scheduled prescription medications.    Oxymorphone, noroxycodone, and noroxymorphone are expected    metabolites of oxycodone. Oxymorphone is also available as a    scheduled prescription medication.    Butalbital                     PRESENT      UNEXPECTED  Drug Absent but Declared for Prescription Verification   Hydrocodone                    Not Detected UNEXPECTED ng/mg creat ==================================================================== Test  Result    Flag   Units      Ref Range   Creatinine              146              mg/dL      >=20 ==================================================================== Declared Medications:  The flagging and interpretation on this report are based on the  following declared medications.  Unexpected results may arise from  inaccuracies in the declared medications.   **Note: The testing scope of this panel includes these medications:   Hydrocodone   **Note: The testing scope of this panel does not include the  following reported medications:   Acetaminophen  Baclofen  Cetirizine  Fludrocortisone  Hydrocortisone  Meloxicam  Pseudoephedrine (Sudafed) ==================================================================== For clinical consultation, please call [phone]. ====================================================================      ROS  Constitutional: Denies any fever or chills Gastrointestinal: No reported hemesis, hematochezia, vomiting, or acute GI distress Musculoskeletal: Denies any acute onset joint swelling, redness, loss of ROM, or  weakness Neurological: No reported episodes of acute onset apraxia, aphasia, dysarthria, agnosia, amnesia, paralysis, loss of coordination, or loss of consciousness  Medication Review  HYDROcodone-acetaminophen, azelastine, fludrocortisone, hydrocortisone, levocetirizine, meloxicam, pseudoephedrine, and tiZANidine  History Review  Allergy: [name] is allergic to lisinopril, ultram [tramadol], and sulfa antibiotics. Drug: [name]  reports no history of drug use. Alcohol:  reports current alcohol use. Tobacco:  reports that she has quit smoking. She has never used smokeless tobacco. Social: [name]  reports that she has quit smoking. She has never used smokeless tobacco. She reports current alcohol use. She reports that she does not use drugs. Medical:  has a past medical history of Addison disease ([place]), Arthritis, Back pain, and GERD (gastroesophageal reflux disease). Surgical: [name]  has a past surgical history that includes Cholecystectomy and Polypectomy. Family: family history includes Alcohol abuse in her father; Arthritis in her mother; Hearing loss in her father; Hyperlipidemia in her father and mother; Hypertension in her mother.  Laboratory Chemistry Profile   Renal Lab Results  Component Value Date   BUN 22 08/13/2020   CREATININE 0.76 17/40/8144   BCR NOT APPLICABLE 81/85/6314   GFR 72.11 08/13/2019   GFRAA 86 08/16/2018   GFRNONAA 74 08/16/2018     Hepatic Lab Results  Component Value Date   AST 18 08/16/2018   ALBUMIN 4.4 08/16/2018   ALKPHOS 64 08/16/2018     Electrolytes Lab Results  Component Value Date   NA 141 08/13/2020   K 3.6 08/13/2020   CL 105 08/13/2020   CALCIUM 9.5 08/13/2020   MG 2.1 08/16/2018     Bone Lab Results  Component Value Date   VD25OH 13.55 (L) 04/30/2018   25OHVITD1 67 08/16/2018   25OHVITD2 <1.0 08/16/2018   25OHVITD3 67 08/16/2018     Inflammation (CRP: Acute Phase) (ESR: Chronic Phase) Lab Results   Component Value Date   CRP 2 08/16/2018   ESRSEDRATE 37 (H) 08/16/2018       Note: Above Lab results reviewed.  Recent Imaging Review  DG PAIN CLINIC C-ARM 1-60 MIN NO REPORT Fluoro was used, but no Radiologist interpretation will be provided.  Please refer to "NOTES" tab for provider progress note. Note: Reviewed        Physical Exam  General appearance: Well nourished, well developed, and well hydrated. In no apparent acute distress Mental status: Alert, oriented x 3 (person, place, & time)       Respiratory:  No evidence of acute respiratory distress Eyes: PERLA Vitals: BP 127/85   Pulse 94   Temp (!) 97.3 F (36.3 C)   Resp 18   Ht 5' 3" (1.6 m)   Wt 175 lb (79.4 kg)   LMP 06/08/2018 (Exact Date)   SpO2 99%   BMI 31.00 kg/m  BMI: Estimated body mass index is 31 kg/m as calculated from the following:   Height as of this encounter: 5' 3" (1.6 m).   Weight as of this encounter: 175 lb (79.4 kg). Ideal: Ideal body weight: 52.4 kg (115 lb 8.3 oz) Adjusted ideal body weight: 63.2 kg (139 lb 5 oz)  Assessment   Status Diagnosis  Controlled Improved Improved 1. Chronic pain syndrome   2. Chronic hip pain (Bilateral)   3. Greater trochanteric bursitis (Bilateral)   4. Lumbar radiculitis (L1/L2) (Right)   5. Chronic hip pain (Right)   6. Primary osteoarthritis of both hips   7. Osteoarthritis of hip (Right)   8. Chronic groin pain (Right)   9. Enthesopathy hip region (Left)   10. Chronic low back pain (Primary Area of Pain) (Bilateral) (R>L) w/o sciatica   11. DDD (degenerative disc disease), lumbosacral   12. L5-S1 pars defect w/ spondylolisthesis (Bilateral)   13. Lumbar facet syndrome (Bilateral)   14. Lumbar facet hypertrophy (Multilevel) (Bilateral)   15. Lumbar Grade 1 Retrolisthesis (2 mm) of L2/L3      Updated Problems: Problem  Chronic low back pain (1ry area of Pain) (Bilateral) (R>L) w/o sciatica  Shortness of Breath  Allergic Rhinitis  Allergic  Rhinitis Due to Animal (Cat) (Dog) Hair and Dander  Chronic Allergic Conjunctivitis  Deviated Nasal Septum  Hair Loss    Plan of Care  Problem-specific:  No problem-specific Assessment & Plan notes found for this encounter.  [name] has a current medication list which includes the following long-term medication(s): azelastine, hydrocodone-acetaminophen, [START ON 09/08/2020] hydrocodone-acetaminophen, [START ON 10/08/2020] hydrocodone-acetaminophen, levocetirizine, and meloxicam.  Pharmacotherapy (Medications Ordered): No orders of the defined types were placed in this encounter.  Orders:  Orders Placed This Encounter  Procedures  . LUMBAR FACET(MEDIAL BRANCH NERVE BLOCK) MBNB    Standing Status:   Future    Standing Expiration Date:   09/28/2020    Scheduling Instructions:     Procedure: Lumbar facet block (AKA.: Lumbosacral medial branch nerve block)     Side: Bilateral     Level: L2-3 Facet (L1, L2, L3 Medial Branch Nerves)     Sedation: Patient's choice.     Timeframe: ASAA    Order Specific Question:   Where will this procedure be performed?    Answer:   [facility]   Follow-up plan:   Return for Procedure (w/ sedation): (B) L-FCT BLK (L2-3).      Considering: Diagnostic L2-3 L-FCT BLK #1  [place] joint Blk PossiblebilateralSI joint RFA Diagnostic bilateral L5 TFESI Diagnostic left-sided L2 TFESI   PRN Procedures: Palliative bilateral lumbar facet block #3 Palliative right lumbar facet RFA #2 (last done 01/07/2020) Palliative left lumbar facet RFA #2 (last done 02/04/2020)         Recent Visits Date Type Provider Dept  08/20/20 Procedure visit Milinda Pointer, [facility]  08/03/20 Office Visit Milinda Pointer, MD [facility]  Showing recent visits within past 90 days and meeting all other requirements Today's Visits Date Type Provider Dept  08/31/20 Office Visit Milinda Pointer, MD  [facility]  Showing today's visits and  meeting all other requirements Future Appointments Date Type Provider Dept  09/08/20 Appointment Milinda Pointer, MD [facility]  11/02/20 Appointment Milinda Pointer, MD [facility]  Showing future appointments within next 90 days and meeting all other requirements  I discussed the assessment and treatment plan with the patient. The patient was provided an opportunity to ask questions and all were answered. The patient agreed with the plan and demonstrated an understanding of the instructions.  Patient advised to call back or seek an in-person evaluation if the symptoms or condition worsens.  Duration of encounter: 30 minutes.  Note by: [name], MD Date: 08/31/2020; Time: 12:19 PM

## 2020-08-31 NOTE — Progress Notes (Signed)
Safety precautions to be maintained throughout the outpatient stay will include: orient to surroundings, keep bed in low position, maintain call bell within reach at all times, provide assistance with transfer out of bed and ambulation.  

## 2020-08-31 NOTE — Patient Instructions (Addendum)
____________________________________________________________________________________________  Preparing for Procedure with Sedation  Procedure appointments are limited to planned procedures: . No Prescription Refills. . No disability issues will be discussed. . No medication changes will be discussed.  Instructions: . Oral Intake: Do not eat or drink anything for at least 8 hours prior to your procedure. (Exception: Blood Pressure Medication. See below.) . Transportation: Unless otherwise stated by your physician, you may drive yourself after the procedure. . Blood Pressure Medicine: Do not forget to take your blood pressure medicine with a sip of water the morning of the procedure. If your Diastolic (lower reading)is above 100 mmHg, elective cases will be cancelled/rescheduled. . Blood thinners: These will need to be stopped for procedures. Notify our staff if you are taking any blood thinners. Depending on which one you take, there will be specific instructions on how and when to stop it. . Diabetics on insulin: Notify the staff so that you can be scheduled 1st case in the morning. If your diabetes requires high dose insulin, take only  of your normal insulin dose the morning of the procedure and notify the staff that you have done so. . Preventing infections: Shower with an antibacterial soap the morning of your procedure. . Build-up your immune system: Take 1000 mg of Vitamin C with every meal (3 times a day) the day prior to your procedure. . Antibiotics: Inform the staff if you have a condition or reason that requires you to take antibiotics before dental procedures. . Pregnancy: If you are pregnant, call and cancel the procedure. . Sickness: If you have a cold, fever, or any active infections, call and cancel the procedure. . Arrival: You must be in the facility at least 30 minutes prior to your scheduled procedure. . Children: Do not bring children with you. . Dress appropriately:  Bring dark clothing that you would not mind if they get stained. . Valuables: Do not bring any jewelry or valuables.  Reasons to call and reschedule or cancel your procedure: (Following these recommendations will minimize the risk of a serious complication.) . Surgeries: Avoid having procedures within 2 weeks of any surgery. (Avoid for 2 weeks before or after any surgery). . Flu Shots: Avoid having procedures within 2 weeks of a flu shots or . (Avoid for 2 weeks before or after immunizations). . Barium: Avoid having a procedure within 7-10 days after having had a radiological study involving the use of radiological contrast. (Myelograms, Barium swallow or enema study). . Heart attacks: Avoid any elective procedures or surgeries for the initial 6 months after a "Myocardial Infarction" (Heart Attack). . Blood thinners: It is imperative that you stop these medications before procedures. Let us know if you if you take any blood thinner.  . Infection: Avoid procedures during or within two weeks of an infection (including chest colds or gastrointestinal problems). Symptoms associated with infections include: Localized redness, fever, chills, night sweats or profuse sweating, burning sensation when voiding, cough, congestion, stuffiness, runny nose, sore throat, diarrhea, nausea, vomiting, cold or Flu symptoms, recent or current infections. It is specially important if the infection is over the area that we intend to treat. . Heart and lung problems: Symptoms that may suggest an active cardiopulmonary problem include: cough, chest pain, breathing difficulties or shortness of breath, dizziness, ankle swelling, uncontrolled high or unusually low blood pressure, and/or palpitations. If you are experiencing any of these symptoms, cancel your procedure and contact your primary care physician for an evaluation.  Remember:  Regular Business hours are:    Monday to Thursday 8:00 AM to 4:00 PM  Provider's  Schedule: Milinda Pointer, MD:  Procedure days: Tuesday and Thursday 7:30 AM to 4:00 PM  Gillis Santa, MD:  Procedure days: Monday and Wednesday 7:30 AM to 4:00 PM ____________________________________________________________________________________________    Facet Joint Block The facet joints connect the bones of the spine (vertebrae). They make it possible for you to bend, twist, and make other movements with your spine. They also keep you from bending too far, twisting too far, and making other extreme movements. A facet joint block is a procedure in which a numbing medicine (anesthetic) is injected into a facet joint. In many cases, an anti-inflammatory medicine (steroid) is also injected. A facet joint block may be done:  To diagnose neck or back pain. If the pain gets better after a facet joint block, it means the pain is probably coming from the facet joint. If the pain does not get better, it means the pain is probably not coming from the facet joint.  To relieve neck or back pain that is caused by an inflamed facet joint. A facet joint block is only done to relieve pain if the pain does not improve with other methods, such as medicine, exercise programs, and physical therapy. Tell a health care provider about:  Any allergies you have.  All medicines you are taking, including vitamins, herbs, eye drops, creams, and over-the-counter medicines.  Any problems you or family members have had with anesthetic medicines.  Any blood disorders you have.  Any surgeries you have had.  Any medical conditions you have or have had.  Whether you are pregnant or may be pregnant. What are the risks? Generally, this is a safe procedure. However, problems may occur, including:  Bleeding.  Injury to a nerve near the injection site.  Pain at the injection site.  Weakness or numbness in areas controlled by nerves near the injection site.  Infection.  Temporary fluid  retention.  Allergic reactions to medicines or dyes.  Injury to other structures or organs near the injection site. What happens before the procedure? Medicines Ask your health care provider about:  Changing or stopping your regular medicines. This is especially important if you are taking diabetes medicines or blood thinners.  Taking medicines such as aspirin and ibuprofen. These medicines can thin your blood. Do not take these medicines unless your health care provider tells you to take them.  Taking over-the-counter medicines, vitamins, herbs, and supplements. Eating and drinking Follow instructions from your health care provider about eating and drinking, which may include:  8 hours before the procedure - stop eating heavy meals or foods, such as meat, fried foods, or fatty foods.  6 hours before the procedure - stop eating light meals or foods, such as toast or cereal.  6 hours before the procedure - stop drinking milk or drinks that contain milk.  2 hours before the procedure - stop drinking clear liquids. Staying hydrated Follow instructions from your health care provider about hydration, which may include:  Up to 2 hours before the procedure - you may continue to drink clear liquids, such as water, clear fruit juice, black coffee, and plain tea. General instructions  Do not use any products that contain nicotine or tobacco for at least 4-6 weeks before the procedure. These products include cigarettes, e-cigarettes, and chewing tobacco. If you need help quitting, ask your health care provider.  Plan to have someone take you home from the hospital or clinic.  Ask your  health care provider: ? How your surgery site will be marked. ? What steps will be taken to help prevent infection. These may include:  Removing hair at the surgery site.  Washing skin with a germ-killing soap.  Receiving antibiotic medicine. What happens during the procedure?  You will put on a  hospital gown.  You will lie on your stomach on an X-ray table. You may be asked to lie in a different position if an injection will be made in your neck.  Machines will be used to monitor your oxygen levels, heart rate, and blood pressure.  Your skin will be cleaned.  If an injection will be made in your neck, an IV will be inserted into one of your veins. Fluids and medicine will flow directly into your body through the IV.  A numbing medicine (local anesthetic) will be applied to your skin. Your skin may sting or burn for a moment.  A video X-ray machine (fluoroscopy) will be used to find the joint. In some cases, a CT scan may be used.  A contrast dye may be injected into the facet joint area to help find the joint.  When the joint is located, an anesthetic will be injected into the joint through the needle.  Your health care provider will ask you whether you feel pain relief. ? If you feel relief, a steroid may be injected to provide pain relief for a longer period of time. ? If you do not feel relief or feel only partial relief, additional injections of an anesthetic may be made in other facet joints.  The needle will be removed.  Your skin will be cleaned.  A bandage (dressing) will be applied over each injection site. The procedure may vary among health care providers and hospitals.   What happens after the procedure?  Your blood pressure, heart rate, breathing rate, and blood oxygen level will be monitored until you leave the hospital or clinic.  You will lie down and rest for a period of time. Summary  A facet joint block is a procedure in which a numbing medicine (anesthetic) is injected into a facet joint. An anti-inflammatory medicine (stereoid) may also be injected.  Follow instructions from your health care provider about medicines and eating and drinking before the procedure.  Do not use any products that contain nicotine or tobacco for at least 4-6 weeks before  the procedure.  You will lie on your stomach for the procedure, but you may be asked to lie in a different position if an injection will be made in your neck.  When the joint is located, an anesthetic will be injected into the joint through the needle. This information is not intended to replace advice given to you by your health care provider. Make sure you discuss any questions you have with your health care provider. Document Revised: 11/08/2018 Document Reviewed: 06/22/2018 [employer] Patient Education  2021 [employer].

## 2020-09-07 NOTE — Progress Notes (Signed)
PROVIDER NOTE: Information contained herein reflects review and annotations entered in association with encounter. Interpretation of such information and data should be left to medically-trained personnel. Information provided to patient can be located elsewhere in the medical record under "Patient Instructions". Document created using STT-dictation technology, any transcriptional errors that may result from process are unintentional.    Patient: Denise Burns  Service Category: Procedure  Provider: [name], MD  DOB: 02-Dec-1970  DOS: 09/08/2020  Location: [facility]  MRN: [id number]  Setting: Ambulatory - outpatient  Referring Provider: No ref. provider found  Type: Established Patient  Specialty: Interventional Pain Management  PCP: Elby Beck, FNP (Inactive)   Primary Reason for Visit: Interventional Pain Management Treatment. CC: Back Pain  Procedure:          Anesthesia, Analgesia, Anxiolysis:  Type: Lumbar Facet, Medial Branch Block(s) #3  Primary Purpose: Therapeutic Region: Posterolateral Lumbosacral Spine Level: L2, L3, L4, L5, & S1 Medial Branch Level(s). Injecting these levels blocks the L3-4, L4-5, and L5-S1 lumbar facet joints. Laterality: Bilateral  Type: Moderate (Conscious) Sedation combined with Local Anesthesia Indication(s): Analgesia and Anxiety Route: Intravenous (IV) IV Access: Secured Sedation: Meaningful verbal contact was maintained at all times during the procedure  Local Anesthetic: Lidocaine 1-2%  Position: Prone   Indications: 1. Lumbar facet syndrome (Bilateral)   2. Lumbar facet hypertrophy (Multilevel) (Bilateral)   3. Spondylosis without myelopathy or radiculopathy, lumbosacral region   4. L5-S1 pars defect w/ spondylolisthesis (Bilateral)   5. Lumbar Grade 1 Retrolisthesis (2 mm) of L2/L3   6. Lumbar pars defect (L5-S1) (Bilateral)   7. DDD (degenerative disc disease), lumbosacral   8. Chronic low back pain (1ry area  of Pain) (Bilateral) (R>L) w/o sciatica    Pain Score: Pre-procedure: 4 /10 Post-procedure: 0-No pain/10   Pre-op H&P Assessment:  Ms. Hopkins is a 50 y.o. (year old), female patient, seen today for interventional treatment. She  has a past surgical history that includes Cholecystectomy and Polypectomy. Ms. Amedee has a current medication list which includes the following prescription(s): azelastine, fludrocortisone, hydrocodone-acetaminophen, hydrocodone-acetaminophen, [START ON 10/08/2020] hydrocodone-acetaminophen, hydrocortisone, levocetirizine, meloxicam, pseudoephedrine, and tizanidine, and the following Facility-Administered Medications: fentanyl and midazolam. Her primarily concern today is the Back Pain  Initial Vital Signs:  Pulse/HCG Rate: 77ECG Heart Rate: 87 Temp: (!) 97.2 F (36.2 C) Resp: 18 BP: 132/83 SpO2: 100 %  BMI: Estimated body mass index is 30.04 kg/m as calculated from the following:   Height as of this encounter: 5\' 4"  (1.626 m).   Weight as of this encounter: 175 lb (79.4 kg).  Risk Assessment: Allergies: Reviewed. She is allergic to lisinopril, ultram [tramadol], and sulfa antibiotics.  Allergy Precautions: None required Coagulopathies: Reviewed. None identified.  Blood-thinner therapy: None at this time Active Infection(s): Reviewed. None identified. Ms. Gherardi is afebrile  Site Confirmation: Ms. Winterhalter was asked to confirm the procedure and laterality before marking the site Procedure checklist: Completed Consent: Before the procedure and under the influence of no sedative(s), amnesic(s), or anxiolytics, the patient was informed of the treatment options, risks and possible complications. To fulfill our ethical and legal obligations, as recommended by the American Medical Association's Code of Ethics, I have informed the patient of my clinical impression; the nature and purpose of the treatment or procedure; the risks, benefits, and possible  complications of the intervention; the alternatives, including doing nothing; the risk(s) and benefit(s) of the alternative treatment(s) or procedure(s); and the risk(s) and benefit(s) of doing nothing. The  patient was provided information about the general risks and possible complications associated with the procedure. These may include, but are not limited to: failure to achieve desired goals, infection, bleeding, organ or nerve damage, allergic reactions, paralysis, and death. In addition, the patient was informed of those risks and complications associated to Spine-related procedures, such as failure to decrease pain; infection (i.e.: Meningitis, epidural or intraspinal abscess); bleeding (i.e.: epidural hematoma, subarachnoid hemorrhage, or any other type of intraspinal or peri-dural bleeding); organ or nerve damage (i.e.: Any type of peripheral nerve, nerve root, or spinal cord injury) with subsequent damage to sensory, motor, and/or autonomic systems, resulting in permanent pain, numbness, and/or weakness of one or several areas of the body; allergic reactions; (i.e.: anaphylactic reaction); and/or death. Furthermore, the patient was informed of those risks and complications associated with the medications. These include, but are not limited to: allergic reactions (i.e.: anaphylactic or anaphylactoid reaction(s)); adrenal axis suppression; blood sugar elevation that in diabetics may result in ketoacidosis or comma; water retention that in patients with history of congestive heart failure may result in shortness of breath, pulmonary edema, and decompensation with resultant heart failure; weight gain; swelling or edema; medication-induced neural toxicity; particulate matter embolism and blood vessel occlusion with resultant organ, and/or nervous system infarction; and/or aseptic necrosis of one or more joints. Finally, the patient was informed that Medicine is not an exact science; therefore, there is also  the possibility of unforeseen or unpredictable risks and/or possible complications that may result in a catastrophic outcome. The patient indicated having understood very clearly. We have given the patient no guarantees and we have made no promises. Enough time was given to the patient to ask questions, all of which were answered to the patient's satisfaction. Ms. Maali has indicated that she wanted to continue with the procedure. Attestation: I, the ordering provider, attest that I have discussed with the patient the benefits, risks, side-effects, alternatives, likelihood of achieving goals, and potential problems during recovery for the procedure that I have provided informed consent. Date  Time: 09/08/2020  9:23 AM  Pre-Procedure Preparation:  Monitoring: As per clinic protocol. Respiration, ETCO2, SpO2, BP, heart rate and rhythm monitor placed and checked for adequate function Safety Precautions: Patient was assessed for positional comfort and pressure points before starting the procedure. Time-out: I initiated and conducted the "Time-out" before starting the procedure, as per protocol. The patient was asked to participate by confirming the accuracy of the "Time Out" information. Verification of the correct person, site, and procedure were performed and confirmed by me, the nursing staff, and the patient. "Time-out" conducted as per Joint Commission's Universal Protocol (UP.01.01.01). Time: [id number]  Description of Procedure:          Laterality: Bilateral. The procedure was performed in identical fashion on both sides. Levels:  L2, L3, L4, L5, & S1 Medial Branch Level(s) Area Prepped: Posterior Lumbosacral Region DuraPrep (Iodine Povacrylex [0.7% available iodine] and Isopropyl Alcohol, 74% w/w) Safety Precautions: Aspiration looking for blood return was conducted prior to all injections. At no point did we inject any substances, as a needle was being advanced. Before injecting, the patient was  told to immediately notify me if she was experiencing any new onset of "ringing in the ears, or metallic taste in the mouth". No attempts were made at seeking any paresthesias. Safe injection practices and needle disposal techniques used. Medications properly checked for expiration dates. SDV (single dose vial) medications used. After the completion of the procedure, all disposable equipment used  was discarded in the proper designated medical waste containers. Local Anesthesia: Protocol guidelines were followed. The patient was positioned over the fluoroscopy table. The area was prepped in the usual manner. The time-out was completed. The target area was identified using fluoroscopy. A 12-in long, straight, sterile hemostat was used with fluoroscopic guidance to locate the targets for each level blocked. Once located, the skin was marked with an approved surgical skin marker. Once all sites were marked, the skin (epidermis, dermis, and hypodermis), as well as deeper tissues (fat, connective tissue and muscle) were infiltrated with a small amount of a short-acting local anesthetic, loaded on a 10cc syringe with a 25G, 1.5-in  Needle. An appropriate amount of time was allowed for local anesthetics to take effect before proceeding to the next step. Local Anesthetic: Lidocaine 2.0% The unused portion of the local anesthetic was discarded in the proper designated containers. Technical explanation of process:  L2 Medial Branch Nerve Block (MBB): The target area for the L2 medial branch is at the junction of the postero-lateral aspect of the superior articular process and the superior, posterior, and medial edge of the transverse process of L3. Under fluoroscopic guidance, a Quincke needle was inserted until contact was made with os over the superior postero-lateral aspect of the pedicular shadow (target area). After negative aspiration for blood, 0.5 mL of the nerve block solution was injected without difficulty or  complication. The needle was removed intact. L3 Medial Branch Nerve Block (MBB): The target area for the L3 medial branch is at the junction of the postero-lateral aspect of the superior articular process and the superior, posterior, and medial edge of the transverse process of L4. Under fluoroscopic guidance, a Quincke needle was inserted until contact was made with os over the superior postero-lateral aspect of the pedicular shadow (target area). After negative aspiration for blood, 0.5 mL of the nerve block solution was injected without difficulty or complication. The needle was removed intact. L4 Medial Branch Nerve Block (MBB): The target area for the L4 medial branch is at the junction of the postero-lateral aspect of the superior articular process and the superior, posterior, and medial edge of the transverse process of L5. Under fluoroscopic guidance, a Quincke needle was inserted until contact was made with os over the superior postero-lateral aspect of the pedicular shadow (target area). After negative aspiration for blood, 0.5 mL of the nerve block solution was injected without difficulty or complication. The needle was removed intact. L5 Medial Branch Nerve Block (MBB): The target area for the L5 medial branch is at the junction of the postero-lateral aspect of the superior articular process and the superior, posterior, and medial edge of the sacral ala. Under fluoroscopic guidance, a Quincke needle was inserted until contact was made with os over the superior postero-lateral aspect of the pedicular shadow (target area). After negative aspiration for blood, 0.5 mL of the nerve block solution was injected without difficulty or complication. The needle was removed intact. S1 Medial Branch Nerve Block (MBB): The target area for the S1 medial branch is at the posterior and inferior 6 o'clock position of the L5-S1 facet joint. Under fluoroscopic guidance, the Quincke needle inserted for the L5 MBB was  redirected until contact was made with os over the inferior and postero aspect of the sacrum, at the 6 o' clock position under the L5-S1 facet joint (Target area). After negative aspiration for blood, 0.5 mL of the nerve block solution was injected without difficulty or complication. The needle was  removed intact.  Nerve block solution: 0.2% PF-Ropivacaine + Triamcinolone (40 mg/mL) diluted to a final concentration of 4 mg of Triamcinolone/mL of Ropivacaine The unused portion of the solution was discarded in the proper designated containers. Procedural Needles: 22-gauge, 3.5-inch, Quincke needles used for all levels.  Once the entire procedure was completed, the treated area was cleaned, making sure to leave some of the prepping solution back to take advantage of its long term bactericidal properties.   Illustration of the posterior view of the lumbar spine and the posterior neural structures. Laminae of L2 through S1 are labeled. DPRL5, dorsal primary ramus of L5; DPRS1, dorsal primary ramus of S1; DPR3, dorsal primary ramus of L3; FJ, facet (zygapophyseal) joint L3-L4; I, inferior articular process of L4; LB1, lateral branch of dorsal primary ramus of L1; IAB, inferior articular branches from L3 medial branch (supplies L4-L5 facet joint); IBP, intermediate branch plexus; MB3, medial branch of dorsal primary ramus of L3; NR3, third lumbar nerve root; S, superior articular process of L5; SAB, superior articular branches from L4 (supplies L4-5 facet joint also); TP3, transverse process of L3.  Vitals:   09/08/20 1004 09/08/20 1014 09/08/20 1024 09/08/20 1033  BP: (!) 125/94 116/81 137/78 132/76  Pulse:  71 67 66  Resp: 14 16 17 16   Temp:      TempSrc:      SpO2: 90% 94% 97% 98%  Weight:      Height:         Start Time: 0954 hrs. End Time: 1004 hrs.  Imaging Guidance (Spinal):          Type of Imaging Technique: Fluoroscopy Guidance (Spinal) Indication(s): Assistance in needle guidance and  placement for procedures requiring needle placement in or near specific anatomical locations not easily accessible without such assistance. Exposure Time: Please see nurses notes. Contrast: None used. Fluoroscopic Guidance: I was personally present during the use of fluoroscopy. "Tunnel Vision Technique" used to obtain the best possible view of the target area. Parallax error corrected before commencing the procedure. "Direction-depth-direction" technique used to introduce the needle under continuous pulsed fluoroscopy. Once target was reached, antero-posterior, oblique, and lateral fluoroscopic projection used confirm needle placement in all planes. Images permanently stored in EMR.          Interpretation: No contrast injected. I personally interpreted the imaging intraoperatively. Adequate needle placement confirmed in multiple planes. Permanent images saved into the patient's record. In addition, it was noted that several of the Lumbar spinous processes were contacting each other. The patient confirmed to occasionally be having midline low back pain likely to be secondary to this (kissing spine syndrome).  Antibiotic Prophylaxis:   Anti-infectives (From admission, onward)   None     Indication(s): None identified  Post-operative Assessment:  Post-procedure Vital Signs:  Pulse/HCG Rate: 66 (nsr)84 Temp: (!) 97.2 F (36.2 C) Resp: 16 BP: 132/76 SpO2: 98 %  EBL: None  Complications: No immediate post-treatment complications observed by team, or reported by patient.  Note: The patient tolerated the entire procedure well. A repeat set of vitals were taken after the procedure and the patient was kept under observation following institutional policy, for this type of procedure. Post-procedural neurological assessment was performed, showing return to baseline, prior to discharge. The patient was provided with post-procedure discharge instructions, including a section on how to identify  potential problems. Should any problems arise concerning this procedure, the patient was given instructions to immediately contact us, at any time, without hesitation. In any case,  we plan to contact the patient by telephone for a follow-up status report regarding this interventional procedure.  Comments:  No additional relevant information.  Plan of Care  Orders:  Orders Placed This Encounter  Procedures  . LUMBAR FACET(MEDIAL BRANCH NERVE BLOCK) MBNB    Scheduling Instructions:     Procedure: Lumbar facet block (AKA.: Lumbosacral medial branch nerve block)     Side: Bilateral     Level: L3-4, L4-5, & L5-S1 Facets (L2, L3, L4, L5, & S1 Medial Branch Nerves)     Sedation: Patient's choice.     Timeframe: Today    Order Specific Question:   Where will this procedure be performed?    Answer:   [facility]  . DG PA[facility] C-ARM 1-60 MIN NO REPORT    Intraoperative interpretation by procedural physician at [place].    Standing Status:   Standing    Number of Occurrences:   1    Order Specific Question:   Reason for exam:    Answer:   Assistance in needle guidance and placement for procedures requiring needle placement in or near specific anatomical locations not easily accessible without such assistance.  . Informed Consent Details: Physician/Practitioner Attestation; Transcribe to consent form and obtain patient signature    Nursing Order: Transcribe to consent form and obtain patient signature. Note: Always confirm laterality of pain with [name], before procedure.    Order Specific Question:   Physician/Practitioner attestation of informed consent for procedure/surgical case    Answer:   I, the physician/practitioner, attest that I have discussed with the patient the benefits, risks, side effects, alternatives, likelihood of achieving goals and potential problems during recovery for the procedure that I have provided informed consent.    Order Specific  Question:   Procedure    Answer:   Lumbar Facet Block  under fluoroscopic guidance    Order Specific Question:   Physician/Practitioner performing the procedure    Answer:   Isaack Preble A. Dossie Arbour MD    Order Specific Question:   Indication/Reason    Answer:   Low Back Pain, with our without leg pain, due to Facet Joint Arthralgia (Joint Pain) Spondylosis (Arthritis of the Spine), without myelopathy or radiculopathy (Nerve Damage).  . Provide equipment / supplies at bedside    "Block Tray" (Disposable  single use) Needle type: SpinalSpinal Amount/quantity: 4 Size: Medium (5-inch) Gauge: 22G    Standing Status:   Standing    Number of Occurrences:   1    Order Specific Question:   Specify    Answer:   Block Tray   Chronic Opioid Analgesic:  Hydrocodone/APAP 7.5/325 1 tablet p.o. 3 times daily (22.5 mg/day of hydrocodone)  MME/day:22.5mg /day.   Medications ordered for procedure: Meds ordered this encounter  Medications  . lidocaine (XYLOCAINE) 2 % (with pres) injection 400 mg  . lactated ringers infusion 1,000 mL  . midazolam (VERSED) 5 MG/5ML injection 1-2 mg    Make sure Flumazenil is available in the pyxis when using this medication. If oversedation occurs, administer 0.2 mg IV over 15 sec. If after 45 sec no response, administer 0.2 mg again over 1 min; may repeat at 1 min intervals; not to exceed 4 doses (1 mg)  . fentaNYL (SUBLIMAZE) injection 25-50 mcg    Make sure Narcan is available in the pyxis when using this medication. In the event of respiratory depression (RR< 8/min): Titrate NARCAN (naloxone) in increments of 0.1 to 0.2 mg IV at 2-3  minute intervals, until desired degree of reversal.  . ropivacaine (PF) 2 mg/mL (0.2%) (NAROPIN) injection 18 mL  . triamcinolone acetonide (KENALOG-40) injection 80 mg   Medications administered: We administered lidocaine, lactated ringers, midazolam, fentaNYL, ropivacaine (PF) 2 mg/mL (0.2%), and triamcinolone acetonide.  See the  medical record for exact dosing, route, and time of administration.  Follow-up plan:   Return in about 2 weeks (around 09/22/2020) for (F2F), (PP) Follow-up.       Considering: Diagnostic L2-3 L-FCT BLK #1  Udall joint Blk PossiblebilateralSI joint RFA Diagnostic bilateral L5 TFESI Diagnostic left-sided L2 TFESI   PRN Procedures: Palliative bilateral lumbar facet block #3 Palliative right lumbar facet RFA #2 (last done 01/07/2020) Palliative left lumbar facet RFA #2 (last done 02/04/2020)          Recent Visits Date Type Provider Dept  08/31/20 Office Visit Milinda Pointer, MD [facility]  08/20/20 Procedure visit Milinda Pointer, MD [facility]  08/03/20 Office Visit Milinda Pointer, MD [facility]  Showing recent visits within past 90 days and meeting all other requirements Today's Visits Date Type Provider Dept  09/08/20 Procedure visit Milinda Pointer, MD [facility]  Showing today's visits and meeting all other requirements Future Appointments Date Type Provider Dept  09/21/20 Appointment Milinda Pointer, MD [facility]  11/02/20 Appointment Milinda Pointer, MD [facility]  Showing future appointments within next 90 days and meeting all other requirements  Disposition: Discharge home  Discharge (Date  Time): 09/08/2020; 1034 hrs.   Primary Care Physician: Elby Beck, FNP (Inactive) Location: [facility] Note by: [name], MD Date: 09/08/2020; Time: 11:19 AM  Disclaimer:  Medicine is not an Chief Strategy Officer. The only guarantee in medicine is that nothing is guaranteed. It is important to note that the decision to proceed with this intervention was based on the information collected from the patient. The Data and conclusions were drawn from the patient's questionnaire, the interview, and the physical examination. Because the  information was provided in large part by the patient, it cannot be guaranteed that it has not been purposely or unconsciously manipulated. Every effort has been made to obtain as much relevant data as possible for this evaluation. It is important to note that the conclusions that lead to this procedure are derived in large part from the available data. Always take into account that the treatment will also be dependent on availability of resources and existing treatment guidelines, considered by other Pain Management Practitioners as being common knowledge and practice, at the time of the intervention. For Medico-Legal purposes, it is also important to point out that variation in procedural techniques and pharmacological choices are the acceptable norm. The indications, contraindications, technique, and results of the above procedure should only be interpreted and judged by a Board-Certified Interventional Pain Specialist with extensive familiarity and expertise in the same exact procedure and technique.

## 2020-09-08 ENCOUNTER — Other Ambulatory Visit: Payer: Self-pay

## 2020-09-08 ENCOUNTER — Ambulatory Visit (HOSPITAL_BASED_OUTPATIENT_CLINIC_OR_DEPARTMENT_OTHER): Payer: BC Managed Care – PPO | Admitting: Pain Medicine

## 2020-09-08 ENCOUNTER — Ambulatory Visit
Admission: RE | Admit: 2020-09-08 | Discharge: 2020-09-08 | Disposition: A | Discharge status: Home / Self Care | Payer: BC Managed Care – PPO | Source: Ambulatory Visit | Attending: Pain Medicine | Admitting: Pain Medicine

## 2020-09-08 ENCOUNTER — Encounter: Payer: Self-pay | Admitting: Pain Medicine

## 2020-09-08 VITALS — BP 132/76 | HR 66 | Temp 97.2°F | Resp 16 | Ht 64.0 in | Wt 175.0 lb

## 2020-09-08 DIAGNOSIS — M545 Low back pain, unspecified: Secondary | ICD-10-CM | POA: Diagnosis not present

## 2020-09-08 DIAGNOSIS — M5137 Other intervertebral disc degeneration, lumbosacral region: Secondary | ICD-10-CM | POA: Diagnosis not present

## 2020-09-08 DIAGNOSIS — M47816 Spondylosis without myelopathy or radiculopathy, lumbar region: Secondary | ICD-10-CM | POA: Diagnosis not present

## 2020-09-08 DIAGNOSIS — M47817 Spondylosis without myelopathy or radiculopathy, lumbosacral region: Secondary | ICD-10-CM | POA: Diagnosis not present

## 2020-09-08 DIAGNOSIS — M4310 Spondylolisthesis, site unspecified: Secondary | ICD-10-CM | POA: Diagnosis not present

## 2020-09-08 DIAGNOSIS — M4300 Spondylolysis, site unspecified: Secondary | ICD-10-CM | POA: Diagnosis not present

## 2020-09-08 DIAGNOSIS — M4820 Kissing spine, site unspecified: Secondary | ICD-10-CM | POA: Diagnosis not present

## 2020-09-08 DIAGNOSIS — M4306 Spondylolysis, lumbar region: Secondary | ICD-10-CM | POA: Insufficient documentation

## 2020-09-08 DIAGNOSIS — G8929 Other chronic pain: Secondary | ICD-10-CM | POA: Diagnosis not present

## 2020-09-08 MED ORDER — MIDAZOLAM HCL 5 MG/5ML IJ SOLN
INTRAMUSCULAR | Status: AC
  Filled 2020-09-08: qty 5

## 2020-09-08 MED ORDER — MIDAZOLAM HCL 5 MG/5ML IJ SOLN
1.0000 mg | INTRAMUSCULAR | Status: DC | PRN
  Administered 2020-09-08: 2 mg via INTRAVENOUS

## 2020-09-08 MED ORDER — LACTATED RINGERS IV SOLN
1000.0000 mL | Freq: Once | INTRAVENOUS | Status: AC
  Administered 2020-09-08: 1000 mL via INTRAVENOUS

## 2020-09-08 MED ORDER — LIDOCAINE HCL 2 % IJ SOLN
20.0000 mL | Freq: Once | INTRAMUSCULAR | Status: AC
  Administered 2020-09-08: 400 mg
  Filled 2020-09-08: qty 20

## 2020-09-08 MED ORDER — ROPIVACAINE HCL 2 MG/ML IJ SOLN
INTRAMUSCULAR | Status: AC
  Filled 2020-09-08: qty 20

## 2020-09-08 MED ORDER — ROPIVACAINE HCL 2 MG/ML IJ SOLN
18.0000 mL | Freq: Once | INTRAMUSCULAR | Status: AC
  Administered 2020-09-08: 18 mL via PERINEURAL

## 2020-09-08 MED ORDER — LIDOCAINE HCL (PF) 2 % IJ SOLN
INTRAMUSCULAR | Status: AC
  Filled 2020-09-08: qty 20

## 2020-09-08 MED ORDER — TRIAMCINOLONE ACETONIDE 40 MG/ML IJ SUSP
INTRAMUSCULAR | Status: AC
  Filled 2020-09-08: qty 2

## 2020-09-08 MED ORDER — TRIAMCINOLONE ACETONIDE 40 MG/ML IJ SUSP
80.0000 mg | Freq: Once | INTRAMUSCULAR | Status: AC
  Administered 2020-09-08: 80 mg

## 2020-09-08 MED ORDER — FENTANYL CITRATE (PF) 100 MCG/2ML IJ SOLN
25.0000 ug | INTRAMUSCULAR | Status: DC | PRN
  Administered 2020-09-08: 50 ug via INTRAVENOUS

## 2020-09-08 MED ORDER — FENTANYL CITRATE (PF) 100 MCG/2ML IJ SOLN
INTRAMUSCULAR | Status: AC
  Filled 2020-09-08: qty 2

## 2020-09-08 NOTE — Patient Instructions (Signed)

## 2020-09-08 NOTE — Progress Notes (Signed)
Safety precautions to be maintained throughout the outpatient stay will include: orient to surroundings, keep bed in low position, maintain call bell within reach at all times, provide assistance with transfer out of bed and ambulation.  

## 2020-09-09 ENCOUNTER — Telehealth: Payer: Self-pay | Admitting: *Deleted

## 2020-09-09 NOTE — Telephone Encounter (Signed)
Spoke with patient re; procedure on yesterday, no questions or concerns.  

## 2020-09-20 NOTE — Progress Notes (Signed)
PROVIDER NOTE: Information contained herein reflects review and annotations entered in association with encounter. Interpretation of such information and data should be left to medically-trained personnel. Information provided to patient can be located elsewhere in the medical record under "Patient Instructions". Document created using STT-dictation technology, any transcriptional errors that may result from process are unintentional.    Patient: Denise Burns  Service Category: E/M  Provider: [name], MD  DOB: Dec 12, 1970  DOS: 09/21/2020  Specialty: Interventional Pain Management  MRN: [id number]  Setting: Ambulatory outpatient  PCP: Elby Beck, FNP (Inactive)  Type: Established Patient    Referring Provider: No ref. provider found  Location: Office  Delivery: Face-to-face     HPI  [name], a 50 y.o. year old female, is here today because of her Chronic pain syndrome [G89.4]. [name]'s primary complain today is Back Pain Last encounter: My last encounter with her was on 09/08/2020. Pertinent problems: [name] has Chronic midline low back pain with bilateral sciatica; Chronic low back pain (1ry area of Pain) (Bilateral) (R>L) w/o sciatica; Chronic sacroiliac joint pain (2ry area of Pain) (Right); Chronic pain syndrome; Abnormal MRI, lumbar spine (03/22/2018); Lumbar facet hypertrophy (Multilevel) (Bilateral); Lumbar facet syndrome (Bilateral); DDD (degenerative disc disease), lumbosacral; Spondylolisthesis (8 mm) at L5-S1 level; Lumbar Grade 1 Retrolisthesis (2 mm) of L2/L3; L5-S1 pars defect w/ spondylolisthesis (Bilateral); Lumbar pars defect (L5-S1) (Bilateral); Lumbar foraminal stenosis (Left: L2-3) (Bilateral: L5-S1); Osteoarthritis of sacroiliac joint (Bilateral); Chronic musculoskeletal pain; Neurogenic pain; Osteoarthritis of facet joint of lumbar spine; Greater trochanteric bursitis (Bilateral); Lumbar spondylosis; Spondylosis without myelopathy or radiculopathy,  lumbosacral region; Acute postoperative pain; Chronic hip pain (Right); Osteoarthritis of hip (Right); Lumbar radiculitis (L1/L2) (Right); Chronic groin pain (Right); Chronic hip pain (Bilateral); Enthesopathy hip region (Left); Primary osteoarthritis of both hips; Kissing spine syndrome; and Chronic low back pain (Midline) w/o sciatica on their pertinent problem list. Pain Assessment: Severity of   is reported as a 3 /10. Location: Back Lower/Denies. Onset: More than a month ago. Quality: Aching. Timing: Constant. Modifying factor(s): meds, heat and rest. Vitals:  height is _0  (1.626 m) and weight is 175 lb (79.4 kg). Her temperature is 97.3 F (36.3 C) (abnormal). Her blood pressure is 135/92 (abnormal) and her pulse is 81. Her oxygen saturation is 100%.   Reason for encounter: post-procedure assessment.  According to patient she attained 100% relief of the pain for the duration of the local anesthetic.  In fact, she has continued to enjoy and ongoing 60% improvement in her pain.  Review of the x-rays would suggest the patient to be a relatively good candidate for radiofrequency ablation.  Her current BMI is 30.04 kg/m.  Today I have talked to the patient about the alternative of doing radiofrequency ablation and she is aware that she has that choice.  For now, she seems to be doing well and indicates not needing anything else.  Her medications continue to work well and she has a follow-up appointment for medication management in April.  We will see her at that time for follow-up.  Post-Procedure Evaluation  Procedure (09/08/2020): Therapeutic bilateral lumbar facet block #3 under fluoroscopic guidance and IV sedation Pre-procedure pain level: 4/10 Post-procedure: 0/10 (100% relief)  Sedation: Sedation provided.  Effectiveness during initial hour after procedure(Ultra-Short Term Relief): 100 %.  Local anesthetic used: Long-acting (4-6 hours) Effectiveness: Defined as any analgesic benefit obtained  secondary to the administration of local anesthetics. This carries significant diagnostic value as to the etiological  location, or anatomical origin, of the pain. Duration of benefit is expected to coincide with the duration of the local anesthetic used.  Effectiveness during initial 4-6 hours after procedure(Short-Term Relief): 100 %.  Long-term benefit: Defined as any relief past the pharmacologic duration of the local anesthetics.  Effectiveness past the initial 6 hours after procedure(Long-Term Relief): 60 % (ongoing).  Current benefits: Defined as benefit that persist at this time.   Analgesia:  >50% relief Function: [name] reports improvement in function ROM: [name] reports improvement in ROM  Pharmacotherapy Assessment   Analgesic: Hydrocodone/APAP 7.5/325 1 tablet p.o. 3 times daily (22.5 mg/day of hydrocodone)  MME/day:22.55m/day.   Monitoring: [place] PMP: PDMP reviewed during this encounter.       Pharmacotherapy: No side-effects or adverse reactions reported. Compliance: No problems identified. Effectiveness: Clinically acceptable.  BChauncey Fischer RN  09/21/2020 11:09 AM  Sign when Signing Visit Safety precautions to be maintained throughout the outpatient stay will include: orient to surroundings, keep bed in low position, maintain call bell within reach at all times, provide assistance with transfer out of bed and ambulation.     UDS:  Summary  Date Value Ref Range Status  02/11/2020 Note  Final    Comment:    ==================================================================== [employer] (MW) ==================================================================== Test                             Result       Flag       Units  Drug Present not Declared for Prescription Verification   Alprazolam                     23           UNEXPECTED ng/mg creat   Alpha-hydroxyalprazolam        16           UNEXPECTED ng/mg creat    Source of alprazolam is a  scheduled prescription medication. Alpha-    hydroxyalprazolam is an expected metabolite of alprazolam.    Oxycodone                      2136         UNEXPECTED ng/mg creat   Oxymorphone                    148          UNEXPECTED ng/mg creat   Noroxycodone                   5926         UNEXPECTED ng/mg creat   Noroxymorphone                 324          UNEXPECTED ng/mg creat    Sources of oxycodone are scheduled prescription medications.    Oxymorphone, noroxycodone, and noroxymorphone are expected    metabolites of oxycodone. Oxymorphone is also available as a    scheduled prescription medication.    Butalbital                     PRESENT      UNEXPECTED  Drug Absent but Declared for Prescription Verification   Hydrocodone                    Not Detected UNEXPECTED ng/mg creat ====================================================================  Test                      Result    Flag   Units      Ref Range   Creatinine              146              mg/dL      >=20 ==================================================================== Declared Medications:  The flagging and interpretation on this report are based on the  following declared medications.  Unexpected results may arise from  inaccuracies in the declared medications.   **Note: The testing scope of this panel includes these medications:   Hydrocodone   **Note: The testing scope of this panel does not include the  following reported medications:   Acetaminophen  Baclofen  Cetirizine  Fludrocortisone  Hydrocortisone  Meloxicam  Pseudoephedrine (Sudafed) ==================================================================== For clinical consultation, please call [phone]. ====================================================================      ROS  Constitutional: Denies any fever or chills Gastrointestinal: No reported hemesis, hematochezia, vomiting, or acute GI distress Musculoskeletal: Denies any  acute onset joint swelling, redness, loss of ROM, or weakness Neurological: No reported episodes of acute onset apraxia, aphasia, dysarthria, agnosia, amnesia, paralysis, loss of coordination, or loss of consciousness  Medication Review  HYDROcodone-acetaminophen, azelastine, fludrocortisone, hydrocortisone, levocetirizine, meloxicam, pseudoephedrine, and tiZANidine  History Review  Allergy: [name] is allergic to lisinopril, ultram [tramadol], and sulfa antibiotics. Drug: [name]  reports no history of drug use. Alcohol:  reports current alcohol use. Tobacco:  reports that she has quit smoking. She has never used smokeless tobacco. Social: [name]  reports that she has quit smoking. She has never used smokeless tobacco. She reports current alcohol use. She reports that she does not use drugs. Medical:  has a past medical history of Addison disease ([place]), Arthritis, Back pain, and GERD (gastroesophageal reflux disease). Surgical: [name]  has a past surgical history that includes Cholecystectomy and Polypectomy. Family: family history includes Alcohol abuse in her father; Arthritis in her mother; Hearing loss in her father; Hyperlipidemia in her father and mother; Hypertension in her mother.  Laboratory Chemistry Profile   Renal Lab Results  Component Value Date   BUN 22 08/13/2020   CREATININE 0.76 16/60/6301   BCR NOT APPLICABLE 60/05/9322   GFR 72.11 08/13/2019   GFRAA 86 08/16/2018   GFRNONAA 74 08/16/2018     Hepatic Lab Results  Component Value Date   AST 18 08/16/2018   ALBUMIN 4.4 08/16/2018   ALKPHOS 64 08/16/2018     Electrolytes Lab Results  Component Value Date   NA 141 08/13/2020   K 3.6 08/13/2020   CL 105 08/13/2020   CALCIUM 9.5 08/13/2020   MG 2.1 08/16/2018     Bone Lab Results  Component Value Date   VD25OH 13.55 (L) 04/30/2018   25OHVITD1 67 08/16/2018   25OHVITD2 <1.0 08/16/2018   25OHVITD3 67 08/16/2018     Inflammation  (CRP: Acute Phase) (ESR: Chronic Phase) Lab Results  Component Value Date   CRP 2 08/16/2018   ESRSEDRATE 37 (H) 08/16/2018       Note: Above Lab results reviewed.  Recent Imaging Review  DG PAIN CLINIC C-ARM 1-60 MIN NO REPORT Fluoro was used, but no Radiologist interpretation will be provided.  Please refer to "NOTES" tab for provider progress note. Note: Reviewed             Physical Exam  General appearance: Well nourished,  well developed, and well hydrated. In no apparent acute distress Mental status: Alert, oriented x 3 (person, place, & time)       Respiratory: No evidence of acute respiratory distress Eyes: PERLA Vitals: BP (!) 135/92   Pulse 81   Temp (!) 97.3 F (36.3 C)   Ht _0  (1.626 m)   Wt 175 lb (79.4 kg)   LMP 06/08/2018 (Exact Date)   SpO2 100%   BMI 30.04 kg/m  BMI: Estimated body mass index is 30.04 kg/m as calculated from the following:   Height as of this encounter: _1  (1.626 m).   Weight as of this encounter: 175 lb (79.4 kg). Ideal: Ideal body weight: 54.7 kg (120 lb 9.5 oz) Adjusted ideal body weight: 64.6 kg (142 lb 5.7 oz)  Assessment   Status Diagnosis  Controlled Controlled Controlled 1. Chronic pain syndrome   2. Lumbar facet syndrome (Bilateral)   3. Lumbar facet hypertrophy (Multilevel) (Bilateral)   4. Spondylosis without myelopathy or radiculopathy, lumbosacral region   5. L5-S1 pars defect w/ spondylolisthesis (Bilateral)   6. Lumbar Grade 1 Retrolisthesis (2 mm) of L2/L3   7. Lumbar pars defect (L5-S1) (Bilateral)   8. DDD (degenerative disc disease), lumbosacral   9. Chronic low back pain (1ry area of Pain) (Bilateral) (R>L) w/o sciatica   10. Kissing spine syndrome      Updated Problems: No problems updated.  Plan of Care  Problem-specific:  No problem-specific Assessment & Plan notes found for this encounter.  [name] has a current medication list which includes the following long-term medication(s):  azelastine, hydrocodone-acetaminophen, hydrocodone-acetaminophen, [START ON 10/08/2020] hydrocodone-acetaminophen, levocetirizine, and meloxicam.  Pharmacotherapy (Medications Ordered): No orders of the defined types were placed in this encounter.  Orders:  No orders of the defined types were placed in this encounter.  Follow-up plan:   Return for scheduled encounter.      Considering: Diagnostic L2-3 L-FCT BLK #1  [place] joint Blk PossiblebilateralSI joint RFA Diagnostic bilateral L5 TFESI Diagnostic left-sided L2 TFESI   PRN Procedures: Palliative bilateral lumbar facet block #3 Palliative right lumbar facet RFA #2 (last done 01/07/2020) Palliative left lumbar facet RFA #2 (last done 02/04/2020)           Recent Visits Date Type Provider Dept  09/08/20 Procedure visit Milinda Pointer, MD [facility]  08/31/20 Office Visit Milinda Pointer, MD [facility]  08/20/20 Procedure visit Milinda Pointer, MD [facility]  08/03/20 Office Visit Milinda Pointer, MD [facility]  Showing recent visits within past 90 days and meeting all other requirements Today's Visits Date Type Provider Dept  09/21/20 Office Visit Milinda Pointer, MD [facility]  Showing today's visits and meeting all other requirements Future Appointments Date Type Provider Dept  11/02/20 Appointment Milinda Pointer, MD [facility]  Showing future appointments within next 90 days and meeting all other requirements  I discussed the assessment and treatment plan with the patient. The patient was provided an opportunity to ask questions and all were answered. The patient agreed with the plan and demonstrated an understanding of the instructions.  Patient advised to call back or seek an in-person evaluation if the symptoms or condition worsens.  Duration of encounter: 30 minutes.  Note by: [name], MD Date:  09/21/2020; Time: 11:31 AM

## 2020-09-21 ENCOUNTER — Encounter: Payer: Self-pay | Admitting: Pain Medicine

## 2020-09-21 ENCOUNTER — Ambulatory Visit: Payer: BC Managed Care – PPO | Attending: Pain Medicine | Admitting: Pain Medicine

## 2020-09-21 ENCOUNTER — Other Ambulatory Visit: Payer: Self-pay

## 2020-09-21 VITALS — BP 135/92 | HR 81 | Temp 97.3°F | Ht 64.0 in | Wt 175.0 lb

## 2020-09-21 DIAGNOSIS — G894 Chronic pain syndrome: Secondary | ICD-10-CM

## 2020-09-21 DIAGNOSIS — M47817 Spondylosis without myelopathy or radiculopathy, lumbosacral region: Secondary | ICD-10-CM

## 2020-09-21 DIAGNOSIS — M4300 Spondylolysis, site unspecified: Secondary | ICD-10-CM | POA: Insufficient documentation

## 2020-09-21 DIAGNOSIS — M545 Low back pain, unspecified: Secondary | ICD-10-CM | POA: Diagnosis not present

## 2020-09-21 DIAGNOSIS — G8929 Other chronic pain: Secondary | ICD-10-CM

## 2020-09-21 DIAGNOSIS — M4820 Kissing spine, site unspecified: Secondary | ICD-10-CM

## 2020-09-21 DIAGNOSIS — M5137 Other intervertebral disc degeneration, lumbosacral region: Secondary | ICD-10-CM | POA: Diagnosis not present

## 2020-09-21 DIAGNOSIS — M4306 Spondylolysis, lumbar region: Secondary | ICD-10-CM

## 2020-09-21 DIAGNOSIS — M4310 Spondylolisthesis, site unspecified: Secondary | ICD-10-CM | POA: Diagnosis not present

## 2020-09-21 DIAGNOSIS — M51379 Other intervertebral disc degeneration, lumbosacral region without mention of lumbar back pain or lower extremity pain: Secondary | ICD-10-CM

## 2020-09-21 DIAGNOSIS — M47816 Spondylosis without myelopathy or radiculopathy, lumbar region: Secondary | ICD-10-CM | POA: Diagnosis not present

## 2020-09-21 NOTE — Progress Notes (Signed)
Safety precautions to be maintained throughout the outpatient stay will include: orient to surroundings, keep bed in low position, maintain call bell within reach at all times, provide assistance with transfer out of bed and ambulation.  

## 2020-09-23 ENCOUNTER — Other Ambulatory Visit: Payer: Self-pay | Admitting: Internal Medicine

## 2020-10-06 ENCOUNTER — Emergency Department: Payer: BC Managed Care – PPO

## 2020-10-06 ENCOUNTER — Inpatient Hospital Stay
Admission: EM | Admit: 2020-10-06 | Discharge: 2020-10-08 | DRG: 872 | Disposition: A | Discharge status: Home / Self Care | Payer: BC Managed Care – PPO | Attending: Internal Medicine | Admitting: Internal Medicine

## 2020-10-06 ENCOUNTER — Encounter: Payer: Self-pay | Admitting: Emergency Medicine

## 2020-10-06 ENCOUNTER — Other Ambulatory Visit: Payer: Self-pay

## 2020-10-06 DIAGNOSIS — M1990 Unspecified osteoarthritis, unspecified site: Secondary | ICD-10-CM | POA: Diagnosis not present

## 2020-10-06 DIAGNOSIS — R1032 Left lower quadrant pain: Secondary | ICD-10-CM | POA: Diagnosis present

## 2020-10-06 DIAGNOSIS — Z83438 Family history of other disorder of lipoprotein metabolism and other lipidemia: Secondary | ICD-10-CM | POA: Diagnosis not present

## 2020-10-06 DIAGNOSIS — Z79899 Other long term (current) drug therapy: Secondary | ICD-10-CM | POA: Diagnosis not present

## 2020-10-06 DIAGNOSIS — Z7952 Long term (current) use of systemic steroids: Secondary | ICD-10-CM | POA: Diagnosis not present

## 2020-10-06 DIAGNOSIS — Z888 Allergy status to other drugs, medicaments and biological substances status: Secondary | ICD-10-CM | POA: Diagnosis not present

## 2020-10-06 DIAGNOSIS — Z811 Family history of alcohol abuse and dependence: Secondary | ICD-10-CM

## 2020-10-06 DIAGNOSIS — G894 Chronic pain syndrome: Secondary | ICD-10-CM | POA: Diagnosis not present

## 2020-10-06 DIAGNOSIS — Z882 Allergy status to sulfonamides status: Secondary | ICD-10-CM

## 2020-10-06 DIAGNOSIS — Z885 Allergy status to narcotic agent status: Secondary | ICD-10-CM

## 2020-10-06 DIAGNOSIS — M5442 Lumbago with sciatica, left side: Secondary | ICD-10-CM | POA: Diagnosis not present

## 2020-10-06 DIAGNOSIS — A4151 Sepsis due to Escherichia coli [E. coli]: Principal | ICD-10-CM | POA: Diagnosis present

## 2020-10-06 DIAGNOSIS — G8929 Other chronic pain: Secondary | ICD-10-CM | POA: Diagnosis not present

## 2020-10-06 DIAGNOSIS — K219 Gastro-esophageal reflux disease without esophagitis: Secondary | ICD-10-CM | POA: Diagnosis not present

## 2020-10-06 DIAGNOSIS — N10 Acute tubulo-interstitial nephritis: Secondary | ICD-10-CM | POA: Diagnosis not present

## 2020-10-06 DIAGNOSIS — M5441 Lumbago with sciatica, right side: Secondary | ICD-10-CM | POA: Diagnosis not present

## 2020-10-06 DIAGNOSIS — N200 Calculus of kidney: Secondary | ICD-10-CM

## 2020-10-06 DIAGNOSIS — N12 Tubulo-interstitial nephritis, not specified as acute or chronic: Secondary | ICD-10-CM | POA: Diagnosis not present

## 2020-10-06 DIAGNOSIS — Z87891 Personal history of nicotine dependence: Secondary | ICD-10-CM

## 2020-10-06 DIAGNOSIS — N281 Cyst of kidney, acquired: Secondary | ICD-10-CM | POA: Diagnosis not present

## 2020-10-06 DIAGNOSIS — Z20822 Contact with and (suspected) exposure to covid-19: Secondary | ICD-10-CM | POA: Diagnosis present

## 2020-10-06 DIAGNOSIS — Z791 Long term (current) use of non-steroidal anti-inflammatories (NSAID): Secondary | ICD-10-CM | POA: Diagnosis not present

## 2020-10-06 DIAGNOSIS — A419 Sepsis, unspecified organism: Secondary | ICD-10-CM | POA: Diagnosis not present

## 2020-10-06 DIAGNOSIS — Z8261 Family history of arthritis: Secondary | ICD-10-CM

## 2020-10-06 DIAGNOSIS — Z8249 Family history of ischemic heart disease and other diseases of the circulatory system: Secondary | ICD-10-CM

## 2020-10-06 DIAGNOSIS — E271 Primary adrenocortical insufficiency: Secondary | ICD-10-CM | POA: Diagnosis not present

## 2020-10-06 DIAGNOSIS — M4319 Spondylolisthesis, multiple sites in spine: Secondary | ICD-10-CM | POA: Diagnosis not present

## 2020-10-06 DIAGNOSIS — D1809 Hemangioma of other sites: Secondary | ICD-10-CM | POA: Diagnosis not present

## 2020-10-06 LAB — URINALYSIS, COMPLETE (UACMP) WITH MICROSCOPIC
Bilirubin Urine: NEGATIVE
Glucose, UA: NEGATIVE mg/dL
Ketones, ur: NEGATIVE mg/dL
Nitrite: NEGATIVE
Protein, ur: 100 mg/dL — AB
RBC / HPF: >50 RBC/hpf — ABNORMAL HIGH (ref 0–5)
Specific Gravity, Urine: 1.018 (ref 1.005–1.030)
WBC, UA: >50 WBC/hpf — ABNORMAL HIGH (ref 0–5)
pH: 8 (ref 5.0–8.0)

## 2020-10-06 LAB — COMPREHENSIVE METABOLIC PANEL
ALT: 34 U/L (ref 0–44)
AST: 38 U/L (ref 15–41)
Albumin: 3.8 g/dL (ref 3.5–5.0)
Alkaline Phosphatase: 68 U/L (ref 38–126)
Anion gap: 8 (ref 5–15)
BUN: 25 mg/dL — ABNORMAL HIGH (ref 6–20)
CO2: 24 mmol/L (ref 22–32)
Calcium: 9.1 mg/dL (ref 8.9–10.3)
Chloride: 109 mmol/L (ref 98–111)
Creatinine, Ser: 1.03 mg/dL — ABNORMAL HIGH (ref 0.44–1.00)
GFR, Estimated: >60 mL/min (ref >60)
Glucose, Bld: 102 mg/dL — ABNORMAL HIGH (ref 70–99)
Potassium: 3.2 mmol/L — ABNORMAL LOW (ref 3.5–5.1)
Sodium: 141 mmol/L (ref 135–145)
Total Bilirubin: 1.9 mg/dL — ABNORMAL HIGH (ref 0.3–1.2)
Total Protein: 6.8 g/dL (ref 6.5–8.1)

## 2020-10-06 LAB — CBC
HCT: 40.8 % (ref 36.0–46.0)
Hemoglobin: 13.7 g/dL (ref 12.0–15.0)
MCH: 31.7 pg (ref 26.0–34.0)
MCHC: 33.6 g/dL (ref 30.0–36.0)
MCV: 94.4 fL (ref 80.0–100.0)
Platelets: 139 10*3/uL — ABNORMAL LOW (ref 150–400)
RBC: 4.32 MIL/uL (ref 3.87–5.11)
RDW: 12.6 % (ref 11.5–15.5)
WBC: 11.5 10*3/uL — ABNORMAL HIGH (ref 4.0–10.5)
nRBC: 0 % (ref 0.0–0.2)

## 2020-10-06 LAB — POC URINE PREG, ED: Preg Test, Ur: NEGATIVE

## 2020-10-06 LAB — LIPASE, BLOOD: Lipase: 30 U/L (ref 11–51)

## 2020-10-06 LAB — LACTIC ACID, PLASMA: Lactic Acid, Venous: 1.4 mmol/L (ref 0.5–1.9)

## 2020-10-06 MED ORDER — LACTATED RINGERS IV SOLN: INTRAVENOUS | Status: AC

## 2020-10-06 MED ORDER — HYDROCORTISONE 10 MG PO TABS
10.0000 mg | ORAL_TABLET | Freq: Two times a day (BID) | ORAL | Status: AC
  Administered 2020-10-06 – 2020-10-07 (×3): 10 mg via ORAL
  Filled 2020-10-06 (×3): qty 1

## 2020-10-06 MED ORDER — PSEUDOEPHEDRINE HCL 30 MG PO TABS
30.0000 mg | ORAL_TABLET | Freq: Two times a day (BID) | ORAL | Status: DC | PRN
  Filled 2020-10-06: qty 1

## 2020-10-06 MED ORDER — LACTATED RINGERS IV BOLUS
1000.0000 mL | Freq: Once | INTRAVENOUS | Status: AC
  Administered 2020-10-06: 1000 mL via INTRAVENOUS

## 2020-10-06 MED ORDER — MORPHINE SULFATE (PF) 4 MG/ML IV SOLN
4.0000 mg | Freq: Once | INTRAVENOUS | Status: AC
  Administered 2020-10-06: 4 mg via INTRAVENOUS
  Filled 2020-10-06: qty 1

## 2020-10-06 MED ORDER — HYDROCORTISONE 10 MG PO TABS: 10.0000 mg | ORAL_TABLET | Freq: Two times a day (BID) | ORAL | Status: DC

## 2020-10-06 MED ORDER — ONDANSETRON HCL 4 MG PO TABS: 4.0000 mg | ORAL_TABLET | Freq: Four times a day (QID) | ORAL | Status: DC | PRN

## 2020-10-06 MED ORDER — HYDROCORTISONE 5 MG PO TABS
15.0000 mg | ORAL_TABLET | Freq: Every day | ORAL | Status: DC
  Filled 2020-10-06: qty 1

## 2020-10-06 MED ORDER — SODIUM CHLORIDE 0.9 % IV BOLUS
1000.0000 mL | Freq: Once | INTRAVENOUS | Status: AC
  Administered 2020-10-06: 1000 mL via INTRAVENOUS

## 2020-10-06 MED ORDER — ONDANSETRON HCL 4 MG/2ML IJ SOLN
4.0000 mg | Freq: Four times a day (QID) | INTRAMUSCULAR | Status: DC | PRN
  Administered 2020-10-07 – 2020-10-08 (×2): 4 mg via INTRAVENOUS
  Filled 2020-10-06 (×2): qty 2

## 2020-10-06 MED ORDER — ACETAMINOPHEN 325 MG PO TABS
325.0000 mg | ORAL_TABLET | Freq: Four times a day (QID) | ORAL | Status: DC | PRN
  Administered 2020-10-06 – 2020-10-08 (×4): 325 mg via ORAL
  Filled 2020-10-06 (×4): qty 1

## 2020-10-06 MED ORDER — SODIUM CHLORIDE 0.9 % IV SOLN
1.0000 g | INTRAVENOUS | Status: DC
  Administered 2020-10-07 – 2020-10-08 (×2): 1 g via INTRAVENOUS
  Filled 2020-10-06 (×2): qty 1
  Filled 2020-10-06: qty 10

## 2020-10-06 MED ORDER — FLUTICASONE PROPIONATE 50 MCG/ACT NA SUSP
2.0000 | Freq: Every day | NASAL | Status: DC
  Administered 2020-10-07 – 2020-10-08 (×2): 2 via NASAL
  Filled 2020-10-06: qty 16

## 2020-10-06 MED ORDER — ONDANSETRON HCL 4 MG/2ML IJ SOLN
4.0000 mg | INTRAMUSCULAR | Status: AC
  Administered 2020-10-06: 4 mg via INTRAVENOUS
  Filled 2020-10-06: qty 2

## 2020-10-06 MED ORDER — FLUDROCORTISONE ACETATE 0.1 MG PO TABS
50.0000 ug | ORAL_TABLET | Freq: Every day | ORAL | Status: DC
  Administered 2020-10-07 – 2020-10-08 (×2): 50 ug via ORAL
  Filled 2020-10-06 (×3): qty 0.5

## 2020-10-06 MED ORDER — KETOROLAC TROMETHAMINE 30 MG/ML IJ SOLN
15.0000 mg | Freq: Once | INTRAMUSCULAR | Status: AC
  Administered 2020-10-06: 15 mg via INTRAVENOUS
  Filled 2020-10-06: qty 1

## 2020-10-06 MED ORDER — HYDROCORTISONE 5 MG PO TABS: 15.0000 mg | ORAL_TABLET | Freq: Every day | ORAL | Status: DC

## 2020-10-06 MED ORDER — LORATADINE 10 MG PO TABS
10.0000 mg | ORAL_TABLET | Freq: Every day | ORAL | Status: DC
  Administered 2020-10-06 – 2020-10-07 (×2): 10 mg via ORAL
  Filled 2020-10-06 (×2): qty 1

## 2020-10-06 MED ORDER — MORPHINE SULFATE (PF) 4 MG/ML IV SOLN
4.0000 mg | Freq: Once | INTRAVENOUS | Status: AC
Start: 2020-10-06 — End: 2020-10-06
  Administered 2020-10-06: 4 mg via INTRAVENOUS
  Filled 2020-10-06: qty 1

## 2020-10-06 MED ORDER — AZELASTINE HCL 0.1 % NA SOLN
2.0000 | Freq: Two times a day (BID) | NASAL | Status: DC
  Administered 2020-10-06 – 2020-10-08 (×4): 2 via NASAL
  Filled 2020-10-06: qty 30

## 2020-10-06 MED ORDER — ACETAMINOPHEN 650 MG RE SUPP: 325.0000 mg | Freq: Four times a day (QID) | RECTAL | Status: DC | PRN

## 2020-10-06 MED ORDER — MORPHINE SULFATE (PF) 4 MG/ML IV SOLN
4.0000 mg | INTRAVENOUS | Status: DC | PRN
  Administered 2020-10-06 – 2020-10-08 (×5): 4 mg via INTRAVENOUS
  Filled 2020-10-06 (×5): qty 1

## 2020-10-06 MED ORDER — LEVOCETIRIZINE DIHYDROCHLORIDE 5 MG PO TABS: 5.0000 mg | ORAL_TABLET | Freq: Every evening | ORAL | Status: DC

## 2020-10-06 MED ORDER — ENOXAPARIN SODIUM 40 MG/0.4ML ~~LOC~~ SOLN
40.0000 mg | SUBCUTANEOUS | Status: DC
  Administered 2020-10-06 – 2020-10-07 (×2): 40 mg via SUBCUTANEOUS
  Filled 2020-10-06 (×2): qty 0.4

## 2020-10-06 MED ORDER — SODIUM CHLORIDE 0.9 % IV SOLN
1.0000 g | Freq: Once | INTRAVENOUS | Status: AC
  Administered 2020-10-06: 1 g via INTRAVENOUS
  Filled 2020-10-06: qty 10

## 2020-10-06 MED ORDER — HYDROCORTISONE 5 MG PO TABS
5.0000 mg | ORAL_TABLET | Freq: Every day | ORAL | Status: DC
  Filled 2020-10-06: qty 1

## 2020-10-06 MED ORDER — TIZANIDINE HCL 4 MG PO TABS
4.0000 mg | ORAL_TABLET | Freq: Three times a day (TID) | ORAL | Status: DC
  Administered 2020-10-06 – 2020-10-07 (×2): 4 mg via ORAL
  Filled 2020-10-06 (×3): qty 1

## 2020-10-06 MED ORDER — FLUTICASONE FUROATE 27.5 MCG/SPRAY NA SUSP: 2.0000 | Freq: Every day | NASAL | Status: DC

## 2020-10-06 NOTE — ED Notes (Signed)
Spoke with sepsis Designer, fashion/clothing. Antibiotics were indeed given prior to blood cultures being drawn because the order for blood cultures and the code sepsis activation occurred after the antibiotics had been given.

## 2020-10-06 NOTE — ED Triage Notes (Signed)
C/O left sided pain last night causing patient to have chills and emesis.  This morning patient reports diarrhea, no emesis.

## 2020-10-06 NOTE — Sepsis Progress Note (Signed)
elink is following this code sepsis 

## 2020-10-06 NOTE — ED Provider Notes (Signed)
[facility] Emergency Department Provider Note   ____________________________________________   Event Date/Time   First MD Initiated Contact with Patient 10/06/20 1012     (approximate)  I have reviewed the triage vital signs and the nursing notes.   HISTORY  Chief Complaint Flank Pain    HPI Denise Burns is a 50 y.o. female last night beginning developing pain in her left flank   Pain started in the left flank, it has come and gone, to the point that she did not feel she could continue at work today.  She reports it is a sharp pain located in her left flank.  Also some in discomfort in her lower abdomen.  She noticed a little bit of blood in her urine today as well  Having some chills no obvious fever.  Positive for nausea vomited a few times last night due to pain in the left flank.  Denies pregnancy, reports going through menopause  No chest pain or trouble breathing.  Pain is moderate to severe located in the left flank area primarily  Past Medical History:  Diagnosis Date  . Addison disease ([place])   . Arthritis   . Back pain   . GERD (gastroesophageal reflux disease)     Patient Active Problem List   Diagnosis Date Noted  . Pyelonephritis 10/06/2020  . 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 Chronic low back pain (Midline) w/o sciatica 09/08/2020  . Allergic rhinitis 08/31/2020  . Allergic rhinitis due to animal (cat) (dog) hair and dander 08/31/2020  . Chronic allergic conjunctivitis 08/31/2020  . Deviated nasal septum 08/31/2020  . Shortness of breath 08/31/2020  . Primary osteoarthritis of both hips 08/20/2020  . Hair loss 08/14/2020  . Chronic hip pain (Bilateral) 08/03/2020  . Enthesopathy hip region (Left) 08/03/2020  . Lumbar radiculitis (L1/L2) (Right) 05/11/2020  . Chronic groin pain (Right) 05/11/2020  . Osteoarthritis of hip (Right) 02/20/2020  . Chronic hip pain (Right) 02/11/2020  . Acute postoperative pain 01/07/2020   . Spondylosis without myelopathy or radiculopathy, lumbosacral region 01/16/2019  . Lumbar spondylosis 10/17/2018  . Abnormal MRI, lumbar spine (03/22/2018) 09/03/2018  . Lumbar facet hypertrophy (Multilevel) (Bilateral) 09/03/2018  . Lumbar facet syndrome (Bilateral) 09/03/2018  . DDD (degenerative disc disease), lumbosacral 09/03/2018  . Spondylolisthesis (8 mm) at L5-S1 level 09/03/2018  . Lumbar Grade 1 Retrolisthesis (2 mm) of L2/L3 09/03/2018  . L5-S1 pars defect w/ spondylolisthesis (Bilateral) 09/03/2018  . Lumbar pars defect (L5-S1) (Bilateral) 09/03/2018  . Lumbar foraminal stenosis (Left: L2-3) (Bilateral: L5-S1) 09/03/2018  . Osteoarthritis of sacroiliac joint (Bilateral) 09/03/2018  . Chronic musculoskeletal pain 09/03/2018  . Neurogenic pain 09/03/2018  . Osteoarthritis of facet joint of lumbar spine 09/03/2018  . Greater trochanteric bursitis (Bilateral) 09/03/2018  . Elevated sed rate 08/21/2018  . Chronic low back pain (1ry area of Pain) (Bilateral) (R>L) w/o sciatica 08/16/2018  . Chronic sacroiliac joint pain (2ry area of Pain) (Right) 08/16/2018  . Chronic pain syndrome 08/16/2018  . Long term current use of opiate analgesic 08/16/2018  . Pharmacologic therapy 08/16/2018  . Disorder of skeletal system 08/16/2018  . Problems influencing health status 08/16/2018  . Premature ovarian failure 08/06/2018  . Addison's disease ([place]) 04/30/2018  . Class 1 obesity due to excess calories without serious comorbidity with body mass index (BMI) of 31.0 to 31.9 in adult 04/30/2018  . Chronic midline low back pain with bilateral sciatica 04/30/2018    Past Surgical History:  Procedure Laterality Date  .  CHOLECYSTECTOMY    . POLYPECTOMY      Prior to Admission medications   Medication Sig Start Date End Date Taking? Authorizing Provider  azelastine (ASTELIN) 0.1 % nasal spray Place 1 spray into both nostrils 2 (two) times daily.   Yes [provider]   fludrocortisone (FLORINEF) 0.1 MG tablet TAKE 1/2 TABLET BY MOUTH DAILY Patient taking differently: Take 0.05 mg by mouth daily. 09/23/20  Yes Shamleffer, Melanie Crazier, MD  fluticasone (FLONASE SENSIMIST) 27.5 MCG/SPRAY nasal spray Place 2 sprays into the nose daily as needed for rhinitis.   Yes [provider]  HYDROcodone-acetaminophen (NORCO) 7.5-325 MG tablet Take 1 tablet by mouth every 8 (eight) hours as needed for severe pain. Must last 30 days. 09/08/20 10/08/20 Yes Milinda Pointer, MD  hydrocortisone (CORTEF) 10 MG tablet TAKE 1 & 1/2 TABLETS (15 MG) BY MOUTH DAILY WITH BREAKFAST AND 1/2 TABLETS (5 MG) DAILY (IN THE EVENING). Patient taking differently: Take 5-15 mg by mouth See admin instructions. Take 1 tablets (15mg ) by mouth every morning and take  tablet (5mg ) by mouth every evening 02/24/20  Yes Shamleffer, Melanie Crazier, MD  levocetirizine (XYZAL) 5 MG tablet Take 5 mg by mouth every evening.   Yes [provider]  meloxicam (MOBIC) 15 MG tablet Take 1 tablet (15 mg total) by mouth daily. 05/13/20 11/09/20 Yes Elby Beck, FNP  pseudoephedrine (SUDAFED) 30 MG tablet Take 30 mg by mouth every 12 (twelve) hours as needed for congestion.    Yes [provider]  tiZANidine (ZANAFLEX) 4 MG capsule Take 1 capsule (4 mg total) by mouth 3 (three) times daily. Patient taking differently: Take 4 mg by mouth 3 (three) times daily as needed for muscle spasms. 05/21/20  Yes Elby Beck, FNP  HYDROcodone-acetaminophen (NORCO) 7.5-325 MG tablet Take 1 tablet by mouth every 8 (eight) hours as needed for severe pain. Must last 30 days. 08/09/20 09/08/20  Milinda Pointer, MD  HYDROcodone-acetaminophen (NORCO) 7.5-325 MG tablet Take 1 tablet by mouth every 8 (eight) hours as needed for severe pain. Must last 30 days. 10/08/20 11/07/20  Milinda Pointer, MD    Allergies Lisinopril, Ultram [tramadol], and Sulfa antibiotics  Family History  Problem Relation  Age of Onset  . Arthritis Mother   . Hyperlipidemia Mother   . Hypertension Mother   . Alcohol abuse Father   . Hearing loss Father   . Hyperlipidemia Father     Social History Social History   Tobacco Use  . Smoking status: Former Research scientist (life sciences)  . Smokeless tobacco: Never Used  Vaping Use  . Vaping Use: Never used  Substance Use Topics  . Alcohol use: Yes    Comment: occ  . Drug use: Never    Review of Systems Constitutional: No fever but some chills Eyes: No visual changes ENT: No sore throat. Cardiovascular: Denies chest pain. Respiratory: Denies shortness of breath. Gastrointestinal: See HPI Genitourinary: Negative for dysuria.  Slight blood in urine Musculoskeletal: Negative for back pain. Skin: Negative for rash. Neurological: Negative for headaches, areas of focal weakness or numbness.    ____________________________________________   PHYSICAL EXAM:  VITAL SIGNS: ED Triage Vitals  Enc Vitals Group     BP 10/06/20 0934 121/83     Pulse Rate 10/06/20 0934 (!) 128     Resp 10/06/20 0934 16     Temp 10/06/20 0934 98.6 F (37 C)     Temp Source 10/06/20 0934 Oral     SpO2 10/06/20 0934 99 %  Weight 10/06/20 0935 174 lb 2.6 oz (79 kg)     Height 10/06/20 0935 5\' 4"  (1.626 m)     Head Circumference --      Peak Flow --      Pain Score 10/06/20 0934 4     Pain Loc --      Pain Edu? --      Excl. in [place]? --     Constitutional: Alert and oriented.  Mildly ill-appearing, seems nauseated and pain.  Eyes: Conjunctivae are normal. Head: Atraumatic. Nose: No congestion/rhinnorhea. Mouth/Throat: Mucous membranes are moist. Neck: No stridor.  Cardiovascular: Moderately tachycardic rate, regular rhythm. Grossly normal heart sounds.  Good peripheral circulation. Respiratory: Normal respiratory effort except slightly increased respiratory rate.  No retractions. Lungs CTAB. Gastrointestinal: Soft and nontender except endorses a little bit of discomfort in the left  flank, mild left CVA tenderness, none on the right. No distention. Musculoskeletal: No lower extremity tenderness nor edema. Neurologic:  Normal speech and language. No gross focal neurologic deficits are appreciated.  Skin:  Skin is warm, dry and intact. No rash noted. Psychiatric: Mood and affect are normal. Speech and behavior are normal.  ____________________________________________   LABS (all labs ordered are listed, but only abnormal results are displayed)  Labs Reviewed  COMPREHENSIVE METABOLIC PANEL - Abnormal; Notable for the following components:      Result Value   Potassium 3.2 (*)    Glucose, Bld 102 (*)    BUN 25 (*)    Creatinine, Ser 1.03 (*)    Total Bilirubin 1.9 (*)    All other components within normal limits  CBC - Abnormal; Notable for the following components:   WBC 11.5 (*)    Platelets 139 (*)    All other components within normal limits  URINALYSIS, COMPLETE (UACMP) WITH MICROSCOPIC - Abnormal; Notable for the following components:   Color, Urine AMBER (*)    APPearance CLOUDY (*)    Hgb urine dipstick LARGE (*)    Protein, ur 100 (*)    Leukocytes,Ua LARGE (*)    RBC / HPF >50 (*)    WBC, UA >50 (*)    Bacteria, UA MANY (*)    All other components within normal limits  URINE CULTURE  SARS CORONAVIRUS 2 (TAT 6-24 HRS)  CULTURE, BLOOD (ROUTINE X 2)  CULTURE, BLOOD (ROUTINE X 2)  LIPASE, BLOOD  LACTIC ACID, PLASMA  HIV ANTIBODY (ROUTINE TESTING W REFLEX)  POC URINE PREG, ED   ____________________________________________  EKG   ____________________________________________  RADIOLOGY  CT Renal Stone Study  Result Date: 10/06/2020 CLINICAL DATA:  Left lower abdominal/flank and back pain that started last night at 11 p.m., kidney stone suspected. EXAM: CT ABDOMEN AND PELVIS WITHOUT CONTRAST TECHNIQUE: Multidetector CT imaging of the abdomen and pelvis was performed following the standard protocol without IV contrast. COMPARISON:  None.  FINDINGS: Lower chest: No acute abnormality. Normal size heart. No pericardial effusion. Hepatobiliary: Unremarkable noncontrast appearance of the hepatic parenchyma. Gallbladder surgically absent. No biliary ductal dilation. Pancreas: Unremarkable Spleen: Unremarkable Adrenals/Urinary Tract: Bilateral adrenal glands are unremarkable. Nonobstructive right lower pole renal calculus which measures 1.1 cm on image 76/5. Nonobstructive staghorn type left upper pole renal calculus which measures 3.0 cm on image 87/5. There is perinephric and proximal periureteric stranding on the left. Urinary bladder is grossly unremarkable for degree of distension. Stomach/Bowel: Stomach is within normal limits. Appendix appears normal. Colonic diverticulosis without findings of acute diverticulitis. No evidence of bowel wall thickening, distention,  or inflammatory changes. Vascular/Lymphatic: Aortic atherosclerosis. No enlarged abdominal or pelvic lymph nodes. Reproductive: Uterus and right adnexa are unremarkable. There is a 2.6 cm left adnexal cyst. No follow-up imaging recommended. Note: This recommendation does not apply to premenarchal patients and to those with increased risk (genetic, family history, elevated tumor markers or other high-risk factors) of ovarian cancer. Reference: JACR 2020 Feb; 17(2):248-254 Other: No large volume abdominopelvic ascites. Musculoskeletal: Multilevel degenerative changes spine. T10 and L3 vertebral body hemangiomas. Bilateral L5 pars defects with grade 1 anterolisthesis of L5 on S1. No acute osseous abnormality. IMPRESSION: 1. Nonobstructive staghorn type LEFT upper pole renal calculus which measures 3.0 cm, with perinephric and proximal periureteric stranding on the LEFT. Findings could be secondary to a recently passed stone or urinary tract infection. Correlate with urinalysis. 2. Nonobstructive right nephrolithiasis. 3. Colonic diverticulosis without findings of acute diverticulitis. 4.  Bilateral L5 pars defects with grade 1 anterolisthesis of L5 on S1. 5. Aortic atherosclerosis.  Aortic Atherosclerosis (ICD10-I70.0). Electronically Signed   By: Dahlia Bailiff MD   On: 10/06/2020 11:49    CT imaging reviewed, discussed with Dr. Bernardo Heater.  Concerning for inflammatory changes around the left kidney, staghorn calculus.  No obstructive finding ____________________________________________   PROCEDURES  Procedure(s) performed: None  Procedures  Critical Care performed: no   ____________________________________________   INITIAL IMPRESSION / ASSESSMENT AND PLAN / ED COURSE  Pertinent labs & imaging results that were available during my care of the patient were reviewed by me and considered in my medical decision making (see chart for details).   Patient presents for evaluation of left flank pain associated with possibly some blood in her urine, symptoms seem very urologic in nature with a rather sharp pain associated with rapid nausea vomiting.  Tachycardia and some tachypnea along with urinary evaluation that demonstrates evidence of UTI and pyelonephritis by clinical history and exam.  Clinical Course as of 10/06/20 [place] Oct 06, 2020  1159 Urology paged [MQ]  1217 Paged urology [MQ]  1319 Urology is notified me that they are in the OR, will call back after surgery [MQ]  1415 Discussed with Dr. Bernardo Heater of urology.  He recommends treatment with IV antibiotics, based on current imaging reports likely no acute urologic intervention but would be happy to consult on patient.  I discussed with the patient and she does have some evidence of sepsis now with persistent tachycardia, elevated respiratory rate and obvious infection.  Discussed with patient observation versus home with antibiotics and close return precautions, patient reports pain again recurring and she would like to be hospitalized.  I think this is reasonable and we will request admission to hospitalist service.   Diagnosis pyelonephritis with associated left-sided staghorn calculus [MQ]    Clinical Course User Index [MQ] Delman Kitten, MD   Differential diagnosis includes but is not limited to, abdominal perforation, aortic dissection, cholecystitis, appendicitis, diverticulitis, colitis, esophagitis/gastritis, kidney stone, pyelonephritis, urinary tract infection, aortic aneurysm. All are considered in decision and treatment plan. Based upon the patient's presentation and risk factors, pain control, CT imaging and urine urinalysis ordered.  ____________________________________________   FINAL CLINICAL IMPRESSION(S) / ED DIAGNOSES  Final diagnoses:  Pyelonephritis  Staghorn calculus  Sepsis, due to unspecified organism, unspecified whether acute organ dysfunction present [facility])        Note:  This document was prepared using Dragon voice recognition software and may include unintentional dictation errors       Delman Kitten, MD 10/06/20 1525

## 2020-10-06 NOTE — Progress Notes (Signed)
CODE SEPSIS - PHARMACY COMMUNICATION  **[facility] Spectrum Antibiotics should be administered within 1 hour of Sepsis diagnosis**  Time Code Sepsis Called/Page Received: 1418  Antibiotics Ordered: ceftriaxone  Time of 1st antibiotic administration: 1048    Tawnya Crook ,PharmD Clinical Pharmacist  10/06/2020  2:17 PM

## 2020-10-06 NOTE — H&P (Signed)
History and Physical   Denise Burns AOZ:[id number] DOB: Jul 27, 1971 DOA: 10/06/2020  PCP: Elby Beck, FNP (Inactive)  Outpatient Specialists: Dr. Dossie Arbour, pain management Patient coming from: home   I have personally briefly reviewed patient's old medical records in [place].  Chief Concern: Back pain  HPI: Denise Burns is a 50 y.o. female with medical history significant for nonseasonal allergic rhinitis, Addison's disease, chronic midline low back pain with bilateral sciatica, chronic right sacroiliac joint pain, spondylolithiasis, on chronic opioids, premature ovarian failure, presented to the emergency department for chief concerns of left lower back pain.  She reports the pain started approximately 11 PM on 10/05/2020, lasting 45 minutes, described as dull pain.  She endorses associated nausea and vomiting.  She states the vomitus was orange in color, she did just have pizza for her birthday.  She denies fever.  She endorses chills.  She reports that she has never felt this way before.  At bedside in the emergency department, patient does not have the pain anymore.  She is responding appropriately to the IV morphine given by EDP.  Vaccination: Patient is fully vaccinated for COVID-19  ROS: Constitutional: no weight change, no fever ENT/Mouth: no sore throat, no rhinorrhea Eyes: no eye pain, no vision changes Cardiovascular: no chest pain, no dyspnea,  no edema, no palpitations Respiratory: no cough, no sputum, no wheezing Gastrointestinal: no nausea, no vomiting, no diarrhea, no constipation Genitourinary: no urinary incontinence, no dysuria, no hematuria Musculoskeletal: no arthralgias, left back pain Skin: no skin lesions, no pruritus, Neuro: + weakness, no loss of consciousness, no syncope Psych: no anxiety, no depression, no decrease appetite Heme/Lymph: no bruising, no bleeding  ED Course: The provider, patient requiring hospitalization due to  pyelonephritis.  Vitals in the ED had a temperature of 98.6, respiration rate of 29, heart rate of 104, blood pressure 115/54, satting at 96% on room air.  Assessment/Plan  Active Problems:   Pyelonephritis   Meets sepsis criteria source is urine Pyelonephritis -WBC 11.5, increased heart rate, increased respiration rate -Blood cultures x2  -Lactic acid 1.4 initially, will continue to follow -Continue ceftriaxone IV -Status post LR 1 L bolus and normal saline 1 L bolus per EDP -LR IVF 125 mL/h for 1 day, to start after bolus completion -Urine culture  Staghorn calculus on the left side-patient was previously not aware of this -Neurology has been consulted, Dr. Elenor Quinones  Addison's disease-resumed home hydrocortisone 10 mg twice daily and fludrocortisone 50 mcg daily  Chronic pain syndrome-holding home p.o. pain meds -Resumed home tizanidine 4 mg 3 times daily  Chart reviewed.   DVT prophylaxis: Enoxaparin 40 mg subcutaneous every 24 hours Code Status: Full code Diet: Heart healthy Family Communication: No Disposition Plan: Pending clinical course Consults called: Urology Admission status: observation to med surg  Past Medical History:  Diagnosis Date  . Addison disease ([place])   . Arthritis   . Back pain   . GERD (gastroesophageal reflux disease)    Past Surgical History:  Procedure Laterality Date  . CHOLECYSTECTOMY    . POLYPECTOMY     Social History:  reports that she has quit smoking. She has never used smokeless tobacco. She reports current alcohol use. She reports that she does not use drugs.  Allergies  Allergen Reactions  . Lisinopril Cough  . Ultram [Tramadol] Other (See Comments)  . Sulfa Antibiotics Other (See Comments)   Family History  Problem Relation Age of Onset  . Arthritis Mother   . Hyperlipidemia  Mother   . Hypertension Mother   . Alcohol abuse Father   . Hearing loss Father   . Hyperlipidemia Father    Family history: Family history  reviewed and not pertinent  Prior to Admission medications   Medication Sig Start Date End Date Taking? Authorizing Provider  azelastine (ASTELIN) 0.1 % nasal spray Place 2 sprays into both nostrils 2 (two) times daily. Use in each nostril as directed    [provider]  fludrocortisone (FLORINEF) 0.1 MG tablet TAKE 1/2 TABLET BY MOUTH DAILY 09/23/20   Shamleffer, Melanie Crazier, MD  HYDROcodone-acetaminophen (NORCO) 7.5-325 MG tablet Take 1 tablet by mouth every 8 (eight) hours as needed for severe pain. Must last 30 days. 08/09/20 09/08/20  Milinda Pointer, MD  HYDROcodone-acetaminophen (NORCO) 7.5-325 MG tablet Take 1 tablet by mouth every 8 (eight) hours as needed for severe pain. Must last 30 days. 09/08/20 10/08/20  Milinda Pointer, MD  HYDROcodone-acetaminophen (NORCO) 7.5-325 MG tablet Take 1 tablet by mouth every 8 (eight) hours as needed for severe pain. Must last 30 days. 10/08/20 11/07/20  Milinda Pointer, MD  hydrocortisone (CORTEF) 10 MG tablet TAKE 1 & 1/2 TABLETS (15 MG) BY MOUTH DAILY WITH BREAKFAST AND 1/2 TABLETS (5 MG) DAILY (IN THE EVENING). 02/24/20   Shamleffer, Melanie Crazier, MD  levocetirizine (XYZAL) 5 MG tablet Take 5 mg by mouth every evening.    [provider]  meloxicam (MOBIC) 15 MG tablet Take 1 tablet (15 mg total) by mouth daily. 05/13/20 11/09/20  Elby Beck, FNP  pseudoephedrine (SUDAFED) 30 MG tablet Take 30 mg by mouth every 12 (twelve) hours as needed for congestion.     [provider]  tiZANidine (ZANAFLEX) 4 MG capsule Take 1 capsule (4 mg total) by mouth 3 (three) times daily. 05/21/20   Elby Beck, FNP   Physical Exam: Vitals:   10/06/20 1100 10/06/20 1208 10/06/20 1300 10/06/20 1330  BP: 119/76 124/77 (!) 114/55 (!) 115/54  Pulse: (!) 110 (!) 109 (!) 106 (!) 104  Resp: (!) 23 (!) 22 (!) 26 (!) 29  Temp:      TempSrc:      SpO2: 96% 99% 96% 96%  Weight:      Height:       Constitutional: appears  age-appropriate, NAD, calm, comfortable Eyes: PERRL, lids and conjunctivae normal ENMT: Mucous membranes are moist. Age-appropriate dentition. Hearing appropriate Neck: normal, supple, no masses, no thyromegaly Respiratory: clear to auscultation bilaterally, no wheezing, no crackles. Normal respiratory effort. No accessory muscle use.  Cardiovascular: Sinus tachycardia, regular rhythm, no murmurs / rubs / gallops. No extremity edema. 2+ pedal pulses. No carotid bruits.  Abdomen: Obese abdomen, no tenderness, no masses palpated, no hepatosplenomegaly. Bowel sounds positive.  Musculoskeletal: no clubbing / cyanosis. No joint deformity upper and lower extremities. Good ROM, no contractures, no atrophy. Normal muscle tone.  Skin: no rashes, lesions, ulcers. No induration Neurologic: Sensation intact. Strength 5/5 in all 4.  Psychiatric: Normal judgment and insight. Alert and oriented x 3. Normal mood.   EKG: independently reviewed, showing sinus tachycardia with rate of 113, QTC 427  Chest x-ray on Admission: I personally reviewed and I agree with radiologist reading as below.  CT Renal Stone Study  Result Date: 10/06/2020 CLINICAL DATA:  Left lower abdominal/flank and back pain that started last night at 11 p.m., kidney stone suspected. EXAM: CT ABDOMEN AND PELVIS WITHOUT CONTRAST TECHNIQUE: Multidetector CT imaging of the abdomen and pelvis was performed following the standard  protocol without IV contrast. COMPARISON:  None. FINDINGS: Lower chest: No acute abnormality. Normal size heart. No pericardial effusion. Hepatobiliary: Unremarkable noncontrast appearance of the hepatic parenchyma. Gallbladder surgically absent. No biliary ductal dilation. Pancreas: Unremarkable Spleen: Unremarkable Adrenals/Urinary Tract: Bilateral adrenal glands are unremarkable. Nonobstructive right lower pole renal calculus which measures 1.1 cm on image 76/5. Nonobstructive staghorn type left upper pole renal calculus  which measures 3.0 cm on image 87/5. There is perinephric and proximal periureteric stranding on the left. Urinary bladder is grossly unremarkable for degree of distension. Stomach/Bowel: Stomach is within normal limits. Appendix appears normal. Colonic diverticulosis without findings of acute diverticulitis. No evidence of bowel wall thickening, distention, or inflammatory changes. Vascular/Lymphatic: Aortic atherosclerosis. No enlarged abdominal or pelvic lymph nodes. Reproductive: Uterus and right adnexa are unremarkable. There is a 2.6 cm left adnexal cyst. No follow-up imaging recommended. Note: This recommendation does not apply to premenarchal patients and to those with increased risk (genetic, family history, elevated tumor markers or other high-risk factors) of ovarian cancer. Reference: JACR 2020 Feb; 17(2):248-254 Other: No large volume abdominopelvic ascites. Musculoskeletal: Multilevel degenerative changes spine. T10 and L3 vertebral body hemangiomas. Bilateral L5 pars defects with grade 1 anterolisthesis of L5 on S1. No acute osseous abnormality. IMPRESSION: 1. Nonobstructive staghorn type LEFT upper pole renal calculus which measures 3.0 cm, with perinephric and proximal periureteric stranding on the LEFT. Findings could be secondary to a recently passed stone or urinary tract infection. Correlate with urinalysis. 2. Nonobstructive right nephrolithiasis. 3. Colonic diverticulosis without findings of acute diverticulitis. 4. Bilateral L5 pars defects with grade 1 anterolisthesis of L5 on S1. 5. Aortic atherosclerosis.  Aortic Atherosclerosis (ICD10-I70.0). Electronically Signed   By: Dahlia Bailiff MD   On: 10/06/2020 11:49   Labs on Admission: I have personally reviewed following labs  CBC: Recent Labs  Lab 10/06/20 0938  WBC 11.5*  HGB 13.7  HCT 40.8  MCV 94.4  PLT 390*   Basic Metabolic Panel: Recent Labs  Lab 10/06/20 0938  NA 141  K 3.2*  CL 109  CO2 24  GLUCOSE 102*  BUN  25*  CREATININE 1.03*  CALCIUM 9.1   GFR: Estimated Creatinine Clearance: 66.4 mL/min (A) (by C-G formula based on SCr of 1.03 mg/dL (H)).  Liver Function Tests: Recent Labs  Lab 10/06/20 0938  AST 38  ALT 34  ALKPHOS 68  BILITOT 1.9*  PROT 6.8  ALBUMIN 3.8   Recent Labs  Lab 10/06/20 0938  LIPASE 30   Urine analysis:    Component Value Date/Time   COLORURINE AMBER (A) 10/06/2020 0938   APPEARANCEUR CLOUDY (A) 10/06/2020 0938   LABSPEC 1.018 10/06/2020 0938   PHURINE 8.0 10/06/2020 0938   GLUCOSEU NEGATIVE 10/06/2020 0938   HGBUR LARGE (A) 10/06/2020 0938   BILIRUBINUR NEGATIVE 10/06/2020 0938   KETONESUR NEGATIVE 10/06/2020 0938   PROTEINUR 100 (A) 10/06/2020 0938   NITRITE NEGATIVE 10/06/2020 0938   LEUKOCYTESUR LARGE (A) 10/06/2020 0938   Denise Burns N Bennie Chirico D.O. [facility]  If 7PM-7AM, please contact overnight-coverage provider If 7AM-7PM, please contact day coverage provider [email or web address]  10/06/2020, 3:04 PM

## 2020-10-06 NOTE — ED Notes (Signed)
Pt to ED c/o L lower abdominal, flank and back pain that started last night at 11pm. Pt has vomited 2 times( last night). Pt had 3 episodes diarrhea today (usually has BM q 2-3 d). Pt states hardly slept last night.

## 2020-10-06 NOTE — ED Notes (Signed)
Called pharmacy. They will verify meds and retime med administration.

## 2020-10-06 NOTE — ED Notes (Signed)
Husband at bedside.  

## 2020-10-06 NOTE — Sepsis Progress Note (Signed)
The antibiotics were administered at 1048. A code Sepsis was called at 1413 and then blood cultures were collected at 1442. Verification of this was made with the bedside RN via secure chat.

## 2020-10-06 NOTE — ED Notes (Signed)
EDP speaking with pt.

## 2020-10-06 NOTE — ED Notes (Signed)
Pt ambulated to toilet in room to void. Steady gait noted.

## 2020-10-06 NOTE — ED Notes (Signed)
Pt back in bed. EDP at bedside speaking with pt re: plan of care.

## 2020-10-06 NOTE — ED Notes (Signed)
Pt to CT

## 2020-10-06 NOTE — ED Notes (Signed)
Spoke with dietary. Lunch tray is being brought now.

## 2020-10-06 NOTE — Sepsis Progress Note (Deleted)
elink is monitoring this sepsis 

## 2020-10-07 DIAGNOSIS — Z20822 Contact with and (suspected) exposure to covid-19: Secondary | ICD-10-CM | POA: Diagnosis present

## 2020-10-07 DIAGNOSIS — Z87891 Personal history of nicotine dependence: Secondary | ICD-10-CM | POA: Diagnosis not present

## 2020-10-07 DIAGNOSIS — Z8249 Family history of ischemic heart disease and other diseases of the circulatory system: Secondary | ICD-10-CM | POA: Diagnosis not present

## 2020-10-07 DIAGNOSIS — A4151 Sepsis due to Escherichia coli [E. coli]: Secondary | ICD-10-CM | POA: Diagnosis present

## 2020-10-07 DIAGNOSIS — A419 Sepsis, unspecified organism: Secondary | ICD-10-CM

## 2020-10-07 DIAGNOSIS — Z7952 Long term (current) use of systemic steroids: Secondary | ICD-10-CM | POA: Diagnosis not present

## 2020-10-07 DIAGNOSIS — Z83438 Family history of other disorder of lipoprotein metabolism and other lipidemia: Secondary | ICD-10-CM | POA: Diagnosis not present

## 2020-10-07 DIAGNOSIS — Z882 Allergy status to sulfonamides status: Secondary | ICD-10-CM | POA: Diagnosis not present

## 2020-10-07 DIAGNOSIS — M1990 Unspecified osteoarthritis, unspecified site: Secondary | ICD-10-CM | POA: Diagnosis present

## 2020-10-07 DIAGNOSIS — N12 Tubulo-interstitial nephritis, not specified as acute or chronic: Secondary | ICD-10-CM

## 2020-10-07 DIAGNOSIS — G894 Chronic pain syndrome: Secondary | ICD-10-CM | POA: Diagnosis present

## 2020-10-07 DIAGNOSIS — M5442 Lumbago with sciatica, left side: Secondary | ICD-10-CM | POA: Diagnosis present

## 2020-10-07 DIAGNOSIS — Z8261 Family history of arthritis: Secondary | ICD-10-CM | POA: Diagnosis not present

## 2020-10-07 DIAGNOSIS — Z79899 Other long term (current) drug therapy: Secondary | ICD-10-CM | POA: Diagnosis not present

## 2020-10-07 DIAGNOSIS — N200 Calculus of kidney: Secondary | ICD-10-CM | POA: Diagnosis present

## 2020-10-07 DIAGNOSIS — E271 Primary adrenocortical insufficiency: Secondary | ICD-10-CM | POA: Diagnosis present

## 2020-10-07 DIAGNOSIS — Z888 Allergy status to other drugs, medicaments and biological substances status: Secondary | ICD-10-CM | POA: Diagnosis not present

## 2020-10-07 DIAGNOSIS — M5441 Lumbago with sciatica, right side: Secondary | ICD-10-CM | POA: Diagnosis present

## 2020-10-07 DIAGNOSIS — K219 Gastro-esophageal reflux disease without esophagitis: Secondary | ICD-10-CM | POA: Diagnosis present

## 2020-10-07 DIAGNOSIS — Z885 Allergy status to narcotic agent status: Secondary | ICD-10-CM | POA: Diagnosis not present

## 2020-10-07 DIAGNOSIS — Z811 Family history of alcohol abuse and dependence: Secondary | ICD-10-CM | POA: Diagnosis not present

## 2020-10-07 DIAGNOSIS — Z791 Long term (current) use of non-steroidal anti-inflammatories (NSAID): Secondary | ICD-10-CM | POA: Diagnosis not present

## 2020-10-07 DIAGNOSIS — R1032 Left lower quadrant pain: Secondary | ICD-10-CM | POA: Diagnosis present

## 2020-10-07 LAB — CBC
HCT: 34.9 % — ABNORMAL LOW (ref 36.0–46.0)
Hemoglobin: 11.4 g/dL — ABNORMAL LOW (ref 12.0–15.0)
MCH: 31.6 pg (ref 26.0–34.0)
MCHC: 32.7 g/dL (ref 30.0–36.0)
MCV: 96.7 fL (ref 80.0–100.0)
Platelets: 114 10*3/uL — ABNORMAL LOW (ref 150–400)
RBC: 3.61 MIL/uL — ABNORMAL LOW (ref 3.87–5.11)
RDW: 13 % (ref 11.5–15.5)
WBC: 7.3 10*3/uL (ref 4.0–10.5)
nRBC: 0 % (ref 0.0–0.2)

## 2020-10-07 LAB — BASIC METABOLIC PANEL
Anion gap: 4 — ABNORMAL LOW (ref 5–15)
BUN: 11 mg/dL (ref 6–20)
CO2: 26 mmol/L (ref 22–32)
Calcium: 8.1 mg/dL — ABNORMAL LOW (ref 8.9–10.3)
Chloride: 110 mmol/L (ref 98–111)
Creatinine, Ser: 0.69 mg/dL (ref 0.44–1.00)
GFR, Estimated: >60 mL/min (ref >60)
Glucose, Bld: 139 mg/dL — ABNORMAL HIGH (ref 70–99)
Potassium: 3.5 mmol/L (ref 3.5–5.1)
Sodium: 140 mmol/L (ref 135–145)

## 2020-10-07 LAB — SARS CORONAVIRUS 2 (TAT 6-24 HRS): SARS Coronavirus 2: NEGATIVE

## 2020-10-07 LAB — HIV ANTIBODY (ROUTINE TESTING W REFLEX): HIV Screen 4th Generation wRfx: NONREACTIVE

## 2020-10-07 MED ORDER — TIZANIDINE HCL 4 MG PO TABS
4.0000 mg | ORAL_TABLET | Freq: Three times a day (TID) | ORAL | Status: DC | PRN
  Administered 2020-10-08: 4 mg via ORAL
  Filled 2020-10-07 (×3): qty 1

## 2020-10-07 MED ORDER — HYDROCORTISONE 5 MG PO TABS
15.0000 mg | ORAL_TABLET | Freq: Every day | ORAL | Status: DC
  Administered 2020-10-08: 15 mg via ORAL
  Filled 2020-10-07: qty 1

## 2020-10-07 MED ORDER — HYDROCORTISONE 5 MG PO TABS
5.0000 mg | ORAL_TABLET | Freq: Every day | ORAL | Status: DC
  Administered 2020-10-08: 5 mg via ORAL
  Filled 2020-10-07: qty 1

## 2020-10-07 NOTE — Plan of Care (Signed)
Patient is running a low grade temp of 99.8. Will give tylenol at appropriate time.  Denise Burns

## 2020-10-07 NOTE — Progress Notes (Signed)
PROGRESS NOTE    Denise Burns  KCM:[id number] DOB: 1970-08-03 DOA: 10/06/2020 PCP: Denise Beck, FNP (Inactive)   Brief Narrative: Taken from H&P. Denise Burns is a 50 y.o. female with medical history significant for nonseasonal allergic rhinitis, Addison's disease, chronic midline low back pain with bilateral sciatica, chronic right sacroiliac joint pain, spondylolithiasis, on chronic opioids, premature ovarian failure, presented to the emergency department for chief concerns of left lower back pain. Admitted for sepsis secondary to UTI/pyelonephritis with bilateral nonobstructing nephrolithiasis.  Started on ceftriaxone and urology was consulted, they were recommending outpatient follow-up once infection is cleared.  Subjective: Patient continued to have mild left CVA pain radiating to the left lower quadrant, stating it is much improved than yesterday.  No nausea or vomiting.  She was also having some headache.  Assessment & Plan:   Active Problems:   Pyelonephritis  Sepsis secondary to UTI/pyelonephritis.  Initially met sepsis criteria with leukocytosis, tachycardia and tachypnea.  Blood cultures remain negative.  Urine cultures growing more than 100,000 colonies of gram-negative rod. -Continue with ceftriaxone-we will de-escalate once susceptibility results available.  Bilateral nonobstructing nephrolithiasis.  Urology was consulted due to nonobstructing staghorn calculus on left.  Appreciate their recommendations, they are recommending outpatient follow-up once infection is cleared for definitive treatment of her bilateral nephrolithiasis.  History of Addison's disease.  Appears stable -Continue home dose of fludrocortisone and hydrocortisone.  Chronic pain syndrome. -Continue home tizanidine.  Objective: Vitals:   10/07/20 0127 10/07/20 0433 10/07/20 0803 10/07/20 1233  BP:  (!) 98/52 128/85 121/69  Pulse:  65 87 93  Resp:  20  16  Temp: 99.8 F (37.7 C) 98.6 F (37  C) 98.7 F (37.1 C) 99.3 F (37.4 C)  TempSrc: Oral Oral Oral   SpO2:  93% 100% 95%  Weight:      Height:        Intake/Output Summary (Last 24 hours) at 10/07/2020 1524 Last data filed at 10/07/2020 1300 Gross per 24 hour  Intake 1360 ml  Output 2700 ml  Net -1340 ml   Filed Weights   10/06/20 0935 10/06/20 1954  Weight: 79 kg 86.6 kg    Examination:  General exam: Appears calm and comfortable  Respiratory system: Clear to auscultation. Respiratory effort normal. Cardiovascular system: S1 & S2 heard, RRR.  Gastrointestinal system: Soft, nontender, nondistended, bowel sounds positive, mild left CVA tenderness. Central nervous system: Alert and oriented. No focal neurological deficits. Extremities: No edema, no cyanosis, pulses intact and symmetrical. Psychiatry: Judgement and insight appear normal. Mood & affect appropriate.    DVT prophylaxis: Lovenox Code Status: Full Family Communication: Discussed with patient Disposition Plan:  Status is: Inpatient  Remains inpatient appropriate because:Inpatient level of care appropriate due to severity of illness   Dispo: The patient is from: Home              Anticipated d/c is to: Home              Patient currently is not medically stable to d/c.   Difficult to place patient No               Level of care: Med-Surg  All the records are reviewed and case discussed with Care Management/Social Worker. Management plans discussed with the patient, nursing and they are in agreement.  Consultants:   Urology  Procedures:  Antimicrobials: Ceftriaxone  Data Reviewed: I have personally reviewed following labs and imaging studies  CBC: Recent Labs  Lab 10/06/20 [phone]  10/07/20 0535  WBC 11.5* 7.3  HGB 13.7 11.4*  HCT 40.8 34.9*  MCV 94.4 96.7  PLT 139* 174*   Basic Metabolic Panel: Recent Labs  Lab 10/06/20 0938 10/07/20 0535  NA 141 140  K 3.2* 3.5  CL 109 110  CO2 24 26  GLUCOSE 102* 139*  BUN 25* 11   CREATININE 1.03* 0.69  CALCIUM 9.1 8.1*   GFR: Estimated Creatinine Clearance: 89.6 mL/min (by C-G formula based on SCr of 0.69 mg/dL). Liver Function Tests: Recent Labs  Lab 10/06/20 0938  AST 38  ALT 34  ALKPHOS 68  BILITOT 1.9*  PROT 6.8  ALBUMIN 3.8   Recent Labs  Lab 10/06/20 0938  LIPASE 30   No results for input(s): AMMONIA in the last 168 hours. Coagulation Profile: No results for input(s): INR, PROTIME in the last 168 hours. Cardiac Enzymes: No results for input(s): CKTOTAL, CKMB, CKMBINDEX, TROPONINI in the last 168 hours. BNP (last 3 results) No results for input(s): PROBNP in the last 8760 hours. HbA1C: No results for input(s): HGBA1C in the last 72 hours. CBG: No results for input(s): GLUCAP in the last 168 hours. Lipid Profile: No results for input(s): CHOL, HDL, LDLCALC, TRIG, CHOLHDL, LDLDIRECT in the last 72 hours. Thyroid Function Tests: No results for input(s): TSH, T4TOTAL, FREET4, T3FREE, THYROIDAB in the last 72 hours. Anemia Panel: No results for input(s): VITAMINB12, FOLATE, FERRITIN, TIBC, IRON, RETICCTPCT in the last 72 hours. Sepsis Labs: Recent Labs  Lab 10/06/20 1441  LATICACIDVEN 1.4    Recent Results (from the past 240 hour(s))  Urine Culture     Status: Abnormal (Preliminary result)   Collection Time: 10/06/20  9:38 AM   Specimen: Urine, Random  Result Value Ref Range Status   Specimen Description   Final    URINE, RANDOM Performed at [facility], [address] [place], [place] [address]    Special Requests   Final    NONE Performed at [facility], [place], [place] [address]    Culture >=100,000 COLONIES/mL GRAM NEGATIVE RODS (A)  Final   Report Status PENDING  Incomplete  SARS CORONAVIRUS 2 (TAT 6-24 HRS) Nasopharyngeal Nasopharyngeal Swab     Status: None   Collection Time: 10/06/20  2:41 PM   Specimen: Nasopharyngeal Swab  Result Value Ref Range Status   SARS Coronavirus 2  NEGATIVE NEGATIVE Final    Comment: (NOTE) SARS-CoV-2 target nucleic acids are NOT DETECTED.  The SARS-CoV-2 RNA is generally detectable in upper and lower respiratory specimens during the acute phase of infection. Negative results do not preclude SARS-CoV-2 infection, do not rule out co-infections with other pathogens, and should not be used as the sole basis for treatment or other patient management decisions. Negative results must be combined with clinical observations, patient history, and epidemiological information. The expected result is Negative.  Fact Sheet for Patients: SugarRoll.be  Fact Sheet for Healthcare Providers: https://www.woods-mathews.com/  This test is not yet approved or cleared by the [place] FDA and  has been authorized for detection and/or diagnosis of SARS-CoV-2 by FDA under an Emergency Use Authorization (EUA). This EUA will remain  in effect (meaning this test can be used) for the duration of the COVID-19 declaration under Se ction 564(b)(1) of the Act, 21 U.S.C. section 360bbb-3(b)(1), unless the authorization is terminated or revoked sooner.  Performed at [facility], [place] [address] [place], [place] [address]   Culture, blood (Routine X 2) w Reflex  to ID Panel     Status: None (Preliminary result)   Collection Time: 10/06/20  2:42 PM   Specimen: BLOOD  Result Value Ref Range Status   Specimen Description BLOOD LEFT ANTECUBITAL  Final   Special Requests   Final    BOTTLES DRAWN AEROBIC AND ANAEROBIC Blood Culture results may not be optimal due to an excessive volume of blood received in culture bottles   Culture   Final    NO GROWTH < 24 HOURS Performed at [facility], [address], [place], [place] [address]    Report Status PENDING  Incomplete  Culture, blood (Routine X 2) w Reflex to ID Panel     Status: None (Preliminary result)   Collection Time: 10/06/20  2:42 PM    Specimen: BLOOD  Result Value Ref Range Status   Specimen Description BLOOD BLOOD RIGHT WRIST  Final   Special Requests   Final    BOTTLES DRAWN AEROBIC AND ANAEROBIC Blood Culture results may not be optimal due to an excessive volume of blood received in culture bottles   Culture   Final    NO GROWTH < 24 HOURS Performed at [facility], [address], [place], [place] [address]    Report Status PENDING  Incomplete     Radiology Studies: CT Renal Stone Study  Result Date: 10/06/2020 CLINICAL DATA:  Left lower abdominal/flank and back pain that started last night at 11 p.m., kidney stone suspected. EXAM: CT ABDOMEN AND PELVIS WITHOUT CONTRAST TECHNIQUE: Multidetector CT imaging of the abdomen and pelvis was performed following the standard protocol without IV contrast. COMPARISON:  None. FINDINGS: Lower chest: No acute abnormality. Normal size heart. No pericardial effusion. Hepatobiliary: Unremarkable noncontrast appearance of the hepatic parenchyma. Gallbladder surgically absent. No biliary ductal dilation. Pancreas: Unremarkable Spleen: Unremarkable Adrenals/Urinary Tract: Bilateral adrenal glands are unremarkable. Nonobstructive right lower pole renal calculus which measures 1.1 cm on image 76/5. Nonobstructive staghorn type left upper pole renal calculus which measures 3.0 cm on image 87/5. There is perinephric and proximal periureteric stranding on the left. Urinary bladder is grossly unremarkable for degree of distension. Stomach/Bowel: Stomach is within normal limits. Appendix appears normal. Colonic diverticulosis without findings of acute diverticulitis. No evidence of bowel wall thickening, distention, or inflammatory changes. Vascular/Lymphatic: Aortic atherosclerosis. No enlarged abdominal or pelvic lymph nodes. Reproductive: Uterus and right adnexa are unremarkable. There is a 2.6 cm left adnexal cyst. No follow-up imaging recommended. Note: This recommendation does not apply  to premenarchal patients and to those with increased risk (genetic, family history, elevated tumor markers or other high-risk factors) of ovarian cancer. Reference: JACR 2020 Feb; 17(2):248-254 Other: No large volume abdominopelvic ascites. Musculoskeletal: Multilevel degenerative changes spine. T10 and L3 vertebral body hemangiomas. Bilateral L5 pars defects with grade 1 anterolisthesis of L5 on S1. No acute osseous abnormality. IMPRESSION: 1. Nonobstructive staghorn type LEFT upper pole renal calculus which measures 3.0 cm, with perinephric and proximal periureteric stranding on the LEFT. Findings could be secondary to a recently passed stone or urinary tract infection. Correlate with urinalysis. 2. Nonobstructive right nephrolithiasis. 3. Colonic diverticulosis without findings of acute diverticulitis. 4. Bilateral L5 pars defects with grade 1 anterolisthesis of L5 on S1. 5. Aortic atherosclerosis.  Aortic Atherosclerosis (ICD10-I70.0). Electronically Signed   By: Dahlia Bailiff MD   On: 10/06/2020 11:49    Scheduled Meds: . azelastine  2 spray Each Nare BID  . enoxaparin (LOVENOX) injection  40 mg Subcutaneous Q24H  . fludrocortisone  50 mcg Oral Daily  . fluticasone  2 spray Each Nare Daily  . hydrocortisone  10 mg Oral BID  . [START ON 10/08/2020] hydrocortisone  15 mg Oral Daily  . [START ON 10/08/2020] hydrocortisone  5 mg Oral Daily  . loratadine  10 mg Oral Q2200   Continuous Infusions: . cefTRIAXone (ROCEPHIN)  IV 1 g (10/07/20 1031)  . lactated ringers 125 mL/hr at 10/07/20 1212     LOS: 0 days   Time spent: 30 minutes. More than 50% of the time was spent in counseling/coordination of care  Lorella Nimrod, MD [facility]  If 7PM-7AM, please contact night-coverage [email or web address]  10/07/2020, 3:24 PM   This record has been created using Systems analyst. Errors have been sought and corrected,but may not always be located. Such creation errors do not reflect  on the standard of care.

## 2020-10-07 NOTE — Consult Note (Signed)
Asked to see patient admitted with pyelonephritis and a nonobstructing left upper pole partial staghorn.  Her pain is improved and max temp overnight 99.8  CT was reviewed and she has bilateral nephrolithiasis with a nonobstructing left upper pole partial staghorn  Recommendation:  Nonobstructing left partial staghorn calculus  No indication for urgent intervention  Clinically improving  Treatment options were briefly discussed including PCNL and staged ureteroscopy  Recommend follow-up after discharge for further discussion  Please call for any change in clinical status   John Giovanni, MD

## 2020-10-08 ENCOUNTER — Other Ambulatory Visit: Payer: Self-pay | Admitting: Internal Medicine

## 2020-10-08 LAB — URINE CULTURE: Culture: >=100000 — AB

## 2020-10-08 MED ORDER — CEFADROXIL 1 G PO TABS: 1.0000 g | ORAL_TABLET | Freq: Two times a day (BID) | ORAL | 0 refills | Status: DC

## 2020-10-08 MED ORDER — CEPHALEXIN 500 MG PO CAPS: 500.0000 mg | ORAL_CAPSULE | Freq: Four times a day (QID) | ORAL | 0 refills | Status: DC

## 2020-10-08 MED ORDER — CEPHALEXIN 500 MG PO CAPS: 1000.0000 mg | ORAL_CAPSULE | Freq: Two times a day (BID) | ORAL | 0 refills | Status: DC

## 2020-10-08 MED ORDER — CEPHALEXIN 500 MG PO CAPS: 500.0000 mg | ORAL_CAPSULE | Freq: Three times a day (TID) | ORAL | 0 refills | Status: DC

## 2020-10-08 NOTE — Progress Notes (Addendum)
Patient's temp was 100. Given tylenol and will assess in a few hours.  Patient also complained of nausea throughout the night. Zofran given.  Christene Slates  10/08/2020  4:07 AM

## 2020-10-08 NOTE — Discharge Summary (Signed)
Physician Discharge Summary  Denise Burns TFT:[id number] DOB: Nov 08, 1970 DOA: 10/06/2020  PCP: Elby Beck, FNP (Inactive)  Admit date: 10/06/2020 Discharge date: 10/08/2020  Admitted From: Home Disposition: Home  Recommendations for Outpatient Follow-up:  1. Follow up with PCP in 1-2 weeks 2. Please obtain BMP/CBC in one week 3. Please follow up on the following pending results: None  Home Health: No Equipment/Devices: None Discharge Condition: Stable CODE STATUS: Full Diet recommendation: Heart Healthy   Brief/Interim Summary: Denise Connollyis a 50 y.o.femalewith medical history significant fornonseasonal allergic rhinitis,Addison's disease, chronic midline low back pain with bilateral sciatica, chronic right sacroiliac joint pain, spondylolithiasis, on chronic opioids, premature ovarian failure, presented to the emergency department for chief concerns of left lower back pain. Admitted for sepsis secondary to UTI/pyelonephritis with bilateral nonobstructing nephrolithiasis.  Started on ceftriaxone and urology was consulted, they were recommending outpatient follow-up once infection is cleared.  Urine cultures with pansensitive E. coli.  Blood cultures remain negative.  Patient received ceftriaxone while in the hospital and discharged on Keflex for another 10 days.  She was instructed to follow-up with urology as an outpatient.  Patient will continue rest of her home medications and follow-up with her providers.  Discharge Diagnoses:  Active Problems:   Pyelonephritis   Staghorn calculus   Sepsis [facility])   Discharge Instructions  Discharge Instructions    Diet - low sodium heart healthy   Complete by: As directed    Discharge instructions   Complete by: As directed    It was pleasure taking care of you. You are being given antibiotics for 7 days, please take it as directed. Please follow-up with urology for further management of your kidney stones.   Increase  activity slowly   Complete by: As directed      Allergies as of 10/08/2020      Reactions   Lisinopril Cough   Ultram [tramadol] Other (See Comments)   Sulfa Antibiotics Other (See Comments)      Medication List    TAKE these medications   azelastine 0.1 % nasal spray Commonly known as: ASTELIN Place 1 spray into both nostrils 2 (two) times daily.   cephALEXin 500 MG capsule Commonly known as: KEFLEX Take 1 capsule (500 mg total) by mouth 4 (four) times daily for 10 days.   Flonase Sensimist 27.5 MCG/SPRAY nasal spray Generic drug: fluticasone Place 2 sprays into the nose daily as needed for rhinitis.   fludrocortisone 0.1 MG tablet Commonly known as: FLORINEF TAKE 1/2 TABLET BY MOUTH DAILY   HYDROcodone-acetaminophen 7.5-325 MG tablet Commonly known as: NORCO Take 1 tablet by mouth every 8 (eight) hours as needed for severe pain. Must last 30 days.   HYDROcodone-acetaminophen 7.5-325 MG tablet Commonly known as: NORCO Take 1 tablet by mouth every 8 (eight) hours as needed for severe pain. Must last 30 days.   HYDROcodone-acetaminophen 7.5-325 MG tablet Commonly known as: NORCO Take 1 tablet by mouth every 8 (eight) hours as needed for severe pain. Must last 30 days.   hydrocortisone 10 MG tablet Commonly known as: CORTEF TAKE 1 & 1/2 TABLETS (15 MG) BY MOUTH DAILY WITH BREAKFAST AND 1/2 TABLETS (5 MG) DAILY (IN THE EVENING). What changed: See the new instructions.   levocetirizine 5 MG tablet Commonly known as: XYZAL Take 5 mg by mouth every evening.   meloxicam 15 MG tablet Commonly known as: MOBIC Take 1 tablet (15 mg total) by mouth daily.   pseudoephedrine 30 MG tablet Commonly known as: SUDAFED  Take 30 mg by mouth every 12 (twelve) hours as needed for congestion.   tiZANidine 4 MG capsule Commonly known as: Zanaflex Take 1 capsule (4 mg total) by mouth 3 (three) times daily. What changed:   when to take this  reasons to take this        Follow-up Information    Abbie Sons, MD. Go on 10/14/2020.   Specialty: Urology Why: 3pm appointment Contact information: [place] [address] [phone]              Allergies  Allergen Reactions  . Lisinopril Cough  . Ultram [Tramadol] Other (See Comments)  . Sulfa Antibiotics Other (See Comments)    Consultations:  Urology  Procedures/Studies: CT Renal Stone Study  Result Date: 10/06/2020 CLINICAL DATA:  Left lower abdominal/flank and back pain that started last night at 11 p.m., kidney stone suspected. EXAM: CT ABDOMEN AND PELVIS WITHOUT CONTRAST TECHNIQUE: Multidetector CT imaging of the abdomen and pelvis was performed following the standard protocol without IV contrast. COMPARISON:  None. FINDINGS: Lower chest: No acute abnormality. Normal size heart. No pericardial effusion. Hepatobiliary: Unremarkable noncontrast appearance of the hepatic parenchyma. Gallbladder surgically absent. No biliary ductal dilation. Pancreas: Unremarkable Spleen: Unremarkable Adrenals/Urinary Tract: Bilateral adrenal glands are unremarkable. Nonobstructive right lower pole renal calculus which measures 1.1 cm on image 76/5. Nonobstructive staghorn type left upper pole renal calculus which measures 3.0 cm on image 87/5. There is perinephric and proximal periureteric stranding on the left. Urinary bladder is grossly unremarkable for degree of distension. Stomach/Bowel: Stomach is within normal limits. Appendix appears normal. Colonic diverticulosis without findings of acute diverticulitis. No evidence of bowel wall thickening, distention, or inflammatory changes. Vascular/Lymphatic: Aortic atherosclerosis. No enlarged abdominal or pelvic lymph nodes. Reproductive: Uterus and right adnexa are unremarkable. There is a 2.6 cm left adnexal cyst. No follow-up imaging recommended. Note: This recommendation does not apply to premenarchal patients and to those with increased  risk (genetic, family history, elevated tumor markers or other high-risk factors) of ovarian cancer. Reference: JACR 2020 Feb; 17(2):248-254 Other: No large volume abdominopelvic ascites. Musculoskeletal: Multilevel degenerative changes spine. T10 and L3 vertebral body hemangiomas. Bilateral L5 pars defects with grade 1 anterolisthesis of L5 on S1. No acute osseous abnormality. IMPRESSION: 1. Nonobstructive staghorn type LEFT upper pole renal calculus which measures 3.0 cm, with perinephric and proximal periureteric stranding on the LEFT. Findings could be secondary to a recently passed stone or urinary tract infection. Correlate with urinalysis. 2. Nonobstructive right nephrolithiasis. 3. Colonic diverticulosis without findings of acute diverticulitis. 4. Bilateral L5 pars defects with grade 1 anterolisthesis of L5 on S1. 5. Aortic atherosclerosis.  Aortic Atherosclerosis (ICD10-I70.0). Electronically Signed   By: Dahlia Bailiff MD   On: 10/06/2020 11:49    Subjective: Patient was feeling better when seen today.  Having some headaches.  No nausea or vomiting.  Left lower back pain has been improved.  Discussed about continuation of antibiotics for 1 more week and follow-up with urology.  Discharge Exam: Vitals:   10/08/20 0542 10/08/20 0806  BP:  122/73  Pulse:  73  Resp:  18  Temp: 98.9 F (37.2 C) 98.8 F (37.1 C)  SpO2:  96%   Vitals:   10/07/20 1925 10/08/20 0341 10/08/20 0542 10/08/20 0806  BP: 109/64 136/86  122/73  Pulse: 84 89  73  Resp: 16 12  18   Temp: 98.5 F (36.9 C) 100 F (37.8 C) 98.9 F (37.2 C) 98.8  F (37.1 C)  TempSrc: Oral Oral Oral Oral  SpO2: 97% 93%  96%  Weight:      Height:        General: Pt is alert, awake, not in acute distress Cardiovascular: RRR, S1/S2 +, no rubs, no gallops Respiratory: CTA bilaterally, no wheezing, no rhonchi Abdominal: Soft, NT, ND, bowel sounds + Extremities: no edema, no cyanosis   The results of significant diagnostics from  this hospitalization (including imaging, microbiology, ancillary and laboratory) are listed below for reference.    Microbiology: Recent Results (from the past 240 hour(s))  Urine Culture     Status: Abnormal   Collection Time: 10/06/20  9:38 AM   Specimen: Urine, Random  Result Value Ref Range Status   Specimen Description   Final    URINE, RANDOM Performed at [facility], [address] [place], [place] [address]    Special Requests   Final    NONE Performed at [facility], [place], [place] [address]    Culture >=100,000 COLONIES/mL ESCHERICHIA COLI (A)  Final   Report Status 10/08/2020 FINAL  Final   Organism ID, Bacteria ESCHERICHIA COLI (A)  Final      Susceptibility   Escherichia coli - MIC*    AMPICILLIN 4 SENSITIVE Sensitive     CEFAZOLIN <=4 SENSITIVE Sensitive     CEFEPIME <=0.12 SENSITIVE Sensitive     CEFTRIAXONE <=0.25 SENSITIVE Sensitive     CIPROFLOXACIN <=0.25 SENSITIVE Sensitive     GENTAMICIN <=1 SENSITIVE Sensitive     IMIPENEM <=0.25 SENSITIVE Sensitive     NITROFURANTOIN <=16 SENSITIVE Sensitive     TRIMETH/SULFA <=20 SENSITIVE Sensitive     AMPICILLIN/SULBACTAM <=2 SENSITIVE Sensitive     PIP/TAZO <=4 SENSITIVE Sensitive     * >=100,000 COLONIES/mL ESCHERICHIA COLI  SARS CORONAVIRUS 2 (TAT 6-24 HRS) Nasopharyngeal Nasopharyngeal Swab     Status: None   Collection Time: 10/06/20  2:41 PM   Specimen: Nasopharyngeal Swab  Result Value Ref Range Status   SARS Coronavirus 2 NEGATIVE NEGATIVE Final    Comment: (NOTE) SARS-CoV-2 target nucleic acids are NOT DETECTED.  The SARS-CoV-2 RNA is generally detectable in upper and lower respiratory specimens during the acute phase of infection. Negative results do not preclude SARS-CoV-2 infection, do not rule out co-infections with other pathogens, and should not be used as the sole basis for treatment or other patient management decisions. Negative results must be combined  with clinical observations, patient history, and epidemiological information. The expected result is Negative.  Fact Sheet for Patients: SugarRoll.be  Fact Sheet for Healthcare Providers: https://www.woods-mathews.com/  This test is not yet approved or cleared by the [place] FDA and  has been authorized for detection and/or diagnosis of SARS-CoV-2 by FDA under an Emergency Use Authorization (EUA). This EUA will remain  in effect (meaning this test can be used) for the duration of the COVID-19 declaration under Se ction 564(b)(1) of the Act, 21 U.S.C. section 360bbb-3(b)(1), unless the authorization is terminated or revoked sooner.  Performed at [facility], [place] [address] [place], [place] [address]   Culture, blood (Routine X 2) w Reflex to ID Panel     Status: None (Preliminary result)   Collection Time: 10/06/20  2:42 PM   Specimen: BLOOD  Result Value Ref Range Status   Specimen Description BLOOD LEFT ANTECUBITAL  Final   Special Requests   Final    BOTTLES DRAWN AEROBIC AND ANAEROBIC Blood Culture results  may not be optimal due to an excessive volume of blood received in culture bottles   Culture   Final    NO GROWTH 2 DAYS Performed at [facility], [address] [place], [place] [address]    Report Status PENDING  Incomplete  Culture, blood (Routine X 2) w Reflex to ID Panel     Status: None (Preliminary result)   Collection Time: 10/06/20  2:42 PM   Specimen: BLOOD  Result Value Ref Range Status   Specimen Description BLOOD BLOOD RIGHT WRIST  Final   Special Requests   Final    BOTTLES DRAWN AEROBIC AND ANAEROBIC Blood Culture results may not be optimal due to an excessive volume of blood received in culture bottles   Culture   Final    NO GROWTH 2 DAYS Performed at [facility], [address], [place], [place] [address]    Report Status PENDING  Incomplete     Labs: BNP (last 3  results) No results for input(s): BNP in the last 8760 hours. Basic Metabolic Panel: Recent Labs  Lab 10/06/20 0938 10/07/20 0535  NA 141 140  K 3.2* 3.5  CL 109 110  CO2 24 26  GLUCOSE 102* 139*  BUN 25* 11  CREATININE 1.03* 0.69  CALCIUM 9.1 8.1*   Liver Function Tests: Recent Labs  Lab 10/06/20 0938  AST 38  ALT 34  ALKPHOS 68  BILITOT 1.9*  PROT 6.8  ALBUMIN 3.8   Recent Labs  Lab 10/06/20 0938  LIPASE 30   No results for input(s): AMMONIA in the last 168 hours. CBC: Recent Labs  Lab 10/06/20 0938 10/07/20 0535  WBC 11.5* 7.3  HGB 13.7 11.4*  HCT 40.8 34.9*  MCV 94.4 96.7  PLT 139* 114*   Cardiac Enzymes: No results for input(s): CKTOTAL, CKMB, CKMBINDEX, TROPONINI in the last 168 hours. BNP: Invalid input(s): POCBNP CBG: No results for input(s): GLUCAP in the last 168 hours. D-Dimer No results for input(s): DDIMER in the last 72 hours. Hgb A1c No results for input(s): HGBA1C in the last 72 hours. Lipid Profile No results for input(s): CHOL, HDL, LDLCALC, TRIG, CHOLHDL, LDLDIRECT in the last 72 hours. Thyroid function studies No results for input(s): TSH, T4TOTAL, T3FREE, THYROIDAB in the last 72 hours.  Invalid input(s): FREET3 Anemia work up No results for input(s): VITAMINB12, FOLATE, FERRITIN, TIBC, IRON, RETICCTPCT in the last 72 hours. Urinalysis    Component Value Date/Time   COLORURINE AMBER (A) 10/06/2020 0938   APPEARANCEUR CLOUDY (A) 10/06/2020 0938   LABSPEC 1.018 10/06/2020 0938   PHURINE 8.0 10/06/2020 0938   GLUCOSEU NEGATIVE 10/06/2020 0938   HGBUR LARGE (A) 10/06/2020 0938   BILIRUBINUR NEGATIVE 10/06/2020 [place] 10/06/2020 0938   PROTEINUR 100 (A) 10/06/2020 0938   NITRITE NEGATIVE 10/06/2020 0938   LEUKOCYTESUR LARGE (A) 10/06/2020 0938   Sepsis Labs Invalid input(s): PROCALCITONIN,  WBC,  LACTICIDVEN Microbiology Recent Results (from the past 240 hour(s))  Urine Culture     Status: Abnormal    Collection Time: 10/06/20  9:38 AM   Specimen: Urine, Random  Result Value Ref Range Status   Specimen Description   Final    URINE, RANDOM Performed at [facility], [address] [place], [place] [address]    Special Requests   Final    NONE Performed at [facility], [address] [place],  [address]    Culture >=100,000 COLONIES/mL ESCHERICHIA COLI (A)  Final  Report Status 10/08/2020 FINAL  Final   Organism ID, Bacteria ESCHERICHIA COLI (A)  Final      Susceptibility   Escherichia coli - MIC*    AMPICILLIN 4 SENSITIVE Sensitive     CEFAZOLIN <=4 SENSITIVE Sensitive     CEFEPIME <=0.12 SENSITIVE Sensitive     CEFTRIAXONE <=0.25 SENSITIVE Sensitive     CIPROFLOXACIN <=0.25 SENSITIVE Sensitive     GENTAMICIN <=1 SENSITIVE Sensitive     IMIPENEM <=0.25 SENSITIVE Sensitive     NITROFURANTOIN <=16 SENSITIVE Sensitive     TRIMETH/SULFA <=20 SENSITIVE Sensitive     AMPICILLIN/SULBACTAM <=2 SENSITIVE Sensitive     PIP/TAZO <=4 SENSITIVE Sensitive     * >=100,000 COLONIES/mL ESCHERICHIA COLI  SARS CORONAVIRUS 2 (TAT 6-24 HRS) Nasopharyngeal Nasopharyngeal Swab     Status: None   Collection Time: 10/06/20  2:41 PM   Specimen: Nasopharyngeal Swab  Result Value Ref Range Status   SARS Coronavirus 2 NEGATIVE NEGATIVE Final    Comment: (NOTE) SARS-CoV-2 target nucleic acids are NOT DETECTED.  The SARS-CoV-2 RNA is generally detectable in upper and lower respiratory specimens during the acute phase of infection. Negative results do not preclude SARS-CoV-2 infection, do not rule out co-infections with other pathogens, and should not be used as the sole basis for treatment or other patient management decisions. Negative results must be combined with clinical observations, patient history, and epidemiological information. The expected result is Negative.  Fact Sheet for Patients: SugarRoll.be  Fact Sheet for Healthcare  Providers: https://www.woods-mathews.com/  This test is not yet approved or cleared by the [place] FDA and  has been authorized for detection and/or diagnosis of SARS-CoV-2 by FDA under an Emergency Use Authorization (EUA). This EUA will remain  in effect (meaning this test can be used) for the duration of the COVID-19 declaration under Se ction 564(b)(1) of the Act, 21 U.S.C. section 360bbb-3(b)(1), unless the authorization is terminated or revoked sooner.  Performed at [facility], [place] [address], [place], [place] [address]   Culture, blood (Routine X 2) w Reflex to ID Panel     Status: None (Preliminary result)   Collection Time: 10/06/20  2:42 PM   Specimen: BLOOD  Result Value Ref Range Status   Specimen Description BLOOD LEFT ANTECUBITAL  Final   Special Requests   Final    BOTTLES DRAWN AEROBIC AND ANAEROBIC Blood Culture results may not be optimal due to an excessive volume of blood received in culture bottles   Culture   Final    NO GROWTH 2 DAYS Performed at [facility], [address] [place], [place] [address]    Report Status PENDING  Incomplete  Culture, blood (Routine X 2) w Reflex to ID Panel     Status: None (Preliminary result)   Collection Time: 10/06/20  2:42 PM   Specimen: BLOOD  Result Value Ref Range Status   Specimen Description BLOOD BLOOD RIGHT WRIST  Final   Special Requests   Final    BOTTLES DRAWN AEROBIC AND ANAEROBIC Blood Culture results may not be optimal due to an excessive volume of blood received in culture bottles   Culture   Final    NO GROWTH 2 DAYS Performed at [facility] [address] [place], [place] [address]    Report Status PENDING  Incomplete    Time coordinating discharge: Over 30 minutes  SIGNED:  Lorella Nimrod, MD  [facility] 10/08/2020, 2:29 PM  If 7PM-7AM, please contact night-coverage  [email or web address]  This record has been created using TEFL teacher. Errors have been sought and corrected,but may not always be located. Such creation errors do not reflect on the standard of care.

## 2020-10-08 NOTE — Progress Notes (Signed)
Rechecked patient's temperature and it decreased to 98.9.  Will continue to monitor.  Christene Slates

## 2020-10-09 ENCOUNTER — Telehealth: Payer: PRIVATE HEALTH INSURANCE | Attending: Neurology

## 2020-10-09 DIAGNOSIS — G4733 Obstructive sleep apnea (adult) (pediatric): Secondary | ICD-10-CM

## 2020-10-09 DIAGNOSIS — G43009 Migraine without aura, not intractable, without status migrainosus: Secondary | ICD-10-CM

## 2020-10-09 MED ORDER — UBRELVY 50 MG PO TABS
50 mg | ORAL_TABLET | ORAL | 3 refills | Status: AC | PRN
Start: 2020-10-09 — End: 2020-10-13

## 2020-10-09 NOTE — Consults
NEUROLOGY VIDEO-TELEMEDICINE CONSULT    Referring Physician: Dalbert Mayotte, MD    Reason for Referral: Assessment of headaches    Patient Consent to Telehealth Questionnaire   [facility] TELEHEALTH PRECHECKIN QUESTIONS 10/09/2020   By clicking ''I Agree'', I consent to the below:  I Agree   - I agree  to be treated via a video visit and acknowledge that I may be liable for any relevant copays or coinsurance depending on my insurance plan.  - I understand that this video visit is offered for my convenience and I am able to cancel and reschedule for an in-person appointment if I desire.  - I also acknowledge that sensitive medical information may be discussed during this video visit appointment and that it is my responsibility to locate myself in a location that ensures privacy to my own level of comfort.  - I also acknowledge that I should not be participating in a video visit in a way that could cause danger to myself or to those around me (such as driving or walking).  If my provider is concerned about my safety, I understand that they have the right to terminate the visit.     Subjective:    CC: ''Headaches.''    HISTORY OF PRESENT ILLNESS:  Sarah Martin is a 50 y.o. female with h/o depression and anxiety, presenting for neurological evaluation using video telemedicine to prevent contagion with Covid. Patient reports recurrent episodes of cephalalgia for approximately 1 year. These usually occur at approximately 4 am, waking patient from sleep. The pain is described as a pounding, global cephalalgia, ''like a jackhammer'', associated with intolerance to movements. Patient may also have photophobia but she is unsure because usually lights are out when she has symptoms. No nausea or a visual aura. Usual duration ranges from 1 to 120 minutes. Patient takes Advil which usually allows her to go back to sleep. However, many times a mild headache lingers thereafter for 24 hours. Headache frequency is variable. For instance, in January she had 10 episodes, in February she had 3 episodes and so far in March she has had 3. No history of headaches earlier in life. Patient was recently diagnosed with OSA and she is currently awaiting for a mask fitting session.     REVIEW OF SYSTEMS:  The 14-Point Review of systems check list was completed by the patient, and a copy was consigned to the chart. Constitutional: Negative. GI: Negative. GU: Negative. Musculoskeletal: Negative. Cardiac: Negative. Pulmonary: Negative. Endocrine: Negative. Hematologic/Lymphatic: Negative. Eye: negative. ENT: Negative. Psychiatric: Negative. Neurologic: As above. No gait unsteadiness, visual changes, headache, seizures, speech difficulties, or memory problems. Skin: negative.    PAST MEDICAL HISTORY:  I have reviewed the patient's medical history in detail and updated the computerized patient record. As above, significant for depression, anxiety and hyperlipidemia.   No past surgical history on file.    MEDICATIONS:  Wellbutrin 450 mg PO every day, Effexor, Abilify, Clonazepam, atorvastatin, and MV.    ALLERGIES:  Not on File    SOCIAL HISTORY:  Social History     Socioeconomic History   ??? Marital status: Married     Spouse name: Not on file   ??? Number of children: Not on file   ??? Years of education: Not on file   ??? Highest education level: Not on file   Occupational History   ??? Not on file   Tobacco Use   ??? Smoking status: Not on file   ??? Smokeless tobacco: Not  on file   Substance and Sexual Activity   ??? Alcohol use: Not on file   ??? Drug use: Not on file   ??? Sexual activity: Not on file   Other Topics Concern   ??? Not on file   Social History Narrative   ??? Not on file     Social Determinants of Health     Physical Activity: Not on file   Stress: Not on file   Financial Resource Strain: Not on file     FAMILY HISTORY:   No family history of migraine.      Objective:    Physical Exam:  There were no vitals filed for this visit.    General: Well-nourished female, in no apparent distress.     HEENT: Normocephalic and atraumatic. Normal conjunctivae and sclerae. Normal oropharynx. Mallampati III.    Neck: Supple, with full range of motion.    Thorax: Auscultation could not be performed.    Abdomen: Nondistended.     Extremities: No clubbing, cyanosis, rash, or edema.     Mrs. Chacko was fully alert and oriented to person, place, time and circumstance.     Affect was appropriate.     General fund of knowledge was appropriate.     Registration was 3/3 and delayed recall after 5 minutes, 2/3.     Speech was fluent; able to name, repeat, and follow 3-step commands.      Able to calculate serial sevens and spell WORLD backwards.     Cranial Nerve Examination: Extraocular muscles were intact. PERRLA bilaterally. Visual fields were full to confrontation bilaterally. Face was symmetric. Sensory examination of the face could not be tested. Hearing was grossly intact. Tongue protruded to the midline. Palate was symmetric. Uvula elevated in the midline. Able to shrug shoulders symmetrically.      Motor examination: Normal tone and bulk. No pronator drift. Formal testing of muscle power could not be performed; however, patient was able to move all limbs equally against gravity. No tremor.     Sensory examination, deep tendon reflexes and plantar response could not be tested. Patient denied paresthesia at the time of the interview.     Coordination: Normal finger to nose test bilaterally.      Stance and gait were unremarkable. Romberg test was negative.     LABORATORY DATA:  CBC and general chemistries from 2021 were unremarkable    NEUROIMAGING DATA:  Brain MRI from 01-22-2020 was unremarkable    ELECTROPHYSIOLOGY DATA:  Sleep study (report not available) showed sleep apnea.    IMPRESSION:  In summary, Sarah Martin is a 50 y.o. female with history of recurrent episodes of severe cephalalgia which disrupt sleep. The neurological exam and neuroimaging are unremarkable. The principal diagnostic consideration is migraine without aura, in spite of the atypical presentation; it is possible that OSA serves as substrate for some of these headaches. Of note is that clonazepam can worsen OSA. She needs CPAP and the process to set this up is underway.     Patient has been able to abort most headaches with Advil, although in many instances a lingering head discomfort continues for up to 24 hours. Patient is not a good candidate for treatment with triptans because she takes 2 antidepressants at high doses, and an adverse drug interaction is possible. For this reason I recommended ubrogepant prn. I discussed with the patient the pros and cons of treatment. I also recommended prophylactic treatment with magnesium oxide 400 mg Po every day  and riboflavin 100 mg PO every day which can be obtained over the counter.     PLAN:  1. Agree with efforts to start CPAP for OSA  2. Start ubrogepant 50 mg PO prn  3. Use Advil as rescue strategy  4. Start magnesium 400 mg Po at bedtime   5. Start riboflavin 100 mg PO at bedtime  6. F/U in 6 months    This was a high-complexity neurological evaluation. Patient has a risk of neurological deterioration. I spent 60 minutes in face-to-face time, providing counseling about migraine, and coordinating future care. All questions were answered to patient's satisfaction.

## 2020-10-11 LAB — CULTURE, BLOOD (ROUTINE X 2)
Culture: NO GROWTH
Culture: NO GROWTH

## 2020-10-12 ENCOUNTER — Ambulatory Visit: Payer: PRIVATE HEALTH INSURANCE | Attending: Neurology

## 2020-10-12 ENCOUNTER — Ambulatory Visit: Payer: PRIVATE HEALTH INSURANCE

## 2020-10-12 MED ORDER — UBRELVY 50 MG PO TABS
50 mg | ORAL_TABLET | ORAL | 3 refills | Status: AC | PRN
Start: 2020-10-12 — End: ?

## 2020-10-12 NOTE — Telephone Encounter
I received a PA request but after I submitted it, it says they do not manage the PA.      Rx resent to pharmacy.

## 2020-10-14 ENCOUNTER — Ambulatory Visit: Payer: Self-pay | Admitting: Urology

## 2020-10-15 ENCOUNTER — Encounter: Payer: Self-pay | Admitting: Urology

## 2020-10-15 ENCOUNTER — Other Ambulatory Visit: Payer: Self-pay

## 2020-10-15 ENCOUNTER — Ambulatory Visit: Payer: BC Managed Care – PPO | Admitting: Urology

## 2020-10-15 ENCOUNTER — Ambulatory Visit: Payer: Self-pay | Admitting: Urology

## 2020-10-15 VITALS — BP 138/80 | HR 94 | Ht 64.0 in | Wt 180.0 lb

## 2020-10-15 DIAGNOSIS — N200 Calculus of kidney: Secondary | ICD-10-CM | POA: Diagnosis not present

## 2020-10-15 DIAGNOSIS — N12 Tubulo-interstitial nephritis, not specified as acute or chronic: Secondary | ICD-10-CM | POA: Diagnosis not present

## 2020-10-15 NOTE — Progress Notes (Signed)
10/15/2020 10:45 AM   Denise Burns 10-10-1970 [id number]  Referring provider: No referring provider defined for this encounter.  Chief Complaint  Patient presents with  . Other    HPI: 50 y.o. female presents for hospital follow-up.   Admitted [facility] 10/06/2020 with pyelonephritis  CT remarkable for bilateral nonobstructing renal calculi with a left upper pole partial staghorn measuring 3 cm in its greatest dimension  No prior history of stone disease or recurrent UTI  Presently asymptomatic   PMH: Past Medical History:  Diagnosis Date  . Addison disease ([place])   . Arthritis   . Back pain   . GERD (gastroesophageal reflux disease)     Surgical History: Past Surgical History:  Procedure Laterality Date  . CHOLECYSTECTOMY    . POLYPECTOMY      Home Medications:  Allergies as of 10/15/2020      Reactions   Lisinopril Cough   Ultram [tramadol] Other (See Comments)   Sulfa Antibiotics Other (See Comments)      Medication List       Accurate as of October 15, 2020 10:45 AM. If you have any questions, ask your nurse or doctor.        azelastine 0.1 % nasal spray Commonly known as: ASTELIN Place 1 spray into both nostrils 2 (two) times daily.   cephALEXin 500 MG capsule Commonly known as: KEFLEX Take 1 capsule (500 mg total) by mouth 4 (four) times daily for 10 days.   Flonase Sensimist 27.5 MCG/SPRAY nasal spray Generic drug: fluticasone Place 2 sprays into the nose daily as needed for rhinitis.   fludrocortisone 0.1 MG tablet Commonly known as: FLORINEF TAKE 1/2 TABLET BY MOUTH DAILY   HYDROcodone-acetaminophen 7.5-325 MG tablet Commonly known as: NORCO Take 1 tablet by mouth every 8 (eight) hours as needed for severe pain. Must last 30 days.   HYDROcodone-acetaminophen 7.5-325 MG tablet Commonly known as: NORCO Take 1 tablet by mouth every 8 (eight) hours as needed for severe pain. Must last 30 days.   HYDROcodone-acetaminophen 7.5-325 MG  tablet Commonly known as: NORCO Take 1 tablet by mouth every 8 (eight) hours as needed for severe pain. Must last 30 days.   hydrocortisone 10 MG tablet Commonly known as: CORTEF TAKE 1 & 1/2 TABLETS (15 MG) BY MOUTH DAILY WITH BREAKFAST AND 1/2 TABLETS (5 MG) DAILY (IN THE EVENING). What changed: See the new instructions.   levocetirizine 5 MG tablet Commonly known as: XYZAL Take 5 mg by mouth every evening.   meloxicam 15 MG tablet Commonly known as: MOBIC Take 1 tablet (15 mg total) by mouth daily.   pseudoephedrine 30 MG tablet Commonly known as: SUDAFED Take 30 mg by mouth every 12 (twelve) hours as needed for congestion.   tiZANidine 4 MG capsule Commonly known as: Zanaflex Take 1 capsule (4 mg total) by mouth 3 (three) times daily. What changed:   when to take this  reasons to take this       Allergies:  Allergies  Allergen Reactions  . Lisinopril Cough  . Ultram [Tramadol] Other (See Comments)  . Sulfa Antibiotics Other (See Comments)    Family History: Family History  Problem Relation Age of Onset  . Arthritis Mother   . Hyperlipidemia Mother   . Hypertension Mother   . Alcohol abuse Father   . Hearing loss Father   . Hyperlipidemia Father     Social History:  reports that she has quit smoking. She has never used smokeless tobacco.  She reports current alcohol use. She reports that she does not use drugs.   Physical Exam: BP 138/80   Pulse 94   Ht 5\' 4"  (1.626 m)   Wt 180 lb (81.6 kg)   LMP 06/08/2018 (Exact Date)   BMI 30.90 kg/m   Constitutional:  Alert and oriented, No acute distress. HEENT: [place] AT, moist mucus membranes.  Trachea midline, no masses. Cardiovascular: No clubbing, cyanosis, or edema. Respiratory: Normal respiratory effort, no increased work of breathing. Neurologic: Grossly intact, no focal deficits, moving all 4 extremities. Psychiatric: Normal mood and affect.   Pertinent Imaging: CT images were personally reviewed and  interpreted  CT Renal Stone Study  Narrative CLINICAL DATA:  Left lower abdominal/flank and back pain that started last night at 11 p.m., kidney stone suspected.  EXAM: CT ABDOMEN AND PELVIS WITHOUT CONTRAST  TECHNIQUE: Multidetector CT imaging of the abdomen and pelvis was performed following the standard protocol without IV contrast.  COMPARISON:  None.  FINDINGS: Lower chest: No acute abnormality. Normal size heart. No pericardial effusion.  Hepatobiliary: Unremarkable noncontrast appearance of the hepatic parenchyma. Gallbladder surgically absent. No biliary ductal dilation.  Pancreas: Unremarkable  Spleen: Unremarkable  Adrenals/Urinary Tract: Bilateral adrenal glands are unremarkable.  Nonobstructive right lower pole renal calculus which measures 1.1 cm on image 76/5. Nonobstructive staghorn type left upper pole renal calculus which measures 3.0 cm on image 87/5. There is perinephric and proximal periureteric stranding on the left. Urinary bladder is grossly unremarkable for degree of distension.  Stomach/Bowel: Stomach is within normal limits. Appendix appears normal. Colonic diverticulosis without findings of acute diverticulitis. No evidence of bowel wall thickening, distention, or inflammatory changes.  Vascular/Lymphatic: Aortic atherosclerosis. No enlarged abdominal or pelvic lymph nodes.  Reproductive: Uterus and right adnexa are unremarkable. There is a 2.6 cm left adnexal cyst. No follow-up imaging recommended. Note: This recommendation does not apply to premenarchal patients and to those with increased risk (genetic, family history, elevated tumor markers or other high-risk factors) of ovarian cancer. Reference: JACR 2020 Feb; 17(2):248-254  Other: No large volume abdominopelvic ascites.  Musculoskeletal: Multilevel degenerative changes spine. T10 and L3 vertebral body hemangiomas. Bilateral L5 pars defects with grade 1 anterolisthesis of L5 on  S1. No acute osseous abnormality.  IMPRESSION: 1. Nonobstructive staghorn type LEFT upper pole renal calculus which measures 3.0 cm, with perinephric and proximal periureteric stranding on the LEFT. Findings could be secondary to a recently passed stone or urinary tract infection. Correlate with urinalysis. 2. Nonobstructive right nephrolithiasis. 3. Colonic diverticulosis without findings of acute diverticulitis. 4. Bilateral L5 pars defects with grade 1 anterolisthesis of L5 on S1. 5. Aortic atherosclerosis.  Aortic Atherosclerosis (ICD10-I70.0).   Electronically Signed By: Dahlia Bailiff MD On: 10/06/2020 11:49   Assessment & Plan:    1.  Bilateral nephrolithiasis with a left upper pole partial staghorn calculus  Although she did have a UTI E. coli is typically not a urease producing organism We discussed various treatment options for urolithiasis including observation with or without medical expulsive therapy, shockwave lithotripsy (SWL), ureteroscopy and laser lithotripsy with stent placement, and percutaneous nephrolithotomy. We discussed that management is based on stone size, location, density, patient co-morbidities, and patient preference.  Based on stone size and location medical expulsive therapy and SWL are not viable options Ureteroscopy with laser lithotripsy and stent placement has a higher stone free rate than SWL in a single procedure, however increased complication rate including possible infection, ureteral injury, bleeding, and stent related morbidity. Common  stent related symptoms include dysuria, urgency/frequency, and flank pain.  Ureteroscopy for a 3 cm calculus would need a staged procedure PCNL is the favored treatment for stones >2cm. It involves a small incision in the flank, with complete fragmentation of stones and removal. It has the highest stone free rate, but also the highest complication rate. Possible complications include bleeding, infection/sepsis,  injury to surrounding organs including the pleura, and collecting system injury.  After an extensive discussion of the risks and benefits of the above treatment options, the patient would like to proceed with PCNL Will set her up with one of my partners performing [place], [place] [address] [place], [place] [address] [phone]

## 2020-10-21 ENCOUNTER — Telehealth: Payer: Self-pay | Admitting: Urology

## 2020-10-21 DIAGNOSIS — R102 Pelvic and perineal pain: Secondary | ICD-10-CM | POA: Diagnosis not present

## 2020-10-21 DIAGNOSIS — N950 Postmenopausal bleeding: Secondary | ICD-10-CM | POA: Diagnosis not present

## 2020-10-21 NOTE — Telephone Encounter (Signed)
Pt. Called to inquire about the referral for PCNL that Dr. Bernardo Heater spoke with her about on her last appt.  Please call pt. To advise status.

## 2020-10-22 ENCOUNTER — Encounter: Payer: Self-pay | Admitting: Nurse Practitioner

## 2020-10-22 ENCOUNTER — Ambulatory Visit (INDEPENDENT_AMBULATORY_CARE_PROVIDER_SITE_OTHER): Payer: BC Managed Care – PPO | Admitting: Nurse Practitioner

## 2020-10-22 ENCOUNTER — Other Ambulatory Visit: Payer: Self-pay

## 2020-10-22 ENCOUNTER — Other Ambulatory Visit: Payer: Self-pay | Admitting: Nurse Practitioner

## 2020-10-22 VITALS — BP 123/76 | HR 82 | Temp 97.7°F | Ht 64.0 in | Wt 186.8 lb

## 2020-10-22 DIAGNOSIS — N289 Disorder of kidney and ureter, unspecified: Secondary | ICD-10-CM

## 2020-10-22 DIAGNOSIS — Z7689 Persons encountering health services in other specified circumstances: Secondary | ICD-10-CM | POA: Diagnosis not present

## 2020-10-22 DIAGNOSIS — M5441 Lumbago with sciatica, right side: Secondary | ICD-10-CM

## 2020-10-22 DIAGNOSIS — D509 Iron deficiency anemia, unspecified: Secondary | ICD-10-CM | POA: Diagnosis not present

## 2020-10-22 DIAGNOSIS — G8929 Other chronic pain: Secondary | ICD-10-CM

## 2020-10-22 DIAGNOSIS — Z1211 Encounter for screening for malignant neoplasm of colon: Secondary | ICD-10-CM

## 2020-10-22 DIAGNOSIS — Z1231 Encounter for screening mammogram for malignant neoplasm of breast: Secondary | ICD-10-CM

## 2020-10-22 DIAGNOSIS — N200 Calculus of kidney: Secondary | ICD-10-CM

## 2020-10-22 DIAGNOSIS — M5442 Lumbago with sciatica, left side: Secondary | ICD-10-CM

## 2020-10-22 MED ORDER — TIZANIDINE HCL 4 MG PO TABS: 4.0000 mg | ORAL_TABLET | Freq: Three times a day (TID) | ORAL | 2 refills | Status: DC | PRN

## 2020-10-22 NOTE — Patient Instructions (Signed)

## 2020-10-22 NOTE — Progress Notes (Signed)
New Patient Office Visit  Subjective:  Patient ID: Denise Burns, female    DOB: September 21, 1970  Age: 50 y.o. MRN: [id number]  CC:  Chief Complaint  Patient presents with  . New Patient (Initial Visit)    HPI Denise Burns presents to establish primary care provider. She is transferring her care because the provider she had been seeing for some time recently left the practice she was going to. The patient hospitalized from 10/06/2020 through 3/10/202 due to pyelonephritis and bilateral non-obstructing renal stones. She has already followed up with urology on 10/15/2020. Will have to be scheduled with a provider who can surgically remove her renal calculi as stones are too large to capture and too large for her to undergo lithotripsy procedure. She should have a BMP and CBC which were recommended in her hospital discharge. These labs have not been done yet.  She does see pain management provider due to severe back pain. They manage narcotic prescriptions for her.  She sees gyn provider. Has been having abnormal uterine bleeding. She is scheduled to have endometrial biopsy on 11/02/2020.  She is followed per endocrinology for Addison's disease which is currently well managed.  She is due to have screening mammogram. She should be referred to GI for screening colonoscopy.   Past Medical History:  Diagnosis Date  . Addison disease ([place])   . Arthritis   . Back pain   . GERD (gastroesophageal reflux disease)     Past Surgical History:  Procedure Laterality Date  . CHOLECYSTECTOMY    . POLYPECTOMY      Family History  Problem Relation Age of Onset  . Arthritis Mother   . Hyperlipidemia Mother   . Hypertension Mother   . High blood pressure Mother   . Alcohol abuse Father   . Hearing loss Father   . Hyperlipidemia Father   . High blood pressure Father     Social History   Socioeconomic History  . Marital status: Married    Spouse name: Not on file  . Number of children:  Not on file  . Years of education: Not on file  . Highest education level: Not on file  Occupational History  . Not on file  Tobacco Use  . Smoking status: Former Smoker    Quit date: 08/01/1994    Years since quitting: 26.2  . Smokeless tobacco: Never Used  Vaping Use  . Vaping Use: Never used  Substance and Sexual Activity  . Alcohol use: Not Currently  . Drug use: Yes  . Sexual activity: Yes    Partners: Male    Comment: menopause  Other Topics Concern  . Not on file  Social History Narrative  . Not on file   Social Determinants of Health   Financial Resource Strain: Not on file  Food Insecurity: Not on file  Transportation Needs: Not on file  Physical Activity: Not on file  Stress: Not on file  Social Connections: Not on file  Intimate Partner Violence: Not on file    ROS Review of Systems  Constitutional: Positive for activity change and fatigue. Negative for chills and fever.  HENT: Negative for congestion, postnasal drip, rhinorrhea and sinus pain.   Respiratory: Negative for cough, shortness of breath and wheezing.   Cardiovascular: Negative for chest pain and palpitations.  Gastrointestinal: Negative for constipation, diarrhea, nausea and vomiting.  Endocrine: Negative for cold intolerance, heat intolerance, polydipsia and polyuria.       Sees endocrinology for Addison's  disease  Genitourinary: Positive for flank pain.       Seeing urology after having pyelonephritis from non-obstructing renal calculi.   Musculoskeletal: Positive for back pain. Negative for myalgias.  Skin: Negative for rash.  Neurological: Negative for dizziness, weakness and headaches.  Psychiatric/Behavioral: The patient is nervous/anxious.   All other systems reviewed and are negative.   Objective:   Today's Vitals   10/22/20 1339  BP: 123/76  Pulse: 82  Temp: 97.7 F (36.5 C)  SpO2: 99%  Weight: 186 lb 12.8 oz (84.7 kg)  Height: 5\' 4"  (1.626 m)   Body mass index is 32.06  kg/m.    Physical Exam Vitals and nursing note reviewed.  Constitutional:      Appearance: Normal appearance. She is well-developed.  HENT:     Head: Normocephalic and atraumatic.  Eyes:     Pupils: Pupils are equal, round, and reactive to light.  Neck:     Vascular: No carotid bruit.  Cardiovascular:     Rate and Rhythm: Normal rate and regular rhythm.     Pulses: Normal pulses.     Heart sounds: Normal heart sounds.  Pulmonary:     Effort: Pulmonary effort is normal.     Breath sounds: Normal breath sounds.  Abdominal:     General: Bowel sounds are normal.     Palpations: Abdomen is soft.     Tenderness: There is no abdominal tenderness.  Musculoskeletal:        General: Normal range of motion.     Cervical back: Normal range of motion and neck supple.  Skin:    General: Skin is warm and dry.  Neurological:     General: No focal deficit present.     Mental Status: She is alert and oriented to person, place, and time.  Psychiatric:        Mood and Affect: Mood normal.        Behavior: Behavior normal.        Thought Content: Thought content normal.        Judgment: Judgment normal.     Assessment & Plan:  1. Encounter to establish care Appointment today to establish new primary care provider   2. Multiple renal calculi Reviewed renal stone studies from hospital stay. She has 1.1 cm non obstructing calculus in right kidney. She has 3.0 cm staghorn calculus in left kidney. She is now following up with urology.   3. Abnormal kidney function Check BMP during today's visit  - Basic Metabolic Panel (BMET) - CBC with Differential/Platelet  4. Iron deficiency anemia, unspecified iron deficiency anemia type Recheck CBC at today's visit - Basic Metabolic Panel (BMET) - CBC with Differential/Platelet  5. Chronic midline low back pain with bilateral sciatica May continue to take tizanidine 4mg  tablets up to three times daliy as needed for muscle pain/tightnss.  -  tiZANidine (ZANAFLEX) 4 MG tablet; Take 1 tablet (4 mg total) by mouth every 8 (eight) hours as needed for muscle spasms.  Dispense: 90 tablet; Refill: 2  6. Encounter for screening mammogram for malignant neoplasm of breast Order for mammogram placed today.  - MM DIGITAL SCREENING BILATERAL; Future  7. Screening for colon cancer Refer to GI for screening colonoscopy.  - Ambulatory referral to Gastroenterology   Problem List Items Addressed This Visit      Nervous and Auditory   Chronic midline low back pain with bilateral sciatica (Chronic)   Relevant Medications   tiZANidine (ZANAFLEX) 4 MG tablet  Genitourinary   Multiple renal calculi     Other   Encounter to establish care - Primary   Abnormal kidney function   Relevant Orders   Basic Metabolic Panel (BMET)   CBC with Differential/Platelet (Completed)   Iron deficiency anemia   Relevant Orders   Basic Metabolic Panel (BMET)   CBC with Differential/Platelet (Completed)   Encounter for screening mammogram for malignant neoplasm of breast   Relevant Orders   MM DIGITAL SCREENING BILATERAL   Screening for colon cancer   Relevant Orders   Ambulatory referral to Gastroenterology      Outpatient Encounter Medications as of 10/22/2020  Medication Sig  . azelastine (ASTELIN) 0.1 % nasal spray Place 1 spray into both nostrils 2 (two) times daily.  . fludrocortisone (FLORINEF) 0.1 MG tablet TAKE 1/2 TABLET BY MOUTH DAILY (Patient taking differently: Take 0.05 mg by mouth daily.)  . fluticasone (FLONASE SENSIMIST) 27.5 MCG/SPRAY nasal spray Place 2 sprays into the nose daily as needed for rhinitis.  Marland Kitchen HYDROcodone-acetaminophen (NORCO) 7.5-325 MG tablet Take 1 tablet by mouth every 8 (eight) hours as needed for severe pain. Must last 30 days.  . hydrocortisone (CORTEF) 10 MG tablet TAKE 1 & 1/2 TABLETS (15 MG) BY MOUTH DAILY WITH BREAKFAST AND 1/2 TABLETS (5 MG) DAILY (IN THE EVENING). (Patient taking differently: Take 5-15 mg  by mouth See admin instructions. Take 1 tablets (15mg ) by mouth every morning and take  tablet (5mg ) by mouth every evening)  . levocetirizine (XYZAL) 5 MG tablet Take 5 mg by mouth every evening.  . meloxicam (MOBIC) 15 MG tablet Take 1 tablet (15 mg total) by mouth daily.  . pseudoephedrine (SUDAFED) 30 MG tablet Take 30 mg by mouth every 12 (twelve) hours as needed for congestion.   Marland Kitchen tiZANidine (ZANAFLEX) 4 MG tablet Take 1 tablet (4 mg total) by mouth every 8 (eight) hours as needed for muscle spasms.  . [DISCONTINUED] tiZANidine (ZANAFLEX) 4 MG capsule Take 1 capsule (4 mg total) by mouth 3 (three) times daily. (Patient taking differently: Take 4 mg by mouth 3 (three) times daily as needed for muscle spasms.)  . HYDROcodone-acetaminophen (NORCO) 7.5-325 MG tablet Take 1 tablet by mouth every 8 (eight) hours as needed for severe pain. Must last 30 days.  Marland Kitchen HYDROcodone-acetaminophen (NORCO) 7.5-325 MG tablet Take 1 tablet by mouth every 8 (eight) hours as needed for severe pain. Must last 30 days.   No facility-administered encounter medications on file as of 10/22/2020.   Time spent with the patient was approximately 45 minutes. This time included reviewing progress notes, labs, imaging studies, and discussing plan for follow up.   Follow-up: Return in about 3 months (around 01/22/2021) for general physical wiht FBW beofre or at time of visit. Marland Kitchen   [name], NP

## 2020-10-23 LAB — CBC WITH DIFFERENTIAL/PLATELET
Basophils Absolute: 0.1 10*3/uL (ref 0.0–0.2)
Basos: 1 %
EOS (ABSOLUTE): 0.1 10*3/uL (ref 0.0–0.4)
Eos: 1 %
Hematocrit: 41.2 % (ref 34.0–46.6)
Hemoglobin: 14 g/dL (ref 11.1–15.9)
Immature Grans (Abs): 0.1 10*3/uL (ref 0.0–0.1)
Immature Granulocytes: 1 %
Lymphocytes Absolute: 2.8 10*3/uL (ref 0.7–3.1)
Lymphs: 25 %
MCH: 31.1 pg (ref 26.6–33.0)
MCHC: 34 g/dL (ref 31.5–35.7)
MCV: 92 fL (ref 79–97)
Monocytes Absolute: 1 10*3/uL — ABNORMAL HIGH (ref 0.1–0.9)
Monocytes: 8 %
Neutrophils Absolute: 7.5 10*3/uL — ABNORMAL HIGH (ref 1.4–7.0)
Neutrophils: 64 %
Platelets: 287 10*3/uL (ref 150–450)
RBC: 4.5 x10E6/uL (ref 3.77–5.28)
RDW: 12.7 % (ref 11.7–15.4)
WBC: 11.5 10*3/uL — ABNORMAL HIGH (ref 3.4–10.8)

## 2020-10-23 LAB — SPECIMEN STATUS REPORT

## 2020-10-23 NOTE — Progress Notes (Signed)
Mildly elevated WBC. Correction of anemia. Waiting on BMP results.

## 2020-10-28 DIAGNOSIS — Z1211 Encounter for screening for malignant neoplasm of colon: Secondary | ICD-10-CM | POA: Insufficient documentation

## 2020-10-28 DIAGNOSIS — Z1231 Encounter for screening mammogram for malignant neoplasm of breast: Secondary | ICD-10-CM | POA: Insufficient documentation

## 2020-10-28 DIAGNOSIS — D509 Iron deficiency anemia, unspecified: Secondary | ICD-10-CM | POA: Insufficient documentation

## 2020-10-28 DIAGNOSIS — N289 Disorder of kidney and ureter, unspecified: Secondary | ICD-10-CM | POA: Insufficient documentation

## 2020-10-28 DIAGNOSIS — Z7689 Persons encountering health services in other specified circumstances: Secondary | ICD-10-CM | POA: Insufficient documentation

## 2020-10-28 NOTE — Progress Notes (Signed)
PROVIDER NOTE: Information contained herein reflects review and annotations entered in association with encounter. Interpretation of such information and data should be left to medically-trained personnel. Information provided to patient can be located elsewhere in the medical record under "Patient Instructions". Document created using STT-dictation technology, any transcriptional errors that may result from process are unintentional.    Patient: Denise Burns  Service Category: E/M  Provider: [name], MD  DOB: [birth year]  DOS: 11/02/2020  Specialty: Interventional Pain Management  MRN: [id number]  Setting: Ambulatory outpatient  PCP: [name], NP  Type: Established Patient    Referring Provider: Elby Beck, FNP  Location: Office  Delivery: Face-to-face     HPI  Denise Burns, a 50 y.o. year old female, is here today because of her Chronic pain syndrome [G89.4]. [name]'s primary complain today is Back Pain Last encounter: My last encounter with her was on 09/21/2020. Pertinent problems: Ms. Giacobbe has Chronic midline low back pain with bilateral sciatica; Chronic low back pain (1ry area of Pain) (Bilateral) (R>L) w/o sciatica; Chronic sacroiliac joint pain (2ry area of Pain) (Right); Chronic pain syndrome; Abnormal MRI, lumbar spine (03/22/2018); Lumbar facet hypertrophy (Multilevel) (Bilateral); Lumbar facet syndrome (Bilateral); DDD (degenerative disc disease), lumbosacral; Spondylolisthesis (8 mm) at L5-S1 level; Lumbar Grade 1 Retrolisthesis (2 mm) of L2/L3; L5-S1 pars defect w/ spondylolisthesis (Bilateral); Lumbar pars defect (L5-S1) (Bilateral); Lumbar foraminal stenosis (Left: L2-3) (Bilateral: L5-S1); Osteoarthritis of sacroiliac joint (Bilateral); Chronic musculoskeletal pain; Neurogenic pain; Osteoarthritis of facet joint of lumbar spine; Greater trochanteric bursitis (Bilateral); Lumbar spondylosis; Spondylosis without myelopathy or radiculopathy,  lumbosacral region; Chronic hip pain (Right); Osteoarthritis of hip (Right); Lumbar radiculitis (L1/L2) (Right); Chronic groin pain (Right); Chronic hip pain (Bilateral); Enthesopathy hip region (Left); Primary osteoarthritis of both hips; Kissing spine syndrome; and Chronic low back pain (Midline) w/o sciatica on their pertinent problem list. Pain Assessment: Severity of Chronic pain is reported as a 3 /10. Location: Back Right,Left/Denies. Onset: More than a month ago. Quality: Aching. Timing: Intermittent. Modifying factor(s): meds and laying down. Vitals:  height is _0  (1.626 m) and weight is 186 lb (84.4 kg). Her temperature is 97.1 F (36.2 C) (abnormal). Her blood pressure is 146/80 (abnormal) and her pulse is 81. Her oxygen saturation is 100%.   Reason for encounter: medication management.   The patient indicates doing well with the current medication regimen. No adverse reactions or side effects reported to the medications.   RTCB: 02/05/2021 Nonopioids transferred 05/11/2020: Baclofen and Mobic  Pharmacotherapy Assessment   Analgesic: Hydrocodone/APAP 7.5/325 1 tablet p.o. 3 times daily (22.5 mg/day of hydrocodone)  MME/day:22.2m/day.   Monitoring: [place] PMP: PDMP reviewed during this encounter.       Pharmacotherapy: No side-effects or adverse reactions reported. Compliance: No problems identified. Effectiveness: Clinically acceptable.  BChauncey Fischer RN  11/02/2020 10:32 AM  Incomplete Nursing Pain Medication Assessment:  Safety precautions to be maintained throughout the outpatient stay will include: orient to surroundings, keep bed in low position, maintain call bell within reach at all times, provide assistance with transfer out of bed and ambulation.  Medication Inspection Compliance: Pill count conducted under aseptic conditions, in front of the patient. Neither the pills nor the bottle was removed from the patient's sight at any time. Once count was completed pills were  immediately returned to the patient in their original bottle.  Medication: Hydrocodone/APAP Pill/Patch Count: 12 1/2 of 90 pills remain Pill/Patch Appearance: Markings consistent with prescribed medication Bottle  Appearance: Standard pharmacy container. Clearly labeled. Filled Date: 3 / 10 / 22 Last Medication intake:  TodaySafety precautions to be maintained throughout the outpatient stay will include: orient to surroundings, keep bed in low position, maintain call bell within reach at all times, provide assistance with transfer out of bed and ambulation.     UDS:  Summary  Date Value Ref Range Status  02/11/2020 Note  Final    Comment:    ==================================================================== [facility] (MW) ==================================================================== Test                             Result       Flag       Units  Drug Present not Declared for Prescription Verification   Alprazolam                     23           UNEXPECTED ng/mg creat   Alpha-hydroxyalprazolam        16           UNEXPECTED ng/mg creat    Source of alprazolam is a scheduled prescription medication. Alpha-    hydroxyalprazolam is an expected metabolite of alprazolam.    Oxycodone                      2136         UNEXPECTED ng/mg creat   Oxymorphone                    148          UNEXPECTED ng/mg creat   Noroxycodone                   5926         UNEXPECTED ng/mg creat   Noroxymorphone                 324          UNEXPECTED ng/mg creat    Sources of oxycodone are scheduled prescription medications.    Oxymorphone, noroxycodone, and noroxymorphone are expected    metabolites of oxycodone. Oxymorphone is also available as a    scheduled prescription medication.    Butalbital                     PRESENT      UNEXPECTED  Drug Absent but Declared for Prescription Verification   Hydrocodone                    Not Detected UNEXPECTED ng/mg  creat ==================================================================== Test                      Result    Flag   Units      Ref Range   Creatinine              146              mg/dL      >=20 ==================================================================== Declared Medications:  The flagging and interpretation on this report are based on the  following declared medications.  Unexpected results may arise from  inaccuracies in the declared medications.   **Note: The testing scope of this panel includes these medications:   Hydrocodone   **Note: The testing scope of this panel does not include the  following reported medications:  Acetaminophen  Baclofen  Cetirizine  Fludrocortisone  Hydrocortisone  Meloxicam  Pseudoephedrine (Sudafed) ==================================================================== For clinical consultation, please call [phone]. ====================================================================      ROS  Constitutional: Denies any fever or chills Gastrointestinal: No reported hemesis, hematochezia, vomiting, or acute GI distress Musculoskeletal: Denies any acute onset joint swelling, redness, loss of ROM, or weakness Neurological: No reported episodes of acute onset apraxia, aphasia, dysarthria, agnosia, amnesia, paralysis, loss of coordination, or loss of consciousness  Medication Review  HYDROcodone-acetaminophen, azelastine, fludrocortisone, fluticasone, hydrocortisone, levocetirizine, meloxicam, pseudoephedrine, and tiZANidine  History Review  Allergy: Ms. Baldonado is allergic to lisinopril, ultram [tramadol], and sulfa antibiotics. Drug: Ms. Nogueira  reports current drug use. Alcohol:  reports previous alcohol use. Tobacco:  reports that she quit smoking about 26 years ago. She has never used smokeless tobacco. Social: Ms. Falconi  reports that she quit smoking about 26 years ago. She has never used smokeless tobacco. She  reports previous alcohol use. She reports current drug use. Medical:  has a past medical history of Addison disease ([place]), Arthritis, Back pain, and GERD (gastroesophageal reflux disease). Surgical: Ms. Riviere  has a past surgical history that includes Cholecystectomy and Polypectomy. Family: family history includes Alcohol abuse in her father; Arthritis in her mother; Hearing loss in her father; High blood pressure in her father and mother; Hyperlipidemia in her father and mother; Hypertension in her mother.  Laboratory Chemistry Profile   Renal Lab Results  Component Value Date   BUN 11 10/07/2020   CREATININE 0.69 86/76/7209   BCR NOT APPLICABLE 47/04/6282   GFR 72.11 08/13/2019   GFRAA 86 08/16/2018   GFRNONAA >60 10/07/2020     Hepatic Lab Results  Component Value Date   AST 38 10/06/2020   ALT 34 10/06/2020   ALBUMIN 3.8 10/06/2020   ALKPHOS 68 10/06/2020   LIPASE 30 10/06/2020     Electrolytes Lab Results  Component Value Date   NA 140 10/07/2020   K 3.5 10/07/2020   CL 110 10/07/2020   CALCIUM 8.1 (L) 10/07/2020   MG 2.1 08/16/2018     Bone Lab Results  Component Value Date   VD25OH 13.55 (L) 04/30/2018   25OHVITD1 67 08/16/2018   25OHVITD2 <1.0 08/16/2018   25OHVITD3 67 08/16/2018     Inflammation (CRP: Acute Phase) (ESR: Chronic Phase) Lab Results  Component Value Date   CRP 2 08/16/2018   ESRSEDRATE 37 (H) 08/16/2018   LATICACIDVEN 1.4 10/06/2020       Note: Above Lab results reviewed.  Recent Imaging Review  CT Renal Stone Study CLINICAL DATA:  Left lower abdominal/flank and back pain that started last night at 11 p.m., kidney stone suspected.  EXAM: CT ABDOMEN AND PELVIS WITHOUT CONTRAST  TECHNIQUE: Multidetector CT imaging of the abdomen and pelvis was performed following the standard protocol without IV contrast.  COMPARISON:  None.  FINDINGS: Lower chest: No acute abnormality. Normal size heart. No  pericardial effusion.  Hepatobiliary: Unremarkable noncontrast appearance of the hepatic parenchyma. Gallbladder surgically absent. No biliary ductal dilation.  Pancreas: Unremarkable  Spleen: Unremarkable  Adrenals/Urinary Tract: Bilateral adrenal glands are unremarkable.  Nonobstructive right lower pole renal calculus which measures 1.1 cm on image 76/5. Nonobstructive staghorn type left upper pole renal calculus which measures 3.0 cm on image 87/5. There is perinephric and proximal periureteric stranding on the left. Urinary bladder is grossly unremarkable for degree of distension.  Stomach/Bowel: Stomach is within normal limits. Appendix appears normal. Colonic diverticulosis without findings of acute diverticulitis.  No evidence of bowel wall thickening, distention, or inflammatory changes.  Vascular/Lymphatic: Aortic atherosclerosis. No enlarged abdominal or pelvic lymph nodes.  Reproductive: Uterus and right adnexa are unremarkable. There is a 2.6 cm left adnexal cyst. No follow-up imaging recommended. Note: This recommendation does not apply to premenarchal patients and to those with increased risk (genetic, family history, elevated tumor markers or other high-risk factors) of ovarian cancer. Reference: JACR 2020 Feb; 17(2):248-254  Other: No large volume abdominopelvic ascites.  Musculoskeletal: Multilevel degenerative changes spine. T10 and L3 vertebral body hemangiomas. Bilateral L5 pars defects with grade 1 anterolisthesis of L5 on S1. No acute osseous abnormality.  IMPRESSION: 1. Nonobstructive staghorn type LEFT upper pole renal calculus which measures 3.0 cm, with perinephric and proximal periureteric stranding on the LEFT. Findings could be secondary to a recently passed stone or urinary tract infection. Correlate with urinalysis. 2. Nonobstructive right nephrolithiasis. 3. Colonic diverticulosis without findings of acute diverticulitis. 4. Bilateral L5  pars defects with grade 1 anterolisthesis of L5 on S1. 5. Aortic atherosclerosis.  Aortic Atherosclerosis (ICD10-I70.0).  Electronically Signed   By: Dahlia Bailiff MD   On: 10/06/2020 11:49 Note: Reviewed        Physical Exam  General appearance: Well nourished, well developed, and well hydrated. In no apparent acute distress Mental status: Alert, oriented x 3 (person, place, & time)       Respiratory: No evidence of acute respiratory distress Eyes: PERLA Vitals: BP (!) 146/80   Pulse 81   Temp (!) 97.1 F (36.2 C)   Ht _0  (1.626 m)   Wt 186 lb (84.4 kg)   LMP 06/08/2018 (Exact Date)   SpO2 100%   BMI 31.93 kg/m  BMI: Estimated body mass index is 31.93 kg/m as calculated from the following:   Height as of this encounter: _1  (1.626 m).   Weight as of this encounter: 186 lb (84.4 kg). Ideal: Ideal body weight: 54.7 kg (120 lb 9.5 oz) Adjusted ideal body weight: 66.6 kg (146 lb 12.1 oz)  Assessment   Status Diagnosis  Controlled Controlled Controlled 1. Chronic pain syndrome   2. Chronic low back pain (Midline) w/o sciatica   3. Chronic sacroiliac joint pain (2ry area of Pain) (Right)   4. Lumbar Grade 1 Retrolisthesis (2 mm) of L2/L3   5. Lumbar facet syndrome (Bilateral)   6. Pharmacologic therapy   7. Chronic use of opiate for therapeutic purpose   8. Uncomplicated opioid dependence ([place])      Updated Problems: Problem  Chronic Use of Opiate for Therapeutic Purpose  Uncomplicated Opioid Dependence ([facility])  Postmenopausal Bleeding  Acute Postoperative Pain (Resolved)    Plan of Care  Problem-specific:  No problem-specific Assessment & Plan notes found for this encounter.  Ms. Alayah Knouff has a current medication list which includes the following long-term medication(s): azelastine, azelastine, flonase sensimist, [START ON 11/07/2020] hydrocodone-acetaminophen, [START ON 12/07/2020] hydrocodone-acetaminophen, [START ON 01/06/2021]  hydrocodone-acetaminophen, levocetirizine, and meloxicam.  Pharmacotherapy (Medications Ordered): Meds ordered this encounter  Medications  . HYDROcodone-acetaminophen (NORCO) 7.5-325 MG tablet    Sig: Take 1 tablet by mouth every 8 (eight) hours as needed for severe pain. Must last 30 days.    Dispense:  90 tablet    Refill:  0    Not a duplicate. Do NOT delete! Chronic Pain: STOP Act NOT applicable. Fill 1 day early if closed on refill date. Avoid benzodiazepines within 8 hours of opioids. Do not send refill requests.  Marland Kitchen HYDROcodone-acetaminophen (  NORCO) 7.5-325 MG tablet    Sig: Take 1 tablet by mouth every 8 (eight) hours as needed for severe pain. Must last 30 days.    Dispense:  90 tablet    Refill:  0    Not a duplicate. Do NOT delete! Chronic Pain: STOP Act NOT applicable. Fill 1 day early if closed on refill date. Avoid benzodiazepines within 8 hours of opioids. Do not send refill requests.  Marland Kitchen HYDROcodone-acetaminophen (NORCO) 7.5-325 MG tablet    Sig: Take 1 tablet by mouth every 8 (eight) hours as needed for severe pain. Must last 30 days.    Dispense:  90 tablet    Refill:  0    Not a duplicate. Do NOT delete! Chronic Pain: STOP Act NOT applicable. Fill 1 day early if closed on refill date. Avoid benzodiazepines within 8 hours of opioids. Do not send refill requests.   Orders:  No orders of the defined types were placed in this encounter.  Follow-up plan:   Return in about 3 months (around 02/05/2021) for (F2F), (MM).      Considering: Diagnostic L2-3 L-FCT BLK #1  [place] joint Blk PossiblebilateralSI joint RFA Diagnostic bilateral L5 TFESI Diagnostic left-sided L2 TFESI   PRN Procedures: Palliative bilateral lumbar facet block #3 Palliative right lumbar facet RFA #2 (last done 01/07/2020) Palliative left lumbar facet RFA #2 (last done 02/04/2020)            Recent Visits Date Type Provider Dept  09/21/20 Office Visit Milinda Pointer, MD  [facility]  09/08/20 Procedure visit Milinda Pointer, MD [facility]  08/31/20 Office Visit Milinda Pointer, MD [facility]  08/20/20 Procedure visit Milinda Pointer, MD [facility]  Showing recent visits within past 90 days and meeting all other requirements Today's Visits Date Type Provider Dept  11/02/20 Office Visit Milinda Pointer, MD [facility]  Showing today's visits and meeting all other requirements Future Appointments Date Type Provider Dept  01/27/21 Appointment Milinda Pointer, MD [facility]  Showing future appointments within next 90 days and meeting all other requirements  I discussed the assessment and treatment plan with the patient. The patient was provided an opportunity to ask questions and all were answered. The patient agreed with the plan and demonstrated an understanding of the instructions.  Patient advised to call back or seek an in-person evaluation if the symptoms or condition worsens.  Duration of encounter: 30 minutes.  Note by: [name], MD Date: 11/02/2020; Time: 11:21 AM

## 2020-11-02 ENCOUNTER — Other Ambulatory Visit: Payer: Self-pay

## 2020-11-02 ENCOUNTER — Ambulatory Visit: Payer: BC Managed Care – PPO | Attending: Pain Medicine | Admitting: Pain Medicine

## 2020-11-02 ENCOUNTER — Encounter: Payer: Self-pay | Admitting: Pain Medicine

## 2020-11-02 VITALS — BP 146/80 | HR 81 | Temp 97.1°F | Ht 64.0 in | Wt 186.0 lb

## 2020-11-02 DIAGNOSIS — Z79891 Long term (current) use of opiate analgesic: Secondary | ICD-10-CM | POA: Diagnosis not present

## 2020-11-02 DIAGNOSIS — G8929 Other chronic pain: Secondary | ICD-10-CM | POA: Insufficient documentation

## 2020-11-02 DIAGNOSIS — M47816 Spondylosis without myelopathy or radiculopathy, lumbar region: Secondary | ICD-10-CM | POA: Insufficient documentation

## 2020-11-02 DIAGNOSIS — N950 Postmenopausal bleeding: Secondary | ICD-10-CM | POA: Insufficient documentation

## 2020-11-02 DIAGNOSIS — Z79899 Other long term (current) drug therapy: Secondary | ICD-10-CM | POA: Diagnosis not present

## 2020-11-02 DIAGNOSIS — M533 Sacrococcygeal disorders, not elsewhere classified: Secondary | ICD-10-CM | POA: Diagnosis not present

## 2020-11-02 DIAGNOSIS — F1120 Opioid dependence, uncomplicated: Secondary | ICD-10-CM | POA: Diagnosis not present

## 2020-11-02 DIAGNOSIS — M545 Low back pain, unspecified: Secondary | ICD-10-CM | POA: Diagnosis not present

## 2020-11-02 DIAGNOSIS — G894 Chronic pain syndrome: Secondary | ICD-10-CM | POA: Diagnosis not present

## 2020-11-02 DIAGNOSIS — M4310 Spondylolisthesis, site unspecified: Secondary | ICD-10-CM | POA: Diagnosis not present

## 2020-11-02 DIAGNOSIS — N859 Noninflammatory disorder of uterus, unspecified: Secondary | ICD-10-CM | POA: Diagnosis not present

## 2020-11-02 DIAGNOSIS — N83202 Unspecified ovarian cyst, left side: Secondary | ICD-10-CM | POA: Diagnosis not present

## 2020-11-02 MED ORDER — HYDROCODONE-ACETAMINOPHEN 7.5-325 MG PO TABS
1.0000 | ORAL_TABLET | Freq: Three times a day (TID) | ORAL | 0 refills | Status: DC | PRN
  Filled 2020-12-07: qty 90, 30d supply, fill #0

## 2020-11-02 MED ORDER — HYDROCODONE-ACETAMINOPHEN 7.5-325 MG PO TABS
1.0000 | ORAL_TABLET | Freq: Three times a day (TID) | ORAL | 0 refills | Status: DC | PRN
  Filled 2021-01-06: qty 90, 30d supply, fill #0

## 2020-11-02 MED ORDER — HYDROCODONE-ACETAMINOPHEN 7.5-325 MG PO TABS
1.0000 | ORAL_TABLET | Freq: Three times a day (TID) | ORAL | 0 refills | Status: DC | PRN
  Filled 2020-11-06: qty 90, 30d supply, fill #0

## 2020-11-02 NOTE — Progress Notes (Signed)
Nursing Pain Medication Assessment:  Safety precautions to be maintained throughout the outpatient stay will include: orient to surroundings, keep bed in low position, maintain call bell within reach at all times, provide assistance with transfer out of bed and ambulation.  Medication Inspection Compliance: Pill count conducted under aseptic conditions, in front of the patient. Neither the pills nor the bottle was removed from the patient's sight at any time. Once count was completed pills were immediately returned to the patient in their original bottle.  Medication: Hydrocodone/APAP Pill/Patch Count: 12 1/2 of 90 pills remain Pill/Patch Appearance: Markings consistent with prescribed medication Bottle Appearance: Standard pharmacy container. Clearly labeled. Filled Date: 3 / 10 / 22 Last Medication intake:  TodaySafety precautions to be maintained throughout the outpatient stay will include: orient to surroundings, keep bed in low position, maintain call bell within reach at all times, provide assistance with transfer out of bed and ambulation.

## 2020-11-02 NOTE — Patient Instructions (Signed)
____________________________________________________________________________________________  Medication Recommendations and Reminders  Applies to: All patients receiving prescriptions (written and/or electronic).  Medication Rules & Regulations: These rules and regulations exist for your safety and that of others. They are not flexible and neither are we. Dismissing or ignoring them will be considered "non-compliance" with medication therapy, resulting in complete and irreversible termination of such therapy. (See document titled "Medication Rules" for more details.) In all conscience, because of safety reasons, we cannot continue providing a therapy where the patient does not follow instructions.  Pharmacy of record:   Definition: This is the pharmacy where your electronic prescriptions will be sent.   We do not endorse any particular pharmacy, however, we have experienced problems with [facility] not securing enough medication supply for the community.  We do not restrict you in your choice of pharmacy. However, once we write for your prescriptions, we will NOT be re-sending more prescriptions to fix restricted supply problems created by your pharmacy, or your insurance.   The pharmacy listed in the electronic medical record should be the one where you want electronic prescriptions to be sent.  If you choose to change pharmacy, simply notify our nursing staff.  Recommendations:  Keep all of your pain medications in a safe place, under lock and key, even if you live alone. We will NOT replace lost, stolen, or damaged medication.  After you fill your prescription, take 1 week's worth of pills and put them away in a safe place. You should keep a separate, properly labeled bottle for this purpose. The remainder should be kept in the original bottle. Use this as your primary supply, until it runs out. Once it's gone, then you know that you have 1 week's worth of medicine, and it is time to come  in for a prescription refill. If you do this correctly, it is unlikely that you will ever run out of medicine.  To make sure that the above recommendation works, it is very important that you make sure your medication refill appointments are scheduled at least 1 week before you run out of medicine. To do this in an effective manner, make sure that you do not leave the office without scheduling your next medication management appointment. Always ask the nursing staff to show you in your prescription , when your medication will be running out. Then arrange for the receptionist to get you a return appointment, at least 7 days before you run out of medicine. Do not wait until you have 1 or 2 pills left, to come in. This is very poor planning and does not take into consideration that we may need to cancel appointments due to bad weather, sickness, or emergencies affecting our staff.  DO NOT ACCEPT A "Partial Fill": If for any reason your pharmacy does not have enough pills/tablets to completely fill or refill your prescription, do not allow for a "partial fill". The law allows the pharmacy to complete that prescription within 72 hours, without requiring a new prescription. If they do not fill the rest of your prescription within those 72 hours, you will need a separate prescription to fill the remaining amount, which we will NOT provide. If the reason for the partial fill is your insurance, you will need to talk to the pharmacist about payment alternatives for the remaining tablets, but again, DO NOT ACCEPT A PARTIAL FILL, unless you can trust your pharmacist to obtain the remainder of the pills within 72 hours.  Prescription refills and/or changes in medication(s):     Prescription refills, and/or changes in dose or medication, will be conducted only during scheduled medication management appointments. (Applies to both, written and electronic prescriptions.)  No refills on procedure days. No medication will be  changed or started on procedure days. No changes, adjustments, and/or refills will be conducted on a procedure day. Doing so will interfere with the diagnostic portion of the procedure.  No phone refills. No medications will be "called into the pharmacy".  No Fax refills.  No weekend refills.  No Holliday refills.  No after hours refills.  Remember:  Business hours are:  Monday to Thursday 8:00 AM to 4:00 PM Provider's Schedule: Tayten Bergdoll, MD - Appointments are:  Medication management: Monday and Wednesday 8:00 AM to 4:00 PM Procedure day: Tuesday and Thursday 7:30 AM to 4:00 PM Bilal Lateef, MD - Appointments are:  Medication management: Tuesday and Thursday 8:00 AM to 4:00 PM Procedure day: Monday and Wednesday 7:30 AM to 4:00 PM (Last update: 02/19/2020) ____________________________________________________________________________________________   ____________________________________________________________________________________________  CBD (cannabidiol) WARNING  Applicable to: All individuals currently taking or considering taking CBD (cannabidiol) and, more important, all patients taking opioid analgesic controlled substances (pain medication). (Example: oxycodone; oxymorphone; hydrocodone; hydromorphone; morphine; methadone; tramadol; tapentadol; fentanyl; buprenorphine; butorphanol; dextromethorphan; meperidine; codeine; etc.)  Legal status: CBD remains a Schedule I drug prohibited for any use. CBD is illegal with one exception. In the [place], CBD has a limited Food and Drug Administration (FDA) approval for the treatment of two specific types of epilepsy disorders. Only one CBD product has been approved by the FDA for this purpose: "Epidiolex". FDA is aware that some companies are marketing products containing cannabis and cannabis-derived compounds in ways that violate the Federal Food, Drug and Cosmetic Act (FD&C Act) and that may put the health and safety  of consumers at risk. The FDA, a Federal agency, has not enforced the CBD status since 2018.   Legality: Some manufacturers ship CBD products nationally, which is illegal. Often such products are sold online and are therefore available throughout the country. CBD is openly sold in head shops and health food stores in some states where such sales have not been explicitly legalized. Selling unapproved products with unsubstantiated therapeutic claims is not only a violation of the law, but also can put patients at risk, as these products have not been proven to be safe or effective. Federal illegality makes it difficult to conduct research on CBD.  Reference: "FDA Regulation of Cannabis and Cannabis-Derived Products, Including Cannabidiol (CBD)" - https://www.fda.gov/news-events/public-health-focus/fda-regulation-cannabis-and-cannabis-derived-products-including-cannabidiol-cbd  Warning: CBD is not FDA approved and has not undergo the same manufacturing controls as prescription drugs.  This means that the purity and safety of available CBD may be questionable. Most of the time, despite manufacturer's claims, it is contaminated with THC (delta-9-tetrahydrocannabinol - the chemical in marijuana responsible for the "HIGH").  When this is the case, the THC contaminant will trigger a positive urine drug screen (UDS) test for Marijuana (carboxy-THC). Because a positive UDS for any illicit substance is a violation of our medication agreement, your opioid analgesics (pain medicine) may be permanently discontinued.  MORE ABOUT CBD  General Information: CBD  is a derivative of the Marijuana (cannabis sativa) plant discovered in 1940. It is one of the 113 identified substances found in Marijuana. It accounts for up to 40% of the plant's extract. As of 2018, preliminary clinical studies on CBD included research for the treatment of anxiety, movement disorders, and pain. CBD is available and consumed in multiple forms,  including   inhalation of smoke or vapor, as an aerosol spray, and by mouth. It may be supplied as an oil containing CBD, capsules, dried cannabis, or as a liquid solution. CBD is thought not to be as psychoactive as THC (delta-9-tetrahydrocannabinol - the chemical in marijuana responsible for the "HIGH"). Studies suggest that CBD may interact with different biological target receptors in the body, including cannabinoid and other neurotransmitter receptors. As of 2018 the mechanism of action for its biological effects has not been determined.  Side-effects  Adverse reactions: Dry mouth, diarrhea, decreased appetite, fatigue, drowsiness, malaise, weakness, sleep disturbances, and others.  Drug interactions: CBC may interact with other medications such as blood-thinners. (Last update: 03/07/2020) ____________________________________________________________________________________________   ____________________________________________________________________________________________  Medication Rules  Purpose: To inform patients, and their family members, of our rules and regulations.  Applies to: All patients receiving prescriptions (written or electronic).  Pharmacy of record: Pharmacy where electronic prescriptions will be sent. If written prescriptions are taken to a different pharmacy, please inform the nursing staff. The pharmacy listed in the electronic medical record should be the one where you would like electronic prescriptions to be sent.  Electronic prescriptions: In compliance with the  Strengthen Opioid Misuse Prevention (STOP) Act of 2017 (Session Law 2017-74/H243), effective August 01, 2018, all controlled substances must be electronically prescribed. Calling prescriptions to the pharmacy will cease to exist.  Prescription refills: Only during scheduled appointments. Applies to all prescriptions.  NOTE: The following applies primarily to controlled substances (Opioid*  Pain Medications).   Type of encounter (visit): For patients receiving controlled substances, face-to-face visits are required. (Not an option or up to the patient.)  Patient's responsibilities: 1. Pain Pills: Bring all pain pills to every appointment (except for procedure appointments). 2. Pill Bottles: Bring pills in original pharmacy bottle. Always bring the newest bottle. Bring bottle, even if empty. 3. Medication refills: You are responsible for knowing and keeping track of what medications you take and those you need refilled. The day before your appointment: write a list of all prescriptions that need to be refilled. The day of the appointment: give the list to the admitting nurse. Prescriptions will be written only during appointments. No prescriptions will be written on procedure days. If you forget a medication: it will not be "Called in", "Faxed", or "electronically sent". You will need to get another appointment to get these prescribed. No early refills. Do not call asking to have your prescription filled early. 4. Prescription Accuracy: You are responsible for carefully inspecting your prescriptions before leaving our office. Have the discharge nurse carefully go over each prescription with you, before taking them home. Make sure that your name is accurately spelled, that your address is correct. Check the name and dose of your medication to make sure it is accurate. Check the number of pills, and the written instructions to make sure they are clear and accurate. Make sure that you are given enough medication to last until your next medication refill appointment. 5. Taking Medication: Take medication as prescribed. When it comes to controlled substances, taking less pills or less frequently than prescribed is permitted and encouraged. Never take more pills than instructed. Never take medication more frequently than prescribed.  6. Inform other Doctors: Always inform, all of your  healthcare providers, of all the medications you take. 7. Pain Medication from other Providers: You are not allowed to accept any additional pain medication from any other Doctor or Healthcare provider. There are two exceptions to this rule. (see below) In   the event that you require additional pain medication, you are responsible for notifying us, as stated below. 8. Cough Medicine: Often these contain an opioid, such as codeine or hydrocodone. Never accept or take cough medicine containing these opioids if you are already taking an opioid* medication. The combination may cause respiratory failure and death. 9. Medication Agreement: You are responsible for carefully reading and following our Medication Agreement. This must be signed before receiving any prescriptions from our practice. Safely store a copy of your signed Agreement. Violations to the Agreement will result in no further prescriptions. (Additional copies of our Medication Agreement are available upon request.) 10. Laws, Rules, & Regulations: All patients are expected to follow all Federal and State Laws, Statutes, Rules, & Regulations. Ignorance of the Laws does not constitute a valid excuse.  11. Illegal drugs and Controlled Substances: The use of illegal substances (including, but not limited to marijuana and its derivatives) and/or the illegal use of any controlled substances is strictly prohibited. Violation of this rule may result in the immediate and permanent discontinuation of any and all prescriptions being written by our practice. The use of any illegal substances is prohibited. 12. Adopted CDC guidelines & recommendations: Target dosing levels will be at or below 60 MME/day. Use of benzodiazepines** is not recommended.  Exceptions: There are only two exceptions to the rule of not receiving pain medications from other Healthcare Providers. 1. Exception #1 (Emergencies): In the event of an emergency (i.e.: accident requiring emergency  care), you are allowed to receive additional pain medication. However, you are responsible for: As soon as you are able, call our office [phone], at any time of the day or night, and leave a message stating your name, the date and nature of the emergency, and the name and dose of the medication prescribed. In the event that your call is answered by a member of our staff, make sure to document and save the date, time, and the name of the person that took your information.  2. Exception #2 (Planned Surgery): In the event that you are scheduled by another doctor or dentist to have any type of surgery or procedure, you are allowed (for a period no longer than 30 days), to receive additional pain medication, for the acute post-op pain. However, in this case, you are responsible for picking up a copy of our "Post-op Pain Management for Surgeons" handout, and giving it to your surgeon or dentist. This document is available at our office, and does not require an appointment to obtain it. Simply go to our office during business hours (Monday-Thursday from 8:00 AM to 4:00 PM) (Friday 8:00 AM to 12:00 Noon) or if you have a scheduled appointment with us, prior to your surgery, and ask for it by name. In addition, you are responsible for: calling our office [phone], at any time of the day or night, and leaving a message stating your name, name of your surgeon, type of surgery, and date of procedure or surgery. Failure to comply with your responsibilities may result in termination of therapy involving the controlled substances.  *Opioid medications include: morphine, codeine, oxycodone, oxymorphone, hydrocodone, hydromorphone, meperidine, tramadol, tapentadol, buprenorphine, fentanyl, methadone. **Benzodiazepine medications include: diazepam (Valium), alprazolam (Xanax), clonazepam (Klonopine), lorazepam (Ativan), clorazepate (Tranxene), chlordiazepoxide (Librium), estazolam (Prosom), oxazepam (Serax),  temazepam (Restoril), triazolam (Halcion) (Last updated: 06/29/2020) ____________________________________________________________________________________________    

## 2020-11-03 ENCOUNTER — Encounter (HOSPITAL_BASED_OUTPATIENT_CLINIC_OR_DEPARTMENT_OTHER): Payer: Self-pay

## 2020-11-06 ENCOUNTER — Telehealth: Payer: Self-pay | Admitting: Pain Medicine

## 2020-11-06 ENCOUNTER — Telehealth: Payer: Self-pay

## 2020-11-06 ENCOUNTER — Other Ambulatory Visit: Payer: Self-pay

## 2020-11-06 NOTE — Telephone Encounter (Signed)
Spoke with pharmacist and she states that she was able to get Epic to fix the problem so Dr Dossie Arbour would not have to resend the prescriptions.  LM for patient and informed Dr Dossie Arbour.

## 2020-11-06 NOTE — Telephone Encounter (Signed)
Patient says the dates on her meds are wrong, please check and call patient

## 2020-11-06 NOTE — Telephone Encounter (Signed)
Spoke with Dr Dossie Arbour and bubble message was sent informing him that the prescription needed to be resent with todays date on it.

## 2020-11-06 NOTE — Telephone Encounter (Signed)
Spoke with pharmacist and she states because they went to Epic system she cant fill the prescription until the fill date tomorrow even thought the pharmacy is closed tomorrow.  There is a note on the prescription that states that if the pharmacy is closed that the prescription may be filled a day early, but she states that there is a hard stop and will not let her fill it until tomorrow.  The pharmacist is going to call Epic coordinator and see if there is a way to bypass that and call me back.   I will notify Dr Dossie Arbour

## 2020-11-10 ENCOUNTER — Encounter: Payer: Self-pay | Admitting: Urology

## 2020-11-10 ENCOUNTER — Ambulatory Visit: Payer: BC Managed Care – PPO | Admitting: Urology

## 2020-11-10 ENCOUNTER — Other Ambulatory Visit: Payer: Self-pay

## 2020-11-10 VITALS — BP 135/85 | HR 83 | Ht 64.0 in | Wt 190.0 lb

## 2020-11-10 DIAGNOSIS — N200 Calculus of kidney: Secondary | ICD-10-CM

## 2020-11-10 DIAGNOSIS — Z87448 Personal history of other diseases of urinary system: Secondary | ICD-10-CM | POA: Diagnosis not present

## 2020-11-10 NOTE — Patient Instructions (Signed)
Percutaneous Nephrolithotomy Percutaneous nephrolithotomy is a procedure to remove kidney stones. Kidney stones are deposits that form inside your kidneys and can cause pain. You may need this procedure if:  You have large kidney stones. Kidney stones that are bigger than 2 cm (0.78 in.) wide may require this procedure.  Your kidney stones are oddly shaped.  Other treatments have not been successful in helping the kidney stones to pass.  You have developed an infection due to the kidney stones. Tell a health care provider about:  Any allergies you have.  All medicines you are taking, including vitamins, herbs, eye drops, creams, and over-the-counter medicines.  Any problems you or family members have had with anesthetic medicines.  Any blood disorders you have.  Any surgeries you have had.  Any medical conditions you have.  Whether you are pregnant or may be pregnant.  Whether you use any tobacco products, including cigarettes, chewing tobacco, or e-cigarettes. What are the risks? Generally, this is a safe procedure. However, problems may occur, including:  Infection.  Bleeding. This may include blood in your urine.  Allergic reactions to medicines.  Damage to other structures or organs.  Kidney damage.  Holes in the kidney. These often heal on their own.  Numbness or tingling in the affected area.  Inability to remove all the stones. You may need a different procedure to complete treatment. What happens before the procedure? Staying hydrated Follow instructions from your health care provider about hydration, which may include:  Up to 2 hours before the procedure - you may continue to drink clear liquids, such as water, clear fruit juice, black coffee, and plain tea.   Eating and drinking restrictions Follow instructions from your health care provider about eating and drinking, which may include:  8 hours before the procedure - stop eating heavy meals or foods,  such as meat, fried foods, or fatty foods.  6 hours before the procedure - stop eating light meals or foods, such as toast or cereal.  6 hours before the procedure - stop drinking milk or drinks that contain milk.  2 hours before the procedure - stop drinking clear liquids. Medicines Ask your health care provider about:  Changing or stopping your regular medicines. This is especially important if you are taking diabetes medicines or blood thinners.  Taking medicines such as aspirin and ibuprofen. These medicines can thin your blood. Do not take these medicines unless your health care provider tells you to take them.  Taking over-the-counter medicines, vitamins, herbs, and supplements. Tests You may have tests, including:  Blood tests.  Urine tests.  Tests to check how your heart is working.  Imaging studies. These are used to identify: ? The size and number (stone burden) of the kidney stones. ? The position of the kidney stones. General instructions  Plan to have someone take you home from the hospital or clinic.  Plan to have a responsible adult care for you for at least 24 hours after you leave the hospital or clinic. This is important.  Ask your health care provider how your surgical site will be marked or identified.  Ask your health care provider what steps will be taken to help prevent infection. These may include: ? Removing hair at the surgery site. ? Washing skin with a germ-killing soap. ? Taking antibiotic medicine. What happens during the procedure?  An IV will be inserted into one of your veins.  The site of the procedure will be marked.  You will be  given one or more of the following: ? A medicine to help you relax (sedative). ? A medicine to numb the area (local anesthetic). ? A medicine to make you fall asleep (general anesthetic). ? A medicine that is injected into your spine to numb the area below and slightly above the injection site (spinal  anesthetic). ? A medicine that is injected into an area of your body to numb everything below the injection site (regional anesthetic).  A thin tube (urinary catheter) will be put in your bladder to drain urine during and after the procedure.  Your surgeon will make a small cut (incision) in your lower back.  A tube will be inserted through the incision into your kidney.  Each kidney stone will be removed through this tube. Larger stones may need to be broken up with a high-intensity light beam (laser) or other tools.  After all of the stones have been removed, your health care provider may put in tubes to drain your bladder. Based on your condition: ? An internal tube, called a stent, may be put in your ureter. This will help drain urine from your kidney to your bladder. ? A surgical drain (nephrostomy tube) may be put in your kidney. The tube comes out through the incision in your lower back. This will help to drain urine or any fluid that builds up while your kidney heals.  Part of the incision may be closed with stitches (sutures).  A bandage (dressing) will be placed over the incision area. The procedure may vary among health care providers and hospitals.   What happens after the procedure?  Your blood pressure, heart rate, breathing rate, and blood oxygen level will be monitored until you leave the hospital or clinic.  You may be given medicine for pain.  You will be shown how to do breathing exercises, such as coughing and breathing deeply. These will help to prevent pneumonia.  You will be encouraged to walk. Walking helps to prevent blood clots.  Your stent and urinary catheter will be removed after 1-2 days if there is only a small amount of blood in your urine.  You will be taught how to care for the catheter or nephrostomy tube, if you have them.  Do not drive for 24 hours if you were given a sedative during your procedure. Summary  Percutaneous nephrolithotomy is a  procedure to remove kidney stones.  Ask your health care provider about changing or stopping your regular medicines.  Before surgery, follow instructions from your health care provider about eating and drinking.  Plan to have someone take you home from the hospital or clinic. This information is not intended to replace advice given to you by your health care provider. Make sure you discuss any questions you have with your health care provider. Document Revised: 09/13/2018 Document Reviewed: 02/07/2018 Elsevier Patient Education  [place].

## 2020-11-10 NOTE — Progress Notes (Signed)
11/10/2020 5:12 PM   Denise Burns [birth year] [id number]  Referring provider: [name], NP [address],  [place] [address]  Chief Complaint  Patient presents with  . Nephrolithiasis    HPI: 50 year old female with bilateral nephrolithiasis who presents today to discuss management options for a left partial staghorn calculus.  Notably, she was recently admitted with pyelonephritis in March to the hospitalist service.  As part of her work-up, she underwent a noncontrast CT scan which indicated approximately 1.1 cm right lower pole nonobstructing stone as well as a large 3 cm left partial staghorn calculus involving the upper pole moiety with some perinephric stranding without obstruction.  Urine culture during this admission grew pansensitive E. Coli.  Today, she denies any ongoing flank pain, no fevers, dysuria, or chills.  She is a Marine scientist.  She is very educated about this procedure and has been reading in anticipation of her conversation today.  No prior history of stone disease.  She does have a personal history of Addison's disease and is required stress dose steroids intraoperatively TRUS.   PMH: Past Medical History:  Diagnosis Date  . Addison disease ([place])   . Arthritis   . Back pain   . GERD (gastroesophageal reflux disease)     Surgical History: Past Surgical History:  Procedure Laterality Date  . CHOLECYSTECTOMY    . POLYPECTOMY      Home Medications:  Allergies as of 11/10/2020      Reactions   Lisinopril Cough   Tramadol Other (See Comments)   Other reaction(s): muscle tension   Sulfa Antibiotics Other (See Comments)      Medication List       Accurate as of November 10, 2020 11:59 PM. If you have any questions, ask your nurse or doctor.        azelastine 0.1 % nasal spray Commonly known as: ASTELIN Place 1 spray into both nostrils 2 (two) times daily. What changed: Another medication with the same name was  removed. Continue taking this medication, and follow the directions you see here. Changed by: Hollice Espy, MD   Flonase Sensimist 27.5 MCG/SPRAY nasal spray Generic drug: fluticasone Place 2 sprays into the nose daily as needed for rhinitis.   fludrocortisone 0.1 MG tablet Commonly known as: FLORINEF TAKE 1/2 TABLET BY MOUTH DAILY What changed: how much to take   HYDROcodone-acetaminophen 7.5-325 MG tablet Commonly known as: NORCO Take 1 tablet by mouth every 8 (eight) hours as needed for severe pain. Must last 30 days. What changed: Another medication with the same name was removed. Continue taking this medication, and follow the directions you see here. Changed by: Hollice Espy, MD   hydrocortisone 5 MG tablet Commonly known as: CORTEF 1 tablet with food or milk What changed: Another medication with the same name was removed. Continue taking this medication, and follow the directions you see here. Changed by: Hollice Espy, MD   levocetirizine 5 MG tablet Commonly known as: XYZAL Take 5 mg by mouth every evening.   meloxicam 15 MG tablet Commonly known as: MOBIC TAKE 1 TABLET BY MOUTH DAILY   pseudoephedrine 30 MG tablet Commonly known as: SUDAFED Take 30 mg by mouth every 12 (twelve) hours as needed for congestion.   tiZANidine 4 MG tablet Commonly known as: ZANAFLEX TAKE 1 TABLET BY MOUTH EVERY 8 HOURS AS NEEDED FOR MUSCLE SPASMS.       Allergies:  Allergies  Allergen Reactions  . Lisinopril Cough  .  Tramadol Other (See Comments)    Other reaction(s): muscle tension  . Sulfa Antibiotics Other (See Comments)    Family History: Family History  Problem Relation Age of Onset  . Arthritis Mother   . Hyperlipidemia Mother   . Hypertension Mother   . High blood pressure Mother   . Alcohol abuse Father   . Hearing loss Father   . Hyperlipidemia Father   . High blood pressure Father     Social History:  reports that she quit smoking about 26 years  ago. She has never used smokeless tobacco. She reports previous alcohol use. She reports current drug use.   Physical Exam: BP 135/85   Pulse 83   Ht 5\' 4"  (1.626 m)   Wt 190 lb (86.2 kg)   LMP 06/08/2018 (Exact Date)   BMI 32.61 kg/m   Constitutional:  Alert and oriented, No acute distress. HEENT: [place] AT, moist mucus membranes.  Trachea midline, no masses. Cardiovascular: No clubbing, cyanosis, or edema. Respiratory: Normal respiratory effort, no increased work of breathing. Skin: No rashes, bruises or suspicious lesions. Neurologic: Grossly intact, no focal deficits, moving all 4 extremities. Psychiatric: Normal mood and affect.  Laboratory Data: Lab Results  Component Value Date   WBC 11.5 (H) 10/22/2020   HGB 14.0 10/22/2020   HCT 41.2 10/22/2020   MCV 92 10/22/2020   PLT 287 10/22/2020    Lab Results  Component Value Date   CREATININE 0.69 10/07/2020     Lab Results  Component Value Date   HGBA1C 6.1 04/30/2018    Pertinent Imaging: CT Renal Stone Study  Narrative CLINICAL DATA:  Left lower abdominal/flank and back pain that started last night at 11 p.m., kidney stone suspected.  EXAM: CT ABDOMEN AND PELVIS WITHOUT CONTRAST  TECHNIQUE: Multidetector CT imaging of the abdomen and pelvis was performed following the standard protocol without IV contrast.  COMPARISON:  None.  FINDINGS: Lower chest: No acute abnormality. Normal size heart. No pericardial effusion.  Hepatobiliary: Unremarkable noncontrast appearance of the hepatic parenchyma. Gallbladder surgically absent. No biliary ductal dilation.  Pancreas: Unremarkable  Spleen: Unremarkable  Adrenals/Urinary Tract: Bilateral adrenal glands are unremarkable.  Nonobstructive right lower pole renal calculus which measures 1.1 cm on image 76/5. Nonobstructive staghorn type left upper pole renal calculus which measures 3.0 cm on image 87/5. There is perinephric and proximal periureteric stranding  on the left. Urinary bladder is grossly unremarkable for degree of distension.  Stomach/Bowel: Stomach is within normal limits. Appendix appears normal. Colonic diverticulosis without findings of acute diverticulitis. No evidence of bowel wall thickening, distention, or inflammatory changes.  Vascular/Lymphatic: Aortic atherosclerosis. No enlarged abdominal or pelvic lymph nodes.  Reproductive: Uterus and right adnexa are unremarkable. There is a 2.6 cm left adnexal cyst. No follow-up imaging recommended. Note: This recommendation does not apply to premenarchal patients and to those with increased risk (genetic, family history, elevated tumor markers or other high-risk factors) of ovarian cancer. Reference: JACR 2020 Feb; 17(2):248-254  Other: No large volume abdominopelvic ascites.  Musculoskeletal: Multilevel degenerative changes spine. T10 and L3 vertebral body hemangiomas. Bilateral L5 pars defects with grade 1 anterolisthesis of L5 on S1. No acute osseous abnormality.  IMPRESSION: 1. Nonobstructive staghorn type LEFT upper pole renal calculus which measures 3.0 cm, with perinephric and proximal periureteric stranding on the LEFT. Findings could be secondary to a recently passed stone or urinary tract infection. Correlate with urinalysis. 2. Nonobstructive right nephrolithiasis. 3. Colonic diverticulosis without findings of acute diverticulitis. 4. Bilateral L5  pars defects with grade 1 anterolisthesis of L5 on S1. 5. Aortic atherosclerosis.  Aortic Atherosclerosis (ICD10-I70.0).   Electronically Signed By: Dahlia Bailiff MD On: 10/06/2020 11:49  CT scan was personally reviewed.  Assessment & Plan:    1. Staghorn calculus Left partial staghorn calculus measuring 3 cm  Based on size and location of the stone, she likely best be served with left PCNL.  Alternatives including staged ureteroscopy versus staged ESWL were also discussed.  We discussed the risk of  surgery including risk of bleeding, infection, demonstrating structures, including to the lung and spleen amongst others especially given that his upper pole.  We discussed the procedure itself including need for at least overnight observation, risk of transfusion which is about 5% as well as the postoperative wound care.  We also discussed the possible need for staged procedure as well as ureteral stent.  All questions were answered today.  She will need stress test steroids intraoperatively.  2. Right kidney stone We will address #1 for sepsis appears to be the nidus for her most recent infection  Consider ureteroscopy versus ESWL staged fashion with #1 has been addressed  3. History of pyelonephritis Signs symptoms of pyelonephritis reviewed, will recheck a urine culture prior to the procedure.  We also discussed the risk of infected stone as well as intraoperative, postoperative risk of sepsis.  All questions answered.   Return for leah will call for surgery.  Hollice Espy, MD  [facility] [address] [place], [place] [address] [phone]

## 2020-11-12 ENCOUNTER — Other Ambulatory Visit: Payer: Self-pay | Admitting: Urology

## 2020-11-12 DIAGNOSIS — N200 Calculus of kidney: Secondary | ICD-10-CM

## 2020-11-17 ENCOUNTER — Telehealth: Payer: Self-pay | Admitting: Urology

## 2020-11-17 NOTE — Telephone Encounter (Signed)
Pt.called and request a call back to discuss post op expectations. She would like to know when to return to work and what kind of recovery she should expect.

## 2020-11-18 ENCOUNTER — Other Ambulatory Visit: Payer: Self-pay

## 2020-11-18 ENCOUNTER — Other Ambulatory Visit: Payer: Self-pay | Admitting: Nurse Practitioner

## 2020-11-18 DIAGNOSIS — M461 Sacroiliitis, not elsewhere classified: Secondary | ICD-10-CM

## 2020-11-18 DIAGNOSIS — M47816 Spondylosis without myelopathy or radiculopathy, lumbar region: Secondary | ICD-10-CM

## 2020-11-18 MED ORDER — HYDROCORTISONE 10 MG PO TABS
ORAL_TABLET | ORAL | 0 refills | Status: DC
  Filled 2020-11-18: qty 180, 90d supply, fill #0

## 2020-11-18 MED ORDER — MELOXICAM 15 MG PO TABS
15.0000 mg | ORAL_TABLET | Freq: Every day | ORAL | 0 refills | Status: DC
  Filled 2020-11-18: qty 90, 90d supply, fill #0

## 2020-11-18 NOTE — Telephone Encounter (Signed)
Please approve if refill appropriate. AS, CMA

## 2020-11-19 ENCOUNTER — Other Ambulatory Visit: Payer: Self-pay

## 2020-11-19 ENCOUNTER — Telehealth: Payer: Self-pay | Admitting: Nurse Practitioner

## 2020-11-19 ENCOUNTER — Encounter: Payer: Self-pay | Admitting: Gastroenterology

## 2020-11-19 DIAGNOSIS — Z1211 Encounter for screening for malignant neoplasm of colon: Secondary | ICD-10-CM

## 2020-11-19 NOTE — Addendum Note (Signed)
Addended by: Mickel Crow on: 11/19/2020 01:26 PM   Modules accepted: Orders

## 2020-11-19 NOTE — Telephone Encounter (Signed)
Patient would like a referral for a colonoscopy within [facility]. She never heard back from her previous referral. Please advise, thanks.

## 2020-11-19 NOTE — Telephone Encounter (Signed)
Referral has been ordered. AS, CMA

## 2020-11-30 ENCOUNTER — Encounter: Payer: Self-pay | Admitting: Nurse Practitioner

## 2020-11-30 ENCOUNTER — Ambulatory Visit (INDEPENDENT_AMBULATORY_CARE_PROVIDER_SITE_OTHER): Payer: BC Managed Care – PPO | Admitting: Nurse Practitioner

## 2020-11-30 ENCOUNTER — Telehealth: Payer: Self-pay

## 2020-11-30 ENCOUNTER — Other Ambulatory Visit: Payer: Self-pay

## 2020-11-30 ENCOUNTER — Telehealth: Payer: Self-pay | Admitting: Nurse Practitioner

## 2020-11-30 VITALS — BP 120/77 | HR 100 | Temp 98.1°F | Ht 64.0 in | Wt 190.0 lb

## 2020-11-30 DIAGNOSIS — R110 Nausea: Secondary | ICD-10-CM | POA: Insufficient documentation

## 2020-11-30 DIAGNOSIS — R300 Dysuria: Secondary | ICD-10-CM | POA: Insufficient documentation

## 2020-11-30 DIAGNOSIS — R319 Hematuria, unspecified: Secondary | ICD-10-CM | POA: Insufficient documentation

## 2020-11-30 DIAGNOSIS — N200 Calculus of kidney: Secondary | ICD-10-CM | POA: Diagnosis not present

## 2020-11-30 DIAGNOSIS — Z1239 Encounter for other screening for malignant neoplasm of breast: Secondary | ICD-10-CM

## 2020-11-30 DIAGNOSIS — N390 Urinary tract infection, site not specified: Secondary | ICD-10-CM

## 2020-11-30 LAB — POCT URINALYSIS DIPSTICK
Glucose, UA: NEGATIVE
Ketones, UA: 15
Nitrite, UA: NEGATIVE
Protein, UA: POSITIVE — AB
Spec Grav, UA: 1.02 (ref 1.010–1.025)
Urobilinogen, UA: 1 E.U./dL
pH, UA: 6 (ref 5.0–8.0)

## 2020-11-30 MED ORDER — PROMETHAZINE HCL 25 MG PO TABS: 25.0000 mg | ORAL_TABLET | Freq: Three times a day (TID) | ORAL | 0 refills | Status: DC | PRN

## 2020-11-30 MED ORDER — CEPHALEXIN 500 MG PO CAPS: 500.0000 mg | ORAL_CAPSULE | Freq: Three times a day (TID) | ORAL | 0 refills | Status: DC

## 2020-11-30 NOTE — Patient Instructions (Signed)
Pyelonephritis, Adult    Pyelonephritis is an infection that occurs in the kidney. The kidneys are organs that help clean the blood by moving waste out of the blood and into the pee (urine). This infection can happen quickly, or it can last for a long time. In most cases, it clears up with treatment and does not cause other problems.  What are the causes?  This condition may be caused by:  · Germs (bacteria) going from the bladder up to the kidney. This may happen after having a bladder infection.  · Germs going from the blood to the kidney.  What increases the risk?  This condition is more likely to develop in:  · Pregnant women.  · Older people.  · People who have any of these conditions:  ? Diabetes.  ? Inflammation of the prostate gland (prostatitis), in males.  ? Kidney stones or bladder stones.  ? Other problems with the kidney or the parts of your body that carry pee from the kidneys to the bladder (ureters).  ? Cancer.  · People who have a small, thin tube (catheter) placed in the bladder.  · People who are sexually active.  · Women who use a medicine that kills sperm (spermicide) to prevent pregnancy.  · People who have had a prior urinary tract infection (UTI).  What are the signs or symptoms?  Symptoms of this condition include:  · Peeing often.  · A strong urge to pee right away.  · Burning or stinging when peeing.  · Belly pain.  · Back pain.  · Pain in the side (flank area).  · Fever or chills.  · Blood in the pee, or dark pee.  · Feeling sick to your stomach (nauseous) or throwing up (vomiting).  How is this treated?  This condition may be treated by:  · Taking antibiotic medicines by mouth (orally).  · Drinking enough fluids.  If the infection is bad, you may need to stay in the hospital. You may be given antibiotics and fluids that are put directly into a vein through an IV tube.  In some cases, other treatments may be needed.  Follow these instructions at home:  Medicines  · Take your antibiotic  medicine as told by your doctor. Do not stop taking the antibiotic even if you start to feel better.  · Take over-the-counter and prescription medicines only as told by your doctor.  General instructions    · Drink enough fluid to keep your pee pale yellow.  · Avoid caffeine, tea, and carbonated drinks.  · Pee (urinate) often. Avoid holding in pee for long periods of time.  · Pee before and after sex.  · After pooping (having a bowel movement), women should wipe from front to back. Use each tissue only once.  · Keep all follow-up visits as told by your doctor. This is important.  Contact a doctor if:  · You do not feel better after 2 days.  · Your symptoms get worse.  · You have a fever.  Get help right away if:  · You cannot take your medicine or drink fluids as told.  · You have chills and shaking.  · You throw up.  · You have very bad pain in your side or back.  · You feel very weak or you pass out (faint).  Summary  · Pyelonephritis is an infection that occurs in the kidney.  · In most cases, this infection clears up with treatment and does   not cause other problems.  · Take your antibiotic medicine as told by your doctor. Do not stop taking the antibiotic even if you start to feel better.  · Drink enough fluid to keep your pee pale yellow.  This information is not intended to replace advice given to you by your health care provider. Make sure you discuss any questions you have with your health care provider.  Document Revised: 05/22/2018 Document Reviewed: 05/22/2018  Elsevier Patient Education © 2021 [employer]vier Inc.

## 2020-11-30 NOTE — Telephone Encounter (Signed)
Referral placed per standing orders. AS, CMA

## 2020-11-30 NOTE — Telephone Encounter (Signed)
Pt LVM on triage line stating she believes she has a UTI w/ c/o back pain, fever, and nausea.   Called pt to schedule appt. Pt states she is already being seen by PCP.

## 2020-11-30 NOTE — Progress Notes (Signed)
Discussed with patient during visit.

## 2020-11-30 NOTE — Progress Notes (Signed)
Virtual Visit via Telephone Note  I connected with Denise Burns on 11/30/20 at  2:30 PM EDT by telephone and verified that I am speaking with the correct person using two identifiers.  Location: Patient: home Provider: [facility]    I discussed the limitations, risks, security and privacy concerns of performing an evaluation and management service by telephone and the availability of in person appointments. I also discussed with the patient that there may be a patient responsible charge related to this service. The patient expressed understanding and agreed to proceed.   History of Present Illness: The patient states that she developed severe back pain yesterday. This is more severe on right side than left. She states that she was outside in the yard when it started. Had to go inside and lay down. She states that later on, she developed significant nausea without vomiting. She was unable to take any of her usual medications as nausea was so severe. She was not able to eat or drink anything. she did have a fever of 100.7 yesterday. She does have history of significant kidney stones, bilaterally. She is scheduled to have removal of stone from left kidney on 12/14/2020. After she recovers from this removal, she will discuss removal of stones from right kidney.    Observations/Objective:  The patient is alert and oriented. She is pleasant and answers all questions appropriately. Breathing is non-labored. She is in no acute distress at this time. Urine sample today is positive for large amount of WBC and large amount of blood. There is also some protein and ketones present.   Assessment and Plan: 1. Urinary tract infection with hematuria, site unspecified Urine sample positive for RBC and WBC. Start keflex 500mg  TID for 10 days. Send urine sample for culture and sensitivity and adjust antibiotics as indicated.  - cephALEXin (KEFLEX) 500 MG capsule; Take 1 capsule  (500 mg total) by mouth 3 (three) times daily.  Dispense: 30 capsule; Refill: 0  2. Kidney stones Urine positive for large blood today. Patient does see urology and is scheduled to have surgical extraction of left renal stone 12/14/2020.  - POCT urinalysis dipstick - Urine Culture - cephALEXin (KEFLEX) 500 MG capsule; Take 1 capsule (500 mg total) by mouth 3 (three) times daily.  Dispense: 30 capsule; Refill: 0  3. Nausea May take promethazine 25mg  up o three times daily as needed for nausea. Encouraged her to take with caution as this may cause dizziness or drowsiness. She voiced understanding.  - promethazine (PHENERGAN) 25 MG tablet; Take 1 tablet (25 mg total) by mouth every 8 (eight) hours as needed for nausea or vomiting.  Dispense: 20 tablet; Refill: 0  4. Dysuria Urine sample positive or WBC and blood. Send for culture and sensitivity and adjust antibiotics as indicated.  - POCT urinalysis dipstick - Urine Culture  Follow Up Instructions:    I discussed the assessment and treatment plan with the patient. The patient was provided an opportunity to ask questions and all were answered. The patient agreed with the plan and demonstrated an understanding of the instructions.   The patient was advised to call back or seek an in-person evaluation if the symptoms worsen or if the condition fails to improve as anticipated.  I provided 20 minutes of non-face-to-face time during this encounter.   [name], NP

## 2020-12-03 LAB — URINE CULTURE

## 2020-12-04 ENCOUNTER — Other Ambulatory Visit
Admission: RE | Admit: 2020-12-04 | Discharge: 2020-12-04 | Disposition: A | Discharge status: Home / Self Care | Payer: BC Managed Care – PPO | Source: Ambulatory Visit | Attending: Urology | Admitting: Urology

## 2020-12-04 ENCOUNTER — Other Ambulatory Visit: Payer: Self-pay

## 2020-12-04 HISTORY — DX: Other complications of anesthesia, initial encounter: T88.59XA

## 2020-12-04 NOTE — Patient Instructions (Addendum)
Your procedure is scheduled on: 12/14/20 - Monday Report to the Registration Desk on the 1st floor of the [facility]. To find out your arrival time, please call [phone] between 1PM - 3PM on: 12/11/20 - Friday Report to [facility] on 12/11/20 at 10 am for Covid test and Labs.  REMEMBER: Instructions that are not followed completely may result in serious medical risk, up to and including death; or upon the discretion of your surgeon and anesthesiologist your surgery may need to be rescheduled.  Do not eat food or drink any fluids after midnight the night before surgery.  No gum chewing, lozengers or hard candies.  TAKE THESE MEDICATIONS THE MORNING OF SURGERY WITH A SIP OF WATER:  - azelastine (ASTELIN) 0.1 % nasal spray - fluticasone (FLONASE SENSIMIST) 27.5 MCG/SPRAY nasal spray - fludrocortisone (FLORINEF) 0.1 MG tablet - hydrocortisone (CORTEF) 10 MG tablet - Pepcid 20 mg tablet, take 1 tablet the night before surgery and 1 tablet the morning of your surgery.  One week prior to surgery:  Meloxicam (MOBIC) 15 MG tablet Stop Anti-inflammatories (NSAIDS) such as Advil, Aleve, Ibuprofen, Motrin, Naproxen, Naprosyn and Aspirin based products such as Excedrin, Goodys Powder, BC Powder.  Stop ANY OVER THE COUNTER supplements until after surgery.  No Alcohol for 24 hours before or after surgery.  No Smoking including e-cigarettes for 24 hours prior to surgery.  No chewable tobacco products for at least 6 hours prior to surgery.  No nicotine patches on the day of surgery.  Do not use any "recreational" drugs for at least a week prior to your surgery.  Please be advised that the combination of cocaine and anesthesia may have negative outcomes, up to and including death. If you test positive for cocaine, your surgery will be cancelled.  On the morning of surgery brush your teeth with toothpaste and water, you may rinse your mouth with mouthwash if you wish. Do not swallow any  toothpaste or mouthwash.  Do not wear jewelry, make-up, hairpins, clips or nail polish.  Do not wear lotions, powders, or perfumes.   Do not shave body from the neck down 48 hours prior to surgery just in case you cut yourself which could leave a site for infection.  Also, freshly shaved skin may become irritated if using the CHG soap.  Contact lenses, hearing aids and dentures may not be worn into surgery.  Do not bring valuables to the hospital. [facility] is not responsible for any missing/lost belongings or valuables.   Use CHG Soap or wipes as directed on instruction sheet.  Notify your doctor if there is any change in your medical condition (cold, fever, infection).  Wear comfortable clothing (specific to your surgery type) to the hospital.  Plan for stool softeners for home use; pain medications have a tendency to cause constipation. You can also help prevent constipation by eating foods high in fiber such as fruits and vegetables and drinking plenty of fluids as your diet allows.  After surgery, you can help prevent lung complications by doing breathing exercises.  Take deep breaths and cough every 1-2 hours. Your doctor may order a device called an Incentive Spirometer to help you take deep breaths. When coughing or sneezing, hold a pillow firmly against your incision with both hands. This is called "splinting." Doing this helps protect your incision. It also decreases belly discomfort.  If you are being admitted to the hospital overnight, leave your suitcase in the car. After surgery it may be brought  to your room.  If you are being discharged the day of surgery, you will not be allowed to drive home. You will need a responsible adult (18 years or older) to drive you home and stay with you that night.   If you are taking public transportation, you will need to have a responsible adult (18 years or older) with you. Please confirm with your physician that it is acceptable to  use public transportation.   Please call the [place] [facility] at [phone] if you have any questions about these instructions.  Surgery Visitation Policy:  Patients undergoing a surgery or procedure may have one family member or support person with them as long as that person is not COVID-19 positive or experiencing its symptoms.  That person may remain in the waiting area during the procedure.  Inpatient Visitation:    Visiting hours are 7 a.m. to 8 p.m. Inpatients will be allowed two visitors daily. The visitors may change each day during the patient's stay. No visitors under the age of 53. Any visitor under the age of 39 must be accompanied by an adult. The visitor must pass COVID-19 screenings, use hand sanitizer when entering and exiting the patient's room and wear a mask at all times, including in the patient's room. Patients must also wear a mask when staff or their visitor are in the room. Masking is required regardless of vaccination status.

## 2020-12-07 ENCOUNTER — Telehealth: Payer: Self-pay | Admitting: Pain Medicine

## 2020-12-07 ENCOUNTER — Other Ambulatory Visit: Payer: Self-pay

## 2020-12-07 MED FILL — Azelastine HCl Nasal Spray 0.1% (137 MCG/SPRAY): NASAL | 30 days supply | Qty: 30 | Fill #0 | Status: AC

## 2020-12-07 MED FILL — Levocetirizine Dihydrochloride Tab 5 MG: ORAL | 30 days supply | Qty: 30 | Fill #0 | Status: AC

## 2020-12-07 NOTE — Telephone Encounter (Signed)
According to notes, scripts were sent through 12/2020. The records indicate only the 10/2020 notes were sent. Bubble message sent to Dr. Dossie Arbour.

## 2020-12-08 ENCOUNTER — Telehealth: Payer: Self-pay

## 2020-12-08 NOTE — Telephone Encounter (Signed)
Incoming message on triage VM from patient in regards to [facility] [employer] paperwork. Patient would like to know if we received FMLA paperwork and she wanted to let whoever was doing the FMLA paperwork know that she asked for two weeks off post surgery.

## 2020-12-09 ENCOUNTER — Telehealth: Payer: Self-pay | Admitting: Pain Medicine

## 2020-12-09 NOTE — Telephone Encounter (Signed)
Patient lvmail asking to have a surgery medication letter sent to [facility] Fax [phone] DR. Hollice Espy She is having surgery on the 19th and they will not write post op meds unless they have letter from [place].

## 2020-12-09 NOTE — Telephone Encounter (Signed)
Patient notified letter sent to [facility].

## 2020-12-10 ENCOUNTER — Other Ambulatory Visit: Payer: BC Managed Care – PPO

## 2020-12-10 NOTE — Progress Notes (Signed)
Patient on schedule for PCNL/OR on 12/14/2020,called and spoke with patient on phone with pre procedure instructions given. Made aware to be here @ 0630 to SDS, then to [place] for 0800 procedure, then after recovery to OR,NPO after MN prior to procedure as well to be admitted after OR, stated understanding.

## 2020-12-11 ENCOUNTER — Other Ambulatory Visit: Payer: Self-pay

## 2020-12-11 ENCOUNTER — Encounter
Admission: RE | Admit: 2020-12-11 | Discharge: 2020-12-11 | Disposition: A | Discharge status: Home / Self Care | Payer: BC Managed Care – PPO | Source: Ambulatory Visit | Attending: Urology | Admitting: Urology

## 2020-12-11 ENCOUNTER — Other Ambulatory Visit: Payer: Self-pay | Admitting: Student

## 2020-12-11 DIAGNOSIS — M5137 Other intervertebral disc degeneration, lumbosacral region: Secondary | ICD-10-CM | POA: Diagnosis not present

## 2020-12-11 DIAGNOSIS — N200 Calculus of kidney: Secondary | ICD-10-CM | POA: Diagnosis not present

## 2020-12-11 DIAGNOSIS — E271 Primary adrenocortical insufficiency: Secondary | ICD-10-CM | POA: Diagnosis not present

## 2020-12-11 DIAGNOSIS — Z20822 Contact with and (suspected) exposure to covid-19: Secondary | ICD-10-CM | POA: Insufficient documentation

## 2020-12-11 DIAGNOSIS — B9620 Unspecified Escherichia coli [E. coli] as the cause of diseases classified elsewhere: Secondary | ICD-10-CM | POA: Diagnosis not present

## 2020-12-11 DIAGNOSIS — Z01812 Encounter for preprocedural laboratory examination: Secondary | ICD-10-CM | POA: Insufficient documentation

## 2020-12-11 DIAGNOSIS — Z9049 Acquired absence of other specified parts of digestive tract: Secondary | ICD-10-CM | POA: Diagnosis not present

## 2020-12-11 DIAGNOSIS — Z882 Allergy status to sulfonamides status: Secondary | ICD-10-CM | POA: Diagnosis not present

## 2020-12-11 DIAGNOSIS — M549 Dorsalgia, unspecified: Secondary | ICD-10-CM | POA: Diagnosis not present

## 2020-12-11 DIAGNOSIS — K5730 Diverticulosis of large intestine without perforation or abscess without bleeding: Secondary | ICD-10-CM | POA: Diagnosis not present

## 2020-12-11 DIAGNOSIS — M48061 Spinal stenosis, lumbar region without neurogenic claudication: Secondary | ICD-10-CM | POA: Diagnosis not present

## 2020-12-11 DIAGNOSIS — K219 Gastro-esophageal reflux disease without esophagitis: Secondary | ICD-10-CM | POA: Diagnosis not present

## 2020-12-11 DIAGNOSIS — Z87891 Personal history of nicotine dependence: Secondary | ICD-10-CM | POA: Diagnosis not present

## 2020-12-11 DIAGNOSIS — G894 Chronic pain syndrome: Secondary | ICD-10-CM | POA: Diagnosis not present

## 2020-12-11 DIAGNOSIS — Z888 Allergy status to other drugs, medicaments and biological substances status: Secondary | ICD-10-CM | POA: Diagnosis not present

## 2020-12-11 DIAGNOSIS — M1990 Unspecified osteoarthritis, unspecified site: Secondary | ICD-10-CM | POA: Diagnosis not present

## 2020-12-11 DIAGNOSIS — I700 Atherosclerosis of aorta: Secondary | ICD-10-CM | POA: Diagnosis not present

## 2020-12-11 LAB — PROTIME-INR
INR: 1 (ref 0.8–1.2)
Prothrombin Time: 13 seconds (ref 11.4–15.2)

## 2020-12-11 LAB — BASIC METABOLIC PANEL
Anion gap: 6 (ref 5–15)
BUN: 20 mg/dL (ref 6–20)
CO2: 27 mmol/L (ref 22–32)
Calcium: 9.1 mg/dL (ref 8.9–10.3)
Chloride: 108 mmol/L (ref 98–111)
Creatinine, Ser: 0.76 mg/dL (ref 0.44–1.00)
GFR, Estimated: >60 mL/min (ref >60)
Glucose, Bld: 82 mg/dL (ref 70–99)
Potassium: 3.6 mmol/L (ref 3.5–5.1)
Sodium: 141 mmol/L (ref 135–145)

## 2020-12-11 LAB — SARS CORONAVIRUS 2 (TAT 6-24 HRS): SARS Coronavirus 2: NEGATIVE

## 2020-12-11 LAB — CBC
HCT: 42.1 % (ref 36.0–46.0)
Hemoglobin: 14.1 g/dL (ref 12.0–15.0)
MCH: 31.4 pg (ref 26.0–34.0)
MCHC: 33.5 g/dL (ref 30.0–36.0)
MCV: 93.8 fL (ref 80.0–100.0)
Platelets: 226 10*3/uL (ref 150–400)
RBC: 4.49 MIL/uL (ref 3.87–5.11)
RDW: 12.8 % (ref 11.5–15.5)
WBC: 9.8 10*3/uL (ref 4.0–10.5)
nRBC: 0 % (ref 0.0–0.2)

## 2020-12-11 LAB — URINALYSIS, ROUTINE W REFLEX MICROSCOPIC
Bilirubin Urine: NEGATIVE
Glucose, UA: NEGATIVE mg/dL
Ketones, ur: NEGATIVE mg/dL
Nitrite: NEGATIVE
Protein, ur: NEGATIVE mg/dL
Specific Gravity, Urine: 1.013 (ref 1.005–1.030)
pH: 7 (ref 5.0–8.0)

## 2020-12-11 LAB — TYPE AND SCREEN
ABO/RH(D): O POS
Antibody Screen: NEGATIVE

## 2020-12-12 LAB — URINE CULTURE: Culture: <10000 — AB

## 2020-12-14 ENCOUNTER — Encounter
Admission: AD | Disposition: A | Discharge status: Home / Self Care | Payer: Self-pay | Source: Home / Self Care | Attending: Urology

## 2020-12-14 ENCOUNTER — Ambulatory Visit
Admission: RE | Admit: 2020-12-14 | Discharge: 2020-12-14 | Disposition: A | Discharge status: Home / Self Care | Payer: BC Managed Care – PPO | Source: Ambulatory Visit | Attending: Urology | Admitting: Urology

## 2020-12-14 ENCOUNTER — Encounter: Payer: Self-pay | Admitting: Urology

## 2020-12-14 ENCOUNTER — Ambulatory Visit: Payer: BC Managed Care – PPO | Admitting: Certified Registered Nurse Anesthetist

## 2020-12-14 ENCOUNTER — Ambulatory Visit: Payer: BC Managed Care – PPO

## 2020-12-14 ENCOUNTER — Other Ambulatory Visit: Payer: Self-pay

## 2020-12-14 ENCOUNTER — Inpatient Hospital Stay
Admission: AD | Admit: 2020-12-14 | Discharge: 2020-12-16 | DRG: 694 | Disposition: A | Discharge status: Home / Self Care | Payer: BC Managed Care – PPO | Attending: Urology | Admitting: Urology

## 2020-12-14 DIAGNOSIS — M48061 Spinal stenosis, lumbar region without neurogenic claudication: Secondary | ICD-10-CM | POA: Diagnosis present

## 2020-12-14 DIAGNOSIS — I700 Atherosclerosis of aorta: Secondary | ICD-10-CM | POA: Diagnosis present

## 2020-12-14 DIAGNOSIS — N200 Calculus of kidney: Principal | ICD-10-CM

## 2020-12-14 DIAGNOSIS — Z87891 Personal history of nicotine dependence: Secondary | ICD-10-CM

## 2020-12-14 DIAGNOSIS — E271 Primary adrenocortical insufficiency: Secondary | ICD-10-CM | POA: Diagnosis present

## 2020-12-14 DIAGNOSIS — Z9049 Acquired absence of other specified parts of digestive tract: Secondary | ICD-10-CM

## 2020-12-14 DIAGNOSIS — M549 Dorsalgia, unspecified: Secondary | ICD-10-CM | POA: Diagnosis present

## 2020-12-14 DIAGNOSIS — G894 Chronic pain syndrome: Secondary | ICD-10-CM | POA: Diagnosis present

## 2020-12-14 DIAGNOSIS — B9620 Unspecified Escherichia coli [E. coli] as the cause of diseases classified elsewhere: Secondary | ICD-10-CM | POA: Diagnosis present

## 2020-12-14 DIAGNOSIS — Z888 Allergy status to other drugs, medicaments and biological substances status: Secondary | ICD-10-CM

## 2020-12-14 DIAGNOSIS — K219 Gastro-esophageal reflux disease without esophagitis: Secondary | ICD-10-CM | POA: Diagnosis not present

## 2020-12-14 DIAGNOSIS — Z419 Encounter for procedure for purposes other than remedying health state, unspecified: Secondary | ICD-10-CM

## 2020-12-14 DIAGNOSIS — M1990 Unspecified osteoarthritis, unspecified site: Secondary | ICD-10-CM | POA: Diagnosis present

## 2020-12-14 DIAGNOSIS — K5730 Diverticulosis of large intestine without perforation or abscess without bleeding: Secondary | ICD-10-CM | POA: Diagnosis present

## 2020-12-14 DIAGNOSIS — M5137 Other intervertebral disc degeneration, lumbosacral region: Secondary | ICD-10-CM | POA: Diagnosis present

## 2020-12-14 DIAGNOSIS — Z882 Allergy status to sulfonamides status: Secondary | ICD-10-CM

## 2020-12-14 DIAGNOSIS — Z20822 Contact with and (suspected) exposure to covid-19: Secondary | ICD-10-CM | POA: Diagnosis present

## 2020-12-14 HISTORY — PX: IR NEPHROSTOMY PLACEMENT LEFT: IMG6063

## 2020-12-14 HISTORY — PX: NEPHROLITHOTOMY: SHX5134

## 2020-12-14 LAB — CBC
HCT: 40.5 % (ref 36.0–46.0)
Hemoglobin: 13.9 g/dL (ref 12.0–15.0)
MCH: 31.9 pg (ref 26.0–34.0)
MCHC: 34.3 g/dL (ref 30.0–36.0)
MCV: 92.9 fL (ref 80.0–100.0)
Platelets: 223 10*3/uL (ref 150–400)
RBC: 4.36 MIL/uL (ref 3.87–5.11)
RDW: 12.7 % (ref 11.5–15.5)
WBC: 10.1 10*3/uL (ref 4.0–10.5)
nRBC: 0 % (ref 0.0–0.2)

## 2020-12-14 LAB — ABO/RH: ABO/RH(D): O POS

## 2020-12-14 LAB — PROTIME-INR
INR: 1 (ref 0.8–1.2)
Prothrombin Time: 12.7 seconds (ref 11.4–15.2)

## 2020-12-14 SURGERY — NEPHROLITHOTOMY PERCUTANEOUS
Anesthesia: General | Laterality: Left

## 2020-12-14 MED ORDER — ACETAMINOPHEN 10 MG/ML IV SOLN
INTRAVENOUS | Status: AC
  Filled 2020-12-14: qty 100

## 2020-12-14 MED ORDER — DEXMEDETOMIDINE (PRECEDEX) IN NS 20 MCG/5ML (4 MCG/ML) IV SYRINGE
PREFILLED_SYRINGE | INTRAVENOUS | Status: AC
  Filled 2020-12-14: qty 5

## 2020-12-14 MED ORDER — DIPHENHYDRAMINE HCL 50 MG/ML IJ SOLN: 12.5000 mg | Freq: Four times a day (QID) | INTRAMUSCULAR | Status: DC | PRN

## 2020-12-14 MED ORDER — ORAL CARE MOUTH RINSE: 15.0000 mL | Freq: Once | OROMUCOSAL | Status: AC

## 2020-12-14 MED ORDER — FENTANYL CITRATE (PF) 100 MCG/2ML IJ SOLN
25.0000 ug | INTRAMUSCULAR | Status: DC | PRN
  Administered 2020-12-14 (×3): 25 ug via INTRAVENOUS

## 2020-12-14 MED ORDER — MORPHINE SULFATE (PF) 2 MG/ML IV SOLN
2.0000 mg | INTRAVENOUS | Status: DC | PRN
  Administered 2020-12-14 – 2020-12-15 (×3): 2 mg via INTRAVENOUS
  Administered 2020-12-15 (×4): 4 mg via INTRAVENOUS
  Filled 2020-12-14: qty 2
  Filled 2020-12-14 (×2): qty 1
  Filled 2020-12-14 (×2): qty 2
  Filled 2020-12-14: qty 1
  Filled 2020-12-14: qty 2

## 2020-12-14 MED ORDER — FENTANYL CITRATE (PF) 100 MCG/2ML IJ SOLN
INTRAMUSCULAR | Status: AC
  Filled 2020-12-14: qty 2

## 2020-12-14 MED ORDER — CEFAZOLIN SODIUM-DEXTROSE 2-4 GM/100ML-% IV SOLN
2.0000 g | INTRAVENOUS | Status: AC
  Administered 2020-12-14: 2 g via INTRAVENOUS

## 2020-12-14 MED ORDER — FENTANYL CITRATE (PF) 100 MCG/2ML IJ SOLN
INTRAMUSCULAR | Status: DC | PRN
  Administered 2020-12-14 (×4): 50 ug via INTRAVENOUS

## 2020-12-14 MED ORDER — CHLORHEXIDINE GLUCONATE 0.12 % MT SOLN
OROMUCOSAL | Status: AC
  Filled 2020-12-14: qty 15

## 2020-12-14 MED ORDER — SODIUM CHLORIDE 0.9 % IV SOLN
1.0000 g | Freq: Three times a day (TID) | INTRAVENOUS | Status: AC
  Administered 2020-12-14 – 2020-12-15 (×2): 1 g via INTRAVENOUS
  Filled 2020-12-14 (×2): qty 1

## 2020-12-14 MED ORDER — SODIUM CHLORIDE 0.9 % IV SOLN: INTRAVENOUS | Status: DC

## 2020-12-14 MED ORDER — HYDROCORTISONE 5 MG PO TABS
15.0000 mg | ORAL_TABLET | Freq: Every morning | ORAL | Status: DC
  Administered 2020-12-15 – 2020-12-16 (×2): 15 mg via ORAL
  Filled 2020-12-14 (×2): qty 1

## 2020-12-14 MED ORDER — MIDAZOLAM HCL 2 MG/2ML IJ SOLN
INTRAMUSCULAR | Status: AC
  Filled 2020-12-14: qty 2

## 2020-12-14 MED ORDER — MIDAZOLAM HCL 5 MG/5ML IJ SOLN
INTRAMUSCULAR | Status: AC | PRN
  Administered 2020-12-14 (×4): 1 mg via INTRAVENOUS

## 2020-12-14 MED ORDER — TIZANIDINE HCL 4 MG PO TABS
4.0000 mg | ORAL_TABLET | Freq: Three times a day (TID) | ORAL | Status: DC | PRN
  Filled 2020-12-14: qty 1

## 2020-12-14 MED ORDER — FENTANYL CITRATE (PF) 100 MCG/2ML IJ SOLN
INTRAMUSCULAR | Status: AC
  Administered 2020-12-14: 25 ug via INTRAVENOUS
  Filled 2020-12-14: qty 2

## 2020-12-14 MED ORDER — ROCURONIUM BROMIDE 100 MG/10ML IV SOLN
INTRAVENOUS | Status: DC | PRN
  Administered 2020-12-14: 60 mg via INTRAVENOUS
  Administered 2020-12-14: 20 mg via INTRAVENOUS
  Administered 2020-12-14: 10 mg via INTRAVENOUS

## 2020-12-14 MED ORDER — SEVOFLURANE IN SOLN
RESPIRATORY_TRACT | Status: AC
  Filled 2020-12-14: qty 250

## 2020-12-14 MED ORDER — CHLORHEXIDINE GLUCONATE CLOTH 2 % EX PADS: 6.0000 | MEDICATED_PAD | Freq: Every day | CUTANEOUS | Status: DC

## 2020-12-14 MED ORDER — FENTANYL CITRATE (PF) 100 MCG/2ML IJ SOLN
INTRAMUSCULAR | Status: AC | PRN
  Administered 2020-12-14 (×2): 50 ug via INTRAVENOUS
  Administered 2020-12-14 (×3): 25 ug via INTRAVENOUS
  Administered 2020-12-14: 50 ug via INTRAVENOUS

## 2020-12-14 MED ORDER — FLUTICASONE PROPIONATE 50 MCG/ACT NA SUSP
1.0000 | Freq: Every day | NASAL | Status: DC
  Administered 2020-12-15 – 2020-12-16 (×2): 1 via NASAL
  Filled 2020-12-14: qty 16

## 2020-12-14 MED ORDER — DEXMEDETOMIDINE (PRECEDEX) IN NS 20 MCG/5ML (4 MCG/ML) IV SYRINGE
PREFILLED_SYRINGE | INTRAVENOUS | Status: DC | PRN
  Administered 2020-12-14 (×3): 4 ug via INTRAVENOUS

## 2020-12-14 MED ORDER — HEPARIN SODIUM (PORCINE) 5000 UNIT/ML IJ SOLN
5000.0000 [IU] | Freq: Three times a day (TID) | INTRAMUSCULAR | Status: DC
  Administered 2020-12-14 – 2020-12-16 (×5): 5000 [IU] via SUBCUTANEOUS
  Filled 2020-12-14 (×5): qty 1

## 2020-12-14 MED ORDER — ACETAMINOPHEN 10 MG/ML IV SOLN
INTRAVENOUS | Status: DC | PRN
  Administered 2020-12-14: 1000 mg via INTRAVENOUS

## 2020-12-14 MED ORDER — IODIXANOL 320 MG/ML IV SOLN
50.0000 mL | Freq: Once | INTRAVENOUS | Status: AC | PRN
  Administered 2020-12-14: 10 mL

## 2020-12-14 MED ORDER — HYDROCORTISONE 5 MG PO TABS: 5.0000 mg | ORAL_TABLET | ORAL | Status: DC

## 2020-12-14 MED ORDER — ONDANSETRON HCL 4 MG/2ML IJ SOLN: 4.0000 mg | Freq: Once | INTRAMUSCULAR | Status: DC | PRN

## 2020-12-14 MED ORDER — DOCUSATE SODIUM 100 MG PO CAPS
100.0000 mg | ORAL_CAPSULE | Freq: Two times a day (BID) | ORAL | Status: DC
  Administered 2020-12-14 – 2020-12-16 (×4): 100 mg via ORAL
  Filled 2020-12-14 (×4): qty 1

## 2020-12-14 MED ORDER — CHLORHEXIDINE GLUCONATE 0.12 % MT SOLN
15.0000 mL | Freq: Once | OROMUCOSAL | Status: AC
  Administered 2020-12-14: 15 mL via OROMUCOSAL

## 2020-12-14 MED ORDER — DEXAMETHASONE SODIUM PHOSPHATE 10 MG/ML IJ SOLN
INTRAMUSCULAR | Status: DC | PRN
  Administered 2020-12-14: 10 mg via INTRAVENOUS

## 2020-12-14 MED ORDER — SODIUM CHLORIDE 0.9 % IV SOLN
2.0000 g | INTRAVENOUS | Status: DC
  Administered 2020-12-14: 2 g via INTRAVENOUS
  Filled 2020-12-14: qty 20
  Filled 2020-12-14: qty 2

## 2020-12-14 MED ORDER — FAMOTIDINE 20 MG PO TABS
ORAL_TABLET | ORAL | Status: AC
  Filled 2020-12-14: qty 1

## 2020-12-14 MED ORDER — LEVOCETIRIZINE DIHYDROCHLORIDE 5 MG PO TABS: 5.0000 mg | ORAL_TABLET | Freq: Every evening | ORAL | Status: DC

## 2020-12-14 MED ORDER — BELLADONNA ALKALOIDS-OPIUM 16.2-60 MG RE SUPP
RECTAL | Status: AC
  Administered 2020-12-14: 1 via RECTAL
  Filled 2020-12-14: qty 1

## 2020-12-14 MED ORDER — LORATADINE 10 MG PO TABS
10.0000 mg | ORAL_TABLET | Freq: Every evening | ORAL | Status: DC
  Administered 2020-12-14 – 2020-12-15 (×2): 10 mg via ORAL
  Filled 2020-12-14 (×2): qty 1

## 2020-12-14 MED ORDER — LACTATED RINGERS IV SOLN: INTRAVENOUS | Status: DC

## 2020-12-14 MED ORDER — CEFAZOLIN SODIUM-DEXTROSE 2-4 GM/100ML-% IV SOLN
INTRAVENOUS | Status: AC
  Filled 2020-12-14: qty 100

## 2020-12-14 MED ORDER — SUGAMMADEX SODIUM 200 MG/2ML IV SOLN
INTRAVENOUS | Status: DC | PRN
  Administered 2020-12-14: 200 mg via INTRAVENOUS

## 2020-12-14 MED ORDER — MIDAZOLAM HCL 5 MG/5ML IJ SOLN
INTRAMUSCULAR | Status: AC
  Filled 2020-12-14: qty 5

## 2020-12-14 MED ORDER — BELLADONNA ALKALOIDS-OPIUM 16.2-60 MG RE SUPP: 1.0000 | Freq: Four times a day (QID) | RECTAL | Status: DC | PRN

## 2020-12-14 MED ORDER — ONDANSETRON HCL 4 MG/2ML IJ SOLN
INTRAMUSCULAR | Status: DC | PRN
  Administered 2020-12-14: 4 mg via INTRAVENOUS

## 2020-12-14 MED ORDER — FLUDROCORTISONE ACETATE 0.1 MG PO TABS
0.0500 mg | ORAL_TABLET | Freq: Every day | ORAL | Status: DC
  Administered 2020-12-15 – 2020-12-16 (×2): 0.05 mg via ORAL
  Filled 2020-12-14 (×2): qty 0.5

## 2020-12-14 MED ORDER — DIPHENHYDRAMINE HCL 12.5 MG/5ML PO ELIX
12.5000 mg | ORAL_SOLUTION | Freq: Four times a day (QID) | ORAL | Status: DC | PRN
  Filled 2020-12-14: qty 5

## 2020-12-14 MED ORDER — HYDROCORTISONE 5 MG PO TABS
5.0000 mg | ORAL_TABLET | Freq: Every evening | ORAL | Status: DC
  Administered 2020-12-14 – 2020-12-15 (×2): 5 mg via ORAL
  Filled 2020-12-14 (×4): qty 1

## 2020-12-14 MED ORDER — LIDOCAINE HCL (CARDIAC) PF 100 MG/5ML IV SOSY
PREFILLED_SYRINGE | INTRAVENOUS | Status: DC | PRN
  Administered 2020-12-14: 100 mg via INTRAVENOUS

## 2020-12-14 MED ORDER — IOHEXOL 180 MG/ML  SOLN
INTRAMUSCULAR | Status: DC | PRN
  Administered 2020-12-14: 40 mL

## 2020-12-14 MED ORDER — OXYCODONE-ACETAMINOPHEN 5-325 MG PO TABS
1.0000 | ORAL_TABLET | ORAL | Status: DC | PRN
  Administered 2020-12-14 – 2020-12-16 (×10): 2 via ORAL
  Filled 2020-12-14 (×10): qty 2

## 2020-12-14 MED ORDER — AZELASTINE HCL 0.1 % NA SOLN
1.0000 | Freq: Two times a day (BID) | NASAL | Status: DC
  Administered 2020-12-15 – 2020-12-16 (×2): 1 via NASAL
  Filled 2020-12-14: qty 30

## 2020-12-14 MED ORDER — PROPOFOL 10 MG/ML IV BOLUS
INTRAVENOUS | Status: DC | PRN
  Administered 2020-12-14: 200 mg via INTRAVENOUS

## 2020-12-14 MED ORDER — HYDROCORTISONE NA SUCCINATE PF 100 MG IJ SOLR
INTRAMUSCULAR | Status: DC | PRN
  Administered 2020-12-14: 100 mg via INTRAVENOUS

## 2020-12-14 MED ORDER — ACETAMINOPHEN 325 MG PO TABS: 650.0000 mg | ORAL_TABLET | ORAL | Status: DC | PRN

## 2020-12-14 MED ORDER — ONDANSETRON HCL 4 MG/2ML IJ SOLN: 4.0000 mg | INTRAMUSCULAR | Status: DC | PRN

## 2020-12-14 MED ORDER — OXYBUTYNIN CHLORIDE 5 MG PO TABS
5.0000 mg | ORAL_TABLET | Freq: Three times a day (TID) | ORAL | Status: DC | PRN
  Administered 2020-12-14: 5 mg via ORAL
  Filled 2020-12-14: qty 1

## 2020-12-14 SURGICAL SUPPLY — 64 items
ADAPTER IRRIG TUBE 2 SPIKE SOL (ADAPTER) ×4 IMPLANT
ADAPTER SCOPE UROLOK II (MISCELLANEOUS) ×2 IMPLANT
BAG URINE DRAIN 2000ML AR STRL (UROLOGICAL SUPPLIES) ×2 IMPLANT
BALLN NEPHROSTOMY 10X15 (UROLOGICAL SUPPLIES) ×2 IMPLANT
BASKET ZERO TIP 1.9FR (BASKET) ×2 IMPLANT
BLADE SURG 15 STRL LF DISP TIS (BLADE) ×1 IMPLANT
BLADE SURG 15 STRL SS (BLADE) ×1
CATH COUNCIL 22FR (CATHETERS) IMPLANT
CATH FOLEY 2W COUNCIL 20FR 5CC (CATHETERS) IMPLANT
CATH FOLEY 2W COUNCIL 5CC 18FR (CATHETERS) ×2 IMPLANT
CATH STENT KAYE NEPHR TAMP (CATHETERS) IMPLANT
CATH URET FLEX-TIP 2 LUMEN 10F (CATHETERS) IMPLANT
CATH URETL 5X70 OPEN END (CATHETERS) ×2 IMPLANT
CATH/STENT KAYE NEPHR TAMP (CATHETERS)
CHLORAPREP W/TINT 26 (MISCELLANEOUS) ×2 IMPLANT
CNTNR SPEC 2.5X3XGRAD LEK (MISCELLANEOUS) ×1
CONT SPEC 4OZ STER OR WHT (MISCELLANEOUS) ×1
CONTAINER SPEC 2.5X3XGRAD LEK (MISCELLANEOUS) ×1 IMPLANT
COVER LIGHT HANDLE STERIS (MISCELLANEOUS) ×2 IMPLANT
COVER WAND RF STERILE (DRAPES) ×2 IMPLANT
DRAPE 3/4 80X56 (DRAPES) ×2 IMPLANT
DRAPE C ARM PK CFD 31 SPINE (DRAPES) IMPLANT
DRAPE C-ARM XRAY 36X54 (DRAPES) ×2 IMPLANT
DRAPE SURG 17X11 SM STRL (DRAPES) ×8 IMPLANT
GAUZE SPONGE 4X4 12PLY STRL (GAUZE/BANDAGES/DRESSINGS) ×2 IMPLANT
GLIDEWIRE STIFF .35X180X3 HYDR (WIRE) IMPLANT
GLOVE SURG ENC MOIS LTX SZ6.5 (GLOVE) ×4 IMPLANT
GLOVE SURG UNDER POLY LF SZ6.5 (GLOVE) ×4 IMPLANT
GOWN STRL REUS W/ TWL LRG LVL3 (GOWN DISPOSABLE) ×2 IMPLANT
GOWN STRL REUS W/TWL LRG LVL3 (GOWN DISPOSABLE) ×2
GUIDEWIRE GREEN .038 145CM (MISCELLANEOUS) ×2 IMPLANT
GUIDEWIRE INTRO SET STRAIGHT (WIRE) ×2 IMPLANT
GUIDEWIRE STR DUAL SENSOR (WIRE) ×2 IMPLANT
GUIDEWIRE STR ZIPWIRE 035X150 (MISCELLANEOUS) IMPLANT
GUIDEWIRE SUPER STIFF (WIRE) ×2 IMPLANT
HOLDER FOLEY CATH W/STRAP (MISCELLANEOUS) ×2 IMPLANT
IV NS IRRIG 3000ML ARTHROMATIC (IV SOLUTION) ×8 IMPLANT
KIT PROBE TRILOGY 3.9X350 (MISCELLANEOUS) ×2 IMPLANT
MANIFOLD NEPTUNE II (INSTRUMENTS) ×4 IMPLANT
MAT ABSORB  FLUID 56X50 GRAY (MISCELLANEOUS) ×2
MAT ABSORB FLUID 56X50 GRAY (MISCELLANEOUS) ×2 IMPLANT
NDL FASCIA INCISION 18GA (NEEDLE) ×2 IMPLANT
PACK BASIN MINOR ARMC (MISCELLANEOUS) ×2 IMPLANT
PAD ABD DERMACEA PRESS 5X9 (GAUZE/BANDAGES/DRESSINGS) ×4 IMPLANT
PAD PREP 24X41 OB/GYN DISP (PERSONAL CARE ITEMS) ×2 IMPLANT
SET IRRIGATING DISP (SET/KITS/TRAYS/PACK) ×2 IMPLANT
SHEET NEURO XL SOL CTL (MISCELLANEOUS) ×2 IMPLANT
SPONGE DRAIN TRACH 4X4 STRL 2S (GAUZE/BANDAGES/DRESSINGS) ×2 IMPLANT
STENT URET 6FRX24 CONTOUR (STENTS) IMPLANT
STENT URET 6FRX26 CONTOUR (STENTS) IMPLANT
STRAP SAFETY 5IN WIDE (MISCELLANEOUS) ×4 IMPLANT
SURGILUBE 2OZ TUBE FLIPTOP (MISCELLANEOUS) ×2 IMPLANT
SUT SILK 0 SH 30 (SUTURE) ×2 IMPLANT
SYR 10ML LL (SYRINGE) ×2 IMPLANT
SYR 20ML LL LF (SYRINGE) ×2 IMPLANT
SYR 30ML LL (SYRINGE) ×2 IMPLANT
SYR TOOMEY IRRIG 70ML (MISCELLANEOUS) ×2
SYRINGE TOOMEY IRRIG 70ML (MISCELLANEOUS) ×1 IMPLANT
TAPE CLOTH 3X10 WHT NS LF (GAUZE/BANDAGES/DRESSINGS) ×2 IMPLANT
TAPE MICROFOAM 4IN (TAPE) ×2 IMPLANT
TRACTIP FLEXIVA PULSE ID 200 (Laser) ×2 IMPLANT
TRAY FOLEY MTR SLVR 16FR STAT (SET/KITS/TRAYS/PACK) ×2 IMPLANT
TUBING CONNECTING 10 (TUBING) ×2 IMPLANT
WATER STERILE IRR 1000ML POUR (IV SOLUTION) ×2 IMPLANT

## 2020-12-14 NOTE — Op Note (Signed)
Date of procedure: 12/14/20  Preoperative diagnosis:  1. Left partial staghorn calculus, 3 cm  Postoperative diagnosis:  1. Same as above  Procedure: 1. Left PCNL 2. Left antegrade nephrostogram 3. Left PCN placement 4. Laser lithotripsy  5. Basket extraction of stone fragment 6. Interpretation of fluoroscopy less than 30 minutes  Surgeon: Hollice Espy, MD  Anesthesia: General  Complications: None  Intraoperative findings: Dual access provided by interventional radiology, lateral inferior of the to access utilized.  All stone burden treated today.  No complications.  EBL: Minimal  Specimens: Stone fragment  Drains: 18 [place] council tip Foley catheter as nephrostomy tube, 5 French nephroureteral catheter, 16 French Foley catheter per urethra  Indication: Denise Burns is a 50 y.o. patient with 3 cm left partial staghorn involving compound upper pole calyces.  After reviewing the management options for treatment, she elected to proceed with the above surgical procedure(s). We have discussed the potential benefits and risks of the procedure, side effects of the proposed treatment, the likelihood of the patient achieving the goals of the procedure, and any potential problems that might occur during the procedure or recuperation. Informed consent has been obtained.    Prior to the procedure, case was discussed with Dr. Laurence Ferrari.  Access for today's procedure was provided in interventional radiology and the patient was brought upstairs to the operating room.  Description of procedure:  The patient was taken to the operating room and general anesthesia was induced on the stretcher.  A 16 French Foley catheter was then placed sterilely.  Patient was then repositioned in the prone position on the operating room table on tomorrow pressure points were padded.  She was secured to the table.  She was then prepped and draped in the standard sterile fashion.  Scout imaging revealed  to nephroureteral catheters in place providing dual access into the upper pole calyces.  1 entered the extreme upper pole and was somewhat medial the other one was more lateral entering the inferior most calyx within the upper pole, and calyx.  This was below the rib and seemed like the most favorable approach.  Both of the catheters traversed down into the ureter.  I cannulated the lower of the 2 accesses using a Super Stiff wire down to the level of the bladder.  I then remove the 5 French nephroureteral catheter leaving the Super Stiff wire in place.  I then made incision transversely approximately 1.5 cm in length.  I incised the fascia in a cruciate incision over the wire with a fascial incisor needle.  I then used a access sheath to introduce a second sensor wire down to the level of the kidney.  I excluded the sensor wire and curled in the bag as a safety wire.  I then used a NephroMax balloon which I advanced to the level of the stone and dilated to 8 cm of water and let it stay on there for several minutes for hemostasis.  I advanced the sheath over the balloon to the level of the stone.  Upon deflating the balloon and removing it, I was able to advance the nephroscope into the collecting system right to the level of the stone.  I used the trilogy lithotripter device both with the pneumatic as well as ultrasound to fragment the stone.  I was able to clear that entire calyx and move into the superiormost calyx of the stone where the other nephroureteral stent was seen coming through the calyx itself.  Unfortunately, I was  not able to maneuver into the middle of the compound calyx from this access using the nephroscope.  I exchanged for a flexible cystoscope and using a 242 m laser fiber, I was able to fragment that stone into 4 5 pieces.  This was somewhat difficult of an angle.  Ultimately, use a 1.9 French tipless nitinol basket to remove each of these pieces from the calyx.  At this point in time,  imaging revealed no additional stone material.  I also did not appreciate any additional stone material with the flexible cystoscope although visualization was becoming somewhat poor at this point.  Overall, I felt that I had cleared the entirety of the collecting system.  Finally, the scope was removed.  A 18 French council tip catheter was advanced over the wire into the collecting system the balloon was filled with 2 cc of diluted contrast.  This appeared to be in good position.  I then removed the sensor wire and an antegrade nephrostogram was performed.  This revealed initially no contrast extravasation, confirmed the presence of the previous nephroureteral catheter as well as good position of the nephrostomy tube within the collecting system in the upper pole calyx.  The tip was seen in the infundibulum.  There was some contrast extravasation along the tract but this was expected.  Contrast did traverse down the ureter and the anatomy looked appropriate.  I finally removed the excluded safety wire as well as sheath.  I secured the nephrostomy Foley to the patient's skin using silk ties x2.  This was left open to gravity.  I then also secured the upper pole nephroureteral catheter which remained on manipulated throughout the entirety of the procedure.  The site was cleaned and dried and then dressed with 4 x 4's ABD pads and foam tape.  She was then positioned again supine on the stretcher.  She was then reversed of anesthesia and taken the PACU in stable condition.  Plan: She will be admitted overnight.  We will remove her Foley in the morning as well as clamp her nephrostomy tube.  If she tolerates clamp trial, will likely remove all of her nephrostomy tube/nephroureteral catheter prior to discharge.   Hollice Espy, M.D.

## 2020-12-14 NOTE — Consult Note (Signed)
Chief Complaint: Left sided staghorn calculi. Request is for left sided nephrostomy tube placement for nephrolithotomy.  Referring Physician(s): Hollice Espy  Supervising Physician: Jacqulynn Cadet  Patient Status: [facility] - Out-pt  History of Present Illness: Denise Burns is a 50 y.o. female 50 y.o. female outpatient. History of Addison's disease, GERD,  bilateral nephrolithiasis with left sided partial stag horn calculi. Presented to the ED at [facility] with LLQ abdominal pain and left sided flank pain. Found to have pyelonephritis. CT renal from 3.8.22 reads Nonobstructive staghorn type LEFT upper pole renal calculus which measures 3.0 cm, with perinephric and proximal periureteric stranding on the LEFT. Findings could be secondary to a recently passed stone or urinary tract infection. Patient is scheduled for a left sided nephrolithotomy with Dr. Erlene Quan on 4.16.22 Team is requesting a nephrostomy tube placement - Left sided for surgical purposes.   Currently without any significant complaints. Patient alert and laying in bed, calm and comfortable. Denies any fevers, headache, chest pain, SOB, cough, abdominal pain, nausea, vomiting or bleeding.  Return precautions and treatment recommendations and follow-up discussed with the patient who is agreeable with the plan.    Past Medical History:  Diagnosis Date  . Addison disease ([place])   . Arthritis   . Back pain   . Complication of anesthesia    nausea  . GERD (gastroesophageal reflux disease)     Past Surgical History:  Procedure Laterality Date  . CHOLECYSTECTOMY    . dental work    . DILATION AND CURETTAGE OF UTERUS    . POLYPECTOMY      Allergies: Lisinopril, Tramadol, and Sulfa antibiotics  Medications: Prior to Admission medications   Medication Sig Start Date End Date Taking? Authorizing Provider  azelastine (ASTELIN) 0.1 % nasal spray Place 1 spray into both nostrils 2 (two) times daily.    [provider]  azelastine (ASTELIN) 0.1 % nasal spray USE 1-2 PUFFS IN EACH NOSTRIL AS NEEDED TWICE A DAY 30 DAY(S) 08/25/20 08/25/21  Harold Hedge, Darrick Grinder, MD  cephALEXin (KEFLEX) 500 MG capsule Take 1 capsule (500 mg total) by mouth 3 (three) times daily. 11/30/20   [name], NP  fludrocortisone (FLORINEF) 0.1 MG tablet TAKE 1/2 TABLET BY MOUTH DAILY Patient taking differently: Take 0.05 mg by mouth daily. 09/23/20 09/23/21  Shamleffer, Melanie Crazier, MD  fluticasone (FLONASE SENSIMIST) 27.5 MCG/SPRAY nasal spray Place 1 spray into the nose in the morning and at bedtime.    [provider]  HYDROcodone-acetaminophen (NORCO) 7.5-325 MG tablet Take 1 tablet by mouth every 8 (eight) hours as needed for severe pain. Must last 30 days. 11/06/20 12/06/20  Milinda Pointer, MD  HYDROcodone-acetaminophen (NORCO) 7.5-325 MG tablet Take 1 tablet by mouth every 8 (eight) hours as needed for severe pain. Must last 30 days. 12/07/20 01/06/21  Milinda Pointer, MD  HYDROcodone-acetaminophen (NORCO) 7.5-325 MG tablet Take 1 tablet by mouth every 8 (eight) hours as needed for severe pain. Must last 30 days. 01/06/21 02/05/21  Milinda Pointer, MD  hydrocortisone (CORTEF) 10 MG tablet Take 1 and 1/2 tablet by mouth daily with breakfast and 1/2 tablet daily in the evening Patient taking differently: Take 5-15 mg by mouth See admin instructions. 15 mg in the morning, 5 mg in the evening 02/24/20   Shamleffer, Melanie Crazier, MD  levocetirizine (XYZAL) 5 MG tablet Take 5 mg by mouth every evening.    [provider]  levocetirizine (XYZAL) 5 MG tablet TAKE 1 TABLET BY MOUTH ONCE  DAILY IN THE EVENING Patient not taking: Reported on 11/02/2020 08/25/20 08/25/21  Harold Hedge, Darrick Grinder, MD  meloxicam (MOBIC) 15 MG tablet TAKE 1 TABLET BY MOUTH DAILY 11/18/20   [name], NP  promethazine (PHENERGAN) 25 MG tablet Take 1 tablet (25 mg total) by mouth every 8 (eight) hours as needed for nausea or vomiting.  11/30/20   [name], NP  pseudoephedrine (SUDAFED) 30 MG tablet Take 30 mg by mouth every 12 (twelve) hours as needed for congestion.     [provider]  tiZANidine (ZANAFLEX) 4 MG tablet TAKE 1 TABLET BY MOUTH EVERY 8 HOURS AS NEEDED FOR MUSCLE SPASMS. Patient taking differently: Take 4 mg by mouth every 8 (eight) hours as needed for muscle spasms. 10/22/20 10/22/21  [name], NP     Family History  Problem Relation Age of Onset  . Arthritis Mother   . Hyperlipidemia Mother   . Hypertension Mother   . High blood pressure Mother   . Alcohol abuse Father   . Hearing loss Father   . Hyperlipidemia Father   . High blood pressure Father     Social History   Socioeconomic History  . Marital status: Married    Spouse name: Legrand Como  . Number of children: Not on file  . Years of education: Not on file  . Highest education level: Not on file  Occupational History  . Not on file  Tobacco Use  . Smoking status: Former Smoker    Quit date: 08/01/1994    Years since quitting: 26.3  . Smokeless tobacco: Never Used  Vaping Use  . Vaping Use: Never used  Substance and Sexual Activity  . Alcohol use: Not Currently  . Drug use: Never  . Sexual activity: Yes    Partners: Male    Comment: menopause  Other Topics Concern  . Not on file  Social History Narrative  . Not on file   Social Determinants of Health   Financial Resource Strain: Not on file  Food Insecurity: Not on file  Transportation Needs: Not on file  Physical Activity: Not on file  Stress: Not on file  Social Connections: Not on file     Review of Systems: A 12 point ROS discussed and pertinent positives are indicated in the HPI above.  All other systems are negative.  Review of Systems  Constitutional: Negative for fatigue and fever.  HENT: Negative for congestion.   Respiratory: Negative for cough and shortness of breath.   Gastrointestinal: Negative for abdominal pain, diarrhea, nausea  and vomiting.    Vital Signs: BP 128/83   Pulse 78   Temp 98.4 F (36.9 C) (Oral)   Resp 16   Ht 5\' 4"  (1.626 m)   Wt 185 lb (83.9 kg)   LMP 12/01/2020 (Approximate)   SpO2 98%   BMI 31.76 kg/m   Physical Exam Vitals and nursing note reviewed.  Constitutional:      Appearance: She is well-developed.  HENT:     Head: Normocephalic and atraumatic.  Eyes:     Conjunctiva/sclera: Conjunctivae normal.  Cardiovascular:     Rate and Rhythm: Normal rate and regular rhythm.     Heart sounds: Normal heart sounds.  Pulmonary:     Effort: Pulmonary effort is normal.     Breath sounds: Normal breath sounds.  Musculoskeletal:        General: Normal range of motion.     Cervical back: Normal range of motion.  Skin:    General: Skin is warm.  Neurological:     Mental Status: She is alert and oriented to person, place, and time.     Imaging: No results found.  Labs:  CBC: Recent Labs    10/06/20 0938 10/07/20 0535 10/22/20 0000 12/11/20 1003  WBC 11.5* 7.3 11.5* 9.8  HGB 13.7 11.4* 14.0 14.1  HCT 40.8 34.9* 41.2 42.1  PLT 139* 114* 287 226    COAGS: Recent Labs    12/11/20 1003 12/14/20 0723  INR 1.0 1.0    BMP: Recent Labs    08/13/20 0924 10/06/20 0938 10/07/20 0535 12/11/20 1003  NA 141 141 140 141  K 3.6 3.2* 3.5 3.6  CL 105 109 110 108  CO2 27 24 26 27   GLUCOSE 97 102* 139* 82  BUN 22 25* 11 20  CALCIUM 9.5 9.1 8.1* 9.1  CREATININE 0.76 1.03* 0.69 0.76  GFRNONAA  --  >60 >60 >60    LIVER FUNCTION TESTS: Recent Labs    10/06/20 0938  BILITOT 1.9*  AST 38  ALT 34  ALKPHOS 68  PROT 6.8  ALBUMIN 3.8    Assessment and Plan:  50 y.o. female outpatient. History of Addison's disease, GERD,  bilateral nephrolithiasis with left sided partial stag horn calculi. Presented to the ED at [facility] with LLQ abdominal pain and left sided flank pain. Found to have pyelonephritis. CT renal from 3.8.22 reads Nonobstructive staghorn type LEFT upper pole  renal calculus which measures 3.0 cm, with perinephric and proximal periureteric stranding on the LEFT. Findings could be secondary to a recently passed stone or urinary tract infection. Patient is scheduled for a left sided nephrolithotomy with Dr. Erlene Quan on 4.16.22 Team is requesting a nephrostomy tube placement - Left sided for surgical purposes.    All labs and medications are within acceptable parameter. Allergies include sulfa and tramadol.  Patient has been NPO since midnight.  Risks and benefits of left sided  PCN placement was discussed with the patient including, but not limited to, infection, bleeding, significant bleeding causing loss or decrease in renal function or damage to adjacent structures.   All of the patient's questions were answered, patient is agreeable to proceed.  Consent signed and in chart.  Thank you for this interesting consult.  I greatly enjoyed meeting Laycie Schriner and look forward to participating in their care.  A copy of this report was sent to the requesting provider on this date.  Electronically Signed: Jacqualine Mau, NP 12/14/2020, 8:19 AM   I spent a total of  30 Minutes   in face to face in clinical consultation, greater than 50% of which was counseling/coordinating care for left sided nephrostomy tube

## 2020-12-14 NOTE — Progress Notes (Signed)
Informed pt's husband pt will be admitted to room 208

## 2020-12-14 NOTE — H&P (Signed)
H&P updated 12/14/20 No changes RRR CTAB Preop UCx negative  Candus Braud 07-Jun-1971 [id number]  Referring provider: [name], NP [place],  [place] [address]     Chief Complaint  Patient presents with  . Nephrolithiasis    HPI: 50 year old female with bilateral nephrolithiasis who presents today to discuss management options for a left partial staghorn calculus.  Notably, she was recently admitted with pyelonephritis in March to the hospitalist service.  As part of her work-up, she underwent a noncontrast CT scan which indicated approximately 1.1 cm right lower pole nonobstructing stone as well as a large 3 cm left partial staghorn calculus involving the upper pole moiety with some perinephric stranding without obstruction.  Urine culture during this admission grew pansensitive E. Coli.  Today, she denies any ongoing flank pain, no fevers, dysuria, or chills.  She is a Marine scientist.  She is very educated about this procedure and has been reading in anticipation of her conversation today.  No prior history of stone disease.  She does have a personal history of Addison's disease and is required stress dose steroids intraoperatively TRUS.   PMH:     Past Medical History:  Diagnosis Date  . Addison disease ([place])   . Arthritis   . Back pain   . GERD (gastroesophageal reflux disease)     Surgical History:      Past Surgical History:  Procedure Laterality Date  . CHOLECYSTECTOMY    . POLYPECTOMY      Home Medications:       Allergies as of 11/10/2020      Reactions   Lisinopril Cough   Tramadol Other (See Comments)   Other reaction(s): muscle tension   Sulfa Antibiotics Other (See Comments)         Medication List       Accurate as of November 10, 2020 11:59 PM. If you have any questions, ask your nurse or doctor.        azelastine 0.1 % nasal spray Commonly known as: ASTELIN Place 1 spray  into both nostrils 2 (two) times daily. What changed: Another medication with the same name was removed. Continue taking this medication, and follow the directions you see here. Changed by: Hollice Espy, MD   Flonase Sensimist 27.5 MCG/SPRAY nasal spray Generic drug: fluticasone Place 2 sprays into the nose daily as needed for rhinitis.   fludrocortisone 0.1 MG tablet Commonly known as: FLORINEF TAKE 1/2 TABLET BY MOUTH DAILY What changed: how much to take   HYDROcodone-acetaminophen 7.5-325 MG tablet Commonly known as: NORCO Take 1 tablet by mouth every 8 (eight) hours as needed for severe pain. Must last 30 days. What changed: Another medication with the same name was removed. Continue taking this medication, and follow the directions you see here. Changed by: Hollice Espy, MD   hydrocortisone 5 MG tablet Commonly known as: CORTEF 1 tablet with food or milk What changed: Another medication with the same name was removed. Continue taking this medication, and follow the directions you see here. Changed by: Hollice Espy, MD   levocetirizine 5 MG tablet Commonly known as: XYZAL Take 5 mg by mouth every evening.   meloxicam 15 MG tablet Commonly known as: MOBIC TAKE 1 TABLET BY MOUTH DAILY   pseudoephedrine 30 MG tablet Commonly known as: SUDAFED Take 30 mg by mouth every 12 (twelve) hours as needed for congestion.   tiZANidine 4 MG tablet Commonly known as: ZANAFLEX TAKE 1 TABLET  BY MOUTH EVERY 8 HOURS AS NEEDED FOR MUSCLE SPASMS.       Allergies:       Allergies  Allergen Reactions  . Lisinopril Cough  . Tramadol Other (See Comments)    Other reaction(s): muscle tension  . Sulfa Antibiotics Other (See Comments)    Family History:      Family History  Problem Relation Age of Onset  . Arthritis Mother   . Hyperlipidemia Mother   . Hypertension Mother   . High blood pressure Mother   . Alcohol abuse Father   . Hearing loss Father    . Hyperlipidemia Father   . High blood pressure Father     Social History:  reports that she quit smoking about 26 years ago. She has never used smokeless tobacco. She reports previous alcohol use. She reports current drug use.   Physical Exam: BP 135/85   Pulse 83   Ht 5\' 4"  (1.626 m)   Wt 190 lb (86.2 kg)   LMP 06/08/2018 (Exact Date)   BMI 32.61 kg/m   Constitutional:  Alert and oriented, No acute distress. HEENT: [place] AT, moist mucus membranes.  Trachea midline, no masses. Cardiovascular: No clubbing, cyanosis, or edema. Respiratory: Normal respiratory effort, no increased work of breathing. Skin: No rashes, bruises or suspicious lesions. Neurologic: Grossly intact, no focal deficits, moving all 4 extremities. Psychiatric: Normal mood and affect.  Laboratory Data: Recent Labs       Lab Results  Component Value Date   WBC 11.5 (H) 10/22/2020   HGB 14.0 10/22/2020   HCT 41.2 10/22/2020   MCV 92 10/22/2020   PLT 287 10/22/2020      Recent Labs       Lab Results  Component Value Date   CREATININE 0.69 10/07/2020       Recent Labs       Lab Results  Component Value Date   HGBA1C 6.1 04/30/2018      Pertinent Imaging: CT Renal Stone Study  Narrative CLINICAL DATA:  Left lower abdominal/flank and back pain that started last night at 11 p.m., kidney stone suspected.  EXAM: CT ABDOMEN AND PELVIS WITHOUT CONTRAST  TECHNIQUE: Multidetector CT imaging of the abdomen and pelvis was performed following the standard protocol without IV contrast.  COMPARISON:  None.  FINDINGS: Lower chest: No acute abnormality. Normal size heart. No pericardial effusion.  Hepatobiliary: Unremarkable noncontrast appearance of the hepatic parenchyma. Gallbladder surgically absent. No biliary ductal dilation.  Pancreas: Unremarkable  Spleen: Unremarkable  Adrenals/Urinary Tract: Bilateral adrenal glands are  unremarkable.  Nonobstructive right lower pole renal calculus which measures 1.1 cm on image 76/5. Nonobstructive staghorn type left upper pole renal calculus which measures 3.0 cm on image 87/5. There is perinephric and proximal periureteric stranding on the left. Urinary bladder is grossly unremarkable for degree of distension.  Stomach/Bowel: Stomach is within normal limits. Appendix appears normal. Colonic diverticulosis without findings of acute diverticulitis. No evidence of bowel wall thickening, distention, or inflammatory changes.  Vascular/Lymphatic: Aortic atherosclerosis. No enlarged abdominal or pelvic lymph nodes.  Reproductive: Uterus and right adnexa are unremarkable. There is a 2.6 cm left adnexal cyst. No follow-up imaging recommended. Note: This recommendation does not apply to premenarchal patients and to those with increased risk (genetic, family history, elevated tumor markers or other high-risk factors) of ovarian cancer. Reference: JACR 2020 Feb; 17(2):248-254  Other: No large volume abdominopelvic ascites.  Musculoskeletal: Multilevel degenerative changes spine. T10 and L3 vertebral body hemangiomas. Bilateral L5 pars  defects with grade 1 anterolisthesis of L5 on S1. No acute osseous abnormality.  IMPRESSION: 1. Nonobstructive staghorn type LEFT upper pole renal calculus which measures 3.0 cm, with perinephric and proximal periureteric stranding on the LEFT. Findings could be secondary to a recently passed stone or urinary tract infection. Correlate with urinalysis. 2. Nonobstructive right nephrolithiasis. 3. Colonic diverticulosis without findings of acute diverticulitis. 4. Bilateral L5 pars defects with grade 1 anterolisthesis of L5 on S1. 5. Aortic atherosclerosis.  Aortic Atherosclerosis (ICD10-I70.0).   Electronically Signed By: Dahlia Bailiff MD On: 10/06/2020 11:49  CT scan was personally reviewed.  Assessment & Plan:    1.  Staghorn calculus Left partial staghorn calculus measuring 3 cm  Based on size and location of the stone, she likely best be served with left PCNL.  Alternatives including staged ureteroscopy versus staged ESWL were also discussed.  We discussed the risk of surgery including risk of bleeding, infection, demonstrating structures, including to the lung and spleen amongst others especially given that his upper pole.  We discussed the procedure itself including need for at least overnight observation, risk of transfusion which is about 5% as well as the postoperative wound care.  We also discussed the possible need for staged procedure as well as ureteral stent.  All questions were answered today.  She will need stress test steroids intraoperatively.  2. Right kidney stone We will address #1 for sepsis appears to be the nidus for her most recent infection  Consider ureteroscopy versus ESWL staged fashion with #1 has been addressed  3. History of pyelonephritis Signs symptoms of pyelonephritis reviewed, will recheck a urine culture prior to the procedure.  We also discussed the risk of infected stone as well as intraoperative, postoperative risk of sepsis.  All questions answered.   Return for leah will call for surgery.  Hollice Espy, MD  [facility] [address] [place], [place] [address] [phone]

## 2020-12-14 NOTE — Plan of Care (Signed)
Patient arrived to floor from OR. Oriented to room and call bell.

## 2020-12-14 NOTE — Anesthesia Preprocedure Evaluation (Signed)
Anesthesia Evaluation  Patient identified by MRN, date of birth, ID band Patient awake    Reviewed: Allergy & Precautions, NPO status , Patient's Chart, lab work & pertinent test results  History of Anesthesia Complications (+) PONV and history of anesthetic complications  Airway Mallampati: III  TM Distance: <3 FB     Dental  (+) Teeth Intact   Pulmonary shortness of breath and with exertion, former smoker,    Pulmonary exam normal        Cardiovascular negative cardio ROS Normal cardiovascular exam     Neuro/Psych  Neuromuscular disease negative psych ROS   GI/Hepatic GERD  ,  Endo/Other  negative endocrine ROSAddison's disease  Renal/GU stones  negative genitourinary   Musculoskeletal  (+) Arthritis , Osteoarthritis,    Abdominal Normal abdominal exam  (+)   Peds negative pediatric ROS (+)  Hematology  (+) anemia ,   Anesthesia Other Findings Past Medical History: No date: Addison disease ([place]) No date: Arthritis No date: Back pain No date: Complication of anesthesia     Comment:  nausea No date: GERD (gastroesophageal reflux disease)  Reproductive/Obstetrics                             Anesthesia Physical Anesthesia Plan  ASA: III  Anesthesia Plan: General   Post-op Pain Management:    Induction: Intravenous  PONV Risk Score and Plan:   Airway Management Planned: Oral ETT  Additional Equipment:   Intra-op Plan:   Post-operative Plan: Extubation in OR  Informed Consent: I have reviewed the patients History and Physical, chart, labs and discussed the procedure including the risks, benefits and alternatives for the proposed anesthesia with the patient or authorized representative who has indicated his/her understanding and acceptance.     Dental advisory given  Plan Discussed with: CRNA and Surgeon  Anesthesia Plan Comments: (Solucortef 100mg  preop IV)         Anesthesia Quick Evaluation

## 2020-12-14 NOTE — Procedures (Signed)
Interventional Radiology Procedure Note  Procedure: Placement of separate upper and inter pole nephroureteral accesses.   Complications: None  Estimated Blood Loss: None  Recommendations: - To OR for PCNL as planned   Signed,  Criselda Peaches, MD

## 2020-12-14 NOTE — Transfer of Care (Signed)
Immediate Anesthesia Transfer of Care Note  Patient: Denise Burns  Procedure(s) Performed: NEPHROLITHOTOMY PERCUTANEOUS (Left )  Patient Location: PACU  Anesthesia Type:General  Level of Consciousness: drowsy  Airway & Oxygen Therapy: Patient Spontanous Breathing and Patient connected to face mask oxygen  Post-op Assessment: Report given to RN and Post -op Vital signs reviewed and stable  Post vital signs: Reviewed and stable  Last Vitals:  Vitals Value Taken Time  BP 134/77 12/14/20 1233  Temp    Pulse 82 12/14/20 1234  Resp 20 12/14/20 1236  SpO2 98 % 12/14/20 1234  Vitals shown include unvalidated device data.  Last Pain:  Vitals:   12/14/20 0716  TempSrc: Oral  PainSc: 0-No pain         Complications: No complications documented.

## 2020-12-14 NOTE — Progress Notes (Signed)
Informed pt's husband pt still in recovery and waiting on bed placement to Med Surg unit.  Pt VSS, mild pain, resting comfortably in bed with eyes closed.  Pt husband stated understanding.

## 2020-12-14 NOTE — Anesthesia Procedure Notes (Signed)
Procedure Name: Intubation Date/Time: 12/14/2020 10:05 AM Performed by: Lily Peer, Cj Beecher, CRNA Pre-anesthesia Checklist: Patient identified, Emergency Drugs available, Suction available and Patient being monitored Patient Re-evaluated:Patient Re-evaluated prior to induction Oxygen Delivery Method: Circle system utilized Preoxygenation: Pre-oxygenation with 100% oxygen Induction Type: IV induction Ventilation: Mask ventilation without difficulty Laryngoscope Size: McGraph and 3 Grade View: Grade I Tube type: Oral Tube size: 7.0 mm Number of attempts: 1 Airway Equipment and Method: Stylet Placement Confirmation: ETT inserted through vocal cords under direct vision,  positive ETCO2 and breath sounds checked- equal and bilateral Secured at: 21 cm Tube secured with: Tape Dental Injury: Teeth and Oropharynx as per pre-operative assessment

## 2020-12-15 ENCOUNTER — Encounter: Payer: Self-pay | Admitting: Urology

## 2020-12-15 DIAGNOSIS — N200 Calculus of kidney: Principal | ICD-10-CM

## 2020-12-15 LAB — BASIC METABOLIC PANEL
Anion gap: 7 (ref 5–15)
BUN: 12 mg/dL (ref 6–20)
CO2: 24 mmol/L (ref 22–32)
Calcium: 8 mg/dL — ABNORMAL LOW (ref 8.9–10.3)
Chloride: 107 mmol/L (ref 98–111)
Creatinine, Ser: 0.74 mg/dL (ref 0.44–1.00)
GFR, Estimated: >60 mL/min (ref >60)
Glucose, Bld: 128 mg/dL — ABNORMAL HIGH (ref 70–99)
Potassium: 4 mmol/L (ref 3.5–5.1)
Sodium: 138 mmol/L (ref 135–145)

## 2020-12-15 LAB — HEMOGLOBIN AND HEMATOCRIT, BLOOD
HCT: 34.6 % — ABNORMAL LOW (ref 36.0–46.0)
Hemoglobin: 11.8 g/dL — ABNORMAL LOW (ref 12.0–15.0)

## 2020-12-15 MED ORDER — KETOROLAC TROMETHAMINE 30 MG/ML IJ SOLN
30.0000 mg | Freq: Four times a day (QID) | INTRAMUSCULAR | Status: DC
  Administered 2020-12-15 – 2020-12-16 (×4): 30 mg via INTRAVENOUS
  Filled 2020-12-15 (×4): qty 1

## 2020-12-15 NOTE — Progress Notes (Signed)
Per RN Danae Chen, patient developed progressive worsening of her left flank pain following Foley removal at mid-day. Patient is tearful on rounding.  I reconnected a drainage bag to her left PCN, which immediately drained approximately 3-5ccs of red urine. Bag left to drain freely. Additionally Dr. Erlene Quan ordered Toradol, which was administered during my visit. Patient reported improvement in her discomfort shortly thereafter.  Will plan to keep patient overnight for pain control with plans to replug PCN tomorrow morning and attempt another clamp trial. Patient is in agreement with this plan.

## 2020-12-15 NOTE — Progress Notes (Signed)
Urology Inpatient Progress Note  Subjective: No acute events overnight. Creatinine stable today, 0.74. Hemoglobin down today, 11.8. Left PCN plugged this morning, Foley catheter remains in place draining pink urine. Patient reports left flank discomfort, worsening since PCN was plugged. She also reports feeling warm and is concerned for possible fever. Oral temp 99.2 F at the bedside.  Anti-infectives: Anti-infectives (From admission, onward)   Start     Dose/Rate Route Frequency Ordered Stop   12/14/20 1800  ceFAZolin (ANCEF) 1 g in sodium chloride 0.9 % 100 mL IVPB        1 g 200 mL/hr over 30 Minutes Intravenous Every 8 hours 12/14/20 1451 12/15/20 0136   12/14/20 0712  ceFAZolin (ANCEF) 2-4 GM/100ML-% IVPB       Note to Pharmacy: Norton Blizzard  : cabinet override      12/14/20 0712 12/14/20 1010   12/14/20 0010  ceFAZolin (ANCEF) IVPB 2g/100 mL premix        2 g 200 mL/hr over 30 Minutes Intravenous 30 min pre-op 12/14/20 0010 12/14/20 1025   12/14/20 0010  cefTRIAXone (ROCEPHIN) 2 g in sodium chloride 0.9 % 100 mL IVPB  Status:  Discontinued        2 g 200 mL/hr over 30 Minutes Intravenous Every 24 hours 12/14/20 0010 12/14/20 1431      Current Facility-Administered Medications  Medication Dose Route Frequency Provider Last Rate Last Admin  . 0.9 %  sodium chloride infusion   Intravenous Continuous Hollice Espy, MD 100 mL/hr at 12/15/20 1011 New Bag at 12/15/20 1011  . acetaminophen (TYLENOL) tablet 650 mg  650 mg Oral Q4H PRN Hollice Espy, MD      . azelastine (ASTELIN) 0.1 % nasal spray 1 spray  1 spray Each Nare BID Hollice Espy, MD   1 spray at 12/15/20 [phone]  . belladonna-opium (B&O) suppository 16.2-60mg   1 suppository Rectal Q6H PRN Hollice Espy, MD   1 suppository at 12/14/20 1405  . Chlorhexidine Gluconate Cloth 2 % PADS 6 each  6 each Topical Daily Hollice Espy, MD      . diphenhydrAMINE (BENADRYL) injection 12.5 mg  12.5 mg Intravenous Q6H PRN  Hollice Espy, MD       Or  . diphenhydrAMINE (BENADRYL) 12.5 MG/5ML elixir 12.5 mg  12.5 mg Oral Q6H PRN Hollice Espy, MD      . docusate sodium (COLACE) capsule 100 mg  100 mg Oral BID Hollice Espy, MD   100 mg at 12/15/20 0811  . fludrocortisone (FLORINEF) tablet 0.05 mg  0.05 mg Oral Daily Hollice Espy, MD   0.05 mg at 12/15/20 0811  . fluticasone (FLONASE) 50 MCG/ACT nasal spray 1 spray  1 spray Each Nare Daily Hollice Espy, MD   1 spray at 12/15/20 0813  . heparin injection 5,000 Units  5,000 Units Subcutaneous Q8H Hollice Espy, MD   5,000 Units at 12/15/20 0448  . hydrocortisone (CORTEF) tablet 15 mg  15 mg Oral q morning Hollice Espy, MD   15 mg at 12/15/20 3557  . hydrocortisone (CORTEF) tablet 5 mg  5 mg Oral QPM Hollice Espy, MD   5 mg at 12/14/20 1758  . loratadine (CLARITIN) tablet 10 mg  10 mg Oral QPM Hollice Espy, MD   10 mg at 12/14/20 1655  . morphine 2 MG/ML injection 2-4 mg  2-4 mg Intravenous Q2H PRN Hollice Espy, MD   4 mg at 12/15/20 1218  . ondansetron (ZOFRAN) injection 4 mg  4 mg Intravenous Q4H  PRN Hollice Espy, MD      . oxybutynin [facility]) tablet 5 mg  5 mg Oral Q8H PRN Hollice Espy, MD   5 mg at 12/14/20 1655  . oxyCODONE-acetaminophen (PERCOCET/ROXICET) 5-325 MG per tablet 1-2 tablet  1-2 tablet Oral Q4H PRN Hollice Espy, MD   2 tablet at 12/15/20 1010  . tiZANidine (ZANAFLEX) tablet 4 mg  4 mg Oral Q8H PRN Hollice Espy, MD       Objective: Vital signs in last 24 hours: Temp:  [97.4 F (36.3 C)-99.2 F (37.3 C)] 99.2 F (37.3 C) (05/17 1234) Pulse Rate:  [78-98] 85 (05/17 0740) Resp:  [15-28] 18 (05/17 0740) BP: (113-152)/(66-94) 125/67 (05/17 0740) SpO2:  [94 %-100 %] 97 % (05/17 0740)  Intake/Output from previous day: 05/16 0701 - 05/17 0700 In: 2917.2 [P.O.:240; I.V.:2477.2; IV Piggyback:200] Out: 2550 [Urine:2350; Blood:200] Intake/Output this shift: Total I/O In: 573.2 [P.O.:240; I.V.:333.2] Out: 350  [Urine:350]  Physical Exam Vitals and nursing note reviewed.  Constitutional:      General: She is not in acute distress.    Appearance: She is not toxic-appearing or diaphoretic.     Comments: Flushed facies  HENT:     Head: Normocephalic and atraumatic.  Pulmonary:     Effort: Pulmonary effort is normal. No respiratory distress.  Skin:    General: Skin is warm and dry.  Neurological:     Mental Status: She is alert and oriented to person, place, and time.  Psychiatric:        Mood and Affect: Mood normal.        Behavior: Behavior normal.    Lab Results:  Recent Labs    12/14/20 0723 12/15/20 0514  WBC 10.1  --   HGB 13.9 11.8*  HCT 40.5 34.6*  PLT 223  --    BMET Recent Labs    12/15/20 0514  NA 138  K 4.0  CL 107  CO2 24  GLUCOSE 128*  BUN 12  CREATININE 0.74  CALCIUM 8.0*   PT/INR Recent Labs    12/14/20 0723  LABPROT 12.7  INR 1.0   Assessment & Plan: 50 year old female POD 1 from left PCNL with Dr. Erlene Quan for management of a left partial staghorn calculus.  She has had some left flank pain in the setting of her clamp trial, though Foley catheter has remained in place.  She is afebrile but reports subjective warmth and is noted to have flushed facies on physical exam.  I removed her Foley catheter at the bedside today.  I drained 10 cc of water from the Foley balloon and removed the catheter in its entirety.  Patient tolerated well.  I left the left PCN in place, plugged.  Will reassess this afternoon.  If she has any new fevers or increased pain, will keep her overnight for close monitoring. Patient is in agreement with this plan.  Debroah Loop, PA-C 12/15/2020

## 2020-12-15 NOTE — Progress Notes (Signed)
Patient is in a significant amount of pain. She is crying. She received  morphine about 1 1/2 hours ago. she said it did not help.  It seems like her pain has gotten worse as the day went on. she is still draining right much from around nephrostomy drain. Denise Burns and Dr Erlene Quan notified and are coming to see the patient. Toradol ordered

## 2020-12-15 NOTE — Progress Notes (Signed)
Dr Erlene Quan and Aldona Bar Vailloancourt PA notified that the patients is draining from around her nephrostomy tube

## 2020-12-16 ENCOUNTER — Telehealth: Payer: Self-pay | Admitting: Physician Assistant

## 2020-12-16 ENCOUNTER — Other Ambulatory Visit (HOSPITAL_BASED_OUTPATIENT_CLINIC_OR_DEPARTMENT_OTHER): Payer: Self-pay

## 2020-12-16 ENCOUNTER — Other Ambulatory Visit: Payer: Self-pay

## 2020-12-16 MED ORDER — OXYCODONE HCL 5 MG PO TABS
5.0000 mg | ORAL_TABLET | Freq: Four times a day (QID) | ORAL | 0 refills | Status: DC | PRN
  Filled 2020-12-16: qty 10, 3d supply, fill #0

## 2020-12-16 MED ORDER — DOCUSATE SODIUM 100 MG PO CAPS
100.0000 mg | ORAL_CAPSULE | Freq: Two times a day (BID) | ORAL | 0 refills | Status: DC
  Filled 2020-12-16: qty 10, 5d supply, fill #0

## 2020-12-16 NOTE — Discharge Summary (Addendum)
Date of admission: 12/14/2020  Date of discharge: 12/16/2020  Admission diagnosis: Left partial staghorn calculus  Discharge diagnosis: Same as above  Secondary diagnoses:  Patient Active Problem List   Diagnosis Date Noted  . Left nephrolithiasis 12/14/2020  . Urinary tract infection with hematuria 11/30/2020  . Nausea 11/30/2020  . Dysuria 11/30/2020  . Postmenopausal bleeding 11/02/2020  . Chronic use of opiate for therapeutic purpose 11/02/2020  . Uncomplicated opioid dependence ([place]) 11/02/2020  . Encounter to establish care 10/28/2020  . Abnormal kidney function 10/28/2020  . Iron deficiency anemia 10/28/2020  . Encounter for screening mammogram for malignant neoplasm of breast 10/28/2020  . Screening for colon cancer 10/28/2020  . Kidney stones   . Sepsis ([place])   . Pyelonephritis 10/06/2020  . 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 Chronic low back pain (Midline) w/o sciatica 09/08/2020  . Allergic rhinitis 08/31/2020  . Allergic rhinitis due to animal (cat) (dog) hair and dander 08/31/2020  . Chronic allergic conjunctivitis 08/31/2020  . Deviated nasal septum 08/31/2020  . Shortness of breath 08/31/2020  . Primary osteoarthritis of both hips 08/20/2020  . Hair loss 08/14/2020  . Chronic hip pain (Bilateral) 08/03/2020  . Enthesopathy hip region (Left) 08/03/2020  . Lumbar radiculitis (L1/L2) (Right) 05/11/2020  . Chronic groin pain (Right) 05/11/2020  . Osteoarthritis of hip (Right) 02/20/2020  . Chronic hip pain (Right) 02/11/2020  . Spondylosis without myelopathy or radiculopathy, lumbosacral region 01/16/2019  . Lumbar spondylosis 10/17/2018  . Abnormal MRI, lumbar spine (03/22/2018) 09/03/2018  . Lumbar facet hypertrophy (Multilevel) (Bilateral) 09/03/2018  . Lumbar facet syndrome (Bilateral) 09/03/2018  . DDD (degenerative disc disease), lumbosacral 09/03/2018  . Spondylolisthesis (8 mm) at L5-S1 level 09/03/2018  . Lumbar Grade 1 Retrolisthesis (2 mm) of L2/L3  09/03/2018  . L5-S1 pars defect w/ spondylolisthesis (Bilateral) 09/03/2018  . Lumbar pars defect (L5-S1) (Bilateral) 09/03/2018  . Lumbar foraminal stenosis (Left: L2-3) (Bilateral: L5-S1) 09/03/2018  . Osteoarthritis of sacroiliac joint (Bilateral) 09/03/2018  . Chronic musculoskeletal pain 09/03/2018  . Neurogenic pain 09/03/2018  . Osteoarthritis of facet joint of lumbar spine 09/03/2018  . Greater trochanteric bursitis (Bilateral) 09/03/2018  . Elevated sed rate 08/21/2018  . Chronic low back pain (1ry area of Pain) (Bilateral) (R>L) w/o sciatica 08/16/2018  . Chronic sacroiliac joint pain (2ry area of Pain) (Right) 08/16/2018  . Chronic pain syndrome 08/16/2018  . Long term current use of opiate analgesic 08/16/2018  . Pharmacologic therapy 08/16/2018  . Disorder of skeletal system 08/16/2018  . Problems influencing health status 08/16/2018  . Premature ovarian failure 08/06/2018  . Addison's disease ([place]) 04/30/2018  . Class 1 obesity due to excess calories without serious comorbidity with body mass index (BMI) of 31.0 to 31.9 in adult 04/30/2018  . Chronic midline low back pain with bilateral sciatica 04/30/2018    History and Physical: For full details, please see admission history and physical. Briefly, Denise Burns is a 50 y.o. year old patient admitted on 12/14/2020 for scheduled PCNL with Dr. Erlene Quan.   Patient reports significant improvement in her left flank pain today. Her left PCN was clamped this morning and she has tolerated this trial well without acute concerns. Her left PCN and left nephroureteral catheter remain in place and I removed them at the bedside today without difficulty or bleeding.  Physical Exam: Constitutional:  Alert and oriented, no acute distress, nontoxic appearing HEENT: [place], AT, no flushed facies Cardiovascular: No clubbing, cyanosis, or edema Respiratory: Normal respiratory effort, no increased work  of breathing Skin: No rashes, bruises  or suspicious lesions Neurologic: Grossly intact, no focal deficits, moving all 4 extremities Psychiatric: Normal mood and affect  Hospital Course: Patient tolerated the procedure well.  She was then transferred to the floor after an uneventful PACU stay.  Her hospital course was uncomplicated.  On POD#2 she had met discharge criteria: was eating a regular diet, was up and ambulating independently,  pain was well controlled, was voiding without a catheter, nephrostomy tube and nephroureteral catheter had been removed, and was ready for discharge.  Laboratory values:  Recent Labs    12/14/20 0723 12/15/20 0514  WBC 10.1  --   HGB 13.9 11.8*  HCT 40.5 34.6*   Recent Labs    12/15/20 0514  NA 138  K 4.0  CL 107  CO2 24  GLUCOSE 128*  BUN 12  CREATININE 0.74  CALCIUM 8.0*   Recent Labs    12/14/20 0723  INR 1.0   Results for orders placed or performed during the hospital encounter of 12/11/20  Urine culture     Status: Abnormal   Collection Time: 12/11/20 10:03 AM   Specimen: Urine, Clean Catch  Result Value Ref Range Status   Specimen Description   Final    URINE, CLEAN CATCH Performed at [facility], [address] [place], [place] [address]    Special Requests   Final    NONE Performed at [facility], [address], [place], [place] [address]    Culture (A)  Final    <10,000 COLONIES/mL INSIGNIFICANT GROWTH Performed at [facility], [place] [address], [place], [place] [address]    Report Status 12/12/2020 FINAL  Final  SARS CORONAVIRUS 2 (TAT 6-24 HRS) Nasopharyngeal Nasopharyngeal Swab     Status: None   Collection Time: 12/11/20 10:11 AM   Specimen: Nasopharyngeal Swab  Result Value Ref Range Status   SARS Coronavirus 2 NEGATIVE NEGATIVE Final    Comment: (NOTE) SARS-CoV-2 target nucleic acids are NOT DETECTED.  The SARS-CoV-2 RNA is generally detectable in upper and lower respiratory specimens during the acute phase of  infection. Negative results do not preclude SARS-CoV-2 infection, do not rule out co-infections with other pathogens, and should not be used as the sole basis for treatment or other patient management decisions. Negative results must be combined with clinical observations, patient history, and epidemiological information. The expected result is Negative.  Fact Sheet for Patients: SugarRoll.be  Fact Sheet for Healthcare Providers: https://www.woods-mathews.com/  This test is not yet approved or cleared by the [place] FDA and  has been authorized for detection and/or diagnosis of SARS-CoV-2 by FDA under an Emergency Use Authorization (EUA). This EUA will remain  in effect (meaning this test can be used) for the duration of the COVID-19 declaration under Se ction 564(b)(1) of the Act, 21 U.S.C. section 360bbb-3(b)(1), unless the authorization is terminated or revoked sooner.  Performed at [facility], [place] [address] [place], [place] [address]    Disposition: Home  Discharge instruction: The patient was instructed to be ambulatory but told to refrain from heavy lifting, strenuous activity, or driving. She was counseled to keep her PCN site covered with waterproof dressing and that she can expect to have significant urinary drainage from this site as it heals.  Discharge medications:  Allergies as of 12/16/2020      Reactions   Lisinopril Cough   Tramadol Other (See Comments)   Other reaction(s): muscle tension  Sulfa Antibiotics Other (See Comments)   Blisters in mouth       Medication List    TAKE these medications   azelastine 0.1 % nasal spray Commonly known as: ASTELIN Place 1 spray into both nostrils 2 (two) times daily.   azelastine 0.1 % nasal spray Commonly known as: ASTELIN USE 1-2 PUFFS IN EACH NOSTRIL AS NEEDED TWICE A DAY 30 DAY(S)   cephALEXin 500 MG capsule Commonly known as: KEFLEX Take 1 capsule  (500 mg total) by mouth 3 (three) times daily.   docusate sodium 100 MG capsule Commonly known as: COLACE Take 1 capsule (100 mg total) by mouth 2 (two) times daily.   Flonase Sensimist 27.5 MCG/SPRAY nasal spray Generic drug: fluticasone Place 1 spray into the nose in the morning and at bedtime.   fludrocortisone 0.1 MG tablet Commonly known as: FLORINEF TAKE 1/2 TABLET BY MOUTH DAILY What changed: how much to take   HYDROcodone-acetaminophen 7.5-325 MG tablet Commonly known as: NORCO Take 1 tablet by mouth every 8 (eight) hours as needed for severe pain. Must last 30 days.   HYDROcodone-acetaminophen 7.5-325 MG tablet Commonly known as: NORCO Take 1 tablet by mouth every 8 (eight) hours as needed for severe pain. Must last 30 days.   HYDROcodone-acetaminophen 7.5-325 MG tablet Commonly known as: NORCO Take 1 tablet by mouth every 8 (eight) hours as needed for severe pain. Must last 30 days. Start taking on: January 06, 2021   hydrocortisone 10 MG tablet Commonly known as: CORTEF Take 1 and 1/2 tablet by mouth daily with breakfast and 1/2 tablet daily in the evening What changed:   how much to take  when to take this  additional instructions   levocetirizine 5 MG tablet Commonly known as: XYZAL Take 5 mg by mouth every evening.   levocetirizine 5 MG tablet Commonly known as: XYZAL TAKE 1 TABLET BY MOUTH ONCE DAILY IN THE EVENING   meloxicam 15 MG tablet Commonly known as: MOBIC TAKE 1 TABLET BY MOUTH DAILY   oxyCODONE 5 MG immediate release tablet Commonly known as: Oxy IR/ROXICODONE Take 1 tablet (5 mg total) by mouth every 6 (six) hours as needed for breakthrough pain.   promethazine 25 MG tablet Commonly known as: PHENERGAN Take 1 tablet (25 mg total) by mouth every 8 (eight) hours as needed for nausea or vomiting.   pseudoephedrine 30 MG tablet Commonly known as: SUDAFED Take 30 mg by mouth every 12 (twelve) hours as needed for congestion.   tiZANidine  4 MG tablet Commonly known as: ZANAFLEX TAKE 1 TABLET BY MOUTH EVERY 8 HOURS AS NEEDED FOR MUSCLE SPASMS. What changed: how much to take       Followup:   Follow-up Information    Hollice Espy, MD In 4 weeks.   Specialty: Urology Why: For postop follow-up with renal ultrasound prior Contact information: [address] [place] [address] [phone]

## 2020-12-16 NOTE — Telephone Encounter (Signed)
please schedule her for postop follow-up with Dr. Erlene Quan with renal US prior in 1 month  appt made  [facility]

## 2020-12-16 NOTE — Discharge Instructions (Signed)
Percutaneous Nephrolithotomy, Care After  This sheet gives you information about how to care for yourself after your procedure. Your health care provider may also give you more specific instructions. If you have problems or questions, contact your health care provider.  What can I expect after the procedure?  After the procedure, it is common to have:  · Soreness or pain.  · A small amount of blood or clear fluid coming from your incision for a few days.  · Fatigue.  · Some blood in your urine. This will last for a few days.  Follow these instructions at home:  Medicines  · Take over-the-counter and prescription medicines only as told by your health care provider.  · If you were prescribed an antibiotic medicine, take it as told by your health care provider. Do not stop using the antibiotic even if you start to feel better.  · Ask your health care provider if the medicine prescribed to you:  ? Requires you to avoid driving or using heavy machinery.  ? Can cause constipation. You may need to take these actions to prevent or treat constipation:  § Drink enough fluid to keep your urine pale yellow.  § Take over-the-counter or prescription medicines.  § Eat foods that are high in fiber, such as beans, whole grains, and fresh fruits and vegetables.  § Limit foods that are high in fat and processed sugars, such as fried or sweet foods.  Incision care    · Follow instructions from your health care provider about how to take care of your incision. Make sure you:  ? Wash your hands with soap and water before and after you change your bandage (dressing). If soap and water are not available, use hand sanitizer.  ? Change your dressing as told by your health care provider.  ? Leave stitches (sutures), skin glue, or adhesive strips in place. These skin closures may need to stay in place for 2 weeks or longer. If adhesive strip edges start to loosen and curl up, you may trim the loose edges. Do not remove adhesive strips  completely unless your health care provider tells you to do that.  · Check your incision area every day for signs of infection. Check for:  ? Redness, swelling, or pain.  ? More fluid or blood.  ? Warmth.  ? Pus or a bad smell.  · Do not take baths, swim, or use a hot tub until your health care provider approves. Ask your health care provider if you may take showers. You may only be allowed to take sponge baths.  Activity  · Avoid strenuous activities for as long as told by your health care provider.  · Return to your normal activities as told by your health care provider. Ask your health care provider what activities are safe for you.  General instructions  · If you were sent home with a catheter or surgical drain (nephrostomy tube), follow your health care provider's instructions on how to take care of it.  · Wear compression stockings as told by your health care provider. These stockings help to prevent blood clots and reduce swelling in your legs.  · Do not use any products that contain nicotine or tobacco, such as cigarettes, e-cigarettes, and chewing tobacco. These can delay incision healing after surgery. If you need help quitting, ask your health care provider.  · Keep all follow-up visits as told by your health care provider. This is important.  ? If you still have   a stent, your health care provider will need to remove it after 1-2 weeks.  Contact a health care provider if:  · You have a fever.  · You have redness, swelling, or pain around your incision.  · You have more fluid or blood coming from your incision.  · Your incision feels warm to the touch.  · You have pus or a bad smell coming from your incision.  · You lose your appetite.  · You feel nauseous or you vomit.  · You have been sent home with a urinary catheter or a surgical drain and urine flow suddenly stops, followed by kidney pain.  Get help right away if:  · You notice a large amount of blood in your urine.  · You have blood clots in your  urine.  · You cannot urinate.  · You have chest pain or trouble breathing.  Summary  · After the procedure, it is common to feel soreness and discomfort. You may also see blood in your urine.  · You will be told how to care for yourself after the procedure. Follow instructions on how to care for your incision and how to recognize signs of infection.  · Avoid activities that require great effort. Return to activities as told by your health care provider.  · Get help right away if you have blood clots in your urine, you cannot urinate, or you have trouble breathing.  This information is not intended to replace advice given to you by your health care provider. Make sure you discuss any questions you have with your health care provider.  Document Revised: 02/07/2018 Document Reviewed: 02/07/2018  Elsevier Patient Education © 2021 [employer]

## 2020-12-16 NOTE — Progress Notes (Signed)
AVS reviewed and pt verbalized understanding of all instructions. NAD noted prior to discharge and no concerns voiced. 

## 2020-12-18 NOTE — Anesthesia Postprocedure Evaluation (Signed)
Anesthesia Post Note  Patient: Denise Burns  Procedure(s) Performed: NEPHROLITHOTOMY PERCUTANEOUS (Left )  Patient location during evaluation: PACU Anesthesia Type: General Level of consciousness: awake and alert and oriented Pain management: pain level controlled Vital Signs Assessment: post-procedure vital signs reviewed and stable Respiratory status: spontaneous breathing Cardiovascular status: blood pressure returned to baseline Anesthetic complications: no   No complications documented.   Last Vitals:  Vitals:   12/15/20 1234 12/15/20 2038  BP:    Pulse:    Resp:    Temp: 37.3 C 36.8 C  SpO2:      Last Pain:  Vitals:   12/16/20 1009  TempSrc:   PainSc: 6                  Icarus Partch

## 2020-12-21 ENCOUNTER — Other Ambulatory Visit: Payer: Self-pay

## 2020-12-21 MED FILL — Fludrocortisone Acetate Tab 0.1 MG: ORAL | 90 days supply | Qty: 45 | Fill #0 | Status: AC

## 2020-12-21 MED FILL — Tizanidine HCl Tab 4 MG (Base Equivalent): ORAL | 30 days supply | Qty: 90 | Fill #0 | Status: AC

## 2020-12-22 LAB — CALCULI, WITH PHOTOGRAPH (CLINICAL LAB)
Carbonate Apatite: 40 %
Mg NH4 PO4 (Struvite): 60 %
Weight Calculi: 1491 mg

## 2020-12-31 DIAGNOSIS — N83202 Unspecified ovarian cyst, left side: Secondary | ICD-10-CM | POA: Diagnosis not present

## 2021-01-05 DIAGNOSIS — R102 Pelvic and perineal pain: Secondary | ICD-10-CM | POA: Diagnosis not present

## 2021-01-05 DIAGNOSIS — D219 Benign neoplasm of connective and other soft tissue, unspecified: Secondary | ICD-10-CM | POA: Diagnosis not present

## 2021-01-05 DIAGNOSIS — N83202 Unspecified ovarian cyst, left side: Secondary | ICD-10-CM | POA: Diagnosis not present

## 2021-01-05 DIAGNOSIS — N939 Abnormal uterine and vaginal bleeding, unspecified: Secondary | ICD-10-CM | POA: Diagnosis not present

## 2021-01-06 ENCOUNTER — Other Ambulatory Visit: Payer: Self-pay

## 2021-01-06 MED FILL — Levocetirizine Dihydrochloride Tab 5 MG: ORAL | 30 days supply | Qty: 30 | Fill #1 | Status: CN

## 2021-01-07 ENCOUNTER — Telehealth: Payer: Self-pay | Admitting: Nurse Practitioner

## 2021-01-07 NOTE — Telephone Encounter (Signed)
Patient thinks she has a UTI, please advise, thanks.

## 2021-01-08 ENCOUNTER — Ambulatory Visit: Payer: BC Managed Care – PPO | Admitting: Nurse Practitioner

## 2021-01-12 ENCOUNTER — Other Ambulatory Visit: Payer: Self-pay

## 2021-01-12 ENCOUNTER — Ambulatory Visit (AMBULATORY_SURGERY_CENTER): Payer: BC Managed Care – PPO

## 2021-01-12 VITALS — Ht 64.0 in | Wt 180.0 lb

## 2021-01-12 DIAGNOSIS — Z1211 Encounter for screening for malignant neoplasm of colon: Secondary | ICD-10-CM

## 2021-01-12 MED ORDER — PLENVU 140 G PO SOLR
1.0000 | ORAL | 0 refills | Status: DC
  Filled 2021-01-12: qty 2, 1d supply, fill #0

## 2021-01-12 NOTE — Progress Notes (Signed)
Pre visit completed via phone call; Patient verified name, DOB, and address; No egg or soy allergy known to patient  No issues with past sedation with any surgeries or procedures---PONV noted per pt Patient denies ever being told they had issues or difficulty with intubation  No FH of Malignant Hyperthermia No diet pills per patient No home 02 use per patient  No blood thinners per patient  Pt denies issues with constipation at this time; No A fib or A flutter  EMMI video via [place] [facility] guidelines implemented in PV today with Pt and RN  Pt is fully vaccinated for Covid x 2;  Coupon given to pt in PV today, Code to Pharmacy and  NO PA's for preps discussed with pt in PV today  Discussed with pt there will be an out-of-pocket cost for prep and that varies from $0 to 70 dollars   Due to the COVID-19 pandemic we are asking patients to follow certain guidelines.  Pt aware of COVID protocols and [facility] guidelines

## 2021-01-13 ENCOUNTER — Other Ambulatory Visit: Payer: Self-pay

## 2021-01-20 ENCOUNTER — Ambulatory Visit
Admission: RE | Admit: 2021-01-20 | Discharge: 2021-01-20 | Disposition: A | Discharge status: Home / Self Care | Payer: BC Managed Care – PPO | Source: Ambulatory Visit | Attending: Physician Assistant | Admitting: Physician Assistant

## 2021-01-20 ENCOUNTER — Other Ambulatory Visit: Payer: Self-pay

## 2021-01-20 DIAGNOSIS — N200 Calculus of kidney: Secondary | ICD-10-CM | POA: Insufficient documentation

## 2021-01-21 ENCOUNTER — Other Ambulatory Visit: Payer: Self-pay

## 2021-01-21 ENCOUNTER — Ambulatory Visit (INDEPENDENT_AMBULATORY_CARE_PROVIDER_SITE_OTHER): Payer: BC Managed Care – PPO | Admitting: Nurse Practitioner

## 2021-01-21 ENCOUNTER — Encounter: Payer: Self-pay | Admitting: Nurse Practitioner

## 2021-01-21 VITALS — BP 137/84 | HR 105 | Temp 98.5°F | Ht 64.0 in | Wt 190.1 lb

## 2021-01-21 DIAGNOSIS — Z0001 Encounter for general adult medical examination with abnormal findings: Secondary | ICD-10-CM | POA: Diagnosis not present

## 2021-01-21 DIAGNOSIS — N200 Calculus of kidney: Secondary | ICD-10-CM | POA: Diagnosis not present

## 2021-01-21 DIAGNOSIS — M5442 Lumbago with sciatica, left side: Secondary | ICD-10-CM

## 2021-01-21 DIAGNOSIS — M47816 Spondylosis without myelopathy or radiculopathy, lumbar region: Secondary | ICD-10-CM | POA: Diagnosis not present

## 2021-01-21 DIAGNOSIS — G8929 Other chronic pain: Secondary | ICD-10-CM

## 2021-01-21 DIAGNOSIS — M5441 Lumbago with sciatica, right side: Secondary | ICD-10-CM | POA: Diagnosis not present

## 2021-01-21 DIAGNOSIS — M461 Sacroiliitis, not elsewhere classified: Secondary | ICD-10-CM

## 2021-01-21 MED ORDER — TIZANIDINE HCL 4 MG PO TABS
4.0000 mg | ORAL_TABLET | Freq: Three times a day (TID) | ORAL | 2 refills | Status: DC | PRN
  Filled 2021-01-21 – 2021-02-05 (×2): qty 90, 30d supply, fill #0
  Filled 2021-05-25: qty 90, 30d supply, fill #1
  Filled 2021-09-13: qty 90, 30d supply, fill #2

## 2021-01-21 MED ORDER — MELOXICAM 15 MG PO TABS
15.0000 mg | ORAL_TABLET | Freq: Every day | ORAL | 0 refills | Status: DC
  Filled 2021-01-21 – 2021-03-01 (×2): qty 90, 90d supply, fill #0

## 2021-01-21 NOTE — Progress Notes (Signed)
Established Patient Office Visit  Subjective:  Patient ID: Denise Burns, female    DOB: [birth year]  Age: 50 y.o. MRN: [id number]  CC:  Chief Complaint  Patient presents with   Annual Exam    HPI Denise Burns presents for annual wellness visit.  She states that in general she is doing okay.  Most recent labs were performed in May 2022.  These results were within normal limits.  She does need to have fasting blood work prior to her next visit.  That is estimated to be in about 3 months.  She is scheduled for a screening mammogram on 01/28/2021.  She is scheduled for screening colonoscopy on 01/27/2021.  She does have chronic, generalized pain.  She sees a pain management provider.  Primary care is responsible for prescribing meloxicam and tizanidine.  She needs refills for those medications today. She has no new concerns or complaints today.  She denies chest pain, chest pressure, or shortness of breath. She denies headaches or visual disturbances. She denies abdominal pain, nausea, vomiting, or changes in bowel or bladder habits.    Past Medical History:  Diagnosis Date   Addison disease ([place])    Arthritis    Back pain    Complication of anesthesia    nausea   GERD (gastroesophageal reflux disease)     Past Surgical History:  Procedure Laterality Date   CHOLECYSTECTOMY  2010   dental work     DIAGNOSTIC LAPAROSCOPY  2004   endometriosis dx   DILATION AND CURETTAGE OF UTERUS  2010   IR NEPHROSTOMY PLACEMENT LEFT  12/14/2020   NEPHROLITHOTOMY Left 12/14/2020   Procedure: NEPHROLITHOTOMY PERCUTANEOUS;  Surgeon: Hollice Espy, MD;  Location: [facility] ORS;  Service: Urology;  Laterality: Left;   POLYPECTOMY     WISDOM TOOTH EXTRACTION      Family History  Problem Relation Age of Onset   Arthritis Mother    Hyperlipidemia Mother    Hypertension Mother    High blood pressure Mother    Alcohol abuse Father    Hearing loss Father    Hyperlipidemia Father    High blood  pressure Father    Colon polyps Maternal Grandmother 34   Colon cancer Neg Hx    Esophageal cancer Neg Hx    Stomach cancer Neg Hx    Rectal cancer Neg Hx     Social History   Socioeconomic History   Marital status: Married    Spouse name: Legrand Como   Number of children: Not on file   Years of education: Not on file   Highest education level: Not on file  Occupational History   Not on file  Tobacco Use   Smoking status: Former    Pack years: 0.00    Types: Cigarettes    Quit date: 08/01/1994    Years since quitting: 26.5   Smokeless tobacco: Never  Vaping Use   Vaping Use: Never used  Substance and Sexual Activity   Alcohol use: Not Currently    Comment: once per 6 months   Drug use: Never   Sexual activity: Yes    Partners: Male    Comment: menopause  Other Topics Concern   Not on file  Social History Narrative   Not on file   Social Determinants of Health   Financial Resource Strain: Not on file  Food Insecurity: Not on file  Transportation Needs: Not on file  Physical Activity: Not on file  Stress: Not on file  Social Connections: Not on file  Intimate Partner Violence: Not on file    Outpatient Medications Prior to Visit  Medication Sig Dispense Refill   azelastine (ASTELIN) 0.1 % nasal spray Place 1 spray into both nostrils 2 (two) times daily.     azelastine (ASTELIN) 0.1 % nasal spray USE 1-2 PUFFS IN EACH NOSTRIL AS NEEDED TWICE A DAY 30 DAY(S) 30 mL 3   fludrocortisone (FLORINEF) 0.1 MG tablet TAKE 1/2 TABLET BY MOUTH DAILY (Patient taking differently: Take 0.05 mg by mouth daily.) 45 tablet 2   fluticasone (FLONASE SENSIMIST) 27.5 MCG/SPRAY nasal spray Place 1 spray into the nose in the morning and at bedtime.     hydrocortisone (CORTEF) 10 MG tablet Take 1 and 1/2 tablet by mouth daily with breakfast and 1/2 tablet daily in the evening (Patient taking differently: Take 5-15 mg by mouth See admin instructions. 15 mg in the morning, 5 mg in the evening)  180 tablet 0   PEG-KCl-NaCl-NaSulf-Na Asc-C (PLENVU) 140 g SOLR Take 1 kit by mouth as directed. Manufacturer's coupon Universal coupon code:BIN: P2366821; GROUP: [id number]; PCN: [id number]; ID: [id number]; PAY NO MORE $50; NO prior authorization 1 each 0   promethazine (PHENERGAN) 25 MG tablet Take 1 tablet (25 mg total) by mouth every 8 (eight) hours as needed for nausea or vomiting. 20 tablet 0   pseudoephedrine (SUDAFED) 30 MG tablet Take 30 mg by mouth every 12 (twelve) hours as needed for congestion.      HYDROcodone-acetaminophen (NORCO) 7.5-325 MG tablet Take 1 tablet by mouth every 8 (eight) hours as needed for severe pain. Must last 30 days. 90 tablet 0   meloxicam (MOBIC) 15 MG tablet TAKE 1 TABLET BY MOUTH DAILY 90 tablet 0   tiZANidine (ZANAFLEX) 4 MG tablet TAKE 1 TABLET BY MOUTH EVERY 8 HOURS AS NEEDED FOR MUSCLE SPASMS. (Patient taking differently: Take 4 mg by mouth every 8 (eight) hours as needed for muscle spasms.) 90 tablet 2   HYDROcodone-acetaminophen (NORCO) 7.5-325 MG tablet Take 1 tablet by mouth every 8 (eight) hours as needed for severe pain. Must last 30 days. 90 tablet 0   HYDROcodone-acetaminophen (NORCO) 7.5-325 MG tablet Take 1 tablet by mouth every 8 (eight) hours as needed for severe pain. Must last 30 days. 90 tablet 0   No facility-administered medications prior to visit.    Allergies  Allergen Reactions   Lisinopril Cough    Other reaction(s): rash   Sulfacetamide Sodium (Acne)     Other reaction(s): sores   Tramadol Other (See Comments)    Other reaction(s): muscle tension Other reaction(s): muscle tension   Sulfa Antibiotics Other (See Comments)    Blisters in mouth     ROS Review of Systems  Constitutional:  Positive for fatigue. Negative for activity change, appetite change, chills and fever.  HENT:  Negative for congestion, postnasal drip, rhinorrhea, sinus pressure and sinus pain.   Eyes: Negative.   Respiratory:  Negative for cough, chest tightness  and wheezing.   Cardiovascular:  Negative for chest pain and palpitations.  Gastrointestinal:  Negative for constipation, diarrhea, nausea and vomiting.  Endocrine: Negative for cold intolerance, heat intolerance, polydipsia and polyuria.  Genitourinary: Negative.   Musculoskeletal:  Positive for arthralgias and myalgias. Negative for back pain.  Skin:  Negative for rash.  Allergic/Immunologic: Negative.   Neurological:  Negative for dizziness, weakness and headaches.  Psychiatric/Behavioral:  Positive for dysphoric mood. The patient is nervous/anxious.   All other systems reviewed and are negative.  Objective:    Physical Exam Vitals and nursing note reviewed.  Constitutional:      Appearance: Normal appearance. She is well-developed.  HENT:     Head: Normocephalic and atraumatic.     Right Ear: Ear canal and external ear normal.     Left Ear: Ear canal and external ear normal.     Nose: Nose normal.     Mouth/Throat:     Mouth: Mucous membranes are moist.     Pharynx: Oropharynx is clear.  Eyes:     Extraocular Movements: Extraocular movements intact.     Conjunctiva/sclera: Conjunctivae normal.     Pupils: Pupils are equal, round, and reactive to light.  Cardiovascular:     Rate and Rhythm: Normal rate and regular rhythm.     Pulses: Normal pulses.     Heart sounds: Normal heart sounds.  Pulmonary:     Effort: Pulmonary effort is normal.     Breath sounds: Normal breath sounds.  Abdominal:     General: Bowel sounds are normal.     Palpations: Abdomen is soft.     Tenderness: There is no abdominal tenderness.  Musculoskeletal:        General: Normal range of motion.     Cervical back: Normal range of motion and neck supple.  Lymphadenopathy:     Cervical: No cervical adenopathy.  Skin:    General: Skin is warm and dry.     Capillary Refill: Capillary refill takes less than 2 seconds.  Neurological:     General: No focal deficit present.     Mental Status: She  is alert and oriented to person, place, and time.     Cranial Nerves: No cranial nerve deficit.     Sensory: No sensory deficit.     Motor: No weakness.     Coordination: Coordination normal.  Psychiatric:        Mood and Affect: Mood normal.        Behavior: Behavior normal.        Thought Content: Thought content normal.        Judgment: Judgment normal.    Today's Vitals   01/21/21 1600  BP: 137/84  Pulse: (!) 105  Temp: 98.5 F (36.9 C)  SpO2: 96%  Weight: 190 lb 1.6 oz (86.2 kg)  Height: 5' 4"  (1.626 m)   Body mass index is 32.63 kg/m.   Wt Readings from Last 3 Encounters:  01/27/21 187 lb (84.8 kg)  01/21/21 190 lb 1.6 oz (86.2 kg)  01/12/21 180 lb (81.6 kg)     Health Maintenance Due  Topic Date Due   Pneumococcal Vaccine 27-20 Years old (1 - PCV) Never done   Hepatitis C Screening  Never done   Zoster Vaccines- Shingrix (1 of 2) Never done   COLONOSCOPY (Pts 45-48yr Insurance coverage will need to be confirmed)  Never done   COVID-19 Vaccine (3 - Moderna risk series) 11/13/2019   MAMMOGRAM  Never done    There are no preventive care reminders to display for this patient.  Lab Results  Component Value Date   TSH 1.13 08/13/2020   Lab Results  Component Value Date   WBC 10.1 12/14/2020   HGB 11.8 (L) 12/15/2020   HCT 34.6 (L) 12/15/2020   MCV 92.9 12/14/2020   PLT 223 12/14/2020   Lab Results  Component Value Date   NA 138 12/15/2020   K 4.0 12/15/2020   CO2 24 12/15/2020   GLUCOSE 128 (H)  12/15/2020   BUN 12 12/15/2020   CREATININE 0.74 12/15/2020   BILITOT 1.9 (H) 10/06/2020   ALKPHOS 68 10/06/2020   AST 38 10/06/2020   ALT 34 10/06/2020   PROT 6.8 10/06/2020   ALBUMIN 3.8 10/06/2020   CALCIUM 8.0 (L) 12/15/2020   ANIONGAP 7 12/15/2020   GFR 72.11 08/13/2019   Lab Results  Component Value Date   CHOL 244 (H) 04/30/2018   Lab Results  Component Value Date   HDL 86.70 04/30/2018   Lab Results  Component Value Date   LDLCALC 144  (H) 04/30/2018   Lab Results  Component Value Date   TRIG 65.0 04/30/2018   Lab Results  Component Value Date   CHOLHDL 3 04/30/2018   Lab Results  Component Value Date   HGBA1C 6.1 04/30/2018      Assessment & Plan:  1. Encounter for general adult medical examination with abnormal findings Annual wellness visit today.  2. Multiple renal calculi History of recent surgical removal of renal calculi.  Patient continues to see urology on routine basis.  3. Osteoarthritis of facet joint of lumbar spine May take meloxicam 15 mg tablets daily as needed for pain and inflammation.  Continue to see pain management provider as scheduled. - meloxicam (MOBIC) 15 MG tablet; TAKE 1 TABLET BY MOUTH DAILY  Dispense: 90 tablet; Refill: 0  4. Chronic midline low back pain with bilateral sciatica May take tizanidine 4 mg tablets up to 3 times a day as needed for muscle pain and spasms.  He should continue to see pain management provider as scheduled. - tiZANidine (ZANAFLEX) 4 MG tablet; Take 1 tablet (4 mg total) by mouth every 8 (eight) hours as needed for muscle spasms.  Dispense: 90 tablet; Refill: 2   Problem List Items Addressed This Visit       Nervous and Auditory   Chronic midline low back pain with bilateral sciatica (Chronic)   Relevant Medications   meloxicam (MOBIC) 15 MG tablet   tiZANidine (ZANAFLEX) 4 MG tablet     Musculoskeletal and Integument   Osteoarthritis of facet joint of lumbar spine (Chronic)   Relevant Medications   meloxicam (MOBIC) 15 MG tablet   tiZANidine (ZANAFLEX) 4 MG tablet     Genitourinary   Multiple renal calculi     Other   Encounter for general adult medical examination with abnormal findings - Primary    Meds ordered this encounter  Medications   meloxicam (MOBIC) 15 MG tablet    Sig: TAKE 1 TABLET BY MOUTH DAILY    Dispense:  90 tablet    Refill:  0    Order Specific Question:   Supervising Provider    Answer:   Beatrice Lecher D  [[id number]]   tiZANidine (ZANAFLEX) 4 MG tablet    Sig: Take 1 tablet (4 mg total) by mouth every 8 (eight) hours as needed for muscle spasms.    Dispense:  90 tablet    Refill:  2    Order Specific Question:   Supervising Provider    Answer:   Beatrice Lecher D [[id number]]    Follow-up: Return in about 3 months (around 04/23/2021) for OA - FBW with Free T4 a week prior to visit .    [name], NP

## 2021-01-21 NOTE — Progress Notes (Signed)
65mm stone right kidney. Staghorn stone on left side no longer visible. Discuss with patient at visit today 01/21/2021

## 2021-01-26 NOTE — Progress Notes (Signed)
PROVIDER NOTE: Information contained herein reflects review and annotations entered in association with encounter. Interpretation of such information and data should be left to medically-trained personnel. Information provided to patient can be located elsewhere in the medical record under "Patient Instructions". Document created using STT-dictation technology, any transcriptional errors that may result from process are unintentional.    Patient: Denise Burns  Service Category: E/M  Provider: [name], MD  DOB: [birth year]  DOS: 01/27/2021  Specialty: Interventional Pain Management  MRN: [id number]  Setting: Ambulatory outpatient  PCP: [name], NP  Type: Established Patient    Referring Provider: [name], NP  Location: Office  Delivery: Face-to-face     HPI  [name], a 50 y.o. year old female, is here today because of her Chronic hip pain, bilateral [M25.551, M25.552, G89.29]. [name]'s primary complain today is Back Pain (low) and Leg Pain (Side of both thighs) Last encounter: My last encounter with her was on 12/09/2020. Pertinent problems: [name] has Chronic midline low back pain with bilateral sciatica; Chronic low back pain (1ry area of Pain) (Bilateral) (R>L) w/o sciatica; Chronic sacroiliac joint pain (2ry area of Pain) (Right); Chronic pain syndrome; Abnormal MRI, lumbar spine (03/22/2018); Lumbar facet hypertrophy (Multilevel) (Bilateral); Lumbar facet syndrome (Bilateral); DDD (degenerative disc disease), lumbosacral; Spondylolisthesis (8 mm) at L5-S1 level; Lumbar Grade 1 Retrolisthesis (2 mm) of L2/L3; L5-S1 pars defect w/ spondylolisthesis (Bilateral); Lumbar pars defect (L5-S1) (Bilateral); Lumbar foraminal stenosis (Left: L2-3) (Bilateral: L5-S1); Osteoarthritis of sacroiliac joint (Bilateral); Chronic musculoskeletal pain; Neurogenic pain; Osteoarthritis of facet joint of lumbar spine; Greater trochanteric bursitis (Bilateral);  Lumbar spondylosis; Spondylosis without myelopathy or radiculopathy, lumbosacral region; Chronic hip pain (Right); Osteoarthritis of hip (Right); Lumbar radiculitis (L1/L2) (Right); Chronic groin pain (Right); Chronic hip pain (Bilateral); Enthesopathy hip region (Left); Primary osteoarthritis of both hips; Kissing spine syndrome; and Chronic low back pain (Midline) w/o sciatica on their pertinent problem list. Pain Assessment: Severity of Chronic pain is reported as a 4 /10. Location: Back Lower/radiates into buttocks. Onset: More than a month ago. Quality: Aching. Timing: Constant. Modifying factor(s): lying down meds. Vitals:  height is 5' 4"  (1.626 m) and weight is 187 lb (84.8 kg). Her temporal temperature is 97.1 F (36.2 C) (abnormal). Her blood pressure is 154/91 (abnormal) and her pulse is 86. Her respiration is 16 and oxygen saturation is 100%.   Reason for encounter: medication management.   The patient indicates doing well with the current medication regimen. No adverse reactions or side effects reported to the medications.  UDS updated today.  Today the patient indicates having pain in both hips areas.  Provocative Patrick maneuver was negative for SI joint or hip joint problems.  Palpation over the trochanteric area was positive bilaterally for exact reproduction of the patient's pain in the lateral aspect of the upper leg.  The patient indicates wanting to have her bursa injections done.  We will go ahead and schedule this as soon as possible.  RTCB: 05/06/2021 Nonopioids transferred 05/11/2020: Baclofen and Mobic  Pharmacotherapy Assessment  Analgesic: Hydrocodone/APAP 7.5/325 1 tablet p.o. 3 times daily (22.5 mg/day of hydrocodone)  MME/day: 22.5 mg/day.   Monitoring: [place] PMP: PDMP reviewed during this encounter.       Pharmacotherapy: No side-effects or adverse reactions reported. Compliance: No problems identified. Effectiveness: Clinically acceptable.  Dewayne Shorter, RN   01/27/2021 10:41 AM  Sign when Signing Visit Nursing Pain Medication Assessment:  Safety precautions to be maintained  throughout the outpatient stay will include: orient to surroundings, keep bed in low position, maintain call bell within reach at all times, provide assistance with transfer out of bed and ambulation.  Medication Inspection Compliance: Pill count conducted under aseptic conditions, in front of the patient. Neither the pills nor the bottle was removed from the patient's sight at any time. Once count was completed pills were immediately returned to the patient in their original bottle.  Medication: Hydrocodone/APAP Pill/Patch Count:  25.5 of 90 pills remain Pill/Patch Appearance: Markings consistent with prescribed medication Bottle Appearance: Standard pharmacy container. Clearly labeled. Filled Date: 06 / 08 / 2022 Last Medication intake:  Today    UDS:  Summary  Date Value Ref Range Status  02/11/2020 Note  Final    Comment:    ==================================================================== [employer] Select 13 (MW) ==================================================================== Test                             Result       Flag       Units  Drug Present not Declared for Prescription Verification   Alprazolam                     23           UNEXPECTED ng/mg creat   Alpha-hydroxyalprazolam        16           UNEXPECTED ng/mg creat    Source of alprazolam is a scheduled prescription medication. Alpha-    hydroxyalprazolam is an expected metabolite of alprazolam.    Oxycodone                      2136         UNEXPECTED ng/mg creat   Oxymorphone                    148          UNEXPECTED ng/mg creat   Noroxycodone                   5926         UNEXPECTED ng/mg creat   Noroxymorphone                 324          UNEXPECTED ng/mg creat    Sources of oxycodone are scheduled prescription medications.    Oxymorphone, noroxycodone, and noroxymorphone are expected     metabolites of oxycodone. Oxymorphone is also available as a    scheduled prescription medication.    Butalbital                     PRESENT      UNEXPECTED  Drug Absent but Declared for Prescription Verification   Hydrocodone                    Not Detected UNEXPECTED ng/mg creat ==================================================================== Test                      Result    Flag   Units      Ref Range   Creatinine              146              mg/dL      >=20 ==================================================================== Declared Medications:  The flagging  and interpretation on this report are based on the  following declared medications.  Unexpected results may arise from  inaccuracies in the declared medications.   **Note: The testing scope of this panel includes these medications:   Hydrocodone   **Note: The testing scope of this panel does not include the  following reported medications:   Acetaminophen  Baclofen  Cetirizine  Fludrocortisone  Hydrocortisone  Meloxicam  Pseudoephedrine (Sudafed) ==================================================================== For clinical consultation, please call [phone]. ====================================================================      ROS  Constitutional: Denies any fever or chills Gastrointestinal: No reported hemesis, hematochezia, vomiting, or acute GI distress Musculoskeletal: Denies any acute onset joint swelling, redness, loss of ROM, or weakness Neurological: No reported episodes of acute onset apraxia, aphasia, dysarthria, agnosia, amnesia, paralysis, loss of coordination, or loss of consciousness  Medication Review  HYDROcodone-acetaminophen, PEG-KCl-NaCl-NaSulf-Na Asc-C, azelastine, fludrocortisone, fluticasone, hydrocortisone, meloxicam, promethazine, pseudoephedrine, and tiZANidine  History Review  Allergy: [name] is allergic to lisinopril, sulfacetamide sodium (acne), tramadol,  and sulfa antibiotics. Drug: [name]  reports no history of drug use. Alcohol:  reports previous alcohol use. Tobacco:  reports that she quit smoking about 26 years ago. Her smoking use included cigarettes. She has never used smokeless tobacco. Social: [name]  reports that she quit smoking about 26 years ago. Her smoking use included cigarettes. She has never used smokeless tobacco. She reports previous alcohol use. She reports that she does not use drugs. Medical:  has a past medical history of Addison disease ([place]), Arthritis, Back pain, Complication of anesthesia, and GERD (gastroesophageal reflux disease). Surgical: [name]  has a past surgical history that includes Cholecystectomy (2010); Polypectomy; dental work; Dilation and curettage of uterus (2010); IR NEPHROSTOMY PLACEMENT LEFT (12/14/2020); Nephrolithotomy (Left, 12/14/2020); Wisdom tooth extraction; and Diagnostic laparoscopy (2004). Family: family history includes Alcohol abuse in her father; Arthritis in her mother; Colon polyps (age of onset: 52) in her maternal grandmother; Hearing loss in her father; High blood pressure in her father and mother; Hyperlipidemia in her father and mother; Hypertension in her mother.  Laboratory Chemistry Profile   Renal Lab Results  Component Value Date   BUN 12 12/15/2020   CREATININE 0.74 32/44/0102   BCR NOT APPLICABLE 72/53/6644   GFR 72.11 08/13/2019   GFRAA 86 08/16/2018   GFRNONAA >60 12/15/2020    Hepatic Lab Results  Component Value Date   AST 38 10/06/2020   ALT 34 10/06/2020   ALBUMIN 3.8 10/06/2020   ALKPHOS 68 10/06/2020   LIPASE 30 10/06/2020    Electrolytes Lab Results  Component Value Date   NA 138 12/15/2020   K 4.0 12/15/2020   CL 107 12/15/2020   CALCIUM 8.0 (L) 12/15/2020   MG 2.1 08/16/2018    Bone Lab Results  Component Value Date   VD25OH 13.55 (L) 04/30/2018   25OHVITD1 67 08/16/2018   25OHVITD2 <1.0 08/16/2018   25OHVITD3 67 08/16/2018     Inflammation (CRP: Acute Phase) (ESR: Chronic Phase) Lab Results  Component Value Date   CRP 2 08/16/2018   ESRSEDRATE 37 (H) 08/16/2018   LATICACIDVEN 1.4 10/06/2020          Note: Above Lab results reviewed.  Recent Imaging Review  US RENAL CLINICAL DATA:  Left staghorn calculus follow-up.  EXAM: RENAL / URINARY TRACT ULTRASOUND COMPLETE  COMPARISON:  CT scan October 06, 2020  FINDINGS: Right Kidney:  Renal measurements: 12.7 x 5.5 x 5.2 cm = volume: 190 mL. There is a nonobstructive shadowing stone measuring  up to 1.3 cm in the lower pole of the right kidney. No hydronephrosis.  Left Kidney:  Renal measurements: 12.7 x 5.7 x 5.3 cm = volume: 201 mL. The previously identified left staghorn calculus is not visualized.  Bladder:  Appears normal for degree of bladder distention.  Other:  None.  IMPRESSION: 1. 13 mm nonobstructive stone in the lower right kidney. 2. The previously identified left staghorn calculus is not visualized. 3. No hydronephrosis bilaterally. 4. No other abnormalities.  Electronically Signed   By: Dorise Bullion III M.D   On: 01/21/2021 07:18 Note: Reviewed        Physical Exam  General appearance: Well nourished, well developed, and well hydrated. In no apparent acute distress Mental status: Alert, oriented x 3 (person, place, & time)       Respiratory: No evidence of acute respiratory distress Eyes: PERLA Vitals: BP (!) 154/91 (BP Location: Right Arm, Patient Position: Sitting, Cuff Size: Large)   Pulse 86   Temp (!) 97.1 F (36.2 C) (Temporal)   Resp 16   Ht 5' 4"  (1.626 m)   Wt 187 lb (84.8 kg)   LMP 12/01/2020 (Approximate)   SpO2 100%   BMI 32.10 kg/m  BMI: Estimated body mass index is 32.1 kg/m as calculated from the following:   Height as of this encounter: 5' 4"  (1.626 m).   Weight as of this encounter: 187 lb (84.8 kg). Ideal: Ideal body weight: 54.7 kg (120 lb 9.5 oz) Adjusted ideal body weight: 66.7 kg (147  lb 2.5 oz)  Assessment   Status Diagnosis  Controlled Controlled Controlled 1. Chronic hip pain (Bilateral)   2. Greater trochanteric bursitis (Bilateral)   3. Chronic low back pain (1ry area of Pain) (Bilateral) (R>L) w/o sciatica   4. Chronic pain syndrome   5. Chronic sacroiliac joint pain (2ry area of Pain) (Right)   6. Pharmacologic therapy   7. Chronic use of opiate for therapeutic purpose   8. Encounter for medication management      Updated Problems: Problem  Left Nephrolithiasis  Abnormal Kidney Function  Kidney Stones  Abnormal Uterine Bleeding  Urinary Tract Infection With Hematuria  Nausea  Dysuria  Postmenopausal Bleeding  Encounter to Establish Care  Iron Deficiency Anemia  Encounter for Screening Mammogram for Malignant Neoplasm of Breast  Screening for Colon Cancer  Sepsis (Hcc)  Pyelonephritis    Plan of Care  Problem-specific:  No problem-specific Assessment & Plan notes found for this encounter.  [name] has a current medication list which includes the following long-term medication(s): azelastine, azelastine, flonase sensimist, meloxicam, promethazine, [START ON 02/05/2021] hydrocodone-acetaminophen, [START ON 03/07/2021] hydrocodone-acetaminophen, and [START ON 04/06/2021] hydrocodone-acetaminophen.  Pharmacotherapy (Medications Ordered): Meds ordered this encounter  Medications   HYDROcodone-acetaminophen (NORCO) 7.5-325 MG tablet    Sig: Take 1 tablet by mouth every 8 (eight) hours as needed for severe pain. Must last 30 days.    Dispense:  90 tablet    Refill:  0    Not a duplicate. Do NOT delete! Dispense 1 day early if closed on fill date. Warn not to take CNS-depressants 8 hours before or after taking opioid. Do not send refill request. Renewal requires appointment.   HYDROcodone-acetaminophen (NORCO) 7.5-325 MG tablet    Sig: Take 1 tablet by mouth every 8 (eight) hours as needed for severe pain. Must last 30 days.    Dispense:   90 tablet    Refill:  0    Not a duplicate.  Do NOT delete! Dispense 1 day early if closed on fill date. Warn not to take CNS-depressants 8 hours before or after taking opioid. Do not send refill request. Renewal requires appointment.   HYDROcodone-acetaminophen (NORCO) 7.5-325 MG tablet    Sig: Take 1 tablet by mouth every 8 (eight) hours as needed for severe pain. Must last 30 days.    Dispense:  90 tablet    Refill:  0    Not a duplicate. Do NOT delete! Dispense 1 day early if closed on fill date. Warn not to take CNS-depressants 8 hours before or after taking opioid. Do not send refill request. Renewal requires appointment.    Orders:  Orders Placed This Encounter  Procedures   HIP INJECTION    Standing Status:   Future    Standing Expiration Date:   04/29/2021    Scheduling Instructions:     Purpose: Therapeutic/Diagnostic     Indication: Hip pain 2ry to Trochanteric Burlitis unspecified side (M70.60).     Side: Bilateral     Sedation: Patient's choice.     Timeframe: As soon as the schedule permits.   [employer]ox[employer] Select 13 (MW), Urine    Volume: 30 ml(s). Minimum 3 ml of urine is needed. Document temperature of fresh sample. Indications: Long term (current) use of opiate analgesic (D17.616)    Order Specific Question:   Release to patient    Answer:   Immediate   Nursing Instructions:    1). STAT: UDS required today. 2). Make sure to document all opioids and benzodiazepines taken, including time of last intake. 3). If order is entered on a procedure day, make sure sample is obtained before any medications are administered.    Follow-up plan:   Return for Procedure (no sedation): (B) Trochanteric Bursa inj. #1.      Interventional Therapies  Risk  Complexity Considerations:   Estimated body mass index is 32.1 kg/m as calculated from the following:   Height as of this encounter: 5' 4"  (1.626 m).   Weight as of this encounter: 187 lb (84.8 kg). WNL   Planned  Pending:    Diagnostic/therapeutic bilateral trochanteric bursa injection #1    Under consideration:   Diagnostic L2-3 L-FCT BLK #1  Diagnostic bilateral SI joint Blk  Possible bilateral SI joint RFA  Diagnostic bilateral L5 TFESI  Diagnostic left-sided L2 TFESI    Completed:   Therapeutic bilateral lumbar facet block x3 (09/08/2020) (4-0) (100/100/60/60)  Therapeutic right lumbar facet RFA x1 (01/07/2020) (4-0) (100/100/90/>75)  Therapeutic left lumbar facet RFA x1 (02/04/2020) (4-0) (100/100/90/>75)  Therapeutic right hip joint injection x2 (08/20/2020) (5-1) (100/100/80/80)  Therapeutic left hip joint injection x1 (08/20/2020) (5-1) (100/100/80/80)    Therapeutic  Palliative (PRN) options:   Palliative bilateral lumbar facet block #4  Palliative right lumbar facet RFA #2 (last done 01/07/2020) Palliative left lumbar facet RFA #2 (last done 02/04/2020)    Recent Visits Date Type Provider Dept  11/02/20 Office Visit Milinda Pointer, MD [facility]  Showing recent visits within past 90 days and meeting all other requirements Today's Visits Date Type Provider Dept  01/27/21 Office Visit Milinda Pointer, MD [facility]  Showing today's visits and meeting all other requirements Future Appointments Date Type Provider Dept  02/16/21 Appointment Milinda Pointer, MD [facility]  04/21/21 Appointment Milinda Pointer, MD [facility]  Showing future appointments within next 90 days and meeting all other requirements I discussed the assessment and treatment plan with the patient. The  patient was provided an opportunity to ask questions and all were answered. The patient agreed with the plan and demonstrated an understanding of the instructions.  Patient advised to call back or seek an in-person evaluation if the symptoms or condition worsens.  Duration of encounter: 30 minutes.  Note by: [name], MD Date: 01/27/2021; Time: 11:06 AM

## 2021-01-27 ENCOUNTER — Encounter: Payer: Self-pay | Admitting: Pain Medicine

## 2021-01-27 ENCOUNTER — Encounter: Payer: BC Managed Care – PPO | Admitting: Gastroenterology

## 2021-01-27 ENCOUNTER — Ambulatory Visit: Payer: BC Managed Care – PPO | Attending: Pain Medicine | Admitting: Pain Medicine

## 2021-01-27 ENCOUNTER — Other Ambulatory Visit: Payer: Self-pay

## 2021-01-27 VITALS — BP 154/91 | HR 86 | Temp 97.1°F | Resp 16 | Ht 64.0 in | Wt 187.0 lb

## 2021-01-27 DIAGNOSIS — G8929 Other chronic pain: Secondary | ICD-10-CM | POA: Diagnosis not present

## 2021-01-27 DIAGNOSIS — M7062 Trochanteric bursitis, left hip: Secondary | ICD-10-CM | POA: Diagnosis not present

## 2021-01-27 DIAGNOSIS — G894 Chronic pain syndrome: Secondary | ICD-10-CM

## 2021-01-27 DIAGNOSIS — Z79899 Other long term (current) drug therapy: Secondary | ICD-10-CM | POA: Diagnosis not present

## 2021-01-27 DIAGNOSIS — M25552 Pain in left hip: Secondary | ICD-10-CM | POA: Insufficient documentation

## 2021-01-27 DIAGNOSIS — M545 Low back pain, unspecified: Secondary | ICD-10-CM | POA: Insufficient documentation

## 2021-01-27 DIAGNOSIS — Z79891 Long term (current) use of opiate analgesic: Secondary | ICD-10-CM | POA: Diagnosis not present

## 2021-01-27 DIAGNOSIS — M7061 Trochanteric bursitis, right hip: Secondary | ICD-10-CM | POA: Diagnosis not present

## 2021-01-27 DIAGNOSIS — N939 Abnormal uterine and vaginal bleeding, unspecified: Secondary | ICD-10-CM | POA: Insufficient documentation

## 2021-01-27 DIAGNOSIS — M533 Sacrococcygeal disorders, not elsewhere classified: Secondary | ICD-10-CM

## 2021-01-27 DIAGNOSIS — M25551 Pain in right hip: Secondary | ICD-10-CM | POA: Insufficient documentation

## 2021-01-27 MED ORDER — HYDROCODONE-ACETAMINOPHEN 7.5-325 MG PO TABS
1.0000 | ORAL_TABLET | Freq: Three times a day (TID) | ORAL | 0 refills | Status: DC | PRN
  Filled 2021-03-08: qty 90, 30d supply, fill #0

## 2021-01-27 MED ORDER — HYDROCODONE-ACETAMINOPHEN 7.5-325 MG PO TABS
1.0000 | ORAL_TABLET | Freq: Three times a day (TID) | ORAL | 0 refills | Status: DC | PRN
  Filled 2021-02-05: qty 90, 30d supply, fill #0

## 2021-01-27 MED ORDER — HYDROCODONE-ACETAMINOPHEN 7.5-325 MG PO TABS
1.0000 | ORAL_TABLET | Freq: Three times a day (TID) | ORAL | 0 refills | Status: DC | PRN
  Filled 2021-04-06 (×2): qty 90, 30d supply, fill #0

## 2021-01-27 NOTE — Patient Instructions (Signed)
____________________________________________________________________________________________  Preparing for your procedure (without sedation)  Procedure appointments are limited to planned procedures: No Prescription Refills. No disability issues will be discussed. No medication changes will be discussed.  Instructions: Oral Intake: Do not eat or drink anything for at least 6 hours prior to your procedure. (Exception: Blood Pressure Medication. See below.) Transportation: Unless otherwise stated by your physician, you may drive yourself after the procedure. Blood Pressure Medicine: Do not forget to take your blood pressure medicine with a sip of water the morning of the procedure. If your Diastolic (lower reading)is above 100 mmHg, elective cases will be cancelled/rescheduled. Blood thinners: These will need to be stopped for procedures. Notify our staff if you are taking any blood thinners. Depending on which one you take, there will be specific instructions on how and when to stop it. Diabetics on insulin: Notify the staff so that you can be scheduled 1st case in the morning. If your diabetes requires high dose insulin, take only  of your normal insulin dose the morning of the procedure and notify the staff that you have done so. Preventing infections: Shower with an antibacterial soap the morning of your procedure.  Build-up your immune system: Take 1000 mg of Vitamin C with every meal (3 times a day) the day prior to your procedure. Antibiotics: Inform the staff if you have a condition or reason that requires you to take antibiotics before dental procedures. Pregnancy: If you are pregnant, call and cancel the procedure. Sickness: If you have a cold, fever, or any active infections, call and cancel the procedure. Arrival: You must be in the facility at least 30 minutes prior to your scheduled procedure. Children: Do not bring any children with you. Dress appropriately: Bring dark clothing  that you would not mind if they get stained. Valuables: Do not bring any jewelry or valuables.  Reasons to call and reschedule or cancel your procedure: (Following these recommendations will minimize the risk of a serious complication.) Surgeries: Avoid having procedures within 2 weeks of any surgery. (Avoid for 2 weeks before or after any surgery). Flu Shots: Avoid having procedures within 2 weeks of a flu shots or . (Avoid for 2 weeks before or after immunizations). Barium: Avoid having a procedure within 7-10 days after having had a radiological study involving the use of radiological contrast. (Myelograms, Barium swallow or enema study). Heart attacks: Avoid any elective procedures or surgeries for the initial 6 months after a "Myocardial Infarction" (Heart Attack). Blood thinners: It is imperative that you stop these medications before procedures. Let us know if you if you take any blood thinner.  Infection: Avoid procedures during or within two weeks of an infection (including chest colds or gastrointestinal problems). Symptoms associated with infections include: Localized redness, fever, chills, night sweats or profuse sweating, burning sensation when voiding, cough, congestion, stuffiness, runny nose, sore throat, diarrhea, nausea, vomiting, cold or Flu symptoms, recent or current infections. It is specially important if the infection is over the area that we intend to treat. Heart and lung problems: Symptoms that may suggest an active cardiopulmonary problem include: cough, chest pain, breathing difficulties or shortness of breath, dizziness, ankle swelling, uncontrolled high or unusually low blood pressure, and/or palpitations. If you are experiencing any of these symptoms, cancel your procedure and contact your primary care physician for an evaluation.  Remember:  Regular Business hours are:  Monday to Thursday 8:00 AM to 4:00 PM  Provider's Schedule: Milinda Pointer, MD:  Procedure  days: Tuesday and  Thursday 7:30 AM to 4:00 PM  Bilal Lateef, MD:  Procedure days: Monday and Wednesday 7:30 AM to 4:00 PM ____________________________________________________________________________________________  ____________________________________________________________________________________________  General Risks and Possible Complications  Patient Responsibilities: It is important that you read this as it is part of your informed consent. It is our duty to inform you of the risks and possible complications associated with treatments offered to you. It is your responsibility as a patient to read this and to ask questions about anything that is not clear or that you believe was not covered in this document.  Patient's Rights: You have the right to refuse treatment. You also have the right to change your mind, even after initially having agreed to have the treatment done. However, under this last option, if you wait until the last second to change your mind, you may be charged for the materials used up to that point.  Introduction: Medicine is not an exact science. Everything in Medicine, including the lack of treatment(s), carries the potential for danger, harm, or loss (which is by definition: Risk). In Medicine, a complication is a secondary problem, condition, or disease that can aggravate an already existing one. All treatments carry the risk of possible complications. The fact that a side effects or complications occurs, does not imply that the treatment was conducted incorrectly. It must be clearly understood that these can happen even when everything is done following the highest safety standards.  No treatment: You can choose not to proceed with the proposed treatment alternative. The "PRO(s)" would include: avoiding the risk of complications associated with the therapy. The "CON(s)" would include: not getting any of the treatment benefits. These benefits fall under one of three  categories: diagnostic; therapeutic; and/or palliative. Diagnostic benefits include: getting information which can ultimately lead to improvement of the disease or symptom(s). Therapeutic benefits are those associated with the successful treatment of the disease. Finally, palliative benefits are those related to the decrease of the primary symptoms, without necessarily curing the condition (example: decreasing the pain from a flare-up of a chronic condition, such as incurable terminal cancer).  General Risks and Complications: These are associated to most interventional treatments. They can occur alone, or in combination. They fall under one of the following six (6) categories: no benefit or worsening of symptoms; bleeding; infection; nerve damage; allergic reactions; and/or death. No benefits or worsening of symptoms: In Medicine there are no guarantees, only probabilities. No healthcare provider can ever guarantee that a medical treatment will work, they can only state the probability that it may. Furthermore, there is always the possibility that the condition may worsen, either directly, or indirectly, as a consequence of the treatment. Bleeding: This is more common if the patient is taking a blood thinner, either prescription or over the counter (example: Goody Powders, Fish oil, Aspirin, Garlic, etc.), or if suffering a condition associated with impaired coagulation (example: Hemophilia, cirrhosis of the liver, low platelet counts, etc.). However, even if you do not have one on these, it can still happen. If you have any of these conditions, or take one of these drugs, make sure to notify your treating physician. Infection: This is more common in patients with a compromised immune system, either due to disease (example: diabetes, cancer, human immunodeficiency virus [HIV], etc.), or due to medications or treatments (example: therapies used to treat cancer and rheumatological diseases). However, even if you  do not have one on these, it can still happen. If you have any of these conditions, or   take one of these drugs, make sure to notify your treating physician. Nerve Damage: This is more common when the treatment is an invasive one, but it can also happen with the use of medications, such as those used in the treatment of cancer. The damage can occur to small secondary nerves, or to large primary ones, such as those in the spinal cord and brain. This damage may be temporary or permanent and it may lead to impairments that can range from temporary numbness to permanent paralysis and/or brain death. Allergic Reactions: Any time a substance or material comes in contact with our body, there is the possibility of an allergic reaction. These can range from a mild skin rash (contact dermatitis) to a severe systemic reaction (anaphylactic reaction), which can result in death. Death: In general, any medical intervention can result in death, most of the time due to an unforeseen complication. ____________________________________________________________________________________________ ____________________________________________________________________________________________  Medication Rules  Purpose: To inform patients, and their family members, of our rules and regulations.  Applies to: All patients receiving prescriptions (written or electronic).  Pharmacy of record: Pharmacy where electronic prescriptions will be sent. If written prescriptions are taken to a different pharmacy, please inform the nursing staff. The pharmacy listed in the electronic medical record should be the one where you would like electronic prescriptions to be sent.  Electronic prescriptions: In compliance with the [place] (STOP) Act of 2017 (Session Lanny Cramp [phone]), effective August 01, 2018, all controlled substances must be electronically prescribed. Calling prescriptions to the pharmacy will  cease to exist.  Prescription refills: Only during scheduled appointments. Applies to all prescriptions.  NOTE: The following applies primarily to controlled substances (Opioid* Pain Medications).   Type of encounter (visit): For patients receiving controlled substances, face-to-face visits are required. (Not an option or up to the patient.)  Patient's responsibilities: Pain Pills: Bring all pain pills to every appointment (except for procedure appointments). Pill Bottles: Bring pills in original pharmacy bottle. Always bring the newest bottle. Bring bottle, even if empty. Medication refills: You are responsible for knowing and keeping track of what medications you take and those you need refilled. The day before your appointment: write a list of all prescriptions that need to be refilled. The day of the appointment: give the list to the admitting nurse. Prescriptions will be written only during appointments. No prescriptions will be written on procedure days. If you forget a medication: it will not be "Called in", "Faxed", or "electronically sent". You will need to get another appointment to get these prescribed. No early refills. Do not call asking to have your prescription filled early. Prescription Accuracy: You are responsible for carefully inspecting your prescriptions before leaving our office. Have the discharge nurse carefully go over each prescription with you, before taking them home. Make sure that your name is accurately spelled, that your address is correct. Check the name and dose of your medication to make sure it is accurate. Check the number of pills, and the written instructions to make sure they are clear and accurate. Make sure that you are given enough medication to last until your next medication refill appointment. Taking Medication: Take medication as prescribed. When it comes to controlled substances, taking less pills or less frequently than prescribed is permitted and  encouraged. Never take more pills than instructed. Never take medication more frequently than prescribed.  Inform other Doctors: Always inform, all of your healthcare providers, of all the medications you take. Pain Medication from other  Providers: You are not allowed to accept any additional pain medication from any other Doctor or Healthcare provider. There are two exceptions to this rule. (see below) In the event that you require additional pain medication, you are responsible for notifying us, as stated below. Cough Medicine: Often these contain an opioid, such as codeine or hydrocodone. Never accept or take cough medicine containing these opioids if you are already taking an opioid* medication. The combination may cause respiratory failure and death. Medication Agreement: You are responsible for carefully reading and following our Medication Agreement. This must be signed before receiving any prescriptions from our practice. Safely store a copy of your signed Agreement. Violations to the Agreement will result in no further prescriptions. (Additional copies of our Medication Agreement are available upon request.) Laws, Rules, & Regulations: All patients are expected to follow all Federal and [employer], [employer], Rules, [employer]. Ignorance of the Laws does not constitute a valid excuse.  Illegal drugs and Controlled Substances: The use of illegal substances (including, but not limited to marijuana and its derivatives) and/or the illegal use of any controlled substances is strictly prohibited. Violation of this rule may result in the immediate and permanent discontinuation of any and all prescriptions being written by our practice. The use of any illegal substances is prohibited. Adopted CDC guidelines & recommendations: Target dosing levels will be at or below 60 MME/day. Use of benzodiazepines** is not recommended.  Exceptions: There are only two exceptions to the rule of not receiving pain  medications from other Healthcare Providers. Exception #1 (Emergencies): In the event of an emergency (i.e.: accident requiring emergency care), you are allowed to receive additional pain medication. However, you are responsible for: As soon as you are able, call our office [phone], at any time of the day or night, and leave a message stating your name, the date and nature of the emergency, and the name and dose of the medication prescribed. In the event that your call is answered by a member of our staff, make sure to document and save the date, time, and the name of the person that took your information.  Exception #2 (Planned Surgery): In the event that you are scheduled by another doctor or dentist to have any type of surgery or procedure, you are allowed (for a period no longer than 30 days), to receive additional pain medication, for the acute post-op pain. However, in this case, you are responsible for picking up a copy of our "Post-op Pain Management for Surgeons" handout, and giving it to your surgeon or dentist. This document is available at our office, and does not require an appointment to obtain it. Simply go to our office during business hours (Monday-Thursday from 8:00 AM to 4:00 PM) (Friday 8:00 AM to 12:00 Noon) or if you have a scheduled appointment with [place], prior to your surgery, and ask for it by name. In addition, you are responsible for: calling our office [phone], at any time of the day or night, and leaving a message stating your name, name of your surgeon, type of surgery, and date of procedure or surgery. Failure to comply with your responsibilities may result in termination of therapy involving the controlled substances.  *Opioid medications include: morphine, codeine, oxycodone, oxymorphone, hydrocodone, hydromorphone, meperidine, tramadol, tapentadol, buprenorphine, fentanyl, methadone. **Benzodiazepine medications include: diazepam (Valium), alprazolam (Xanax),  clonazepam (Klonopine), lorazepam (Ativan), clorazepate (Tranxene), chlordiazepoxide (Librium), estazolam (Prosom), oxazepam (Serax), temazepam (Restoril), triazolam (Halcion) (Last updated: 06/29/2020) ____________________________________________________________________________________________  ____________________________________________________________________________________________  Medication  Recommendations and Reminders  Applies to: All patients receiving prescriptions (written and/or electronic).  Medication Rules & Regulations: These rules and regulations exist for your safety and that of others. They are not flexible and neither are we. Dismissing or ignoring them will be considered "non-compliance" with medication therapy, resulting in complete and irreversible termination of such therapy. (See document titled "Medication Rules" for more details.) In all conscience, because of safety reasons, we cannot continue providing a therapy where the patient does not follow instructions.  Pharmacy of record:  Definition: This is the pharmacy where your electronic prescriptions will be sent.  We do not endorse any particular pharmacy, however, we have experienced problems with [facility] not securing enough medication supply for the community. We do not restrict you in your choice of pharmacy. However, once we write for your prescriptions, we will NOT be re-sending more prescriptions to fix restricted supply problems created by your pharmacy, or your insurance.  The pharmacy listed in the electronic medical record should be the one where you want electronic prescriptions to be sent. If you choose to change pharmacy, simply notify our nursing staff.  Recommendations: Keep all of your pain medications in a safe place, under lock and key, even if you live alone. We will NOT replace lost, stolen, or damaged medication. After you fill your prescription, take 1 week's worth of pills and put them away in  a safe place. You should keep a separate, properly labeled bottle for this purpose. The remainder should be kept in the original bottle. Use this as your primary supply, until it runs out. Once it's gone, then you know that you have 1 week's worth of medicine, and it is time to come in for a prescription refill. If you do this correctly, it is unlikely that you will ever run out of medicine. To make sure that the above recommendation works, it is very important that you make sure your medication refill appointments are scheduled at least 1 week before you run out of medicine. To do this in an effective manner, make sure that you do not leave the office without scheduling your next medication management appointment. Always ask the nursing staff to show you in your prescription , when your medication will be running out. Then arrange for the receptionist to get you a return appointment, at least 7 days before you run out of medicine. Do not wait until you have 1 or 2 pills left, to come in. This is very poor planning and does not take into consideration that we may need to cancel appointments due to bad weather, sickness, or emergencies affecting our staff. DO NOT ACCEPT A "Partial Fill": If for any reason your pharmacy does not have enough pills/tablets to completely fill or refill your prescription, do not allow for a "partial fill". The law allows the pharmacy to complete that prescription within 72 hours, without requiring a new prescription. If they do not fill the rest of your prescription within those 72 hours, you will need a separate prescription to fill the remaining amount, which we will NOT provide. If the reason for the partial fill is your insurance, you will need to talk to the pharmacist about payment alternatives for the remaining tablets, but again, DO NOT ACCEPT A PARTIAL FILL, unless you can trust your pharmacist to obtain the remainder of the pills within 72 hours.  Prescription refills and/or  changes in medication(s):  Prescription refills, and/or changes in dose or medication, will be conducted only  during scheduled medication management appointments. (Applies to both, written and electronic prescriptions.) No refills on procedure days. No medication will be changed or started on procedure days. No changes, adjustments, and/or refills will be conducted on a procedure day. Doing so will interfere with the diagnostic portion of the procedure. No phone refills. No medications will be "called into the pharmacy". No Fax refills. No weekend refills. No Holliday refills. No after hours refills.  Remember:  Business hours are:  Monday to Thursday 8:00 AM to 4:00 PM Provider's Schedule: Milinda Pointer, MD - Appointments are:  Medication management: Monday and Wednesday 8:00 AM to 4:00 PM Procedure day: Tuesday and Thursday 7:30 AM to 4:00 PM Gillis Santa, MD - Appointments are:  Medication management: Tuesday and Thursday 8:00 AM to 4:00 PM Procedure day: Monday and Wednesday 7:30 AM to 4:00 PM (Last update: 02/19/2020) ____________________________________________________________________________________________  ____________________________________________________________________________________________  CBD (cannabidiol) WARNING  Applicable to: All individuals currently taking or considering taking CBD (cannabidiol) and, more important, all patients taking opioid analgesic controlled substances (pain medication). (Example: oxycodone; oxymorphone; hydrocodone; hydromorphone; morphine; methadone; tramadol; tapentadol; fentanyl; buprenorphine; butorphanol; dextromethorphan; meperidine; codeine; etc.)  Legal status: CBD remains a Schedule I drug prohibited for any use. CBD is illegal with one exception. In the [place], CBD has a limited Transport planner (FDA) approval for the treatment of two specific types of epilepsy disorders. Only one CBD product has been approved  by the FDA for this purpose: "Epidiolex". FDA is aware that some companies are marketing products containing cannabis and cannabis-derived compounds in ways that violate the [employer], Drug and Cosmetic Act [facility] Act) and that may put the health and safety of consumers at risk. The FDA, a Federal agency, has not enforced the CBD status since 2018.   Legality: Some manufacturers ship CBD products nationally, which is illegal. Often such products are sold online and are therefore available throughout the country. CBD is openly sold in head shops and health food stores in some states where such sales have not been explicitly legalized. Selling unapproved products with unsubstantiated therapeutic claims is not only a violation of the law, but also can put patients at risk, as these products have not been proven to be safe or effective. Federal illegality makes it difficult to conduct research on CBD.  Reference: "FDA Regulation of Cannabis and Cannabis-Derived Products, Including Cannabidiol (CBD)" - [employer]  Warning: CBD is not FDA approved and has not undergo the same manufacturing controls as prescription drugs.  This means that the purity and safety of available CBD may be questionable. Most of the time, despite manufacturer's claims, it is contaminated with THC (delta-9-tetrahydrocannabinol - the chemical in marijuana responsible for the "HIGH").  When this is the case, the [facility] contaminant will trigger a positive urine drug screen (UDS) test for Marijuana (carboxy-THC). Because a positive UDS for any illicit substance is a violation of our medication agreement, your opioid analgesics (pain medicine) may be permanently discontinued.  MORE ABOUT CBD  General Information: CBD  is a derivative of the Marijuana (cannabis sativa) plant discovered in 49. It is one of the 113 identified  substances found in Marijuana. It accounts for up to 40% of the plant's extract. As of 2018, preliminary clinical studies on CBD included research for the treatment of anxiety, movement disorders, and pain. CBD is available and consumed in multiple forms, including inhalation of smoke or vapor, as an aerosol spray, and by mouth. It may be supplied as an oil containing  CBD, capsules, dried cannabis, or as a liquid solution. CBD is thought not to be as psychoactive as THC (delta-9-tetrahydrocannabinol - the chemical in marijuana responsible for the "HIGH"). Studies suggest that CBD may interact with different biological target receptors in the body, including cannabinoid and other neurotransmitter receptors. As of 2018 the mechanism of action for its biological effects has not been determined.  Side-effects  Adverse reactions: Dry mouth, diarrhea, decreased appetite, fatigue, drowsiness, malaise, weakness, sleep disturbances, and others.  Drug interactions: CBC may interact with other medications such as blood-thinners. (Last update: 03/07/2020) ____________________________________________________________________________________________  ____________________________________________________________________________________________  Drug Holidays (Slow)  What is a "Drug Holiday"? Drug Holiday: is the name given to the period of time during which a patient stops taking a medication(s) for the purpose of eliminating tolerance to the drug.  Benefits Improved effectiveness of opioids. Decreased opioid dose needed to achieve benefits. Improved pain with lesser dose.  What is tolerance? Tolerance: is the progressive decreased in effectiveness of a drug due to its repetitive use. With repetitive use, the body gets use to the medication and as a consequence, it loses its effectiveness. This is a common problem seen with opioid pain medications. As a result, a larger dose of the drug is needed to achieve the  same effect that used to be obtained with a smaller dose.  How long should a "Drug Holiday" last? You should stay off of the pain medicine for at least 14 consecutive days. (2 weeks)  Should I stop the medicine "cold [place]"? No. You should always coordinate with your Pain Specialist so that he/she can provide you with the correct medication dose to make the transition as smoothly as possible.  How do I stop the medicine? Slowly. You will be instructed to decrease the daily amount of pills that you take by one (1) pill every seven (7) days. This is called a "slow downward taper" of your dose. For example: if you normally take four (4) pills per day, you will be asked to drop this dose to three (3) pills per day for seven (7) days, then to two (2) pills per day for seven (7) days, then to one (1) per day for seven (7) days, and at the end of those last seven (7) days, this is when the "Drug Holiday" would start.   Will I have withdrawals? By doing a "slow downward taper" like this one, it is unlikely that you will experience any significant withdrawal symptoms. Typically, what triggers withdrawals is the sudden stop of a high dose opioid therapy. Withdrawals can usually be avoided by slowly decreasing the dose over a prolonged period of time. If you do not follow these instructions and decide to stop your medication abruptly, withdrawals may be possible.  What are withdrawals? Withdrawals: refers to the wide range of symptoms that occur after stopping or dramatically reducing opiate drugs after heavy and prolonged use. Withdrawal symptoms do not occur to patients that use low dose opioids, or those who take the medication sporadically. Contrary to benzodiazepine (example: Valium, Xanax, etc.) or alcohol withdrawals ("Delirium Tremens"), opioid withdrawals are not lethal. Withdrawals are the physical manifestation of the body getting rid of the excess receptors.  Expected Symptoms Early symptoms of  withdrawal may include: Agitation Anxiety Muscle aches Increased tearing Insomnia Runny nose Sweating Yawning  Late symptoms of withdrawal may include: Abdominal cramping Diarrhea Dilated pupils Goose bumps Nausea Vomiting  Will I experience withdrawals? Due to the slow nature of the taper, it is very  unlikely that you will experience any.  What is a slow taper? Taper: refers to the gradual decrease in dose.  (Last update: 02/19/2020) ____________________________________________________________________________________________

## 2021-01-27 NOTE — Progress Notes (Signed)
Nursing Pain Medication Assessment:  Safety precautions to be maintained throughout the outpatient stay will include: orient to surroundings, keep bed in low position, maintain call bell within reach at all times, provide assistance with transfer out of bed and ambulation.  Medication Inspection Compliance: Pill count conducted under aseptic conditions, in front of the patient. Neither the pills nor the bottle was removed from the patient's sight at any time. Once count was completed pills were immediately returned to the patient in their original bottle.  Medication: Hydrocodone/APAP Pill/Patch Count:  25.5 of 90 pills remain Pill/Patch Appearance: Markings consistent with prescribed medication Bottle Appearance: Standard pharmacy container. Clearly labeled. Filled Date: 06 / 08 / 2022 Last Medication intake:  Today

## 2021-01-28 ENCOUNTER — Ambulatory Visit
Admission: RE | Admit: 2021-01-28 | Discharge: 2021-01-28 | Disposition: A | Discharge status: Home / Self Care | Payer: BC Managed Care – PPO | Source: Ambulatory Visit | Attending: Physician Assistant | Admitting: Physician Assistant

## 2021-01-28 DIAGNOSIS — Z1231 Encounter for screening mammogram for malignant neoplasm of breast: Secondary | ICD-10-CM | POA: Diagnosis not present

## 2021-01-28 DIAGNOSIS — Z1239 Encounter for other screening for malignant neoplasm of breast: Secondary | ICD-10-CM

## 2021-01-31 DIAGNOSIS — Z0001 Encounter for general adult medical examination with abnormal findings: Secondary | ICD-10-CM | POA: Insufficient documentation

## 2021-01-31 DIAGNOSIS — Z1211 Encounter for screening for malignant neoplasm of colon: Secondary | ICD-10-CM | POA: Insufficient documentation

## 2021-02-02 ENCOUNTER — Encounter: Payer: BC Managed Care – PPO | Admitting: Urology

## 2021-02-03 ENCOUNTER — Other Ambulatory Visit: Payer: Self-pay

## 2021-02-04 LAB — TOXASSURE SELECT 13 (MW), URINE

## 2021-02-04 NOTE — Progress Notes (Signed)
Negative mammogram

## 2021-02-05 ENCOUNTER — Other Ambulatory Visit: Payer: Self-pay | Admitting: Internal Medicine

## 2021-02-05 ENCOUNTER — Other Ambulatory Visit: Payer: Self-pay

## 2021-02-05 MED FILL — Azelastine HCl Nasal Spray 0.1% (137 MCG/SPRAY): NASAL | 30 days supply | Qty: 30 | Fill #1 | Status: AC

## 2021-02-08 ENCOUNTER — Other Ambulatory Visit: Payer: Self-pay

## 2021-02-09 ENCOUNTER — Other Ambulatory Visit: Payer: Self-pay | Admitting: Internal Medicine

## 2021-02-09 MED ORDER — HYDROCORTISONE 10 MG PO TABS
ORAL_TABLET | ORAL | 1 refills | Status: DC
  Filled 2021-02-09: qty 180, 90d supply, fill #0
  Filled 2021-04-23: qty 180, 90d supply, fill #1

## 2021-02-10 ENCOUNTER — Other Ambulatory Visit: Payer: Self-pay

## 2021-02-11 ENCOUNTER — Other Ambulatory Visit: Payer: Self-pay

## 2021-02-15 NOTE — Progress Notes (Signed)
PROVIDER NOTE: Information contained herein reflects review and annotations entered in association with encounter. Interpretation of such information and data should be left to medically-trained personnel. Information provided to patient can be located elsewhere in the medical record under "Patient Instructions". Document created using STT-dictation technology, any transcriptional errors that may result from process are unintentional.    Patient: Denise Burns  Service Category: Procedure  Provider: [name], MD  DOB: September 09, 1970  DOS: 02/16/2021  Location: [facility]  MRN: [id number]  Setting: Ambulatory - outpatient  Referring Provider: [name], NP  Type: Established Patient  Specialty: Interventional Pain Management  PCP: [name], NP   Primary Reason for Visit: Interventional Pain Management Treatment. CC: Hip Pain (bilateral)  Procedure:          Anesthesia, Analgesia, Anxiolysis:  Type: Hip bursa injection          Primary Purpose: Diagnostic Region: Upper (proximal) Femoral Region Level: Hip Joint Target Area: Trochanteric Bursa Approach: Posterolateral approach Laterality: Bilateral  Type: Local Anesthesia Indication(s): Analgesia         Local Anesthetic: Lidocaine 1-2% Route: Infiltration ([place]/IM) IV Access: Declined Sedation: Declined   Position: Prone   Indications: 1. Chronic hip pain (Bilateral)   2. Greater trochanteric bursitis (Bilateral)   3. Enthesopathy hip region (Left)   4. Osteoarthritis of hip (Right)   5. Primary osteoarthritis of both hips    Pain Score: Pre-procedure: 4 /10 Post-procedure: 0-No pain/10   Pre-op H&P Assessment:  Denise Burns is a 50 y.o. (year old), female patient, seen today for interventional treatment. She  has a past surgical history that includes Cholecystectomy (2010); Polypectomy; dental work; Dilation and curettage of uterus (2010); IR NEPHROSTOMY PLACEMENT LEFT (12/14/2020);  Nephrolithotomy (Left, 12/14/2020); Wisdom tooth extraction; and Diagnostic laparoscopy (2004). Denise Burns has a current medication list which includes the following prescription(s): azelastine, fludrocortisone, flonase sensimist, hydrocodone-acetaminophen, hydrocortisone, meloxicam, pseudoephedrine, tizanidine, and plenvu. Her primarily concern today is the Hip Pain (bilateral)  Initial Vital Signs:  Pulse/HCG Rate: 82ECG Heart Rate: 76 Temp: (!) 97.5 F (36.4 C) Resp: 18 BP: (!) 155/94 SpO2: 100 %  BMI: Estimated body mass index is 30.9 kg/m as calculated from the following:   Height as of this encounter: 5\' 4"  (1.626 m).   Weight as of this encounter: 180 lb (81.6 kg).  Risk Assessment: Allergies: Reviewed. She is allergic to lisinopril, sulfacetamide sodium (acne), tramadol, and sulfa antibiotics.  Allergy Precautions: None required Coagulopathies: Reviewed. None identified.  Blood-thinner therapy: None at this time Active Infection(s): Reviewed. None identified. Denise Burns is afebrile  Site Confirmation: Denise Burns was asked to confirm the procedure and laterality before marking the site Procedure checklist: Completed Consent: Before the procedure and under the influence of no sedative(s), amnesic(s), or anxiolytics, the patient was informed of the treatment options, risks and possible complications. To fulfill our ethical and legal obligations, as recommended by the American Medical Association's Code of Ethics, I have informed the patient of my clinical impression; the nature and purpose of the treatment or procedure; the risks, benefits, and possible complications of the intervention; the alternatives, including doing nothing; the risk(s) and benefit(s) of the alternative treatment(s) or procedure(s); and the risk(s) and benefit(s) of doing nothing. The patient was provided information about the general risks and possible complications associated with the procedure. These may  include, but are not limited to: failure to achieve desired goals, infection, bleeding, organ or nerve damage, allergic reactions, paralysis, and  death. In addition, the patient was informed of those risks and complications associated to the procedure, such as failure to decrease pain; infection; bleeding; organ or nerve damage with subsequent damage to sensory, motor, and/or autonomic systems, resulting in permanent pain, numbness, and/or weakness of one or several areas of the body; allergic reactions; (i.e.: anaphylactic reaction); and/or death. Furthermore, the patient was informed of those risks and complications associated with the medications. These include, but are not limited to: allergic reactions (i.e.: anaphylactic or anaphylactoid reaction(s)); adrenal axis suppression; blood sugar elevation that in diabetics may result in ketoacidosis or comma; water retention that in patients with history of congestive heart failure may result in shortness of breath, pulmonary edema, and decompensation with resultant heart failure; weight gain; swelling or edema; medication-induced neural toxicity; particulate matter embolism and blood vessel occlusion with resultant organ, and/or nervous system infarction; and/or aseptic necrosis of one or more joints. Finally, the patient was informed that Medicine is not an exact science; therefore, there is also the possibility of unforeseen or unpredictable risks and/or possible complications that may result in a catastrophic outcome. The patient indicated having understood very clearly. We have given the patient no guarantees and we have made no promises. Enough time was given to the patient to ask questions, all of which were answered to the patient's satisfaction. Denise Burns has indicated that she wanted to continue with the procedure. Attestation: I, the ordering provider, attest that I have discussed with the patient the benefits, risks, side-effects, alternatives,  likelihood of achieving goals, and potential problems during recovery for the procedure that I have provided informed consent. Date  Time: 02/16/2021  8:49 AM  Pre-Procedure Preparation:  Monitoring: As per clinic protocol. Respiration, ETCO2, SpO2, BP, heart rate and rhythm monitor placed and checked for adequate function Safety Precautions: Patient was assessed for positional comfort and pressure points before starting the procedure. Time-out: I initiated and conducted the "Time-out" before starting the procedure, as per protocol. The patient was asked to participate by confirming the accuracy of the "Time Out" information. Verification of the correct person, site, and procedure were performed and confirmed by me, the nursing staff, and the patient. "Time-out" conducted as per Joint Commission's Universal Protocol (UP.01.01.01). Time: 0936  Description of Procedure:          Area Prepped: Entire Posterolateral hip area. DuraPrep (Iodine Povacrylex [0.7% available iodine] and Isopropyl Alcohol, 74% w/w) Safety Precautions: Aspiration looking for blood return was conducted prior to all injections. At no point did we inject any substances, as a needle was being advanced. No attempts were made at seeking any paresthesias. Safe injection practices and needle disposal techniques used. Medications properly checked for expiration dates. SDV (single dose vial) medications used. Description of the Procedure: Protocol guidelines were followed. The patient was placed in position over the procedure table. The target area was identified and the area prepped in the usual manner. Skin & deeper tissues infiltrated with local anesthetic. Appropriate amount of time allowed to pass for local anesthetics to take effect. The procedure needles were then advanced to the target area. Proper needle placement secured. Negative aspiration confirmed. Solution injected in intermittent fashion, asking for systemic symptoms every  0.5cc of injectate. The needles were then removed and the area cleansed, making sure to leave some of the prepping solution back to take advantage of its long term bactericidal properties. Vitals:   02/16/21 0848 02/16/21 0932 02/16/21 0937 02/16/21 0941  BP: (!) 155/94 (!) 150/95 (!) 141/94 Marland Kitchen)  154/95  Pulse: 82     Resp: 18 17 (!) 21 20  Temp: (!) 97.5 F (36.4 C)     TempSrc: Temporal     SpO2: 100% 98% 100% 98%  Weight: 180 lb (81.6 kg)     Height: 5\' 4"  (1.626 m)       Start Time: 0936 hrs. End Time: 0941 hrs.        Materials:  Needle(s) Type: Spinal Needle Gauge: 22G Length: 5.0-in Medication(s): Please see orders for medications and dosing details.  Imaging Guidance (Non-Spinal):          Type of Imaging Technique: Fluoroscopy Guidance (Non-Spinal) Indication(s): Assistance in needle guidance and placement for procedures requiring needle placement in or near specific anatomical locations not easily accessible without such assistance. Exposure Time: Please see nurses notes. Contrast: Before injecting any contrast, we confirmed that the patient did not have an allergy to iodine, shellfish, or radiological contrast. Once satisfactory needle placement was completed at the desired level, radiological contrast was injected. Contrast injected under live fluoroscopy. No contrast complications. See chart for type and volume of contrast used. Fluoroscopic Guidance: I was personally present during the use of fluoroscopy. "Tunnel Vision Technique" used to obtain the best possible view of the target area. Parallax error corrected before commencing the procedure. "Direction-depth-direction" technique used to introduce the needle under continuous pulsed fluoroscopy. Once target was reached, antero-posterior, oblique, and lateral fluoroscopic projection used confirm needle placement in all planes. Images permanently stored in EMR. Interpretation: I personally interpreted the imaging  intraoperatively. Adequate needle placement confirmed in multiple planes. Appropriate spread of contrast into desired area was observed. No evidence of afferent or efferent intravascular uptake. Permanent images saved into the patient's record.  Antibiotic Prophylaxis:   Anti-infectives (From admission, onward)    None      Indication(s): None identified  Post-operative Assessment:  Post-procedure Vital Signs:  Pulse/HCG Rate: 8294 Temp: (!) 97.5 F (36.4 C) Resp: 20 BP: (!) 154/95 SpO2: 98 %  EBL: None  Complications: No immediate post-treatment complications observed by team, or reported by patient.  Note: The patient tolerated the entire procedure well. A repeat set of vitals were taken after the procedure and the patient was kept under observation following institutional policy, for this type of procedure. Post-procedural neurological assessment was performed, showing return to baseline, prior to discharge. The patient was provided with post-procedure discharge instructions, including a section on how to identify potential problems. Should any problems arise concerning this procedure, the patient was given instructions to immediately contact us, at any time, without hesitation. In any case, we plan to contact the patient by telephone for a follow-up status report regarding this interventional procedure.  Comments:  No additional relevant information.  Plan of Care  Orders:  Orders Placed This Encounter  Procedures   HIP INJECTION    Scheduling Instructions:     Side: Bilateral     Sedation: No Sedation.     Timeframe: Today   DG PA[facility] C-ARM 1-60 MIN NO REPORT    Intraoperative interpretation by procedural physician at [facility].    Standing Status:   Standing    Number of Occurrences:   1    Order Specific Question:   Reason for exam:    Answer:   Assistance in needle guidance and placement for procedures requiring needle placement in or near specific  anatomical locations not easily accessible without such assistance.   Informed Consent Details: Physician/Practitioner Attestation; Transcribe to  consent form and obtain patient signature    Nursing Order: Transcribe to consent form and obtain patient signature. Note: Always confirm laterality of pain with [name], before procedure.    Order Specific Question:   Physician/Practitioner attestation of informed consent for procedure/surgical case    Answer:   I, the physician/practitioner, attest that I have discussed with the patient the benefits, risks, side effects, alternatives, likelihood of achieving goals and potential problems during recovery for the procedure that I have provided informed consent.    Order Specific Question:   Procedure    Answer:   Hip injection    Order Specific Question:   Physician/Practitioner performing the procedure    Answer:   Rosalynn Sergent A. Dossie Arbour, MD    Order Specific Question:   Indication/Reason    Answer:   Hip Joint Pain (Arthralgia)   Provide equipment / supplies at bedside    "Block Tray" (Disposable  single use) Needle type: SpinalSpinal Amount/quantity: 2 Size: Long (7-inch) Gauge: 22G    Standing Status:   Standing    Number of Occurrences:   1    Order Specific Question:   Specify    Answer:   Block Tray    Chronic Opioid Analgesic:  Hydrocodone/APAP 7.5/325 1 tablet p.o. 3 times daily (22.5 mg/day of hydrocodone)  MME/day: 22.5 mg/day.   Medications ordered for procedure: Meds ordered this encounter  Medications   iohexol (OMNIPAQUE) 180 MG/ML injection 10 mL    Must be Myelogram-compatible. If not available, you may substitute with a water-soluble, non-ionic, hypoallergenic, myelogram-compatible radiological contrast medium.   lidocaine (XYLOCAINE) 2 % (with pres) injection 400 mg   methylPREDNISolone acetate (DEPO-MEDROL) injection 160 mg   ropivacaine (PF) 2 mg/mL (0.2%) (NAROPIN) injection 8 mL    Medications administered: We  administered iohexol, lidocaine, methylPREDNISolone acetate, and ropivacaine (PF) 2 mg/mL (0.2%).  See the medical record for exact dosing, route, and time of administration.  Follow-up plan:   Return in about 2 weeks (around 03/02/2021) for procedure day (afternoon VV) (PPE).      Interventional Therapies  Risk  Complexity Considerations:   Estimated body mass index is 32.1 kg/m as calculated from the following:   Height as of this encounter: 5\' 4"  (1.626 m).   Weight as of this encounter: 187 lb (84.8 kg). WNL   Planned  Pending:   Diagnostic/therapeutic bilateral trochanteric bursa injection #1    Under consideration:   Diagnostic L2-3 L-FCT BLK #1  Diagnostic bilateral SI joint Blk  Possible bilateral SI joint RFA  Diagnostic bilateral L5 TFESI  Diagnostic left-sided L2 TFESI    Completed:   Therapeutic bilateral lumbar facet block x3 (09/08/2020) (4-0) (100/100/60/60)  Therapeutic right lumbar facet RFA x1 (01/07/2020) (4-0) (100/100/90/>75)  Therapeutic left lumbar facet RFA x1 (02/04/2020) (4-0) (100/100/90/>75)  Therapeutic right hip joint injection x2 (08/20/2020) (5-1) (100/100/80/80)  Therapeutic left hip joint injection x1 (08/20/2020) (5-1) (100/100/80/80)    Therapeutic  Palliative (PRN) options:   Palliative bilateral lumbar facet block #4  Palliative right lumbar facet RFA #2 (last done 01/07/2020) Palliative left lumbar facet RFA #2 (last done 02/04/2020)     Recent Visits Date Type Provider Dept  01/27/21 Office Visit Milinda Pointer, MD [facility]  Showing recent visits within past 90 days and meeting all other requirements Today's Visits Date Type Provider Dept  02/16/21 Procedure visit Milinda Pointer, MD [facility]  Showing today's visits and meeting all other requirements Future Appointments Date Type Provider Dept  03/02/21 Appointment Milinda Pointer, MD [facility]  04/21/21 Appointment Milinda Pointer, MD  [facility]  Showing future appointments within next 90 days and meeting all other requirements Disposition: Discharge home  Discharge (Date  Time): 02/16/2021; 0950 hrs.   Primary Care Physician: [name], NP Location: [facility] Note by: [name], MD Date: 02/16/2021; Time: 12:28 PM  Disclaimer:  Medicine is not an Chief Strategy Officer. The only guarantee in medicine is that nothing is guaranteed. It is important to note that the decision to proceed with this intervention was based on the information collected from the patient. The Data and conclusions were drawn from the patient's questionnaire, the interview, and the physical examination. Because the information was provided in large part by the patient, it cannot be guaranteed that it has not been purposely or unconsciously manipulated. Every effort has been made to obtain as much relevant data as possible for this evaluation. It is important to note that the conclusions that lead to this procedure are derived in large part from the available data. Always take into account that the treatment will also be dependent on availability of resources and existing treatment guidelines, considered by other Pain Management Practitioners as being common knowledge and practice, at the time of the intervention. For Medico-Legal purposes, it is also important to point out that variation in procedural techniques and pharmacological choices are the acceptable norm. The indications, contraindications, technique, and results of the above procedure should only be interpreted and judged by a Board-Certified Interventional Pain Specialist with extensive familiarity and expertise in the same exact procedure and technique.

## 2021-02-16 ENCOUNTER — Encounter: Payer: Self-pay | Admitting: Pain Medicine

## 2021-02-16 ENCOUNTER — Ambulatory Visit (INDEPENDENT_AMBULATORY_CARE_PROVIDER_SITE_OTHER): Payer: BC Managed Care – PPO | Admitting: Urology

## 2021-02-16 ENCOUNTER — Ambulatory Visit
Admission: RE | Admit: 2021-02-16 | Discharge: 2021-02-16 | Disposition: A | Discharge status: Home / Self Care | Payer: BC Managed Care – PPO | Source: Ambulatory Visit | Attending: Pain Medicine | Admitting: Pain Medicine

## 2021-02-16 ENCOUNTER — Ambulatory Visit (HOSPITAL_BASED_OUTPATIENT_CLINIC_OR_DEPARTMENT_OTHER): Payer: BC Managed Care – PPO | Admitting: Pain Medicine

## 2021-02-16 ENCOUNTER — Other Ambulatory Visit: Payer: Self-pay

## 2021-02-16 VITALS — BP 154/95 | HR 82 | Temp 97.5°F | Resp 20 | Ht 64.0 in | Wt 180.0 lb

## 2021-02-16 VITALS — BP 151/94 | HR 80 | Ht 64.0 in | Wt 180.0 lb

## 2021-02-16 DIAGNOSIS — M1611 Unilateral primary osteoarthritis, right hip: Secondary | ICD-10-CM

## 2021-02-16 DIAGNOSIS — N200 Calculus of kidney: Secondary | ICD-10-CM | POA: Diagnosis not present

## 2021-02-16 DIAGNOSIS — M160 Bilateral primary osteoarthritis of hip: Secondary | ICD-10-CM

## 2021-02-16 DIAGNOSIS — M7062 Trochanteric bursitis, left hip: Secondary | ICD-10-CM | POA: Diagnosis not present

## 2021-02-16 DIAGNOSIS — M25552 Pain in left hip: Secondary | ICD-10-CM | POA: Insufficient documentation

## 2021-02-16 DIAGNOSIS — M76892 Other specified enthesopathies of left lower limb, excluding foot: Secondary | ICD-10-CM

## 2021-02-16 DIAGNOSIS — M7061 Trochanteric bursitis, right hip: Secondary | ICD-10-CM | POA: Diagnosis not present

## 2021-02-16 DIAGNOSIS — G8929 Other chronic pain: Secondary | ICD-10-CM | POA: Insufficient documentation

## 2021-02-16 DIAGNOSIS — M25551 Pain in right hip: Secondary | ICD-10-CM

## 2021-02-16 MED ORDER — ROPIVACAINE HCL 2 MG/ML IJ SOLN
INTRAMUSCULAR | Status: AC
  Filled 2021-02-16: qty 20

## 2021-02-16 MED ORDER — LIDOCAINE HCL (PF) 2 % IJ SOLN
INTRAMUSCULAR | Status: AC
  Filled 2021-02-16: qty 10

## 2021-02-16 MED ORDER — METHYLPREDNISOLONE ACETATE 80 MG/ML IJ SUSP
160.0000 mg | Freq: Once | INTRAMUSCULAR | Status: AC
  Administered 2021-02-16: 160 mg via INTRA_ARTICULAR
  Filled 2021-02-16: qty 2

## 2021-02-16 MED ORDER — LIDOCAINE HCL 2 % IJ SOLN
20.0000 mL | Freq: Once | INTRAMUSCULAR | Status: AC
Start: 2021-02-16 — End: 2021-02-16
  Administered 2021-02-16: 200 mg

## 2021-02-16 MED ORDER — IOHEXOL 180 MG/ML  SOLN
10.0000 mL | Freq: Once | INTRAMUSCULAR | Status: AC
  Administered 2021-02-16: 10 mL via INTRA_ARTICULAR
  Filled 2021-02-16: qty 20

## 2021-02-16 MED ORDER — ROPIVACAINE HCL 2 MG/ML IJ SOLN
8.0000 mL | Freq: Once | INTRAMUSCULAR | Status: AC
  Administered 2021-02-16: 8 mL via INTRA_ARTICULAR

## 2021-02-16 NOTE — Progress Notes (Signed)
02/16/2021 1:17 PM   Denise Burns 21-Dec-1970 [id number]  Referring provider: [name], NP [place],  [place] [address]  Chief Complaint  Patient presents with   Staghorn calculus    HPI: 50 year old female who presents today for postop visit.  She underwent uncomplicated left PCNL on 12/15/2020.  She follows up today with renal ultrasound which shows no further stone burden in her left kidney without hydronephrosis.  She does have a known contralateral stone measuring 1.1 cm in the right lower pole.  This is appreciated on follow-up renal ultrasound.  Stone analysis was reviewed today, stone consistent with 60% struvite, 40% carbonate Apatate.  She tolerated the procedure well.  She denies any flank pain today.  She is interested in treating her contralateral stone.  She does have some stressors right now, her mother was just diagnosed with brain and lung cancer and will be undergoing craniotomy and chemotherapy in the near future.   PMH: Past Medical History:  Diagnosis Date   Addison disease ([place])    Arthritis    Back pain    Complication of anesthesia    nausea   GERD (gastroesophageal reflux disease)     Surgical History: Past Surgical History:  Procedure Laterality Date   CHOLECYSTECTOMY  2010   dental work     DIAGNOSTIC LAPAROSCOPY  2004   endometriosis dx   DILATION AND CURETTAGE OF UTERUS  2010   IR NEPHROSTOMY PLACEMENT LEFT  12/14/2020   NEPHROLITHOTOMY Left 12/14/2020   Procedure: NEPHROLITHOTOMY PERCUTANEOUS;  Surgeon: Hollice Espy, MD;  Location: [facility] ORS;  Service: Urology;  Laterality: Left;   POLYPECTOMY     WISDOM TOOTH EXTRACTION      Home Medications:  Allergies as of 02/16/2021       Reactions   Lisinopril Cough   Other reaction(s): rash   Sulfacetamide Sodium (acne)    Other reaction(s): sores   Tramadol Other (See Comments)   Other reaction(s): muscle tension Other reaction(s): muscle tension    Sulfa Antibiotics Other (See Comments)   Blisters in mouth         Medication List        Accurate as of February 16, 2021  1:17 PM. If you have any questions, ask your nurse or doctor.          STOP taking these medications    promethazine 25 MG tablet Commonly known as: PHENERGAN Stopped by: Hollice Espy, MD       TAKE these medications    azelastine 0.1 % nasal spray Commonly known as: ASTELIN Place 1 spray into both nostrils 2 (two) times daily. What changed: Another medication with the same name was removed. Continue taking this medication, and follow the directions you see here. Changed by: Hollice Espy, MD   Flonase Sensimist 27.5 MCG/SPRAY nasal spray Generic drug: fluticasone Place 1 spray into the nose in the morning and at bedtime.   fludrocortisone 0.1 MG tablet Commonly known as: FLORINEF TAKE 1/2 TABLET BY MOUTH DAILY What changed: how much to take   HYDROcodone-acetaminophen 7.5-325 MG tablet Commonly known as: NORCO Take 1 tablet by mouth every 8 (eight) hours as needed for severe pain. Must last 30 days. What changed: Another medication with the same name was removed. Continue taking this medication, and follow the directions you see here. Changed by: Hollice Espy, MD   hydrocortisone 10 MG tablet Commonly known as: CORTEF Take 1 and 1/2 tablets by mouth daily  with breakfast and 1/2 tablet daily in the evening   meloxicam 15 MG tablet Commonly known as: MOBIC TAKE 1 TABLET BY MOUTH DAILY   Plenvu 140 g Solr Generic drug: PEG-KCl-NaCl-NaSulf-Na Asc-C Take as directed for colonoscopy prep (Take 1 kit by mouth as directed. Manufacturer's coupon Universal coupon code:BIN: P2366821; GROUP: [id number]; PCN: [id number]; ID: [id number]; PAY NO MORE $50; NO prior authorization)   pseudoephedrine 30 MG tablet Commonly known as: SUDAFED Take 30 mg by mouth every 12 (twelve) hours as needed for congestion.   tiZANidine 4 MG tablet Commonly known  as: ZANAFLEX Take 1 tablet (4 mg total) by mouth every 8 (eight) hours as needed for muscle spasms.        Allergies:  Allergies  Allergen Reactions   Lisinopril Cough    Other reaction(s): rash   Sulfacetamide Sodium (Acne)     Other reaction(s): sores   Tramadol Other (See Comments)    Other reaction(s): muscle tension Other reaction(s): muscle tension   Sulfa Antibiotics Other (See Comments)    Blisters in mouth     Family History: Family History  Problem Relation Age of Onset   Arthritis Mother    Hyperlipidemia Mother    Hypertension Mother    High blood pressure Mother    Alcohol abuse Father    Hearing loss Father    Hyperlipidemia Father    High blood pressure Father    Colon polyps Maternal Grandmother 70   Colon cancer Neg Hx    Esophageal cancer Neg Hx    Stomach cancer Neg Hx    Rectal cancer Neg Hx     Social History:  reports that she quit smoking about 26 years ago. Her smoking use included cigarettes. She has never used smokeless tobacco. She reports previous alcohol use. She reports that she does not use drugs.   Physical Exam: BP (!) 151/94   Pulse 80   Ht 5' 4" (1.626 m)   Wt 180 lb (81.6 kg)   LMP 12/01/2020 (Approximate)   BMI 30.90 kg/m   Constitutional:  Alert and oriented, No acute distress. HEENT: [place] AT, moist mucus membranes.  Trachea midline, no masses. Cardiovascular: No clubbing, cyanosis, or edema. Respiratory: Normal respiratory effort, no increased work of breathing. GU: Left flank incision healed well Skin: No rashes, bruises or suspicious lesions. Neurologic: Grossly intact, no focal deficits, moving all 4 extremities. Psychiatric: Normal mood and affect.  Laboratory Data: Lab Results  Component Value Date   WBC 10.1 12/14/2020   HGB 11.8 (L) 12/15/2020   HCT 34.6 (L) 12/15/2020   MCV 92.9 12/14/2020   PLT 223 12/14/2020    Lab Results  Component Value Date   CREATININE 0.74 12/15/2020    Lab Results   Component Value Date   HGBA1C 6.1 04/30/2018    Pertinent Imaging:  US RENAL  Narrative CLINICAL DATA:  Left staghorn calculus follow-up.  EXAM: RENAL / URINARY TRACT ULTRASOUND COMPLETE  COMPARISON:  CT scan October 06, 2020  FINDINGS: Right Kidney:  Renal measurements: 12.7 x 5.5 x 5.2 cm = volume: 190 mL. There is a nonobstructive shadowing stone measuring up to 1.3 cm in the lower pole of the right kidney. No hydronephrosis.  Left Kidney:  Renal measurements: 12.7 x 5.7 x 5.3 cm = volume: 201 mL. The previously identified left staghorn calculus is not visualized.  Bladder:  Appears normal for degree of bladder distention.  Other:  None.  IMPRESSION: 1. 13 mm nonobstructive stone in the  lower right kidney. 2. The previously identified left staghorn calculus is not visualized. 3. No hydronephrosis bilaterally. 4. No other abnormalities.   Electronically Signed By: Dorise Bullion III M.D On: 01/21/2021 07:18  Renal ultrasound personally reviewed today, agree with radiologic interpretation  Assessment & Plan:    1. Staghorn calculus Status post uncomplicated left PCNL  Stone analysis reviewed  Follow-up renal ultrasound is reassuring  2. Right kidney stone Approximately 1.1 cm right nonobstructing lower pole stone  Given her history of infection stone, contralateral staghorn, recurrent pyelonephritis/UTIs, would strongly recommend treatment for this stone.   SWL has a lower stone free rate in a single procedure, but also a lower complication rate compared to ureteroscopy and avoids a stent and associated stent related symptoms. Possible complications include renal hematoma, steinstrasse, and need for additional treatment. We discussed the role of his increased skin to stone distance can lead to decreased efficacy with shockwave lithotripsy.   Ureteroscopy with laser lithotripsy and stent placement has a higher stone free rate than SWL in a single  procedure, however increased complication rate including possible infection, ureteral injury, bleeding, and stent related morbidity. Common stent related symptoms include dysuria, urgency/frequency, and flank pain.   After an extensive discussion of the risks and benefits of the above treatment options, the patient would like to proceed with URS; will likely wait a few months to help her mother through her cancer treatment given that she is currently asymptomatic  Hollice Espy, MD  [facility] [address] [place], [place] [address] [phone]

## 2021-02-16 NOTE — Patient Instructions (Signed)

## 2021-02-16 NOTE — Progress Notes (Signed)
Safety precautions to be maintained throughout the outpatient stay will include: orient to surroundings, keep bed in low position, maintain call bell within reach at all times, provide assistance with transfer out of bed and ambulation.  

## 2021-02-17 ENCOUNTER — Telehealth: Payer: Self-pay

## 2021-02-17 NOTE — Telephone Encounter (Signed)
Post procedure phone call.  LM 

## 2021-03-01 ENCOUNTER — Other Ambulatory Visit: Payer: Self-pay

## 2021-03-01 NOTE — Progress Notes (Signed)
Patient: Denise Burns  Service Category: E/M  Provider: [name], MD  DOB: 26-Feb-1971  DOS: 03/02/2021  Location: Office  MRN: [id number]  Setting: Ambulatory outpatient  Referring Provider: [name], NP  Type: Established Patient  Specialty: Interventional Pain Management  PCP: Denise Freshwater, NP  Location: Remote location  Delivery: TeleHealth     Virtual Encounter - Pain Management PROVIDER NOTE: Information contained herein reflects review and annotations entered in association with encounter. Interpretation of such information and data should be left to medically-trained personnel. Information provided to patient can be located elsewhere in the medical record under "Patient Instructions". Document created using STT-dictation technology, any transcriptional errors that may result from process are unintentional.    Contact & Pharmacy Preferred: [phone] Home: [phone] (home) Mobile: [phone] (mobile) E-mail: [email or web address]  [place] [address] Phone: [phone] Fax: [place], [place] [address] Phone: [phone] Fax: [phone]   Pre-screening  [name] offered "in-person" vs "virtual" encounter. She indicated preferring virtual for this encounter.   Reason COVID-19*  Social distancing based on CDC and AMA recommendations.   I contacted Denise Burns on 03/02/2021 via telephone.      I clearly identified myself as Denise Cola, MD. I verified that I was speaking with the correct person using two identifiers (Name: Denise Burns, and date of birth: 09-23-1970).  Consent I sought verbal advanced consent from Denise Burns for virtual visit interactions. I informed Denise Burns of possible security and privacy concerns, risks, and limitations associated with providing  "not-in-person" medical evaluation and management services. I also informed Denise Burns of the availability of "in-person" appointments. Finally, I informed her that there would be a charge for the virtual visit and that she could be  personally, fully or partially, financially responsible for it. Denise Burns expressed understanding and agreed to proceed.   Historic Elements   Denise Burns is a 51 y.o. year old, female patient evaluated today after our last contact on 02/16/2021. Denise Burns  has a past medical history of Addison disease ([place]), Arthritis, Back pain, Complication of anesthesia, and GERD (gastroesophageal reflux disease). She also  has a past surgical history that includes Cholecystectomy (2010); Polypectomy; dental work; Dilation and curettage of uterus (2010); IR NEPHROSTOMY PLACEMENT LEFT (12/14/2020); Nephrolithotomy (Left, 12/14/2020); Wisdom tooth extraction; and Diagnostic laparoscopy (2004). Denise Burns has a current medication list which includes the following prescription(s): azelastine, fludrocortisone, flonase sensimist, hydrocodone-acetaminophen, hydrocortisone, meloxicam, plenvu, pseudoephedrine, and tizanidine. She  reports that she quit smoking about 26 years ago. Her smoking use included cigarettes. She has never used smokeless tobacco. She reports previous alcohol use. She reports that she does not use drugs. Denise Burns is allergic to lisinopril, sulfacetamide sodium (acne), tramadol, and sulfa antibiotics.   HPI  Today, she is being contacted for both, medication management and a post-procedure assessment.  The patient indicates doing well with the current medication regimen. No adverse reactions or side effects reported to the medications.  On 01/27/2021 I provided the patient with 3 prescriptions for her hydrocodone.  According to the PMP, she has had 1 of 3 filled on 02/05/2021 which should last her until 03/07/2021 at which time she should have the second  prescription failed and that should last until 04/06/2021 when she would get her third on last prescription failed to last  until 05/06/2021.  She has a medication management appointment scheduled for 04/21/2021.  Currently the patient indicates doing well with her medications and the bilateral trochanteric bursa injection has provided her with excellent relief of the pain in the lower extremities.  She indicates that now she can do stairs without having any pain.  At this time she indicates that needing anything else from [place] and therefore we will see her back on follow-up on 04/21/2021.  RTCB: The patient is scheduled to return on 04/21/2021.  Post-Procedure Evaluation  Procedure (02/16/2021):   Type: Hip bursa injection          Primary Purpose: Diagnostic Region: Upper (proximal) Femoral Region Level: Hip Joint Target Area: Trochanteric Bursa Approach: Posterolateral approach Laterality: Bilateral  Pre-procedure pain level: 4/10 Post-procedure: 0/10 (100% relief)  Anxiolysis: none.  Effectiveness during initial hour after procedure (Ultra-Short Term Relief): 100 %.  Local anesthetic used: Long-acting (4-6 hours) Effectiveness: Defined as any analgesic benefit obtained secondary to the administration of local anesthetics. This carries significant diagnostic value as to the etiological location, or anatomical origin, of the pain. Duration of benefit is expected to coincide with the duration of the local anesthetic used.  Effectiveness during initial 4-6 hours after procedure (Short-Term Relief): 100 %.  Long-term benefit: Defined as any relief past the pharmacologic duration of the local anesthetics.  Effectiveness past the initial 6 hours after procedure (Long-Term Relief): 90 % (ongoing).  Benefits, current: Defined as benefit present at the time of this evaluation.   Analgesia:   The patient indicates currently enjoying an ongoing 90% relief of the pain. Function: Denise Burns reports  improvement in function ROM: Denise Burns reports improvement in ROM  Pharmacotherapy Assessment   Analgesic: Hydrocodone/APAP 7.5/325 1 tablet p.o. 3 times daily (22.5 mg/day of hydrocodone)  MME/day: 22.5 mg/day.   Monitoring: [place] PMP: PDMP reviewed during this encounter.       Pharmacotherapy: No side-effects or adverse reactions reported. Compliance: No problems identified. Effectiveness: Clinically acceptable. Plan: Refer to "POC". UDS:  Summary  Date Value Ref Range Status  01/27/2021 Note  Final    Comment:    ==================================================================== [employer] Select 13 (MW) ==================================================================== Test                             Result       Flag       Units  Drug Present and Declared for Prescription Verification   Hydrocodone                    544          EXPECTED   ng/mg creat   Hydromorphone                  282          EXPECTED   ng/mg creat   Dihydrocodeine                 100          EXPECTED   ng/mg creat   Norhydrocodone                 735          EXPECTED   ng/mg creat    Sources of hydrocodone include scheduled prescription medications.    Hydromorphone, dihydrocodeine and norhydrocodone are expected    metabolites of hydrocodone. Hydromorphone and dihydrocodeine are    also available as  scheduled prescription medications.  ==================================================================== Test                      Result    Flag   Units      Ref Range   Creatinine              171              mg/dL      >=20 ==================================================================== Declared Medications:  The flagging and interpretation on this report are based on the  following declared medications.  Unexpected results may arise from  inaccuracies in the declared medications.   **Note: The testing scope of this panel includes these medications:   Hydrocodone (Norco)   **Note:  The testing scope of this panel does not include the  following reported medications:   Acetaminophen (Norco)  Azelastine  Fludrocortisone (Florinef)  Fluticasone (Flonase)  Hydrocortisone (Cortef)  Meloxicam  Polyethylene Glycol (Plenvu)  Promethazine  Pseudoephedrine (Sudafed)  Tizanidine ==================================================================== For clinical consultation, please call [phone]. ====================================================================      Laboratory Chemistry Profile   Renal Lab Results  Component Value Date   BUN 12 12/15/2020   CREATININE 0.74 13/24/4010   BCR NOT APPLICABLE 27/25/3664   GFR 72.11 08/13/2019   GFRAA 86 08/16/2018   GFRNONAA >60 12/15/2020    Hepatic Lab Results  Component Value Date   AST 38 10/06/2020   ALT 34 10/06/2020   ALBUMIN 3.8 10/06/2020   ALKPHOS 68 10/06/2020   LIPASE 30 10/06/2020    Electrolytes Lab Results  Component Value Date   NA 138 12/15/2020   K 4.0 12/15/2020   CL 107 12/15/2020   CALCIUM 8.0 (L) 12/15/2020   MG 2.1 08/16/2018    Bone Lab Results  Component Value Date   VD25OH 13.55 (L) 04/30/2018   25OHVITD1 67 08/16/2018   25OHVITD2 <1.0 08/16/2018   25OHVITD3 67 08/16/2018    Inflammation (CRP: Acute Phase) (ESR: Chronic Phase) Lab Results  Component Value Date   CRP 2 08/16/2018   ESRSEDRATE 37 (H) 08/16/2018   LATICACIDVEN 1.4 10/06/2020         Note: Above Lab results reviewed.  Imaging  DG PAIN CLINIC C-ARM 1-60 MIN NO REPORT Fluoro was used, but no Radiologist interpretation will be provided.  Please refer to "NOTES" tab for provider progress note.  Assessment  The primary encounter diagnosis was Chronic hip pain (Bilateral). Diagnoses of Greater trochanteric bursitis (Bilateral), Enthesopathy hip region (Left), Osteoarthritis of hip (Right), Chronic pain syndrome, Chronic low back pain (1ry area of Pain) (Bilateral) (R>L) w/o sciatica, Chronic  sacroiliac joint pain (2ry area of Pain) (Right), Pharmacologic therapy, Chronic use of opiate for therapeutic purpose, and Encounter for medication management were also pertinent to this visit.  Plan of Care  Problem-specific:  No problem-specific Assessment & Plan notes found for this encounter.  Ms. Merial Moritz has a current medication list which includes the following long-term medication(s): azelastine, flonase sensimist, hydrocodone-acetaminophen, and meloxicam.  Pharmacotherapy (Medications Ordered): No orders of the defined types were placed in this encounter.  Orders:  No orders of the defined types were placed in this encounter.  Follow-up plan:   No follow-ups on file.     Interventional Therapies  Risk  Complexity Considerations:   Estimated body mass index is 32.1 kg/m as calculated from the following:   Height as of this encounter: 5' 4"  (1.626 m).   Weight as of this encounter: 187 lb (  84.8 kg). WNL   Planned  Pending:   Diagnostic/therapeutic bilateral trochanteric bursa injection #1    Under consideration:   Diagnostic L2-3 L-FCT BLK #1  Diagnostic bilateral SI joint Blk  Possible bilateral SI joint RFA  Diagnostic bilateral L5 TFESI  Diagnostic left-sided L2 TFESI    Completed:   Therapeutic bilateral lumbar facet block x3 (09/08/2020) (4-0) (100/100/60/60)  Therapeutic right lumbar facet RFA x1 (01/07/2020) (4-0) (100/100/90/>75)  Therapeutic left lumbar facet RFA x1 (02/04/2020) (4-0) (100/100/90/>75)  Therapeutic right hip joint injection x2 (08/20/2020) (5-1) (100/100/80/80)  Therapeutic left hip joint injection x1 (08/20/2020) (5-1) (100/100/80/80)    Therapeutic  Palliative (PRN) options:   Palliative bilateral lumbar facet block #4  Palliative right lumbar facet RFA #2 (last done 01/07/2020) Palliative left lumbar facet RFA #2 (last done 02/04/2020)      Recent Visits Date Type Provider Dept  02/16/21 Procedure visit Milinda Pointer, MD  [facility]  01/27/21 Office Visit Milinda Pointer, MD [facility]  Showing recent visits within past 90 days and meeting all other requirements Today's Visits Date Type Provider Dept  03/02/21 Telemedicine Milinda Pointer, MD [facility]  Showing today's visits and meeting all other requirements Future Appointments Date Type Provider Dept  04/21/21 Appointment Milinda Pointer, MD [facility]  Showing future appointments within next 90 days and meeting all other requirements I discussed the assessment and treatment plan with the patient. The patient was provided an opportunity to ask questions and all were answered. The patient agreed with the plan and demonstrated an understanding of the instructions.  Patient advised to call back or seek an in-person evaluation if the symptoms or condition worsens.  Duration of encounter: 12 minutes.  Note by: Denise Cola, MD Date: 03/02/2021; Time: 12:55 PM

## 2021-03-02 ENCOUNTER — Ambulatory Visit: Payer: BC Managed Care – PPO | Attending: Pain Medicine | Admitting: Pain Medicine

## 2021-03-02 ENCOUNTER — Other Ambulatory Visit: Payer: Self-pay

## 2021-03-02 DIAGNOSIS — Z79899 Other long term (current) drug therapy: Secondary | ICD-10-CM

## 2021-03-02 DIAGNOSIS — M7061 Trochanteric bursitis, right hip: Secondary | ICD-10-CM | POA: Diagnosis not present

## 2021-03-02 DIAGNOSIS — M25551 Pain in right hip: Secondary | ICD-10-CM | POA: Diagnosis not present

## 2021-03-02 DIAGNOSIS — G894 Chronic pain syndrome: Secondary | ICD-10-CM

## 2021-03-02 DIAGNOSIS — M25552 Pain in left hip: Secondary | ICD-10-CM

## 2021-03-02 DIAGNOSIS — M545 Low back pain, unspecified: Secondary | ICD-10-CM

## 2021-03-02 DIAGNOSIS — Z79891 Long term (current) use of opiate analgesic: Secondary | ICD-10-CM

## 2021-03-02 DIAGNOSIS — G8929 Other chronic pain: Secondary | ICD-10-CM

## 2021-03-02 DIAGNOSIS — M76892 Other specified enthesopathies of left lower limb, excluding foot: Secondary | ICD-10-CM

## 2021-03-02 DIAGNOSIS — M1611 Unilateral primary osteoarthritis, right hip: Secondary | ICD-10-CM | POA: Diagnosis not present

## 2021-03-02 DIAGNOSIS — M533 Sacrococcygeal disorders, not elsewhere classified: Secondary | ICD-10-CM

## 2021-03-02 DIAGNOSIS — M7062 Trochanteric bursitis, left hip: Secondary | ICD-10-CM

## 2021-03-08 ENCOUNTER — Other Ambulatory Visit: Payer: Self-pay | Admitting: Pain Medicine

## 2021-03-08 ENCOUNTER — Other Ambulatory Visit: Payer: Self-pay

## 2021-03-08 DIAGNOSIS — Z79899 Other long term (current) drug therapy: Secondary | ICD-10-CM

## 2021-03-08 DIAGNOSIS — G8929 Other chronic pain: Secondary | ICD-10-CM

## 2021-03-08 DIAGNOSIS — Z79891 Long term (current) use of opiate analgesic: Secondary | ICD-10-CM

## 2021-03-08 DIAGNOSIS — G894 Chronic pain syndrome: Secondary | ICD-10-CM

## 2021-03-08 DIAGNOSIS — M533 Sacrococcygeal disorders, not elsewhere classified: Secondary | ICD-10-CM

## 2021-03-30 ENCOUNTER — Ambulatory Visit (INDEPENDENT_AMBULATORY_CARE_PROVIDER_SITE_OTHER): Payer: BC Managed Care – PPO | Admitting: Nurse Practitioner

## 2021-03-30 ENCOUNTER — Encounter: Payer: Self-pay | Admitting: Nurse Practitioner

## 2021-03-30 ENCOUNTER — Other Ambulatory Visit: Payer: Self-pay

## 2021-03-30 VITALS — BP 135/85 | HR 72 | Temp 98.3°F | Ht 64.0 in | Wt 191.0 lb

## 2021-03-30 DIAGNOSIS — N390 Urinary tract infection, site not specified: Secondary | ICD-10-CM | POA: Diagnosis not present

## 2021-03-30 DIAGNOSIS — R319 Hematuria, unspecified: Secondary | ICD-10-CM

## 2021-03-30 DIAGNOSIS — R300 Dysuria: Secondary | ICD-10-CM

## 2021-03-30 LAB — POCT URINALYSIS DIPSTICK
Bilirubin, UA: NEGATIVE
Glucose, UA: NEGATIVE
Ketones, UA: NEGATIVE
Nitrite, UA: NEGATIVE
Protein, UA: NEGATIVE
Spec Grav, UA: 1.025 (ref 1.010–1.025)
Urobilinogen, UA: 0.2 E.U./dL
pH, UA: 6 (ref 5.0–8.0)

## 2021-03-30 MED ORDER — CEPHALEXIN 500 MG PO CAPS
500.0000 mg | ORAL_CAPSULE | Freq: Three times a day (TID) | ORAL | 0 refills | Status: DC
Start: 2021-03-30 — End: 2021-04-21
  Filled 2021-03-30: qty 21, 7d supply, fill #0

## 2021-03-30 MED ORDER — PHENAZOPYRIDINE HCL 100 MG PO TABS
200.0000 mg | ORAL_TABLET | Freq: Three times a day (TID) | ORAL | 0 refills | Status: DC | PRN
  Filled 2021-03-30: qty 20, 4d supply, fill #0

## 2021-03-30 MED FILL — Fludrocortisone Acetate Tab 0.1 MG: ORAL | 90 days supply | Qty: 45 | Fill #1 | Status: AC

## 2021-03-30 NOTE — Progress Notes (Signed)
Acute Office Visit  Subjective:    Patient ID: Denise Burns, female    DOB: 1971/06/09, 50 y.o.   MRN: [id number]  Chief Complaint  Patient presents with   Acute Visit   Urinary Tract Infection    Dysuria  This is a new problem. The current episode started in the past 7 days. The problem occurs every urination. The problem has been gradually worsening. The quality of the pain is described as burning. There has been no fever. She is Sexually active. There is A history of pyelonephritis. Associated symptoms include flank pain, frequency, hematuria and urgency. Pertinent negatives include no chills, nausea or vomiting. She has tried increased fluids for the symptoms. The treatment provided no relief. Her past medical history is significant for kidney stones, recurrent UTIs and a urological procedure. The patient does have a history of multiple kidney stones including staghorn calculus in both kidneys.  She does see urology.    Past Medical History:  Diagnosis Date   Addison disease ([place])    Arthritis    Back pain    Complication of anesthesia    nausea   GERD (gastroesophageal reflux disease)     Past Surgical History:  Procedure Laterality Date   CHOLECYSTECTOMY  2010   dental work     DIAGNOSTIC LAPAROSCOPY  2004   endometriosis dx   DILATION AND CURETTAGE OF UTERUS  2010   IR NEPHROSTOMY PLACEMENT LEFT  12/14/2020   NEPHROLITHOTOMY Left 12/14/2020   Procedure: NEPHROLITHOTOMY PERCUTANEOUS;  Surgeon: Hollice Espy, MD;  Location: [facility];  Service: Urology;  Laterality: Left;   POLYPECTOMY     WISDOM TOOTH EXTRACTION      Family History  Problem Relation Age of Onset   Arthritis Mother    Hyperlipidemia Mother    Hypertension Mother    High blood pressure Mother    Alcohol abuse Father    Hearing loss Father    Hyperlipidemia Father    High blood pressure Father    Colon polyps Maternal Grandmother 83   Colon cancer Neg Hx    Esophageal cancer Neg Hx     Stomach cancer Neg Hx    Rectal cancer Neg Hx     Social History   Socioeconomic History   Marital status: Married    Spouse name: Legrand Como   Number of children: Not on file   Years of education: Not on file   Highest education level: Not on file  Occupational History   Not on file  Tobacco Use   Smoking status: Former    Types: Cigarettes    Quit date: 08/01/1994    Years since quitting: 26.6   Smokeless tobacco: Never  Vaping Use   Vaping Use: Never used  Substance and Sexual Activity   Alcohol use: Not Currently    Comment: once per 6 months   Drug use: Never   Sexual activity: Yes    Partners: Male    Comment: menopause  Other Topics Concern   Not on file  Social History Narrative   Not on file   Social Determinants of Health   Financial Resource Strain: Not on file  Food Insecurity: Not on file  Transportation Needs: Not on file  Physical Activity: Not on file  Stress: Not on file  Social Connections: Not on file  Intimate Partner Violence: Not on file    Outpatient Medications Prior to Visit  Medication Sig Dispense Refill   azelastine (ASTELIN) 0.1 % nasal spray  Place 1 spray into both nostrils 2 (two) times daily.     fludrocortisone (FLORINEF) 0.1 MG tablet TAKE 1/2 TABLET BY MOUTH DAILY (Patient taking differently: Take 0.05 mg by mouth daily.) 45 tablet 2   fluticasone (FLONASE SENSIMIST) 27.5 MCG/SPRAY nasal spray Place 1 spray into the nose in the morning and at bedtime.     HYDROcodone-acetaminophen (NORCO) 7.5-325 MG tablet Take 1 tablet by mouth every 8 (eight) hours as needed for severe pain. Must last 30 days. 90 tablet 0   [START ON 04/06/2021] HYDROcodone-acetaminophen (NORCO) 7.5-325 MG tablet Take 1 tablet by mouth every 8 (eight) hours as needed for severe pain. Must last 30 days. 90 tablet 0   hydrocortisone (CORTEF) 10 MG tablet Take 1 and 1/2 tablets by mouth daily with breakfast and 1/2 tablet daily in the evening 180 tablet 1   meloxicam  (MOBIC) 15 MG tablet TAKE 1 TABLET BY MOUTH DAILY 90 tablet 0   PEG-KCl-NaCl-NaSulf-Na Asc-C (PLENVU) 140 g SOLR Take 1 kit by mouth as directed. Manufacturer's coupon Universal coupon code:BIN: P2366821; GROUP: [id number]; PCN: [id number]; ID: [id number]; PAY NO MORE $50; NO prior authorization 1 each 0   pseudoephedrine (SUDAFED) 30 MG tablet Take 30 mg by mouth every 12 (twelve) hours as needed for congestion.      tiZANidine (ZANAFLEX) 4 MG tablet Take 1 tablet (4 mg total) by mouth every 8 (eight) hours as needed for muscle spasms. 90 tablet 2   HYDROcodone-acetaminophen (NORCO) 7.5-325 MG tablet Take 1 tablet by mouth every 8 (eight) hours as needed for severe pain. Must last 30 days. 90 tablet 0   No facility-administered medications prior to visit.    Allergies  Allergen Reactions   Lisinopril Cough    Other reaction(s): rash   Sulfacetamide Sodium (Acne)     Other reaction(s): sores   Tramadol Other (See Comments)    Other reaction(s): muscle tension Other reaction(s): muscle tension   Sulfa Antibiotics Other (See Comments)    Blisters in mouth     Review of Systems  Constitutional:  Positive for fatigue. Negative for activity change, appetite change, chills and fever.  HENT: Negative.    Respiratory:  Negative for chest tightness, shortness of breath and wheezing.   Gastrointestinal:  Negative for diarrhea, nausea and vomiting.  Endocrine: Negative.   Genitourinary:  Positive for dysuria, flank pain, frequency, hematuria, pelvic pain and urgency.  Musculoskeletal:  Positive for back pain and myalgias.  Allergic/Immunologic: Negative.   Hematological: Negative.   Psychiatric/Behavioral:  Negative for dysphoric mood. The patient is not nervous/anxious.       Objective:    Physical Exam Vitals and nursing note reviewed.  Constitutional:      Appearance: Normal appearance. She is well-developed. She is ill-appearing.  HENT:     Head: Normocephalic and atraumatic.  Eyes:      Pupils: Pupils are equal, round, and reactive to light.  Cardiovascular:     Rate and Rhythm: Normal rate and regular rhythm.     Pulses: Normal pulses.     Heart sounds: Normal heart sounds.  Pulmonary:     Effort: Pulmonary effort is normal.     Breath sounds: Normal breath sounds.  Abdominal:     Palpations: Abdomen is soft.     Tenderness: There is abdominal tenderness.     Comments: Mild pelvic tenderness with palpation.  Genitourinary:    Comments: Urine sample provided today that is positive for small white blood cells and trace of  blood. Musculoskeletal:        General: Normal range of motion.     Cervical back: Normal range of motion and neck supple.  Lymphadenopathy:     Cervical: No cervical adenopathy.  Skin:    General: Skin is warm and dry.     Capillary Refill: Capillary refill takes less than 2 seconds.  Neurological:     General: No focal deficit present.     Mental Status: She is alert and oriented to person, place, and time.  Psychiatric:        Mood and Affect: Mood normal.        Behavior: Behavior normal.        Thought Content: Thought content normal.        Judgment: Judgment normal.   Today's Vitals   03/30/21 0827  BP: 135/85  Pulse: 72  Temp: 98.3 F (36.8 C)  SpO2: 98%  Weight: 191 lb (86.6 kg)  Height: 5' 4"  (1.626 m)   Body mass index is 32.79 kg/m.  Wt Readings from Last 3 Encounters:  03/30/21 191 lb (86.6 kg)  02/16/21 180 lb (81.6 kg)  02/16/21 180 lb (81.6 kg)    Health Maintenance Due  Topic Date Due   Hepatitis C Screening  Never done   COLONOSCOPY (Pts 45-33yr Insurance coverage will need to be confirmed)  Never done   COVID-19 Vaccine (4 - Booster for Moderna series) 11/23/2020   INFLUENZA VACCINE  03/01/2021    There are no preventive care reminders to display for this patient.   Lab Results  Component Value Date   TSH 1.13 08/13/2020   Lab Results  Component Value Date   WBC 10.1 12/14/2020   HGB 11.8 (L)  12/15/2020   HCT 34.6 (L) 12/15/2020   MCV 92.9 12/14/2020   PLT 223 12/14/2020   Lab Results  Component Value Date   NA 138 12/15/2020   K 4.0 12/15/2020   CO2 24 12/15/2020   GLUCOSE 128 (H) 12/15/2020   BUN 12 12/15/2020   CREATININE 0.74 12/15/2020   BILITOT 1.9 (H) 10/06/2020   ALKPHOS 68 10/06/2020   AST 38 10/06/2020   ALT 34 10/06/2020   PROT 6.8 10/06/2020   ALBUMIN 3.8 10/06/2020   CALCIUM 8.0 (L) 12/15/2020   ANIONGAP 7 12/15/2020   GFR 72.11 08/13/2019   Lab Results  Component Value Date   CHOL 244 (H) 04/30/2018   Lab Results  Component Value Date   HDL 86.70 04/30/2018   Lab Results  Component Value Date   LDLCALC 144 (H) 04/30/2018   Lab Results  Component Value Date   TRIG 65.0 04/30/2018   Lab Results  Component Value Date   CHOLHDL 3 04/30/2018   Lab Results  Component Value Date   HGBA1C 6.1 04/30/2018       Assessment & Plan:  1. Urinary tract infection with hematuria, site unspecified Start cephalexin 500 mg capsules 3 times daily for next 7 days.  Send urine for culture and sensitivity and adjust antibiotic treatment as indicated.  She should follow-up with urology as scheduled. - cephALEXin (KEFLEX) 500 MG capsule; Take 1 capsule (500 mg total) by mouth 3 (three) times daily.  Dispense: 21 capsule; Refill: 0  2. Dysuria Will treat as UTI.  Add Pyridium 200 mg tablets which may be taken up to 3 times daily as needed for bladder pain and spasms. - POCT urinalysis dipstick - Urine Culture - phenazopyridine (PYRIDIUM) 200 MG tablet; Take 1  tablet (200 mg total) by mouth 3 (three) times daily as needed for pain.  Dispense: 10 tablet; Refill: 0   Problem List Items Addressed This Visit       Genitourinary   Urinary tract infection with hematuria - Primary   Relevant Medications   cephALEXin (KEFLEX) 500 MG capsule   phenazopyridine (PYRIDIUM) 200 MG tablet     Other   Dysuria   Relevant Medications   phenazopyridine (PYRIDIUM)  200 MG tablet   Other Relevant Orders   POCT urinalysis dipstick (Completed)   Urine Culture     Meds ordered this encounter  Medications   cephALEXin (KEFLEX) 500 MG capsule    Sig: Take 1 capsule (500 mg total) by mouth 3 (three) times daily.    Dispense:  21 capsule    Refill:  0    Order Specific Question:   Supervising Provider    Answer:   Beatrice Lecher D [[id number]]   phenazopyridine (PYRIDIUM) 200 MG tablet    Sig: Take 1 tablet (200 mg total) by mouth 3 (three) times daily as needed for pain.    Dispense:  10 tablet    Refill:  0    Order Specific Question:   Supervising Provider    Answer:   Beatrice Lecher D [[id number]]   This note was dictated using Dragon Voice Recognition Software. Rapid proofreading was performed to expedite the delivery of the information. Despite proofreading, phonetic errors will occur which are common with this voice recognition software. Please take this into consideration. If there are any concerns, please contact our office.     [name], NP

## 2021-04-02 LAB — URINE CULTURE

## 2021-04-02 NOTE — Progress Notes (Signed)
Patient treated with cephalexin at time of visit. She does see urology

## 2021-04-06 ENCOUNTER — Other Ambulatory Visit: Payer: Self-pay

## 2021-04-07 ENCOUNTER — Telehealth: Payer: PRIVATE HEALTH INSURANCE | Attending: Neurology

## 2021-04-07 DIAGNOSIS — G43009 Migraine without aura, not intractable, without status migrainosus: Secondary | ICD-10-CM

## 2021-04-07 DIAGNOSIS — G4733 Obstructive sleep apnea (adult) (pediatric): Secondary | ICD-10-CM

## 2021-04-07 NOTE — Progress Notes
NEUROLOGY VIDEO-TELEMEDICINE FOLLOW UP    Patient Consent to Telehealth Questionnaire   [facility] TELEHEALTH PRECHECKIN QUESTIONS 04/06/2021   By clicking ''I Agree'', I consent to the below:  I Agree   - I agree  to be treated via a video visit and acknowledge that I may be liable for any relevant copays or coinsurance depending on my insurance plan.  - I understand that this video visit is offered for my convenience and I am able to cancel and reschedule for an in-person appointment if I desire.  - I also acknowledge that sensitive medical information may be discussed during this video visit appointment and that it is my responsibility to locate myself in a location that ensures privacy to my own level of comfort.  - I also acknowledge that I should not be participating in a video visit in a way that could cause danger to myself or to those around me (such as driving or walking).  If my provider is concerned about my safety, I understand that they have the right to terminate the visit.     Subjective:    CC: ''F/U Headaches.''    HISTORY OF PRESENT ILLNESS:  Sarah Martin is a 50 y.o. female h/o OSA, depression and anxiety, presenting for neurological evaluation using video telemedicine to prevent contagion with Covid. Patient had recurrent episodes of cephalalgia interrupting her sleep for approximately 1 year which abruptly stopped after starting CPAP. Patient has been well and in fact, can not remember when she had last headache. She is taking magnesium. Patient used ubrogepant once but it was not effective and caused side effects. It was stopped.     REVIEW OF SYSTEMS:  The 14-Point Review of systems check list was completed by the patient, and a copy was consigned to the chart. Constitutional: Negative. GI: Negative. GU: Negative. Musculoskeletal: Negative. Cardiac: Negative. Pulmonary: Negative. Endocrine: Negative. Hematologic/Lymphatic: Negative. Eye: negative. ENT: Negative. Psychiatric: Negative. Neurologic: As above. No gait unsteadiness, visual changes, paresthesia, seizures, speech difficulties, or memory problems. Skin: negative.    PAST MEDICAL HISTORY:  I have reviewed the patient's medical history in detail and updated the computerized patient record. No past medical history on file.   No past surgical history on file.    MEDICATIONS:  Outpatient Medications Prior to Visit   Medication Sig   ? Ubrogepant (UBRELVY) 50 MG TABS Take 1 tablet (50 mg total) by mouth as needed for (migraine, may repeat x 1).     No facility-administered medications prior to visit.     ALLERGIES:  Not on File    SOCIAL HISTORY:  Social History     Socioeconomic History   ? Marital status: Married     FAMILY HISTORY:   No family history on file.      Objective:    Physical Exam:  There were no vitals filed for this visit.    General: Well-nourished female, in no apparent distress.     HEENT: Normocephalic and atraumatic. Normal conjunctivae and sclerae. Normal oropharynx.     Neck: Supple, with full range of motion.    Thorax: Auscultation could not be performed.    Abdomen: Soft, nontender, and nondistended.     Extremities: No clubbing, cyanosis, rash, or edema.     Patient was fully alert and oriented to person, place, time and circumstance.     Affect was appropriate.     General fund of knowledge was appropriate.     Registration was 3/3 and delayed recall  after 5 minutes, 3/3.     Speech was fluent; able to name, repeat, and follow 3-step commands.       Cranial Nerve Examination: Extraocular muscles were intact. PERRLA bilaterally. Visual fields were full to confrontation bilaterally. Face was symmetric. Sensory examination of the face could not be tested. Hearing was grossly intact. Tongue protruded to the midline. Palate was symmetric. Uvula elevated in the midline. Able to shrug shoulders symmetrically.      Motor examination: Normal tone and bulk. No pronator drift. Formal testing of muscle power could not be performed; however, patient was able to move all limbs equally against gravity. No tremor.     Sensory examination, deep tendon reflexes and plantar response could not be tested. Patient denied paresthesia at the time of the interview.     Coordination: Normal finger to nose test bilaterally.      Stance and gait were unremarkable. Excellent stability. Romberg test was negative.     LABORATORY DATA:  No new lab tests    NEUROIMAGING DATA:  No neuroimaging tests    IMPRESSION:  In summary, Sarah Martin is a 50 y.o. female presenting with recurrent episodes of cephalalgia disrupting her sleep. These episodes subsided after starting CPAP, indicating that oxygen desaturation at night was the trigger of most headaches. The neurological exam and neuroimaging are unremarkable. I recommended using CPAP every night. I also recommended prophylactic treatment with magnesium oxide 400 mg PO every day and riboflavin 100 mg PO every day which can be obtained over the counter. Patient can return to clinic if deemed necessary.  ?  PLAN:  1. Continue CPAP   2. Continue magnesium 400 mg PO at bedtime   3. Use Advil as rescue strategy  4. return to clinic if needed  ?  This was a high-complexity neurological evaluation. Patient has a risk of neurological deterioration. I spent 40 minutes in face-to-face time, providing counseling about migraine/OSA, and coordinating future care. All questions were answered to patient's satisfaction.   ?

## 2021-04-08 ENCOUNTER — Other Ambulatory Visit: Payer: Self-pay | Admitting: Urology

## 2021-04-08 DIAGNOSIS — N200 Calculus of kidney: Secondary | ICD-10-CM

## 2021-04-08 NOTE — Progress Notes (Signed)
Spoke with Denise Burns at dr Landry Mellow office and made aware pt not returning phone calls, pt earlier had stated mother has cancer and will need to reschedule surgery

## 2021-04-14 ENCOUNTER — Ambulatory Visit (HOSPITAL_BASED_OUTPATIENT_CLINIC_OR_DEPARTMENT_OTHER)
Admission: RE | Admit: 2021-04-14 | Payer: BC Managed Care – PPO | Source: Home / Self Care | Admitting: Obstetrics and Gynecology

## 2021-04-14 DIAGNOSIS — H1045 Other chronic allergic conjunctivitis: Secondary | ICD-10-CM | POA: Diagnosis not present

## 2021-04-14 DIAGNOSIS — R0602 Shortness of breath: Secondary | ICD-10-CM | POA: Diagnosis not present

## 2021-04-14 DIAGNOSIS — J3089 Other allergic rhinitis: Secondary | ICD-10-CM | POA: Diagnosis not present

## 2021-04-14 DIAGNOSIS — J342 Deviated nasal septum: Secondary | ICD-10-CM | POA: Diagnosis not present

## 2021-04-14 SURGERY — XI ROBOTIC ASSISTED LAPAROSCOPIC HYSTERECTOMY AND SALPINGECTOMY
Anesthesia: Choice | Laterality: Bilateral

## 2021-04-15 ENCOUNTER — Other Ambulatory Visit: Payer: Self-pay

## 2021-04-15 MED ORDER — CEFDINIR 300 MG PO CAPS
ORAL_CAPSULE | ORAL | 0 refills | Status: DC
  Filled 2021-04-15: qty 20, 10d supply, fill #0

## 2021-04-17 NOTE — Progress Notes (Signed)
PROVIDER NOTE: Information contained herein reflects review and annotations entered in association with encounter. Interpretation of such information and data should be left to medically-trained personnel. Information provided to patient can be located elsewhere in the medical record under "Patient Instructions". Document created using STT-dictation technology, any transcriptional errors that may result from process are unintentional.    Patient: Denise Burns  Service Category: E/M  Provider: [name], MD  DOB: 12-May-1971  DOS: 04/21/2021  Specialty: Interventional Pain Management  MRN: [id number]  Setting: Ambulatory outpatient  PCP: [name], NP  Type: Established Patient    Referring Provider: [name], NP  Location: Office  Delivery: Face-to-face     HPI  Ms. Junia Nygren, a 50 y.o. year old female, is here today because of her Chronic hip pain, bilateral [M25.551, M25.552, G89.29]. Ms. Satchell's primary complain today is Hip Pain (bilateral) Last encounter: My last encounter with her was on 03/08/2021. Pertinent problems: Ms. Recine has Chronic midline low back pain with bilateral sciatica; Chronic low back pain (1ry area of Pain) (Bilateral) (R>L) w/o sciatica; Chronic sacroiliac joint pain (2ry area of Pain) (Right); Chronic pain syndrome; Abnormal MRI, lumbar spine (03/22/2018); Lumbar facet hypertrophy (Multilevel) (Bilateral); Lumbar facet syndrome (Bilateral); DDD (degenerative disc disease), lumbosacral; Spondylolisthesis (8 mm) at L5-S1 level; Lumbar Grade 1 Retrolisthesis (2 mm) of L2/L3; L5-S1 pars defect w/ spondylolisthesis (Bilateral); Lumbar pars defect (L5-S1) (Bilateral); Lumbar foraminal stenosis (Left: L2-3) (Bilateral: L5-S1); Osteoarthritis of sacroiliac joint (Bilateral); Chronic musculoskeletal pain; Neurogenic pain; Osteoarthritis of facet joint of lumbar spine; Greater trochanteric bursitis (Bilateral); Lumbar spondylosis; Spondylosis  without myelopathy or radiculopathy, lumbosacral region; Chronic hip pain (Right); Osteoarthritis of hip (Right); Lumbar radiculitis (L1/L2) (Right); Chronic groin pain (Right); Chronic hip pain (Bilateral); Enthesopathy hip region (Left); Osteoarthritis of hips (Bilateral); Kissing spine syndrome; Chronic low back pain (Midline) w/o sciatica; and Trigger point with back pain (PSIS area) (Right) on their pertinent problem list. Pain Assessment: Severity of Chronic pain is reported as a 4 /10. Location: Hip Right, Left/radiates occasionally down right leg to mid thigh in the front. Onset: More than a month ago. Quality: Aching. Timing: Constant. Modifying factor(s): rest, meds. Vitals:  height is 5' 4" (1.626 m) and weight is 180 lb (81.6 kg). Her temperature is 96.9 F (36.1 C) (abnormal). Her blood pressure is 140/98 (abnormal) and her pulse is 94. Her respiration is 16 and oxygen saturation is 100%.   Reason for encounter: medication management.   The patient indicates doing well with the current medication regimen. No adverse reactions or side effects reported to the medications.   She comes in today indicating some increase in her hip pain.  She also describes having some pain in the right lower back.  Physical exam today was negative for reproduction of the pain on hyperextension on rotation maneuver.  However, she did have exquisite tenderness to palpation over the right PSIS area.  Patrick maneuver was positive bilaterally for hip joint arthralgia with bilateral decreased range of motion.  The patient also had exquisite tenderness to palpation over the greater trochanteric bursa, bilaterally.  The patient was negative for sacroiliac joint arthralgia, bilaterally.  Based on the patient's description of the pain and physical exam, it would seem that she is currently having pain coming from both hip joints as well as both the trochanteric bursa's.  She also has an active trigger point around the right  PSIS area (erector spinae muscle and quadratus lumborum muscle.  Today I  will take care of her medication refills and I will be scheduling her to return for a bilateral trochanteric bursa and intra-articular hip joint injection + a right PSIS trigger point injection (myoneural block).  RTCB: 08/04/2021 Nonopioids transferred 05/11/2020: Baclofen and Mobic  Pharmacotherapy Assessment  Analgesic: Hydrocodone/APAP 7.5/325 1 tablet p.o. 3 times daily (22.5 mg/day of hydrocodone)  MME/day: 22.5 mg/day.   Monitoring: [place] PMP: PDMP reviewed during this encounter.       Pharmacotherapy: No side-effects or adverse reactions reported. Compliance: No problems identified. Effectiveness: Clinically acceptable.  Dewayne Shorter, RN  04/21/2021  8:13 AM  Sign when Signing Visit Nursing Pain Medication Assessment:  Safety precautions to be maintained throughout the outpatient stay will include: orient to surroundings, keep bed in low position, maintain call bell within reach at all times, provide assistance with transfer out of bed and ambulation.  Medication Inspection Compliance: Pill count conducted under aseptic conditions, in front of the patient. Neither the pills nor the bottle was removed from the patient's sight at any time. Once count was completed pills were immediately returned to the patient in their original bottle.  Medication: Hydrocodone/APAP Pill/Patch Count:  44 of 90 pills remain Pill/Patch Appearance: Markings consistent with prescribed medication Bottle Appearance: Standard pharmacy container. Clearly labeled. Filled Date: 09 / 06 / 2022 Last Medication intake:  Yesterday    UDS:  Summary  Date Value Ref Range Status  01/27/2021 Note  Final    Comment:    ==================================================================== [employer] [facility] 13 (MW) ==================================================================== Test                             Result       Flag        Units  Drug Present and Declared for Prescription Verification   Hydrocodone                    544          EXPECTED   ng/mg creat   Hydromorphone                  282          EXPECTED   ng/mg creat   Dihydrocodeine                 100          EXPECTED   ng/mg creat   Norhydrocodone                 735          EXPECTED   ng/mg creat    Sources of hydrocodone include scheduled prescription medications.    Hydromorphone, dihydrocodeine and norhydrocodone are expected    metabolites of hydrocodone. Hydromorphone and dihydrocodeine are    also available as scheduled prescription medications.  ==================================================================== Test                      Result    Flag   Units      Ref Range   Creatinine              171              mg/dL      >=20 ==================================================================== Declared Medications:  The flagging and interpretation on this report are based on the  following declared medications.  Unexpected results may arise from  inaccuracies in the declared medications.   **  Note: The testing scope of this panel includes these medications:   Hydrocodone (Norco)   **Note: The testing scope of this panel does not include the  following reported medications:   Acetaminophen (Norco)  Azelastine  Fludrocortisone (Florinef)  Fluticasone (Flonase)  Hydrocortisone (Cortef)  Meloxicam  Polyethylene Glycol (Plenvu)  Promethazine  Pseudoephedrine (Sudafed)  Tizanidine ==================================================================== For clinical consultation, please call [phone]. ====================================================================      ROS  Constitutional: Denies any fever or chills Gastrointestinal: No reported hemesis, hematochezia, vomiting, or acute GI distress Musculoskeletal: Denies any acute onset joint swelling, redness, loss of ROM, or weakness Neurological: No reported  episodes of acute onset apraxia, aphasia, dysarthria, agnosia, amnesia, paralysis, loss of coordination, or loss of consciousness  Medication Review  HYDROcodone-acetaminophen, PEG-KCl-NaCl-NaSulf-Na Asc-C, azelastine, cefdinir, diazepam, fludrocortisone, fluticasone, hydrocortisone, meloxicam, phenazopyridine, pseudoephedrine, and tiZANidine  History Review  Allergy: Ms. Curvin is allergic to lisinopril, sulfacetamide sodium (acne), tramadol, and sulfa antibiotics. Drug: Ms. West  reports no history of drug use. Alcohol:  reports that she does not currently use alcohol. Tobacco:  reports that she quit smoking about 26 years ago. Her smoking use included cigarettes. She has never used smokeless tobacco. Social: Ms. Dobosz  reports that she quit smoking about 26 years ago. Her smoking use included cigarettes. She has never used smokeless tobacco. She reports that she does not currently use alcohol. She reports that she does not use drugs. Medical:  has a past medical history of Addison disease ([place]), Arthritis, Back pain, Complication of anesthesia, and GERD (gastroesophageal reflux disease). Surgical: Ms. Dicostanzo  has a past surgical history that includes Cholecystectomy (2010); Polypectomy; dental work; Dilation and curettage of uterus (2010); IR NEPHROSTOMY PLACEMENT LEFT (12/14/2020); Nephrolithotomy (Left, 12/14/2020); Wisdom tooth extraction; and Diagnostic laparoscopy (2004). Family: family history includes Alcohol abuse in her father; Arthritis in her mother; Colon polyps (age of onset: 78) in her maternal grandmother; Hearing loss in her father; High blood pressure in her father and mother; Hyperlipidemia in her father and mother; Hypertension in her mother.  Laboratory Chemistry Profile   Renal Lab Results  Component Value Date   BUN 12 12/15/2020   CREATININE 0.74 33/35/4562   BCR NOT APPLICABLE 56/38/9373   GFR 72.11 08/13/2019   GFRAA 86 08/16/2018   GFRNONAA >60  12/15/2020    Hepatic Lab Results  Component Value Date   AST 38 10/06/2020   ALT 34 10/06/2020   ALBUMIN 3.8 10/06/2020   ALKPHOS 68 10/06/2020   LIPASE 30 10/06/2020    Electrolytes Lab Results  Component Value Date   NA 138 12/15/2020   K 4.0 12/15/2020   CL 107 12/15/2020   CALCIUM 8.0 (L) 12/15/2020   MG 2.1 08/16/2018    Bone Lab Results  Component Value Date   VD25OH 13.55 (L) 04/30/2018   25OHVITD1 67 08/16/2018   25OHVITD2 <1.0 08/16/2018   25OHVITD3 67 08/16/2018    Inflammation (CRP: Acute Phase) (ESR: Chronic Phase) Lab Results  Component Value Date   CRP 2 08/16/2018   ESRSEDRATE 37 (H) 08/16/2018   LATICACIDVEN 1.4 10/06/2020         Note: Above Lab results reviewed.  Recent Imaging Review  DG PAIN CLINIC C-ARM 1-60 MIN NO REPORT Fluoro was used, but no Radiologist interpretation will be provided.  Please refer to "NOTES" tab for provider progress note. Note: Reviewed        Physical Exam  General appearance: Well nourished, well developed, and well hydrated. In no apparent acute distress  Mental status: Alert, oriented x 3 (person, place, & time)       Respiratory: No evidence of acute respiratory distress Eyes: PERLA Vitals: BP (!) 140/98 (BP Location: Right Arm, Patient Position: Sitting, Cuff Size: Normal)   Pulse 94   Temp (!) 96.9 F (36.1 C)   Resp 16   Ht 5' 4" (1.626 m)   Wt 180 lb (81.6 kg)   LMP 12/01/2020 (Approximate)   SpO2 100%   BMI 30.90 kg/m  BMI: Estimated body mass index is 30.9 kg/m as calculated from the following:   Height as of this encounter: 5' 4" (1.626 m).   Weight as of this encounter: 180 lb (81.6 kg). Ideal: Ideal body weight: 54.7 kg (120 lb 9.5 oz) Adjusted ideal body weight: 65.5 kg (144 lb 5.7 oz)  Assessment   Status Diagnosis  Controlled Controlled Controlled 1. Chronic hip pain (Bilateral)   2. Greater trochanteric bursitis (Bilateral)   3. Chronic low back pain (1ry area of Pain) (Bilateral)  (R>L) w/o sciatica   4. Chronic sacroiliac joint pain (2ry area of Pain) (Right)   5. Lumbar facet syndrome (Bilateral)   6. Chronic pain syndrome   7. Pharmacologic therapy   8. Chronic use of opiate for therapeutic purpose   9. Encounter for medication management   10. Trigger point with back pain (PSIS area) (Right)   11. Anxiety due to invasive procedure      Updated Problems: Problem  Trigger point with back pain (PSIS area) (Right)   Erector spinae muscle and quadratus lumborum muscle on the right side.     Plan of Care  Problem-specific:  No problem-specific Assessment & Plan notes found for this encounter.  Ms. Abigail Teall has a current medication list which includes the following long-term medication(s): azelastine, flonase sensimist, hydrocodone-acetaminophen, [START ON 05/06/2021] hydrocodone-acetaminophen, [START ON 06/05/2021] hydrocodone-acetaminophen, [START ON 07/05/2021] hydrocodone-acetaminophen, and meloxicam.  Pharmacotherapy (Medications Ordered): Meds ordered this encounter  Medications   HYDROcodone-acetaminophen (NORCO) 7.5-325 MG tablet    Sig: Take 1 tablet by mouth every 8 (eight) hours as needed for severe pain. Must last 30 days.    Dispense:  90 tablet    Refill:  0    Not a duplicate. Do NOT delete! Dispense 1 day early if closed on fill date. Warn: no CNS-depressants within 8 hrs of opioid. Do not send refill request. Renewal requires appointment. No partial fills allowed   HYDROcodone-acetaminophen (NORCO) 7.5-325 MG tablet    Sig: Take 1 tablet by mouth every 8 (eight) hours as needed for severe pain. Must last 30 days.    Dispense:  90 tablet    Refill:  0    Not a duplicate. Do NOT delete! Dispense 1 day early if closed on fill date. Warn: no CNS-depressants within 8 hrs of opioid. Do not send refill request. Renewal requires appointment. No partial fills allowed   HYDROcodone-acetaminophen (NORCO) 7.5-325 MG tablet    Sig: Take 1 tablet  by mouth every 8 (eight) hours as needed for severe pain. Must last 30 days.    Dispense:  90 tablet    Refill:  0    Not a duplicate. Do NOT delete! Dispense 1 day early if closed on fill date. Warn: no CNS-depressants within 8 hrs of opioid. Do not send refill request. Renewal requires appointment. No partial fills allowed   diazepam (VALIUM) 5 MG tablet    Sig: Take 1-2 tablets (5-10 mg total) by mouth 60 (sixty) minutes  before procedure for 1 dose. Take one (1) tab (5 mg) 60 minutes before scheduled procedure. Wait 30 minutes. If still anxious, take the second (5 mg) tab 30 min before procedure.    Dispense:  2 tablet    Refill:  0    Must have a driver. Do not drive or operate machinery x 24 hours after taking this medication. Avoid taking within 4 hours of having taken an opioid pain medications.    Orders:  Orders Placed This Encounter  Procedures   HIP INJECTION    Standing Status:   Future    Standing Expiration Date:   07/21/2021    Scheduling Instructions:     Side: Bilateral     Sedation: Oral Valium     Timeframe: As soon as schedule allows   HIP INJECTION    Standing Status:   Future    Standing Expiration Date:   07/21/2021    Scheduling Instructions:     Purpose: Therapeutic/Diagnostic     Indication: Hip pain 2ry to Trochanteric Burlitis bilateral (M70.61, M70.62).     Side: Bilateral     Sedation: Oral Valium     Timeframe: As soon as the schedule permits.   TRIGGER POINT INJECTION    Standing Status:   Future    Standing Expiration Date:   07/21/2021    Scheduling Instructions:     Area: Lower Back     Side: Right     Sedation: No Sedation.     Timeframe: ASAA    Order Specific Question:   Where will this procedure be performed?    Answer:   [facility]   Informed Consent Details: Physician/Practitioner Attestation; Transcribe to consent form and obtain patient signature    Nursing Order: Transcribe to consent form and obtain patient  signature. Note: Always confirm laterality of pain with [name], before procedure.    Order Specific Question:   Physician/Practitioner attestation of informed consent for procedure/surgical case    Answer:   I, the physician/practitioner, attest that I have discussed with the patient the benefits, risks, side effects, alternatives, likelihood of achieving goals and potential problems during recovery for the procedure that I have provided informed consent.    Order Specific Question:   Procedure    Answer:   Hip injection    Order Specific Question:   Physician/Practitioner performing the procedure    Answer:   Francisco A. Dossie Arbour, MD    Order Specific Question:   Indication/Reason    Answer:   Hip Joint Pain (Arthralgia)   Informed Consent Details: Physician/Practitioner Attestation; Transcribe to consent form and obtain patient signature    Note: Always confirm laterality of pain with [name], before procedure. Transcribe to consent form and obtain patient signature.    Order Specific Question:   Physician/Practitioner attestation of informed consent for procedure/surgical case    Answer:   I, the physician/practitioner, attest that I have discussed with the patient the benefits, risks, side effects, alternatives, likelihood of achieving goals and potential problems during recovery for the procedure that I have provided informed consent.    Order Specific Question:   Procedure    Answer:   Hip bursa injection    Order Specific Question:   Physician/Practitioner performing the procedure    Answer:   Francisco A. Dossie Arbour, MD    Order Specific Question:   Indication/Reason    Answer:   Hip bursitis   Informed Consent Details: Physician/Practitioner Attestation; Transcribe to consent form  and obtain patient signature    Provider Attestation: I, [place] Dossie Arbour, MD, (Pain Management Specialist), the physician/practitioner, attest that I have discussed with the patient the benefits,  risks, side effects, alternatives, likelihood of achieving goals and potential problems during recovery for the procedure that I have provided informed consent.    Scheduling Instructions:     Note: Always confirm laterality of pain with [name], before procedure.     Transcribe to consent form and obtain patient signature.    Order Specific Question:   Physician/Practitioner attestation of informed consent for procedure/surgical case    Answer:   I, the physician/practitioner, attest that I have discussed with the patient the benefits, risks, side effects, alternatives, likelihood of achieving goals and potential problems during recovery for the procedure that I have provided informed consent.    Order Specific Question:   Procedure    Answer:   Myoneural Block (Trigger Point injection)    Order Specific Question:   Physician/Practitioner performing the procedure    Answer:   Francisco A. Dossie Arbour MD    Order Specific Question:   Indication/Reason    Answer:   Musculoskeletal pain/myofascial pain secondary to trigger point    Follow-up plan:   Return for (Clinic) procedure: (B) IA Hip & TBI + (R) TPI, ValiumRx-given.     Interventional Therapies  Risk  Complexity Considerations:   Estimated body mass index is 32.1 kg/m as calculated from the following:   Height as of this encounter: 5' 4" (1.626 m).   Weight as of this encounter: 187 lb (84.8 kg). WNL   Planned  Pending:   Diagnostic/therapeutic bilateral trochanteric bursa injection #1    Under consideration:   Diagnostic L2-3 L-FCT BLK #1  Diagnostic bilateral SI joint Blk  Possible bilateral SI joint RFA  Diagnostic bilateral L5 TFESI  Diagnostic left-sided L2 TFESI    Completed:   Therapeutic bilateral lumbar facet block x3 (09/08/2020) (4-0) (100/100/60/60)  Therapeutic right lumbar facet RFA x1 (01/07/2020) (4-0) (100/100/90/>75)  Therapeutic left lumbar facet RFA x1 (02/04/2020) (4-0) (100/100/90/>75)  Therapeutic right  hip joint injection x2 (08/20/2020) (5-1) (100/100/80/80)  Therapeutic left hip joint injection x1 (08/20/2020) (5-1) (100/100/80/80)    Therapeutic  Palliative (PRN) options:   Palliative bilateral lumbar facet block #4  Palliative right lumbar facet RFA #2 (last done 01/07/2020) Palliative left lumbar facet RFA #2 (last done 02/04/2020)    Recent Visits Date Type Provider Dept  03/02/21 Telemedicine Milinda Pointer, MD [facility]  02/16/21 Procedure visit Milinda Pointer, MD [facility]  01/27/21 Office Visit Milinda Pointer, MD [facility]  Showing recent visits within past 90 days and meeting all other requirements Today's Visits Date Type Provider Dept  04/21/21 Office Visit Milinda Pointer, MD [facility]  Showing today's visits and meeting all other requirements Future Appointments Date Type Provider Dept  05/04/21 Appointment Milinda Pointer, MD [facility]  Showing future appointments within next 90 days and meeting all other requirements I discussed the assessment and treatment plan with the patient. The patient was provided an opportunity to ask questions and all were answered. The patient agreed with the plan and demonstrated an understanding of the instructions.  Patient advised to call back or seek an in-person evaluation if the symptoms or condition worsens.  Duration of encounter: 30 minutes.  Note by: [name], MD Date: 04/21/2021; Time: 8:58 AM

## 2021-04-21 ENCOUNTER — Encounter: Payer: Self-pay | Admitting: Pain Medicine

## 2021-04-21 ENCOUNTER — Ambulatory Visit: Payer: BC Managed Care – PPO | Attending: Pain Medicine | Admitting: Pain Medicine

## 2021-04-21 ENCOUNTER — Other Ambulatory Visit: Payer: Self-pay

## 2021-04-21 VITALS — BP 140/98 | HR 94 | Temp 96.9°F | Resp 16 | Ht 64.0 in | Wt 180.0 lb

## 2021-04-21 DIAGNOSIS — M545 Low back pain, unspecified: Secondary | ICD-10-CM | POA: Diagnosis not present

## 2021-04-21 DIAGNOSIS — F419 Anxiety disorder, unspecified: Secondary | ICD-10-CM | POA: Diagnosis not present

## 2021-04-21 DIAGNOSIS — M25552 Pain in left hip: Secondary | ICD-10-CM | POA: Diagnosis not present

## 2021-04-21 DIAGNOSIS — M7061 Trochanteric bursitis, right hip: Secondary | ICD-10-CM | POA: Diagnosis not present

## 2021-04-21 DIAGNOSIS — M25551 Pain in right hip: Secondary | ICD-10-CM | POA: Diagnosis not present

## 2021-04-21 DIAGNOSIS — Z79899 Other long term (current) drug therapy: Secondary | ICD-10-CM | POA: Insufficient documentation

## 2021-04-21 DIAGNOSIS — G8929 Other chronic pain: Secondary | ICD-10-CM

## 2021-04-21 DIAGNOSIS — M549 Dorsalgia, unspecified: Secondary | ICD-10-CM | POA: Insufficient documentation

## 2021-04-21 DIAGNOSIS — M7062 Trochanteric bursitis, left hip: Secondary | ICD-10-CM | POA: Diagnosis not present

## 2021-04-21 DIAGNOSIS — M47816 Spondylosis without myelopathy or radiculopathy, lumbar region: Secondary | ICD-10-CM | POA: Diagnosis not present

## 2021-04-21 DIAGNOSIS — M533 Sacrococcygeal disorders, not elsewhere classified: Secondary | ICD-10-CM

## 2021-04-21 DIAGNOSIS — G894 Chronic pain syndrome: Secondary | ICD-10-CM | POA: Diagnosis not present

## 2021-04-21 DIAGNOSIS — Z79891 Long term (current) use of opiate analgesic: Secondary | ICD-10-CM | POA: Diagnosis not present

## 2021-04-21 MED ORDER — HYDROCODONE-ACETAMINOPHEN 7.5-325 MG PO TABS
1.0000 | ORAL_TABLET | Freq: Three times a day (TID) | ORAL | 0 refills | Status: DC | PRN
Start: 2021-06-04 — End: 2021-07-13
  Filled 2021-06-04 (×2): qty 90, 30d supply, fill #0

## 2021-04-21 MED ORDER — HYDROCODONE-ACETAMINOPHEN 7.5-325 MG PO TABS
1.0000 | ORAL_TABLET | Freq: Three times a day (TID) | ORAL | 0 refills | Status: DC | PRN
  Filled 2021-05-06 (×3): qty 90, 30d supply, fill #0

## 2021-04-21 MED ORDER — DIAZEPAM 5 MG PO TABS
5.0000 mg | ORAL_TABLET | ORAL | 0 refills | Status: DC
Start: 2021-04-21 — End: 2021-08-30
  Filled 2021-04-21: qty 2, 1d supply, fill #0

## 2021-04-21 MED ORDER — HYDROCODONE-ACETAMINOPHEN 7.5-325 MG PO TABS
1.0000 | ORAL_TABLET | Freq: Three times a day (TID) | ORAL | 0 refills | Status: DC | PRN
Start: 2021-07-05 — End: 2021-07-13
  Filled 2021-07-05 (×3): qty 90, 30d supply, fill #0

## 2021-04-21 NOTE — Progress Notes (Signed)
Nursing Pain Medication Assessment:  Safety precautions to be maintained throughout the outpatient stay will include: orient to surroundings, keep bed in low position, maintain call bell within reach at all times, provide assistance with transfer out of bed and ambulation.  Medication Inspection Compliance: Pill count conducted under aseptic conditions, in front of the patient. Neither the pills nor the bottle was removed from the patient's sight at any time. Once count was completed pills were immediately returned to the patient in their original bottle.  Medication: Hydrocodone/APAP Pill/Patch Count:  44 of 90 pills remain Pill/Patch Appearance: Markings consistent with prescribed medication Bottle Appearance: Standard pharmacy container. Clearly labeled. Filled Date: 09 / 06 / 2022 Last Medication intake:  Yesterday

## 2021-04-21 NOTE — Patient Instructions (Addendum)
Instructions for Po valium given.   Preparing for Procedure with Sedation Instructions: Oral Intake: Do not eat or drink anything for at least 8 hours prior to your procedure. Transportation: Public transportation is not allowed. Bring an adult driver. The driver must be physically present in our waiting room before any procedure can be started. Physical Assistance: Bring an adult capable of physically assisting you, in the event you need help. Blood Pressure Medicine: Take your blood pressure medicine with a sip of water the morning of the procedure. Insulin: Take only  of your normal insulin dose. Preventing infections: Shower with an antibacterial soap the morning of your procedure. Build-up your immune system: Take 1000 mg of Vitamin C with every meal (3 times a day) the day prior to your procedure. Pregnancy: If you are pregnant, call and cancel the procedure. Sickness: If you have a cold, fever, or any active infections, call and cancel the procedure. Arrival: You must be in the facility at least 30 minutes prior to your scheduled procedure. Children: Do not bring children with you. Dress appropriately: Bring dark clothing that you would not mind if they get stained. Valuables: Do not bring any jewelry or valuables. Procedure appointments are reserved for interventional treatments only. No Prescription Refills. No medication changes will be discussed during procedure appointments. No disability issues will be discussed.

## 2021-04-22 ENCOUNTER — Other Ambulatory Visit: Payer: Self-pay

## 2021-04-22 DIAGNOSIS — Z0000 Encounter for general adult medical examination without abnormal findings: Secondary | ICD-10-CM

## 2021-04-23 ENCOUNTER — Other Ambulatory Visit: Payer: Self-pay

## 2021-04-23 ENCOUNTER — Other Ambulatory Visit: Payer: BC Managed Care – PPO

## 2021-04-23 ENCOUNTER — Ambulatory Visit: Payer: BC Managed Care – PPO | Admitting: Nurse Practitioner

## 2021-04-23 DIAGNOSIS — Z0000 Encounter for general adult medical examination without abnormal findings: Secondary | ICD-10-CM | POA: Diagnosis not present

## 2021-04-24 LAB — COMPREHENSIVE METABOLIC PANEL
ALT: 27 IU/L (ref 0–32)
AST: 21 IU/L (ref 0–40)
Albumin/Globulin Ratio: 1.9 (ref 1.2–2.2)
Albumin: 4.3 g/dL (ref 3.8–4.8)
Alkaline Phosphatase: 57 IU/L (ref 44–121)
BUN/Creatinine Ratio: 20 (ref 9–23)
BUN: 17 mg/dL (ref 6–24)
Bilirubin Total: 1.2 mg/dL (ref 0.0–1.2)
CO2: 24 mmol/L (ref 20–29)
Calcium: 9.4 mg/dL (ref 8.7–10.2)
Chloride: 104 mmol/L (ref 96–106)
Creatinine, Ser: 0.83 mg/dL (ref 0.57–1.00)
Globulin, Total: 2.3 g/dL (ref 1.5–4.5)
Glucose: 89 mg/dL (ref 65–99)
Potassium: 4 mmol/L (ref 3.5–5.2)
Sodium: 142 mmol/L (ref 134–144)
Total Protein: 6.6 g/dL (ref 6.0–8.5)
eGFR: 86 mL/min/{1.73_m2} (ref >59)

## 2021-04-24 LAB — CBC
Hematocrit: 42.8 % (ref 34.0–46.6)
Hemoglobin: 14.3 g/dL (ref 11.1–15.9)
MCH: 31 pg (ref 26.6–33.0)
MCHC: 33.4 g/dL (ref 31.5–35.7)
MCV: 93 fL (ref 79–97)
Platelets: 184 10*3/uL (ref 150–450)
RBC: 4.62 x10E6/uL (ref 3.77–5.28)
RDW: 13.3 % (ref 11.7–15.4)
WBC: 7 10*3/uL (ref 3.4–10.8)

## 2021-04-24 LAB — LIPID PANEL
Chol/HDL Ratio: 4.4 ratio (ref 0.0–4.4)
Cholesterol, Total: 249 mg/dL — ABNORMAL HIGH (ref 100–199)
HDL: 57 mg/dL (ref >39)
LDL Chol Calc (NIH): 174 mg/dL — ABNORMAL HIGH (ref 0–99)
Triglycerides: 105 mg/dL (ref 0–149)
VLDL Cholesterol Cal: 18 mg/dL (ref 5–40)

## 2021-04-24 LAB — T4, FREE: Free T4: 0.95 ng/dL (ref 0.82–1.77)

## 2021-04-24 LAB — HEMOGLOBIN A1C
Est. average glucose Bld gHb Est-mCnc: 134 mg/dL
Hgb A1c MFr Bld: 6.3 % — ABNORMAL HIGH (ref 4.8–5.6)

## 2021-04-24 LAB — TSH: TSH: 1.74 u[IU]/mL (ref 0.450–4.500)

## 2021-04-26 ENCOUNTER — Other Ambulatory Visit: Payer: Self-pay

## 2021-04-26 NOTE — Progress Notes (Signed)
Mild elevation of lipid panel and HgbAc 6.3. other labs normal. Discuss at next visit 9/30

## 2021-04-29 ENCOUNTER — Ambulatory Visit (INDEPENDENT_AMBULATORY_CARE_PROVIDER_SITE_OTHER): Payer: BC Managed Care – PPO | Admitting: Nurse Practitioner

## 2021-04-29 ENCOUNTER — Other Ambulatory Visit: Payer: Self-pay

## 2021-04-29 ENCOUNTER — Encounter: Payer: Self-pay | Admitting: Nurse Practitioner

## 2021-04-29 VITALS — BP 125/82 | HR 88 | Temp 98.5°F | Ht 64.0 in | Wt 188.8 lb

## 2021-04-29 DIAGNOSIS — R059 Cough, unspecified: Secondary | ICD-10-CM

## 2021-04-29 DIAGNOSIS — J0141 Acute recurrent pansinusitis: Secondary | ICD-10-CM | POA: Diagnosis not present

## 2021-04-29 DIAGNOSIS — R052 Subacute cough: Secondary | ICD-10-CM | POA: Diagnosis not present

## 2021-04-29 MED ORDER — HYDROCOD POLST-CPM POLST ER 10-8 MG/5ML PO SUER
5.0000 mL | Freq: Two times a day (BID) | ORAL | 0 refills | Status: DC | PRN
Start: 2021-04-29 — End: 2021-08-17
  Filled 2021-04-29: qty 115, 12d supply, fill #0

## 2021-04-29 MED ORDER — CLARITHROMYCIN 500 MG PO TABS
500.0000 mg | ORAL_TABLET | Freq: Two times a day (BID) | ORAL | 0 refills | Status: DC
  Filled 2021-04-29: qty 20, 10d supply, fill #0

## 2021-04-29 NOTE — Progress Notes (Signed)
Acute Office Visit  Subjective:    Patient ID: Denise Burns, female    DOB: 1971-02-27, 50 y.o.   MRN: [id number]  Chief Complaint  Patient presents with   Nasal Congestion    Patient did take home test for COVID 19 this morning and results were negative. The patient states that she does have long history of environmental allergies. Saw her allergist on 9/14 and was diagnosed with sinusitis. Was given prescription for [facility] for 7 days. Was feeling well until Saturday.   URI  This is a new problem. The current episode started in the past 7 days. The problem has been gradually worsening. There has been no fever. Associated symptoms include chest pain, congestion, coughing, ear pain, headaches, a plugged ear sensation, rhinorrhea, sinus pain, a sore throat and wheezing. Pertinent negatives include no abdominal pain, diarrhea, dysuria, nausea, rash, sneezing or vomiting. Associated symptoms comments: Patient states that she is wheezing some bt not too much . She has tried antihistamine, decongestant and acetaminophen (has taken robitussin and tesalon perls to help cough, but has not really helped.) for the symptoms. The treatment provided no relief.    Past Medical History:  Diagnosis Date   Addison disease ([place])    Arthritis    Back pain    Complication of anesthesia    nausea   GERD (gastroesophageal reflux disease)     Past Surgical History:  Procedure Laterality Date   CHOLECYSTECTOMY  2010   dental work     DIAGNOSTIC LAPAROSCOPY  2004   endometriosis dx   DILATION AND CURETTAGE OF UTERUS  2010   IR NEPHROSTOMY PLACEMENT LEFT  12/14/2020   NEPHROLITHOTOMY Left 12/14/2020   Procedure: NEPHROLITHOTOMY PERCUTANEOUS;  Surgeon: Hollice Espy, MD;  Location: [facility] ORS;  Service: Urology;  Laterality: Left;   POLYPECTOMY     WISDOM TOOTH EXTRACTION      Family History  Problem Relation Age of Onset   Arthritis Mother    Hyperlipidemia Mother    Hypertension Mother     High blood pressure Mother    Alcohol abuse Father    Hearing loss Father    Hyperlipidemia Father    High blood pressure Father    Colon polyps Maternal Grandmother 75   Colon cancer Neg Hx    Esophageal cancer Neg Hx    Stomach cancer Neg Hx    Rectal cancer Neg Hx     Social History   Socioeconomic History   Marital status: Married    Spouse name: Legrand Como   Number of children: Not on file   Years of education: Not on file   Highest education level: Not on file  Occupational History   Not on file  Tobacco Use   Smoking status: Former    Types: Cigarettes    Quit date: 08/01/1994    Years since quitting: 26.7   Smokeless tobacco: Never  Vaping Use   Vaping Use: Never used  Substance and Sexual Activity   Alcohol use: Not Currently    Comment: once per 6 months   Drug use: Never   Sexual activity: Yes    Partners: Male    Comment: menopause  Other Topics Concern   Not on file  Social History Narrative   Not on file   Social Determinants of Health   Financial Resource Strain: Not on file  Food Insecurity: Not on file  Transportation Needs: Not on file  Physical Activity: Not on file  Stress: Not on  file  Social Connections: Not on file  Intimate Partner Violence: Not on file    Outpatient Medications Prior to Visit  Medication Sig Dispense Refill   azelastine (ASTELIN) 0.1 % nasal spray Place 1 spray into both nostrils 2 (two) times daily.     diazepam (VALIUM) 5 MG tablet Take 1-2 tablets (5-10 mg total) by mouth 60 (sixty) minutes before procedure for 1 dose. Take one (1) tab (5 mg) 60 minutes before scheduled procedure. Wait 30 minutes. If still anxious, take the second (5 mg) tab 30 min before procedure. 2 tablet 0   fludrocortisone (FLORINEF) 0.1 MG tablet TAKE 1/2 TABLET BY MOUTH DAILY (Patient taking differently: Take 0.05 mg by mouth daily.) 45 tablet 2   fluticasone (FLONASE SENSIMIST) 27.5 MCG/SPRAY nasal spray Place 1 spray into the nose in the  morning and at bedtime.     HYDROcodone-acetaminophen (NORCO) 7.5-325 MG tablet Take 1 tablet by mouth every 8 (eight) hours as needed for severe pain. Must last 30 days. 90 tablet 0   HYDROcodone-acetaminophen (NORCO) 7.5-325 MG tablet Take 1 tablet by mouth every 8 (eight) hours as needed for severe pain. Must last 30 days. 90 tablet 0   [START ON 06/05/2021] HYDROcodone-acetaminophen (NORCO) 7.5-325 MG tablet Take 1 tablet by mouth every 8 (eight) hours as needed for severe pain. Must last 30 days. 90 tablet 0   [START ON 07/05/2021] HYDROcodone-acetaminophen (NORCO) 7.5-325 MG tablet Take 1 tablet by mouth every 8 (eight) hours as needed for severe pain. Must last 30 days. 90 tablet 0   hydrocortisone (CORTEF) 10 MG tablet Take 1 and 1/2 tablets by mouth daily with breakfast and 1/2 tablet daily in the evening 180 tablet 1   meloxicam (MOBIC) 15 MG tablet TAKE 1 TABLET BY MOUTH DAILY 90 tablet 0   PEG-KCl-NaCl-NaSulf-Na Asc-C (PLENVU) 140 g SOLR Take 1 kit by mouth as directed. Manufacturer's coupon Universal coupon code:BIN: P2366821; GROUP: [id number]; PCN: [id number]; ID: [id number]; PAY NO MORE $50; NO prior authorization 1 each 0   phenazopyridine (PYRIDIUM) 100 MG tablet Take 2 tablets (200 mg total) by mouth 3 (three) times daily as needed for pain. 20 tablet 0   pseudoephedrine (SUDAFED) 30 MG tablet Take 30 mg by mouth every 12 (twelve) hours as needed for congestion.      tiZANidine (ZANAFLEX) 4 MG tablet Take 1 tablet (4 mg total) by mouth every 8 (eight) hours as needed for muscle spasms. 90 tablet 2   cefdinir (OMNICEF) 300 MG capsule as directed Orally twice daily 10 days 20 capsule 0   No facility-administered medications prior to visit.    Allergies  Allergen Reactions   Lisinopril Cough    Other reaction(s): rash   Sulfacetamide Sodium (Acne)     Other reaction(s): sores   Tramadol Other (See Comments)    Other reaction(s): muscle tension Other reaction(s): muscle tension   Sulfa  Antibiotics Other (See Comments)    Blisters in mouth     Review of Systems  Constitutional:  Positive for appetite change and fatigue. Negative for activity change, chills and fever.       Appetite decreased.   HENT:  Positive for congestion, ear pain, postnasal drip, rhinorrhea, sinus pressure, sinus pain and sore throat. Negative for sneezing.   Eyes: Negative.   Respiratory:  Positive for cough, chest tightness and wheezing. Negative for shortness of breath.   Cardiovascular:  Positive for chest pain. Negative for palpitations.  Gastrointestinal:  Negative for abdominal pain, constipation,  diarrhea, nausea and vomiting.  Endocrine: Negative for cold intolerance, heat intolerance, polydipsia and polyuria.  Genitourinary:  Negative for dyspareunia, dysuria, flank pain, frequency and urgency.  Musculoskeletal:  Negative for arthralgias, back pain and myalgias.  Skin:  Negative for rash.  Allergic/Immunologic: Positive for environmental allergies.  Neurological:  Positive for dizziness and headaches. Negative for weakness.  Hematological:  Negative for adenopathy.  Psychiatric/Behavioral:  The patient is not nervous/anxious.   All other systems reviewed and are negative.     Objective:    Physical Exam Vitals and nursing note reviewed.  Constitutional:      Appearance: Normal appearance. She is well-developed. She is ill-appearing.  HENT:     Head: Normocephalic and atraumatic.     Right Ear: Tympanic membrane is erythematous and bulging.     Left Ear: Tympanic membrane is erythematous and bulging.     Nose: Congestion and rhinorrhea present.     Right Sinus: Maxillary sinus tenderness and frontal sinus tenderness present.     Left Sinus: Maxillary sinus tenderness and frontal sinus tenderness present.     Mouth/Throat:     Pharynx: Posterior oropharyngeal erythema present.  Eyes:     Pupils: Pupils are equal, round, and reactive to light.  Cardiovascular:     Rate and  Rhythm: Normal rate and regular rhythm.     Pulses: Normal pulses.     Heart sounds: Normal heart sounds.  Pulmonary:     Effort: Pulmonary effort is normal.     Breath sounds: Wheezing present.     Comments: Loose, congested, nonproductive cough present during today's visit. Abdominal:     Palpations: Abdomen is soft.  Musculoskeletal:        General: Normal range of motion.     Cervical back: Normal range of motion and neck supple.  Lymphadenopathy:     Cervical: Cervical adenopathy present.  Skin:    General: Skin is warm and dry.     Capillary Refill: Capillary refill takes less than 2 seconds.  Neurological:     General: No focal deficit present.     Mental Status: She is alert and oriented to person, place, and time.  Psychiatric:        Mood and Affect: Mood normal.        Behavior: Behavior normal.        Thought Content: Thought content normal.        Judgment: Judgment normal.    Today's Vitals   04/29/21 1513  BP: 125/82  Pulse: 88  Temp: 98.5 F (36.9 C)  SpO2: 99%  Weight: 188 lb 12.8 oz (85.6 kg)  Height: 5' 4"  (1.626 m)   Body mass index is 32.41 kg/m.   Wt Readings from Last 3 Encounters:  04/29/21 188 lb 12.8 oz (85.6 kg)  04/21/21 180 lb (81.6 kg)  03/30/21 191 lb (86.6 kg)    Health Maintenance Due  Topic Date Due   Hepatitis C Screening  Never done   Zoster Vaccines- Shingrix (1 of 2) Never done   COLONOSCOPY (Pts 45-57yr Insurance coverage will need to be confirmed)  Never done   COVID-19 Vaccine (4 - Booster for Moderna series) 11/17/2020   INFLUENZA VACCINE  03/01/2021   PAP SMEAR-Modifier  06/18/2021    There are no preventive care reminders to display for this patient.   Lab Results  Component Value Date   TSH 1.740 04/23/2021   Lab Results  Component Value Date   WBC 7.0 04/23/2021  HGB 14.3 04/23/2021   HCT 42.8 04/23/2021   MCV 93 04/23/2021   PLT 184 04/23/2021   Lab Results  Component Value Date   NA 142  04/23/2021   K 4.0 04/23/2021   CO2 24 04/23/2021   GLUCOSE 89 04/23/2021   BUN 17 04/23/2021   CREATININE 0.83 04/23/2021   BILITOT 1.2 04/23/2021   ALKPHOS 57 04/23/2021   AST 21 04/23/2021   ALT 27 04/23/2021   PROT 6.6 04/23/2021   ALBUMIN 4.3 04/23/2021   CALCIUM 9.4 04/23/2021   ANIONGAP 7 12/15/2020   EGFR 86 04/23/2021   GFR 72.11 08/13/2019   Lab Results  Component Value Date   CHOL 249 (H) 04/23/2021   Lab Results  Component Value Date   HDL 57 04/23/2021   Lab Results  Component Value Date   LDLCALC 174 (H) 04/23/2021   Lab Results  Component Value Date   TRIG 105 04/23/2021   Lab Results  Component Value Date   CHOLHDL 4.4 04/23/2021   Lab Results  Component Value Date   HGBA1C 6.3 (H) 04/23/2021       Assessment & Plan:  1. Acute recurrent pansinusitis Start Biaxin 500 mg tablets twice daily for next 10 days. Rest and increase fluids. Continue using OTC medication to control symptoms.   - clarithromycin (BIAXIN) 500 MG tablet; Take 1 tablet (500 mg total) by mouth 2 (two) times daily.  Dispense: 20 tablet; Refill: 0  2. Cough Prescribed tussionex cough suppressant to take twice daily as needed for cough. Recommended she use at night and when at home only, as this medication can cause significant drowsiness and dizziness.  She voiced understanding and agreement with instructions. - chlorpheniramine-HYDROcodone (TUSSIONEX PENNKINETIC ER) 10-8 MG/5ML SUER; Take 5 mLs by mouth every 12 (twelve) hours as needed for cough.  Dispense: 115 mL; Refill: 0   Problem List Items Addressed This Visit       Respiratory   Acute recurrent pansinusitis - Primary   Relevant Medications   clarithromycin (BIAXIN) 500 MG tablet   chlorpheniramine-HYDROcodone (TUSSIONEX PENNKINETIC ER) 10-8 MG/5ML SUER     Other   Cough   Relevant Medications   chlorpheniramine-HYDROcodone (TUSSIONEX PENNKINETIC ER) 10-8 MG/5ML SUER     Meds ordered this encounter   Medications   clarithromycin (BIAXIN) 500 MG tablet    Sig: Take 1 tablet (500 mg total) by mouth 2 (two) times daily.    Dispense:  20 tablet    Refill:  0    Order Specific Question:   Supervising Provider    Answer:   Beatrice Lecher D [[id number]]   chlorpheniramine-HYDROcodone (TUSSIONEX PENNKINETIC ER) 10-8 MG/5ML SUER    Sig: Take 5 mLs by mouth every 12 (twelve) hours as needed for cough.    Dispense:  115 mL    Refill:  0    Order Specific Question:   Supervising Provider    Answer:   Beatrice Lecher D [[id number]]   This note was dictated using Dragon Voice Recognition Software. Rapid proofreading was performed to expedite the delivery of the information. Despite proofreading, phonetic errors will occur which are common with this voice recognition software. Please take this into consideration. If there are any concerns, please contact our office.    [name], NP

## 2021-04-30 ENCOUNTER — Ambulatory Visit: Payer: BC Managed Care – PPO | Admitting: Nurse Practitioner

## 2021-05-03 ENCOUNTER — Telehealth: Payer: Self-pay | Admitting: Pain Medicine

## 2021-05-03 NOTE — Telephone Encounter (Signed)
Voicemail left with patient re; message from Friday.  Left message that if she is still taking antibiotics and has not finished the course that we will need to reschedule the procedure appt.  However, if she has finished the antibiotic and is getting better we could see her.  Asked her to please return the call to let us know.

## 2021-05-03 NOTE — Progress Notes (Deleted)
NO SHOW

## 2021-05-03 NOTE — Telephone Encounter (Signed)
Patient lvmail Friday at 11:50. States she is on anti-biotics and cough med w/ hydrocodone in it. Should she still come for appt tomorrow 05-04-21 procedure. Please advise patient. Thank you.

## 2021-05-04 ENCOUNTER — Encounter: Payer: Self-pay | Admitting: General Surgery

## 2021-05-04 ENCOUNTER — Ambulatory Visit: Payer: BC Managed Care – PPO | Admitting: Pain Medicine

## 2021-05-06 ENCOUNTER — Other Ambulatory Visit: Payer: Self-pay

## 2021-05-09 DIAGNOSIS — R059 Cough, unspecified: Secondary | ICD-10-CM | POA: Insufficient documentation

## 2021-05-09 DIAGNOSIS — J0141 Acute recurrent pansinusitis: Secondary | ICD-10-CM | POA: Insufficient documentation

## 2021-05-24 ENCOUNTER — Other Ambulatory Visit: Payer: Self-pay

## 2021-05-24 ENCOUNTER — Other Ambulatory Visit: Payer: Self-pay | Admitting: Nurse Practitioner

## 2021-05-24 DIAGNOSIS — M47816 Spondylosis without myelopathy or radiculopathy, lumbar region: Secondary | ICD-10-CM

## 2021-05-24 MED ORDER — MELOXICAM 15 MG PO TABS
15.0000 mg | ORAL_TABLET | Freq: Every day | ORAL | 0 refills | Status: DC
  Filled 2021-05-24: qty 90, 90d supply, fill #0

## 2021-05-25 ENCOUNTER — Ambulatory Visit: Payer: BC Managed Care – PPO | Admitting: Pain Medicine

## 2021-05-25 ENCOUNTER — Other Ambulatory Visit: Payer: Self-pay | Admitting: Nurse Practitioner

## 2021-05-26 ENCOUNTER — Other Ambulatory Visit: Payer: Self-pay

## 2021-05-26 MED ORDER — AZELASTINE HCL 0.1 % NA SOLN
1.0000 | Freq: Two times a day (BID) | NASAL | 1 refills | Status: DC
  Filled 2021-05-26: qty 30, 30d supply, fill #0
  Filled 2022-05-12: qty 30, 30d supply, fill #1

## 2021-06-01 ENCOUNTER — Other Ambulatory Visit
Admission: RE | Admit: 2021-06-01 | Discharge: 2021-06-01 | Disposition: A | Discharge status: Home / Self Care | Payer: BC Managed Care – PPO | Source: Ambulatory Visit | Attending: Urology | Admitting: Urology

## 2021-06-01 ENCOUNTER — Other Ambulatory Visit: Payer: Self-pay

## 2021-06-01 NOTE — Patient Instructions (Addendum)
Your procedure is scheduled on: 06/14/21- Monday Report to the Registration Desk on the 1st floor of the [facility]. To find out your arrival time, please call [phone] between 1PM - 3PM on: 06/11/21 - Friday  REMEMBER: Instructions that are not followed completely may result in serious medical risk, up to and including death; or upon the discretion of your surgeon and anesthesiologist your surgery may need to be rescheduled.  Do not eat food or drink any fluids after midnight the night before surgery.  No gum chewing, lozengers or hard candies.  TAKE THESE MEDICATIONS THE MORNING OF SURGERY WITH A SIP OF WATER:  - fludrocortisone (FLORINEF) 0.1 MG tablet, take 1/2 tablet as directed - fluticasone (FLONASE SENSIMIST) 27.5 MCG/SPRAY nasal spray - hydrocortisone (CORTEF) 10 MG tablet, take 1/2 take as directed  One week prior to surgery: Stop Anti-inflammatories (NSAIDS) such as Advil, Aleve, Ibuprofen, Motrin, Naproxen, Naprosyn and Aspirin based products such as Excedrin, Goodys Powder, BC Powder.  Stop ANY OVER THE COUNTER supplements until after surgery.  You may however, continue to take Tylenol if needed for pain up until the day of surgery.  No Alcohol for 24 hours before or after surgery.  No Smoking including e-cigarettes for 24 hours prior to surgery.  No chewable tobacco products for at least 6 hours prior to surgery.  No nicotine patches on the day of surgery.  Do not use any "recreational" drugs for at least a week prior to your surgery.  Please be advised that the combination of cocaine and anesthesia may have negative outcomes, up to and including death. If you test positive for cocaine, your surgery will be cancelled.  On the morning of surgery brush your teeth with toothpaste and water, you may rinse your mouth with mouthwash if you wish. Do not swallow any toothpaste or mouthwash.  Do not wear jewelry, make-up, hairpins, clips or nail polish.  Do not wear  lotions, powders, or perfumes.   Do not shave body from the neck down 48 hours prior to surgery just in case you cut yourself which could leave a site for infection.  Also, freshly shaved skin may become irritated if using the CHG soap.  Contact lenses, hearing aids and dentures may not be worn into surgery.  Do not bring valuables to the hospital. [facility] is not responsible for any missing/lost belongings or valuables.   Notify your doctor if there is any change in your medical condition (cold, fever, infection).  Wear comfortable clothing (specific to your surgery type) to the hospital.  After surgery, you can help prevent lung complications by doing breathing exercises.  Take deep breaths and cough every 1-2 hours. Your doctor may order a device called an Incentive Spirometer to help you take deep breaths. When coughing or sneezing, hold a pillow firmly against your incision with both hands. This is called "splinting." Doing this helps protect your incision. It also decreases belly discomfort.  If you are being admitted to the hospital overnight, leave your suitcase in the car. After surgery it may be brought to your room.  If you are being discharged the day of surgery, you will not be allowed to drive home. You will need a responsible adult (18 years or older) to drive you home and stay with you that night.   If you are taking public transportation, you will need to have a responsible adult (18 years or older) with you. Please confirm with your physician that it is acceptable to  use public transportation.   Please call the [facility] Dept. at [phone] if you have any questions about these instructions.  Surgery Visitation Policy:  Patients undergoing a surgery or procedure may have one family member or support person with them as long as that person is not COVID-19 positive or experiencing its symptoms.  That person may remain in the waiting area during the  procedure and may rotate out with other people.  Inpatient Visitation:    Visiting hours are 7 a.m. to 8 p.m. Up to two visitors ages 16+ are allowed at one time in a patient room. The visitors may rotate out with other people during the day. Visitors must check out when they leave, or other visitors will not be allowed. One designated support person may remain overnight. The visitor must pass COVID-19 screenings, use hand sanitizer when entering and exiting the patient's room and wear a mask at all times, including in the patient's room. Patients must also wear a mask when staff or their visitor are in the room. Masking is required regardless of vaccination status.

## 2021-06-03 ENCOUNTER — Ambulatory Visit
Admission: EM | Admit: 2021-06-03 | Discharge: 2021-06-03 | Disposition: A | Discharge status: Home / Self Care | Payer: BC Managed Care – PPO

## 2021-06-03 ENCOUNTER — Encounter: Payer: Self-pay | Admitting: Emergency Medicine

## 2021-06-03 ENCOUNTER — Other Ambulatory Visit: Payer: Self-pay | Admitting: *Deleted

## 2021-06-03 ENCOUNTER — Other Ambulatory Visit: Payer: Self-pay

## 2021-06-03 ENCOUNTER — Other Ambulatory Visit: Payer: BC Managed Care – PPO

## 2021-06-03 DIAGNOSIS — N200 Calculus of kidney: Secondary | ICD-10-CM

## 2021-06-03 DIAGNOSIS — M722 Plantar fascial fibromatosis: Secondary | ICD-10-CM

## 2021-06-03 NOTE — ED Triage Notes (Signed)
Pt c/o left ankle pain x 2 weeks. No known injury.

## 2021-06-03 NOTE — ED Provider Notes (Signed)
Chief Complaint   Chief Complaint  Patient presents with   Ankle Pain      Subjective, HPI  Almeter Westhoff is a 50 y.o. female who presents with left ankle pain and swelling onset two weeks ago. No known injury.  Patient reports discomfort to the plantar aspect of the left heel which seems to radiate to the medial aspect of her heel region.  History obtained from patient.  Patient's problem list, past medical and social history, medications, and allergies were reviewed by me and updated in Epic.    ROS  See HPI.  Objective   Vitals:   06/03/21 0949  BP: 137/84  Pulse: 95  Resp: 18  Temp: 98.9 F (37.2 C)  SpO2: 98%    Vital signs and nursing note reviewed.   General: Appears well-developed and well-nourished. No acute distress.  Head: Normocephalic and atraumatic.   Neck: Normal range of motion, neck is supple.  Cardiovascular: Normal rate. Pulm/Chest: No respiratory distress.  Musculoskeletal: Left foot: Mild TTP noted to plantar aspect of heel and medial aspect with mild swelling to medial aspect of heel without bruising.  No TTP to Achilles.  5/5 strength, full sensation, 2+  pulses, < 2 sec cap refill.  Neurological: Alert and oriented to person, place, and time.  Skin: Skin is warm and dry.   Psychiatric: Normal mood, affect, behavior, and thought content.    Data  No results found for any visits on 06/03/21.   Imaging None needed.  No trauma or falls precipitating current symptoms.  Assessment & Plan  1. Plantar fasciitis  50 y.o. female presents with left ankle pain and swelling onset two weeks ago. No known injury.  Patient reports discomfort to the plantar aspect of the left heel which seems to radiate to the medial aspect of her heel region.  Given symptoms along with assessment findings, likely plantar fasciitis.  Advised about home treatment and care to include Voltaren gel, elevation of the left foot, rolling the foot over a frozen water bottle  about 3-5 times daily, supportive shoes.  Advised that if she continues to have discomfort and/or swelling over the next 5 to 7 days that she will need to follow-up with orthopedics.  Patient verbalized understanding and agreed with plan.  Patient stable upon discharge.  Return as needed.  Plan:   Discharge Instructions      Use topical Voltaren gel 2-3 times daily to the area of concern.  Keep left foot elevated throughout the day to help with swelling.  Roll your foot over a frozen water bottle about 3-5 times daily to help with inflammation.  Wear supportive shoes.  If you continue to have discomfort and/or swelling over the next 5 to 7 days please follow-up with orthopedics as advised.         Serafina Royals, [place] 06/03/21 1008

## 2021-06-03 NOTE — Discharge Instructions (Addendum)
Use topical Voltaren gel 2-3 times daily to the area of concern.  Keep left foot elevated throughout the day to help with swelling.  Roll your foot over a frozen water bottle about 3-5 times daily to help with inflammation.  Wear supportive shoes.  If you continue to have discomfort and/or swelling over the next 5 to 7 days please follow-up with orthopedics as advised.

## 2021-06-04 ENCOUNTER — Other Ambulatory Visit: Payer: Self-pay

## 2021-06-06 LAB — CULTURE, URINE COMPREHENSIVE

## 2021-06-14 NOTE — Progress Notes (Signed)
PROVIDER NOTE: Information contained herein reflects review and annotations entered in association with encounter. Interpretation of such information and data should be left to medically-trained personnel. Information provided to patient can be located elsewhere in the medical record under "Patient Instructions". Document created using STT-dictation technology, any transcriptional errors that may result from process are unintentional.    Patient: Denise Burns  Service Category: Procedure Provider: [name], MD DOB: 1971-06-16 DOS: 06/15/2021 Location: [facility] MRN: [id number] Setting: Ambulatory - outpatient Referring Provider: [name], NP Type: Established Patient Specialty: Interventional Pain Management PCP: [name], NP  Primary Reason for Visit: Interventional Pain Management Treatment. CC: Hip Pain (bilateral)    Procedure #1:  Anesthesia, Analgesia, Anxiolysis:  Type: Intra-Articular Hip Injection          Primary Purpose: Diagnostic Region: Posterolateral hip joint area. Level: Lower pelvic and hip joint level. Target Area: Superior aspect of the hip joint cavity, going thru the superior portion of the capsular ligament. Approach: Posterolateral approach. Laterality: Bilateral  Anesthesia: Local (1-2% Lidocaine)  Anxiolysis: None  Sedation: None  Guidance: Fluoroscopy           Position: Prone Area Prepped: Entire Posterolateral hip area. DuraPrep (Iodine Povacrylex [0.7% available iodine] and Isopropyl Alcohol, 74% w/w)   Procedure #2:    Type: Iliopsoas Bursa Injection          Primary Purpose: Diagnostic Region: Upper (proximal) Femoral Region Level: Hip Joint Target Area: Superior aspect of the hip joint cavity, going thru the superior portion of the capsular ligament. Approach: Posterolateral approach Laterality: Right     Indications: 1. Chronic hip pain (Bilateral)   2. Chronic groin pain (Right)   3.  Osteoarthritis of hips (Bilateral)   4. Enthesopathy hip region (Left)   5. Greater trochanteric bursitis (Bilateral)    Pain Score: Pre-procedure: 6 /10 Post-procedure: 6 /10     Pre-op H&P Assessment:  Ms. Farace is a 50 y.o. (year old), female patient, seen today for interventional treatment. She  has a past surgical history that includes Cholecystectomy (2010); Polypectomy; dental work; Dilation and curettage of uterus (2010); IR NEPHROSTOMY PLACEMENT LEFT (12/14/2020); Nephrolithotomy (Left, 12/14/2020); Wisdom tooth extraction; and Diagnostic laparoscopy (2004). Ms. Gathright has a current medication list which includes the following prescription(s): azelastine, diazepam, fludrocortisone, flonase sensimist, hydrocodone-acetaminophen, [START ON 07/05/2021] hydrocodone-acetaminophen, hydrocortisone, meloxicam, plenvu, phenazopyridine, pseudoephedrine, tizanidine, chlorpheniramine-hydrocodone, clarithromycin, hydrocodone-acetaminophen, and hydrocodone-acetaminophen, and the following Facility-Administered Medications: iohexol, lidocaine, methylprednisolone acetate, pentafluoroprop-tetrafluoroeth, and ropivacaine (pf) 2 mg/ml (0.2%). Her primarily concern today is the Hip Pain (bilateral)  Initial Vital Signs:  Pulse/HCG Rate: 96  Temp: (!) 97.2 F (36.2 C) Resp: 16 BP: 122/83 SpO2: 100 %  BMI: Estimated body mass index is 30.9 kg/m as calculated from the following:   Height as of this encounter: 5\' 4"  (1.626 m).   Weight as of this encounter: 180 lb (81.6 kg).  Risk Assessment: Allergies: Reviewed. She is allergic to lisinopril, sulfacetamide sodium (acne), tramadol, and sulfa antibiotics.  Allergy Precautions: None required Coagulopathies: Reviewed. None identified.  Blood-thinner therapy: None at this time Active Infection(s): Reviewed. None identified. Ms. Shetterly is afebrile  Site Confirmation: Ms. Coye was asked to confirm the procedure and laterality before marking the  site Procedure checklist: Completed Consent: Before the procedure and under the influence of no sedative(s), amnesic(s), or anxiolytics, the patient was informed of the treatment options, risks and possible complications. To fulfill our ethical and legal obligations, as recommended by the  American Medical Association's Code of Ethics, I have informed the patient of my clinical impression; the nature and purpose of the treatment or procedure; the risks, benefits, and possible complications of the intervention; the alternatives, including doing nothing; the risk(s) and benefit(s) of the alternative treatment(s) or procedure(s); and the risk(s) and benefit(s) of doing nothing. The patient was provided information about the general risks and possible complications associated with the procedure. These may include, but are not limited to: failure to achieve desired goals, infection, bleeding, organ or nerve damage, allergic reactions, paralysis, and death. In addition, the patient was informed of those risks and complications associated to the procedure, such as failure to decrease pain; infection; bleeding; organ or nerve damage with subsequent damage to sensory, motor, and/or autonomic systems, resulting in permanent pain, numbness, and/or weakness of one or several areas of the body; allergic reactions; (i.e.: anaphylactic reaction); and/or death. Furthermore, the patient was informed of those risks and complications associated with the medications. These include, but are not limited to: allergic reactions (i.e.: anaphylactic or anaphylactoid reaction(s)); adrenal axis suppression; blood sugar elevation that in diabetics may result in ketoacidosis or comma; water retention that in patients with history of congestive heart failure may result in shortness of breath, pulmonary edema, and decompensation with resultant heart failure; weight gain; swelling or edema; medication-induced neural toxicity; particulate matter  embolism and blood vessel occlusion with resultant organ, and/or nervous system infarction; and/or aseptic necrosis of one or more joints. Finally, the patient was informed that Medicine is not an exact science; therefore, there is also the possibility of unforeseen or unpredictable risks and/or possible complications that may result in a catastrophic outcome. The patient indicated having understood very clearly. We have given the patient no guarantees and we have made no promises. Enough time was given to the patient to ask questions, all of which were answered to the patient's satisfaction. Ms. Scharfenberg has indicated that she wanted to continue with the procedure. Attestation: I, the ordering provider, attest that I have discussed with the patient the benefits, risks, side-effects, alternatives, likelihood of achieving goals, and potential problems during recovery for the procedure that I have provided informed consent. Date  Time: 06/15/2021 11:24 AM  Pre-Procedure Preparation:  Monitoring: As per clinic protocol. Respiration, ETCO2, SpO2, BP, heart rate and rhythm monitor placed and checked for adequate function Safety Precautions: Patient was assessed for positional comfort and pressure points before starting the procedure. Time-out: I initiated and conducted the "Time-out" before starting the procedure, as per protocol. The patient was asked to participate by confirming the accuracy of the "Time Out" information. Verification of the correct person, site, and procedure were performed and confirmed by me, the nursing staff, and the patient. "Time-out" conducted as per Joint Commission's Universal Protocol (UP.01.01.01). Time:    Description of Procedure #1:  Safety Precautions: Aspiration looking for blood return was conducted prior to all injections. At no point did we inject any substances, as a needle was being advanced. No attempts were made at seeking any paresthesias. Safe injection practices  and needle disposal techniques used. Medications properly checked for expiration dates. SDV (single dose vial) medications used. Description of the Procedure: Protocol guidelines were followed. The patient was placed in position over the fluoroscopy table. The target area was identified and the area prepped in the usual manner. Skin & deeper tissues infiltrated with local anesthetic. Appropriate amount of time allowed to pass for local anesthetics to take effect. The procedure needles were then advanced to  the target area. Proper needle placement secured. Negative aspiration confirmed. Solution injected in intermittent fashion, asking for systemic symptoms every 0.5cc of injectate. The needles were then removed and the area cleansed, making sure to leave some of the prepping solution back to take advantage of its long term bactericidal properties.  Start Time:   hrs. Materials:  Needle(s) Type: Spinal Needle Gauge: 22G Length: 5.0-in Medication(s): Please see orders for medications and dosing details.  Imaging Guidance (Non-Spinal):          Type of Imaging Technique: Fluoroscopy Guidance (Non-Spinal) Indication(s): Assistance in needle guidance and placement for procedures requiring needle placement in or near specific anatomical locations not easily accessible without such assistance. Exposure Time: Please see nurses notes. Contrast: Before injecting any contrast, we confirmed that the patient did not have an allergy to iodine, shellfish, or radiological contrast. Once satisfactory needle placement was completed at the desired level, radiological contrast was injected. Contrast injected under live fluoroscopy. No contrast complications. See chart for type and volume of contrast used. Fluoroscopic Guidance: I was personally present during the use of fluoroscopy. "Tunnel Vision Technique" used to obtain the best possible view of the target area. Parallax error corrected before commencing the  procedure. "Direction-depth-direction" technique used to introduce the needle under continuous pulsed fluoroscopy. Once target was reached, antero-posterior, oblique, and lateral fluoroscopic projection used confirm needle placement in all planes. Images permanently stored in EMR. Interpretation: I personally interpreted the imaging intraoperatively. Adequate needle placement confirmed in multiple planes. Appropriate spread of contrast into desired area was observed. No evidence of afferent or efferent intravascular uptake. Permanent images saved into the patient's record.   Description of Procedure #2:  Description of the Procedure: Skin & deeper tissues infiltrated with local anesthetic. Appropriate amount of time allowed to pass for local anesthetics to take effect. The procedure needles were then advanced to the target area. Proper needle placement secured. Negative aspiration confirmed. Solution injected in intermittent fashion, asking for systemic symptoms every 0.5cc of injectate. The needles were then removed and the area cleansed, making sure to leave some of the prepping solution back to take advantage of its long term bactericidal properties.  Vitals:   06/15/21 1124  BP: 122/83  Pulse: 96  Resp: 16  Temp: (!) 97.2 F (36.2 C)  TempSrc: Temporal  SpO2: 100%  Weight: 180 lb (81.6 kg)  Height: 5\' 4"  (1.626 m)     End Time:   hrs.         Materials:  Needle(s) Type: Spinal Needle Gauge: 22G Length: 5.0-in Medication(s): Please see orders for medications and dosing details.  Imaging Guidance (Non-Spinal):          Type of Imaging Technique: Fluoroscopy Guidance (Non-Spinal) Indication(s): Assistance in needle guidance and placement for procedures requiring needle placement in or near specific anatomical locations not easily accessible without such assistance. Exposure Time: Please see nurses notes. Contrast: Before injecting any contrast, we confirmed that the patient did  not have an allergy to iodine, shellfish, or radiological contrast. Once satisfactory needle placement was completed at the desired level, radiological contrast was injected. Contrast injected under live fluoroscopy. No contrast complications. See chart for type and volume of contrast used. Fluoroscopic Guidance: I was personally present during the use of fluoroscopy. "Tunnel Vision Technique" used to obtain the best possible view of the target area. Parallax error corrected before commencing the procedure. "Direction-depth-direction" technique used to introduce the needle under continuous pulsed fluoroscopy. Once target was reached, antero-posterior, oblique,  and lateral fluoroscopic projection used confirm needle placement in all planes. Images permanently stored in EMR. Interpretation: I personally interpreted the imaging intraoperatively. Adequate needle placement confirmed in multiple planes. Appropriate spread of contrast into desired area was observed. No evidence of afferent or efferent intravascular uptake. Permanent images saved into the patient's record.  Antibiotic Prophylaxis:   Anti-infectives (From admission, onward)    None      Indication(s): None identified  Post-operative Assessment:  Post-procedure Vital Signs:  Pulse/HCG Rate: 96  Temp:  (!) 97.2 F (36.2 C) Resp: 16 BP: 122/83 SpO2: 100 %  EBL: None  Complications: No immediate post-treatment complications observed by team, or reported by patient.  Note: The patient tolerated the entire procedure well. A repeat set of vitals were taken after the procedure and the patient was kept under observation following institutional policy, for this type of procedure. Post-procedural neurological assessment was performed, showing return to baseline, prior to discharge. The patient was provided with post-procedure discharge instructions, including a section on how to identify potential problems. Should any problems arise  concerning this procedure, the patient was given instructions to immediately contact us, at any time, without hesitation. In any case, we plan to contact the patient by telephone for a follow-up status report regarding this interventional procedure.  Comments:  No additional relevant information.  Plan of Care  Orders:  Orders Placed This Encounter  Procedures   HIP INJECTION    Scheduling Instructions:     Side: Bilateral     Sedation: Patient's choice.     Timeframe: Today   DG PA[facility] C-ARM 1-60 MIN NO REPORT    Intraoperative interpretation by procedural physician at [place].    Standing Status:   Standing    Number of Occurrences:   1    Order Specific Question:   Reason for exam:    Answer:   Assistance in needle guidance and placement for procedures requiring needle placement in or near specific anatomical locations not easily accessible without such assistance.   Informed Consent Details: Physician/Practitioner Attestation; Transcribe to consent form and obtain patient signature    Nursing Order: Transcribe to consent form and obtain patient signature. Note: Always confirm laterality of pain with [name], before procedure.    Order Specific Question:   Physician/Practitioner attestation of informed consent for procedure/surgical case    Answer:   I, the physician/practitioner, attest that I have discussed with the patient the benefits, risks, side effects, alternatives, likelihood of achieving goals and potential problems during recovery for the procedure that I have provided informed consent.    Order Specific Question:   Procedure    Answer:   Hip injection    Order Specific Question:   Physician/Practitioner performing the procedure    Answer:   Evonda Enge A. Dossie Arbour, MD    Order Specific Question:   Indication/Reason    Answer:   Hip Joint Pain (Arthralgia)   Provide equipment / supplies at bedside    "Block Tray" (Disposable  single use) Needle  type: SpinalSpinal Amount/quantity: 2 Size: Long (7-inch) Gauge: 22G    Standing Status:   Standing    Number of Occurrences:   1    Order Specific Question:   Specify    Answer:   Block Tray    Chronic Opioid Analgesic:  Hydrocodone/APAP 7.5/325 1 tablet p.o. 3 times daily (22.5 mg/day of hydrocodone)  MME/day: 22.5 mg/day.   Medications ordered for procedure: Meds ordered this encounter  Medications  iohexol (OMNIPAQUE) 180 MG/ML injection 10 mL    Must be Myelogram-compatible. If not available, you may substitute with a water-soluble, non-ionic, hypoallergenic, myelogram-compatible radiological contrast medium.   lidocaine (XYLOCAINE) 2 % (with pres) injection 400 mg   pentafluoroprop-tetrafluoroeth (GEBAUERS) aerosol   ropivacaine (PF) 2 mg/mL (0.2%) (NAROPIN) injection 18 mL   methylPREDNISolone acetate (DEPO-MEDROL) injection 160 mg    Medications administered: Vilinda Blanks. Ridolfi had no medications administered during this visit.  See the medical record for exact dosing, route, and time of administration.  Follow-up plan:   Return in about 2 weeks (around 06/29/2021) for Proc-day (T,Th), (F2F), (PPE).      Interventional Therapies  Risk  Complexity Considerations:   Estimated body mass index is 30.9 kg/m as calculated from the following:   Height as of this encounter: 5\' 4"  (1.626 m).   Weight as of this encounter: 180 lb (81.6 kg). WNL   Planned  Pending:   Diagnostic/therapeutic bilateral trochanteric bursa injection #1    Under consideration:   Diagnostic L2-3 L-FCT BLK #1  Diagnostic bilateral SI joint Blk  Possible bilateral SI joint RFA  Diagnostic bilateral L5 TFESI  Diagnostic left-sided L2 TFESI    Completed:   Therapeutic bilateral lumbar facet block x3 (09/08/2020) (4-0) (100/100/60/60)  Therapeutic right lumbar facet RFA x1 (01/07/2020) (4-0) (100/100/90/>75)  Therapeutic left lumbar facet RFA x1 (02/04/2020) (4-0) (100/100/90/>75)  Therapeutic right  IA hip inj. x2 (08/20/2020) (5-1) (100/100/80/80)  Therapeutic left IA hip inj. x1 (08/20/2020) (5-1) (100/100/80/80)    Therapeutic  Palliative (PRN) options:   Palliative bilateral lumbar facet MBB #4  Palliative right lumbar facet RFA #2 (last done 01/07/2020) Palliative left lumbar facet RFA #2 (last done 02/04/2020)     Recent Visits Date Type Provider Dept  04/21/21 Office Visit Milinda Pointer, MD [facility]  Showing recent visits within past 90 days and meeting all other requirements Today's Visits Date Type Provider Dept  06/15/21 Procedure visit Milinda Pointer, MD [facility]  Showing today's visits and meeting all other requirements Future Appointments Date Type Provider Dept  07/28/21 Appointment Milinda Pointer, MD [facility]  Showing future appointments within next 90 days and meeting all other requirements Disposition: Discharge home  Discharge (Date  Time): 06/15/2021;   hrs.   Primary Care Physician: [name], NP Location: [facility] Note by: [name], MD Date: 06/15/2021; Time: 11:40 AM  Disclaimer:  Medicine is not an Chief Strategy Officer. The only guarantee in medicine is that nothing is guaranteed. It is important to note that the decision to proceed with this intervention was based on the information collected from the patient. The Data and conclusions were drawn from the patient's questionnaire, the interview, and the physical examination. Because the information was provided in large part by the patient, it cannot be guaranteed that it has not been purposely or unconsciously manipulated. Every effort has been made to obtain as much relevant data as possible for this evaluation. It is important to note that the conclusions that lead to this procedure are derived in large part from the available data. Always take into account that the treatment will also be dependent on availability of  resources and existing treatment guidelines, considered by other Pain Management Practitioners as being common knowledge and practice, at the time of the intervention. For Medico-Legal purposes, it is also important to point out that variation in procedural techniques and pharmacological choices are the acceptable norm. The indications,  contraindications, technique, and results of the above procedure should only be interpreted and judged by a Board-Certified Interventional Pain Specialist with extensive familiarity and expertise in the same exact procedure and technique.

## 2021-06-15 ENCOUNTER — Other Ambulatory Visit: Payer: Self-pay

## 2021-06-15 ENCOUNTER — Ambulatory Visit (HOSPITAL_BASED_OUTPATIENT_CLINIC_OR_DEPARTMENT_OTHER): Payer: BC Managed Care – PPO | Admitting: Pain Medicine

## 2021-06-15 ENCOUNTER — Ambulatory Visit
Admission: RE | Admit: 2021-06-15 | Discharge: 2021-06-15 | Disposition: A | Discharge status: Home / Self Care | Payer: BC Managed Care – PPO | Source: Ambulatory Visit | Attending: Pain Medicine | Admitting: Pain Medicine

## 2021-06-15 ENCOUNTER — Encounter: Payer: Self-pay | Admitting: Pain Medicine

## 2021-06-15 VITALS — BP 130/85 | HR 96 | Temp 97.2°F | Resp 16 | Ht 64.0 in | Wt 180.0 lb

## 2021-06-15 DIAGNOSIS — G8929 Other chronic pain: Secondary | ICD-10-CM

## 2021-06-15 DIAGNOSIS — M160 Bilateral primary osteoarthritis of hip: Secondary | ICD-10-CM

## 2021-06-15 DIAGNOSIS — M7062 Trochanteric bursitis, left hip: Secondary | ICD-10-CM

## 2021-06-15 DIAGNOSIS — M549 Dorsalgia, unspecified: Secondary | ICD-10-CM

## 2021-06-15 DIAGNOSIS — R1031 Right lower quadrant pain: Secondary | ICD-10-CM | POA: Diagnosis not present

## 2021-06-15 DIAGNOSIS — M25552 Pain in left hip: Secondary | ICD-10-CM | POA: Insufficient documentation

## 2021-06-15 DIAGNOSIS — M25551 Pain in right hip: Secondary | ICD-10-CM

## 2021-06-15 DIAGNOSIS — M76892 Other specified enthesopathies of left lower limb, excluding foot: Secondary | ICD-10-CM | POA: Diagnosis not present

## 2021-06-15 DIAGNOSIS — M7061 Trochanteric bursitis, right hip: Secondary | ICD-10-CM | POA: Diagnosis not present

## 2021-06-15 MED ORDER — LIDOCAINE HCL 2 % IJ SOLN
20.0000 mL | Freq: Once | INTRAMUSCULAR | Status: AC
  Administered 2021-06-15: 200 mg

## 2021-06-15 MED ORDER — ROPIVACAINE HCL 2 MG/ML IJ SOLN
INTRAMUSCULAR | Status: AC
  Filled 2021-06-15: qty 20

## 2021-06-15 MED ORDER — LIDOCAINE HCL 2 % IJ SOLN
INTRAMUSCULAR | Status: AC
  Filled 2021-06-15: qty 40

## 2021-06-15 MED ORDER — PENTAFLUOROPROP-TETRAFLUOROETH EX AERO
INHALATION_SPRAY | Freq: Once | CUTANEOUS | Status: DC
  Filled 2021-06-15: qty 116

## 2021-06-15 MED ORDER — ROPIVACAINE HCL 2 MG/ML IJ SOLN
18.0000 mL | Freq: Once | INTRAMUSCULAR | Status: AC
  Administered 2021-06-15: 18 mL via INTRA_ARTICULAR

## 2021-06-15 MED ORDER — METHYLPREDNISOLONE ACETATE 80 MG/ML IJ SUSP
INTRAMUSCULAR | Status: AC
  Filled 2021-06-15: qty 2

## 2021-06-15 MED ORDER — IOHEXOL 180 MG/ML  SOLN
10.0000 mL | Freq: Once | INTRAMUSCULAR | Status: AC
  Administered 2021-06-15: 5 mL via INTRA_ARTICULAR

## 2021-06-15 MED ORDER — METHYLPREDNISOLONE ACETATE 80 MG/ML IJ SUSP
160.0000 mg | Freq: Once | INTRAMUSCULAR | Status: AC
  Administered 2021-06-15: 160 mg via INTRA_ARTICULAR

## 2021-06-15 NOTE — Patient Instructions (Addendum)
____________________________________________________________________________________________  Virtual Visits   What is a "Virtual Visit"? It is a healthcare communication encounter (medical visit) that takes place on real time (NOT TEXT or E-MAIL) over the telephone or computer device (desktop, laptop, tablet, smart phone, etc.). It allows for more location flexibility between the patient and the healthcare provider.  Who decides when these types of visits will be used? The physician.  Who is eligible for these types of visits? Only those patients that can be reliably reached over the telephone.  What do you mean by reliably? We do not have time to call everyone multiple times, therefore those that tend to screen calls and then call back later are not suitable candidates for this system. We understand how people are reluctant to pickup on "unknown" calls, therefore, we suggest adding our telephone numbers to your list of "CONTACT(s)". This way, you should be able to readily identify our calls when you receive one. All of our numbers are available below.   Who is not eligible? This option is not available for medication management encounters, specially for controlled substances. Patients on pain medications that fall under the category of controlled substances have to come in for "Face-to-Face" encounters. This is required for mandatory monitoring of these substances. You may be asked to provide a sample for an unannounced urine drug screening test (UDS), and we will need to count your pain pills. Not bringing your pills to be counted may result in no refill. Obviously, neither one of these can be done over the phone.  When will this type of visits be used? You can request a virtual visit whenever you are physically unable to attend a regular appointment. The decision will be made by the physician (or healthcare provider) on a case by case basis.   At what time will I be called? This is an  excellent question. The providers will try to call you whenever they have time available. Do not expect to be called at any specific time. The secretaries will assign you a time for your virtual visit appointment, but this is done simply to keep a list of those patients that need to be called, but not for the purpose of keeping a time schedule. Be advised that the call may come in anytime during the day, between the hours of 8:00 AM and 8::00 PM, depending on provider availability. We do understand that the system is not perfect. If you are unable to be available that day on a moments notice, then request an "in-person" appointment rather than a "virtual visit".  Can I request my medication visits to be "Virtual"? Yes you may request it, but the decision is entirely up to the healthcare provider. Control substances require specific monitoring that requires Face-to-Face encounters. The number of encounters  and the extent of the monitoring is determined on a case by case basis.  Add a new contact to your smart phone and label it "PAIN CLINIC" Under this contact add the following numbers: Main: [phone] (Official Contact Number) Nurses: [phone] (These are outgoing only calling systems. Do not call this number.) Dr. Devansh Riese: [phone] or [phone] (Outgoing calls only. Do not call this number.)  ____________________________________________________________________________________________   ____________________________________________________________________________________________  Post-Procedure Discharge Instructions  Instructions: Apply ice:  Purpose: This will minimize any swelling and discomfort after procedure.  When: Day of procedure, as soon as you get home. How: Fill a plastic sandwich bag with crushed ice. Cover it with a small towel and apply to   injection site. How long: (15 min on, 15 min off) Apply for 15 minutes then remove x 15 minutes.  Repeat sequence on day  of procedure, until you go to bed. Apply heat:  Purpose: To treat any soreness and discomfort from the procedure. When: Starting the next day after the procedure. How: Apply heat to procedure site starting the day following the procedure. How long: May continue to repeat daily, until discomfort goes away. Food intake: Start with clear liquids (like water) and advance to regular food, as tolerated.  Physical activities: Keep activities to a minimum for the first 8 hours after the procedure. After that, then as tolerated. Driving: If you have received any sedation, be responsible and do not drive. You are not allowed to drive for 24 hours after having sedation. Blood thinner: (Applies only to those taking blood thinners) You may restart your blood thinner 6 hours after your procedure. Insulin: (Applies only to Diabetic patients taking insulin) As soon as you can eat, you may resume your normal dosing schedule. Infection prevention: Keep procedure site clean and dry. Shower daily and clean area with soap and water. Post-procedure Pain Diary: Extremely important that this be done correctly and accurately. Recorded information will be used to determine the next step in treatment. For the purpose of accuracy, follow these rules: Evaluate only the area treated. Do not report or include pain from an untreated area. For the purpose of this evaluation, ignore all other areas of pain, except for the treated area. After your procedure, avoid taking a long nap and attempting to complete the pain diary after you wake up. Instead, set your alarm clock to go off every hour, on the hour, for the initial 8 hours after the procedure. Document the duration of the numbing medicine, and the relief you are getting from it. Do not go to sleep and attempt to complete it later. It will not be accurate. If you received sedation, it is likely that you were given a medication that may cause amnesia. Because of this, completing  the diary at a later time may cause the information to be inaccurate. This information is needed to plan your care. Follow-up appointment: Keep your post-procedure follow-up evaluation appointment after the procedure (usually 2 weeks for most procedures, 6 weeks for radiofrequencies). DO NOT FORGET to bring you pain diary with you.   Expect: (What should I expect to see with my procedure?) From numbing medicine (AKA: Local Anesthetics): Numbness or decrease in pain. You may also experience some weakness, which if present, could last for the duration of the local anesthetic. Onset: Full effect within 15 minutes of injected. Duration: It will depend on the type of local anesthetic used. On the average, 1 to 8 hours.  From steroids (Applies only if steroids were used): Decrease in swelling or inflammation. Once inflammation is improved, relief of the pain will follow. Onset of benefits: Depends on the amount of swelling present. The more swelling, the longer it will take for the benefits to be seen. In some cases, up to 10 days. Duration: Steroids will stay in the system x 2 weeks. Duration of benefits will depend on multiple posibilities including persistent irritating factors. Side-effects: If present, they may typically last 2 weeks (the duration of the steroids). Frequent: Cramps (if they occur, drink Gatorade and take over-the-counter Magnesium 450-500 mg once to twice a day); water retention with temporary weight gain; increases in blood sugar; decreased immune system response; increased appetite. Occasional: Facial flushing (red,   warm cheeks); mood swings; menstrual changes. Uncommon: Long-term decrease or suppression of natural hormones; bone thinning. (These are more common with higher doses or more frequent use. This is why we prefer that our patients avoid having any injection therapies in other practices.)  Very Rare: Severe mood changes; psychosis; aseptic necrosis. From procedure: Some  discomfort is to be expected once the numbing medicine wears off. This should be minimal if ice and heat are applied as instructed.  Call if: (When should I call?) You experience numbness and weakness that gets worse with time, as opposed to wearing off. New onset bowel or bladder incontinence. (Applies only to procedures done in the spine)  Emergency Numbers: Durning business hours (Monday - Thursday, 8:00 AM - 4:00 PM) (Friday, 9:00 AM - 12:00 Noon): [phone] After hours: [phone] NOTE: If you are having a problem and are unable connect with, or to talk to a provider, then go to your nearest urgent care or emergency department. If the problem is serious and urgent, please call 911. ____________________________________________________________________________________________   

## 2021-06-16 ENCOUNTER — Telehealth: Payer: Self-pay | Admitting: *Deleted

## 2021-06-16 NOTE — Telephone Encounter (Signed)
Post procedure call;  voicemail left asking patient to please return the call if there are questions or concerns.

## 2021-06-28 ENCOUNTER — Ambulatory Visit
Admission: RE | Admit: 2021-06-28 | Discharge: 2021-06-28 | Disposition: A | Discharge status: Home / Self Care | Payer: BC Managed Care – PPO | Attending: Urology | Admitting: Urology

## 2021-06-28 ENCOUNTER — Encounter
Admission: RE | Disposition: A | Discharge status: Home / Self Care | Payer: Self-pay | Source: Home / Self Care | Attending: Urology

## 2021-06-28 ENCOUNTER — Other Ambulatory Visit: Payer: Self-pay

## 2021-06-28 ENCOUNTER — Ambulatory Visit: Payer: BC Managed Care – PPO | Admitting: Urgent Care

## 2021-06-28 ENCOUNTER — Ambulatory Visit: Payer: BC Managed Care – PPO

## 2021-06-28 ENCOUNTER — Encounter: Payer: Self-pay | Admitting: Urology

## 2021-06-28 DIAGNOSIS — Z87442 Personal history of urinary calculi: Secondary | ICD-10-CM | POA: Diagnosis not present

## 2021-06-28 DIAGNOSIS — N200 Calculus of kidney: Secondary | ICD-10-CM | POA: Insufficient documentation

## 2021-06-28 DIAGNOSIS — Y658 Other specified misadventures during surgical and medical care: Secondary | ICD-10-CM | POA: Diagnosis not present

## 2021-06-28 DIAGNOSIS — N9971 Accidental puncture and laceration of a genitourinary system organ or structure during a genitourinary system procedure: Secondary | ICD-10-CM | POA: Diagnosis not present

## 2021-06-28 DIAGNOSIS — Z87891 Personal history of nicotine dependence: Secondary | ICD-10-CM | POA: Diagnosis not present

## 2021-06-28 DIAGNOSIS — Z9889 Other specified postprocedural states: Secondary | ICD-10-CM | POA: Diagnosis not present

## 2021-06-28 DIAGNOSIS — Z87440 Personal history of urinary (tract) infections: Secondary | ICD-10-CM | POA: Diagnosis not present

## 2021-06-28 DIAGNOSIS — K219 Gastro-esophageal reflux disease without esophagitis: Secondary | ICD-10-CM | POA: Insufficient documentation

## 2021-06-28 HISTORY — PX: CYSTOSCOPY/URETEROSCOPY/HOLMIUM LASER/STENT PLACEMENT: SHX6546

## 2021-06-28 SURGERY — CYSTOSCOPY/URETEROSCOPY/HOLMIUM LASER/STENT PLACEMENT
Anesthesia: General | Site: Ureter | Laterality: Right

## 2021-06-28 MED ORDER — LIDOCAINE HCL (CARDIAC) PF 100 MG/5ML IV SOSY
PREFILLED_SYRINGE | INTRAVENOUS | Status: DC | PRN
  Administered 2021-06-28: 80 mg via INTRAVENOUS

## 2021-06-28 MED ORDER — OXYCODONE HCL 5 MG PO TABS: 5.0000 mg | ORAL_TABLET | Freq: Once | ORAL | Status: AC

## 2021-06-28 MED ORDER — TAMSULOSIN HCL 0.4 MG PO CAPS
0.4000 mg | ORAL_CAPSULE | Freq: Every day | ORAL | 0 refills | Status: DC
  Filled 2021-06-28: qty 30, 30d supply, fill #0

## 2021-06-28 MED ORDER — ACETAMINOPHEN 10 MG/ML IV SOLN
INTRAVENOUS | Status: AC
  Filled 2021-06-28: qty 100

## 2021-06-28 MED ORDER — CEFAZOLIN SODIUM-DEXTROSE 2-4 GM/100ML-% IV SOLN
2.0000 g | INTRAVENOUS | Status: AC
  Administered 2021-06-28: 10:00:00 2 g via INTRAVENOUS

## 2021-06-28 MED ORDER — DEXAMETHASONE SODIUM PHOSPHATE 10 MG/ML IJ SOLN
INTRAMUSCULAR | Status: DC | PRN
  Administered 2021-06-28: 10 mg via INTRAVENOUS

## 2021-06-28 MED ORDER — OXYBUTYNIN CHLORIDE 5 MG PO TABS
5.0000 mg | ORAL_TABLET | Freq: Three times a day (TID) | ORAL | 1 refills | Status: DC | PRN
  Filled 2021-06-28: qty 30, 10d supply, fill #0
  Filled 2021-07-15: qty 30, 10d supply, fill #1

## 2021-06-28 MED ORDER — CHLORHEXIDINE GLUCONATE 0.12 % MT SOLN: 15.0000 mL | Freq: Once | OROMUCOSAL | Status: AC

## 2021-06-28 MED ORDER — MIDAZOLAM HCL 2 MG/2ML IJ SOLN
INTRAMUSCULAR | Status: AC
  Filled 2021-06-28: qty 2

## 2021-06-28 MED ORDER — APREPITANT 40 MG PO CAPS
ORAL_CAPSULE | ORAL | Status: AC
  Administered 2021-06-28: 09:00:00 40 mg via ORAL
  Filled 2021-06-28: qty 1

## 2021-06-28 MED ORDER — ACETAMINOPHEN 10 MG/ML IV SOLN
INTRAVENOUS | Status: DC | PRN
  Administered 2021-06-28: 1000 mg via INTRAVENOUS

## 2021-06-28 MED ORDER — DEXAMETHASONE SODIUM PHOSPHATE 10 MG/ML IJ SOLN
INTRAMUSCULAR | Status: AC
  Filled 2021-06-28: qty 1

## 2021-06-28 MED ORDER — ROCURONIUM BROMIDE 10 MG/ML (PF) SYRINGE
PREFILLED_SYRINGE | INTRAVENOUS | Status: AC
  Filled 2021-06-28: qty 10

## 2021-06-28 MED ORDER — MIDAZOLAM HCL 2 MG/2ML IJ SOLN
INTRAMUSCULAR | Status: DC | PRN
  Administered 2021-06-28: 2 mg via INTRAVENOUS

## 2021-06-28 MED ORDER — ONDANSETRON HCL 4 MG/2ML IJ SOLN: 4.0000 mg | Freq: Once | INTRAMUSCULAR | Status: DC | PRN

## 2021-06-28 MED ORDER — SUGAMMADEX SODIUM 200 MG/2ML IV SOLN
INTRAVENOUS | Status: DC | PRN
  Administered 2021-06-28: 500 mg via INTRAVENOUS

## 2021-06-28 MED ORDER — ONDANSETRON HCL 4 MG/2ML IJ SOLN
INTRAMUSCULAR | Status: DC | PRN
  Administered 2021-06-28: 4 mg via INTRAVENOUS

## 2021-06-28 MED ORDER — FAMOTIDINE 20 MG PO TABS
ORAL_TABLET | ORAL | Status: AC
  Administered 2021-06-28: 09:00:00 20 mg via ORAL
  Filled 2021-06-28: qty 1

## 2021-06-28 MED ORDER — ONDANSETRON HCL 4 MG/2ML IJ SOLN
INTRAMUSCULAR | Status: AC
  Filled 2021-06-28: qty 2

## 2021-06-28 MED ORDER — CHLORHEXIDINE GLUCONATE 0.12 % MT SOLN
OROMUCOSAL | Status: AC
  Administered 2021-06-28: 09:00:00 15 mL via OROMUCOSAL
  Filled 2021-06-28: qty 15

## 2021-06-28 MED ORDER — LACTATED RINGERS IV SOLN: INTRAVENOUS | Status: DC

## 2021-06-28 MED ORDER — PROPOFOL 10 MG/ML IV BOLUS
INTRAVENOUS | Status: DC | PRN
  Administered 2021-06-28: 150 mg via INTRAVENOUS

## 2021-06-28 MED ORDER — FENTANYL CITRATE (PF) 100 MCG/2ML IJ SOLN
INTRAMUSCULAR | Status: DC | PRN
  Administered 2021-06-28: 100 ug via INTRAVENOUS

## 2021-06-28 MED ORDER — SUGAMMADEX SODIUM 500 MG/5ML IV SOLN
INTRAVENOUS | Status: AC
  Filled 2021-06-28: qty 5

## 2021-06-28 MED ORDER — IOPAMIDOL (ISOVUE-200) INJECTION 41%
INTRAVENOUS | Status: DC | PRN
  Administered 2021-06-28: 10:00:00 10 mL via INTRAVENOUS

## 2021-06-28 MED ORDER — PROPOFOL 10 MG/ML IV BOLUS
INTRAVENOUS | Status: AC
  Filled 2021-06-28: qty 20

## 2021-06-28 MED ORDER — CEFAZOLIN SODIUM-DEXTROSE 2-4 GM/100ML-% IV SOLN
INTRAVENOUS | Status: AC
  Filled 2021-06-28: qty 100

## 2021-06-28 MED ORDER — FAMOTIDINE 20 MG PO TABS: 20.0000 mg | ORAL_TABLET | Freq: Once | ORAL | Status: AC

## 2021-06-28 MED ORDER — OXYCODONE HCL 5 MG PO TABS
5.0000 mg | ORAL_TABLET | ORAL | 0 refills | Status: DC | PRN
  Filled 2021-06-28: qty 30, 5d supply, fill #0

## 2021-06-28 MED ORDER — APREPITANT 40 MG PO CAPS: 40.0000 mg | ORAL_CAPSULE | Freq: Once | ORAL | Status: AC

## 2021-06-28 MED ORDER — OXYCODONE HCL 5 MG PO TABS
ORAL_TABLET | ORAL | Status: AC
  Administered 2021-06-28: 11:00:00 5 mg via ORAL
  Filled 2021-06-28: qty 1

## 2021-06-28 MED ORDER — FENTANYL CITRATE (PF) 100 MCG/2ML IJ SOLN
INTRAMUSCULAR | Status: AC
  Filled 2021-06-28: qty 2

## 2021-06-28 MED ORDER — ORAL CARE MOUTH RINSE: 15.0000 mL | Freq: Once | OROMUCOSAL | Status: AC

## 2021-06-28 MED ORDER — SODIUM CHLORIDE 0.9 % IR SOLN
Status: DC | PRN
  Administered 2021-06-28: 1500 mL

## 2021-06-28 MED ORDER — FENTANYL CITRATE (PF) 100 MCG/2ML IJ SOLN: 25.0000 ug | INTRAMUSCULAR | Status: DC | PRN

## 2021-06-28 MED ORDER — ROCURONIUM BROMIDE 100 MG/10ML IV SOLN
INTRAVENOUS | Status: DC | PRN
  Administered 2021-06-28: 50 mg via INTRAVENOUS

## 2021-06-28 SURGICAL SUPPLY — 30 items
BAG DRAIN CYSTO-URO LG1000N (MISCELLANEOUS) ×2 IMPLANT
BASKET ZERO TIP 1.9FR (BASKET) IMPLANT
BRUSH SCRUB EZ 1% IODOPHOR (MISCELLANEOUS) ×2 IMPLANT
BSKT STON RTRVL ZERO TP 1.9FR (BASKET)
CATH URET FLEX-TIP 2 LUMEN 10F (CATHETERS) ×2 IMPLANT
CATH URETL OPEN 5X70 (CATHETERS) ×2 IMPLANT
CNTNR SPEC 2.5X3XGRAD LEK (MISCELLANEOUS)
CONT SPEC 4OZ STER OR WHT (MISCELLANEOUS)
CONT SPEC 4OZ STRL OR WHT (MISCELLANEOUS)
CONTAINER SPEC 2.5X3XGRAD LEK (MISCELLANEOUS) IMPLANT
DRAPE UTILITY 15X26 TOWEL STRL (DRAPES) ×2 IMPLANT
GAUZE 4X4 16PLY ~~LOC~~+RFID DBL (SPONGE) ×4 IMPLANT
GLOVE SURG ENC MOIS LTX SZ6.5 (GLOVE) ×2 IMPLANT
GOWN STRL REUS W/ TWL LRG LVL3 (GOWN DISPOSABLE) ×2 IMPLANT
GOWN STRL REUS W/TWL LRG LVL3 (GOWN DISPOSABLE) ×4
GUIDEWIRE GREEN .038 145CM (MISCELLANEOUS) ×2 IMPLANT
GUIDEWIRE STR DUAL SENSOR (WIRE) ×2 IMPLANT
INFUSOR MANOMETER BAG 3000ML (MISCELLANEOUS) ×2 IMPLANT
IV NS IRRIG 3000ML ARTHROMATIC (IV SOLUTION) ×2 IMPLANT
KIT TURNOVER CYSTO (KITS) ×2 IMPLANT
MANIFOLD NEPTUNE II (INSTRUMENTS) IMPLANT
PACK CYSTO AR (MISCELLANEOUS) ×2 IMPLANT
SET CYSTO W/LG BORE CLAMP LF (SET/KITS/TRAYS/PACK) ×2 IMPLANT
SHEATH URETERAL 12FRX35CM (MISCELLANEOUS) ×2 IMPLANT
STENT URET 6FRX24 CONTOUR (STENTS) ×2 IMPLANT
STENT URET 6FRX26 CONTOUR (STENTS) IMPLANT
SURGILUBE 2OZ TUBE FLIPTOP (MISCELLANEOUS) ×2 IMPLANT
TRACTIP FLEXIVA PULSE ID 200 (Laser) IMPLANT
WATER STERILE IRR 1000ML POUR (IV SOLUTION) ×2 IMPLANT
WATER STERILE IRR 500ML POUR (IV SOLUTION) ×2 IMPLANT

## 2021-06-28 NOTE — Anesthesia Preprocedure Evaluation (Signed)
Anesthesia Evaluation  Patient identified by MRN, date of birth, ID band Patient awake    Reviewed: Allergy & Precautions, H&P , NPO status , Patient's Chart, lab work & pertinent test results, reviewed documented beta blocker date and time   History of Anesthesia Complications (+) history of anesthetic complications  Airway Mallampati: II  TM Distance: >3 FB Neck ROM: full    Dental  (+) Teeth Intact   Pulmonary neg pulmonary ROS, shortness of breath and with exertion, former smoker,    Pulmonary exam normal        Cardiovascular Exercise Tolerance: Poor negative cardio ROS Normal cardiovascular exam Rhythm:regular Rate:Normal     Neuro/Psych  Neuromuscular disease negative psych ROS   GI/Hepatic Neg liver ROS, GERD  Medicated,  Endo/Other  negative endocrine ROS  Renal/GU Renal disease  negative genitourinary   Musculoskeletal   Abdominal   Peds  Hematology  (+) Blood dyscrasia, anemia ,   Anesthesia Other Findings Past Medical History: No date: Addison disease ([place]) No date: Arthritis No date: Back pain No date: Complication of anesthesia     Comment:  nausea No date: GERD (gastroesophageal reflux disease) Past Surgical History: 2010: CHOLECYSTECTOMY No date: dental work 2004: DIAGNOSTIC LAPAROSCOPY     Comment:  endometriosis dx 2010: [place] OF UTERUS 12/14/2020: IR NEPHROSTOMY PLACEMENT LEFT 12/14/2020: NEPHROLITHOTOMY; Left     Comment:  Procedure: NEPHROLITHOTOMY PERCUTANEOUS;  Surgeon:               Hollice Espy, MD;  Location: [facility] ORS;  Service:               Urology;  Laterality: Left; No date: POLYPECTOMY No date: WISDOM TOOTH EXTRACTION   Reproductive/Obstetrics negative OB ROS                             Anesthesia Physical Anesthesia Plan  ASA: 3  Anesthesia Plan: General ETT   Post-op Pain Management:    Induction:   PONV Risk  Score and Plan:   Airway Management Planned:   Additional Equipment:   Intra-op Plan:   Post-operative Plan:   Informed Consent: I have reviewed the patients History and Physical, chart, labs and discussed the procedure including the risks, benefits and alternatives for the proposed anesthesia with the patient or authorized representative who has indicated his/her understanding and acceptance.     Dental Advisory Given  Plan Discussed with: CRNA  Anesthesia Plan Comments:         Anesthesia Quick Evaluation

## 2021-06-28 NOTE — Op Note (Signed)
Date of procedure: 06/28/21  Preoperative diagnosis:  Right renal stone  Postoperative diagnosis:  Right renal stone  Procedure: Right ureteroscopy Right retrograde pyelogram Right ureteral stent placement  Surgeon: Hollice Espy, MD  Anesthesia: General  Complications: Right proximal ureteral perforation  Intraoperative findings: Right proximal ureteral perforation, lateral with extravasation this procedure was aborted and stent was placed over safety wire.  EBL: Minimal  Specimens: None  Drains: 6 x 24 French double-J ureteral stent on right  Indication: Denise Burns is a 50 y.o. patient with right kidney stone.  After reviewing the management options for treatment, she elected to proceed with the above surgical procedure(s). We have discussed the potential benefits and risks of the procedure, side effects of the proposed treatment, the likelihood of the patient achieving the goals of the procedure, and any potential problems that might occur during the procedure or recuperation. Informed consent has been obtained.  Description of procedure:  The patient was taken to the operating room and general anesthesia was induced.  The patient was placed in the dorsal lithotomy position, prepped and draped in the usual sterile fashion, and preoperative antibiotics were administered. A preoperative time-out was performed.   A 21 French the scope was advanced per urethra to the bladder.  On x-ray, the stone could be seen in the lower pole.  I intubated the right UO with a 5 [place] open-ended ureteral catheter injected contrast.  This showed no hydronephrosis and a delicate appearing ureter.  A sensor wire was then placed up to the level of the kidney which went easily.  A dual-lumen access sheath was used within the distal ureter to introduce a second Super Stiff wire.  Next, the safety wire was snapped in place and I used the Super Stiff wire to advance a Lacinda Axon 12/14 French sheath  under fluoroscopic guidance.  I met some resistance at the proximal ureter and was unable to get the access sheath as far as I had hoped.  I then advanced a digital flexible ureteroscope through this sheath and at the proximal ureter where the tip of the access sheath had been prior to the inner lumen being removed, there was a obvious ureteral perforation appreciated which was lateral posterior.  The wire was seen traversing through the true orifice.  I injected contrast and this did confirm extravasation consistent with ureteral perforation.  I elected to abort the procedure and place a stent to allow for ureteral healing prior to future endoscopic procedures to treat her stone.  I backloaded the safety wire over rigid cystoscope.  I then advanced a 6 x 24 French double-J ureteral stent which went up to the kidney without difficulty or retained contrast was seen confirming adequate position.  Upon removing the wire, there is a partial coil in the renal pelvis as well as a full coil within the bladder.  Try to manipulate the stent a little to try to create a full coil proximally however it appeared to be looped over a calyx and seemed satisfactory.  I was concerned that manipulating the stent further could potentially lead to loss of access and thus I left it in place as the stent adequately traversed the perforation and appeared to be in good position within the kidney.  She was then reversed from anesthesia after being cleaned and dried.  She was taken the PACU in stable condition.  Plan: I discussed the intraoperative complication with her husband.  He understands the need for indwelling ureteral stent to allow  for healing.  We will leave this for 4 to 6 weeks and then return to the operating room for stent removal and a retrograde pyelogram potentially to treat her stone at this time if her ureter has adequately healed.  All questions were answered.  She does have a personal history of chronic pain and thus  will prescribe her additional breakthrough narcotics as needed for stent pain as well as oxybutynin and Flomax help her tolerate the stent over the next coming weeks.  Hollice Espy, M.D.

## 2021-06-28 NOTE — Discharge Instructions (Addendum)
You have a ureteral stent in place.  This is a tube that extends from your kidney to your bladder.  This may cause urinary bleeding, burning with urination, and urinary frequency.  Please call our office or present to the ED if you develop fevers >101 or pain which is not able to be controlled with oral pain medications.  You may be given either Flomax and/ or ditropan to help with bladder spasms and stent pain in addition to pain medications.    [facility] [address], [address] [place], [place] [address] [phone]  AMBULATORY SURGERY  DISCHARGE INSTRUCTIONS   The drugs that you were given will stay in your system until tomorrow so for the next 24 hours you should not:  Drive an automobile Make any legal decisions Drink any alcoholic beverage   You may resume regular meals tomorrow.  Today it is better to start with liquids and gradually work up to solid foods.  You may eat anything you prefer, but it is better to start with liquids, then soup and crackers, and gradually work up to solid foods.   Please notify your doctor immediately if you have any unusual bleeding, trouble breathing, redness and pain at the surgery site, drainage, fever, or pain not relieved by medication.    Additional Instructions:   Please contact your physician with any problems or Same Day Surgery at [phone], Monday through Friday 6 am to 4 pm, or [place] [facility] [place] Main number at [phone].  

## 2021-06-28 NOTE — H&P (Signed)
06/28/21  [facility] CTAB   Denise Burns 02-Sep-1970 [id number]   Referring provider: [name], NP [place],  [place] [address]      Chief Complaint  Patient presents with   Staghorn calculus      HPI: 50 year old female who presents today for postop visit.  She underwent uncomplicated left PCNL on 12/15/2020.  She follows up today with renal ultrasound which shows no further stone burden in her left kidney without hydronephrosis.  She does have a known contralateral stone measuring 1.1 cm in the right lower pole.  This is appreciated on follow-up renal ultrasound.  Stone analysis was reviewed today, stone consistent with 60% struvite, 40% carbonate Apatate.  She tolerated the procedure well.  She denies any flank pain today.  She is interested in treating her contralateral stone.  She does have some stressors right now, her mother was just diagnosed with brain and lung cancer and will be undergoing craniotomy and chemotherapy in the near future.     PMH:     Past Medical History:  Diagnosis Date   Addison disease ([place])     Arthritis     Back pain     Complication of anesthesia      nausea   GERD (gastroesophageal reflux disease)        Surgical History:      Past Surgical History:  Procedure Laterality Date   CHOLECYSTECTOMY   2010   dental work       DIAGNOSTIC LAPAROSCOPY   2004    endometriosis dx   DILATION AND CURETTAGE OF UTERUS   2010   IR NEPHROSTOMY PLACEMENT LEFT   12/14/2020   NEPHROLITHOTOMY Left 12/14/2020    Procedure: NEPHROLITHOTOMY PERCUTANEOUS;  Surgeon: Hollice Espy, MD;  Location: [facility] ORS;  Service: Urology;  Laterality: Left;   POLYPECTOMY       WISDOM TOOTH EXTRACTION          Home Medications:  Allergies as of 02/16/2021         Reactions    Lisinopril Cough    Other reaction(s): rash    Sulfacetamide Sodium (acne)      Other reaction(s): sores    Tramadol Other (See Comments)    Other  reaction(s): muscle tension Other reaction(s): muscle tension    Sulfa Antibiotics Other (See Comments)    Blisters in mouth             Medication List           Accurate as of February 16, 2021  1:17 PM. If you have any questions, ask your nurse or doctor.              STOP taking these medications     promethazine 25 MG tablet Commonly known as: PHENERGAN Stopped by: Hollice Espy, MD           TAKE these medications     azelastine 0.1 % nasal spray Commonly known as: ASTELIN Place 1 spray into both nostrils 2 (two) times daily. What changed: Another medication with the same name was removed. Continue taking this medication, and follow the directions you see here. Changed by: Hollice Espy, MD    Flonase Sensimist 27.5 MCG/SPRAY nasal spray Generic drug: fluticasone Place 1 spray into the nose in the morning and at bedtime.    fludrocortisone 0.1 MG tablet Commonly known as: FLORINEF TAKE 1/2 TABLET BY MOUTH DAILY What changed: how much to take  HYDROcodone-acetaminophen 7.5-325 MG tablet Commonly known as: NORCO Take 1 tablet by mouth every 8 (eight) hours as needed for severe pain. Must last 30 days. What changed: Another medication with the same name was removed. Continue taking this medication, and follow the directions you see here. Changed by: Hollice Espy, MD    hydrocortisone 10 MG tablet Commonly known as: CORTEF Take 1 and 1/2 tablets by mouth daily with breakfast and 1/2 tablet daily in the evening    meloxicam 15 MG tablet Commonly known as: MOBIC TAKE 1 TABLET BY MOUTH DAILY    Plenvu 140 g Solr Generic drug: PEG-KCl-NaCl-NaSulf-Na Asc-C Take as directed for colonoscopy prep (Take 1 kit by mouth as directed. Manufacturer's coupon Universal coupon code:BIN: P2366821; GROUP: [id number]; PCN: [id number]; ID: [id number]; PAY NO MORE $50; NO prior authorization)    pseudoephedrine 30 MG tablet Commonly known as: SUDAFED Take 30 mg by mouth every  12 (twelve) hours as needed for congestion.    tiZANidine 4 MG tablet Commonly known as: ZANAFLEX Take 1 tablet (4 mg total) by mouth every 8 (eight) hours as needed for muscle spasms.             Allergies:       Allergies  Allergen Reactions   Lisinopril Cough      Other reaction(s): rash   Sulfacetamide Sodium (Acne)        Other reaction(s): sores   Tramadol Other (See Comments)      Other reaction(s): muscle tension Other reaction(s): muscle tension   Sulfa Antibiotics Other (See Comments)      Blisters in mouth       Family History:      Family History  Problem Relation Age of Onset   Arthritis Mother     Hyperlipidemia Mother     Hypertension Mother     High blood pressure Mother     Alcohol abuse Father     Hearing loss Father     Hyperlipidemia Father     High blood pressure Father     Colon polyps Maternal Grandmother 66   Colon cancer Neg Hx     Esophageal cancer Neg Hx     Stomach cancer Neg Hx     Rectal cancer Neg Hx        Social History:  reports that she quit smoking about 26 years ago. Her smoking use included cigarettes. She has never used smokeless tobacco. She reports previous alcohol use. She reports that she does not use drugs.     Physical Exam: BP (!) 151/94   Pulse 80   Ht 5' 4"  (1.626 m)   Wt 180 lb (81.6 kg)   LMP 12/01/2020 (Approximate)   BMI 30.90 kg/m   Constitutional:  Alert and oriented, No acute distress. HEENT: [place] AT, moist mucus membranes.  Trachea midline, no masses. Cardiovascular: No clubbing, cyanosis, or edema. Respiratory: Normal respiratory effort, no increased work of breathing. GU: Left flank incision healed well Skin: No rashes, bruises or suspicious lesions. Neurologic: Grossly intact, no focal deficits, moving all 4 extremities. Psychiatric: Normal mood and affect.   Laboratory Data: Recent Labs       Lab Results  Component Value Date    WBC 10.1 12/14/2020    HGB 11.8 (L) 12/15/2020    HCT 34.6  (L) 12/15/2020    MCV 92.9 12/14/2020    PLT 223 12/14/2020        Recent Labs       Lab  Results  Component Value Date    CREATININE 0.74 12/15/2020        Recent Labs       Lab Results  Component Value Date    HGBA1C 6.1 04/30/2018        Pertinent Imaging:   US RENAL   Narrative CLINICAL DATA:  Left staghorn calculus follow-up.   EXAM: RENAL / URINARY TRACT ULTRASOUND COMPLETE   COMPARISON:  CT scan October 06, 2020   FINDINGS: Right Kidney:   Renal measurements: 12.7 x 5.5 x 5.2 cm = volume: 190 mL. There is a nonobstructive shadowing stone measuring up to 1.3 cm in the lower pole of the right kidney. No hydronephrosis.   Left Kidney:   Renal measurements: 12.7 x 5.7 x 5.3 cm = volume: 201 mL. The previously identified left staghorn calculus is not visualized.   Bladder:   Appears normal for degree of bladder distention.   Other:   None.   IMPRESSION: 1. 13 mm nonobstructive stone in the lower right kidney. 2. The previously identified left staghorn calculus is not visualized. 3. No hydronephrosis bilaterally. 4. No other abnormalities.     Electronically Signed By: Dorise Bullion III M.D On: 01/21/2021 07:18   Renal ultrasound personally reviewed today, agree with radiologic interpretation   Assessment & Plan:     1. Staghorn calculus Status post uncomplicated left PCNL  Stone analysis reviewed  Follow-up renal ultrasound is reassuring   2. Right kidney stone Approximately 1.1 cm right nonobstructing lower pole stone   Given her history of infection stone, contralateral staghorn, recurrent pyelonephritis/UTIs, would strongly recommend treatment for this stone.   SWL has a lower stone free rate in a single procedure, but also a lower complication rate compared to ureteroscopy and avoids a stent and associated stent related symptoms. Possible complications include renal hematoma, steinstrasse, and need for additional treatment. We  discussed the role of his increased skin to stone distance can lead to decreased efficacy with shockwave lithotripsy.   Ureteroscopy with laser lithotripsy and stent placement has a higher stone free rate than SWL in a single procedure, however increased complication rate including possible infection, ureteral injury, bleeding, and stent related morbidity. Common stent related symptoms include dysuria, urgency/frequency, and flank pain.   After an extensive discussion of the risks and benefits of the above treatment options, the patient would like to proceed with URS; will likely wait a few months to help her mother through her cancer treatment given that she is currently asymptomatic   Hollice Espy, MD   [place] [address] [place], [place] [address] [phone]

## 2021-06-28 NOTE — Transfer of Care (Signed)
Immediate Anesthesia Transfer of Care Note  Patient: Denise Burns  Procedure(s) Performed: CYSTOSCOPY/URETEROSCOPY/STENT PLACEMENT (Right: Ureter)  Patient Location: PACU  Anesthesia Type:General  Level of Consciousness: awake and drowsy  Airway & Oxygen Therapy: Patient Spontanous Breathing and Patient connected to nasal cannula oxygen  Post-op Assessment: Report given to RN and Post -op Vital signs reviewed and stable  Post vital signs: stable  Last Vitals:  Vitals Value Taken Time  BP 148/85 06/28/21 1041  Temp    Pulse 65 06/28/21 1043  Resp 18 06/28/21 1043  SpO2 100 % 06/28/21 1043  Vitals shown include unvalidated device data.  Last Pain:  Vitals:   06/28/21 0854  TempSrc: Temporal  PainSc: 4          Complications: No notable events documented.

## 2021-06-28 NOTE — Anesthesia Procedure Notes (Signed)
Procedure Name: Intubation Date/Time: 06/28/2021 10:08 AM Performed by: Natasha Mead, CRNA Pre-anesthesia Checklist: Patient identified, Emergency Drugs available, Suction available and Patient being monitored Patient Re-evaluated:Patient Re-evaluated prior to induction Oxygen Delivery Method: Circle system utilized Preoxygenation: Pre-oxygenation with 100% oxygen Induction Type: IV induction Ventilation: Mask ventilation without difficulty Laryngoscope Size: McGraph and 3 Grade View: Grade II Tube type: Oral Tube size: 7.0 mm Number of attempts: 1 Airway Equipment and Method: Stylet and Oral airway Placement Confirmation: ETT inserted through vocal cords under direct vision, positive ETCO2 and breath sounds checked- equal and bilateral Secured at: 20 cm Tube secured with: Tape Dental Injury: Teeth and Oropharynx as per pre-operative assessment

## 2021-06-29 NOTE — Anesthesia Postprocedure Evaluation (Signed)
Anesthesia Post Note  Patient: Denise Burns  Procedure(s) Performed: CYSTOSCOPY/URETEROSCOPY/STENT PLACEMENT (Right: Ureter)  Patient location during evaluation: PACU Anesthesia Type: General Level of consciousness: awake and alert Pain management: pain level controlled Vital Signs Assessment: post-procedure vital signs reviewed and stable Respiratory status: spontaneous breathing, nonlabored ventilation, respiratory function stable and patient connected to nasal cannula oxygen Cardiovascular status: blood pressure returned to baseline and stable Postop Assessment: no apparent nausea or vomiting Anesthetic complications: no   No notable events documented.   Last Vitals:  Vitals:   06/28/21 1121 06/28/21 1144  BP:  (!) 151/103  Pulse:  70  Resp:  18  Temp: 36.4 C 36.4 C  SpO2:  100%    Last Pain:  Vitals:   06/28/21 1144  TempSrc: Temporal  PainSc: 2                  Molli Barrows

## 2021-06-30 ENCOUNTER — Other Ambulatory Visit: Payer: Self-pay | Admitting: Internal Medicine

## 2021-06-30 ENCOUNTER — Other Ambulatory Visit: Payer: Self-pay

## 2021-06-30 ENCOUNTER — Encounter: Payer: Self-pay | Admitting: Urology

## 2021-06-30 MED ORDER — FLUDROCORTISONE ACETATE 0.1 MG PO TABS
ORAL_TABLET | Freq: Every day | ORAL | 2 refills | Status: DC
  Filled 2021-06-30: qty 45, 90d supply, fill #0

## 2021-06-30 NOTE — Telephone Encounter (Signed)
Thank you for sending this message.  I think your urgency frequency should get better as you get used to the stent.  Initially, most patients have a period of irritation that last for several days and then with time, tends to get a little bit better.  You can use oxybutynin Flomax to help with this.  If this is not cutting it, you can stop our office and pick up some samples of either Mybetriq or Gemtesa which can be taken in addition to oxybutynin for bladder spasms and urgency.  Hopefully you are only having some flank pressure and discomfort when you urinate which honestly will probably persist.  In terms of timing of your staged procedure, I do want a make sure that the ureter has healed up completely before we manipulate it again.  My plan was to bring you back at the beginning of the new year (early to mid Jan 2023), take out the stent and inject contrast to see if the ureter is healed.  If it is, we can go up and take care of the stone.  If we go back to early in the ureter is not completely healed, then we will be able to treat the stone which defeats all of this.  I do not want a put you in any danger or having you undergo any unnecessary additional procedures.  I have CCed my staff on this message who will reach out to you to get your staged procedure scheduled.  Hollice Espy, MD

## 2021-07-05 ENCOUNTER — Other Ambulatory Visit: Payer: Self-pay

## 2021-07-08 ENCOUNTER — Other Ambulatory Visit: Payer: Self-pay | Admitting: *Deleted

## 2021-07-08 DIAGNOSIS — N200 Calculus of kidney: Secondary | ICD-10-CM

## 2021-07-09 ENCOUNTER — Other Ambulatory Visit: Payer: Self-pay

## 2021-07-09 ENCOUNTER — Other Ambulatory Visit: Payer: BC Managed Care – PPO

## 2021-07-09 DIAGNOSIS — N200 Calculus of kidney: Secondary | ICD-10-CM

## 2021-07-09 LAB — URINALYSIS, COMPLETE
Specific Gravity, UA: 1.02 (ref 1.005–1.030)
pH, UA: 6 (ref 5.0–7.5)

## 2021-07-09 LAB — MICROSCOPIC EXAMINATION
RBC, Urine: >30 /hpf — ABNORMAL HIGH (ref 0–2)
WBC, UA: >30 /hpf — ABNORMAL HIGH (ref 0–5)

## 2021-07-09 MED ORDER — CEPHALEXIN 500 MG PO CAPS
500.0000 mg | ORAL_CAPSULE | Freq: Four times a day (QID) | ORAL | 0 refills | Status: DC
  Filled 2021-07-09: qty 28, 7d supply, fill #0

## 2021-07-09 NOTE — Addendum Note (Signed)
Addended by: Verlene Mayer A on: 07/09/2021 12:10 PM   Modules accepted: Orders

## 2021-07-12 ENCOUNTER — Telehealth: Payer: Self-pay

## 2021-07-12 NOTE — Telephone Encounter (Signed)
LM for pre virtual appointment questions. 

## 2021-07-12 NOTE — Progress Notes (Signed)
Patient: Denise Burns  Service Category: E/M  Provider: [name], MD  DOB: March 20, 1971  DOS: 07/13/2021  Location: Office  MRN: [id number]  Setting: Ambulatory outpatient  Referring Provider: [name], NP  Type: Established Patient  Specialty: Interventional Pain Management  PCP: [name], NP  Location: Remote location  Delivery: TeleHealth     Virtual Encounter - Pain Management PROVIDER NOTE: Information contained herein reflects review and annotations entered in association with encounter. Interpretation of such information and data should be left to medically-trained personnel. Information provided to patient can be located elsewhere in the medical record under "Patient Instructions". Document created using STT-dictation technology, any transcriptional errors that may result from process are unintentional.    Contact & Pharmacy Preferred: [phone] Home: [phone] (home) Mobile: [phone] (mobile) E-mail: [email or web address]  [place] [address] Phone: [phone] Fax: [phone]  [place] [address] Phone: [phone] Fax: [phone]   Pre-screening  [name] offered "in-person" vs "virtual" encounter. She indicated preferring virtual for this encounter.   Reason COVID-19*  Social distancing based on CDC and AMA recommendations.   I contacted Denise Burns on 07/13/2021 via telephone.      I clearly identified myself as [name], MD. I verified that I was speaking with the correct person using two identifiers (Name: Denise Burns, and date of birth: November 08, 1970).  Consent I sought verbal advanced consent from Denise Burns for virtual visit interactions. I informed Denise Burns of possible security and privacy concerns, risks, and limitations associated with providing  "not-in-person" medical evaluation and management services. I also informed Denise Burns of the availability of "in-person" appointments. Finally, I informed her that there would be a charge for the virtual visit and that she could be  personally, fully or partially, financially responsible for it. Denise Burns expressed understanding and agreed to proceed.   Historic Elements   Denise Burns is a 50 y.o. year old, female patient evaluated today after our last contact on 06/15/2021. Denise Burns  has a past medical history of Addison disease ([place]), Arthritis, Back pain, Complication of anesthesia, and GERD (gastroesophageal reflux disease). She also  has a past surgical history that includes Cholecystectomy (2010); Polypectomy; dental work; Dilation and curettage of uterus (2010); IR NEPHROSTOMY PLACEMENT LEFT (12/14/2020); Nephrolithotomy (Left, 12/14/2020); Wisdom tooth extraction; Diagnostic laparoscopy (2004); and Cystoscopy/ureteroscopy/holmium laser/stent placement (Right, 06/28/2021). Denise Burns has a current medication list which includes the following prescription(s): azelastine, cephalexin, chlorpheniramine-hydrocodone, diazepam, fludrocortisone, flonase sensimist, hydrocortisone, meloxicam, oxybutynin, oxycodone, plenvu, phenazopyridine, pseudoephedrine, tamsulosin, tizanidine, and [START ON 08/04/2021] hydrocodone-acetaminophen. She  reports that she quit smoking about 26 years ago. Her smoking use included cigarettes. She has never used smokeless tobacco. She reports that she does not currently use alcohol. She reports that she does not use drugs. Denise Burns is allergic to lisinopril, sulfacetamide sodium (acne), tramadol, and sulfa antibiotics.   HPI  Today, she is being contacted for both, medication management and a post-procedure assessment.  The patient indicates doing well with the current medication regimen. No adverse reactions or side effects reported to the medications.  The  patient indicates having attained a 100% relief of the pain for the duration of the local anesthetic, followed by a decrease to an 85% ongoing relief of the pain in the area of the hip and trochanteric bursa.  RTCB:  09/03/2021 Nonopioids transferred 05/11/2020: Baclofen and Mobic  Post-Procedure Evaluation  Procedure(s):    Procedure #1:  Anesthesia, Analgesia, Anxiolysis:  Type: Intra-Articular Hip Injection          Primary Purpose: Diagnostic Region: Posterolateral hip joint area. Level: Lower pelvic and hip joint level. Target Area: Superior aspect of the hip joint cavity, going thru the superior portion of the capsular ligament. Approach: Posterolateral approach. Laterality: Bilateral  Anesthesia: Local (1-2% Lidocaine)  Anxiolysis: None  Sedation: None  Guidance: Fluoroscopy           Position: Prone Area Prepped: Entire Posterolateral hip area. DuraPrep (Iodine Povacrylex [0.7% available iodine] and Isopropyl Alcohol, 74% w/w)   Procedure #2:    Type: Iliopsoas Bursa Injection          Primary Purpose: Diagnostic Region: Upper (proximal) Femoral Region Level: Hip Joint Target Area: Superior aspect of the hip joint cavity, going thru the superior portion of the capsular ligament. Approach: Posterolateral approach Laterality: Right     Indications: 1. Chronic hip pain (Bilateral)   2. Chronic groin pain (Right)   3. Osteoarthritis of hips (Bilateral)   4. Enthesopathy hip region (Left)   5. Greater trochanteric bursitis (Bilateral)    Pain Score: Pre-procedure: 6 /10 Post-procedure: 6 /10    Anxiolysis: Please see nurses note.  Effectiveness during initial hour after procedure (Ultra-Short Term Relief): 100 %.  Local anesthetic used: Long-acting (4-6 hours) Effectiveness: Defined as any analgesic benefit obtained secondary to the administration of local anesthetics. This carries significant diagnostic value as to the etiological location, or anatomical origin, of the  pain. Duration of benefit is expected to coincide with the duration of the local anesthetic used.  Effectiveness during initial 4-6 hours after procedure (Short-Term Relief): 100 %.  Long-term benefit: Defined as any relief past the pharmacologic duration of the local anesthetics.  Effectiveness past the initial 6 hours after procedure (Long-Term Relief): 95 %.  Benefits, current: Defined as benefit present at the time of this evaluation.   Analgesia: The patient indicates currently enjoying an ongoing 95% relief of the pain in the area of the trochanteric bursa. Function: Denise Burns reports improvement in function ROM: Denise Burns reports improvement in ROM  Pharmacotherapy Assessment   Analgesic: Hydrocodone/APAP 7.5/325 1 tablet p.o. 3 times daily (22.5 mg/day of hydrocodone)  MME/day: 22.5 mg/day.   Monitoring: [place] PMP: PDMP reviewed during this encounter.       Pharmacotherapy: No side-effects or adverse reactions reported. Compliance: No problems identified. Effectiveness: Clinically acceptable. Plan: Refer to "POC". UDS:  Summary  Date Value Ref Range Status  01/27/2021 Note  Final    Comment:    ==================================================================== [employer] Select 13 (MW) ==================================================================== Test                             Result       Flag       Units  Drug Present and Declared for Prescription Verification   Hydrocodone                    544          EXPECTED   ng/mg creat   Hydromorphone                  282          EXPECTED   ng/mg creat   Dihydrocodeine  100          EXPECTED   ng/mg creat   Norhydrocodone                 735          EXPECTED   ng/mg creat    Sources of hydrocodone include scheduled prescription medications.    Hydromorphone, dihydrocodeine and norhydrocodone are expected    metabolites of hydrocodone. Hydromorphone and dihydrocodeine are    also available as  scheduled prescription medications.  ==================================================================== Test                      Result    Flag   Units      Ref Range   Creatinine              171              mg/dL      >=20 ==================================================================== Declared Medications:  The flagging and interpretation on this report are based on the  following declared medications.  Unexpected results may arise from  inaccuracies in the declared medications.   **Note: The testing scope of this panel includes these medications:   Hydrocodone (Norco)   **Note: The testing scope of this panel does not include the  following reported medications:   Acetaminophen (Norco)  Azelastine  Fludrocortisone (Florinef)  Fluticasone (Flonase)  Hydrocortisone (Cortef)  Meloxicam  Polyethylene Glycol (Plenvu)  Promethazine  Pseudoephedrine (Sudafed)  Tizanidine ==================================================================== For clinical consultation, please call [phone]. ====================================================================      Laboratory Chemistry Profile   Renal Lab Results  Component Value Date   BUN 17 04/23/2021   CREATININE 0.83 04/23/2021   BCR 20 04/23/2021   GFR 72.11 08/13/2019   GFRAA 86 08/16/2018   GFRNONAA >60 12/15/2020    Hepatic Lab Results  Component Value Date   AST 21 04/23/2021   ALT 27 04/23/2021   ALBUMIN 4.3 04/23/2021   ALKPHOS 57 04/23/2021   LIPASE 30 10/06/2020    Electrolytes Lab Results  Component Value Date   NA 142 04/23/2021   K 4.0 04/23/2021   CL 104 04/23/2021   CALCIUM 9.4 04/23/2021   MG 2.1 08/16/2018    Bone Lab Results  Component Value Date   VD25OH 13.55 (L) 04/30/2018   25OHVITD1 67 08/16/2018   25OHVITD2 <1.0 08/16/2018   25OHVITD3 67 08/16/2018    Inflammation (CRP: Acute Phase) (ESR: Chronic Phase) Lab Results  Component Value Date   CRP 2 08/16/2018    ESRSEDRATE 37 (H) 08/16/2018   LATICACIDVEN 1.4 10/06/2020         Note: Above Lab results reviewed.  Imaging  DG OR UROLOGY CYSTO IMAGE ([place]) There is no interpretation for this exam.    This order is for images obtained during a surgical procedure.  Please See  "Surgeries" Tab for more information regarding the procedure.  Assessment  The primary encounter diagnosis was Chronic hip pain (Bilateral). Diagnoses of Chronic groin pain (Right), Greater trochanteric bursitis (Bilateral), Chronic low back pain (1ry area of Pain) (Bilateral) (R>L) w/o sciatica, Lumbar facet syndrome (Bilateral), Chronic sacroiliac joint pain (2ry area of Pain) (Right), Chronic pain syndrome, Pharmacologic therapy, Chronic use of opiate for therapeutic purpose, and Encounter for medication management were also pertinent to this visit.  Plan of Care  Problem-specific:  No problem-specific Assessment & Plan notes found for this encounter.  Denise Burns has a current medication list which includes  the following long-term medication(s): azelastine, flonase sensimist, meloxicam, and [START ON 08/04/2021] hydrocodone-acetaminophen.  Pharmacotherapy (Medications Ordered): Meds ordered this encounter  Medications   HYDROcodone-acetaminophen (NORCO) 7.5-325 MG tablet    Sig: Take 1 tablet by mouth every 8 (eight) hours as needed for severe pain. Must last 30 days.    Dispense:  90 tablet    Refill:  0    DO NOT: delete (not duplicate); no partial-fill (will deny script to complete), no refill request (F/U required). DISPENSE: 1 day early if closed on fill date. WARN: No CNS-depressants within 8 hrs of med.    Orders:  No orders of the defined types were placed in this encounter.  Follow-up plan:   Return in about 7 weeks (around 09/03/2021) for Eval-day (M,W), (F2F), (MM).     Interventional Therapies  Risk  Complexity Considerations:   Estimated body mass index is 30.9 kg/m as calculated from  the following:   Height as of this encounter: 5' 4" (1.626 m).   Weight as of this encounter: 180 lb (81.6 kg). WNL   Planned  Pending:   Diagnostic/therapeutic bilateral trochanteric bursa injection #1    Under consideration:   Diagnostic L2-3 L-FCT BLK #1  Diagnostic bilateral SI joint Blk  Possible bilateral SI joint RFA  Diagnostic bilateral L5 TFESI  Diagnostic left-sided L2 TFESI    Completed:   Therapeutic bilateral lumbar facet block x3 (09/08/2020) (4-0) (100/100/60/60)  Therapeutic right lumbar facet RFA x1 (01/07/2020) (4-0) (100/100/90/>75)  Therapeutic left lumbar facet RFA x1 (02/04/2020) (4-0) (100/100/90/>75)  Therapeutic right IA hip inj. x2 (08/20/2020) (5-1) (100/100/80/80)  Therapeutic left IA hip inj. x1 (08/20/2020) (5-1) (100/100/80/80)    Therapeutic  Palliative (PRN) options:   Palliative bilateral lumbar facet MBB #4  Palliative right lumbar facet RFA #2 (last done 01/07/2020) Palliative left lumbar facet RFA #2 (last done 02/04/2020)     Recent Visits Date Type Provider Dept  06/15/21 Procedure visit Milinda Pointer, MD [facility]  04/21/21 Office Visit Milinda Pointer, MD [facility]  Showing recent visits within past 90 days and meeting all other requirements Today's Visits Date Type Provider Dept  07/13/21 Office Visit Milinda Pointer, MD [facility]  Showing today's visits and meeting all other requirements Future Appointments Date Type Provider Dept  07/28/21 Appointment Milinda Pointer, MD [facility]  Showing future appointments within next 90 days and meeting all other requirements I discussed the assessment and treatment plan with the patient. The patient was provided an opportunity to ask questions and all were answered. The patient agreed with the plan and demonstrated an understanding of the instructions.  Patient advised to call back or seek an in-person evaluation if the symptoms or  condition worsens.  Duration of encounter: 15 minutes.  Note by: [name], MD Date: 07/13/2021; Time: 2:04 PM

## 2021-07-13 ENCOUNTER — Ambulatory Visit: Payer: BC Managed Care – PPO | Attending: Pain Medicine | Admitting: Pain Medicine

## 2021-07-13 ENCOUNTER — Other Ambulatory Visit: Payer: Self-pay

## 2021-07-13 DIAGNOSIS — M7062 Trochanteric bursitis, left hip: Secondary | ICD-10-CM

## 2021-07-13 DIAGNOSIS — M25551 Pain in right hip: Secondary | ICD-10-CM

## 2021-07-13 DIAGNOSIS — G8929 Other chronic pain: Secondary | ICD-10-CM

## 2021-07-13 DIAGNOSIS — M25552 Pain in left hip: Secondary | ICD-10-CM

## 2021-07-13 DIAGNOSIS — M545 Low back pain, unspecified: Secondary | ICD-10-CM

## 2021-07-13 DIAGNOSIS — R1031 Right lower quadrant pain: Secondary | ICD-10-CM | POA: Diagnosis not present

## 2021-07-13 DIAGNOSIS — G894 Chronic pain syndrome: Secondary | ICD-10-CM

## 2021-07-13 DIAGNOSIS — Z79891 Long term (current) use of opiate analgesic: Secondary | ICD-10-CM

## 2021-07-13 DIAGNOSIS — M7061 Trochanteric bursitis, right hip: Secondary | ICD-10-CM

## 2021-07-13 DIAGNOSIS — Z79899 Other long term (current) drug therapy: Secondary | ICD-10-CM

## 2021-07-13 DIAGNOSIS — M47816 Spondylosis without myelopathy or radiculopathy, lumbar region: Secondary | ICD-10-CM

## 2021-07-13 DIAGNOSIS — M533 Sacrococcygeal disorders, not elsewhere classified: Secondary | ICD-10-CM

## 2021-07-13 LAB — CULTURE, URINE COMPREHENSIVE

## 2021-07-13 MED ORDER — HYDROCODONE-ACETAMINOPHEN 7.5-325 MG PO TABS
1.0000 | ORAL_TABLET | Freq: Three times a day (TID) | ORAL | 0 refills | Status: DC | PRN
  Filled 2021-08-04 (×4): qty 90, 30d supply, fill #0

## 2021-07-13 NOTE — Telephone Encounter (Signed)
Spoke with patient-having dysuria after urinating a little more often than normal. Still taking Keflex, gemtesa with oxybutynin and pyridium. Has one more dose of pyridium left. Explained and reviewed indications of still having a stent in place. Denies any other symptoms.

## 2021-07-15 ENCOUNTER — Other Ambulatory Visit: Payer: Self-pay

## 2021-07-15 ENCOUNTER — Other Ambulatory Visit: Payer: Self-pay | Admitting: Internal Medicine

## 2021-07-15 MED ORDER — HYDROCORTISONE 10 MG PO TABS
ORAL_TABLET | ORAL | 1 refills | Status: DC
  Filled 2021-07-15: qty 180, 90d supply, fill #0

## 2021-07-15 MED ORDER — UROGESIC-BLUE 81.6 MG PO TABS
81.6000 mg | ORAL_TABLET | Freq: Four times a day (QID) | ORAL | 0 refills | Status: AC | PRN
  Filled 2021-07-15: qty 30, 8d supply, fill #0
  Filled 2021-07-16: qty 90, 22d supply, fill #0

## 2021-07-15 NOTE — Addendum Note (Signed)
Addended by: Verlene Mayer A on: 07/15/2021 12:15 PM   Modules accepted: Orders

## 2021-07-16 ENCOUNTER — Other Ambulatory Visit: Payer: Self-pay

## 2021-07-19 ENCOUNTER — Other Ambulatory Visit: Payer: Self-pay

## 2021-07-22 ENCOUNTER — Ambulatory Visit: Payer: BC Managed Care – PPO | Admitting: Physician Assistant

## 2021-07-22 ENCOUNTER — Other Ambulatory Visit: Payer: Self-pay

## 2021-07-22 ENCOUNTER — Encounter: Payer: Self-pay | Admitting: Physician Assistant

## 2021-07-22 VITALS — BP 145/83 | HR 111 | Ht 64.0 in | Wt 186.0 lb

## 2021-07-22 DIAGNOSIS — R300 Dysuria: Secondary | ICD-10-CM

## 2021-07-22 NOTE — H&P (View-Only) (Signed)
07/22/2021 1:58 PM   Denise Burns 26-Sep-1970 [id number]  CC: Chief Complaint  Patient presents with   Dysuria   HPI: Denise Burns is a 50 y.o. female with PMH nephrolithiasis who currently has a right ureteral stent in place after experiencing a right proximal ureteral perforation during right ureteroscopy last month who presents today for evaluation of possible UTI.   She has been struggling with stent discomfort since her recent ureteroscopy and is currently taking Uribel, oxycodone, Gemtesa, and tamsulosin.  She was also recently treated for Proteus UTI with Keflex.  Today she reports some cloudy urine and dysuria with concerns for possible recurrent versus persistent UTI.  In-office UA today positive for 3+ glucose; urine microscopy pan negative.  PMH: Past Medical History:  Diagnosis Date   Addison disease ([place])    Arthritis    Back pain    Complication of anesthesia    nausea   GERD (gastroesophageal reflux disease)     Surgical History: Past Surgical History:  Procedure Laterality Date   CHOLECYSTECTOMY  2010   CYSTOSCOPY/URETEROSCOPY/HOLMIUM LASER/STENT PLACEMENT Right 06/28/2021   Procedure: CYSTOSCOPY/URETEROSCOPY/STENT PLACEMENT;  Surgeon: Hollice Espy, MD;  Location: [facility];  Service: Urology;  Laterality: Right;   dental work     DIAGNOSTIC LAPAROSCOPY  2004   endometriosis dx   DILATION AND CURETTAGE OF UTERUS  2010   IR NEPHROSTOMY PLACEMENT LEFT  12/14/2020   NEPHROLITHOTOMY Left 12/14/2020   Procedure: NEPHROLITHOTOMY PERCUTANEOUS;  Surgeon: Hollice Espy, MD;  Location: [facility];  Service: Urology;  Laterality: Left;   POLYPECTOMY     WISDOM TOOTH EXTRACTION      Home Medications:  Allergies as of 07/22/2021       Reactions   Lisinopril Cough   Other reaction(s): rash   Sulfacetamide Sodium (acne)    Other reaction(s): sores   Tramadol Other (See Comments)   Other reaction(s): muscle tension Other reaction(s): muscle  tension   Sulfa Antibiotics Other (See Comments)   Blisters in mouth         Medication List        Accurate as of July 22, 2021  1:58 PM. If you have any questions, ask your nurse or doctor.          STOP taking these medications    cephALEXin 500 MG capsule Commonly known as: Keflex Stopped by: Debroah Loop, PA-C       TAKE these medications    Azelastine HCl 137 MCG/SPRAY Soln Place 1 spray into both nostrils 2 (two) times daily.   chlorpheniramine-HYDROcodone 10-8 MG/5ML Suer Commonly known as: Tussionex Pennkinetic ER Take 5 mLs by mouth every 12 (twelve) hours as needed for cough.   diazepam 5 MG tablet Commonly known as: Valium Take one tablet by mouth 60 minutes before scheduled procedure. Wait 30 minutes. If still anxious, take the second tablet 30 minutes before procedure. Must have a driver. (Take 1-2 tablets (5-10 mg total) by mouth 60 (sixty) minutes before procedure for 1 dose. Take one (1) tab (5 mg) 60 minutes before scheduled procedure. Wait 30 minutes. If still anxious, take the second (5 mg) tab 30 min before procedure.)   Flonase Sensimist 27.5 MCG/SPRAY nasal spray Generic drug: fluticasone Place 1 spray into the nose in the morning and at bedtime.   fludrocortisone 0.1 MG tablet Commonly known as: FLORINEF TAKE 1/2 TABLET BY MOUTH DAILY   HYDROcodone-acetaminophen 7.5-325 MG tablet Commonly known as: NORCO Take 1 tablet by mouth every  eight) hours as needed for severe pain. Must last 30 days. °Start taking on: August 04, 2021 °  °hydrocortisone 10 MG tablet °Commonly known as: CORTEF °Take 1 and 1/2 tablets by mouth daily with breakfast and 1/2 tablet daily in the evening °  °meloxicam 15 MG tablet °Commonly known as: MOBIC °TAKE 1 TABLET BY MOUTH DAILY °  °oxybutynin 5 MG tablet °Commonly known as: DITROPAN °Take 1 tablet (5 mg total) by mouth every 8 (eight) hours as needed for bladder spasms. °  °oxyCODONE 5 MG immediate  release tablet °Commonly known as: Roxicodone °Take 1 tablet (5 mg total) by mouth every 4 (four) hours as needed for severe pain. °  °phenazopyridine 100 MG tablet °Commonly known as: PYRIDIUM °Take 2 tablets (200 mg total) by mouth 3 (three) times daily as needed for pain. °  °Plenvu 140 g Solr °Generic drug: PEG-KCl-NaCl-NaSulf-Na Asc-C °Take as directed for colonoscopy prep °(Take 1 kit by mouth as directed. Manufacturer's coupon Universal coupon code:BIN: 019158; GROUP: [id number]; PCN: [id number]; ID: [id number]; PAY NO MORE $50; NO prior authorization) °  °pseudoephedrine 30 MG tablet °Commonly known as: SUDAFED °Take 30 mg by mouth every 12 (twelve) hours as needed for congestion. °  °tamsulosin 0.4 MG Caps capsule °Commonly known as: Flomax °Take 1 capsule (0.4 mg total) by mouth daily. °  °tiZANidine 4 MG tablet °Commonly known as: ZANAFLEX °Take 1 tablet (4 mg total) by mouth every 8 (eight) hours as needed for muscle spasms. °  °Urogesic-Blue 81.6 MG Tabs °Take 1 tablet (81.6 mg total) by mouth 4 (four) times daily as needed. °  ° °  ° ° °Allergies:  °Allergies  °Allergen Reactions  ° Lisinopril Cough  °  Other reaction(s): rash  ° Sulfacetamide Sodium (Acne)   °  Other reaction(s): sores  ° Tramadol Other (See Comments)  °  Other reaction(s): muscle tension °Other reaction(s): muscle tension  ° Sulfa Antibiotics Other (See Comments)  °  Blisters in mouth   ° ° °Family History: °Family History  °Problem Relation Age of Onset  ° Arthritis Mother   ° Hyperlipidemia Mother   ° Hypertension Mother   ° High blood pressure Mother   ° Alcohol abuse Father   ° Hearing loss Father   ° Hyperlipidemia Father   ° High blood pressure Father   ° Colon polyps Maternal Grandmother 80  ° Colon cancer Neg Hx   ° Esophageal cancer Neg Hx   ° Stomach cancer Neg Hx   ° Rectal cancer Neg Hx   ° ° °Social History:  ° reports that she quit smoking about 26 years ago. Her smoking use included cigarettes. She has never used smokeless  tobacco. She reports that she does not currently use alcohol. She reports that she does not use drugs. ° °Physical Exam: °BP (!) 145/83    Pulse (!) 111    Ht 5' 4" (1.626 m)    Wt 186 lb (84.4 kg)    LMP 12/01/2020 (Approximate)    BMI 31.93 kg/m²   °Constitutional:  Alert and oriented, no acute distress, nontoxic appearing °HEENT: [place], AT °Cardiovascular: No clubbing, cyanosis, or edema °Respiratory: Normal respiratory effort, no increased work of breathing °Skin: No rashes, bruises or suspicious lesions °Neurologic: Grossly intact, no focal deficits, moving all 4 extremities °Psychiatric: Normal mood and affect ° °Laboratory Data: °Results for orders placed or performed in visit on 07/22/21  °CULTURE, URINE COMPREHENSIVE  ° Specimen: Urine  ° UR  °Result Value Ref   Range  ° Urine Culture, Comprehensive Final report (A)   ° Organism ID, Bacteria Proteus mirabilis (A)   ° Organism ID, Bacteria Comment   ° ANTIMICROBIAL SUSCEPTIBILITY Comment   °Microscopic Examination  ° Urine  °Result Value Ref Range  ° WBC, UA 0-5 0 - 5 /hpf  ° RBC None seen 0 - 2 /hpf  ° Epithelial Cells (non renal) 0-10 0 - 10 /hpf  ° Bacteria, UA None seen None seen/Few  °Urinalysis, Complete  °Result Value Ref Range  ° Specific Gravity, UA 1.015 1.005 - 1.030  ° pH, UA 5.5 5.0 - 7.5  ° Color, UA Yellow Yellow  ° Appearance Ur Clear Clear  ° Leukocytes,UA Negative Negative  ° Protein,UA Negative Negative/Trace  ° Glucose, UA 3+ (A) Negative  ° Ketones, UA Negative Negative  ° RBC, UA Negative Negative  ° Bilirubin, UA Negative Negative  ° Urobilinogen, Ur 0.2 0.2 - 1.0 mg/dL  ° Nitrite, UA Negative Negative  ° Microscopic Examination See below:   ° °Assessment & Plan:   °1. Dysuria °UA today is benign, though will send for culture out of an abundance of caution.  Patient was reassured.  We discussed continuing her urinary agents for management of her stent discomfort.  Patient declined refills today. °- Urinalysis, Complete °- CULTURE, URINE  COMPREHENSIVE ° °Return if symptoms worsen or fail to improve. ° °Mandi Mattioli, PA-C ° °[facility] °[address], [address] °[place], [place] [address] °[phone] °   °

## 2021-07-22 NOTE — Progress Notes (Signed)
07/22/2021 1:58 PM   Denise Burns 26-Sep-1970 [id number]  CC: Chief Complaint  Patient presents with   Dysuria   HPI: Denise Burns is a 50 y.o. female with PMH nephrolithiasis who currently has a right ureteral stent in place after experiencing a right proximal ureteral perforation during right ureteroscopy last month who presents today for evaluation of possible UTI.   She has been struggling with stent discomfort since her recent ureteroscopy and is currently taking Uribel, oxycodone, Gemtesa, and tamsulosin.  She was also recently treated for Proteus UTI with Keflex.  Today she reports some cloudy urine and dysuria with concerns for possible recurrent versus persistent UTI.  In-office UA today positive for 3+ glucose; urine microscopy pan negative.  PMH: Past Medical History:  Diagnosis Date   Addison disease ([place])    Arthritis    Back pain    Complication of anesthesia    nausea   GERD (gastroesophageal reflux disease)     Surgical History: Past Surgical History:  Procedure Laterality Date   CHOLECYSTECTOMY  2010   CYSTOSCOPY/URETEROSCOPY/HOLMIUM LASER/STENT PLACEMENT Right 06/28/2021   Procedure: CYSTOSCOPY/URETEROSCOPY/STENT PLACEMENT;  Surgeon: Hollice Espy, MD;  Location: [facility] ORS;  Service: Urology;  Laterality: Right;   dental work     DIAGNOSTIC LAPAROSCOPY  2004   endometriosis dx   DILATION AND CURETTAGE OF UTERUS  2010   IR NEPHROSTOMY PLACEMENT LEFT  12/14/2020   NEPHROLITHOTOMY Left 12/14/2020   Procedure: NEPHROLITHOTOMY PERCUTANEOUS;  Surgeon: Hollice Espy, MD;  Location: [facility] ORS;  Service: Urology;  Laterality: Left;   POLYPECTOMY     WISDOM TOOTH EXTRACTION      Home Medications:  Allergies as of 07/22/2021       Reactions   Lisinopril Cough   Other reaction(s): rash   Sulfacetamide Sodium (acne)    Other reaction(s): sores   Tramadol Other (See Comments)   Other reaction(s): muscle tension Other reaction(s): muscle  tension   Sulfa Antibiotics Other (See Comments)   Blisters in mouth         Medication List        Accurate as of July 22, 2021  1:58 PM. If you have any questions, ask your nurse or doctor.          STOP taking these medications    cephALEXin 500 MG capsule Commonly known as: Keflex Stopped by: Debroah Loop, PA-C       TAKE these medications    Azelastine HCl 137 MCG/SPRAY Soln Place 1 spray into both nostrils 2 (two) times daily.   chlorpheniramine-HYDROcodone 10-8 MG/5ML Suer Commonly known as: Tussionex Pennkinetic ER Take 5 mLs by mouth every 12 (twelve) hours as needed for cough.   diazepam 5 MG tablet Commonly known as: Valium Take one tablet by mouth 60 minutes before scheduled procedure. Wait 30 minutes. If still anxious, take the second tablet 30 minutes before procedure. Must have a driver. (Take 1-2 tablets (5-10 mg total) by mouth 60 (sixty) minutes before procedure for 1 dose. Take one (1) tab (5 mg) 60 minutes before scheduled procedure. Wait 30 minutes. If still anxious, take the second (5 mg) tab 30 min before procedure.)   Flonase Sensimist 27.5 MCG/SPRAY nasal spray Generic drug: fluticasone Place 1 spray into the nose in the morning and at bedtime.   fludrocortisone 0.1 MG tablet Commonly known as: FLORINEF TAKE 1/2 TABLET BY MOUTH DAILY   HYDROcodone-acetaminophen 7.5-325 MG tablet Commonly known as: NORCO Take 1 tablet by mouth every  eight) hours as needed for severe pain. Must last 30 days. °Start taking on: August 04, 2021 °  °hydrocortisone 10 MG tablet °Commonly known as: CORTEF °Take 1 and 1/2 tablets by mouth daily with breakfast and 1/2 tablet daily in the evening °  °meloxicam 15 MG tablet °Commonly known as: MOBIC °TAKE 1 TABLET BY MOUTH DAILY °  °oxybutynin 5 MG tablet °Commonly known as: DITROPAN °Take 1 tablet (5 mg total) by mouth every 8 (eight) hours as needed for bladder spasms. °  °oxyCODONE 5 MG immediate  release tablet °Commonly known as: Roxicodone °Take 1 tablet (5 mg total) by mouth every 4 (four) hours as needed for severe pain. °  °phenazopyridine 100 MG tablet °Commonly known as: PYRIDIUM °Take 2 tablets (200 mg total) by mouth 3 (three) times daily as needed for pain. °  °Plenvu 140 g Solr °Generic drug: PEG-KCl-NaCl-NaSulf-Na Asc-C °Take as directed for colonoscopy prep °(Take 1 kit by mouth as directed. Manufacturer's coupon Universal coupon code:BIN: 019158; GROUP: [id number]; PCN: [id number]; ID: [id number]; PAY NO MORE $50; NO prior authorization) °  °pseudoephedrine 30 MG tablet °Commonly known as: SUDAFED °Take 30 mg by mouth every 12 (twelve) hours as needed for congestion. °  °tamsulosin 0.4 MG Caps capsule °Commonly known as: Flomax °Take 1 capsule (0.4 mg total) by mouth daily. °  °tiZANidine 4 MG tablet °Commonly known as: ZANAFLEX °Take 1 tablet (4 mg total) by mouth every 8 (eight) hours as needed for muscle spasms. °  °Urogesic-Blue 81.6 MG Tabs °Take 1 tablet (81.6 mg total) by mouth 4 (four) times daily as needed. °  ° °  ° ° °Allergies:  °Allergies  °Allergen Reactions  ° Lisinopril Cough  °  Other reaction(s): rash  ° Sulfacetamide Sodium (Acne)   °  Other reaction(s): sores  ° Tramadol Other (See Comments)  °  Other reaction(s): muscle tension °Other reaction(s): muscle tension  ° Sulfa Antibiotics Other (See Comments)  °  Blisters in mouth   ° ° °Family History: °Family History  °Problem Relation Age of Onset  ° Arthritis Mother   ° Hyperlipidemia Mother   ° Hypertension Mother   ° High blood pressure Mother   ° Alcohol abuse Father   ° Hearing loss Father   ° Hyperlipidemia Father   ° High blood pressure Father   ° Colon polyps Maternal Grandmother 80  ° Colon cancer Neg Hx   ° Esophageal cancer Neg Hx   ° Stomach cancer Neg Hx   ° Rectal cancer Neg Hx   ° ° °Social History:  ° reports that she quit smoking about 26 years ago. Her smoking use included cigarettes. She has never used smokeless  tobacco. She reports that she does not currently use alcohol. She reports that she does not use drugs. ° °Physical Exam: °BP (!) 145/83    Pulse (!) 111    Ht 5' 4" (1.626 m)    Wt 186 lb (84.4 kg)    LMP 12/01/2020 (Approximate)    BMI 31.93 kg/m²   °Constitutional:  Alert and oriented, no acute distress, nontoxic appearing °HEENT: [place], AT °Cardiovascular: No clubbing, cyanosis, or edema °Respiratory: Normal respiratory effort, no increased work of breathing °Skin: No rashes, bruises or suspicious lesions °Neurologic: Grossly intact, no focal deficits, moving all 4 extremities °Psychiatric: Normal mood and affect ° °Laboratory Data: °Results for orders placed or performed in visit on 07/22/21  °CULTURE, URINE COMPREHENSIVE  ° Specimen: Urine  ° UR  °Result Value Ref   Ref Range   Urine Culture, Comprehensive Final report (A)    Organism ID, Bacteria Proteus mirabilis (A)    Organism ID, Bacteria Comment    ANTIMICROBIAL SUSCEPTIBILITY Comment   Microscopic Examination   Urine  Result Value Ref Range   WBC, UA 0-5 0 - 5 /hpf   RBC None seen 0 - 2 /hpf   Epithelial Cells (non renal) 0-10 0 - 10 /hpf   Bacteria, UA None seen None seen/Few  Urinalysis, Complete  Result Value Ref Range   Specific Gravity, UA 1.015 1.005 - 1.030   pH, UA 5.5 5.0 - 7.5   Color, UA Yellow Yellow   Appearance Ur Clear Clear   Leukocytes,UA Negative Negative   Protein,UA Negative Negative/Trace   Glucose, UA 3+ (A) Negative   Ketones, UA Negative Negative   RBC, UA Negative Negative   Bilirubin, UA Negative Negative   Urobilinogen, Ur 0.2 0.2 - 1.0 mg/dL   Nitrite, UA Negative Negative   Microscopic Examination See below:    Assessment & Plan:   1. Dysuria UA today is benign, though will send for culture out of an abundance of caution.  Patient was reassured.  We discussed continuing her urinary agents for management of her stent discomfort.  Patient declined refills today. - Urinalysis, Complete - CULTURE, URINE  COMPREHENSIVE  Return if symptoms worsen or fail to improve.  Debroah Loop, PA-C  [facility] [address], [place], [place] [address] [phone]

## 2021-07-27 ENCOUNTER — Other Ambulatory Visit: Payer: Self-pay | Admitting: Physician Assistant

## 2021-07-27 DIAGNOSIS — N9971 Accidental puncture and laceration of a genitourinary system organ or structure during a genitourinary system procedure: Secondary | ICD-10-CM

## 2021-07-27 DIAGNOSIS — N200 Calculus of kidney: Secondary | ICD-10-CM

## 2021-07-27 LAB — MICROSCOPIC EXAMINATION
Bacteria, UA: NONE SEEN
RBC, Urine: NONE SEEN /hpf (ref 0–2)

## 2021-07-27 LAB — URINALYSIS, COMPLETE
Bilirubin, UA: NEGATIVE
Ketones, UA: NEGATIVE
Leukocytes,UA: NEGATIVE
Nitrite, UA: NEGATIVE
Protein,UA: NEGATIVE
RBC, UA: NEGATIVE
Specific Gravity, UA: 1.015 (ref 1.005–1.030)
Urobilinogen, Ur: 0.2 mg/dL (ref 0.2–1.0)
pH, UA: 5.5 (ref 5.0–7.5)

## 2021-07-27 NOTE — Progress Notes (Signed)
Surgical Physician [facility]  *Dr. Erlene Quan Scheduling expectation :  early-mid January  *Length of Case:   *Clearance needed: no  *Anticoagulation Instructions: N/A  *Aspirin Instructions: N/A  *Post-op visit Date/Instructions:  1 month with RUS prior  *Diagnosis:  Right nephrolithasis, right ureteral perforation  *Procedure: Cystoscopy and right ureteral stent removal with right retrograde pyelogram, possible right ureteroscopy with laser lithotripsy and stent placement  Additional orders: N/A  -Admit type: OUTpatient  -Anesthesia: General  -VTE Prophylaxis Standing Order SCDs       Other:   -Standing Lab Orders Per Anesthesia    Lab other: UA&Urine Culture  -Standing Test orders EKG/Chest x-ray per Anesthesia       Test other:   - Medications:  Ancef 2gm IV  -Other orders:  N/A

## 2021-07-28 ENCOUNTER — Encounter: Payer: BC Managed Care – PPO | Admitting: Pain Medicine

## 2021-07-28 ENCOUNTER — Telehealth: Payer: Self-pay

## 2021-07-28 ENCOUNTER — Other Ambulatory Visit: Payer: Self-pay

## 2021-07-28 DIAGNOSIS — N200 Calculus of kidney: Secondary | ICD-10-CM

## 2021-07-28 LAB — CULTURE, URINE COMPREHENSIVE

## 2021-07-28 MED ORDER — CIPROFLOXACIN HCL 250 MG PO TABS
250.0000 mg | ORAL_TABLET | Freq: Two times a day (BID) | ORAL | 0 refills | Status: AC
  Filled 2021-07-28: qty 10, 5d supply, fill #0

## 2021-07-28 NOTE — Progress Notes (Signed)
[facility] Posting Form   Surgery Date/Time: Date: 08/09/2021  Surgeon: Dr. Hollice Espy, MD  Surgery Location: Day Surgery  Inpt ( No  )   Outpt (Yes)   Obs ( No  )   Diagnosis: N20.0 Right Nephrolithiasis, N99.71 Right Ureteral Perforation   -CPT: [id number] poss [id number]  Surgery: Cystoscopy with right ureteral stent removal and right retrograde pyelogram; Possible right ureteroscopy with laser lithotripsy and stent placement  Stop Anticoagulations: N/A  Cardiac/Medical/Pulmonary Clearance needed: no  *Orders entered into EPIC  Date: 07/28/21   *Case booked in [place]  Date: 07/28/21  *Notified pt of Surgery: Date: 07/28/21  PRE-OP UA & CX: Yes, obtained on 07/22/2021  *Placed into Prior Authorization Work Fabio Bering Date: 07/28/21   Assistant/laser/rep:No

## 2021-07-28 NOTE — Telephone Encounter (Signed)
-----   Message from Debroah Loop, [place] sent at 07/28/2021  1:46 PM EST ----- Can you please call and check on her symptoms? If symptomatic of UTI, ok to start cefuroxime 250mg  BID x10 days. If not, recommend Cipro 250mg  BID x5 days leading up to her upcoming surgery to sterilize the urine. ----- Message ----- From: Interface, [facility] Lab Results In Sent: 07/24/2021   7:36 AM EST To: Debroah Loop, PA-C

## 2021-07-28 NOTE — Telephone Encounter (Signed)
Patient reports no more bothersome urinary symptoms beyond what she normally experiences. Cipro 250mg  sent to pt's pharmacy. Pt expressed understanding.

## 2021-08-04 ENCOUNTER — Other Ambulatory Visit: Payer: Self-pay

## 2021-08-05 ENCOUNTER — Other Ambulatory Visit: Payer: Self-pay

## 2021-08-05 ENCOUNTER — Other Ambulatory Visit
Admission: RE | Admit: 2021-08-05 | Discharge: 2021-08-05 | Disposition: A | Discharge status: Home / Self Care | Payer: BC Managed Care – PPO | Source: Ambulatory Visit | Attending: Urology | Admitting: Urology

## 2021-08-05 NOTE — Patient Instructions (Signed)
Your procedure is scheduled on: Monday August 09, 2021. Report to Day Surgery inside [facility] 2nd floor. To find out your arrival time please call [phone] between 1PM - 3PM on Friday August 06, 2021.  Remember: Instructions that are not followed completely may result in serious medical risk,  up to and including death, or upon the discretion of your surgeon and anesthesiologist your  surgery may need to be rescheduled.     _X__ 1. Do not eat food or drink fluids after midnight the night before your procedure.                 No chewing gum or hard candies.   __X__2.  On the morning of surgery brush your teeth with toothpaste and water, you                may rinse your mouth with mouthwash if you wish.  Do not swallow any toothpaste or mouthwash.     _X__ 3.  No Alcohol for 24 hours before or after surgery.   _X__ 4.  Do Not Smoke or use e-cigarettes For 24 Hours Prior to Your Surgery.                 Do not use any chewable tobacco products for at least 6 hours prior to                 Surgery.  _X__  5.  Do not use any recreational drugs (marijuana, cocaine, heroin, ecstasy, MDMA or other)                For at least one week prior to your surgery.  Combination of these drugs with anesthesia                May have life threatening results.  __X__6.  Notify your doctor if there is any change in your medical condition      (cold, fever, infections).     Do not wear jewelry, make-up, hairpins, clips or nail polish. Do not wear lotions, powders, or perfumes. You may wear deodorant. Do not shave 48 hours prior to surgery.  Do not bring valuables to the hospital.    [facility] is not responsible for any belongings or valuables.  Contacts, dentures or bridgework may not be worn into surgery. Leave your suitcase in the car. After surgery it may be brought to your room. For patients admitted to the hospital, discharge time is determined by  your treatment team.   Patients discharged the day of surgery will not be allowed to drive home.   Make arrangements for someone to be with you for the first 24 hours of your Same Day Discharge.   __X__ Take these medicines the morning of surgery with A SIP OF WATER:    1. fludrocortisone (FLORINEF) 0.1 MG  2. hydrocortisone (CORTEF) 10 MG  3.   4.  5.  6.  ____ Fleet Enema (as directed)   ____ Use CHG Soap (or wipes) as directed  ____ Use Benzoyl Peroxide Gel as instructed  ____ Use inhalers on the day of surgery  ____ Stop metformin 2 days prior to surgery    ____ Take 1/2 of usual insulin dose the night before surgery. No insulin the morning          of surgery.   ____ Call your PCP, cardiologist, or Pulmonologist if taking Coumadin/Plavix/aspirin and ask when to stop before your surgery.   __X__  One Week prior to surgery- Stop Anti-inflammatories such as Ibuprofen, Aleve, Advil, Motrin, meloxicam (MOBIC), diclofenac, etodolac, ketorolac, Toradol, Daypro, piroxicam, Goody's or BC powders. OK TO USE TYLENOL IF NEEDED   __X__ Stop supplements until after surgery.    ____ Bring C-Pap to the hospital.    If you have any questions regarding your pre-procedure instructions,  Please call Pre-admit Testing at [phone]

## 2021-08-09 ENCOUNTER — Ambulatory Visit: Payer: BC Managed Care – PPO

## 2021-08-09 ENCOUNTER — Ambulatory Visit: Payer: BC Managed Care – PPO | Admitting: Certified Registered"

## 2021-08-09 ENCOUNTER — Other Ambulatory Visit: Payer: Self-pay

## 2021-08-09 ENCOUNTER — Ambulatory Visit
Admission: RE | Admit: 2021-08-09 | Discharge: 2021-08-09 | Disposition: A | Discharge status: Home / Self Care | Payer: BC Managed Care – PPO | Attending: Urology | Admitting: Urology

## 2021-08-09 ENCOUNTER — Encounter
Admission: RE | Disposition: A | Discharge status: Home / Self Care | Payer: Self-pay | Source: Home / Self Care | Attending: Urology

## 2021-08-09 ENCOUNTER — Encounter: Payer: Self-pay | Admitting: Urology

## 2021-08-09 DIAGNOSIS — E271 Primary adrenocortical insufficiency: Secondary | ICD-10-CM | POA: Diagnosis not present

## 2021-08-09 DIAGNOSIS — M545 Low back pain, unspecified: Secondary | ICD-10-CM

## 2021-08-09 DIAGNOSIS — Z87891 Personal history of nicotine dependence: Secondary | ICD-10-CM | POA: Diagnosis not present

## 2021-08-09 DIAGNOSIS — M533 Sacrococcygeal disorders, not elsewhere classified: Secondary | ICD-10-CM

## 2021-08-09 DIAGNOSIS — G8929 Other chronic pain: Secondary | ICD-10-CM

## 2021-08-09 DIAGNOSIS — Z79891 Long term (current) use of opiate analgesic: Secondary | ICD-10-CM

## 2021-08-09 DIAGNOSIS — Z9049 Acquired absence of other specified parts of digestive tract: Secondary | ICD-10-CM | POA: Insufficient documentation

## 2021-08-09 DIAGNOSIS — K219 Gastro-esophageal reflux disease without esophagitis: Secondary | ICD-10-CM | POA: Insufficient documentation

## 2021-08-09 DIAGNOSIS — G894 Chronic pain syndrome: Secondary | ICD-10-CM

## 2021-08-09 DIAGNOSIS — Z79899 Other long term (current) drug therapy: Secondary | ICD-10-CM

## 2021-08-09 DIAGNOSIS — N200 Calculus of kidney: Secondary | ICD-10-CM | POA: Insufficient documentation

## 2021-08-09 DIAGNOSIS — M47816 Spondylosis without myelopathy or radiculopathy, lumbar region: Secondary | ICD-10-CM

## 2021-08-09 DIAGNOSIS — Z8742 Personal history of other diseases of the female genital tract: Secondary | ICD-10-CM

## 2021-08-09 DIAGNOSIS — M25551 Pain in right hip: Secondary | ICD-10-CM

## 2021-08-09 DIAGNOSIS — N9971 Accidental puncture and laceration of a genitourinary system organ or structure during a genitourinary system procedure: Secondary | ICD-10-CM

## 2021-08-09 HISTORY — PX: CYSTOSCOPY W/ RETROGRADES: SHX1426

## 2021-08-09 HISTORY — PX: CYSTOSCOPY W/ URETERAL STENT REMOVAL: SHX1430

## 2021-08-09 HISTORY — PX: CYSTOSCOPY/URETEROSCOPY/HOLMIUM LASER/STENT PLACEMENT: SHX6546

## 2021-08-09 SURGERY — REMOVAL, STENT, URETER, CYSTOSCOPIC
Anesthesia: General | Laterality: Right

## 2021-08-09 MED ORDER — KETOROLAC TROMETHAMINE 15 MG/ML IJ SOLN
INTRAMUSCULAR | Status: DC | PRN
  Administered 2021-08-09: 15 mg via INTRAVENOUS

## 2021-08-09 MED ORDER — ONDANSETRON HCL 4 MG/2ML IJ SOLN
INTRAMUSCULAR | Status: DC | PRN
  Administered 2021-08-09: 4 mg via INTRAVENOUS

## 2021-08-09 MED ORDER — FENTANYL CITRATE (PF) 100 MCG/2ML IJ SOLN
25.0000 ug | INTRAMUSCULAR | Status: DC | PRN
  Administered 2021-08-09 (×2): 25 ug via INTRAVENOUS

## 2021-08-09 MED ORDER — FENTANYL CITRATE (PF) 100 MCG/2ML IJ SOLN
INTRAMUSCULAR | Status: AC
  Administered 2021-08-09: 50 ug via INTRAVENOUS
  Filled 2021-08-09: qty 2

## 2021-08-09 MED ORDER — LIDOCAINE HCL (CARDIAC) PF 100 MG/5ML IV SOSY
PREFILLED_SYRINGE | INTRAVENOUS | Status: DC | PRN
  Administered 2021-08-09: 60 mg via INTRAVENOUS

## 2021-08-09 MED ORDER — HYDROMORPHONE HCL 1 MG/ML IJ SOLN
INTRAMUSCULAR | Status: AC
  Filled 2021-08-09: qty 1

## 2021-08-09 MED ORDER — PROPOFOL 10 MG/ML IV BOLUS
INTRAVENOUS | Status: AC
  Filled 2021-08-09: qty 20

## 2021-08-09 MED ORDER — HYDROMORPHONE HCL 1 MG/ML IJ SOLN
INTRAMUSCULAR | Status: DC | PRN
Start: 2021-08-09 — End: 2021-08-09
  Administered 2021-08-09 (×2): .5 mg via INTRAVENOUS

## 2021-08-09 MED ORDER — FAMOTIDINE 20 MG PO TABS: 20.0000 mg | ORAL_TABLET | Freq: Once | ORAL | Status: AC

## 2021-08-09 MED ORDER — CHLORHEXIDINE GLUCONATE 0.12 % MT SOLN
OROMUCOSAL | Status: AC
  Administered 2021-08-09: 15 mL via OROMUCOSAL
  Filled 2021-08-09: qty 15

## 2021-08-09 MED ORDER — ORAL CARE MOUTH RINSE: 15.0000 mL | Freq: Once | OROMUCOSAL | Status: AC

## 2021-08-09 MED ORDER — KETOROLAC TROMETHAMINE 30 MG/ML IJ SOLN
INTRAMUSCULAR | Status: DC | PRN
  Administered 2021-08-09: 15 mg via INTRAVENOUS

## 2021-08-09 MED ORDER — KETOROLAC TROMETHAMINE 30 MG/ML IJ SOLN
INTRAMUSCULAR | Status: AC
  Filled 2021-08-09: qty 1

## 2021-08-09 MED ORDER — MIDAZOLAM HCL 2 MG/2ML IJ SOLN
INTRAMUSCULAR | Status: DC | PRN
  Administered 2021-08-09: 2 mg via INTRAVENOUS

## 2021-08-09 MED ORDER — CEFAZOLIN SODIUM-DEXTROSE 2-4 GM/100ML-% IV SOLN
2.0000 g | INTRAVENOUS | Status: AC
  Administered 2021-08-09: 2 g via INTRAVENOUS

## 2021-08-09 MED ORDER — PROPOFOL 10 MG/ML IV BOLUS
INTRAVENOUS | Status: DC | PRN
  Administered 2021-08-09: 180 mg via INTRAVENOUS

## 2021-08-09 MED ORDER — OXYCODONE HCL 5 MG PO TABS
5.0000 mg | ORAL_TABLET | ORAL | 0 refills | Status: DC | PRN
  Filled 2021-08-09: qty 10, 2d supply, fill #0

## 2021-08-09 MED ORDER — ONDANSETRON HCL 4 MG/2ML IJ SOLN: 4.0000 mg | Freq: Once | INTRAMUSCULAR | Status: DC | PRN

## 2021-08-09 MED ORDER — TAMSULOSIN HCL 0.4 MG PO CAPS
0.4000 mg | ORAL_CAPSULE | Freq: Every day | ORAL | 0 refills | Status: DC
  Filled 2021-08-09: qty 10, 10d supply, fill #0

## 2021-08-09 MED ORDER — ROCURONIUM BROMIDE 100 MG/10ML IV SOLN
INTRAVENOUS | Status: DC | PRN
  Administered 2021-08-09: 50 mg via INTRAVENOUS

## 2021-08-09 MED ORDER — SUGAMMADEX SODIUM 200 MG/2ML IV SOLN
INTRAVENOUS | Status: DC | PRN
  Administered 2021-08-09: 200 mg via INTRAVENOUS

## 2021-08-09 MED ORDER — LACTATED RINGERS IV SOLN: INTRAVENOUS | Status: DC

## 2021-08-09 MED ORDER — FAMOTIDINE 20 MG PO TABS
ORAL_TABLET | ORAL | Status: AC
  Administered 2021-08-09: 20 mg via ORAL
  Filled 2021-08-09: qty 1

## 2021-08-09 MED ORDER — CHLORHEXIDINE GLUCONATE 0.12 % MT SOLN: 15.0000 mL | Freq: Once | OROMUCOSAL | Status: AC

## 2021-08-09 MED ORDER — DEXAMETHASONE SODIUM PHOSPHATE 10 MG/ML IJ SOLN
INTRAMUSCULAR | Status: DC | PRN
  Administered 2021-08-09: 10 mg via INTRAVENOUS

## 2021-08-09 MED ORDER — MIDAZOLAM HCL 2 MG/2ML IJ SOLN
INTRAMUSCULAR | Status: AC
  Filled 2021-08-09: qty 2

## 2021-08-09 MED ORDER — ACETAMINOPHEN 10 MG/ML IV SOLN: 1000.0000 mg | Freq: Once | INTRAVENOUS | Status: DC | PRN

## 2021-08-09 MED ORDER — CEFAZOLIN SODIUM-DEXTROSE 2-4 GM/100ML-% IV SOLN
INTRAVENOUS | Status: AC
  Filled 2021-08-09: qty 100

## 2021-08-09 MED ORDER — IOHEXOL 180 MG/ML  SOLN
INTRAMUSCULAR | Status: DC | PRN
  Administered 2021-08-09: 20 mL

## 2021-08-09 SURGICAL SUPPLY — 33 items
ADH LQ OCL WTPRF AMP STRL LF (MISCELLANEOUS)
ADHESIVE MASTISOL STRL (MISCELLANEOUS) IMPLANT
BAG DRAIN CYSTO-URO LG1000N (MISCELLANEOUS) ×2 IMPLANT
BASKET ZERO TIP 1.9FR (BASKET) ×1 IMPLANT
BRUSH SCRUB EZ 1% IODOPHOR (MISCELLANEOUS) ×2 IMPLANT
BSKT STON RTRVL ZERO TP 1.9FR (BASKET) ×1
CATH URET FLEX-TIP 2 LUMEN 10F (CATHETERS) IMPLANT
CATH URETL OPEN 5X70 (CATHETERS) ×2 IMPLANT
CNTNR SPEC 2.5X3XGRAD LEK (MISCELLANEOUS)
CONT SPEC 4OZ STER OR WHT (MISCELLANEOUS)
CONT SPEC 4OZ STRL OR WHT (MISCELLANEOUS)
CONTAINER SPEC 2.5X3XGRAD LEK (MISCELLANEOUS) IMPLANT
DRAPE UTILITY 15X26 TOWEL STRL (DRAPES) ×2 IMPLANT
DRSG TEGADERM 2-3/8X2-3/4 SM (GAUZE/BANDAGES/DRESSINGS) IMPLANT
GAUZE 4X4 16PLY ~~LOC~~+RFID DBL (SPONGE) ×4 IMPLANT
GLOVE SURG ENC MOIS LTX SZ6.5 (GLOVE) ×2 IMPLANT
GOWN STRL REUS W/ TWL LRG LVL3 (GOWN DISPOSABLE) ×2 IMPLANT
GOWN STRL REUS W/TWL LRG LVL3 (GOWN DISPOSABLE) ×4
GUIDEWIRE GREEN .038 145CM (MISCELLANEOUS) ×1 IMPLANT
GUIDEWIRE STR DUAL SENSOR (WIRE) ×2 IMPLANT
INFUSOR MANOMETER BAG 3000ML (MISCELLANEOUS) ×2 IMPLANT
IV NS IRRIG 3000ML ARTHROMATIC (IV SOLUTION) ×2 IMPLANT
KIT TURNOVER CYSTO (KITS) ×2 IMPLANT
MANIFOLD NEPTUNE II (INSTRUMENTS) ×2 IMPLANT
PACK CYSTO AR (MISCELLANEOUS) ×2 IMPLANT
SET CYSTO W/LG BORE CLAMP LF (SET/KITS/TRAYS/PACK) ×2 IMPLANT
SHEATH URETERAL 12FRX35CM (MISCELLANEOUS) IMPLANT
STENT URET 6FRX24 CONTOUR (STENTS) IMPLANT
STENT URET 6FRX26 CONTOUR (STENTS) IMPLANT
SURGILUBE 2OZ TUBE FLIPTOP (MISCELLANEOUS) ×2 IMPLANT
TRACTIP FLEXIVA PULSE ID 200 (Laser) ×2 IMPLANT
WATER STERILE IRR 1000ML POUR (IV SOLUTION) ×2 IMPLANT
WATER STERILE IRR 500ML POUR (IV SOLUTION) ×2 IMPLANT

## 2021-08-09 NOTE — Interval H&P Note (Signed)
History and Physical Interval Note:  08/09/2021 1:27 PM  Denise Burns  has presented today for surgery, with the diagnosis of RIGHT NEPHROLITHIASIS.  The various methods of treatment have been discussed with the patient and family. After consideration of risks, benefits and other options for treatment, the patient has consented to  Procedure(s): CYSTOSCOPY WITH STENT REMOVAL (Right) CYSTOSCOPY WITH RETROGRADE PYELOGRAM (Right) CYSTOSCOPY/URETEROSCOPY/HOLMIUM LASER/STENT PLACEMENT (Right) as a surgical intervention.  The patient's history has been reviewed, patient examined, no change in status, stable for surgery.  I have reviewed the patient's chart and labs.  Questions were answered to the patient's satisfaction.    RRR CTAB   Hollice Espy

## 2021-08-09 NOTE — Anesthesia Postprocedure Evaluation (Signed)
Anesthesia Post Note  Patient: Denise Burns  Procedure(s) Performed: CYSTOSCOPY WITH STENT REMOVAL (Right) CYSTOSCOPY WITH RETROGRADE PYELOGRAM (Right) CYSTOSCOPY/URETEROSCOPY/HOLMIUM LASER/STENT PLACEMENT (Right)  Patient location during evaluation: PACU Anesthesia Type: General Level of consciousness: awake and alert Pain management: pain level controlled Vital Signs Assessment: post-procedure vital signs reviewed and stable Respiratory status: spontaneous breathing, nonlabored ventilation, respiratory function stable and patient connected to nasal cannula oxygen Cardiovascular status: blood pressure returned to baseline and stable Postop Assessment: no apparent nausea or vomiting Anesthetic complications: no   No notable events documented.   Last Vitals:  Vitals:   08/09/21 1055 08/09/21 1456  BP: (!) 139/99 131/82  Pulse: 93 83  Resp: 20 (!) 26  Temp: 36.6 C (!) 36 C  SpO2: 99% 99%    Last Pain:  Vitals:   08/09/21 1456  TempSrc:   PainSc: 0-No pain                 Arita Miss

## 2021-08-09 NOTE — Transfer of Care (Addendum)
Immediate Anesthesia Transfer of Care Note  Patient: Denise Burns  Procedure(s) Performed: CYSTOSCOPY WITH STENT REMOVAL (Right) CYSTOSCOPY WITH RETROGRADE PYELOGRAM (Right) CYSTOSCOPY/URETEROSCOPY/HOLMIUM LASER/STENT PLACEMENT (Right)  Patient Location: PACU  Anesthesia Type:General  Level of Consciousness: drowsy  Airway & Oxygen Therapy: Patient Spontanous Breathing  Post-op Assessment: Report given to RN  Post vital signs: Reviewed stable  Last Vitals:  Vitals Value Taken Time  BP    Temp    Pulse    Resp    SpO2      Last Pain:  Vitals:   08/09/21 1055  TempSrc: Oral  PainSc: 0-No pain         Complications: No notable events documented.

## 2021-08-09 NOTE — Anesthesia Procedure Notes (Addendum)
Procedure Name: Intubation Date/Time: 08/09/2021 1:56 PM Performed by: Kerri Perches, CRNA Pre-anesthesia Checklist: Patient identified, Patient being monitored, Timeout performed, Emergency Drugs available and Suction available Patient Re-evaluated:Patient Re-evaluated prior to induction Oxygen Delivery Method: Circle system utilized Preoxygenation: Pre-oxygenation with 100% oxygen Induction Type: IV induction Ventilation: Mask ventilation without difficulty Laryngoscope Size: McGraph and 4 Grade View: Grade II Tube type: Oral Tube size: 6.5 mm Number of attempts: 2 Airway Equipment and Method: Stylet Placement Confirmation: ETT inserted through vocal cords under direct vision, positive ETCO2 and breath sounds checked- equal and bilateral Secured at: 21 cm Tube secured with: Tape Dental Injury: Bloody posterior oropharynx  Comments: Some bleeding in posterior oropharynx/arytenoids after intubation.

## 2021-08-09 NOTE — Op Note (Signed)
Date of procedure: 08/09/21  Preoperative diagnosis:  Right kidney stone History of ureteral perforation  Postoperative diagnosis:  Same as above  Procedure: Right ureteroscopy with laser lithotripsy Basket extraction of stone fragment Right ureteral stent exchange Right retrograde pyelogram Interpretation of fluoroscopy less than 30 minutes  Surgeon: Hollice Espy, MD  Anesthesia: General  Complications: None  Intraoperative findings: Nonobstructing right lower pole stone obliterated.  Slight narrowing at right UPJ but easily traversable.  No further extravasation and appear to be well-healed.  Stent exchanged.  Moderate amount of stent encrustation on previous indwelling stent.  EBL: Minimal  Specimens: None  Drains: 6 x 24 French double-J ureteral stent on right  Indication: Denise Burns is a 51 y.o. patient with nonobstructing symptomatic right lower pole stone.  Unfortunately, she had a ureteral perforation however returns today for staged procedure after allowing indwelling stent for about 6 weeks after the initial.  After reviewing the management options for treatment, she elected to proceed with the above surgical procedure(s). We have discussed the potential benefits and risks of the procedure, side effects of the proposed treatment, the likelihood of the patient achieving the goals of the procedure, and any potential problems that might occur during the procedure or recuperation. Informed consent has been obtained.  Description of procedure:  The patient was taken to the operating room and general anesthesia was induced.  The patient was placed in the dorsal lithotomy position, prepped and draped in the usual sterile fashion, and preoperative antibiotics were administered. A preoperative time-out was performed.   A 21 French cystoscope was advanced per urethra into the bladder.  On scout imaging, the stent could be seen in good position.  Notably, there was a  good amount of encrustation of the distal coil of the stent.  The stent was grasped using stent graspers and brought through the urethral meatus.  I ended up having to excise the distal coil with scissors due to the degree of encrustation in order to cannulate the stent with a sensor wire.  This went up easily to the level of the kidney.  I removed the stent without difficulty.  A dual lumen ureteral access sheath was advanced into the distal ureter and contrast was injected through the second port alongside the wire which revealed very subtle narrowing at the UPJ and some mild renal pelvic fullness but no overt hydronephrosis and no contrast extravasation.  I then advanced a Super Stiff wire up to the level of the kidney through the second port of the dual-lumen access sheath was removed.  I advanced a Lacinda Axon 12/14 French ureteral access sheath to the proximal ureter proximal to the UPJ which went easily.  The inner lumen was removed.  I ended up advancing the digital flexible 8 French dual-lumen ureteroscope over the Super Stiff wire into the renal pelvis which also went easily.  Notably, the level of the UPJ there is a mild amount of edema but otherwise no mucosal injuries appreciated and the ureter appeared entirely intact.  Within the renal pelvis because able to navigate into an extreme lower pole where the stone was encountered.  Initially brought in a 46 m laser fiber but had some difficulty angulating into the lower pole.  I used a 1.9 French tipless nitinol basket to maneuver the stone until I was able to fragment it slightly and then maneuver into an upper pole calyx remove the remainder of the stone was obliterated into dust like particles.  I then backed the  scope to the UPJ where a small fragment was encountered which was removed via the nitinol basket.  In the proximal ureter, additional contrast was injected which again showed no contrast extravasation and outline the upper tract collecting system.   I then removed the scope and the ureteral access sheath inspecting the ureter along way no additional injuries or fragments were appreciated.  Finally, a 6 x 24 French double-J ureteral stent was advanced over the wire up to the level of the kidney.  The wire was withdrawn and a coil was noted both within the renal pelvis as well as within the bladder.  The bladder was then drained.  The patient was then cleaned and dried, repositioned to supine position, reversed from anesthesia, and taken to the PACU in stable condition.  Plan: We will have her follow-up early next week for cystoscopy, stent removal.  She was given additional breakthrough narcotics as well as Flomax in interim.  She still has other medications help her tolerate the stent.   Hollice Espy, M.D.    Hollice Espy, M.D.

## 2021-08-09 NOTE — Discharge Instructions (Addendum)
You have a ureteral stent in place.  This is a tube that extends from your kidney to your bladder.  This may cause urinary bleeding, burning with urination, and urinary frequency.  Please call our office or present to the ED if you develop fevers >101 or pain which is not able to be controlled with oral pain medications.  You may be given either Flomax and/ or ditropan to help with bladder spasms and stent pain in addition to pain medications.    [facility] [address], [address] , [place] [address] [phone]  AMBULATORY SURGERY  DISCHARGE INSTRUCTIONS   The drugs that you were given will stay in your system until tomorrow so for the next 24 hours you should not:  Drive an automobile Make any legal decisions Drink any alcoholic beverage   You may resume regular meals tomorrow.  Today it is better to start with liquids and gradually work up to solid foods.  You may eat anything you prefer, but it is better to start with liquids, then soup and crackers, and gradually work up to solid foods.   Please notify your doctor immediately if you have any unusual bleeding, trouble breathing, redness and pain at the surgery site, drainage, fever, or pain not relieved by medication.    Additional Instructions:   Please contact your physician with any problems or Same Day Surgery at [phone], Monday through Friday 6 am to 4 pm, or [place] [facility] Main number at [phone].  

## 2021-08-09 NOTE — Anesthesia Preprocedure Evaluation (Signed)
Anesthesia Evaluation  Patient identified by MRN, date of birth, ID band Patient awake    Reviewed: Allergy & Precautions, H&P , NPO status , Patient's Chart, lab work & pertinent test results, reviewed documented beta blocker date and time   History of Anesthesia Complications (+) PONV and history of anesthetic complications  Airway Mallampati: II  TM Distance: >3 FB Neck ROM: full    Dental  (+) Teeth Intact   Pulmonary shortness of breath and with exertion, neg sleep apnea, neg COPD, Patient abstained from smoking.Not current smoker, former smoker,    Pulmonary exam normal        Cardiovascular Exercise Tolerance: Good METS(-) hypertension(-) CAD and (-) Past MI negative cardio ROS Normal cardiovascular exam(-) dysrhythmias  Rhythm:regular Rate:Normal     Neuro/Psych  Neuromuscular disease negative psych ROS   GI/Hepatic Neg liver ROS, GERD  Medicated,  Endo/Other  negative endocrine ROSneg diabetes  Renal/GU Renal disease  negative genitourinary   Musculoskeletal   Abdominal   Peds  Hematology  (+) Blood dyscrasia, anemia ,   Anesthesia Other Findings Past Medical History: No date: Addison disease ([place]) No date: Arthritis No date: Back pain No date: Complication of anesthesia     Comment:  nausea No date: GERD (gastroesophageal reflux disease) Past Surgical History: 2010: CHOLECYSTECTOMY No date: dental work 2004: DIAGNOSTIC LAPAROSCOPY     Comment:  endometriosis dx 2010: [place] OF UTERUS 12/14/2020: IR NEPHROSTOMY PLACEMENT LEFT 12/14/2020: NEPHROLITHOTOMY; Left     Comment:  Procedure: NEPHROLITHOTOMY PERCUTANEOUS;  Surgeon:               Hollice Espy, MD;  Location: [facility] ORS;  Service:               Urology;  Laterality: Left; No date: POLYPECTOMY No date: WISDOM TOOTH EXTRACTION   Reproductive/Obstetrics negative OB ROS                              Anesthesia Physical  Anesthesia Plan  ASA: 3  Anesthesia Plan: General   Post-op Pain Management: Ofirmev IV (intra-op)   Induction: Intravenous  PONV Risk Score and Plan: 4 or greater and Propofol infusion, TIVA and Ondansetron  Airway Management Planned: Natural Airway and LMA  Additional Equipment: None  Intra-op Plan:   Post-operative Plan:   Informed Consent: I have reviewed the patients History and Physical, chart, labs and discussed the procedure including the risks, benefits and alternatives for the proposed anesthesia with the patient or authorized representative who has indicated his/her understanding and acceptance.     Dental Advisory Given  Plan Discussed with: CRNA  Anesthesia Plan Comments: (Discussed risks of anesthesia with patient, including possibility of difficulty with spontaneous ventilation under anesthesia necessitating airway intervention, PONV, and rare risks such as cardiac or respiratory or neurological events, and allergic reactions. Discussed the role of CRNA in patient's perioperative care. Patient understands.)        Anesthesia Quick Evaluation

## 2021-08-10 ENCOUNTER — Encounter: Payer: Self-pay | Admitting: Urology

## 2021-08-10 ENCOUNTER — Telehealth: Payer: Self-pay | Admitting: Pain Medicine

## 2021-08-10 NOTE — Telephone Encounter (Signed)
Patient received additional pain medicine r/t kidney stone surgery.

## 2021-08-10 NOTE — Telephone Encounter (Signed)
This was r/t kidney stones.

## 2021-08-16 NOTE — Progress Notes (Signed)
° °  08/17/21  CC:  Chief Complaint  Patient presents with   Cysto Stent Removal   HPI: Denise Burns is a 51 y.o.female with a personal history of nephrolithiasis, who presents today for stent removal and cystoscopy.   He has a right ureteral stent in place after experiencing a right proximal ureteral perforation during right ureteroscopy   She underwent uncomplicated left PCNL on 12/15/2020.  She follows up today with renal ultrasound which shows no further stone burden in her left kidney without hydronephrosis.  Stone analysis on 12/14/2020 stone consistent with 60% struvite, 40% carbonate Apatate.  RUS on 01/20/2021 revealed 13 mm nonobstructive stone in the lower right kidney.The previously identified left staghorn calculus is not visualized.  On 1128/2022 she underwent right ureteroscopy for right renal stone. Intraoperative findings showed right proximal ureteral perforation, lateral with extravasation this procedure was aborted and stent was placed over safety wire.  She underwent right ureteroscopy with laser lithotripsy, basket extraction, right ureteral sten exchange, right retrograde pyelogram, on 08/09/2021. Intraoperative findings revealed nonobstructing  right lower pole stone obliterated. Slight narrowing at right UPJ but easily traversable.  No further extravasation and appear to be well-healed.  Stent exchanged.  Moderate amount of stent encrustation on previous indwelling stent.  She is anxious to have her stent removed today.      Vitals:   08/17/21 1508  BP: (!) 156/93  Pulse: (!) 112   NED. A&Ox3.   No respiratory distress   Abd soft, NT, ND Normal external genitalia with patent urethral meatus  Cystoscopy/ Stent removal procedure  Patient identification was confirmed, informed consent was obtained, and patient was prepped using Betadine solution.  Lidocaine jelly was administered per urethral meatus.    Preoperative abx where received prior to  procedure.    Procedure: - Flexible cystoscope introduced, without any difficulty.   - Thorough search of the bladder revealed:    normal urethral meatus  Stent seen emanating from right ureteral orifice, grasped with stent graspers, and removed in entirety.    Post-Procedure: - Patient tolerated the procedure well  Assessment and Plan:  1.Right kidney stone  - S/p ureteroscopy  - Stent removed today  - She was given 1 time dose of Keflex given today  -Warning symptoms reviewed   Return in 4-5 weeks   I,Denise Burns,acting as a scribe for Hollice Espy, MD.,have documented all relevant documentation on the behalf of Hollice Espy, MD,as directed by  Hollice Espy, MD while in the presence of Hollice Espy, MD.  I have reviewed the above documentation for accuracy and completeness, and I agree with the above.   Hollice Espy, MD

## 2021-08-17 ENCOUNTER — Other Ambulatory Visit: Payer: Self-pay

## 2021-08-17 ENCOUNTER — Ambulatory Visit (INDEPENDENT_AMBULATORY_CARE_PROVIDER_SITE_OTHER): Payer: BC Managed Care – PPO | Admitting: Urology

## 2021-08-17 VITALS — BP 156/93 | HR 112 | Ht 64.0 in | Wt 180.0 lb

## 2021-08-17 DIAGNOSIS — N200 Calculus of kidney: Secondary | ICD-10-CM

## 2021-08-17 LAB — MICROSCOPIC EXAMINATION: RBC, Urine: >30 /hpf — AB (ref 0–2)

## 2021-08-17 LAB — URINALYSIS, COMPLETE
Bilirubin, UA: NEGATIVE
Glucose, UA: NEGATIVE
Ketones, UA: NEGATIVE
Nitrite, UA: NEGATIVE
Specific Gravity, UA: 1.025 (ref 1.005–1.030)
Urobilinogen, Ur: 0.2 mg/dL (ref 0.2–1.0)
pH, UA: 6.5 (ref 5.0–7.5)

## 2021-08-17 MED ORDER — CEPHALEXIN 250 MG PO CAPS
500.0000 mg | ORAL_CAPSULE | Freq: Once | ORAL | Status: AC
  Administered 2021-08-17: 500 mg via ORAL

## 2021-08-18 ENCOUNTER — Ambulatory Visit: Payer: BC Managed Care – PPO | Admitting: Internal Medicine

## 2021-08-23 ENCOUNTER — Encounter: Payer: Self-pay | Admitting: Urology

## 2021-08-25 ENCOUNTER — Ambulatory Visit: Payer: BC Managed Care – PPO

## 2021-08-25 NOTE — Progress Notes (Signed)
Name: Denise Burns  MRN/ DOB: [id number], [birth year]    Age/ Sex: 51 y.o., female     PCP: [name], NP   Reason for Endocrinology Evaluation: Addison's Disease     Initial Endocrinology Clinic Visit: 08/06/2018    PATIENT IDENTIFIER: Denise Burns is a 51 y.o., female with a past medical history of Addison's Disease and premature ovarian  failure . She has followed with [facility] since 08/06/2018 for consultative assistance with management of her adrenal insufficiency.   HISTORICAL SUMMARY: The patient was first diagnosed with addison's disease in 2006. She presented with weight loss, hyperpigmentation, and  hypotension. She did not have cosyntropin stim test. She has been on [facility] and Florinef since her diagnosis.   Premature ovarian failure ~ 2017. She did have a postmenopausal bleed between 05/2018- 06/2018. Follows with [facility]    No FH of premature ovarian failure or autoimmune disease.     Testosterone and DHEA-S were not elevated  08/2020-for hair loss    PRE-DIABETES HISTORY :  A1c in 04/2021 was 6.3 %     SUBJECTIVE:    Today (08/26/2021):  Denise Burns is here for a 1 year follow up on adrenal insufficiency. She has been compliant with HC intake.    Since her last visit here she has been diagnosed with nephrolithiasis requiring extraction-follows with urology  Follows with Dr. Landry Mellow (gynecology) for postmenopausal bleeding, had to  She continues to receive intra-articular injections of the spine  Mother with lung and brain sx   Denies dizziness, nausea or diarrhea  Denies leg swelling  Denies cramps     HOME ENDOCRINE MEDICATIONS: Hydrocortisone 15 mg QAM Hydrocortisone 5 mh QPM (between 2-4 pm) Fludrocortisone  0.1 mg , at half a tablet daily        HISTORY:  Past Medical History:  Past Medical History:  Diagnosis Date   Addison disease ([place])    Arthritis    Back pain    Complication of anesthesia     nausea   GERD (gastroesophageal reflux disease)    Past Surgical History:  Past Surgical History:  Procedure Laterality Date   CHOLECYSTECTOMY  2010   CYSTOSCOPY W/ RETROGRADES Right 08/09/2021   Procedure: CYSTOSCOPY WITH RETROGRADE PYELOGRAM;  Surgeon: Hollice Espy, MD;  Location: [facility];  Service: Urology;  Laterality: Right;   CYSTOSCOPY W/ URETERAL STENT REMOVAL Right 08/09/2021   Procedure: CYSTOSCOPY WITH STENT REMOVAL;  Surgeon: Hollice Espy, MD;  Location: [facility];  Service: Urology;  Laterality: Right;   CYSTOSCOPY/URETEROSCOPY/HOLMIUM LASER/STENT PLACEMENT Right 06/28/2021   Procedure: CYSTOSCOPY/URETEROSCOPY/STENT PLACEMENT;  Surgeon: Hollice Espy, MD;  Location: [facility];  Service: Urology;  Laterality: Right;   CYSTOSCOPY/URETEROSCOPY/HOLMIUM LASER/STENT PLACEMENT Right 08/09/2021   Procedure: CYSTOSCOPY/URETEROSCOPY/HOLMIUM LASER/STENT PLACEMENT;  Surgeon: Hollice Espy, MD;  Location: [facility];  Service: Urology;  Laterality: Right;   dental work     DIAGNOSTIC LAPAROSCOPY  2004   endometriosis dx   DILATION AND CURETTAGE OF UTERUS  2010   IR NEPHROSTOMY PLACEMENT LEFT  12/14/2020   NEPHROLITHOTOMY Left 12/14/2020   Procedure: NEPHROLITHOTOMY PERCUTANEOUS;  Surgeon: Hollice Espy, MD;  Location: [facility] OR[facility];  Service: Urology;  Laterality: Left;   POLYPECTOMY     WISDOM TOOTH EXTRACTION     Social History:  reports that she quit smoking about 27 years ago. Her smoking use included cigarettes. She has never used smokeless tobacco. She reports that she does not currently use alcohol. She reports that she  does not use drugs. Family History:  Family History  Problem Relation Age of Onset   Arthritis Mother    Hyperlipidemia Mother    Hypertension Mother    High blood pressure Mother    Alcohol abuse Father    Hearing loss Father    Hyperlipidemia Father    High blood pressure Father    Colon polyps Maternal Grandmother 36   Colon cancer Neg Hx    Esophageal  cancer Neg Hx    Stomach cancer Neg Hx    Rectal cancer Neg Hx      HOME MEDICATIONS: Allergies as of 08/26/2021       Reactions   Sulfacetamide Sodium (acne)    Other reaction(s): sores   Tramadol Other (See Comments)   Other reaction(s): muscle tension   Lisinopril Rash, Cough   Sulfa Antibiotics Other (See Comments)   Blisters in mouth         Medication List        Accurate as of August 26, 2021 11:07 AM. If you have any questions, ask your nurse or doctor.          STOP taking these medications    oxybutynin 5 MG tablet Commonly known as: DITROPAN Stopped by: Dorita Sciara, MD   tamsulosin 0.4 MG Caps capsule Commonly known as: Flomax Stopped by: Dorita Sciara, MD       TAKE these medications    Azelastine HCl 137 MCG/SPRAY Soln Place 1 spray into both nostrils 2 (two) times daily.   cetirizine 10 MG tablet Commonly known as: ZYRTEC Take 10 mg by mouth daily.   diazepam 5 MG tablet Commonly known as: Valium Take one tablet by mouth 60 minutes before scheduled procedure. Wait 30 minutes. If still anxious, take the second tablet 30 minutes before procedure. Must have a driver. (Take 1-2 tablets (5-10 mg total) by mouth 60 (sixty) minutes before procedure for 1 dose. Take one (1) tab (5 mg) 60 minutes before scheduled procedure. Wait 30 minutes. If still anxious, take the second (5 mg) tab 30 min before procedure.)   Flonase Sensimist 27.5 MCG/SPRAY nasal spray Generic drug: fluticasone Place 1 spray into the nose in the morning and at bedtime.   fludrocortisone 0.1 MG tablet Commonly known as: FLORINEF TAKE 1/2 TABLET BY MOUTH DAILY   HYDROcodone-acetaminophen 7.5-325 MG tablet Commonly known as: NORCO Take 1 tablet by mouth every 8 (eight) hours as needed for severe pain. Must last 30 days.   hydrocortisone 10 MG tablet Commonly known as: CORTEF Take 1 and 1/2 tablets by mouth daily with breakfast and 1/2 tablet daily in the  evening   meloxicam 15 MG tablet Commonly known as: MOBIC TAKE 1 TABLET BY MOUTH DAILY   oxyCODONE 5 MG immediate release tablet Commonly known as: Roxicodone Take 1 tablet (5 mg total) by mouth every 4 (four) hours as needed for severe pain.   pseudoephedrine 30 MG tablet Commonly known as: SUDAFED Take 30 mg by mouth every 12 (twelve) hours as needed for congestion.   tiZANidine 4 MG tablet Commonly known as: ZANAFLEX Take 1 tablet (4 mg total) by mouth every 8 (eight) hours as needed for muscle spasms.   Urogesic-Blue 81.6 MG Tabs Take 1 tablet (81.6 mg total) by mouth 4 (four) times daily as needed.          OBJECTIVE:   PHYSICAL EXAM: VS: BP 134/70 (BP Location: Left Arm, Patient Position: Sitting, Cuff Size: Small)  Pulse 88    Ht 5\' 4"  (1.626 m)    Wt 188 lb (85.3 kg)    LMP 12/01/2020 (Approximate)    SpO2 99%    BMI 32.27 kg/m    EXAM: General: Pt appears well and is in NAD  Neck: General: Supple without adenopathy. Thyroid: Thyroid size normal.    Lungs: Clear with good BS bilat with no rales, rhonchi, or wheezes  Heart: Auscultation: RRR.  Abdomen: Normoactive bowel sounds, soft, nontender, without masses or organomegaly palpable  Extremities:  BL LE: No pretibial edema normal ROM and strength.  Mental Status: Judgment, insight: Intact Orientation: Oriented to time, place, and person Mood and affect: No depression, anxiety, or agitation     DATA REVIEWED:   Latest Reference Range & Units 08/26/21 11:40  Sodium 135 - 145 mEq/L 143  Potassium 3.5 - 5.1 mEq/L 4.1  Chloride 96 - 112 mEq/L 105  CO2 19 - 32 mEq/L 28  Glucose 70 - 99 mg/dL 83  BUN 6 - 23 mg/dL 19  Creatinine 0.40 - 1.20 mg/dL 0.81  Calcium 8.4 - 10.5 mg/dL 9.4  GFR >60.00 mL/min 84.36    Latest Reference Range & Units 08/26/21 11:40  Glucose 70 - 99 mg/dL 83  Hemoglobin A1C 4.6 - 6.5 % 6.0      ASSESSMENT / PLAN / RECOMMENDATIONS:   1. Primary Adrenal Insufficiency  (Addison's Disease)     - Reviewed the sick day rule - She already wears a medical alert bracelet. - Has the 10 mg tablets of HC - BMP normal today  - We discussed switching HC to prednisone for ease of taking , but the patient will feels nauseous with prednisone - No changes today    Medications : - Hydrocortisone 15 mg QAM - Hydrocortisone 5 mh QPM (between 2-4 pm) - Continue Fludrocortisone  0.1 mg , at half a tablet daily      2. Premature Ovarian Failure :   -She has established care with Dr. Landry Mellow through [facility].  She is pending a follow-up but has been busy with nephrolithiasis and mother's new diagnosis of cancer -I have encouraged her to follow-up   3. Pre-Diabetes :  - Discussed low carb diet, and exercise which as been difficult giving recent diagnosis of Mother with lung and brain cancer   F/u in 1 yr    Signed electronically by: Mack Guise, MD  [facility]  [address] [place], [place] [address] Phone: [phone] FAX: [phone]      CC: [name], NP [place] [address] Phone: [phone]  Fax: [phone]   Return to Endocrinology clinic as below: Future Appointments  Date Time Provider [place]  08/26/2021 11:10 AM Ita Fritzsche, Melanie Crazier, MD [facility] None  08/30/2021  2:20 PM Milinda Pointer, MD [facility] None  08/31/2021  1:00 PM [facility]  09/07/2021  2:45 PM Hollice Espy, MD [facility] None

## 2021-08-26 ENCOUNTER — Ambulatory Visit: Payer: BC Managed Care – PPO | Admitting: Internal Medicine

## 2021-08-26 ENCOUNTER — Other Ambulatory Visit: Payer: Self-pay

## 2021-08-26 ENCOUNTER — Encounter: Payer: Self-pay | Admitting: Internal Medicine

## 2021-08-26 VITALS — BP 134/70 | HR 88 | Ht 64.0 in | Wt 188.0 lb

## 2021-08-26 DIAGNOSIS — E271 Primary adrenocortical insufficiency: Secondary | ICD-10-CM

## 2021-08-26 DIAGNOSIS — R7303 Prediabetes: Secondary | ICD-10-CM | POA: Insufficient documentation

## 2021-08-26 LAB — BASIC METABOLIC PANEL
BUN: 19 mg/dL (ref 6–23)
CO2: 28 mEq/L (ref 19–32)
Calcium: 9.4 mg/dL (ref 8.4–10.5)
Chloride: 105 mEq/L (ref 96–112)
Creatinine, Ser: 0.81 mg/dL (ref 0.40–1.20)
GFR: 84.36 mL/min (ref >60.00)
Glucose, Bld: 83 mg/dL (ref 70–99)
Potassium: 4.1 mEq/L (ref 3.5–5.1)
Sodium: 143 mEq/L (ref 135–145)

## 2021-08-26 LAB — HEMOGLOBIN A1C: Hgb A1c MFr Bld: 6 % (ref 4.6–6.5)

## 2021-08-26 MED ORDER — HYDROCORTISONE 10 MG PO TABS
ORAL_TABLET | ORAL | 3 refills | Status: DC
  Filled 2021-08-26: qty 200, fill #0
  Filled 2021-10-20: qty 180, 90d supply, fill #0
  Filled 2022-01-06: qty 180, 90d supply, fill #1
  Filled 2022-03-29: qty 60, 30d supply, fill #2
  Filled 2022-05-03: qty 180, 90d supply, fill #3
  Filled 2022-07-20: qty 180, 90d supply, fill #4

## 2021-08-26 MED ORDER — FLUDROCORTISONE ACETATE 0.1 MG PO TABS
ORAL_TABLET | Freq: Every day | ORAL | 3 refills | Status: DC
  Filled 2021-08-26: qty 45, fill #0
  Filled 2021-09-28: qty 45, 90d supply, fill #0
  Filled 2021-12-29: qty 45, 90d supply, fill #1
  Filled 2022-03-21: qty 45, 90d supply, fill #2
  Filled 2022-06-13: qty 45, 90d supply, fill #3

## 2021-08-26 NOTE — Patient Instructions (Signed)
MEDICATIONS:  ° °- Hydrocortisone 15 mg every morning °- Hydrocortisone 5 mg every afternoon ( Between 2-4 pm) °- Continue Florinef at half a tablet daily  ° ° ° ° °ADRENAL INSUFFICIENCY SICK DAY RULES: ° °Should you face an extreme emotional or physical stress such as trauma, surgery or acute illness, this will require extra steroid coverage so that the body can meet that stress.  ° °Without increasing the steroid dose you may experience severe weakness, headache, dizziness, nausea and vomiting and possibly a more serious deterioration in health.  °Typically the dose of steroids will only need to be increased for a couple of days if you have an illness that is transient and managed in the community.  ° °If you are unable to take/absorb an increased dose of steroids orally because of vomiting or diarrhea, you will urgently require steroid injections and should present to an Emergency Department. ° °The general advice for any serious illness is as follows: °Double the normal daily steroid dose for up to 3 days if you have a temperature of more than 37.50C (99.50F) with signs of sickness, or severe emotional or physical distress °Contact your primary care doctor and Endocrinologist if the illness worsens or it lasts for more than 3 days.  °In cases of severe illness, urgent medical assistance should be promptly sought. °If you experience vomiting/diarrhea or are unable to take steroids by mouth, please administer the Hydrocortisone injection kit and seek urgent medical help. ° ° ° ° ° °

## 2021-08-29 NOTE — Progress Notes (Signed)
PROVIDER NOTE: Information contained herein reflects review and annotations entered in association with encounter. Interpretation of such information and data should be left to medically-trained personnel. Information provided to patient can be located elsewhere in the medical record under "Patient Instructions". Document created using STT-dictation technology, any transcriptional errors that may result from process are unintentional.    Patient: Denise Burns  Service Category: E/M  Provider: [name], MD  DOB: Jun 01, 1971  DOS: 08/30/2021  Specialty: Interventional Pain Management  MRN: [id number]  Setting: Ambulatory outpatient  PCP: [name], NP  Type: Established Patient    Referring Provider: [name], NP  Location: Office  Delivery: Face-to-face     HPI  [name], a 51 y.o. year old female, is here today because of her Chronic hip pain, bilateral [M25.551, M25.552, G89.29]. [name]'s primary complain today is Back Pain Last encounter: My last encounter with her was on 08/10/2021. Pertinent problems: [name] has Chronic midline low back pain with bilateral sciatica; Chronic low back pain (1ry area of Pain) (Bilateral) (R>L) w/o sciatica; Chronic sacroiliac joint pain (2ry area of Pain) (Right); Chronic pain syndrome; Abnormal MRI, lumbar spine (03/22/2018); Lumbar facet hypertrophy (Multilevel) (Bilateral); Lumbar facet syndrome (Bilateral); DDD (degenerative disc disease), lumbosacral; Spondylolisthesis (8 mm) at L5-S1 level; Lumbar Grade 1 Retrolisthesis (2 mm) of L2/L3; L5-S1 pars defect w/ spondylolisthesis (Bilateral); Lumbar pars defect (L5-S1) (Bilateral); Lumbar foraminal stenosis (Left: L2-3) (Bilateral: L5-S1); Osteoarthritis of sacroiliac joint (Bilateral); Chronic musculoskeletal pain; Neurogenic pain; Osteoarthritis of facet joint of lumbar spine; Greater trochanteric bursitis (Bilateral); Lumbar spondylosis; Spondylosis without  myelopathy or radiculopathy, lumbosacral region; Chronic hip pain (Right); Osteoarthritis of hip (Right); Lumbar radiculitis (L1/L2) (Right); Chronic groin pain (Right); Chronic hip pain (Bilateral); Enthesopathy hip region (Left); Osteoarthritis of hips (Bilateral); Kissing spine syndrome; Chronic low back pain (Midline) w/o sciatica; and Trigger point with back pain (PSIS area) (Right) on their pertinent problem list. Pain Assessment: Severity of Chronic pain is reported as a 3 /10. Location: Back Lower/radiates to right leg to mid thigh. Onset: More than a month ago. Quality: Aching. Timing: Constant. Modifying factor(s): medications, rest. Vitals:  height is _0  (1.626 m) and weight is 180 lb (81.6 kg). Her blood pressure is 148/99 (abnormal) and her pulse is 104 (abnormal). Her respiration is 18 and oxygen saturation is 100%.   Reason for encounter: medication management.  According to the PMP, the patient did receive foundation of oxycodone from Hollice Espy, MD (urology).  The patient indicates doing well with the current medication regimen. No adverse reactions or side effects reported to the medications.   Therapeutic right lumbar facet RFA x1 (01/07/2020) (4-0) (100/100/90/>75)  Therapeutic left lumbar facet RFA x1 (02/04/2020) (4-0) (100/100/90/>75)   RTCB: 12/02/2021 Nonopioids transferred 05/11/2020: Baclofen and Mobic  Pharmacotherapy Assessment  Analgesic: Hydrocodone/APAP 7.5/325 1 tablet p.o. 3 times daily (22.5 mg/day of hydrocodone)  MME/day: 22.5 mg/day.   Monitoring: [place] PMP: PDMP reviewed during this encounter.       Pharmacotherapy: No side-effects or adverse reactions reported. Compliance: No problems identified. Effectiveness: Clinically acceptable.  Dewayne Shorter, RN  08/30/2021  2:21 PM  Sign when Signing Visit Nursing Pain Medication Assessment:  Safety precautions to be maintained throughout the outpatient stay will include: orient to surroundings, keep bed in low  position, maintain call bell within reach at all times, provide assistance with transfer out of bed and ambulation.  Medication Inspection Compliance: Pill count conducted under aseptic conditions, in  front of the patient. Neither the pills nor the bottle was removed from the patient's sight at any time. Once count was completed pills were immediately returned to the patient in their original bottle.  Medication: Hydrocodone/APAP Pill/Patch Count:  10 of 90 pills remain Pill/Patch Appearance: Markings consistent with prescribed medication Bottle Appearance: Standard pharmacy container. Clearly labeled. Filled Date: 01 / 04 / 2023 Last Medication intake:  Today    UDS:  Summary  Date Value Ref Range Status  01/27/2021 Note  Final    Comment:    ==================================================================== [employer] (MW) ==================================================================== Test                             Result       Flag       Units  Drug Present and Declared for Prescription Verification   Hydrocodone                    544          EXPECTED   ng/mg creat   Hydromorphone                  282          EXPECTED   ng/mg creat   Dihydrocodeine                 100          EXPECTED   ng/mg creat   Norhydrocodone                 735          EXPECTED   ng/mg creat    Sources of hydrocodone include scheduled prescription medications.    Hydromorphone, dihydrocodeine and norhydrocodone are expected    metabolites of hydrocodone. Hydromorphone and dihydrocodeine are    also available as scheduled prescription medications.  ==================================================================== Test                      Result    Flag   Units      Ref Range   Creatinine              171              mg/dL      >=20 ==================================================================== Declared Medications:  The flagging and interpretation on this report are based  on the  following declared medications.  Unexpected results may arise from  inaccuracies in the declared medications.   **Note: The testing scope of this panel includes these medications:   Hydrocodone (Norco)   **Note: The testing scope of this panel does not include the  following reported medications:   Acetaminophen (Norco)  Azelastine  Fludrocortisone (Florinef)  Fluticasone (Flonase)  Hydrocortisone (Cortef)  Meloxicam  Polyethylene Glycol (Plenvu)  Promethazine  Pseudoephedrine (Sudafed)  Tizanidine ==================================================================== For clinical consultation, please call [phone]. ====================================================================      ROS  Constitutional: Denies any fever or chills Gastrointestinal: No reported hemesis, hematochezia, vomiting, or acute GI distress Musculoskeletal: Denies any acute onset joint swelling, redness, loss of ROM, or weakness Neurological: No reported episodes of acute onset apraxia, aphasia, dysarthria, agnosia, amnesia, paralysis, loss of coordination, or loss of consciousness  Medication Review  Azelastine HCl, HYDROcodone-acetaminophen, Urogesic-Blue, cetirizine, fludrocortisone, fluticasone, hydrocortisone, meloxicam, pseudoephedrine, and tiZANidine  History Review  Allergy: [name] is allergic to sulfacetamide sodium (acne), tramadol, lisinopril, and sulfa antibiotics. Drug:  [name]  reports no history of drug use. Alcohol:  reports that she does not currently use alcohol. Tobacco:  reports that she quit smoking about 27 years ago. Her smoking use included cigarettes. She has never used smokeless tobacco. Social: [name]  reports that she quit smoking about 27 years ago. Her smoking use included cigarettes. She has never used smokeless tobacco. She reports that she does not currently use alcohol. She reports that she does not use drugs. Medical:  has a past  medical history of Addison disease ([place]), Arthritis, Back pain, Complication of anesthesia, and GERD (gastroesophageal reflux disease). Surgical: [name]  has a past surgical history that includes Cholecystectomy (2010); Polypectomy; dental work; Dilation and curettage of uterus (2010); IR NEPHROSTOMY PLACEMENT LEFT (12/14/2020); Nephrolithotomy (Left, 12/14/2020); Wisdom tooth extraction; Diagnostic laparoscopy (2004); Cystoscopy/ureteroscopy/holmium laser/stent placement (Right, 06/28/2021); Cystoscopy w/ ureteral stent removal (Right, 08/09/2021); Cystoscopy w/ retrogrades (Right, 08/09/2021); and Cystoscopy/ureteroscopy/holmium laser/stent placement (Right, 08/09/2021). Family: family history includes Alcohol abuse in her father; Arthritis in her mother; Colon polyps (age of onset: 79) in her maternal grandmother; Hearing loss in her father; High blood pressure in her father and mother; Hyperlipidemia in her father and mother; Hypertension in her mother.  Laboratory Chemistry Profile   Renal Lab Results  Component Value Date   BUN 19 08/26/2021   CREATININE 0.81 08/26/2021   BCR 20 04/23/2021   GFR 84.36 08/26/2021   GFRAA 86 08/16/2018   GFRNONAA >60 12/15/2020    Hepatic Lab Results  Component Value Date   AST 21 04/23/2021   ALT 27 04/23/2021   ALBUMIN 4.3 04/23/2021   ALKPHOS 57 04/23/2021   LIPASE 30 10/06/2020    Electrolytes Lab Results  Component Value Date   NA 143 08/26/2021   K 4.1 08/26/2021   CL 105 08/26/2021   CALCIUM 9.4 08/26/2021   MG 2.1 08/16/2018    Bone Lab Results  Component Value Date   VD25OH 13.55 (L) 04/30/2018   25OHVITD1 67 08/16/2018   25OHVITD2 <1.0 08/16/2018   25OHVITD3 67 08/16/2018    Inflammation (CRP: Acute Phase) (ESR: Chronic Phase) Lab Results  Component Value Date   CRP 2 08/16/2018   ESRSEDRATE 37 (H) 08/16/2018   LATICACIDVEN 1.4 10/06/2020         Note: Above Lab results reviewed.  Recent Imaging Review  DG OR UROLOGY  CYSTO IMAGE ([place]) There is no interpretation for this exam.    This order is for images obtained during a surgical procedure.  Please See  "Surgeries" Tab for more information regarding the procedure. Note: Reviewed        Physical Exam  General appearance: Well nourished, well developed, and well hydrated. In no apparent acute distress Mental status: Alert, oriented x 3 (person, place, & time)       Respiratory: No evidence of acute respiratory distress Eyes: PERLA Vitals: BP (!) 148/99 (BP Location: Right Arm, Patient Position: Sitting, Cuff Size: Normal)    Pulse (!) 104    Resp 18    Ht _0  (1.626 m)    Wt 180 lb (81.6 kg)    LMP 12/01/2020 (Approximate)    SpO2 100%    BMI 30.90 kg/m  BMI: Estimated body mass index is 30.9 kg/m as calculated from the following:   Height as of this encounter: _1  (1.626 m).   Weight as of this encounter: 180 lb (81.6 kg). Ideal: Ideal body weight: 54.7 kg (120 lb 9.5 oz) Adjusted  ideal body weight: 65.5 kg (144 lb 5.7 oz)  Assessment   Status Diagnosis  Controlled Controlled Controlled 1. Chronic hip pain (Bilateral)   2. Chronic low back pain (1ry area of Pain) (Bilateral) (R>L) w/o sciatica   3. Lumbar facet syndrome (Bilateral)   4. Chronic sacroiliac joint pain (2ry area of Pain) (Right)   5. Chronic pain syndrome   6. Pharmacologic therapy   7. Chronic use of opiate for therapeutic purpose   8. Encounter for medication management   9. Lumbar pars defect (L5-S1) (Bilateral)   10. Lumbar Grade 1 Retrolisthesis (2 mm) of L2/L3   11. Lumbar facet hypertrophy (Multilevel) (Bilateral)      Updated Problems: No problems updated.  Plan of Care  Problem-specific:  No problem-specific Assessment & Plan notes found for this encounter.  [name] has a current medication list which includes the following long-term medication(s): azelastine, flonase sensimist, [START ON 09/03/2021] hydrocodone-acetaminophen, [START ON  10/03/2021] hydrocodone-acetaminophen, [START ON 11/02/2021] hydrocodone-acetaminophen, and meloxicam.  Pharmacotherapy (Medications Ordered): Meds ordered this encounter  Medications   HYDROcodone-acetaminophen (NORCO) 7.5-325 MG tablet    Sig: Take 1 tablet by mouth every 8 (eight) hours as needed for severe pain. Must last 30 days.    Dispense:  90 tablet    Refill:  0    DO NOT: delete (not duplicate); no partial-fill (will deny script to complete), no refill request (F/U required). DISPENSE: 1 day early if closed on fill date. WARN: No CNS-depressants within 8 hrs of med.   HYDROcodone-acetaminophen (NORCO) 7.5-325 MG tablet    Sig: Take 1 tablet by mouth every 8 (eight) hours as needed for severe pain. Must last 30 days.    Dispense:  90 tablet    Refill:  0    DO NOT: delete (not duplicate); no partial-fill (will deny script to complete), no refill request (F/U required). DISPENSE: 1 day early if closed on fill date. WARN: No CNS-depressants within 8 hrs of med.   HYDROcodone-acetaminophen (NORCO) 7.5-325 MG tablet    Sig: Take 1 tablet by mouth every 8 (eight) hours as needed for severe pain. Must last 30 days.    Dispense:  90 tablet    Refill:  0    DO NOT: delete (not duplicate); no partial-fill (will deny script to complete), no refill request (F/U required). DISPENSE: 1 day early if closed on fill date. WARN: No CNS-depressants within 8 hrs of med.   Orders:  Orders Placed This Encounter  Procedures   Radiofrequency,Lumbar    Standing Status:   Future    Standing Expiration Date:   11/28/2021    Scheduling Instructions:     Side(s): Right-sided     Level: L3-4, L4-5, & L5-S1 Facets (L2, L3, L4, L5, & S1 Medial Branch Nerves)     Sedation: Patient's choice.     Scheduling Timeframe: As soon as pre-approved    Order Specific Question:   Where will this procedure be performed?    Answer:   [facility]   Follow-up plan:   Return for (85mn), (ECT) procedure: (R) L-FCT  RFA #2, (Sed-anx).     Interventional Therapies  Risk   Complexity Considerations:   Estimated body mass index is 30.9 kg/m as calculated from the following:   Height as of this encounter: _0  (1.626 m).   Weight as of this encounter: 180 lb (81.6 kg). WNL   Planned   Pending:   Diagnostic/therapeutic bilateral trochanteric bursa injection #  1    Under consideration:   Diagnostic L2-3 L-FCT BLK #1  Diagnostic bilateral SI joint Blk  Possible bilateral SI joint RFA  Diagnostic bilateral L5 TFESI  Diagnostic left-sided L2 TFESI    Completed:   Therapeutic bilateral lumbar facet block x3 (09/08/2020) (4-0) (100/100/60/60)  Therapeutic right lumbar facet RFA x1 (01/07/2020) (4-0) (100/100/90/>75)  Therapeutic left lumbar facet RFA x1 (02/04/2020) (4-0) (100/100/90/>75)  Therapeutic right IA hip inj. x2 (08/20/2020) (5-1) (100/100/80/80)  Therapeutic left IA hip inj. x1 (08/20/2020) (5-1) (100/100/80/80)    Therapeutic   Palliative (PRN) options:   Palliative bilateral lumbar facet MBB #4  Palliative right lumbar facet RFA #2 (last done 01/07/2020) Palliative left lumbar facet RFA #2 (last done 02/04/2020)    Recent Visits Date Type Provider Dept  07/13/21 Office Visit Milinda Pointer, MD [facility]  06/15/21 Procedure visit Milinda Pointer, MD [facility]  Showing recent visits within past 90 days and meeting all other requirements Today's Visits Date Type Provider Dept  08/30/21 Office Visit Milinda Pointer, MD [facility]  Showing today's visits and meeting all other requirements Future Appointments No visits were found meeting these conditions. Showing future appointments within next 90 days and meeting all other requirements  I discussed the assessment and treatment plan with the patient. The patient was provided an opportunity to ask questions and all were answered. The patient agreed with the plan and demonstrated an understanding of the  instructions.  Patient advised to call back or seek an in-person evaluation if the symptoms or condition worsens.  Duration of encounter: 30 minutes.  Note by: [name], MD Date: 08/30/2021; Time: 2:42 PM

## 2021-08-30 ENCOUNTER — Other Ambulatory Visit: Payer: Self-pay

## 2021-08-30 ENCOUNTER — Ambulatory Visit: Payer: BC Managed Care – PPO | Attending: Pain Medicine | Admitting: Pain Medicine

## 2021-08-30 ENCOUNTER — Encounter: Payer: Self-pay | Admitting: Pain Medicine

## 2021-08-30 VITALS — BP 148/99 | HR 104 | Resp 18 | Ht 64.0 in | Wt 180.0 lb

## 2021-08-30 DIAGNOSIS — M545 Low back pain, unspecified: Secondary | ICD-10-CM | POA: Insufficient documentation

## 2021-08-30 DIAGNOSIS — M533 Sacrococcygeal disorders, not elsewhere classified: Secondary | ICD-10-CM | POA: Insufficient documentation

## 2021-08-30 DIAGNOSIS — Z79891 Long term (current) use of opiate analgesic: Secondary | ICD-10-CM

## 2021-08-30 DIAGNOSIS — Z79899 Other long term (current) drug therapy: Secondary | ICD-10-CM | POA: Diagnosis not present

## 2021-08-30 DIAGNOSIS — M47816 Spondylosis without myelopathy or radiculopathy, lumbar region: Secondary | ICD-10-CM

## 2021-08-30 DIAGNOSIS — M4306 Spondylolysis, lumbar region: Secondary | ICD-10-CM | POA: Diagnosis not present

## 2021-08-30 DIAGNOSIS — M4310 Spondylolisthesis, site unspecified: Secondary | ICD-10-CM

## 2021-08-30 DIAGNOSIS — M25551 Pain in right hip: Secondary | ICD-10-CM | POA: Insufficient documentation

## 2021-08-30 DIAGNOSIS — G8929 Other chronic pain: Secondary | ICD-10-CM | POA: Diagnosis not present

## 2021-08-30 DIAGNOSIS — M25552 Pain in left hip: Secondary | ICD-10-CM | POA: Insufficient documentation

## 2021-08-30 DIAGNOSIS — G894 Chronic pain syndrome: Secondary | ICD-10-CM | POA: Diagnosis not present

## 2021-08-30 MED ORDER — HYDROCODONE-ACETAMINOPHEN 7.5-325 MG PO TABS
1.0000 | ORAL_TABLET | Freq: Three times a day (TID) | ORAL | 0 refills | Status: DC | PRN
  Filled 2021-09-03 (×2): qty 90, 30d supply, fill #0

## 2021-08-30 MED ORDER — HYDROCODONE-ACETAMINOPHEN 7.5-325 MG PO TABS
1.0000 | ORAL_TABLET | Freq: Three times a day (TID) | ORAL | 0 refills | Status: DC | PRN
  Filled 2021-11-03 (×2): qty 90, 30d supply, fill #0

## 2021-08-30 MED ORDER — HYDROCODONE-ACETAMINOPHEN 7.5-325 MG PO TABS
1.0000 | ORAL_TABLET | Freq: Three times a day (TID) | ORAL | 0 refills | Status: DC | PRN
  Filled 2021-10-04 (×2): qty 90, 30d supply, fill #0

## 2021-08-30 NOTE — Patient Instructions (Signed)

## 2021-08-30 NOTE — Progress Notes (Signed)
Nursing Pain Medication Assessment:  Safety precautions to be maintained throughout the outpatient stay will include: orient to surroundings, keep bed in low position, maintain call bell within reach at all times, provide assistance with transfer out of bed and ambulation.  Medication Inspection Compliance: Pill count conducted under aseptic conditions, in front of the patient. Neither the pills nor the bottle was removed from the patient's sight at any time. Once count was completed pills were immediately returned to the patient in their original bottle.  Medication: Hydrocodone/APAP Pill/Patch Count:  10 of 90 pills remain Pill/Patch Appearance: Markings consistent with prescribed medication Bottle Appearance: Standard pharmacy container. Clearly labeled. Filled Date: 01 / 04 / 2023 Last Medication intake:  Today

## 2021-08-31 ENCOUNTER — Ambulatory Visit
Admission: RE | Admit: 2021-08-31 | Discharge: 2021-08-31 | Disposition: A | Discharge status: Home / Self Care | Payer: BC Managed Care – PPO | Source: Ambulatory Visit | Attending: Urology | Admitting: Urology

## 2021-08-31 DIAGNOSIS — N200 Calculus of kidney: Secondary | ICD-10-CM | POA: Insufficient documentation

## 2021-09-03 ENCOUNTER — Other Ambulatory Visit: Payer: Self-pay

## 2021-09-06 NOTE — Progress Notes (Signed)
09/07/21 3:12 PM   Denise Burns 14-May-1971 [id number]  Referring provider:  [name], NP [address] [place],  [place] [address] Chief Complaint  Patient presents with   Nephrolithiasis     HPI: Denise Burns is a 50 y.o.female with with a personal history of nephrolithiasis, who presents today for a 1 month post-up follow-up with RUS.   She has a right ureteral stent in place after experiencing a right proximal ureteral perforation during right ureteroscopy    She underwent uncomplicated left PCNL on 12/15/2020.   On 11/ 28/2022 she underwent right ureteroscopy for right renal stone. Intraoperative findings showed right proximal ureteral perforation, lateral with extravasation this procedure was aborted and stent was placed over safety wire.   She underwent right ureteroscopy with laser lithotripsy, basket extraction, right ureteral sten exchange, right retrograde pyelogram, on 08/09/2021. Intraoperative findings revealed nonobstructing  right lower pole stone obliterated. Slight narrowing at right UPJ but easily traversable.  No further extravasation and appear to be well-healed.  Stent exchanged.  Moderate amount of stent encrustation on previous indwelling stent  RUS on 08/31/2021 visualized no hydronephrosis.  Possible small bilateral renal stones.  She reports that she has urgency that his ongoing since she had stent.  It is improving but has not fully resolved.  She denies ongoing flank pain.  She has been trying to drink more water.  PMH: Past Medical History:  Diagnosis Date   Addison disease ([place])    Arthritis    Back pain    Complication of anesthesia    nausea   GERD (gastroesophageal reflux disease)     Surgical History: Past Surgical History:  Procedure Laterality Date   CHOLECYSTECTOMY  2010   CYSTOSCOPY W/ RETROGRADES Right 08/09/2021   Procedure: CYSTOSCOPY WITH RETROGRADE PYELOGRAM;  Surgeon: Hollice Espy, MD;  Location:  [facility];  Service: Urology;  Laterality: Right;   CYSTOSCOPY W/ URETERAL STENT REMOVAL Right 08/09/2021   Procedure: CYSTOSCOPY WITH STENT REMOVAL;  Surgeon: Hollice Espy, MD;  Location: [facility];  Service: Urology;  Laterality: Right;   CYSTOSCOPY/URETEROSCOPY/HOLMIUM LASER/STENT PLACEMENT Right 06/28/2021   Procedure: CYSTOSCOPY/URETEROSCOPY/STENT PLACEMENT;  Surgeon: Hollice Espy, MD;  Location: [facility];  Service: Urology;  Laterality: Right;   CYSTOSCOPY/URETEROSCOPY/HOLMIUM LASER/STENT PLACEMENT Right 08/09/2021   Procedure: CYSTOSCOPY/URETEROSCOPY/HOLMIUM LASER/STENT PLACEMENT;  Surgeon: Hollice Espy, MD;  Location: [facility];  Service: Urology;  Laterality: Right;   dental work     DIAGNOSTIC LAPAROSCOPY  2004   endometriosis dx   DILATION AND CURETTAGE OF UTERUS  2010   IR NEPHROSTOMY PLACEMENT LEFT  12/14/2020   NEPHROLITHOTOMY Left 12/14/2020   Procedure: NEPHROLITHOTOMY PERCUTANEOUS;  Surgeon: Hollice Espy, MD;  Location: [facility];  Service: Urology;  Laterality: Left;   POLYPECTOMY     WISDOM TOOTH EXTRACTION      Home Medications:  Allergies as of 09/07/2021       Reactions   Sulfacetamide Sodium (acne)    Other reaction(s): sores   Tramadol Other (See Comments)   Other reaction(s): muscle tension   Lisinopril Rash, Cough   Sulfa Antibiotics Other (See Comments)   Blisters in mouth         Medication List        Accurate as of September 07, 2021  3:12 PM. If you have any questions, ask your nurse or doctor.          Azelastine HCl 137 MCG/SPRAY Soln Place 1 spray into both nostrils 2 (two)  times daily.   cetirizine 10 MG tablet Commonly known as: ZYRTEC Take 10 mg by mouth daily.   Flonase Sensimist 27.5 MCG/SPRAY nasal spray Generic drug: fluticasone Place 1 spray into the nose in the morning and at bedtime.   fludrocortisone 0.1 MG tablet Commonly known as: FLORINEF TAKE 1/2 TABLET BY MOUTH DAILY   HYDROcodone-acetaminophen 7.5-325 MG  tablet Commonly known as: NORCO Take 1 tablet by mouth every 8 (eight) hours as needed for severe pain. Must last 30 days.   HYDROcodone-acetaminophen 7.5-325 MG tablet Commonly known as: NORCO Take 1 tablet by mouth every 8 (eight) hours as needed for severe pain. Must last 30 days. Start taking on: October 03, 2021   HYDROcodone-acetaminophen 7.5-325 MG tablet Commonly known as: NORCO Take 1 tablet by mouth every 8 (eight) hours as needed for severe pain. Must last 30 days. Start taking on: November 02, 2021   hydrocortisone 10 MG tablet Commonly known as: CORTEF Take 1 and 1/2 tablets by mouth daily with breakfast and 1/2 tablet daily in the evening   meloxicam 15 MG tablet Commonly known as: MOBIC TAKE 1 TABLET BY MOUTH DAILY   pseudoephedrine 30 MG tablet Commonly known as: SUDAFED Take 30 mg by mouth every 12 (twelve) hours as needed for congestion.   tiZANidine 4 MG tablet Commonly known as: ZANAFLEX Take 1 tablet (4 mg total) by mouth every 8 (eight) hours as needed for muscle spasms.   Urogesic-Blue 81.6 MG Tabs Take 1 tablet (81.6 mg total) by mouth 4 (four) times daily as needed.        Allergies:  Allergies  Allergen Reactions   Sulfacetamide Sodium (Acne)     Other reaction(s): sores   Tramadol Other (See Comments)    Other reaction(s): muscle tension   Lisinopril Rash and Cough   Sulfa Antibiotics Other (See Comments)    Blisters in mouth     Family History: Family History  Problem Relation Age of Onset   Arthritis Mother    Hyperlipidemia Mother    Hypertension Mother    High blood pressure Mother    Alcohol abuse Father    Hearing loss Father    Hyperlipidemia Father    High blood pressure Father    Colon polyps Maternal Grandmother 22   Colon cancer Neg Hx    Esophageal cancer Neg Hx    Stomach cancer Neg Hx    Rectal cancer Neg Hx     Social History:  reports that she quit smoking about 27 years ago. Her smoking use included cigarettes.  She has never used smokeless tobacco. She reports that she does not currently use alcohol. She reports that she does not use drugs.   Physical Exam: BP (!) 142/83    Pulse 93    Ht 5\' 4"  (1.626 m)    Wt 180 lb (81.6 kg)    LMP 12/01/2020 (Approximate)    BMI 30.90 kg/m   Constitutional:  Alert and oriented, No acute distress. HEENT: [place] AT, moist mucus membranes.  Trachea midline, no masses. Cardiovascular: No clubbing, cyanosis, or edema. Respiratory: Normal respiratory effort, no increased work of breathing. Skin: No rashes, bruises or suspicious lesions. Neurologic: Grossly intact, no focal deficits, moving all 4 extremities. Psychiatric: Normal mood and affect.  Laboratory Data:  Lab Results  Component Value Date   CREATININE 0.81 08/26/2021   Lab Results  Component Value Date   HGBA1C 6.0 08/26/2021    Pertinent Imaging: CLINICAL DATA:  Renal stones  EXAM: RENAL / URINARY TRACT ULTRASOUND COMPLETE   COMPARISON:  Previous studies including CT done on 10/06/2020   FINDINGS: Right Kidney:   Renal measurements: 12 x 4.6 x 5.5 cm = volume: 159.3 mL. There is no hydronephrosis. There is 8 mm calculus in the lower pole of right kidney.   Left Kidney:   Renal measurements: 12.4 x 5.6 x 5.5 cm = volume: 197 mL. There is 6 mm hyperechoic focus in the upper pole of left kidney. Staghorn calculus seen in the left kidney in the previous CT is not sonographically evident. There is no hydronephrosis or perinephric fluid collection.   Bladder:   Ureteral jets are observed at both ureterovesical junctions.   Other:   None.   IMPRESSION: There is no hydronephrosis.  Possible small bilateral renal stones.     Electronically Signed   By: Elmer Picker M.D.   On: 09/01/2021 12:06  Ultrasound was personally reviewed.  Agree with radiologic interpretation.  Assessment & Plan:    1.Right kidney stone  -Complicated by ureteral perforation which appears to have  healed well with prolonged stent - S/p ureteroscopy  - RUS shows resolution of hydronephrosis -We discussed general stone prevention techniques including drinking plenty water with goal of producing 2.5 L urine daily, increased citric acid intake, avoidance of high oxalate containing foods, and decreased salt intake.  Information about dietary recommendations given today.    F/u 1 year KUB or sooner as needed  I,Kailey Littlejohn,acting as a scribe for Hollice Espy, MD.,have documented all relevant documentation on the behalf of Hollice Espy, MD,as directed by  Hollice Espy, MD while in the presence of Hollice Espy, [place] [address] [place], [place] [address] [phone]

## 2021-09-07 ENCOUNTER — Ambulatory Visit: Payer: BC Managed Care – PPO | Admitting: Urology

## 2021-09-07 ENCOUNTER — Encounter: Payer: Self-pay | Admitting: Urology

## 2021-09-07 ENCOUNTER — Other Ambulatory Visit: Payer: Self-pay

## 2021-09-07 VITALS — BP 142/83 | HR 93 | Ht 64.0 in | Wt 180.0 lb

## 2021-09-07 DIAGNOSIS — N200 Calculus of kidney: Secondary | ICD-10-CM

## 2021-09-08 ENCOUNTER — Ambulatory Visit: Payer: BC Managed Care – PPO | Admitting: Urology

## 2021-09-08 NOTE — Progress Notes (Signed)
PROVIDER NOTE: Interpretation of information contained herein should be left to medically-trained personnel. Specific patient instructions are provided elsewhere under "Patient Instructions" section of medical record. This document was created in part using STT-dictation technology, any transcriptional errors that may result from this process are unintentional.  Patient: Denise Burns Type: Established DOB: Aug 13, 1970 MRN: [id number] PCP: Denise Freshwater, NP  Service: Procedure DOS: 09/09/2021 Setting: Ambulatory Location: Ambulatory outpatient facility Delivery: Face-to-face Provider: [name], MD Specialty: Interventional Pain Management Specialty designation: 09 Location: Outpatient facility Ref. Prov.: Denise Pointer, MD    Primary Reason for Visit: Interventional Pain Management Treatment. CC: Back Pain (lower)  Procedure:           Type: Lumbar Facet, Medial Branch Radiofrequency Ablation (RFA) #2  Laterality: Right  Level: L2, L3, L4, L5, & S1 Medial Branch Level(s). These levels will denervate the L3-4 and L5-S1 lumbar facet joints.  Imaging: Fluoroscopic guidance Anesthesia: Local anesthesia (1-2% Lidocaine) Anxiolysis: IV Versed         Sedation: Moderate conscious sedation. DOS: 09/09/2021  Performed by: Denise Cola, MD  Purpose: Therapeutic/Palliative Indications: Low back pain severe enough to impact quality of life or function. Indications: 1. Lumbar facet syndrome (Bilateral)   2. Spondylosis without myelopathy or radiculopathy, lumbosacral region   3. Lumbar Grade 1 Retrolisthesis (2 mm) of L2/L3   4. Spondylolisthesis (8 mm) at L5-S1 level   5. Chronic low back pain (1ry area of Pain) (Bilateral) (R>L) w/o sciatica   6. DDD (degenerative disc disease), lumbosacral    [name] has been dealing with the above chronic pain for longer than three months and has either failed to respond, was unable to tolerate, or simply did not get  enough benefit from other more conservative therapies including, but not limited to: 1. Over-the-counter medications 2. Anti-inflammatory medications 3. Muscle relaxants 4. Membrane stabilizers 5. Opioids 6. Physical therapy and/or chiropractic manipulation 7. Modalities (Heat, ice, etc.) 8. Invasive techniques such as nerve blocks. [name] has attained more than 50% relief of the pain from a series of diagnostic injections conducted in separate occasions.  Pain Score: Pre-procedure: 4 /10 Post-procedure: 2 /10     Position / Prep / Materials:  Position: Prone  Prep solution: DuraPrep (Iodine Povacrylex [0.7% available iodine] and Isopropyl Alcohol, 74% w/w) Prep Area: Entire Lumbosacral Region (Lower back from mid-thoracic region to end of tailbone and from flank to flank.) Materials:  Tray: RFA (Radiofrequency) tray Needle(s):  Type: RFA (Teflon-coated radiofrequency ablation needles) Gauge (G): 22  Length: Regular (10cm) Qty: 5  Pre-op H&P Assessment:  [name] is a 51 y.o. (year old), female patient, seen today for interventional treatment. She  has a past surgical history that includes Cholecystectomy (2010); Polypectomy; dental work; Dilation and curettage of uterus (2010); IR NEPHROSTOMY PLACEMENT LEFT (12/14/2020); Nephrolithotomy (Left, 12/14/2020); Wisdom tooth extraction; Diagnostic laparoscopy (2004); Cystoscopy/ureteroscopy/holmium laser/stent placement (Right, 06/28/2021); Cystoscopy w/ ureteral stent removal (Right, 08/09/2021); Cystoscopy w/ retrogrades (Right, 08/09/2021); and Cystoscopy/ureteroscopy/holmium laser/stent placement (Right, 08/09/2021). [name] has a current medication list which includes the following prescription(s): azelastine, cetirizine, fludrocortisone, flonase sensimist, hydrocodone-acetaminophen, [START ON 10/03/2021] hydrocodone-acetaminophen, [START ON 11/02/2021] hydrocodone-acetaminophen, hydrocortisone, meloxicam, urogesic-blue,  oxycodone-acetaminophen, [START ON 09/16/2021] oxycodone-acetaminophen, pseudoephedrine, and tizanidine, and the following Facility-Administered Medications: fentanyl and pentafluoroprop-tetrafluoroeth. Her primarily concern today is the Back Pain (lower)  Initial Vital Signs:  Pulse/HCG Rate: 86ECG Heart Rate: 81 Temp: 97.6 F (36.4 C) Resp: 18 BP: (!) 136/92 SpO2: 99 %  BMI: Estimated body  mass index is 30.9 kg/m as calculated from the following:   Height as of this encounter: 5\' 4"  (1.626 m).   Weight as of this encounter: 180 lb (81.6 kg).  Risk Assessment: Allergies: Reviewed. She is allergic to sulfacetamide sodium (acne), tramadol, lisinopril, and sulfa antibiotics.  Allergy Precautions: None required Coagulopathies: Reviewed. None identified.  Blood-thinner therapy: None at this time Active Infection(s): Reviewed. None identified. [name] is afebrile  Site Confirmation: [name] was asked to confirm the procedure and laterality before marking the site Procedure checklist: Completed Consent: Before the procedure and under the influence of no sedative(s), amnesic(s), or anxiolytics, the patient was informed of the treatment options, risks and possible complications. To fulfill our ethical and legal obligations, as recommended by the American Medical Association's Code of Ethics, I have informed the patient of my clinical impression; the nature and purpose of the treatment or procedure; the risks, benefits, and possible complications of the intervention; the alternatives, including doing nothing; the risk(s) and benefit(s) of the alternative treatment(s) or procedure(s); and the risk(s) and benefit(s) of doing nothing. The patient was provided information about the general risks and possible complications associated with the procedure. These may include, but are not limited to: failure to achieve desired goals, infection, bleeding, organ or nerve damage, allergic reactions,  paralysis, and death. In addition, the patient was informed of those risks and complications associated to Spine-related procedures, such as failure to decrease pain; infection (i.e.: Meningitis, epidural or intraspinal abscess); bleeding (i.e.: epidural hematoma, subarachnoid hemorrhage, or any other type of intraspinal or peri-dural bleeding); organ or nerve damage (i.e.: Any type of peripheral nerve, nerve root, or spinal cord injury) with subsequent damage to sensory, motor, and/or autonomic systems, resulting in permanent pain, numbness, and/or weakness of one or several areas of the body; allergic reactions; (i.e.: anaphylactic reaction); and/or death. Furthermore, the patient was informed of those risks and complications associated with the medications. These include, but are not limited to: allergic reactions (i.e.: anaphylactic or anaphylactoid reaction(s)); adrenal axis suppression; blood sugar elevation that in diabetics may result in ketoacidosis or comma; water retention that in patients with history of congestive heart failure may result in shortness of breath, pulmonary edema, and decompensation with resultant heart failure; weight gain; swelling or edema; medication-induced neural toxicity; particulate matter embolism and blood vessel occlusion with resultant organ, and/or nervous system infarction; and/or aseptic necrosis of one or more joints. Finally, the patient was informed that Medicine is not an exact science; therefore, there is also the possibility of unforeseen or unpredictable risks and/or possible complications that may result in a catastrophic outcome. The patient indicated having understood very clearly. We have given the patient no guarantees and we have made no promises. Enough time was given to the patient to ask questions, all of which were answered to the patient's satisfaction. Ms. Vancamp has indicated that she wanted to continue with the procedure. Attestation: I, the  ordering provider, attest that I have discussed with the patient the benefits, risks, side-effects, alternatives, likelihood of achieving goals, and potential problems during recovery for the procedure that I have provided informed consent. Date   Time: 09/09/2021  8:02 AM  Pre-Procedure Preparation:  Monitoring: As per clinic protocol. Respiration, ETCO2, SpO2, BP, heart rate and rhythm monitor placed and checked for adequate function Safety Precautions: Patient was assessed for positional comfort and pressure points before starting the procedure. Time-out: I initiated and conducted the "Time-out" before starting the procedure, as per protocol. The patient  was asked to participate by confirming the accuracy of the "Time Out" information. Verification of the correct person, site, and procedure were performed and confirmed by me, the nursing staff, and the patient. "Time-out" conducted as per Joint Commission's Universal Protocol (UP.01.01.01). Time: 0850  Description of Procedure:          Laterality: Right Levels:  L2, L3, L4, L5, & S1 Medial Branch Level(s), at the L3-4 and L5-S1 lumbar facet joints. Safety Precautions: Aspiration looking for blood return was conducted prior to all injections. At no point did we inject any substances, as a needle was being advanced. Before injecting, the patient was told to immediately notify me if she was experiencing any new onset of "ringing in the ears, or metallic taste in the mouth". No attempts were made at seeking any paresthesias. Safe injection practices and needle disposal techniques used. Medications properly checked for expiration dates. SDV (single dose vial) medications used. After the completion of the procedure, all disposable equipment used was discarded in the proper designated medical waste containers. Local Anesthesia: Protocol guidelines were followed. The patient was positioned over the fluoroscopy table. The area was prepped in the usual manner.  The time-out was completed. The target area was identified using fluoroscopy. A 12-in long, straight, sterile hemostat was used with fluoroscopic guidance to locate the targets for each level blocked. Once located, the skin was marked with an approved surgical skin marker. Once all sites were marked, the skin (epidermis, dermis, and hypodermis), as well as deeper tissues (fat, connective tissue and muscle) were infiltrated with a small amount of a short-acting local anesthetic, loaded on a 10cc syringe with a 25G, 1.5-in  Needle. An appropriate amount of time was allowed for local anesthetics to take effect before proceeding to the next step. Technical description of process:  Radiofrequency Ablation (RFA) L2 Medial Branch Nerve RFA: The target area for the L2 medial branch is at the junction of the postero-lateral aspect of the superior articular process and the superior, posterior, and medial edge of the transverse process of L3. Under fluoroscopic guidance, a Radiofrequency needle was inserted until contact was made with os over the superior postero-lateral aspect of the pedicular shadow (target area). Sensory and motor testing was conducted to properly adjust the position of the needle. Once satisfactory placement of the needle was achieved, the numbing solution was slowly injected after negative aspiration for blood. 2.0 mL of the nerve block solution was injected without difficulty or complication. After waiting for at least 3 minutes, the ablation was performed. Once completed, the needle was removed intact. L3 Medial Branch Nerve RFA: The target area for the L3 medial branch is at the junction of the postero-lateral aspect of the superior articular process and the superior, posterior, and medial edge of the transverse process of L4. Under fluoroscopic guidance, a Radiofrequency needle was inserted until contact was made with os over the superior postero-lateral aspect of the pedicular shadow (target  area). Sensory and motor testing was conducted to properly adjust the position of the needle. Once satisfactory placement of the needle was achieved, the numbing solution was slowly injected after negative aspiration for blood. 2.0 mL of the nerve block solution was injected without difficulty or complication. After waiting for at least 3 minutes, the ablation was performed. Once completed, the needle was removed intact. L4 Medial Branch Nerve RFA: The target area for the L4 medial branch is at the junction of the postero-lateral aspect of the superior articular process and the  superior, posterior, and medial edge of the transverse process of L5. Under fluoroscopic guidance, a Radiofrequency needle was inserted until contact was made with os over the superior postero-lateral aspect of the pedicular shadow (target area). Sensory and motor testing was conducted to properly adjust the position of the needle. Once satisfactory placement of the needle was achieved, the numbing solution was slowly injected after negative aspiration for blood. 2.0 mL of the nerve block solution was injected without difficulty or complication. After waiting for at least 3 minutes, the ablation was performed. Once completed, the needle was removed intact. L5 Medial Branch Nerve RFA: The target area for the L5 medial branch is at the junction of the postero-lateral aspect of the superior articular process of S1 and the superior, posterior, and medial edge of the sacral ala. Under fluoroscopic guidance, a Radiofrequency needle was inserted until contact was made with os over the superior postero-lateral aspect of the pedicular shadow (target area). Sensory and motor testing was conducted to properly adjust the position of the needle. Once satisfactory placement of the needle was achieved, the numbing solution was slowly injected after negative aspiration for blood. 2.0 mL of the nerve block solution was injected without difficulty or  complication. After waiting for at least 3 minutes, the ablation was performed. Once completed, the needle was removed intact. S1 Medial Branch Nerve RFA: The target area for the S1 medial branch is located inferior to the junction of the S1 superior articular process and the L5 inferior articular process, posterior, inferior, and lateral to the 6 o'clock position of the L5-S1 facet joint, just superior to the S1 posterior foramen. Under fluoroscopic guidance, the Radiofrequency needle was advanced until contact was made with os over the Target area. Sensory and motor testing was conducted to properly adjust the position of the needle. Once satisfactory placement of the needle was achieved, the numbing solution was slowly injected after negative aspiration for blood. 2.0 mL of the nerve block solution was injected without difficulty or complication. After waiting for at least 3 minutes, the ablation was performed. Once completed, the needle was removed intact. Radiofrequency lesioning (ablation):  Radiofrequency Generator: Medtronic AccurianTM AG 1000 RF Generator Sensory Stimulation Parameters: 50 Hz was used to locate & identify the nerve, making sure that the needle was positioned such that there was no sensory stimulation below 0.3 V or above 0.7 V. Motor Stimulation Parameters: 2 Hz was used to evaluate the motor component. Care was taken not to lesion any nerves that demonstrated motor stimulation of the lower extremities at an output of less than 2.5 times that of the sensory threshold, or a maximum of 2.0 V. Lesioning Technique Parameters: Standard Radiofrequency settings. (Not bipolar or pulsed.) Temperature Settings: 80 degrees C Lesioning time: 60 seconds Intra-operative Compliance: Compliant  Once the entire procedure was completed, the treated area was cleaned, making sure to leave some of the prepping solution back to take advantage of its long term bactericidal properties.    Illustration  of the posterior view of the lumbar spine and the posterior neural structures. Laminae of L2 through S1 are labeled. DPRL5, dorsal primary ramus of L5; DPRS1, dorsal primary ramus of S1; DPR3, dorsal primary ramus of L3; FJ, facet (zygapophyseal) joint L3-L4; I, inferior articular process of L4; LB1, lateral branch of dorsal primary ramus of L1; IAB, inferior articular branches from L3 medial branch (supplies L4-L5 facet joint); IBP, intermediate branch plexus; MB3, medial branch of dorsal primary ramus of L3; NR3, third lumbar  nerve root; S, superior articular process of L5; SAB, superior articular branches from L4 (supplies L4-5 facet joint also); TP3, transverse process of L3.  Vitals:   09/09/21 0910 09/09/21 0915 09/09/21 0925 09/09/21 0935  BP: 122/89 (!) 124/91 127/87 119/88  Pulse:      Resp: 13 12 16 16   Temp:      TempSrc:      SpO2: 99% 100% 93% 96%  Weight:      Height:       Start Time: 0850 hrs. End Time: 0915 hrs.  Imaging Guidance (Spinal):          Type of Imaging Technique: Fluoroscopy Guidance (Spinal) Indication(s): Assistance in needle guidance and placement for procedures requiring needle placement in or near specific anatomical locations not easily accessible without such assistance. Exposure Time: Please see nurses notes. Contrast: None used. Fluoroscopic Guidance: I was personally present during the use of fluoroscopy. "Tunnel Vision Technique" used to obtain the best possible view of the target area. Parallax error corrected before commencing the procedure. "Direction-depth-direction" technique used to introduce the needle under continuous pulsed fluoroscopy. Once target was reached, antero-posterior, oblique, and lateral fluoroscopic projection used confirm needle placement in all planes. Images permanently stored in EMR. Interpretation: No contrast injected. I personally interpreted the imaging intraoperatively. Adequate needle placement confirmed in multiple planes.  Permanent images saved into the patient's record.  Antibiotic Prophylaxis:   Anti-infectives (From admission, onward)    None      Indication(s): None identified  Post-operative Assessment:  Post-procedure Vital Signs:  Pulse/HCG Rate: 8671 Temp: 97.6 F (36.4 C) Resp: 16 BP: 119/88 SpO2: 96 %  EBL: None  Complications: No immediate post-treatment complications observed by team, or reported by patient.  Note: The patient tolerated the entire procedure well. A repeat set of vitals were taken after the procedure and the patient was kept under observation following institutional policy, for this type of procedure. Post-procedural neurological assessment was performed, showing return to baseline, prior to discharge. The patient was provided with post-procedure discharge instructions, including a section on how to identify potential problems. Should any problems arise concerning this procedure, the patient was given instructions to immediately contact us, at any time, without hesitation. In any case, we plan to contact the patient by telephone for a follow-up status report regarding this interventional procedure.  Comments:  No additional relevant information.  Plan of Care  Orders:  Orders Placed This Encounter  Procedures   Radiofrequency,Lumbar    Scheduling Instructions:     Side(s): Right-sided     Level: L3-4, L4-5, & L5-S1 Facets (L2, L3, L4, L5, & S1 Medial Branch Nerves)     Sedation: Patient's choice.     Timeframe: Today    Order Specific Question:   Where will this procedure be performed?    Answer:   [facility]   Radiofrequency,Lumbar    Standing Status:   Future    Standing Expiration Date:   12/07/2021    Scheduling Instructions:     Side(s): Left-sided     Level: L3-4, L4-5, & L5-S1 Facets (L2, L3, L4, L5, & S1 Medial Branch Nerves)     Sedation: With Sedation.     Scheduling Timeframe: 6 weeks from now    Order Specific Question:   Where will this  procedure be performed?    Answer:   [facility]   DG PAIN CLINIC C-ARM 1-60 MIN NO REPORT    Intraoperative interpretation by procedural physician  at [facility].    Standing Status:   Standing    Number of Occurrences:   1    Order Specific Question:   Reason for exam:    Answer:   Assistance in needle guidance and placement for procedures requiring needle placement in or near specific anatomical locations not easily accessible without such assistance.   Informed Consent Details: Physician/Practitioner Attestation; Transcribe to consent form and obtain patient signature    Nursing Order: Transcribe to consent form and obtain patient signature. Note: Always confirm laterality of pain with [name], before procedure.    Order Specific Question:   Physician/Practitioner attestation of informed consent for procedure/surgical case    Answer:   I, the physician/practitioner, attest that I have discussed with the patient the benefits, risks, side effects, alternatives, likelihood of achieving goals and potential problems during recovery for the procedure that I have provided informed consent.    Order Specific Question:   Procedure    Answer:   Lumbar Facet Radiofrequency Ablation    Order Specific Question:   Physician/Practitioner performing the procedure    Answer:   Brannon Levene A. Dossie Arbour, MD    Order Specific Question:   Indication/Reason    Answer:   Low Back Pain, with our without leg pain, due to Facet Joint Arthralgia (Joint Pain) known as Lumbar Facet Syndrome, secondary to Lumbar, and/or Lumbosacral Spondylosis (Arthritis of the Spine), without myelopathy or radiculopathy (Nerve Damage).   Provide equipment / supplies at bedside    "Radiofrequency Tray"; Large hemostat (x1); Small hemostat (x1); Towels (x8); 4x4 sterile sponge pack (x1) Needle type: Teflon-coated Radiofrequency Needle (Disposable   single use) Size: Regular Quantity: 5    Standing Status:    Standing    Number of Occurrences:   1    Order Specific Question:   Specify    Answer:   Radiofrequency Tray   Chronic Opioid Analgesic:  Hydrocodone/APAP 7.5/325 1 tablet p.o. 3 times daily (22.5 mg/day of hydrocodone)  MME/day: 22.5 mg/day.   Medications ordered for procedure: Meds ordered this encounter  Medications   lidocaine (XYLOCAINE) 2 % (with pres) injection 400 mg   pentafluoroprop-tetrafluoroeth (GEBAUERS) aerosol   lactated ringers infusion 1,000 mL   midazolam (VERSED) 5 MG/5ML injection 0.5-2 mg    Make sure Flumazenil is available in the pyxis when using this medication. If oversedation occurs, administer 0.2 mg IV over 15 sec. If after 45 sec no response, administer 0.2 mg again over 1 min; may repeat at 1 min intervals; not to exceed 4 doses (1 mg)   fentaNYL (SUBLIMAZE) injection 25-50 mcg    Make sure Narcan is available in the pyxis when using this medication. In the event of respiratory depression (RR< 8/min): Titrate NARCAN (naloxone) in increments of 0.1 to 0.2 mg IV at 2-3 minute intervals, until desired degree of reversal.   ropivacaine (PF) 2 mg/mL (0.2%) (NAROPIN) injection 9 mL   triamcinolone acetonide (KENALOG-40) injection 40 mg   oxyCODONE-acetaminophen (PERCOCET) 5-325 MG tablet    Sig: Take 1 tablet by mouth every 8 (eight) hours as needed for up to 7 days for severe pain. Must last 7 days.    Dispense:  21 tablet    Refill:  0    For acute post-operative pain. Not to be refilled.  Must last 7 days.   oxyCODONE-acetaminophen (PERCOCET) 5-325 MG tablet    Sig: Take 1 tablet by mouth every 8 (eight) hours as needed for up to 7 days  for severe pain. Must last 7 days.    Dispense:  21 tablet    Refill:  0    For acute post-operative pain. Not to be refilled.  Must last 7 days.   Medications administered: We administered lidocaine, lactated ringers, midazolam, fentaNYL, ropivacaine (PF) 2 mg/mL (0.2%), and triamcinolone acetonide.  See the medical  record for exact dosing, route, and time of administration.  Follow-up plan:   Return in about 6 weeks (around 10/21/2021) for (34min), (ECT) procedure: (L) L-FCT RFA #2.       Interventional Therapies  Risk   Complexity Considerations:   Estimated body mass index is 30.9 kg/m as calculated from the following:   Height as of this encounter: 5\' 4"  (1.626 m).   Weight as of this encounter: 180 lb (81.6 kg). WNL   Planned   Pending:   Diagnostic/therapeutic bilateral trochanteric bursa injection #1    Under consideration:   Diagnostic L2-3 L-FCT BLK #1  Diagnostic bilateral SI joint Blk  Possible bilateral SI joint RFA  Diagnostic bilateral L5 TFESI  Diagnostic left-sided L2 TFESI    Completed:   Therapeutic bilateral lumbar facet block x3 (09/08/2020) (4-0) (100/100/60/60)  Therapeutic right lumbar facet RFA x1 (01/07/2020) (4-0) (100/100/90/>75)  Therapeutic left lumbar facet RFA x1 (02/04/2020) (4-0) (100/100/90/>75)  Therapeutic right IA hip inj. x2 (08/20/2020) (5-1) (100/100/80/80)  Therapeutic left IA hip inj. x1 (08/20/2020) (5-1) (100/100/80/80)    Therapeutic   Palliative (PRN) options:   Palliative bilateral lumbar facet MBB #4  Palliative right lumbar facet RFA #2 (last done 01/07/2020) Palliative left lumbar facet RFA #2 (last done 02/04/2020)    Recent Visits Date Type Provider Dept  08/30/21 Office Visit Denise Pointer, MD [facility]  07/13/21 Office Visit Denise Pointer, MD [facility]  06/15/21 Procedure visit Denise Pointer, MD [facility]  Showing recent visits within past 90 days and meeting all other requirements Today's Visits Date Type Provider Dept  09/09/21 Procedure visit Denise Pointer, MD [facility]  Showing today's visits and meeting all other requirements Future Appointments Date Type Provider Dept  11/29/21 Appointment Denise Pointer, MD [facility]  Showing future appointments  within next 90 days and meeting all other requirements  Disposition: Discharge home  Discharge (Date   Time): 09/09/2021; 0942 hrs.   Primary Care Physician: Denise Freshwater, NP Location: [facility] Note by: Denise Cola, MD Date: 09/09/2021; Time: 11:48 AM  Disclaimer:  Medicine is not an Chief Strategy Officer. The only guarantee in medicine is that nothing is guaranteed. It is important to note that the decision to proceed with this intervention was based on the information collected from the patient. The Data and conclusions were drawn from the patient's questionnaire, the interview, and the physical examination. Because the information was provided in large part by the patient, it cannot be guaranteed that it has not been purposely or unconsciously manipulated. Every effort has been made to obtain as much relevant data as possible for this evaluation. It is important to note that the conclusions that lead to this procedure are derived in large part from the available data. Always take into account that the treatment will also be dependent on availability of resources and existing treatment guidelines, considered by other Pain Management Practitioners as being common knowledge and practice, at the time of the intervention. For Medico-Legal purposes, it is also important to point out that variation in procedural techniques and pharmacological choices are the  acceptable norm. The indications, contraindications, technique, and results of the above procedure should only be interpreted and judged by a Board-Certified Interventional Pain Specialist with extensive familiarity and expertise in the same exact procedure and technique.

## 2021-09-09 ENCOUNTER — Other Ambulatory Visit: Payer: Self-pay

## 2021-09-09 ENCOUNTER — Encounter: Payer: Self-pay | Admitting: Pain Medicine

## 2021-09-09 ENCOUNTER — Ambulatory Visit
Admission: RE | Admit: 2021-09-09 | Discharge: 2021-09-09 | Disposition: A | Discharge status: Home / Self Care | Payer: BC Managed Care – PPO | Source: Ambulatory Visit | Attending: Pain Medicine | Admitting: Pain Medicine

## 2021-09-09 ENCOUNTER — Ambulatory Visit: Payer: BC Managed Care – PPO | Admitting: Pain Medicine

## 2021-09-09 VITALS — BP 119/88 | HR 86 | Temp 97.6°F | Resp 16 | Ht 64.0 in | Wt 180.0 lb

## 2021-09-09 DIAGNOSIS — M4306 Spondylolysis, lumbar region: Secondary | ICD-10-CM

## 2021-09-09 DIAGNOSIS — G8929 Other chronic pain: Secondary | ICD-10-CM | POA: Diagnosis not present

## 2021-09-09 DIAGNOSIS — M5137 Other intervertebral disc degeneration, lumbosacral region: Secondary | ICD-10-CM | POA: Insufficient documentation

## 2021-09-09 DIAGNOSIS — M25551 Pain in right hip: Secondary | ICD-10-CM | POA: Diagnosis not present

## 2021-09-09 DIAGNOSIS — M47816 Spondylosis without myelopathy or radiculopathy, lumbar region: Secondary | ICD-10-CM | POA: Diagnosis not present

## 2021-09-09 DIAGNOSIS — M4317 Spondylolisthesis, lumbosacral region: Secondary | ICD-10-CM | POA: Insufficient documentation

## 2021-09-09 DIAGNOSIS — G8918 Other acute postprocedural pain: Secondary | ICD-10-CM | POA: Diagnosis not present

## 2021-09-09 DIAGNOSIS — M4310 Spondylolisthesis, site unspecified: Secondary | ICD-10-CM | POA: Diagnosis not present

## 2021-09-09 DIAGNOSIS — M25552 Pain in left hip: Secondary | ICD-10-CM

## 2021-09-09 DIAGNOSIS — M47817 Spondylosis without myelopathy or radiculopathy, lumbosacral region: Secondary | ICD-10-CM

## 2021-09-09 DIAGNOSIS — M545 Low back pain, unspecified: Secondary | ICD-10-CM

## 2021-09-09 MED ORDER — OXYCODONE-ACETAMINOPHEN 5-325 MG PO TABS
1.0000 | ORAL_TABLET | Freq: Three times a day (TID) | ORAL | 0 refills | Status: DC | PRN
  Filled 2021-09-16: qty 21, 7d supply, fill #0

## 2021-09-09 MED ORDER — LIDOCAINE HCL 2 % IJ SOLN
20.0000 mL | Freq: Once | INTRAMUSCULAR | Status: AC
  Administered 2021-09-09: 400 mg

## 2021-09-09 MED ORDER — TRIAMCINOLONE ACETONIDE 40 MG/ML IJ SUSP
40.0000 mg | Freq: Once | INTRAMUSCULAR | Status: AC
  Administered 2021-09-09: 40 mg

## 2021-09-09 MED ORDER — FENTANYL CITRATE (PF) 100 MCG/2ML IJ SOLN
25.0000 ug | INTRAMUSCULAR | Status: DC | PRN
  Administered 2021-09-09: 50 ug via INTRAVENOUS

## 2021-09-09 MED ORDER — PENTAFLUOROPROP-TETRAFLUOROETH EX AERO
INHALATION_SPRAY | Freq: Once | CUTANEOUS | Status: DC
  Filled 2021-09-09: qty 116

## 2021-09-09 MED ORDER — MIDAZOLAM HCL 5 MG/5ML IJ SOLN
0.5000 mg | Freq: Once | INTRAMUSCULAR | Status: AC
  Administered 2021-09-09: 1.5 mg via INTRAVENOUS

## 2021-09-09 MED ORDER — OXYCODONE-ACETAMINOPHEN 5-325 MG PO TABS
1.0000 | ORAL_TABLET | Freq: Three times a day (TID) | ORAL | 0 refills | Status: DC | PRN
  Filled 2021-09-09: qty 21, 7d supply, fill #0

## 2021-09-09 MED ORDER — MIDAZOLAM HCL 5 MG/5ML IJ SOLN
INTRAMUSCULAR | Status: AC
  Filled 2021-09-09: qty 5

## 2021-09-09 MED ORDER — ROPIVACAINE HCL 2 MG/ML IJ SOLN
9.0000 mL | Freq: Once | INTRAMUSCULAR | Status: AC
  Administered 2021-09-09: 9 mL via PERINEURAL

## 2021-09-09 MED ORDER — LACTATED RINGERS IV SOLN
1000.0000 mL | Freq: Once | INTRAVENOUS | Status: AC
  Administered 2021-09-09: 1000 mL via INTRAVENOUS

## 2021-09-09 MED ORDER — FENTANYL CITRATE (PF) 100 MCG/2ML IJ SOLN
INTRAMUSCULAR | Status: AC
  Filled 2021-09-09: qty 2

## 2021-09-09 NOTE — Patient Instructions (Signed)
___________________________________________________________________________________________  Post-Radiofrequency (RF) Discharge Instructions  You have just completed a Radiofrequency Neurotomy.  The following instructions will provide you with information and guidelines for self-care upon discharge.  If at any time you have questions or concerns please call your physician. DO NOT DRIVE YOURSELF!!  Instructions: Apply ice: Fill a plastic sandwich bag with crushed ice. Cover it with a small towel and apply to injection site. Apply for 15 minutes then remove x 15 minutes. Repeat sequence on day of procedure, until you go to bed. The purpose is to minimize swelling and discomfort after procedure. Apply heat: Apply heat to procedure site starting the day following the procedure. The purpose is to treat any soreness and discomfort from the procedure. Food intake: No eating limitations, unless stipulated above.  Nevertheless, if you have had sedation, you may experience some nausea.  In this case, it may be wise to wait at least two hours prior to resuming regular diet. Physical activities: Keep activities to a minimum for the first 8 hours after the procedure. For the first 24 hours after the procedure, do not drive a motor vehicle,  Operate heavy machinery, power tools, or handle any weapons.  Consider walking with the use of an assistive device or accompanied by an adult for the first 24 hours.  Do not drink alcoholic beverages including beer.  Do not make any important decisions or sign any legal documents. Go home and rest today.  Resume activities tomorrow, as tolerated.  Use caution in moving about as you may experience mild leg weakness.  Use caution in cooking, use of household electrical appliances and climbing steps. Driving: If you have received any sedation, you are not allowed to drive for 24 hours after your procedure. Blood thinner: Restart your blood thinner 6 hours after your procedure. (Only  for those taking blood thinners) Insulin: As soon as you can eat, you may resume your normal dosing schedule. (Only for those taking insulin) Medications: May resume pre-procedure medications.  Do not take any drugs, other than what has been prescribed to you. Infection prevention: Keep procedure site clean and dry. Post-procedure Pain Diary: Extremely important that this be done correctly and accurately. Recorded information will be used to determine the next step in treatment. Pain evaluated is that of treated area only. Do not include pain from an untreated area. Complete every hour, on the hour, for the initial 8 hours. Set an alarm to help you do this part accurately. Do not go to sleep and have it completed later. It will not be accurate. Follow-up appointment: Keep your follow-up appointment after the procedure. Usually 2-6 weeks after radiofrequency. Bring you pain diary. The information collected will be essential for your long-term care.   Expect: From numbing medicine (AKA: Local Anesthetics): Numbness or decrease in pain. Onset: Full effect within 15 minutes of injected. Duration: It will depend on the type of local anesthetic used. On the average, 1 to 8 hours.  From steroids (when added): Decrease in swelling or inflammation. Once inflammation is improved, relief of the pain will follow. Onset of benefits: Depends on the amount of swelling present. The more swelling, the longer it will take for the benefits to be seen. In some cases, up to 10 days. Duration: Steroids will stay in the system x 2 weeks. Duration of benefits will depend on multiple posibilities including persistent irritating factors. From procedure: Some discomfort is to be expected once the numbing medicine wears off. In the case of radiofrequency procedures,  this may last as long as 6 weeks. Additional post-procedure pain medication is provided for this. Discomfort is minimized if ice and heat are applied as  instructed.  Call if: You experience numbness and weakness that gets worse with time, as opposed to wearing off. He experience any unusual bleeding, difficulty breathing, or loss of the ability to control your bowel and bladder. (This applies to Spinal procedures only) You experience any redness, swelling, heat, red streaks, elevated temperature, fever, or any other signs of a possible infection.  Emergency Numbers: [place] hours (Monday - Thursday, 8:00 AM - 4:00 PM) (Friday, 9:00 AM - 12:00 Noon): [phone] After hours: [phone] ____________________________________________________________________________________________  ______________________________________________________________________  Preparing for Procedure with Sedation  NOTICE: Due to recent regulatory changes, starting on March 01, 2021, procedures requiring intravenous (IV) sedation will no longer be performed at the [place].  These types of procedures are required to be performed at [facility] ambulatory surgery facility.  We are very sorry for the inconvenience.  Procedure appointments are limited to planned procedures: No Prescription Refills. No disability issues will be discussed. No medication changes will be discussed.  Instructions: Oral Intake: Do not eat or drink anything for at least 8 hours prior to your procedure. (Exception: Blood Pressure Medication. See below.) Transportation: A driver is required. You may not drive yourself after the procedure. Blood Pressure Medicine: Do not forget to take your blood pressure medicine with a sip of water the morning of the procedure. If your Diastolic (lower reading) is above 100 mmHg, elective cases will be cancelled/rescheduled. Blood thinners: These will need to be stopped for procedures. Notify our staff if you are taking any blood thinners. Depending on which one you take, there will be specific instructions on how and when to stop  it. Diabetics on insulin: Notify the staff so that you can be scheduled 1st case in the morning. If your diabetes requires high dose insulin, take only  of your normal insulin dose the morning of the procedure and notify the staff that you have done so. Preventing infections: Shower with an antibacterial soap the morning of your procedure. Build-up your immune system: Take 1000 mg of Vitamin C with every meal (3 times a day) the day prior to your procedure. Antibiotics: Inform the staff if you have a condition or reason that requires you to take antibiotics before dental procedures. Pregnancy: If you are pregnant, call and cancel the procedure. Sickness: If you have a cold, fever, or any active infections, call and cancel the procedure. Arrival: You must be in the facility at least 30 minutes prior to your scheduled procedure. Children: Do not bring children with you. Dress appropriately: Bring dark clothing that you would not mind if they get stained. Valuables: Do not bring any jewelry or valuables.  Reasons to call and reschedule or cancel your procedure: (Following these recommendations will minimize the risk of a serious complication.) Surgeries: Avoid having procedures within 2 weeks of any surgery. (Avoid for 2 weeks before or after any surgery). Flu Shots: Avoid having procedures within 2 weeks of a flu shots. (Avoid for 2 weeks before or after immunizations). Barium: Avoid having a procedure within 7-10 days after having had a radiological study involving the use of radiological contrast. (Myelograms, Barium swallow or enema study). Heart attacks: Avoid any elective procedures or surgeries for the initial 6 months after a "Myocardial Infarction" (Heart Attack). Blood thinners: It is imperative that you stop these medications before procedures. Let  us know if you if you take any blood thinner.  Infection: Avoid procedures during or within two weeks of an infection (including chest colds or  gastrointestinal problems). Symptoms associated with infections include: Localized redness, fever, chills, night sweats or profuse sweating, burning sensation when voiding, cough, congestion, stuffiness, runny nose, sore throat, diarrhea, nausea, vomiting, cold or Flu symptoms, recent or current infections. It is specially important if the infection is over the area that we intend to treat. Heart and lung problems: Symptoms that may suggest an active cardiopulmonary problem include: cough, chest pain, breathing difficulties or shortness of breath, dizziness, ankle swelling, uncontrolled high or unusually low blood pressure, and/or palpitations. If you are experiencing any of these symptoms, cancel your procedure and contact your primary care physician for an evaluation.  Remember:  Regular Business hours are:  Monday to Thursday 8:00 AM to 4:00 PM  Provider's Schedule: Milinda Pointer, MD:  Procedure days: Tuesday and Thursday 7:30 AM to 4:00 PM  Gillis Santa, MD:  Procedure days: Monday and Wednesday 7:30 AM to 4:00 PM ______________________________________________________________________  ____________________________________________________________________________________________  General Risks and Possible Complications  Patient Responsibilities: It is important that you read this as it is part of your informed consent. It is our duty to inform you of the risks and possible complications associated with treatments offered to you. It is your responsibility as a patient to read this and to ask questions about anything that is not clear or that you believe was not covered in this document.  Patients Rights: You have the right to refuse treatment. You also have the right to change your mind, even after initially having agreed to have the treatment done. However, under this last option, if you wait until the last second to change your mind, you may be charged for the materials used up to that  point.  Introduction: Medicine is not an Chief Strategy Officer. Everything in Medicine, including the lack of treatment(s), carries the potential for danger, harm, or loss (which is by definition: Risk). In Medicine, a complication is a secondary problem, condition, or disease that can aggravate an already existing one. All treatments carry the risk of possible complications. The fact that a side effects or complications occurs, does not imply that the treatment was conducted incorrectly. It must be clearly understood that these can happen even when everything is done following the highest safety standards.  No treatment: You can choose not to proceed with the proposed treatment alternative. The PRO(s) would include: avoiding the risk of complications associated with the therapy. The CON(s) would include: not getting any of the treatment benefits. These benefits fall under one of three categories: diagnostic; therapeutic; and/or palliative. Diagnostic benefits include: getting information which can ultimately lead to improvement of the disease or symptom(s). Therapeutic benefits are those associated with the successful treatment of the disease. Finally, palliative benefits are those related to the decrease of the primary symptoms, without necessarily curing the condition (example: decreasing the pain from a flare-up of a chronic condition, such as incurable terminal cancer).  General Risks and Complications: These are associated to most interventional treatments. They can occur alone, or in combination. They fall under one of the following six (6) categories: no benefit or worsening of symptoms; bleeding; infection; nerve damage; allergic reactions; and/or death. No benefits or worsening of symptoms: In Medicine there are no guarantees, only probabilities. No healthcare provider can ever guarantee that a medical treatment will work, they can only state the probability that it may. Furthermore,  there is always the  possibility that the condition may worsen, either directly, or indirectly, as a consequence of the treatment. Bleeding: This is more common if the patient is taking a blood thinner, either prescription or over the counter (example: Goody Powders, Fish oil, Aspirin, Garlic, etc.), or if suffering a condition associated with impaired coagulation (example: Hemophilia, cirrhosis of the liver, low platelet counts, etc.). However, even if you do not have one on these, it can still happen. If you have any of these conditions, or take one of these drugs, make sure to notify your treating physician. Infection: This is more common in patients with a compromised immune system, either due to disease (example: diabetes, cancer, human immunodeficiency virus [HIV], etc.), or due to medications or treatments (example: therapies used to treat cancer and rheumatological diseases). However, even if you do not have one on these, it can still happen. If you have any of these conditions, or take one of these drugs, make sure to notify your treating physician. Nerve Damage: This is more common when the treatment is an invasive one, but it can also happen with the use of medications, such as those used in the treatment of cancer. The damage can occur to small secondary nerves, or to large primary ones, such as those in the spinal cord and brain. This damage may be temporary or permanent and it may lead to impairments that can range from temporary numbness to permanent paralysis and/or brain death. Allergic Reactions: Any time a substance or material comes in contact with our body, there is the possibility of an allergic reaction. These can range from a mild skin rash (contact dermatitis) to a severe systemic reaction (anaphylactic reaction), which can result in death. Death: In general, any medical intervention can result in death, most of the time due to an unforeseen  complication. ____________________________________________________________________________________________

## 2021-09-10 ENCOUNTER — Telehealth: Payer: Self-pay

## 2021-09-10 NOTE — Telephone Encounter (Signed)
Post procedure phone call.  LM 

## 2021-09-13 ENCOUNTER — Other Ambulatory Visit: Payer: Self-pay | Admitting: Nurse Practitioner

## 2021-09-13 ENCOUNTER — Other Ambulatory Visit: Payer: Self-pay

## 2021-09-13 DIAGNOSIS — M47816 Spondylosis without myelopathy or radiculopathy, lumbar region: Secondary | ICD-10-CM

## 2021-09-13 MED FILL — Meloxicam Tab 15 MG: ORAL | 90 days supply | Qty: 90 | Fill #0 | Status: AC

## 2021-09-13 NOTE — Telephone Encounter (Signed)
Patient will need an appointment for next refill

## 2021-09-14 ENCOUNTER — Other Ambulatory Visit: Payer: Self-pay

## 2021-09-16 ENCOUNTER — Other Ambulatory Visit: Payer: Self-pay

## 2021-09-29 ENCOUNTER — Other Ambulatory Visit: Payer: Self-pay

## 2021-10-04 ENCOUNTER — Other Ambulatory Visit: Payer: Self-pay

## 2021-10-06 NOTE — Progress Notes (Signed)
PROVIDER NOTE: Interpretation of information contained herein should be left to medically-trained personnel. Specific patient instructions are provided elsewhere under "Patient Instructions" section of medical record. This document was created in part using STT-dictation technology, any transcriptional errors that may result from this process are unintentional.  Patient: Denise Burns Type: Established DOB: 1970/12/27 MRN: [id number] PCP: [name], NP  Service: Procedure DOS: 10/07/2021 Setting: Ambulatory Location: Ambulatory outpatient facility Delivery: Face-to-face Provider: [name], MD Specialty: Interventional Pain Management Specialty designation: 09 Location: Outpatient facility Ref. Prov.: [name], NP    Primary Reason for Visit: Interventional Pain Management Treatment. CC: Back Pain (Low and worse on the left)  Procedure:           Type: Lumbar Facet, Medial Branch Radiofrequency Ablation (RFA) #2  Laterality: Left  Level: L2, L3, L4, L5, & S1 Medial Branch Level(s). These levels will denervate the L3-4 and L5-S1 lumbar facet joints.  Imaging: Fluoroscopic guidance Anesthesia: Local anesthesia (1-2% Lidocaine) Anxiolysis: IV Versed         Sedation: Moderate conscious sedation. DOS: 10/07/2021  Performed by: [name], MD  Purpose: Therapeutic/Palliative Indications: Low back pain severe enough to impact quality of life or function. Indications: 1. Lumbar facet syndrome (Bilateral)   2. Spondylosis without myelopathy or radiculopathy, lumbosacral region   3. Lumbar facet hypertrophy (Multilevel) (Bilateral)   4. Lumbar Grade 1 Retrolisthesis (2 mm) of L2/L3   5. Lumbar pars defect (L5-S1) (Bilateral)   6. Osteoarthritis of facet joint of lumbar spine   7. Spondylolisthesis (8 mm) at L5-S1 level   8. DDD (degenerative disc disease), lumbosacral   9. Chronic low back pain (1ry area of Pain) (Bilateral) (R>L) w/o sciatica     Denise Burns has been dealing with the above chronic pain for longer than three months and has either failed to respond, was unable to tolerate, or simply did not get enough benefit from other more conservative therapies including, but not limited to: 1. Over-the-counter medications 2. Anti-inflammatory medications 3. Muscle relaxants 4. Membrane stabilizers 5. Opioids 6. Physical therapy and/or chiropractic manipulation 7. Modalities (Heat, ice, etc.) 8. Invasive techniques such as nerve blocks. [name] has attained more than 50% relief of the pain from a series of diagnostic injections conducted in separate occasions.  Pain Score: Pre-procedure: 4 /10 Post-procedure: 0-No pain/10     Position / Prep / Materials:  Position: Prone  Prep solution: DuraPrep (Iodine Povacrylex [0.7% available iodine] and Isopropyl Alcohol, 74% w/w) Prep Area: Entire Lumbosacral Region (Lower back from mid-thoracic region to end of tailbone and from flank to flank.) Materials:  Tray: RFA (Radiofrequency) tray Needle(s):  Type: RFA (Teflon-coated radiofrequency ablation needles) Gauge (G): 22  Length: Regular (10cm) Qty: 5  Post-procedure evaluation    Type: Lumbar Facet, Medial Branch Radiofrequency Ablation (RFA) #2  Laterality: Right  Level: L2, L3, L4, L5, & S1 Medial Branch Level(s). These levels will denervate the L3-4 and L5-S1 lumbar facet joints.  Imaging: Fluoroscopic guidance Anesthesia: Local anesthesia (1-2% Lidocaine) Anxiolysis: IV Versed         Sedation: Moderate conscious sedation. DOS: 09/09/2021  Performed by: [name], MD  Purpose: Therapeutic/Palliative Indications: Low back pain severe enough to impact quality of life or function. Indications: 1. Lumbar facet syndrome (Bilateral)    Pain Score: Pre-procedure: 4 /10 Post-procedure: 2 /10     Effectiveness:  Initial hour after procedure: 100 %. Subsequent 4-6 hours post-procedure: 100  %. Analgesia past initial  6 hours: 80 %. Ongoing improvement:  Analgesic: The patient indicates having an ongoing 80% relief of the pain on the area of the right lower back where she had the radiofrequency ablation done. Function: [name] reports improvement in function ROM: [name] reports improvement in ROM  Pre-op H&P Assessment:  Denise Burns is a 51 y.o. (year old), female patient, seen today for interventional treatment. She  has a past surgical history that includes Cholecystectomy (2010); Polypectomy; dental work; Dilation and curettage of uterus (2010); IR NEPHROSTOMY PLACEMENT LEFT (12/14/2020); Nephrolithotomy (Left, 12/14/2020); Wisdom tooth extraction; Diagnostic laparoscopy (2004); Cystoscopy/ureteroscopy/holmium laser/stent placement (Right, 06/28/2021); Cystoscopy w/ ureteral stent removal (Right, 08/09/2021); Cystoscopy w/ retrogrades (Right, 08/09/2021); and Cystoscopy/ureteroscopy/holmium laser/stent placement (Right, 08/09/2021). Denise Burns has a current medication list which includes the following prescription(s): azelastine, cetirizine, fludrocortisone, flonase sensimist, hydrocodone-acetaminophen, [START ON 11/02/2021] hydrocodone-acetaminophen, hydrocortisone, meloxicam, urogesic-blue, oxycodone-acetaminophen, [START ON 10/13/2021] oxycodone-acetaminophen, pseudoephedrine, tizanidine, and hydrocodone-acetaminophen, and the following Facility-Administered Medications: fentanyl. Her primarily concern today is the Back Pain (Low and worse on the left)  Initial Vital Signs:  Pulse/HCG Rate: 84ECG Heart Rate: 77 Temp: (!) 97.3 F (36.3 C) Resp: 18 BP: (!) 143/95 SpO2: 95 %  BMI: Estimated body mass index is 30.9 kg/m as calculated from the following:   Height as of this encounter: '5\' 4"'$  (1.626 m).   Weight as of this encounter: 180 lb (81.6 kg).  Risk Assessment: Allergies: Reviewed. She is allergic to sulfacetamide sodium (acne), tramadol, lisinopril, and sulfa  antibiotics.  Allergy Precautions: None required Coagulopathies: Reviewed. None identified.  Blood-thinner therapy: None at this time Active Infection(s): Reviewed. None identified. Denise Burns is afebrile  Site Confirmation: [name] was asked to confirm the procedure and laterality before marking the site Procedure checklist: Completed Consent: Before the procedure and under the influence of no sedative(s), amnesic(s), or anxiolytics, the patient was informed of the treatment options, risks and possible complications. To fulfill our ethical and legal obligations, as recommended by the American Medical Association's Code of Ethics, I have informed the patient of my clinical impression; the nature and purpose of the treatment or procedure; the risks, benefits, and possible complications of the intervention; the alternatives, including doing nothing; the risk(s) and benefit(s) of the alternative treatment(s) or procedure(s); and the risk(s) and benefit(s) of doing nothing. The patient was provided information about the general risks and possible complications associated with the procedure. These may include, but are not limited to: failure to achieve desired goals, infection, bleeding, organ or nerve damage, allergic reactions, paralysis, and death. In addition, the patient was informed of those risks and complications associated to Spine-related procedures, such as failure to decrease pain; infection (i.e.: Meningitis, epidural or intraspinal abscess); bleeding (i.e.: epidural hematoma, subarachnoid hemorrhage, or any other type of intraspinal or peri-dural bleeding); organ or nerve damage (i.e.: Any type of peripheral nerve, nerve root, or spinal cord injury) with subsequent damage to sensory, motor, and/or autonomic systems, resulting in permanent pain, numbness, and/or weakness of one or several areas of the body; allergic reactions; (i.e.: anaphylactic reaction); and/or death. Furthermore, the  patient was informed of those risks and complications associated with the medications. These include, but are not limited to: allergic reactions (i.e.: anaphylactic or anaphylactoid reaction(s)); adrenal axis suppression; blood sugar elevation that in diabetics may result in ketoacidosis or comma; water retention that in patients with history of congestive heart failure may result in shortness of breath, pulmonary edema, and decompensation with resultant heart failure; weight gain; swelling or edema;  medication-induced neural toxicity; particulate matter embolism and blood vessel occlusion with resultant organ, and/or nervous system infarction; and/or aseptic necrosis of one or more joints. Finally, the patient was informed that Medicine is not an exact science; therefore, there is also the possibility of unforeseen or unpredictable risks and/or possible complications that may result in a catastrophic outcome. The patient indicated having understood very clearly. We have given the patient no guarantees and we have made no promises. Enough time was given to the patient to ask questions, all of which were answered to the patient's satisfaction. Ms. Detlefsen has indicated that she wanted to continue with the procedure. Attestation: I, the ordering provider, attest that I have discussed with the patient the benefits, risks, side-effects, alternatives, likelihood of achieving goals, and potential problems during recovery for the procedure that I have provided informed consent. Date   Time: 10/07/2021  8:03 AM  Pre-Procedure Preparation:  Monitoring: As per clinic protocol. Respiration, ETCO2, SpO2, BP, heart rate and rhythm monitor placed and checked for adequate function Safety Precautions: Patient was assessed for positional comfort and pressure points before starting the procedure. Time-out: I initiated and conducted the "Time-out" before starting the procedure, as per protocol. The patient was asked to  participate by confirming the accuracy of the "Time Out" information. Verification of the correct person, site, and procedure were performed and confirmed by me, the nursing staff, and the patient. "Time-out" conducted as per Joint Commission's Universal Protocol (UP.01.01.01). Time: 0907  Description of Procedure:          Laterality: Left Levels:  L2, L3, L4, L5, & S1 Medial Branch Level(s), at the L3-4 and L5-S1 lumbar facet joints. Safety Precautions: Aspiration looking for blood return was conducted prior to all injections. At no point did we inject any substances, as a needle was being advanced. Before injecting, the patient was told to immediately notify me if she was experiencing any new onset of "ringing in the ears, or metallic taste in the mouth". No attempts were made at seeking any paresthesias. Safe injection practices and needle disposal techniques used. Medications properly checked for expiration dates. SDV (single dose vial) medications used. After the completion of the procedure, all disposable equipment used was discarded in the proper designated medical waste containers. Local Anesthesia: Protocol guidelines were followed. The patient was positioned over the fluoroscopy table. The area was prepped in the usual manner. The time-out was completed. The target area was identified using fluoroscopy. A 12-in long, straight, sterile hemostat was used with fluoroscopic guidance to locate the targets for each level blocked. Once located, the skin was marked with an approved surgical skin marker. Once all sites were marked, the skin (epidermis, dermis, and hypodermis), as well as deeper tissues (fat, connective tissue and muscle) were infiltrated with a small amount of a short-acting local anesthetic, loaded on a 10cc syringe with a 25G, 1.5-in  Needle. An appropriate amount of time was allowed for local anesthetics to take effect before proceeding to the next step. Technical description of  process:  Radiofrequency Ablation (RFA) L2 Medial Branch Nerve RFA: The target area for the L2 medial branch is at the junction of the postero-lateral aspect of the superior articular process and the superior, posterior, and medial edge of the transverse process of L3. Under fluoroscopic guidance, a Radiofrequency needle was inserted until contact was made with os over the superior postero-lateral aspect of the pedicular shadow (target area). Sensory and motor testing was conducted to properly adjust the position of  the needle. Once satisfactory placement of the needle was achieved, the numbing solution was slowly injected after negative aspiration for blood. 2.0 mL of the nerve block solution was injected without difficulty or complication. After waiting for at least 3 minutes, the ablation was performed. Once completed, the needle was removed intact. L3 Medial Branch Nerve RFA: The target area for the L3 medial branch is at the junction of the postero-lateral aspect of the superior articular process and the superior, posterior, and medial edge of the transverse process of L4. Under fluoroscopic guidance, a Radiofrequency needle was inserted until contact was made with os over the superior postero-lateral aspect of the pedicular shadow (target area). Sensory and motor testing was conducted to properly adjust the position of the needle. Once satisfactory placement of the needle was achieved, the numbing solution was slowly injected after negative aspiration for blood. 2.0 mL of the nerve block solution was injected without difficulty or complication. After waiting for at least 3 minutes, the ablation was performed. Once completed, the needle was removed intact. L4 Medial Branch Nerve RFA: The target area for the L4 medial branch is at the junction of the postero-lateral aspect of the superior articular process and the superior, posterior, and medial edge of the transverse process of L5. Under fluoroscopic  guidance, a Radiofrequency needle was inserted until contact was made with os over the superior postero-lateral aspect of the pedicular shadow (target area). Sensory and motor testing was conducted to properly adjust the position of the needle. Once satisfactory placement of the needle was achieved, the numbing solution was slowly injected after negative aspiration for blood. 2.0 mL of the nerve block solution was injected without difficulty or complication. After waiting for at least 3 minutes, the ablation was performed. Once completed, the needle was removed intact. L5 Medial Branch Nerve RFA: The target area for the L5 medial branch is at the junction of the postero-lateral aspect of the superior articular process of S1 and the superior, posterior, and medial edge of the sacral ala. Under fluoroscopic guidance, a Radiofrequency needle was inserted until contact was made with os over the superior postero-lateral aspect of the pedicular shadow (target area). Sensory and motor testing was conducted to properly adjust the position of the needle. Once satisfactory placement of the needle was achieved, the numbing solution was slowly injected after negative aspiration for blood. 2.0 mL of the nerve block solution was injected without difficulty or complication. After waiting for at least 3 minutes, the ablation was performed. Once completed, the needle was removed intact. S1 Medial Branch Nerve RFA: The target area for the S1 medial branch is located inferior to the junction of the S1 superior articular process and the L5 inferior articular process, posterior, inferior, and lateral to the 6 o'clock position of the L5-S1 facet joint, just superior to the S1 posterior foramen. Under fluoroscopic guidance, the Radiofrequency needle was advanced until contact was made with os over the Target area. Sensory and motor testing was conducted to properly adjust the position of the needle. Once satisfactory placement of the  needle was achieved, the numbing solution was slowly injected after negative aspiration for blood. 2.0 mL of the nerve block solution was injected without difficulty or complication. After waiting for at least 3 minutes, the ablation was performed. Once completed, the needle was removed intact. Radiofrequency lesioning (ablation):  Radiofrequency Generator: Medtronic AccurianTM AG 1000 RF Generator Sensory Stimulation Parameters: 50 Hz was used to locate & identify the nerve, making sure that  the needle was positioned such that there was no sensory stimulation below 0.3 V or above 0.7 V. Motor Stimulation Parameters: 2 Hz was used to evaluate the motor component. Care was taken not to lesion any nerves that demonstrated motor stimulation of the lower extremities at an output of less than 2.5 times that of the sensory threshold, or a maximum of 2.0 V. Lesioning Technique Parameters: Standard Radiofrequency settings. (Not bipolar or pulsed.) Temperature Settings: 80 degrees C Lesioning time: 60 seconds Intra-operative Compliance: Compliant  Once the entire procedure was completed, the treated area was cleaned, making sure to leave some of the prepping solution back to take advantage of its long term bactericidal properties.    Illustration of the posterior view of the lumbar spine and the posterior neural structures. Laminae of L2 through S1 are labeled. DPRL5, dorsal primary ramus of L5; DPRS1, dorsal primary ramus of S1; DPR3, dorsal primary ramus of L3; FJ, facet (zygapophyseal) joint L3-L4; I, inferior articular process of L4; LB1, lateral branch of dorsal primary ramus of L1; IAB, inferior articular branches from L3 medial branch (supplies L4-L5 facet joint); IBP, intermediate branch plexus; MB3, medial branch of dorsal primary ramus of L3; NR3, third lumbar nerve root; S, superior articular process of L5; SAB, superior articular branches from L4 (supplies L4-5 facet joint also); TP3, transverse  process of L3.  Vitals:   10/07/21 0940 10/07/21 0950 10/07/21 1000 10/07/21 1011  BP: (!) 141/82 131/75 131/76 124/80  Pulse: 74     Resp: '13 15 14 16  '$ Temp:  (!) 97.2 F (36.2 C)  (!) 97.2 F (36.2 C)  TempSrc:      SpO2: 95% 96% 97% 97%  Weight:      Height:       Start Time: 0907 hrs. End Time: 0940 hrs.  Imaging Guidance (Spinal):          Type of Imaging Technique: Fluoroscopy Guidance (Spinal) Indication(s): Assistance in needle guidance and placement for procedures requiring needle placement in or near specific anatomical locations not easily accessible without such assistance. Exposure Time: Please see nurses notes. Contrast: None used. Fluoroscopic Guidance: I was personally present during the use of fluoroscopy. "Tunnel Vision Technique" used to obtain the best possible view of the target area. Parallax error corrected before commencing the procedure. "Direction-depth-direction" technique used to introduce the needle under continuous pulsed fluoroscopy. Once target was reached, antero-posterior, oblique, and lateral fluoroscopic projection used confirm needle placement in all planes. Images permanently stored in EMR. Interpretation: No contrast injected. I personally interpreted the imaging intraoperatively. Adequate needle placement confirmed in multiple planes. Permanent images saved into the patient's record.  Antibiotic Prophylaxis:   Anti-infectives (From admission, onward)    None      Indication(s): None identified  Post-operative Assessment:  Post-procedure Vital Signs:  Pulse/HCG Rate: 74 (NSR)76 Temp: (!) 97.2 F (36.2 C) Resp: 16 BP: 124/80 SpO2: 97 %  EBL: None  Complications: No immediate post-treatment complications observed by team, or reported by patient.  Note: The patient tolerated the entire procedure well. A repeat set of vitals were taken after the procedure and the patient was kept under observation following institutional policy, for  this type of procedure. Post-procedural neurological assessment was performed, showing return to baseline, prior to discharge. The patient was provided with post-procedure discharge instructions, including a section on how to identify potential problems. Should any problems arise concerning this procedure, the patient was given instructions to immediately contact us, at any time, without  hesitation. In any case, we plan to contact the patient by telephone for a follow-up status report regarding this interventional procedure.  Comments:  No additional relevant information.  Plan of Care  Orders:  Orders Placed This Encounter  Procedures   Radiofrequency,Lumbar    Scheduling Instructions:     Side(s): Left-sided     Level: L3-4, L4-5, & L5-S1 Facets (L2, L3, L4, L5, & S1 Medial Branch Nerves)     Sedation: With Sedation.     Timeframe: Today    Order Specific Question:   Where will this procedure be performed?    Answer:   [facility]   [facility] C-ARM 1-60 MIN NO REPORT    Intraoperative interpretation by procedural physician at [facility].    Standing Status:   Standing    Number of Occurrences:   1    Order Specific Question:   Reason for exam:    Answer:   Assistance in needle guidance and placement for procedures requiring needle placement in or near specific anatomical locations not easily accessible without such assistance.   Informed Consent Details: Physician/Practitioner Attestation; Transcribe to consent form and obtain patient signature    Nursing Order: Transcribe to consent form and obtain patient signature. Note: Always confirm laterality of pain with [name], before procedure.    Order Specific Question:   Physician/Practitioner attestation of informed consent for procedure/surgical case    Answer:   I, the physician/practitioner, attest that I have discussed with the patient the benefits, risks, side effects, alternatives, likelihood of  achieving goals and potential problems during recovery for the procedure that I have provided informed consent.    Order Specific Question:   Procedure    Answer:   Lumbar Facet Radiofrequency Ablation    Order Specific Question:   Physician/Practitioner performing the procedure    Answer:   Wilmetta Speiser A. Dossie Arbour, MD    Order Specific Question:   Indication/Reason    Answer:   Low Back Pain, with our without leg pain, due to Facet Joint Arthralgia (Joint Pain) known as Lumbar Facet Syndrome, secondary to Lumbar, and/or Lumbosacral Spondylosis (Arthritis of the Spine), without myelopathy or radiculopathy (Nerve Damage).   Provide equipment / supplies at bedside    "Radiofrequency Tray"; Large hemostat (x1); Small hemostat (x1); Towels (x8); 4x4 sterile sponge pack (x1) Needle type: Teflon-coated Radiofrequency Needle (Disposable   single use) Size: Long Quantity: 5    Standing Status:   Standing    Number of Occurrences:   1    Order Specific Question:   Specify    Answer:   Radiofrequency Tray   Nursing Instructions:    Please complete this patient's postprocedure evaluation.    Scheduling Instructions:     Please complete this patient's postprocedure evaluation.   Chronic Opioid Analgesic:  Hydrocodone/APAP 7.5/325 1 tablet p.o. 3 times daily (22.5 mg/day of hydrocodone)  MME/day: 22.5 mg/day.   Medications ordered for procedure: Meds ordered this encounter  Medications   lidocaine (XYLOCAINE) 2 % (with pres) injection 400 mg   lactated ringers infusion 1,000 mL   midazolam (VERSED) 5 MG/5ML injection 0.5-2 mg    Make sure Flumazenil is available in the pyxis when using this medication. If oversedation occurs, administer 0.2 mg IV over 15 sec. If after 45 sec no response, administer 0.2 mg again over 1 min; may repeat at 1 min intervals; not to exceed 4 doses (1 mg)   fentaNYL (SUBLIMAZE) injection 25-50 mcg  Make sure Narcan is available in the pyxis when using this medication. In  the event of respiratory depression (RR< 8/min): Titrate NARCAN (naloxone) in increments of 0.1 to 0.2 mg IV at 2-3 minute intervals, until desired degree of reversal.   triamcinolone acetonide (KENALOG-40) injection 40 mg   ropivacaine (PF) 2 mg/mL (0.2%) (NAROPIN) injection 9 mL   oxyCODONE-acetaminophen (PERCOCET) 5-325 MG tablet    Sig: Take 1 tablet by mouth every 8 (eight) hours as needed for up to 7 days for severe pain. Must last 7 days.    Dispense:  21 tablet    Refill:  0    For acute post-operative pain. Not to be refilled.  Must last 7 days.   oxyCODONE-acetaminophen (PERCOCET) 5-325 MG tablet    Sig: Take 1 tablet by mouth every 8 (eight) hours as needed for up to 7 days for severe pain. Must last 7 days.    Dispense:  21 tablet    Refill:  0    For acute post-operative pain. Not to be refilled.  Must last 7 days.   Medications administered: We administered lidocaine, lactated ringers, midazolam, fentaNYL, triamcinolone acetonide, and ropivacaine (PF) 2 mg/mL (0.2%).  See the medical record for exact dosing, route, and time of administration.  Follow-up plan:   Return in about 6 weeks (around 11/18/2021) for Proc-day (T,Th), (VV), (PPE).       Interventional Therapies  Risk   Complexity Considerations:   Estimated body mass index is 30.9 kg/m as calculated from the following:   Height as of this encounter: '5\' 4"'$  (1.626 m).   Weight as of this encounter: 180 lb (81.6 kg). WNL   Planned   Pending:      Under consideration:   Diagnostic L2-3 L-FCT BLK #1  Diagnostic bilateral SI joint Blk  Possible bilateral SI joint RFA  Diagnostic bilateral L5 TFESI  Diagnostic left-sided L2 TFESI    Completed:   Therapeutic bilateral lumbar facet block x3 (09/08/2020) (4-0) (100/100/60/60)  Therapeutic right lumbar facet RFA x2 (09/09/2021) (4-2) (100/100/90/>75)  Therapeutic left lumbar facet RFA x1 (02/04/2020) (4-0) (100/100/90/>75)  Therapeutic right IA hip inj. x2 (08/20/2020)  (5-1) (100/100/80/80)  Therapeutic left IA hip inj. x1 (08/20/2020) (5-1) (100/100/80/80)    Therapeutic   Palliative (PRN) options:   Palliative bilateral lumbar facet MBB #4  Palliative right lumbar facet RFA #2 (last done 01/07/2020) Palliative left lumbar facet RFA #2 (last done 02/04/2020)    Recent Visits Date Type Provider Dept  09/09/21 Procedure visit Milinda Pointer, MD [facility]  08/30/21 Office Visit Milinda Pointer, MD [facility]  07/13/21 Office Visit Milinda Pointer, MD [facility]  Showing recent visits within past 90 days and meeting all other requirements Today's Visits Date Type Provider Dept  10/07/21 Procedure visit Milinda Pointer, MD [facility]  Showing today's visits and meeting all other requirements Future Appointments Date Type Provider Dept  11/18/21 Appointment Milinda Pointer, MD [facility]  11/29/21 Appointment Milinda Pointer, MD [facility]  Showing future appointments within next 90 days and meeting all other requirements  Disposition: Discharge home  Discharge (Date   Time): 10/07/2021; 1014 hrs.   Primary Care Physician: [name], NP Location: [facility] Note by: [name], MD Date: 10/07/2021; Time: 10:43 AM  Disclaimer:  Medicine is not an Chief Strategy Officer. The only guarantee in medicine is that nothing is guaranteed. It is important to note that the decision  to proceed with this intervention was based on the information collected from the patient. The Data and conclusions were drawn from the patient's questionnaire, the interview, and the physical examination. Because the information was provided in large part by the patient, it cannot be guaranteed that it has not been purposely or unconsciously manipulated. Every effort has been made to obtain as much relevant data as possible for this evaluation. It is important to note that  the conclusions that lead to this procedure are derived in large part from the available data. Always take into account that the treatment will also be dependent on availability of resources and existing treatment guidelines, considered by other Pain Management Practitioners as being common knowledge and practice, at the time of the intervention. For Medico-Legal purposes, it is also important to point out that variation in procedural techniques and pharmacological choices are the acceptable norm. The indications, contraindications, technique, and results of the above procedure should only be interpreted and judged by a Board-Certified Interventional Pain Specialist with extensive familiarity and expertise in the same exact procedure and technique.

## 2021-10-07 ENCOUNTER — Ambulatory Visit: Payer: BC Managed Care – PPO | Admitting: Pain Medicine

## 2021-10-07 ENCOUNTER — Other Ambulatory Visit: Payer: Self-pay

## 2021-10-07 ENCOUNTER — Encounter: Payer: Self-pay | Admitting: Pain Medicine

## 2021-10-07 ENCOUNTER — Ambulatory Visit
Admission: RE | Admit: 2021-10-07 | Discharge: 2021-10-07 | Disposition: A | Discharge status: Home / Self Care | Payer: BC Managed Care – PPO | Source: Ambulatory Visit | Attending: Pain Medicine | Admitting: Pain Medicine

## 2021-10-07 VITALS — BP 124/80 | HR 74 | Temp 97.2°F | Resp 16 | Ht 64.0 in | Wt 180.0 lb

## 2021-10-07 DIAGNOSIS — M5137 Other intervertebral disc degeneration, lumbosacral region: Secondary | ICD-10-CM

## 2021-10-07 DIAGNOSIS — Z5189 Encounter for other specified aftercare: Secondary | ICD-10-CM

## 2021-10-07 DIAGNOSIS — G8929 Other chronic pain: Secondary | ICD-10-CM | POA: Insufficient documentation

## 2021-10-07 DIAGNOSIS — M4310 Spondylolisthesis, site unspecified: Secondary | ICD-10-CM | POA: Diagnosis not present

## 2021-10-07 DIAGNOSIS — M4317 Spondylolisthesis, lumbosacral region: Secondary | ICD-10-CM | POA: Diagnosis not present

## 2021-10-07 DIAGNOSIS — M47817 Spondylosis without myelopathy or radiculopathy, lumbosacral region: Secondary | ICD-10-CM

## 2021-10-07 DIAGNOSIS — M47816 Spondylosis without myelopathy or radiculopathy, lumbar region: Secondary | ICD-10-CM

## 2021-10-07 DIAGNOSIS — M545 Low back pain, unspecified: Secondary | ICD-10-CM | POA: Insufficient documentation

## 2021-10-07 DIAGNOSIS — M4306 Spondylolysis, lumbar region: Secondary | ICD-10-CM | POA: Insufficient documentation

## 2021-10-07 DIAGNOSIS — M51379 Other intervertebral disc degeneration, lumbosacral region without mention of lumbar back pain or lower extremity pain: Secondary | ICD-10-CM

## 2021-10-07 MED ORDER — TRIAMCINOLONE ACETONIDE 40 MG/ML IJ SUSP
INTRAMUSCULAR | Status: AC
  Filled 2021-10-07: qty 1

## 2021-10-07 MED ORDER — OXYCODONE-ACETAMINOPHEN 5-325 MG PO TABS
1.0000 | ORAL_TABLET | Freq: Three times a day (TID) | ORAL | 0 refills | Status: DC | PRN
  Filled 2021-10-14: qty 21, 7d supply, fill #0

## 2021-10-07 MED ORDER — LIDOCAINE HCL (PF) 2 % IJ SOLN
INTRAMUSCULAR | Status: AC
  Filled 2021-10-07: qty 10

## 2021-10-07 MED ORDER — MIDAZOLAM HCL 5 MG/5ML IJ SOLN
0.5000 mg | Freq: Once | INTRAMUSCULAR | Status: AC
  Administered 2021-10-07: 09:00:00 2 mg via INTRAVENOUS
  Administered 2021-10-07: 09:00:00 1.5 mg via INTRAVENOUS

## 2021-10-07 MED ORDER — ROPIVACAINE HCL 2 MG/ML IJ SOLN
9.0000 mL | Freq: Once | INTRAMUSCULAR | Status: AC
  Administered 2021-10-07: 09:00:00 9 mL via PERINEURAL

## 2021-10-07 MED ORDER — MIDAZOLAM HCL 5 MG/5ML IJ SOLN
INTRAMUSCULAR | Status: AC
  Filled 2021-10-07: qty 5

## 2021-10-07 MED ORDER — ROPIVACAINE HCL 2 MG/ML IJ SOLN
INTRAMUSCULAR | Status: AC
  Filled 2021-10-07: qty 20

## 2021-10-07 MED ORDER — FENTANYL CITRATE (PF) 100 MCG/2ML IJ SOLN
25.0000 ug | INTRAMUSCULAR | Status: DC | PRN
  Administered 2021-10-07: 10:00:00 50 ug via INTRAVENOUS

## 2021-10-07 MED ORDER — LIDOCAINE HCL 2 % IJ SOLN
20.0000 mL | Freq: Once | INTRAMUSCULAR | Status: AC
  Administered 2021-10-07: 09:00:00 400 mg

## 2021-10-07 MED ORDER — TRIAMCINOLONE ACETONIDE 40 MG/ML IJ SUSP
40.0000 mg | Freq: Once | INTRAMUSCULAR | Status: AC
  Administered 2021-10-07: 09:00:00 40 mg

## 2021-10-07 MED ORDER — LACTATED RINGERS IV SOLN
1000.0000 mL | Freq: Once | INTRAVENOUS | Status: AC
  Administered 2021-10-07: 09:00:00 1000 mL via INTRAVENOUS

## 2021-10-07 MED ORDER — OXYCODONE-ACETAMINOPHEN 5-325 MG PO TABS
1.0000 | ORAL_TABLET | Freq: Three times a day (TID) | ORAL | 0 refills | Status: DC | PRN
  Filled 2021-10-07: qty 21, 7d supply, fill #0

## 2021-10-07 NOTE — Progress Notes (Signed)
Safety precautions to be maintained throughout the outpatient stay will include: orient to surroundings, keep bed in low position, maintain call bell within reach at all times, provide assistance with transfer out of bed and ambulation.  

## 2021-10-07 NOTE — Patient Instructions (Signed)

## 2021-10-08 ENCOUNTER — Telehealth: Payer: Self-pay

## 2021-10-08 NOTE — Telephone Encounter (Signed)
Post procedure follow up call. Left message to call office for any questions or concerns.  ?

## 2021-10-14 ENCOUNTER — Other Ambulatory Visit: Payer: Self-pay

## 2021-10-20 ENCOUNTER — Other Ambulatory Visit: Payer: Self-pay

## 2021-10-21 ENCOUNTER — Other Ambulatory Visit: Payer: Self-pay

## 2021-10-26 ENCOUNTER — Telehealth: Payer: Self-pay | Admitting: Pain Medicine

## 2021-10-26 NOTE — Telephone Encounter (Signed)
Discussed with Dr. Dossie Arbour, [facility] to schedule eval. [name] will call patient. ?

## 2021-10-26 NOTE — Telephone Encounter (Signed)
Having pain in both hips, believes it may be related to the trochanteric bursa. Requesting eval appt to discuss options. Will discuss with Dr. Dossie Arbour to ask if he will do this before eval of RFA done on [id number]. ?

## 2021-10-27 ENCOUNTER — Encounter: Payer: Self-pay | Admitting: Pain Medicine

## 2021-10-27 ENCOUNTER — Other Ambulatory Visit: Payer: Self-pay

## 2021-10-27 ENCOUNTER — Ambulatory Visit: Payer: BC Managed Care – PPO | Attending: Pain Medicine | Admitting: Pain Medicine

## 2021-10-27 VITALS — BP 157/99 | HR 105 | Temp 97.1°F | Ht 64.0 in | Wt 180.0 lb

## 2021-10-27 DIAGNOSIS — M545 Low back pain, unspecified: Secondary | ICD-10-CM | POA: Diagnosis not present

## 2021-10-27 DIAGNOSIS — M47816 Spondylosis without myelopathy or radiculopathy, lumbar region: Secondary | ICD-10-CM | POA: Diagnosis not present

## 2021-10-27 DIAGNOSIS — M25552 Pain in left hip: Secondary | ICD-10-CM | POA: Diagnosis not present

## 2021-10-27 DIAGNOSIS — G8929 Other chronic pain: Secondary | ICD-10-CM | POA: Insufficient documentation

## 2021-10-27 DIAGNOSIS — M25551 Pain in right hip: Secondary | ICD-10-CM | POA: Insufficient documentation

## 2021-10-27 DIAGNOSIS — Z79891 Long term (current) use of opiate analgesic: Secondary | ICD-10-CM | POA: Insufficient documentation

## 2021-10-27 DIAGNOSIS — G8918 Other acute postprocedural pain: Secondary | ICD-10-CM | POA: Insufficient documentation

## 2021-10-27 DIAGNOSIS — Z79899 Other long term (current) drug therapy: Secondary | ICD-10-CM | POA: Diagnosis not present

## 2021-10-27 DIAGNOSIS — G894 Chronic pain syndrome: Secondary | ICD-10-CM | POA: Diagnosis not present

## 2021-10-27 DIAGNOSIS — M533 Sacrococcygeal disorders, not elsewhere classified: Secondary | ICD-10-CM | POA: Insufficient documentation

## 2021-10-27 MED ORDER — OXYCODONE-ACETAMINOPHEN 5-325 MG PO TABS
1.0000 | ORAL_TABLET | Freq: Three times a day (TID) | ORAL | 0 refills | Status: DC | PRN
  Filled 2021-10-27: qty 21, 7d supply, fill #0

## 2021-10-27 MED ORDER — HYDROCODONE-ACETAMINOPHEN 7.5-325 MG PO TABS
1.0000 | ORAL_TABLET | Freq: Three times a day (TID) | ORAL | 0 refills | Status: DC | PRN
  Filled 2021-12-03 (×2): qty 90, 30d supply, fill #0

## 2021-10-27 NOTE — Progress Notes (Signed)
Safety precautions to be maintained throughout the outpatient stay will include: orient to surroundings, keep bed in low position, maintain call bell within reach at all times, provide assistance with transfer out of bed and ambulation.  

## 2021-10-27 NOTE — Progress Notes (Signed)
PROVIDER NOTE: Information contained herein reflects review and annotations entered in association with encounter. Interpretation of such information and data should be left to medically-trained personnel. Information provided to patient can be located elsewhere in the medical record under "Patient Instructions". Document created using STT-dictation technology, any transcriptional errors that may result from process are unintentional.  ?  ?Patient: Denise Burns  Service Category: E/M  Provider: [name], MD  ?DOB: 1971-04-16  DOS: 10/27/2021  Specialty: Interventional Pain Management  ?MRN: [id number]  Setting: Ambulatory outpatient  PCP: [name], NP  ?Type: Established Patient    Referring Provider: [name], NP  ?Location: Office  Delivery: Face-to-face    ? ?HPI  ?Denise Burns, a 51 y.o. year old female, is here today because of her Acute postoperative pain [G89.18]. Denise Burns's primary complain today is Back Pain (left) ?Last encounter: My last encounter with her was on 10/26/2021. ?Pertinent problems: Ms. Grieshop has Chronic midline low back pain with bilateral sciatica; Chronic low back pain (1ry area of Pain) (Bilateral) (R>L) w/o sciatica; Chronic sacroiliac joint pain (2ry area of Pain) (Right); Chronic pain syndrome; Abnormal MRI, lumbar spine (03/22/2018); Lumbar facet hypertrophy (Multilevel) (Bilateral); Lumbar facet syndrome (Bilateral); DDD (degenerative disc disease), lumbosacral; Spondylolisthesis (8 mm) at L5-S1 level; Lumbar Grade 1 Retrolisthesis (2 mm) of L2/L3; L5-S1 pars defect w/ spondylolisthesis (Bilateral); Lumbar pars defect (L5-S1) (Bilateral); Lumbar foraminal stenosis (Left: L2-3) (Bilateral: L5-S1); Osteoarthritis of sacroiliac joint (Bilateral); Chronic musculoskeletal pain; Neurogenic pain; Osteoarthritis of facet joint of lumbar spine; Greater trochanteric bursitis (Bilateral); Lumbar spondylosis; Spondylosis without myelopathy or  radiculopathy, lumbosacral region; Chronic hip pain (Right); Osteoarthritis of hip (Right); Lumbar radiculitis (L1/L2) (Right); Chronic groin pain (Right); Chronic hip pain (Bilateral); Enthesopathy hip region (Left); Osteoarthritis of hips (Bilateral); Kissing spine syndrome; Chronic low back pain (Midline) w/o sciatica; and Trigger point with back pain (PSIS area) (Right) on their pertinent problem list. ?Pain Assessment: Severity of Chronic pain is reported as a 7 /10. Location: Back (left hip) Lower/pain radiaities down to her left hip. Onset: More than a month ago. Quality: Aching, Constant. Timing: Constant. Modifying factor(s): medications, heat and ice, biofreeze. ?Vitals:  height is '5\' 4"'$  (1.626 m) and weight is 180 lb (81.6 kg). Her temperature is 97.1 ?F (36.2 ?C) (abnormal). Her blood pressure is 157/99 (abnormal) and her pulse is 105 (abnormal). Her oxygen saturation is 100%.  ? ?Reason for encounter: evaluation of worsening, or previously known (established) problem.  The patient requested to come in today for evaluation of increased pain on the left lower back.  As it turns out she had a radiofrequency ablation of the lumbar facets on the left side on 10/07/2021.  It has been less than 6 weeks and typically he will take at least that long for the radiofrequency ablation discomfort to go down.  She has run out of her postprocedure pain medication and therefore today I have given her a refill to help her with some of that discomfort until the postprocedure discomfort subsides.  Aside from this, she is also having some discomfort on the area of the left hip but pain is on the lateral aspect of the hip where she has a known chronic recurrent trochanteric bursitis.  Hopefully with some of the pain medication that we are providing her today that this will get under control, but if it does not, I have encouraged her to give [place] a call and we can go in and do a left  trochanteric bursal injection to again get  under control.  Physical exam today was positive for left lumbar facet arthralgia, probably related to the radiofrequency ablation, on hyperextension on rotation provocative maneuver.  Patrick maneuver was negative bilaterally for SI joint or hip joint arthralgia. ? ? The patient indicates doing well with the current medication regimen. No adverse reactions or side effects reported to the medications.  ? ?Post-procedure evaluation  ?  Type: Lumbar Facet, Medial Branch Radiofrequency Ablation (RFA) #2  ?Laterality: Left  ?Level: L2, L3, L4, L5, & S1 Medial Branch Level(s). These levels will denervate the L3-4 and L5-S1 lumbar facet joints.  ?Imaging: Fluoroscopic guidance ?Anesthesia: Local anesthesia (1-2% Lidocaine) ?Anxiolysis: IV Versed         ?Sedation: Moderate conscious sedation. ?DOS: 10/07/2021  ?Performed by: [name], MD ? ?Purpose: Therapeutic/Palliative ?Indications: Low back pain severe enough to impact quality of life or function. ? ?Pain Score: ?Pre-procedure: 4 /10 ?Post-procedure: 0-No pain/10 ? ?   ?Effectiveness:  ?Initial hour after procedure: 100 %. ?Subsequent 4-6 hours post-procedure: 100 %. ?Analgesia past initial 6 hours: 80 % (had relief for about week, pain is worse than before). ?Ongoing improvement:  ?Analgesic: She is still within the 6-week postprocedure.  When the pain is usually worse. ? ?Pharmacotherapy Assessment  ?Analgesic: Hydrocodone/APAP 7.5/325 1 tablet p.o. 3 times daily (22.5 mg/day of hydrocodone)  ?MME/day: 22.5 mg/day.  ? ?Monitoring: ?[place] PMP: PDMP not reviewed this encounter.       ?Pharmacotherapy: No side-effects or adverse reactions reported. ?Compliance: No problems identified. ?Effectiveness: Clinically acceptable. ? ?Chauncey Fischer, RN  10/27/2021 10:51 AM  Sign when Signing Visit ?Safety precautions to be maintained throughout the outpatient stay will include: orient to surroundings, keep bed in low position, maintain call bell within reach at all  times, provide assistance with transfer out of bed and ambulation.  ?   UDS:  ?Summary  ?Date Value Ref Range Status  ?01/27/2021 Note  Final  ?  Comment:  ?  ==================================================================== ?[employer] Select 13 (MW) ?==================================================================== ?Test                             Result       Flag       Units ? ?Drug Present and Declared for Prescription Verification ?  Hydrocodone                    544          EXPECTED   ng/mg creat ?  Hydromorphone                  282          EXPECTED   ng/mg creat ?  Dihydrocodeine                 100          EXPECTED   ng/mg creat ?  Norhydrocodone                 735          EXPECTED   ng/mg creat ?   Sources of hydrocodone include scheduled prescription medications. ?   Hydromorphone, dihydrocodeine and norhydrocodone are expected ?   metabolites of hydrocodone. Hydromorphone and dihydrocodeine are ?   also available as scheduled prescription medications. ? ?==================================================================== ?Test  Result    Flag   Units      Ref Range ?  Creatinine              171              mg/dL      >=20 ?==================================================================== ?Declared Medications: ? The flagging and interpretation on this report are based on the ? following declared medications.  Unexpected results may arise from ? inaccuracies in the declared medications. ? ? **Note: The testing scope of this panel includes these medications: ? ? Hydrocodone (Norco) ? ? **Note: The testing scope of this panel does not include the ? following reported medications: ? ? Acetaminophen (Norco) ? Azelastine ? Fludrocortisone (Florinef) ? Fluticasone (Flonase) ? Hydrocortisone (Cortef) ? Meloxicam ? Polyethylene Glycol (Plenvu) ? Promethazine ? Pseudoephedrine (Sudafed) ? Tizanidine ?==================================================================== ?For  clinical consultation, please call [phone]. ?==================================================================== ?  ?  ? ?ROS  ?Constitutional: Denies any fever or chills ?Gastrointestinal: No reported h

## 2021-11-02 ENCOUNTER — Other Ambulatory Visit: Payer: Self-pay

## 2021-11-03 ENCOUNTER — Other Ambulatory Visit: Payer: Self-pay

## 2021-11-08 ENCOUNTER — Encounter: Payer: Self-pay | Admitting: Nurse Practitioner

## 2021-11-08 ENCOUNTER — Other Ambulatory Visit: Payer: Self-pay

## 2021-11-08 ENCOUNTER — Ambulatory Visit (INDEPENDENT_AMBULATORY_CARE_PROVIDER_SITE_OTHER): Payer: BC Managed Care – PPO | Admitting: Nurse Practitioner

## 2021-11-08 VITALS — BP 130/76 | HR 97 | Temp 98.1°F | Ht 64.0 in | Wt 186.0 lb

## 2021-11-08 DIAGNOSIS — R319 Hematuria, unspecified: Secondary | ICD-10-CM | POA: Diagnosis not present

## 2021-11-08 DIAGNOSIS — N390 Urinary tract infection, site not specified: Secondary | ICD-10-CM | POA: Diagnosis not present

## 2021-11-08 LAB — POCT URINALYSIS DIP (CLINITEK)
Bilirubin, UA: NEGATIVE
Glucose, UA: NEGATIVE mg/dL
Nitrite, UA: NEGATIVE
POC PROTEIN,UA: NEGATIVE
Spec Grav, UA: >=1.03 — AB (ref 1.010–1.025)
Urobilinogen, UA: 0.2 E.U./dL
pH, UA: 6 (ref 5.0–8.0)

## 2021-11-08 MED ORDER — NITROFURANTOIN MONOHYD MACRO 100 MG PO CAPS
100.0000 mg | ORAL_CAPSULE | Freq: Two times a day (BID) | ORAL | 0 refills | Status: DC
  Filled 2021-11-08: qty 14, 7d supply, fill #0

## 2021-11-08 NOTE — Progress Notes (Signed)
Established patient visit ? ? ?Patient: Denise Burns   DOB: 05-Apr-1971   51 y.o. Female  MRN: [id number] ?Visit Date: 11/08/2021 ? ?Chief Complaint  ?Patient presents with  ? Urinary Tract Infection  ? ?Subjective  ?  ?The patient had surgery to remove large staghorn renal stones in 11/2020 and again 08/2021. She does have history of frequent UTIs and renal stones.  ? ?Urinary Tract Infection  ?This is a new problem. The current episode started in the past 7 days. The problem occurs every urination. The problem has been gradually worsening. There has been no fever. She is Sexually active. There is A history of pyelonephritis. Associated symptoms include frequency, hematuria and urgency. Pertinent negatives include no chills, discharge, flank pain, hesitancy, nausea or vomiting. Treatments tried: uribel - turns urine blud. The treatment provided mild relief. Her past medical history is significant for kidney stones, recurrent UTIs and a urological procedure.   ? ? ?Medications: ?Outpatient Medications Prior to Visit  ?Medication Sig  ? azelastine (ASTELIN) 0.1 % nasal spray Place 1 spray into both nostrils 2 (two) times daily.  ? fludrocortisone (FLORINEF) 0.1 MG tablet TAKE 1/2 TABLET BY MOUTH DAILY  ? fluticasone (FLONASE SENSIMIST) 27.5 MCG/SPRAY nasal spray Place 1 spray into the nose in the morning and at bedtime.  ? HYDROcodone-acetaminophen (NORCO) 7.5-325 MG tablet Take 1 tablet by mouth every 8 (eight) hours as needed for severe pain. Must last 30 days.  ? [START ON 12/02/2021] HYDROcodone-acetaminophen (NORCO) 7.5-325 MG tablet Take 1 tablet by mouth every 8 (eight) hours as needed for severe pain. Must last 30 days.  ? hydrocortisone (CORTEF) 10 MG tablet Take 1 and 1/2 tablets by mouth daily with breakfast and 1/2 tablet daily in the evening  ? meloxicam (MOBIC) 15 MG tablet TAKE 1 TABLET BY MOUTH DAILY  ? Methen-Hyosc-Meth Blue-Na Phos (UROGESIC-BLUE) 81.6 MG TABS Take 1 tablet (81.6 mg total) by mouth 4  (four) times daily as needed.  ? pseudoephedrine (SUDAFED) 30 MG tablet Take 30 mg by mouth every 12 (twelve) hours as needed for congestion.   ? tiZANidine (ZANAFLEX) 4 MG tablet Take 1 tablet (4 mg total) by mouth every 8 (eight) hours as needed for muscle spasms.  ? HYDROcodone-acetaminophen (NORCO) 7.5-325 MG tablet Take 1 tablet by mouth every 8 (eight) hours as needed for severe pain. Must last 30 days.  ? [DISCONTINUED] cetirizine (ZYRTEC) 10 MG tablet Take 10 mg by mouth daily. (Patient not taking: Reported on 11/08/2021)  ? ?No facility-administered medications prior to visit.  ? ? ?Review of Systems  ?Constitutional:  Negative for activity change, appetite change, chills, fatigue and fever.  ?HENT:  Negative for congestion, postnasal drip, rhinorrhea, sinus pressure, sinus pain, sneezing and sore throat.   ?Eyes: Negative.   ?Respiratory:  Negative for cough, chest tightness, shortness of breath and wheezing.   ?Cardiovascular:  Negative for chest pain and palpitations.  ?Gastrointestinal:  Negative for abdominal pain, constipation, diarrhea, nausea and vomiting.  ?Endocrine: Negative for cold intolerance, heat intolerance, polydipsia and polyuria.  ?Genitourinary:  Positive for dysuria, frequency, hematuria and urgency. Negative for dyspareunia, flank pain and hesitancy.  ?Musculoskeletal:  Positive for arthralgias and back pain. Negative for myalgias.  ?Skin:  Negative for rash.  ?Allergic/Immunologic: Negative for environmental allergies.  ?Neurological:  Negative for dizziness, weakness and headaches.  ?Hematological:  Negative for adenopathy.  ?Psychiatric/Behavioral:  The patient is not nervous/anxious.   ? ? Objective  ?  ? ?Today's Vitals  ?  11/08/21 1337  ?BP: 130/76  ?Pulse: 97  ?Temp: 98.1 ?F (36.7 ?C)  ?SpO2: 99%  ?Weight: 186 lb (84.4 kg)  ?Height: '5\' 4"'$  (1.626 m)  ? ?Body mass index is 31.93 kg/m?.  ? ?Physical Exam ?Vitals and nursing note reviewed.  ?Constitutional:   ?   Appearance: Normal  appearance. She is well-developed.  ?HENT:  ?   Head: Normocephalic.  ?Eyes:  ?   Pupils: Pupils are equal, round, and reactive to light.  ?Cardiovascular:  ?   Rate and Rhythm: Normal rate and regular rhythm.  ?   Pulses: Normal pulses.  ?   Heart sounds: Normal heart sounds.  ?Pulmonary:  ?   Effort: Pulmonary effort is normal.  ?   Breath sounds: Normal breath sounds.  ?Abdominal:  ?   Palpations: Abdomen is soft.  ?Genitourinary: ?   Comments: Urine sample positive for trace WBC, trace protein, and trace blood. It is green in color.  ?Musculoskeletal:     ?   General: Normal range of motion.  ?   Cervical back: Normal range of motion and neck supple.  ?Lymphadenopathy:  ?   Cervical: No cervical adenopathy.  ?Skin: ?   General: Skin is warm and dry.  ?   Capillary Refill: Capillary refill takes less than 2 seconds.  ?Neurological:  ?   General: No focal deficit present.  ?   Mental Status: She is alert and oriented to person, place, and time.  ?Psychiatric:     ?   Mood and Affect: Mood normal.     ?   Behavior: Behavior normal.     ?   Thought Content: Thought content normal.     ?   Judgment: Judgment normal.  ?  ?  ?   ? Assessment & Plan  ?  ? ?1. Urinary tract infection with hematuria, site unspecified ?Start macrobid '100mg'$  taice daily for next 7 days. Send urine for culture and sensitivity and adjust antibiotics as indicated.  ?- nitrofurantoin, macrocrystal-monohydrate, (MACROBID) 100 MG capsule; Take 1 capsule (100 mg total) by mouth 2 (two) times daily.  Dispense: 14 capsule; Refill: 0 ?- POCT URINALYSIS DIP (CLINITEK) ?- Urine Culture; Future ?- Urine Culture  ? ?Return for prn worsening or persistent symptoms.  ?   ? ? ? ?[name], NP  ?[facility] ?[phone] (phone) ?[phone] (fax) ? ?[facility] ?

## 2021-11-10 LAB — URINE CULTURE

## 2021-11-10 NOTE — Progress Notes (Signed)
Please let the patient know that urine sample showing very mild infection. She should finish prescribed antibiotics. If symptoms worsen or do not improve, she may consider contacting her urologist for further evaluation. Thanks so much.   -HB

## 2021-11-18 ENCOUNTER — Telehealth: Payer: BC Managed Care – PPO | Admitting: Pain Medicine

## 2021-11-18 ENCOUNTER — Telehealth: Payer: Self-pay | Admitting: *Deleted

## 2021-11-18 NOTE — Telephone Encounter (Signed)
Attempted to call for pre appointment review of allergies/meds. Message left. 

## 2021-11-21 ENCOUNTER — Ambulatory Visit: Payer: PRIVATE HEALTH INSURANCE

## 2021-11-21 DIAGNOSIS — M26623 Arthralgia of bilateral temporomandibular joint: Secondary | ICD-10-CM

## 2021-11-21 NOTE — Progress Notes (Signed)
Patient: Denise Burns  Service Category: E/M  Provider: [name], MD  ?DOB: 31-Mar-1971  DOS: 11/22/2021  Location: Office  ?MRN: [id number]  Setting: Ambulatory outpatient  Referring Provider: [name], NP  ?Type: Established Patient  Specialty: Interventional Pain Management  PCP: [name], NP  ?Location: Remote location  Delivery: TeleHealth    ? ?Virtual Encounter - Pain Management ?PROVIDER NOTE: Information contained herein reflects review and annotations entered in association with encounter. Interpretation of such information and data should be left to medically-trained personnel. Information provided to patient can be located elsewhere in the medical record under "Patient Instructions". Document created using STT-dictation technology, any transcriptional errors that may result from process are unintentional.  ?  ?Contact & Pharmacy ?Preferred: [phone] ?Home: [phone] (home) ?Mobile: [phone] (mobile) ?E-mail: [email or web address]  ?[facility] ?[address] ?[place] [address] ?Phone: [phone] Fax: [phone] ? ?[place], [address] ?[address] ?[address] ?[place] [address] ?Phone: [phone] Fax: [phone] ?  ?Pre-screening  ?[name] offered "in-person" vs "virtual" encounter. She indicated preferring virtual for this encounter.  ? ?Reason ?COVID-19*  Social distancing based on CDC and AMA recommendations.  ? ?I contacted Roselynn Whitacre on 11/22/2021 via telephone.      I clearly identified myself as [name], MD. I verified that I was speaking with the correct person using two identifiers (Name: Daniyah Fohl, and date of birth: [birth year]). ? ?Consent ?I sought verbal advanced consent from [name] for virtual visit interactions. I informed Ms. Milliner of possible security and privacy concerns, risks, and limitations associated with providing  "not-in-person" medical evaluation and management services. I also informed Ms. Seabolt of the availability of "in-person" appointments. Finally, I informed her that there would be a charge for the virtual visit and that she could be  personally, fully or partially, financially responsible for it. Ms. Fergeson expressed understanding and agreed to proceed.  ? ?Historic Elements   ?Denise Burns is a 51 y.o. year old, female patient evaluated today after our last contact on 10/27/2021. Ms. Helwig  has a past medical history of Addison disease ([place]), Arthritis, Back pain, Complication of anesthesia, and GERD (gastroesophageal reflux disease). She also  has a past surgical history that includes Cholecystectomy (2010); Polypectomy; dental work; Dilation and curettage of uterus (2010); IR NEPHROSTOMY PLACEMENT LEFT (12/14/2020); Nephrolithotomy (Left, 12/14/2020); Wisdom tooth extraction; Diagnostic laparoscopy (2004); Cystoscopy/ureteroscopy/holmium laser/stent placement (Right, 06/28/2021); Cystoscopy w/ ureteral stent removal (Right, 08/09/2021); Cystoscopy w/ retrogrades (Right, 08/09/2021); and Cystoscopy/ureteroscopy/holmium laser/stent placement (Right, 08/09/2021). Ms. Dearcos has a current medication list which includes the following prescription(s): azelastine, fludrocortisone, flonase sensimist, hydrocodone-acetaminophen, [START ON 12/02/2021] hydrocodone-acetaminophen, hydrocortisone, meloxicam, urogesic-blue, pseudoephedrine, tizanidine, and hydrocodone-acetaminophen. She  reports that she quit smoking about 27 years ago. Her smoking use included cigarettes. She has never used smokeless tobacco. She reports that she does not currently use alcohol. She reports that she does not use drugs. Ms. Stannard is allergic to sulfacetamide sodium (acne), tramadol, lisinopril, and sulfa antibiotics.  ? ?HPI  ?Today, she is being contacted for worsening of previously known (established) problem.  The patient  indicates that the pain in the area of the greater trochanteric bursa has returned and she would like to have that treated again. ? ?The patient indicates having recurrence of the pain bilaterally in the area of the trochanteric bursa.  She has requested to come in for repeat  trochanteric bursa injection, bilaterally. ? ?Pharmacotherapy Assessment  ? ?Opioid Analgesic: Hydrocodone/APAP 7.5/325 1 tablet p.o. 3 times daily (22.5 mg/day of hydrocodone)  ?MME/day: 22.5 mg/day.  ? ?Monitoring: ?[place] PMP: PDMP reviewed during this encounter.       ?Pharmacotherapy: No side-effects or adverse reactions reported. ?Compliance: No problems identified. ?Effectiveness: Clinically acceptable. ?Plan: Refer to "POC". UDS:  ?Summary  ?Date Value Ref Range Status  ?01/27/2021 Note  Final  ?  Comment:  ?  ==================================================================== ?[employer] Select 13 (MW) ?==================================================================== ?Test                             Result       Flag       Units ? ?Drug Present and Declared for Prescription Verification ?  Hydrocodone                    544          EXPECTED   ng/mg creat ?  Hydromorphone                  282          EXPECTED   ng/mg creat ?  Dihydrocodeine                 100          EXPECTED   ng/mg creat ?  Norhydrocodone                 735          EXPECTED   ng/mg creat ?   Sources of hydrocodone include scheduled prescription medications. ?   Hydromorphone, dihydrocodeine and norhydrocodone are expected ?   metabolites of hydrocodone. Hydromorphone and dihydrocodeine are ?   also available as scheduled prescription medications. ? ?==================================================================== ?Test                      Result    Flag   Units      Ref Range ?  Creatinine              171              mg/dL      >=20 ?==================================================================== ?Declared Medications: ? The flagging and  interpretation on this report are based on the ? following declared medications.  Unexpected results may arise from ? inaccuracies in the declared medications. ? ? **Note: The testing scope of this panel includes these medications: ? ? Hydrocodone (Norco) ? ? **Note: The testing scope of this panel does not include the ? following reported medications: ? ? Acetaminophen (Norco) ? Azelastine ? Fludrocortisone (Florinef) ? Fluticasone (Flonase) ? Hydrocortisone (Cortef) ? Meloxicam ? Polyethylene Glycol (Plenvu) ? Promethazine ? Pseudoephedrine (Sudafed) ? Tizanidine ?==================================================================== ?For clinical consultation, please call [phone]. ?==================================================================== ?  ?  ? ?Laboratory Chemistry Profile  ? ?Renal ?Lab Results  ?Component Value Date  ? BUN 19 08/26/2021  ? CREATININE 0.81 08/26/2021  ? BCR 20 04/23/2021  ? GFR 84.36 08/26/2021  ? GFRAA 86 08/16/2018  ? GFRNONAA >60 12/15/2020  ?  Hepatic ?Lab Results  ?Component Value Date  ? AST 21 04/23/2021  ? ALT 27 04/23/2021  ? ALBUMIN 4.3 04/23/2021  ? ALKPHOS 57 04/23/2021  ? LIPASE 30 10/06/2020  ?  ?Electrolytes ?Lab Results  ?Component Value Date  ? NA 143 08/26/2021  ? K 4.1 08/26/2021  ?  CL 105 08/26/2021  ? CALCIUM 9.4 08/26/2021  ? MG 2.1 08/16/2018  ?  Bone ?Lab Results  ?Component Value Date  ? VD25OH 13.55 (L) 04/30/2018  ? 25OHVITD1 67 08/16/2018  ? 22VVKPQA4 <1.0 08/16/2018  ? 25OHVITD3 67 08/16/2018  ?  ?Inflammation (CRP: Acute Phase) (ESR: Chronic Phase) ?Lab Results  ?Component Value Date  ? CRP 2 08/16/2018  ? ESRSEDRATE 37 (H) 08/16/2018  ? LATICACIDVEN 1.4 10/06/2020  ?    ?  ? ?Note: Above Lab results reviewed. ? ?Imaging  ?[facility] 1-60 MIN NO REPORT ?Fluoro was used, but no Radiologist interpretation will be provided.  ?Please refer to "NOTES" tab for provider progress note. ? ?Assessment  ?The primary encounter diagnosis was Chronic  hip pain (Bilateral). Diagnoses of Greater trochanteric bursitis (Bilateral) and Osteoarthritis of hips (Bilateral) were also pertinent to this visit. ? ?Plan of Care  ?Problem-specific:  ?No problem-specific Assessment

## 2021-11-22 ENCOUNTER — Telehealth: Payer: Self-pay

## 2021-11-22 ENCOUNTER — Ambulatory Visit: Payer: BC Managed Care – PPO | Attending: Pain Medicine | Admitting: Pain Medicine

## 2021-11-22 ENCOUNTER — Telehealth: Payer: Self-pay | Admitting: Pain Medicine

## 2021-11-22 ENCOUNTER — Encounter: Payer: Self-pay | Admitting: Pain Medicine

## 2021-11-22 DIAGNOSIS — M7061 Trochanteric bursitis, right hip: Secondary | ICD-10-CM

## 2021-11-22 DIAGNOSIS — M25551 Pain in right hip: Secondary | ICD-10-CM

## 2021-11-22 DIAGNOSIS — M25552 Pain in left hip: Secondary | ICD-10-CM | POA: Diagnosis not present

## 2021-11-22 DIAGNOSIS — M160 Bilateral primary osteoarthritis of hip: Secondary | ICD-10-CM | POA: Diagnosis not present

## 2021-11-22 DIAGNOSIS — M7062 Trochanteric bursitis, left hip: Secondary | ICD-10-CM

## 2021-11-22 DIAGNOSIS — G8929 Other chronic pain: Secondary | ICD-10-CM

## 2021-11-22 NOTE — Telephone Encounter (Signed)
Lm for patient to call office for pre virtual appointment questions.  

## 2021-11-22 NOTE — Patient Instructions (Signed)
______________________________________________________________________ ? ?Preparing for your procedure (without sedation) ? ?Procedure appointments are limited to planned procedures: ?No Prescription Refills. ?No disability issues will be discussed. ?No medication changes will be discussed. ? ?Instructions: ?Oral Intake: Do not eat or drink anything for at least 6 hours prior to your procedure. (Exception: Blood Pressure Medication. See below.) ?Transportation: Unless otherwise stated by your physician, you may drive yourself after the procedure. ?Blood Pressure Medicine: Do not forget to take your blood pressure medicine with a sip of water the morning of the procedure. If your Diastolic (lower reading)is above 100 mmHg, elective cases will be cancelled/rescheduled. ?Blood thinners: These will need to be stopped for procedures. Notify our staff if you are taking any blood thinners. Depending on which one you take, there will be specific instructions on how and when to stop it. ?Diabetics on insulin: Notify the staff so that you can be scheduled 1st case in the morning. If your diabetes requires high dose insulin, take only ? of your normal insulin dose the morning of the procedure and notify the staff that you have done so. ?Preventing infections: Shower with an antibacterial soap the morning of your procedure.  ?Build-up your immune system: Take 1000 mg of Vitamin C with every meal (3 times a day) the day prior to your procedure. ?Antibiotics: Inform the staff if you have a condition or reason that requires you to take antibiotics before dental procedures. ?Pregnancy: If you are pregnant, call and cancel the procedure. ?Sickness: If you have a cold, fever, or any active infections, call and cancel the procedure. ?Arrival: You must be in the facility at least 30 minutes prior to your scheduled procedure. ?Children: Do not bring any children with you. ?Dress appropriately: Bring dark clothing that you would not mind  if they get stained. ?Valuables: Do not bring any jewelry or valuables. ? ?Reasons to call and reschedule or cancel your procedure: (Following these recommendations will minimize the risk of a serious complication.) ?Surgeries: Avoid having procedures within 2 weeks of any surgery. (Avoid for 2 weeks before or after any surgery). ?Flu Shots: Avoid having procedures within 2 weeks of a flu shots or . (Avoid for 2 weeks before or after immunizations). ?Barium: Avoid having a procedure within 7-10 days after having had a radiological study involving the use of radiological contrast. (Myelograms, Barium swallow or enema study). ?Heart attacks: Avoid any elective procedures or surgeries for the initial 6 months after a "Myocardial Infarction" (Heart Attack). ?Blood thinners: It is imperative that you stop these medications before procedures. Let us know if you if you take any blood thinner.  ?Infection: Avoid procedures during or within two weeks of an infection (including chest colds or gastrointestinal problems). Symptoms associated with infections include: Localized redness, fever, chills, night sweats or profuse sweating, burning sensation when voiding, cough, congestion, stuffiness, runny nose, sore throat, diarrhea, nausea, vomiting, cold or Flu symptoms, recent or current infections. It is specially important if the infection is over the area that we intend to treat. ?Heart and lung problems: Symptoms that may suggest an active cardiopulmonary problem include: cough, chest pain, breathing difficulties or shortness of breath, dizziness, ankle swelling, uncontrolled high or unusually low blood pressure, and/or palpitations. If you are experiencing any of these symptoms, cancel your procedure and contact your primary care physician for an evaluation. ? ?Remember:  ?Regular Business hours are:  ?Monday to Thursday 8:00 AM to 4:00 PM ? ?Provider's Schedule: ?Johnita Palleschi, MD:  ?Procedure days: Tuesday and Thursday    7:30 AM to 4:00 PM ? ?Bilal Lateef, MD:  ?Procedure days: Monday and Wednesday 7:30 AM to 4:00 PM ?______________________________________________________________________ ? ____________________________________________________________________________________________ ? ?General Risks and Possible Complications ? ?Patient Responsibilities: It is important that you read this as it is part of your informed consent. It is our duty to inform you of the risks and possible complications associated with treatments offered to you. It is your responsibility as a patient to read this and to ask questions about anything that is not clear or that you believe was not covered in this document. ? ?Patient?s Rights: You have the right to refuse treatment. You also have the right to change your mind, even after initially having agreed to have the treatment done. However, under this last option, if you wait until the last second to change your mind, you may be charged for the materials used up to that point. ? ?Introduction: Medicine is not an exact science. Everything in Medicine, including the lack of treatment(s), carries the potential for danger, harm, or loss (which is by definition: Risk). In Medicine, a complication is a secondary problem, condition, or disease that can aggravate an already existing one. All treatments carry the risk of possible complications. The fact that a side effects or complications occurs, does not imply that the treatment was conducted incorrectly. It must be clearly understood that these can happen even when everything is done following the highest safety standards. ? ?No treatment: You can choose not to proceed with the proposed treatment alternative. The ?PRO(s)? would include: avoiding the risk of complications associated with the therapy. The ?CON(s)? would include: not getting any of the treatment benefits. These benefits fall under one of three categories: diagnostic; therapeutic; and/or palliative.  Diagnostic benefits include: getting information which can ultimately lead to improvement of the disease or symptom(s). Therapeutic benefits are those associated with the successful treatment of the disease. Finally, palliative benefits are those related to the decrease of the primary symptoms, without necessarily curing the condition (example: decreasing the pain from a flare-up of a chronic condition, such as incurable terminal cancer). ? ?General Risks and Complications: These are associated to most interventional treatments. They can occur alone, or in combination. They fall under one of the following six (6) categories: no benefit or worsening of symptoms; bleeding; infection; nerve damage; allergic reactions; and/or death. ?No benefits or worsening of symptoms: In Medicine there are no guarantees, only probabilities. No healthcare provider can ever guarantee that a medical treatment will work, they can only state the probability that it may. Furthermore, there is always the possibility that the condition may worsen, either directly, or indirectly, as a consequence of the treatment. ?Bleeding: This is more common if the patient is taking a blood thinner, either prescription or over the counter (example: Goody Powders, Fish oil, Aspirin, Garlic, etc.), or if suffering a condition associated with impaired coagulation (example: Hemophilia, cirrhosis of the liver, low platelet counts, etc.). However, even if you do not have one on these, it can still happen. If you have any of these conditions, or take one of these drugs, make sure to notify your treating physician. ?Infection: This is more common in patients with a compromised immune system, either due to disease (example: diabetes, cancer, human immunodeficiency virus [HIV], etc.), or due to medications or treatments (example: therapies used to treat cancer and rheumatological diseases). However, even if you do not have one on these, it can still happen. If you  have any of these conditions, or take   one of these drugs, make sure to notify your treating physician. ?Nerve Damage: This is more common when the treatment is an invasive one, but it can also happe

## 2021-11-25 ENCOUNTER — Ambulatory Visit
Admission: RE | Admit: 2021-11-25 | Discharge: 2021-11-25 | Disposition: A | Discharge status: Home / Self Care | Payer: BC Managed Care – PPO | Source: Ambulatory Visit | Attending: Pain Medicine | Admitting: Pain Medicine

## 2021-11-25 ENCOUNTER — Other Ambulatory Visit: Payer: Self-pay

## 2021-11-25 ENCOUNTER — Ambulatory Visit (HOSPITAL_BASED_OUTPATIENT_CLINIC_OR_DEPARTMENT_OTHER): Payer: BC Managed Care – PPO | Admitting: Pain Medicine

## 2021-11-25 ENCOUNTER — Encounter: Payer: Self-pay | Admitting: Pain Medicine

## 2021-11-25 VITALS — BP 132/97 | HR 79 | Temp 97.2°F | Resp 16 | Ht 64.0 in | Wt 175.0 lb

## 2021-11-25 DIAGNOSIS — M7061 Trochanteric bursitis, right hip: Secondary | ICD-10-CM | POA: Insufficient documentation

## 2021-11-25 DIAGNOSIS — M7062 Trochanteric bursitis, left hip: Secondary | ICD-10-CM | POA: Diagnosis not present

## 2021-11-25 DIAGNOSIS — M160 Bilateral primary osteoarthritis of hip: Secondary | ICD-10-CM | POA: Insufficient documentation

## 2021-11-25 DIAGNOSIS — M25552 Pain in left hip: Secondary | ICD-10-CM | POA: Diagnosis not present

## 2021-11-25 DIAGNOSIS — M25551 Pain in right hip: Secondary | ICD-10-CM

## 2021-11-25 DIAGNOSIS — G8929 Other chronic pain: Secondary | ICD-10-CM | POA: Insufficient documentation

## 2021-11-25 MED ORDER — LIDOCAINE HCL 2 % IJ SOLN
20.0000 mL | Freq: Once | INTRAMUSCULAR | Status: AC
  Administered 2021-11-25: 400 mg
  Filled 2021-11-25: qty 20

## 2021-11-25 MED ORDER — PENTAFLUOROPROP-TETRAFLUOROETH EX AERO
INHALATION_SPRAY | Freq: Once | CUTANEOUS | Status: DC
  Filled 2021-11-25: qty 116

## 2021-11-25 MED ORDER — ROPIVACAINE HCL 2 MG/ML IJ SOLN
8.0000 mL | Freq: Once | INTRAMUSCULAR | Status: AC
  Administered 2021-11-25: 8 mL via INTRA_ARTICULAR
  Filled 2021-11-25: qty 20

## 2021-11-25 MED ORDER — METHYLPREDNISOLONE ACETATE 80 MG/ML IJ SUSP
160.0000 mg | Freq: Once | INTRAMUSCULAR | Status: AC
  Administered 2021-11-25: 160 mg via INTRA_ARTICULAR
  Filled 2021-11-25: qty 2

## 2021-11-25 NOTE — Progress Notes (Signed)
PROVIDER NOTE: Interpretation of information contained herein should be left to medically-trained personnel. Specific patient instructions are provided elsewhere under "Patient Instructions" section of medical record. This document was created in part using STT-dictation technology, any transcriptional errors that may result from this process are unintentional.  ?Patient: Denise Burns ?Type: Established ?DOB: [birth year] ?MRN: [id number] ?PCP: [name], NP  Service: Procedure ?DOS: 11/25/2021 ?Setting: Ambulatory ?Location: Ambulatory outpatient facility ?Delivery: Face-to-face Provider: [name], MD ?Specialty: Interventional Pain Management ?Specialty designation: 09 ?Location: Outpatient facility ?Ref. Prov.: [name], NP   ? ?Primary Reason for Visit: Interventional Pain Management Treatment. ?CC: Hip Pain (bilateral) ? ?  ?Procedure:          Anesthesia, Analgesia, Anxiolysis:  ?Type: Hip bursa injection #2  ?Primary Purpose: Therapeutic ?Region: Upper (proximal) Femoral Region ?Level: Hip Joint ?Target Area: Trochanteric Bursa ?Approach: Posterolateral approach ?Laterality: Bilateral  Type: Local Anesthesia ?Local Anesthetic: Lidocaine 1-2% ?Sedation: None  ?Indication(s):  Analgesia ?Route: Infiltration ([place]/IM) ?IV Access: N/A ? ? ?Position: Supine  ? ?1. Chronic hip pain (Bilateral)   ?2. Greater trochanteric bursitis (Bilateral)   ?3. Osteoarthritis of hips (Bilateral)   ? ?NAS-11 Pain score:  ? Pre-procedure: 4 /10  ? Post-procedure: 0-No pain/10  ? ?  ?Pre-op H&P Assessment:  ?Denise Burns is a 51 y.o. (year old), female patient, seen today for interventional treatment. She  has a past surgical history that includes Cholecystectomy (2010); Polypectomy; dental work; Dilation and curettage of uterus (2010); IR NEPHROSTOMY PLACEMENT LEFT (12/14/2020); Nephrolithotomy (Left, 12/14/2020); Wisdom tooth extraction; Diagnostic laparoscopy (2004); Cystoscopy/ureteroscopy/holmium  laser/stent placement (Right, 06/28/2021); Cystoscopy w/ ureteral stent removal (Right, 08/09/2021); Cystoscopy w/ retrogrades (Right, 08/09/2021); and Cystoscopy/ureteroscopy/holmium laser/stent placement (Right, 08/09/2021). Denise Burns has a current medication list which includes the following prescription(s): azelastine, fludrocortisone, flonase sensimist, hydrocodone-acetaminophen, hydrocodone-acetaminophen, [START ON 12/02/2021] hydrocodone-acetaminophen, hydrocortisone, meloxicam, urogesic-blue, pseudoephedrine, and tizanidine, and the following Facility-Administered Medications: pentafluoroprop-tetrafluoroeth. Her primarily concern today is the Hip Pain (bilateral) ? ?Initial Vital Signs:  ?Pulse/HCG Rate: 79ECG Heart Rate: 81 ?Temp: (!) 97.2 ?F (36.2 ?C) ?Resp: 18 ?BP: 113/78 ?SpO2: 100 % ? ?BMI: Estimated body mass index is 30.04 kg/m? as calculated from the following: ?  Height as of this encounter: '5\' 4"'$  (1.626 m). ?  Weight as of this encounter: 175 lb (79.4 kg). ? ?Risk Assessment: ?Allergies: Reviewed. She is allergic to sulfacetamide sodium (acne), tramadol, lisinopril, and sulfa antibiotics.  ?Allergy Precautions: None required ?Coagulopathies: Reviewed. None identified.  ?Blood-thinner therapy: None at this time ?Active Infection(s): Reviewed. None identified. Denise Burns is afebrile ? ?Site Confirmation: Denise Burns was asked to confirm the procedure and laterality before marking the site ?Procedure checklist: Completed ?Consent: Before the procedure and under the influence of no sedative(s), amnesic(s), or anxiolytics, the patient was informed of the treatment options, risks and possible complications. To fulfill our ethical and legal obligations, as recommended by the American Medical Association's Code of Ethics, I have informed the patient of my clinical impression; the nature and purpose of the treatment or procedure; the risks, benefits, and possible complications of the intervention; the  alternatives, including doing nothing; the risk(s) and benefit(s) of the alternative treatment(s) or procedure(s); and the risk(s) and benefit(s) of doing nothing. ?The patient was provided information about the general risks and possible complications associated with the procedure. These may include, but are not limited to: failure to achieve desired goals, infection, bleeding, organ or nerve damage, allergic reactions, paralysis, and death. ?In addition, the patient was  informed of those risks and complications associated to the procedure, such as failure to decrease pain; infection; bleeding; organ or nerve damage with subsequent damage to sensory, motor, and/or autonomic systems, resulting in permanent pain, numbness, and/or weakness of one or several areas of the body; allergic reactions; (i.e.: anaphylactic reaction); and/or death. ?Furthermore, the patient was informed of those risks and complications associated with the medications. These include, but are not limited to: allergic reactions (i.e.: anaphylactic or anaphylactoid reaction(s)); adrenal axis suppression; blood sugar elevation that in diabetics may result in ketoacidosis or comma; water retention that in patients with history of congestive heart failure may result in shortness of breath, pulmonary edema, and decompensation with resultant heart failure; weight gain; swelling or edema; medication-induced neural toxicity; particulate matter embolism and blood vessel occlusion with resultant organ, and/or nervous system infarction; and/or aseptic necrosis of one or more joints. ?Finally, the patient was informed that Medicine is not an exact science; therefore, there is also the possibility of unforeseen or unpredictable risks and/or possible complications that may result in a catastrophic outcome. The patient indicated having understood very clearly. We have given the patient no guarantees and we have made no promises. Enough time was given to the  patient to ask questions, all of which were answered to the patient's satisfaction. Denise Burns has indicated that she wanted to continue with the procedure. ?Attestation: I, the ordering provider, attest that I have discussed with the patient the benefits, risks, side-effects, alternatives, likelihood of achieving goals, and potential problems during recovery for the procedure that I have provided informed consent. ?Date  Time: 11/25/2021  8:38 AM ? ?Pre-Procedure Preparation:  ?Monitoring: As per clinic protocol. Respiration, ETCO2, SpO2, BP, heart rate and rhythm monitor placed and checked for adequate function ?Safety Precautions: Patient was assessed for positional comfort and pressure points before starting the procedure. ?Time-out: I initiated and conducted the "Time-out" before starting the procedure, as per protocol. The patient was asked to participate by confirming the accuracy of the "Time Out" information. Verification of the correct person, site, and procedure were performed and confirmed by me, the nursing staff, and the patient. "Time-out" conducted as per Joint Commission's Universal Protocol (UP.01.01.01). ?Time: 0921 ? ?Description of Procedure:          ?Area Prepped: Entire Posterolateral hip area. ?DuraPrep (Iodine Povacrylex [0.7% available iodine] and Isopropyl Alcohol, 74% w/w) ?Safety Precautions: Aspiration looking for blood return was conducted prior to all injections. At no point did we inject any substances, as a needle was being advanced. No attempts were made at seeking any paresthesias. Safe injection practices and needle disposal techniques used. Medications properly checked for expiration dates. SDV (single dose vial) medications used. ?Description of the Procedure: Protocol guidelines were followed. The patient was placed in position over the procedure table. The target area was identified and the area prepped in the usual manner. Skin & deeper tissues infiltrated with local  anesthetic. Appropriate amount of time allowed to pass for local anesthetics to take effect. The procedure needles were then advanced to the target area. Proper needle placement secured. Negative aspiration confirmed. S

## 2021-11-25 NOTE — Progress Notes (Signed)
Safety precautions to be maintained throughout the outpatient stay will include: orient to surroundings, keep bed in low position, maintain call bell within reach at all times, provide assistance with transfer out of bed and ambulation.  

## 2021-11-25 NOTE — Patient Instructions (Signed)

## 2021-11-29 ENCOUNTER — Encounter: Payer: BC Managed Care – PPO | Admitting: Pain Medicine

## 2021-12-02 ENCOUNTER — Ambulatory Visit: Payer: BC Managed Care – PPO | Attending: Pain Medicine | Admitting: Pain Medicine

## 2021-12-02 DIAGNOSIS — M160 Bilateral primary osteoarthritis of hip: Secondary | ICD-10-CM

## 2021-12-02 DIAGNOSIS — G894 Chronic pain syndrome: Secondary | ICD-10-CM

## 2021-12-02 DIAGNOSIS — M25551 Pain in right hip: Secondary | ICD-10-CM

## 2021-12-02 DIAGNOSIS — M7062 Trochanteric bursitis, left hip: Secondary | ICD-10-CM

## 2021-12-02 DIAGNOSIS — G8929 Other chronic pain: Secondary | ICD-10-CM

## 2021-12-02 DIAGNOSIS — M25552 Pain in left hip: Secondary | ICD-10-CM

## 2021-12-02 DIAGNOSIS — M7061 Trochanteric bursitis, right hip: Secondary | ICD-10-CM | POA: Diagnosis not present

## 2021-12-02 NOTE — Progress Notes (Signed)
Patient: Denise Burns  Service Category: E/M  Provider: [name], MD  ?DOB: 02-Sep-1970  DOS: 12/02/2021  Location: Office  ?MRN: [id number]  Setting: Ambulatory outpatient  Referring Provider: [name], NP  ?Type: Established Patient  Specialty: Interventional Pain Management  PCP: Denise Freshwater, NP  ?Location: Remote location  Delivery: TeleHealth    ? ?Virtual Encounter - Pain Management ?PROVIDER NOTE: Information contained herein reflects review and annotations entered in association with encounter. Interpretation of such information and data should be left to medically-trained personnel. Information provided to patient can be located elsewhere in the medical record under "Patient Instructions". Document created using STT-dictation technology, any transcriptional errors that may result from process are unintentional.  ?  ?Contact & Pharmacy ?Preferred: [phone] ?Home: [phone] (home) ?Mobile: [phone] (mobile) ?E-mail: [email or web address]  ?[facility] ?[address] ?[place] [address] ?Phone: [phone] Fax: [phone] ? ?[place] [place] ?[address] ?[address] ?[place] [address] ?Phone: [phone] Fax: [phone] ?  ?Pre-screening  ?[name] offered "in-person" vs "virtual" encounter. She indicated preferring virtual for this encounter.  ? ?Reason ?COVID-19*  Social distancing based on CDC and AMA recommendations.  ? ?I contacted Denise Burns on 12/02/2021 via telephone.      I clearly identified myself as Denise Cola, MD. I verified that I was speaking with the correct person using two identifiers (Name: Denise Burns, and date of birth: 1970-10-01). ? ?Consent ?I sought verbal advanced consent from Denise Burns for virtual visit interactions. I informed Denise Burns of possible security and privacy concerns, risks, and limitations associated with providing  "not-in-person" medical evaluation and management services. I also informed Denise Burns of the availability of "in-person" appointments. Finally, I informed her that there would be a charge for the virtual visit and that she could be  personally, fully or partially, financially responsible for it. Denise Burns expressed understanding and agreed to proceed.  ? ?Historic Elements   ?[name] is a 51 y.o. year old, female patient evaluated today after our last contact on 11/25/2021. [name]  has a past medical history of Addison disease ([place]), Arthritis, Back pain, Complication of anesthesia, and GERD (gastroesophageal reflux disease). She also  has a past surgical history that includes Cholecystectomy (2010); Polypectomy; dental work; Dilation and curettage of uterus (2010); IR NEPHROSTOMY PLACEMENT LEFT (12/14/2020); Nephrolithotomy (Left, 12/14/2020); Wisdom tooth extraction; Diagnostic laparoscopy (2004); Cystoscopy/ureteroscopy/holmium laser/stent placement (Right, 06/28/2021); Cystoscopy w/ ureteral stent removal (Right, 08/09/2021); Cystoscopy w/ retrogrades (Right, 08/09/2021); and Cystoscopy/ureteroscopy/holmium laser/stent placement (Right, 08/09/2021). Denise Burns has a current medication list which includes the following prescription(s): azelastine, fludrocortisone, flonase sensimist, hydrocodone-acetaminophen, hydrocodone-acetaminophen, hydrocortisone, meloxicam, urogesic-blue, pseudoephedrine, tizanidine, and hydrocodone-acetaminophen. She  reports that she quit smoking about 27 years ago. Her smoking use included cigarettes. She has never used smokeless tobacco. She reports that she does not currently use alcohol. She reports that she does not use drugs. Denise Burns is allergic to sulfacetamide sodium (acne), tramadol, lisinopril, and sulfa antibiotics.  ? ?HPI  ?Today, she is being contacted for a post-procedure assessment.  According to the patient she has attained an ongoing 80% relief  of her hip pain.  She refers that it is not bothering her until the end of the day or if she has to sit down on the floor, then some of the pain seems to begin to come back.  However she refers that at  this point it is a lot more manageable.  I have encouraged the patient to give me a call if and when the pain comes back.  She understood and agree. ? ?RTCB: 01/01/2022 ? ?Post-procedure evaluation  ?  ?Procedure:          Anesthesia, Analgesia, Anxiolysis:  ?Type: Hip bursa injection #2  ?Primary Purpose: Therapeutic ?Region: Upper (proximal) Femoral Region ?Level: Hip Joint ?Target Area: Trochanteric Bursa ?Approach: Posterolateral approach ?Laterality: Bilateral  Type: Local Anesthesia ?Local Anesthetic: Lidocaine 1-2% ?Sedation: None  ?Indication(s):  Analgesia ?Route: Infiltration ([place]/IM) ?IV Access: N/A ? ? ?Position: Supine  ? ?1. Chronic hip pain (Bilateral)   ?2. Greater trochanteric bursitis (Bilateral)   ?3. Osteoarthritis of hips (Bilateral)   ? ?NAS-11 Pain score:  ? Pre-procedure: 4 /10  ? Post-procedure: 0-No pain/10  ? ?  ?Effectiveness:  ?Initial hour after procedure: 100 %. ?Subsequent 4-6 hours post-procedure: 100 %. ?Analgesia past initial 6 hours: 80 % (has been 6 days since procedure). ?Ongoing improvement:  ?Analgesic: The patient indicates having an ongoing 80% relief of her hip pain. ?Function: Denise Burns reports improvement in function ?ROM: Denise Burns reports improvement in ROM ? ?Pharmacotherapy Assessment  ? ?Opioid Analgesic: Hydrocodone/APAP 7.5/325 1 tablet p.o. 3 times daily (22.5 mg/day of hydrocodone)  ?MME/day: 22.5 mg/day.  ? ?Monitoring: ? PMP: PDMP reviewed during this encounter.       ?Pharmacotherapy: No side-effects or adverse reactions reported. ?Compliance: No problems identified. ?Effectiveness: Clinically acceptable. ?Plan: Refer to "POC". UDS:  ?Summary  ?Date Value Ref Range Status  ?01/27/2021 Note  Final  ?  Comment:  ?   ==================================================================== ?[facility] Select 13 (MW) ?==================================================================== ?Test                             Result       Flag       Units ? ?Drug Present and Declared for Prescription Verification ?  Hydrocodone                    544          EXPECTED   ng/mg creat ?  Hydromorphone                  282          EXPECTED   ng/mg creat ?  Dihydrocodeine                 100          EXPECTED   ng/mg creat ?  Norhydrocodone                 735          EXPECTED   ng/mg creat ?   Sources of hydrocodone include scheduled prescription medications. ?   Hydromorphone, dihydrocodeine and norhydrocodone are expected ?   metabolites of hydrocodone. Hydromorphone and dihydrocodeine are ?   also available as scheduled prescription medications. ? ?==================================================================== ?Test                      Result    Flag   Units      Ref Range ?  Creatinine              171              mg/dL      >=20 ?==================================================================== ?Declared Medications: ? The flagging and  interpretation on this report are based on the ? following declared medications.  Unexpected results may arise from ? inaccuracies in the declared medications. ? ? **Note: The testing scope of this panel includes these medications: ? ? Hydrocodone ([place]co) ? ? **Note: The testing scope of this panel does not include the ? following reported medications: ? ? Acetaminophen (Norco) ? Azelastine ? Fludrocortisone (Florinef) ? Fluticasone (Flonase) ? Hydrocortisone (Cortef) ? Meloxicam ? Polyethylene Glycol (Plenvu) ? Promethazine ? Pseudoephedrine (Sudafed) ? Tizanidine ?==================================================================== ?For clinical consultation, please call [phone]. ?==================================================================== ?  ?  ? ?Laboratory Chemistry Profile   ? ?Renal ?Lab Results  ?Component Value Date  ? BUN 19 08/26/2021  ? CREATININE 0.81 08/26/2021  ? BCR 20 04/23/2021  ? GFR 84.36 08/26/2021  ? GFRAA 86 08/16/2018  ? GFRNONAA >60 12/15/2020  ?  Hepatic ?Lab Results  ?Component Value Date  ? AST 21

## 2021-12-03 ENCOUNTER — Other Ambulatory Visit: Payer: Self-pay

## 2021-12-05 NOTE — Progress Notes (Signed)
PROVIDER NOTE: Information contained herein reflects review and annotations entered in association with encounter. Interpretation of such information and data should be left to medically-trained personnel. Information provided to patient can be located elsewhere in the medical record under "Patient Instructions". Document created using STT-dictation technology, any transcriptional errors that may result from process are unintentional.  ?  ?Patient: Denise Burns  Service Category: E/M  Provider: [name], MD  ?DOB: [birth year]  DOS: 12/08/2021  Specialty: Interventional Pain Management  ?MRN: [id number]  Setting: Ambulatory outpatient  PCP: [name], NP  ?Type: Established Patient    Referring Provider: [name], NP  ?Location: Office  Delivery: Face-to-face    ? ?HPI  ?[name], a 51 y.o. year old female, is here today because of her Chronic pain syndrome [G89.4]. [name]'s primary complain today is Back Pain (low) and Hip Pain (bilateral) ?Last encounter: My last encounter with her was on 12/02/2021. ?Pertinent problems: [name] has Chronic midline low back pain with bilateral sciatica; Chronic low back pain (1ry area of Pain) (Bilateral) (R>L) w/o sciatica; Chronic sacroiliac joint pain (2ry area of Pain) (Right); Chronic pain syndrome; Abnormal MRI, lumbar spine (03/22/2018); Lumbar facet hypertrophy (Multilevel) (Bilateral); Lumbar facet syndrome (Bilateral); DDD (degenerative disc disease), lumbosacral; Spondylolisthesis (8 mm) at L5-S1 level; Lumbar Grade 1 Retrolisthesis (2 mm) of L2/L3; L5-S1 pars defect w/ spondylolisthesis (Bilateral); Lumbar pars defect (L5-S1) (Bilateral); Lumbar foraminal stenosis (Left: L2-3) (Bilateral: L5-S1); Osteoarthritis of sacroiliac joint (Bilateral); Chronic musculoskeletal pain; Neurogenic pain; Osteoarthritis of facet joint of lumbar spine; Greater trochanteric bursitis (Bilateral); Lumbar spondylosis; Spondylosis without  myelopathy or radiculopathy, lumbosacral region; Chronic hip pain (Right); Osteoarthritis of hip (Right); Lumbar radiculitis (L1/L2) (Right); Chronic groin pain (Right); Chronic hip pain (Bilateral); Enthesopathy hip region (Left); Osteoarthritis of hips (Bilateral); Kissing spine syndrome; Chronic low back pain (Midline) w/o sciatica; and Trigger point with back pain (PSIS area) (Right) on their pertinent problem list. ?Pain Assessment: Severity of Chronic pain is reported as a 3 /10. Location: Back Lower/hips. Onset: More than a month ago. Quality: Aching, Shooting. Timing: Constant. Modifying factor(s): medications, rest, hot and cold packs, topicals, injections. ?Vitals:  height is '5\' 4"'$  (1.626 m) and weight is 175 lb (79.4 kg). Her temporal temperature is 97.2 ?F (36.2 ?C) (abnormal). Her blood pressure is 153/87 (abnormal) and her pulse is 107 (abnormal). Her respiration is 18 and oxygen saturation is 100%.  ? ?Reason for encounter: medication management.  The patient indicates doing well with the current medication regimen. No adverse reactions or side effects reported to the medications.   ? ?RTCB: 04/02/2022 ?Nonopioids transferred 05/11/2020: Baclofen and Mobic ? ?Pharmacotherapy Assessment  ?Analgesic: Hydrocodone/APAP 7.5/325 1 tablet p.o. 3 times daily (22.5 mg/day of hydrocodone)  ?MME/day: 22.5 mg/day.  ? ?Monitoring: ?[place] PMP: PDMP reviewed during this encounter.       ?Pharmacotherapy: No side-effects or adverse reactions reported. ?Compliance: No problems identified. ?Effectiveness: Clinically acceptable. ? ?Denise Rochester, RN  12/08/2021  1:36 PM  Sign when Signing Visit ?Nursing Pain Medication Assessment:  ?Safety precautions to be maintained throughout the outpatient stay will include: orient to surroundings, keep bed in low position, maintain call bell within reach at all times, provide assistance with transfer out of bed and ambulation.  ?Medication Inspection Compliance: Pill count  conducted under aseptic conditions, in front of the patient. Neither the pills nor the bottle was removed from the patient's sight at any time. Once count was completed pills were  immediately returned to the patient in their original bottle. ? ?Medication: Hydrocodone/APAP ?Pill/Patch Count:  67 of 90 pills remain ?Pill/Patch Appearance: Markings consistent with prescribed medication ?Bottle Appearance: Standard pharmacy container. Clearly labeled. ?Filled Date: 05 / 05 / 2023 ?Last Medication intake:  Today ?   UDS:  ?Summary  ?Date Value Ref Range Status  ?01/27/2021 Note  Final  ?  Comment:  ?  ==================================================================== ?[facility] Select 13 (MW) ?==================================================================== ?Test                             Result       Flag       Units ? ?Drug Present and Declared for Prescription Verification ?  Hydrocodone                    544          EXPECTED   ng/mg creat ?  Hydromorphone                  282          EXPECTED   ng/mg creat ?  Dihydrocodeine                 100          EXPECTED   ng/mg creat ?  Norhydrocodone                 735          EXPECTED   ng/mg creat ?   Sources of hydrocodone include scheduled prescription medications. ?   Hydromorphone, dihydrocodeine and norhydrocodone are expected ?   metabolites of hydrocodone. Hydromorphone and dihydrocodeine are ?   also available as scheduled prescription medications. ? ?==================================================================== ?Test                      Result    Flag   Units      Ref Range ?  Creatinine              171              mg/dL      >=20 ?==================================================================== ?Declared Medications: ? The flagging and interpretation on this report are based on the ? following declared medications.  Unexpected results may arise from ? inaccuracies in the declared medications. ? ? **Note: The testing scope of this panel  includes these medications: ? ? Hydrocodone (Norco) ? ? **Note: The testing scope of this panel does not include the ? following reported medications: ? ? Acetaminophen (Norco) ? Azelastine ? Fludrocortisone (Florinef) ? Fluticasone (Flonase) ? Hydrocortisone (Cortef) ? Meloxicam ? Polyethylene Glycol (Plenvu) ? Promethazine ? Pseudoephedrine (Sudafed) ? Tizanidine ?==================================================================== ?For clinical consultation, please call [phone]. ?==================================================================== ?  ?  ? ?ROS  ?Constitutional: Denies any fever or chills ?Gastrointestinal: No reported hemesis, hematochezia, vomiting, or acute GI distress ?Musculoskeletal: Denies any acute onset joint swelling, redness, loss of ROM, or weakness ?Neurological: No reported episodes of acute onset apraxia, aphasia, dysarthria, agnosia, amnesia, paralysis, loss of coordination, or loss of consciousness ? ?Medication Review  ?Azelastine HCl, HYDROcodone-acetaminophen, Urogesic-Blue, fludrocortisone, fluticasone, hydrocortisone, meloxicam, pseudoephedrine, and tiZANidine ? ?History Review  ?Allergy: Ms. Brandt is allergic to sulfacetamide sodium (acne), tramadol, lisinopril, and sulfa antibiotics. ?Drug: Ms. Justiss  reports no history of drug use. ?Alcohol:  reports that she does not currently use alcohol. ?Tobacco:  reports that she quit smoking  about 27 years ago. Her smoking use included cigarettes. She has never used smokeless tobacco. ?Social: Ms. Manthe  reports that she quit smoking about 27 years ago. Her smoking use included cigarettes. She has never used smokeless tobacco. She reports that she does not currently use alcohol. She reports that she does not use drugs. ?Medical:  has a past medical history of Addison disease ([place]), Arthritis, Back pain, Complication of anesthesia, and GERD (gastroesophageal reflux disease). ?Surgical: Ms. Csaszar  has a past surgical  history that includes Cholecystectomy (2010); Polypectomy; dental work; Dilation and curettage of uterus (2010); IR NEPHROSTOMY PLACEMENT LEFT (12/14/2020); Nephrolithotomy (Left, 12/14/2020); Wisdom tooth

## 2021-12-06 ENCOUNTER — Other Ambulatory Visit: Payer: Self-pay | Admitting: Nurse Practitioner

## 2021-12-06 ENCOUNTER — Other Ambulatory Visit: Payer: Self-pay

## 2021-12-06 DIAGNOSIS — M47816 Spondylosis without myelopathy or radiculopathy, lumbar region: Secondary | ICD-10-CM

## 2021-12-06 MED ORDER — MELOXICAM 15 MG PO TABS
15.0000 mg | ORAL_TABLET | Freq: Every day | ORAL | 0 refills | Status: DC
  Filled 2021-12-06: qty 90, 90d supply, fill #0

## 2021-12-08 ENCOUNTER — Other Ambulatory Visit: Payer: Self-pay

## 2021-12-08 ENCOUNTER — Ambulatory Visit: Payer: BC Managed Care – PPO | Attending: Pain Medicine | Admitting: Pain Medicine

## 2021-12-08 ENCOUNTER — Encounter: Payer: Self-pay | Admitting: Pain Medicine

## 2021-12-08 VITALS — BP 153/87 | HR 107 | Temp 97.2°F | Resp 18 | Ht 64.0 in | Wt 175.0 lb

## 2021-12-08 DIAGNOSIS — M47816 Spondylosis without myelopathy or radiculopathy, lumbar region: Secondary | ICD-10-CM | POA: Insufficient documentation

## 2021-12-08 DIAGNOSIS — M25551 Pain in right hip: Secondary | ICD-10-CM | POA: Diagnosis not present

## 2021-12-08 DIAGNOSIS — M533 Sacrococcygeal disorders, not elsewhere classified: Secondary | ICD-10-CM | POA: Insufficient documentation

## 2021-12-08 DIAGNOSIS — M545 Low back pain, unspecified: Secondary | ICD-10-CM | POA: Diagnosis not present

## 2021-12-08 DIAGNOSIS — G8929 Other chronic pain: Secondary | ICD-10-CM | POA: Diagnosis not present

## 2021-12-08 DIAGNOSIS — Z79899 Other long term (current) drug therapy: Secondary | ICD-10-CM | POA: Diagnosis not present

## 2021-12-08 DIAGNOSIS — Z79891 Long term (current) use of opiate analgesic: Secondary | ICD-10-CM | POA: Insufficient documentation

## 2021-12-08 DIAGNOSIS — G894 Chronic pain syndrome: Secondary | ICD-10-CM | POA: Diagnosis not present

## 2021-12-08 DIAGNOSIS — M25552 Pain in left hip: Secondary | ICD-10-CM | POA: Insufficient documentation

## 2021-12-08 MED ORDER — HYDROCODONE-ACETAMINOPHEN 7.5-325 MG PO TABS
1.0000 | ORAL_TABLET | Freq: Three times a day (TID) | ORAL | 0 refills | Status: DC | PRN
  Filled 2022-01-03 (×2): qty 90, 30d supply, fill #0

## 2021-12-08 MED ORDER — HYDROCODONE-ACETAMINOPHEN 7.5-325 MG PO TABS
1.0000 | ORAL_TABLET | Freq: Three times a day (TID) | ORAL | 0 refills | Status: DC | PRN
  Filled 2022-03-04: qty 90, 30d supply, fill #0

## 2021-12-08 MED ORDER — HYDROCODONE-ACETAMINOPHEN 7.5-325 MG PO TABS
1.0000 | ORAL_TABLET | Freq: Three times a day (TID) | ORAL | 0 refills | Status: DC | PRN
  Filled 2022-02-02: qty 90, 30d supply, fill #0

## 2021-12-08 NOTE — Progress Notes (Signed)
Nursing Pain Medication Assessment:  ?Safety precautions to be maintained throughout the outpatient stay will include: orient to surroundings, keep bed in low position, maintain call bell within reach at all times, provide assistance with transfer out of bed and ambulation.  ?Medication Inspection Compliance: Pill count conducted under aseptic conditions, in front of the patient. Neither the pills nor the bottle was removed from the patient's sight at any time. Once count was completed pills were immediately returned to the patient in their original bottle. ? ?Medication: Hydrocodone/APAP ?Pill/Patch Count:  67 of 90 pills remain ?Pill/Patch Appearance: Markings consistent with prescribed medication ?Bottle Appearance: Standard pharmacy container. Clearly labeled. ?Filled Date: 05 / 05 / 2023 ?Last Medication intake:  Today ?

## 2021-12-08 NOTE — Patient Instructions (Signed)
____________________________________________________________________________________________ ? ?Pharmacy Shortages of Pain Medication  ? ?Introduction ?Shockingly as it may seem, ? ? ??No U.S[place] Supreme Court decision has ever interpreted the Constitution as guaranteeing a right to health care for all Americans.? ?- https://www.healthequityandpolicylab.com/elusive-right-to-health-care-under-us-law ? ??With respect to human rights, the [place] has no formally codified right to health, nor does it participate in a human rights treaty that specifies a right to health.? ?- Scott J. Schweikart, JD, MBE ? ?Situation ?By now, most of our patients have had the experience of being told by their pharmacist that they do not have enough medication to cover their prescription. If you have not had this experience, just know that you soon will. ? ?Problem ?There appears to be a shortage of these medications, either at the national level or locally. This is happening with all pharmacies. When there is not enough medication, patients are offered a partial fill and they are told that they will try to get the rest of the medicine for them at a later time. If they do not have enough for even a partial fill, the pharmacists are telling the patients to call us (the prescribing physicians) to request that we send another prescription to another pharmacy to get the medicine.  ? ?This reordering of a controlled substance creates documentation problems where additional paperwork needs to be created to explain why two prescriptions for the same period of time and the same medicine are being prescribed to the same patient. It also creates situations where the last appointment note does not accurately reflect when and what prescriptions were given to a patient. This leads to prescribing errors down the line, in subsequent follow-up visits.  ? ?[place] Board of Pharmacy ([place]BOP) ?Research revealed that Board of Pharmacy Rule .1806 (21  [place]AC 46.1806) authorizes pharmacists to the transfer of prescriptions among pharmacies, and it sets forth procedural and recordkeeping requirements for doing so. However, this requires the pharmacist to complete the previously mentioned procedural paperwork to accomplish the transfer. As it turns out, it is much easier for them to have the prescribing physicians do the work.  ? ?Possible solutions ?1. Have the [place] Assembly add a provision to the "STOP ACT" (the law that mandates how controlled substances are prescribed) where there is an exception to the electronic prescribing rule that states that in the event there are shortages of medications the physicians are allowed to use written prescriptions as opposed to electronic ones. This would allow patients to take their prescriptions to a different pharmacy that may have enough medication available to fill the prescription. The problem is that currently there is a law that does not allow for written prescriptions, with the exception of instances where the electronic medical record is down due to technical issues.  ?2. Have [place] Congress ease the pressure on pharmaceutical companies, allowing them to produce enough quantities of the medication to adequately supply the population. ?3. Have pharmacies keep enough stocks of these medications to cover their client base.  ?4. Have the [place] State Assembly add a provision to the "STOP ACT" where they ease the regulations surrounding the transfer of controlled substances between pharmacies, so as to simplify the transfer of supplies. As an alternative, develop a system to allow patients to obtain the remainder of their prescription at another one of their pharmacies or at an associate pharmacy.  ? ?How this shortage will affect you.  ?The one thing that is abundantly clear is that this is a pharmacy supply   problem  and not a prescriber problem. The job of the prescriber is to evaluate and monitor the  patients for the appropriate indications to the use of these medicines, the monitoring of their use and the prescribing of the appropriate dose and regimen. It is not the job of the prescriber to provide or dispense the actual medication. By law, this is the job of the pharmacies and pharmacists. It is certainly not the job of the prescriber to solve the supply problems.  ? ?Due to the above problems we are no longer taking patients to write for their pain medication. We will continue to evaluate for appropriate indications and we may provide recommendations regarding medication, dose, and schedule, as well as monitoring recommendations, however, we will not be taking over the actual prescribing of these substances. On those patients where we are treating their chronic pain with interventional therapies, exceptions will be considered on a case by case basis. At this time, we will try to continue providing this supplemental service to those patients we have been managing in the past. However, as of August 1st, 2023, we no longer will be sending additional prescriptions to other pharmacies for the purpose of solving their supply problems. Once we send a prescription to a pharmacy, we will not be resending it again to another pharmacy to cover for their shortages.  ? ?What to do. ?Write as many letters as you can. Recruit the help of family members in writing these letters. Below are some of the places where you can write to make your voice heard. Let them know what the problem is and push them to look for solutions.  ? ?Search internet for: ?[place] find your legislators? ?https://www.ncleg.gov/findyourlegislators ? ?Search internet for: ?[place] insurance commissioner complaints? ?https://www.ncdoi.gov/contactscomplaints/assistance-or-file-complaint ? ?Search internet for: ?[place] Board of Pharmacy complaints? ?http://www.ncbop.org/contact.htm ? ?Search internet for: ?[facility] pharmacy  complaints? ?Email [facility] Customer Relations ?https://www.[facility].com/help/email-customer-relations.jsp?callType=store ? ?Search internet for: ?[facility] pharmacy customer service complaints? ?https://www.[facility].com/topic/marketing/contactus/contactus_customerservice.jsp ? ?____________________________________________________________________________________________ ? ____________________________________________________________________________________________ ? ?Medication Rules ? ?Purpose: To inform patients, and their family members, of our rules and regulations. ? ?Applies to: All patients receiving prescriptions (written or electronic). ? ?Pharmacy of record: Pharmacy where electronic prescriptions will be sent. If written prescriptions are taken to a different pharmacy, please inform the nursing staff. The pharmacy listed in the electronic medical record should be the one where you would like electronic prescriptions to be sent. ? ?Electronic prescriptions: In compliance with the [place] Strengthen Opioid Misuse Prevention (STOP) Act of 2017 (Session Law 2017-74/H243), effective August 01, 2018, all controlled substances must be electronically prescribed. Calling prescriptions to the pharmacy will cease to exist. ? ?Prescription refills: Only during scheduled appointments. Applies to all prescriptions. ? ?NOTE: The following applies primarily to controlled substances (Opioid* Pain Medications).  ? ?Type of encounter (visit): For patients receiving controlled substances, face-to-face visits are required. (Not an option or up to the patient.) ? ?Patient's responsibilities: ?Pain Pills: Bring all pain pills to every appointment (except for procedure appointments). ?Pill Bottles: Bring pills in original pharmacy bottle. Always bring the newest bottle. Bring bottle, even if empty. ?Medication refills: You are responsible for knowing and keeping track of what medications you take and those you need  refilled. ?The day before your appointment: write a list of all prescriptions that need to be refilled. ?The day of the appointment: give the list to the admitting nurse. Prescriptions will be written only during appoint

## 2021-12-23 ENCOUNTER — Ambulatory Visit: Payer: BC Managed Care – PPO | Admitting: Nurse Practitioner

## 2021-12-29 ENCOUNTER — Other Ambulatory Visit: Payer: Self-pay

## 2022-01-03 ENCOUNTER — Other Ambulatory Visit: Payer: Self-pay

## 2022-01-06 ENCOUNTER — Other Ambulatory Visit: Payer: Self-pay

## 2022-01-07 ENCOUNTER — Other Ambulatory Visit: Payer: Self-pay

## 2022-02-02 ENCOUNTER — Other Ambulatory Visit: Payer: Self-pay

## 2022-03-04 ENCOUNTER — Other Ambulatory Visit: Payer: Self-pay

## 2022-03-18 ENCOUNTER — Other Ambulatory Visit: Payer: Self-pay

## 2022-03-18 ENCOUNTER — Other Ambulatory Visit: Payer: Self-pay | Admitting: Physician Assistant

## 2022-03-18 DIAGNOSIS — M47816 Spondylosis without myelopathy or radiculopathy, lumbar region: Secondary | ICD-10-CM

## 2022-03-18 MED ORDER — MELOXICAM 15 MG PO TABS
15.0000 mg | ORAL_TABLET | Freq: Every day | ORAL | 0 refills | Status: DC
  Filled 2022-03-18: qty 90, 90d supply, fill #0

## 2022-03-21 ENCOUNTER — Other Ambulatory Visit: Payer: Self-pay

## 2022-03-27 NOTE — Progress Notes (Unsigned)
PROVIDER NOTE: Information contained herein reflects review and annotations entered in association with encounter. Interpretation of such information and data should be left to medically-trained personnel. Information provided to patient can be located elsewhere in the medical record under "Patient Instructions". Document created using STT-dictation technology, any transcriptional errors that may result from process are unintentional.    Patient: Denise Burns  Service Category: E/M  Provider: [name], MD  DOB: [birth year]  DOS: 03/28/2022  Referring Provider: [name], NP  MRN: [id number]  Specialty: Interventional Pain Management  PCP: [name], NP  Type: Established Patient  Setting: Ambulatory outpatient    Location: Office  Delivery: Face-to-face     HPI  Ms. Kalisha Keadle, a 51 y.o. year old female, is here today because of her No primary diagnosis found.. Ms. Dessert's primary complain today is No chief complaint on file. Last encounter: My last encounter with her was on 12/08/2021. Pertinent problems: Ms. Mcjunkin has Chronic midline low back pain with bilateral sciatica; Chronic low back pain (1ry area of Pain) (Bilateral) (R>L) w/o sciatica; Chronic sacroiliac joint pain (2ry area of Pain) (Right); Chronic pain syndrome; Abnormal MRI, lumbar spine (03/22/2018); Lumbar facet hypertrophy (Multilevel) (Bilateral); Lumbar facet syndrome (Bilateral); DDD (degenerative disc disease), lumbosacral; Spondylolisthesis (8 mm) at L5-S1 level; Lumbar Grade 1 Retrolisthesis (2 mm) of L2/L3; L5-S1 pars defect w/ spondylolisthesis (Bilateral); Lumbar pars defect (L5-S1) (Bilateral); Lumbar foraminal stenosis (Left: L2-3) (Bilateral: L5-S1); Osteoarthritis of sacroiliac joint (Bilateral); Chronic musculoskeletal pain; Neurogenic pain; Osteoarthritis of facet joint of lumbar spine; Greater trochanteric bursitis (Bilateral); Lumbar spondylosis; Spondylosis without myelopathy or  radiculopathy, lumbosacral region; Chronic hip pain (Right); Osteoarthritis of hip (Right); Lumbar radiculitis (L1/L2) (Right); Chronic groin pain (Right); Chronic hip pain (Bilateral); Enthesopathy hip region (Left); Osteoarthritis of hips (Bilateral); Kissing spine syndrome; Chronic low back pain (Midline) w/o sciatica; and Trigger point with back pain (PSIS area) (Right) on their pertinent problem list. Pain Assessment: Severity of   is reported as a  /10. Location:    / . Onset:  . Quality:  . Timing:  . Modifying factor(s):  Marland Kitchen Vitals:  vitals were not taken for this visit.   Reason for encounter:  *** . ***  Pharmacotherapy Assessment  Analgesic: Hydrocodone/APAP 7.5/325 1 tablet p.o. 3 times daily (22.5 mg/day of hydrocodone)  MME/day: 22.5 mg/day.   Monitoring: [place] PMP: PDMP reviewed during this encounter.       Pharmacotherapy: No side-effects or adverse reactions reported. Compliance: No problems identified. Effectiveness: Clinically acceptable.  No notes on file  No results found for: "CBDTHCR" No results found for: "D8THCCBX" No results found for: "D9THCCBX"  UDS:  Summary  Date Value Ref Range Status  01/27/2021 Note  Final    Comment:    ==================================================================== [employer] [facility] (MW) ==================================================================== Test                             Result       Flag       Units  Drug Present and Declared for Prescription Verification   Hydrocodone                    544          EXPECTED   ng/mg creat   Hydromorphone                  282  EXPECTED   ng/mg creat   Dihydrocodeine                 100          EXPECTED   ng/mg creat   Norhydrocodone                 735          EXPECTED   ng/mg creat    Sources of hydrocodone include scheduled prescription medications.    Hydromorphone, dihydrocodeine and norhydrocodone are expected    metabolites of hydrocodone. Hydromorphone and  dihydrocodeine are    also available as scheduled prescription medications.  ==================================================================== Test                      Result    Flag   Units      Ref Range   Creatinine              171              mg/dL      >=20 ==================================================================== Declared Medications:  The flagging and interpretation on this report are based on the  following declared medications.  Unexpected results may arise from  inaccuracies in the declared medications.   **Note: The testing scope of this panel includes these medications:   Hydrocodone (Norco)   **Note: The testing scope of this panel does not include the  following reported medications:   Acetaminophen (Norco)  Azelastine  Fludrocortisone (Florinef)  Fluticasone (Flonase)  Hydrocortisone (Cortef)  Meloxicam  Polyethylene Glycol (Plenvu)  Promethazine  Pseudoephedrine (Sudafed)  Tizanidine ==================================================================== For clinical consultation, please call [phone]. ====================================================================       ROS  Constitutional: Denies any fever or chills Gastrointestinal: No reported hemesis, hematochezia, vomiting, or acute GI distress Musculoskeletal: Denies any acute onset joint swelling, redness, loss of ROM, or weakness Neurological: No reported episodes of acute onset apraxia, aphasia, dysarthria, agnosia, amnesia, paralysis, loss of coordination, or loss of consciousness  Medication Review  Azelastine HCl, HYDROcodone-acetaminophen, Urogesic-Blue, fludrocortisone, fluticasone, hydrocortisone, meloxicam, and pseudoephedrine  History Review  Allergy: Ms. Hauter is allergic to sulfacetamide sodium (acne), tramadol, lisinopril, and sulfa antibiotics. Drug: Ms. Hoggard  reports no history of drug use. Alcohol:  reports that she does not currently use  alcohol. Tobacco:  reports that she quit smoking about 27 years ago. Her smoking use included cigarettes. She has never used smokeless tobacco. Social: Ms. Zelaya  reports that she quit smoking about 27 years ago. Her smoking use included cigarettes. She has never used smokeless tobacco. She reports that she does not currently use alcohol. She reports that she does not use drugs. Medical:  has a past medical history of Addison disease ([place]), Arthritis, Back pain, Complication of anesthesia, and GERD (gastroesophageal reflux disease). Surgical: Ms. Augspurger  has a past surgical history that includes Cholecystectomy (2010); Polypectomy; dental work; Dilation and curettage of uterus (2010); IR NEPHROSTOMY PLACEMENT LEFT (12/14/2020); Nephrolithotomy (Left, 12/14/2020); Wisdom tooth extraction; Diagnostic laparoscopy (2004); Cystoscopy/ureteroscopy/holmium laser/stent placement (Right, 06/28/2021); Cystoscopy w/ ureteral stent removal (Right, 08/09/2021); Cystoscopy w/ retrogrades (Right, 08/09/2021); and Cystoscopy/ureteroscopy/holmium laser/stent placement (Right, 08/09/2021). Family: family history includes Alcohol abuse in her father; Arthritis in her mother; Colon polyps (age of onset: 45) in her maternal grandmother; Hearing loss in her father; High blood pressure in her father and mother; Hyperlipidemia in her father and mother; Hypertension in her mother.  Laboratory Chemistry Profile   Renal Lab Results  Component  Value Date   BUN 19 08/26/2021   CREATININE 0.81 08/26/2021   BCR 20 04/23/2021   GFR 84.36 08/26/2021   GFRAA 86 08/16/2018   GFRNONAA >60 12/15/2020    Hepatic Lab Results  Component Value Date   AST 21 04/23/2021   ALT 27 04/23/2021   ALBUMIN 4.3 04/23/2021   ALKPHOS 57 04/23/2021   LIPASE 30 10/06/2020    Electrolytes Lab Results  Component Value Date   NA 143 08/26/2021   K 4.1 08/26/2021   CL 105 08/26/2021   CALCIUM 9.4 08/26/2021   MG 2.1 08/16/2018    Bone Lab  Results  Component Value Date   VD25OH 13.55 (L) 04/30/2018   25OHVITD1 67 08/16/2018   25OHVITD2 <1.0 08/16/2018   25OHVITD3 67 08/16/2018    Inflammation (CRP: Acute Phase) (ESR: Chronic Phase) Lab Results  Component Value Date   CRP 2 08/16/2018   ESRSEDRATE 37 (H) 08/16/2018   LATICACIDVEN 1.4 10/06/2020         Note: Above Lab results reviewed.  Recent Imaging Review  DG PAIN CLINIC C-ARM 1-60 MIN NO REPORT Fluoro was used, but no Radiologist interpretation will be provided.  Please refer to "NOTES" tab for provider progress note. Note: Reviewed        Physical Exam  General appearance: Well nourished, well developed, and well hydrated. In no apparent acute distress Mental status: Alert, oriented x 3 (person, place, & time)       Respiratory: No evidence of acute respiratory distress Eyes: PERLA Vitals: LMP 12/01/2020 (Approximate)  BMI: Estimated body mass index is 30.04 kg/m as calculated from the following:   Height as of 12/08/21: 5' 4" (1.626 m).   Weight as of 12/08/21: 175 lb (79.4 kg). Ideal: Patient weight not recorded  Assessment   Diagnosis Status  No diagnosis found. Controlled Controlled Controlled   Updated Problems: No problems updated.  Plan of Care  Problem-specific:  No problem-specific Assessment & Plan notes found for this encounter.  Ms. Chasiti Waddington has a current medication list which includes the following long-term medication(s): azelastine, flonase sensimist, hydrocodone-acetaminophen, hydrocodone-acetaminophen, hydrocodone-acetaminophen, and meloxicam.  Pharmacotherapy (Medications Ordered): No orders of the defined types were placed in this encounter.  Orders:  No orders of the defined types were placed in this encounter.  Follow-up plan:   No follow-ups on file.     Interventional Therapies  Risk  Complexity Considerations:   Estimated body mass index is 30.9 kg/m as calculated from the following:   Height as of  this encounter: 5' 4" (1.626 m).   Weight as of this encounter: 180 lb (81.6 kg). WNL   Planned  Pending:      Under consideration:   Diagnostic L2-3 L-FCT BLK #1  Diagnostic bilateral SI joint Blk  Possible bilateral SI joint RFA  Diagnostic bilateral L5 TFESI  Diagnostic left-sided L2 TFESI    Completed:   Therapeutic bilateral lumbar facet MBB x3 (09/08/2020) (4-0) (100/100/60/60)  Therapeutic right lumbar facet RFA x2 (09/09/2021) (4-2) (100/100/90/>75)  Therapeutic left lumbar facet RFA x2 (10/07/2021) (4-0) (100/100/90/>75)  Therapeutic right IA hip inj. x2 (08/20/2020) (5-1) (100/100/80/80)  Therapeutic left IA hip inj. x1 (08/20/2020) (5-1) (100/100/80/80)  Therapeutic bilateral trochanteric bursa inj. x2 (11/25/2021)  (100/100/80/80)    Therapeutic  Palliative (PRN) options:   Palliative lumbar facet MBB   Palliative lumbar facet RFA  Palliative trochanteric bursa inj.      Recent Visits No visits were found meeting these conditions. Showing  recent visits within past 90 days and meeting all other requirements Future Appointments Date Type Provider Dept  03/28/22 Appointment Milinda Pointer, MD [facility]  Showing future appointments within next 90 days and meeting all other requirements  I discussed the assessment and treatment plan with the patient. The patient was provided an opportunity to ask questions and all were answered. The patient agreed with the plan and demonstrated an understanding of the instructions.  Patient advised to call back or seek an in-person evaluation if the symptoms or condition worsens.  Duration of encounter: *** minutes.  Total time on encounter, as per AMA guidelines included both the face-to-face and non-face-to-face time personally spent by the physician and/or other qualified health care professional(s) on the day of the encounter (includes time in activities that require the physician or other qualified health care  professional and does not include time in activities normally performed by clinical staff). Physician's time may include the following activities when performed: preparing to see the patient (eg, review of tests, pre-charting review of records) obtaining and/or reviewing separately obtained history performing a medically appropriate examination and/or evaluation counseling and educating the patient/family/caregiver ordering medications, tests, or procedures referring and communicating with other health care professionals (when not separately reported) documenting clinical information in the electronic or other health record independently interpreting results (not separately reported) and communicating results to the patient/ family/caregiver care coordination (not separately reported)  Note by: [name], MD Date: 03/28/2022; Time: 8:54 AM

## 2022-03-28 ENCOUNTER — Encounter: Payer: Self-pay | Admitting: Pain Medicine

## 2022-03-28 ENCOUNTER — Ambulatory Visit: Payer: BC Managed Care – PPO | Attending: Pain Medicine | Admitting: Pain Medicine

## 2022-03-28 VITALS — BP 143/98 | HR 107 | Temp 97.2°F | Ht 64.0 in | Wt 180.0 lb

## 2022-03-28 DIAGNOSIS — G894 Chronic pain syndrome: Secondary | ICD-10-CM | POA: Insufficient documentation

## 2022-03-28 DIAGNOSIS — M25552 Pain in left hip: Secondary | ICD-10-CM | POA: Diagnosis not present

## 2022-03-28 DIAGNOSIS — M25551 Pain in right hip: Secondary | ICD-10-CM | POA: Diagnosis not present

## 2022-03-28 DIAGNOSIS — G8929 Other chronic pain: Secondary | ICD-10-CM | POA: Insufficient documentation

## 2022-03-28 DIAGNOSIS — M533 Sacrococcygeal disorders, not elsewhere classified: Secondary | ICD-10-CM | POA: Diagnosis not present

## 2022-03-28 DIAGNOSIS — M7062 Trochanteric bursitis, left hip: Secondary | ICD-10-CM | POA: Diagnosis not present

## 2022-03-28 DIAGNOSIS — Z79899 Other long term (current) drug therapy: Secondary | ICD-10-CM | POA: Insufficient documentation

## 2022-03-28 DIAGNOSIS — Z79891 Long term (current) use of opiate analgesic: Secondary | ICD-10-CM | POA: Insufficient documentation

## 2022-03-28 DIAGNOSIS — M7061 Trochanteric bursitis, right hip: Secondary | ICD-10-CM | POA: Diagnosis not present

## 2022-03-28 DIAGNOSIS — M47816 Spondylosis without myelopathy or radiculopathy, lumbar region: Secondary | ICD-10-CM | POA: Insufficient documentation

## 2022-03-28 DIAGNOSIS — M545 Low back pain, unspecified: Secondary | ICD-10-CM | POA: Diagnosis not present

## 2022-03-28 MED ORDER — HYDROCODONE-ACETAMINOPHEN 7.5-325 MG PO TABS: 1.0000 | ORAL_TABLET | Freq: Three times a day (TID) | ORAL | 0 refills | Status: DC | PRN

## 2022-03-28 MED ORDER — HYDROCODONE-ACETAMINOPHEN 7.5-325 MG PO TABS
1.0000 | ORAL_TABLET | Freq: Three times a day (TID) | ORAL | 0 refills | Status: DC | PRN
Start: 2022-05-03 — End: 2022-06-29

## 2022-03-28 NOTE — Patient Instructions (Addendum)
____________________________________________________________________________________________  Pharmacy Shortages of Pain Medication   Introduction Shockingly as it may seem, .  "No U.[place] Supreme Court decision has ever interpreted the Constitution as guaranteeing a right to health care for all Americans." - https://www.healthequityandpolicylab.com/elusive-right-to-health-care-under-us-law  "With respect to human rights, the [place] has no formally codified right to health, nor does it participate in a human rights treaty that specifies a right to health." - Scott J. Schweikart, JD, MBE  Situation By now, most of our patients have had the experience of being told by their pharmacist that they do not have enough medication to cover their prescription. If you have not had this experience, just know that you soon will.  Problem There appears to be a shortage of these medications, either at the national level or locally. This is happening with all pharmacies. When there is not enough medication, patients are offered a partial fill and they are told that they will try to get the rest of the medicine for them at a later time. If they do not have enough for even a partial fill, the pharmacists are telling the patients to call us (the prescribing physicians) to request that we send another prescription to another pharmacy to get the medicine.   This reordering of a controlled substance creates documentation problems where additional paperwork needs to be created to explain why two prescriptions for the same period of time and the same medicine are being prescribed to the same patient. It also creates situations where the last appointment note does not accurately reflect when and what prescriptions were given to a patient. This leads to prescribing errors down the line, in subsequent follow-up visits.   [place] Board [employer]acy ([place]) Research revealed that Board of Pharmacy Rule .1806 (21  [place]AC 46.1806) authorizes pharmacists to the transfer of prescriptions among pharmacies, and it sets forth procedural and recordkeeping requirements for doing so. However, this requires the pharmacist to complete the previously mentioned procedural paperwork to accomplish the transfer. As it turns out, it is much easier for them to have the prescribing physicians do the work.   Possible solutions 1. You can ask your physician to assist you in weaning yourself off these medications. 2. Ask your pharmacy if the medication is in stock, 3 days prior to your refill. 3. If you need a pharmacy change, let us know at your medication management visit. Prescriptions that have already been electronically sent to a pharmacy will not be re-sent to a different pharmacy if your pharmacy of record does not have it in stock. Proper stocking of medication is a pharmacy problem, not a prescriber problem. Work with your pharmacist to solve the problem. 4. Have the [place] State Assembly add a provision to the "STOP ACT" (the law that mandates how controlled substances are prescribed) where there is an exception to the electronic prescribing rule that states that in the event there are shortages of medications the physicians are allowed to use written prescriptions as opposed to electronic ones. This would allow patients to take their prescriptions to a different pharmacy that may have enough medication available to fill the prescription. The problem is that currently there is a law that does not allow for written prescriptions, with the exception of instances where the electronic medical record is down due to technical issues.  5. Have [place] Congress ease the pressure on pharmaceutical companies, allowing them to produce enough quantities of the medication to adequately supply the population. 6. Have pharmacies keep enough   stocks of these medications to cover their client base.  7. Have the [place] State Assembly add  a provision to the "STOP ACT" where they ease the regulations surrounding the transfer of controlled substances between pharmacies, so as to simplify the transfer of supplies. As an alternative, develop a system to allow patients to obtain the remainder of their prescription at another one of their pharmacies or at an associate pharmacy.   How this shortage will affect you.  Understand that this is a pharmacy supply problem, not a prescriber problem. Work with your pharmacy to solve it. The job of the prescriber is to evaluate and monitor the patient for the appropriate indications and use of these medicines. It is not the job of the prescriber to supply the medication or to solve problems with that supply. The responsibility and the choice to obtain the medication resides on the patient. By law, supplying the medication is the job of the pharmacy. It is certainly not the job of the prescriber to solve supply problems.   Due to the above problems we are no longer taking patients to write for their pain medication. Future discussions with your physician may include potentially weaning medications or transitioning to alternatives.  We will be focusing primarily on interventional based pain management. We will continue to evaluate for appropriate indications and we may provide recommendations regarding medication, dose, and schedule, as well as monitoring recommendations, however, we will not be taking over the actual prescribing of these substances. On those patients where we are treating their chronic pain with interventional therapies, exceptions will be considered on a case by case basis. At this time, we will try to continue providing this supplemental service to those patients we have been managing in the past. However, as of August 1st, 2023, we no longer will be sending additional prescriptions to other pharmacies for the purpose of solving their supply problems. Once we send a prescription to a pharmacy,  we will not be resending it again to another pharmacy to cover for their shortages.   What to do. Write as many letters as you can. Recruit the help of family members in writing these letters. Below are some of the places where you can write to make your voice heard. Let them know what the problem is and push them to look for solutions.   Search internet for: "[place] find your legislators" https://www.ncleg.gov/findyourlegislators  Search internet for: "[place] insurance commissioner complaints" https://www.ncdoi.gov/contactscomplaints/assistance-or-file-complaint  Search internet for: "[place] Board of Pharmacy complaints" http://www.ncbop.org/contact.htm  Search internet for: "[facility] pharmacy complaints" Email [facility] Customer Relations https://www.[facility].com/help/email-customer-relations.jsp?callType=store  Search internet for: "[facility] pharmacy customer service complaints" https://www.[facility].com/topic/marketing/contactus/contactus_customerservice.jsp  ____________________________________________________________________________________________  ____________________________________________________________________________________________  Medication Rules  Purpose: To inform patients, and their family members, of our rules and regulations.  Applies to: All patients receiving prescriptions (written or electronic).  Pharmacy of record: Pharmacy where electronic prescriptions will be sent. If written prescriptions are taken to a different pharmacy, please inform the nursing staff. The pharmacy listed in the electronic medical record should be the one where you would like electronic prescriptions to be sent.  Electronic prescriptions: In compliance with the  Strengthen Opioid Misuse Prevention (STOP) Act of 2017 (Session Law 2017-74/H243), effective August 01, 2018, all controlled substances must be electronically prescribed. Calling prescriptions  to the pharmacy will cease to exist.  Prescription refills: Only during scheduled appointments. Applies to all prescriptions.  NOTE: The following applies primarily to controlled substances (Opioid* Pain Medications).     Type of encounter (visit): For patients receiving controlled substances, face-to-face visits are required. (Not an option or up to the patient.)  Patient's responsibilities: Pain Pills: Bring all pain pills to every appointment (except for procedure appointments). Pill Bottles: Bring pills in original pharmacy bottle. Always bring the newest bottle. Bring bottle, even if empty. Medication refills: You are responsible for knowing and keeping track of what medications you take and those you need refilled. The day before your appointment: write a list of all prescriptions that need to be refilled. The day of the appointment: give the list to the admitting nurse. Prescriptions will be written only during appointments. No prescriptions will be written on procedure days. If you forget a medication: it will not be "Called in", "Faxed", or "electronically sent". You will need to get another appointment to get these prescribed. No early refills. Do not call asking to have your prescription filled early. Prescription Accuracy: You are responsible for carefully inspecting your prescriptions before leaving our office. Have the discharge nurse carefully go over each prescription with you, before taking them home. Make sure that your name is accurately spelled, that your address is correct. Check the name and dose of your medication to make sure it is accurate. Check the number of pills, and the written instructions to make sure they are clear and accurate. Make sure that you are given enough medication to last until your next medication refill appointment. Taking Medication: Take medication as prescribed. When it comes to controlled substances, taking less pills or less frequently than prescribed  is permitted and encouraged. Never take more pills than instructed. Never take medication more frequently than prescribed.  Inform other Doctors: Always inform, all of your healthcare providers, of all the medications you take. Pain Medication from other Providers: You are not allowed to accept any additional pain medication from any other Doctor or Healthcare provider. There are two exceptions to this rule. (see below) In the event that you require additional pain medication, you are responsible for notifying us, as stated below. Cough Medicine: Often these contain an opioid, such as codeine or hydrocodone. Never accept or take cough medicine containing these opioids if you are already taking an opioid* medication. The combination may cause respiratory failure and death. Medication Agreement: You are responsible for carefully reading and following our Medication Agreement. This must be signed before receiving any prescriptions from our practice. Safely store a copy of your signed Agreement. Violations to the Agreement will result in no further prescriptions. (Additional copies of our Medication Agreement are available upon request.) Laws, Rules, & Regulations: All patients are expected to follow all Federal and State Laws, Statutes, Rules, & Regulations. Ignorance of the Laws does not constitute a valid excuse.  Illegal drugs and Controlled Substances: The use of illegal substances (including, but not limited to marijuana and its derivatives) and/or the illegal use of any controlled substances is strictly prohibited. Violation of this rule may result in the immediate and permanent discontinuation of any and all prescriptions being written by our practice. The use of any illegal substances is prohibited. Adopted CDC guidelines & recommendations: Target dosing levels will be at or below 60 MME/day. Use of benzodiazepines** is not recommended.  Exceptions: There are only two exceptions to the rule of not  receiving pain medications from other Healthcare Providers. Exception #1 (Emergencies): In the event of an emergency (i.e.: accident requiring emergency care), you are allowed to receive additional pain medication. However, you are responsible for: As soon as   you are able, call our office [phone], at any time of the day or night, and leave a message stating your name, the date and nature of the emergency, and the name and dose of the medication prescribed. In the event that your call is answered by a member of our staff, make sure to document and save the date, time, and the name of the person that took your information.  Exception #2 (Planned Surgery): In the event that you are scheduled by another doctor or dentist to have any type of surgery or procedure, you are allowed (for a period no longer than 30 days), to receive additional pain medication, for the acute post-op pain. However, in this case, you are responsible for picking up a copy of our "Post-op Pain Management for Surgeons" handout, and giving it to your surgeon or dentist. This document is available at our office, and does not require an appointment to obtain it. Simply go to our office during business hours (Monday-Thursday from 8:00 AM to 4:00 PM) (Friday 8:00 AM to 12:00 Noon) or if you have a scheduled appointment with us, prior to your surgery, and ask for it by name. In addition, you are responsible for: calling our office [phone], at any time of the day or night, and leaving a message stating your name, name of your surgeon, type of surgery, and date of procedure or surgery. Failure to comply with your responsibilities may result in termination of therapy involving the controlled substances. Medication Agreement Violation. Following the above rules, including your responsibilities will help you in avoiding a Medication Agreement Violation ("Breaking your Pain Medication Contract").  *Opioid medications include: morphine,  codeine, oxycodone, oxymorphone, hydrocodone, hydromorphone, meperidine, tramadol, tapentadol, buprenorphine, fentanyl, methadone. **Benzodiazepine medications include: diazepam (Valium), alprazolam (Xanax), clonazepam (Klonopine), lorazepam (Ativan), clorazepate (Tranxene), chlordiazepoxide (Librium), estazolam (Prosom), oxazepam (Serax), temazepam (Restoril), triazolam (Halcion) (Last updated: 04/28/2021) ____________________________________________________________________________________________  ____________________________________________________________________________________________  Medication Recommendations and Reminders  Applies to: All patients receiving prescriptions (written and/or electronic).  Medication Rules & Regulations: These rules and regulations exist for your safety and that of others. They are not flexible and neither are we. Dismissing or ignoring them will be considered "non-compliance" with medication therapy, resulting in complete and irreversible termination of such therapy. (See document titled "Medication Rules" for more details.) In all conscience, because of safety reasons, we cannot continue providing a therapy where the patient does not follow instructions.  Pharmacy of record:  Definition: This is the pharmacy where your electronic prescriptions will be sent.  We do not endorse any particular pharmacy, however, we have experienced problems with [facility] not securing enough medication supply for the community. We do not restrict you in your choice of pharmacy. However, once we write for your prescriptions, we will NOT be re-sending more prescriptions to fix restricted supply problems created by your pharmacy, or your insurance.  The pharmacy listed in the electronic medical record should be the one where you want electronic prescriptions to be sent. If you choose to change pharmacy, simply notify our nursing staff.  Recommendations: Keep all of your pain  medications in a safe place, under lock and key, even if you live alone. We will NOT replace lost, stolen, or damaged medication. After you fill your prescription, take 1 week's worth of pills and put them away in a safe place. You should keep a separate, properly labeled bottle for this purpose. The remainder should be kept in the original bottle. Use this as your primary supply, until   it runs out. Once it's gone, then you know that you have 1 week's worth of medicine, and it is time to come in for a prescription refill. If you do this correctly, it is unlikely that you will ever run out of medicine. To make sure that the above recommendation works, it is very important that you make sure your medication refill appointments are scheduled at least 1 week before you run out of medicine. To do this in an effective manner, make sure that you do not leave the office without scheduling your next medication management appointment. Always ask the nursing staff to show you in your prescription , when your medication will be running out. Then arrange for the receptionist to get you a return appointment, at least 7 days before you run out of medicine. Do not wait until you have 1 or 2 pills left, to come in. This is very poor planning and does not take into consideration that we may need to cancel appointments due to bad weather, sickness, or emergencies affecting our staff. DO NOT ACCEPT A "Partial Fill": If for any reason your pharmacy does not have enough pills/tablets to completely fill or refill your prescription, do not allow for a "partial fill". The law allows the pharmacy to complete that prescription within 72 hours, without requiring a new prescription. If they do not fill the rest of your prescription within those 72 hours, you will need a separate prescription to fill the remaining amount, which we will NOT provide. If the reason for the partial fill is your insurance, you will need to talk to the pharmacist  about payment alternatives for the remaining tablets, but again, DO NOT ACCEPT A PARTIAL FILL, unless you can trust your pharmacist to obtain the remainder of the pills within 72 hours.  Prescription refills and/or changes in medication(s):  Prescription refills, and/or changes in dose or medication, will be conducted only during scheduled medication management appointments. (Applies to both, written and electronic prescriptions.) No refills on procedure days. No medication will be changed or started on procedure days. No changes, adjustments, and/or refills will be conducted on a procedure day. Doing so will interfere with the diagnostic portion of the procedure. No phone refills. No medications will be "called into the pharmacy". No Fax refills. No weekend refills. No Holliday refills. No after hours refills.  Remember:  Business hours are:  Monday to Thursday 8:00 AM to 4:00 PM Provider's Schedule: Katti Pelle, MD - Appointments are:  Medication management: Monday and Wednesday 8:00 AM to 4:00 PM Procedure day: Tuesday and Thursday 7:30 AM to 4:00 PM Bilal Lateef, MD - Appointments are:  Medication management: Tuesday and Thursday 8:00 AM to 4:00 PM Procedure day: Monday and Wednesday 7:30 AM to 4:00 PM (Last update: 02/19/2020) ____________________________________________________________________________________________  ____________________________________________________________________________________________  CBD (cannabidiol) & Delta-8 (Delta-8 tetrahydrocannabinol) WARNING  Intro: Cannabidiol (CBD) and tetrahydrocannabinol (THC), are two natural compounds found in plants of the Cannabis genus. They can both be extracted from hemp or cannabis. Hemp and cannabis come from the Cannabis sativa plant. Both compounds interact with your body's endocannabinoid system, but they have very different effects. CBD does not produce the high sensation associated with cannabis. Delta-8  tetrahydrocannabinol, also known as delta-8 THC, is a psychoactive substance found in the Cannabis sativa plant, of which marijuana and hemp are two varieties. THC is responsible for the high associated with the illicit use of marijuana.  Applicable to: All individuals currently taking or considering taking CBD (cannabidiol) and,   more important, all patients taking opioid analgesic controlled substances (pain medication). (Example: oxycodone; oxymorphone; hydrocodone; hydromorphone; morphine; methadone; tramadol; tapentadol; fentanyl; buprenorphine; butorphanol; dextromethorphan; meperidine; codeine; etc.)  Legal status: CBD remains a Schedule I drug prohibited for any use. CBD is illegal with one exception. In the [place], CBD has a limited Food and Drug Administration (FDA) approval for the treatment of two specific types of epilepsy disorders. Only one CBD product has been approved by the FDA for this purpose: "Epidiolex". FDA is aware that some companies are marketing products containing cannabis and cannabis-derived compounds in ways that violate the Federal Food, Drug and Cosmetic Act (FD&C Act) and that may put the health and safety of consumers at risk. The FDA, a Federal agency, has not enforced the CBD status since 2018. UPDATE: (09/17/2021) The Drug Enforcement Agency (DEA) issued a letter stating that "delta" cannabinoids, including Delta-8-THCO and Delta-9-THCO, synthetically derived from hemp do not qualify as hemp and will be viewed as Schedule I drugs. (Schedule I drugs, substances, or chemicals are defined as drugs with no currently accepted medical use and a high potential for abuse. Some examples of Schedule I drugs are: heroin, lysergic acid diethylamide (LSD), marijuana (cannabis), 3,4-methylenedioxymethamphetamine (ecstasy), methaqualone, and peyote.) (https://www.dea.gov)  Legality: Some manufacturers ship CBD products nationally, which is illegal. Often such products are sold  online and are therefore available throughout the country. CBD is openly sold in head shops and health food stores in some states where such sales have not been explicitly legalized. Selling unapproved products with unsubstantiated therapeutic claims is not only a violation of the law, but also can put patients at risk, as these products have not been proven to be safe or effective. Federal illegality makes it difficult to conduct research on CBD.  Reference: "FDA Regulation of Cannabis and Cannabis-Derived Products, Including Cannabidiol (CBD)" - https://www.fda.gov/news-events/public-health-focus/fda-regulation-cannabis-and-cannabis-derived-products-including-cannabidiol-cbd  Warning: CBD is not FDA approved and has not undergo the same manufacturing controls as prescription drugs.  This means that the purity and safety of available CBD may be questionable. Most of the time, despite manufacturer's claims, it is contaminated with THC (delta-9-tetrahydrocannabinol - the chemical in marijuana responsible for the "HIGH").  When this is the case, the THC contaminant will trigger a positive urine drug screen (UDS) test for Marijuana (carboxy-THC). Because a positive UDS for any illicit substance is a violation of our medication agreement, your opioid analgesics (pain medicine) may be permanently discontinued. The FDA recently put out a warning about 5 things that everyone should be aware of regarding Delta-8 THC: Delta-8 THC products have not been evaluated or approved by the FDA for safe use and may be marketed in ways that put the public health at risk. The FDA has received adverse event reports involving delta-8 THC-containing products. Delta-8 THC has psychoactive and intoxicating effects. Delta-8 THC manufacturing often involve use of potentially harmful chemicals to create the concentrations of delta-8 THC claimed in the marketplace. The final delta-8 THC product may have potentially harmful by-products  (contaminants) due to the chemicals used in the process. Manufacturing of delta-8 THC products may occur in uncontrolled or unsanitary settings, which may lead to the presence of unsafe contaminants or other potentially harmful substances. Delta-8 THC products should be kept out of the reach of children and pets.  MORE ABOUT CBD  General Information: CBD was discovered in 1940 and it is a derivative of the cannabis sativa genus plants (Marijuana and Hemp). It is one of the 113 identified substances found in Marijuana.   It accounts for up to 40% of the plant's extract. As of 2018, preliminary clinical studies on CBD included research for the treatment of anxiety, movement disorders, and pain. CBD is available and consumed in multiple forms, including inhalation of smoke or vapor, as an aerosol spray, and by mouth. It may be supplied as an oil containing CBD, capsules, dried cannabis, or as a liquid solution. CBD is thought not to be as psychoactive as THC (delta-9-tetrahydrocannabinol - the chemical in marijuana responsible for the "HIGH"). Studies suggest that CBD may interact with different biological target receptors in the body, including cannabinoid and other neurotransmitter receptors. As of 2018 the mechanism of action for its biological effects has not been determined.  Side-effects  Adverse reactions: Dry mouth, diarrhea, decreased appetite, fatigue, drowsiness, malaise, weakness, sleep disturbances, and others.  Drug interactions: CBC may interact with other medications such as blood-thinners. Because CBD causes drowsiness on its own, it also increases the drowsiness caused by other medications, including antihistamines (such as Benadryl), benzodiazepines (Xanax, Ativan, Valium), antipsychotics, antidepressants and opioids, as well as alcohol and supplements such as kava, melatonin and St. John's Wort. Be cautious with the following combinations:   Brivaracetam (Briviact) Brivaracetam is changed  and broken down by the body. CBD might decrease how quickly the body breaks down brivaracetam. This might increase levels of brivaracetam in the body.  Caffeine Caffeine is changed and broken down by the body. CBD might decrease how quickly the body breaks down caffeine. This might increase levels of caffeine in the body.  Carbamazepine (Tegretol) Carbamazepine is changed and broken down by the body. CBD might decrease how quickly the body breaks down carbamazepine. This might increase levels of carbamazepine in the body and increase its side effects.  Citalopram (Celexa) Citalopram is changed and broken down by the body. CBD might decrease how quickly the body breaks down citalopram. This might increase levels of citalopram in the body and increase its side effects.  Clobazam (Onfi) Clobazam is changed and broken down by the liver. CBD might decrease how quickly the liver breaks down clobazam. This might increase the effects and side effects of clobazam.  Eslicarbazepine (Aptiom) Eslicarbazepine is changed and broken down by the body. CBD might decrease how quickly the body breaks down eslicarbazepine. This might increase levels of eslicarbazepine in the body by a small amount.  Everolimus (Zostress) Everolimus is changed and broken down by the body. CBD might decrease how quickly the body breaks down everolimus. This might increase levels of everolimus in the body.  Lithium Taking higher doses of CBD might increase levels of lithium. This can increase the risk of lithium toxicity.  Medications changed by the liver (Cytochrome P450 1A1 (CYP1A1) substrates) Some medications are changed and broken down by the liver. CBD might change how quickly the liver breaks down these medications. This could change the effects and side effects of these medications.  Medications changed by the liver (Cytochrome P450 1A2 (CYP1A2) substrates) Some medications are changed and broken down by the liver. CBD  might change how quickly the liver breaks down these medications. This could change the effects and side effects of these medications.  Medications changed by the liver (Cytochrome P450 1B1 (CYP1B1) substrates) Some medications are changed and broken down by the liver. CBD might change how quickly the liver breaks down these medications. This could change the effects and side effects of these medications.  Medications changed by the liver (Cytochrome P450 2A6 (CYP2A6) substrates) Some medications are changed and   broken down by the liver. CBD might change how quickly the liver breaks down these medications. This could change the effects and side effects of these medications.  Medications changed by the liver (Cytochrome P450 2B6 (CYP2B6) substrates) Some medications are changed and broken down by the liver. CBD might change how quickly the liver breaks down these medications. This could change the effects and side effects of these medications.  Medications changed by the liver (Cytochrome P450 2C19 (CYP2C19) substrates) Some medications are changed and broken down by the liver. CBD might change how quickly the liver breaks down these medications. This could change the effects and side effects of these medications.  Medications changed by the liver (Cytochrome P450 2C8 (CYP2C8) substrates) Some medications are changed and broken down by the liver. CBD might change how quickly the liver breaks down these medications. This could change the effects and side effects of these medications.  Medications changed by the liver (Cytochrome P450 2C9 (CYP2C9) substrates) Some medications are changed and broken down by the liver. CBD might change how quickly the liver breaks down these medications. This could change the effects and side effects of these medications.  Medications changed by the liver (Cytochrome P450 2D6 (CYP2D6) substrates) Some medications are changed and broken down by the liver. CBD might  change how quickly the liver breaks down these medications. This could change the effects and side effects of these medications.  Medications changed by the liver (Cytochrome P450 2E1 (CYP2E1) substrates) Some medications are changed and broken down by the liver. CBD might change how quickly the liver breaks down these medications. This could change the effects and side effects of these medications.  Medications changed by the liver (Cytochrome P450 3A4 (CYP3A4) substrates) Some medications are changed and broken down by the liver. CBD might change how quickly the liver breaks down these medications. This could change the effects and side effects of these medications.  Medications changed by the liver (Glucuronidated drugs) Some medications are changed and broken down by the liver. CBD might change how quickly the liver breaks down these medications. This could change the effects and side effects of these medications.  Medications that decrease the breakdown of other medications by the liver (Cytochrome P450 2C19 (CYP2C19) inhibitors) CBD is changed and broken down by the liver. Some drugs decrease how quickly the liver changes and breaks down CBD. This could change the effects and side effects of CBD.  Medications that decrease the breakdown of other medications in the liver (Cytochrome P450 3A4 (CYP3A4) inhibitors) CBD is changed and broken down by the liver. Some drugs decrease how quickly the liver changes and breaks down CBD. This could change the effects and side effects of CBD.  Medications that increase breakdown of other medications by the liver (Cytochrome P450 3A4 (CYP3A4) inducers) CBD is changed and broken down by the liver. Some drugs increase how quickly the liver changes and breaks down CBD. This could change the effects and side effects of CBD.  Medications that increase the breakdown of other medications by the liver (Cytochrome P450 2C19 (CYP2C19) inducers) CBD is changed and  broken down by the liver. Some drugs increase how quickly the liver changes and breaks down CBD. This could change the effects and side effects of CBD.  Methadone (Dolophine) Methadone is broken down by the liver. CBD might decrease how quickly the liver breaks down methadone. Taking cannabidiol along with methadone might increase the effects and side effects of methadone.  Rufinamide (Banzel) Rufinamide is   changed and broken down by the body. CBD might decrease how quickly the body breaks down rufinamide. This might increase levels of rufinamide in the body by a small amount.  Sedative medications (CNS depressants) CBD might cause sleepiness and slowed breathing. Some medications, called sedatives, can also cause sleepiness and slowed breathing. Taking CBD with sedative medications might cause breathing problems and/or too much sleepiness.  Sirolimus (Rapamune) Sirolimus is changed and broken down by the body. CBD might decrease how quickly the body breaks down sirolimus. This might increase levels of sirolimus in the body.  Stiripentol (Diacomit) Stiripentol is changed and broken down by the body. CBD might decrease how quickly the body breaks down stiripentol. This might increase levels of stiripentol in the body and increase its side effects.  Tacrolimus (Prograf) Tacrolimus is changed and broken down by the body. CBD might decrease how quickly the body breaks down tacrolimus. This might increase levels of tacrolimus in the body.  Tamoxifen (Soltamox) Tamoxifen is changed and broken down by the body. CBD might affect how quickly the body breaks down tamoxifen. This might affect levels of tamoxifen in the body.  Topiramate (Topamax) Topiramate is changed and broken down by the body. CBD might decrease how quickly the body breaks down topiramate. This might increase levels of topiramate in the body by a small amount.  Valproate Valproic acid can cause liver injury. Taking cannabidiol  with valproic acid might increase the chance of liver injury. CBD and/or valproic acid might need to be stopped, or the dose might need to be reduced.  Warfarin (Coumadin) CBD might increase levels of warfarin, which can increase the risk for bleeding. CBD and/or warfarin might need to be stopped, or the dose might need to be reduced.  Zonisamide Zonisamide is changed and broken down by the body. CBD might decrease how quickly the body breaks down zonisamide. This might increase levels of zonisamide in the body by a small amount. (Last update: 09/29/2021) ____________________________________________________________________________________________  ____________________________________________________________________________________________  Drug Holidays (Slow)  What is a "Drug Holiday"? Drug Holiday: is the name given to the period of time during which a patient stops taking a medication(s) for the purpose of eliminating tolerance to the drug.  Benefits Improved effectiveness of opioids. Decreased opioid dose needed to achieve benefits. Improved pain with lesser dose.  What is tolerance? Tolerance: is the progressive decreased in effectiveness of a drug due to its repetitive use. With repetitive use, the body gets use to the medication and as a consequence, it loses its effectiveness. This is a common problem seen with opioid pain medications. As a result, a larger dose of the drug is needed to achieve the same effect that used to be obtained with a smaller dose.  How long should a "Drug Holiday" last? You should stay off of the pain medicine for at least 14 consecutive days. (2 weeks)  Should I stop the medicine "cold turkey"? No. You should always coordinate with your Pain Specialist so that he/she can provide you with the correct medication dose to make the transition as smoothly as possible.  How do I stop the medicine? Slowly. You will be instructed to decrease the daily amount of  pills that you take by one (1) pill every seven (7) days. This is called a "slow downward taper" of your dose. For example: if you normally take four (4) pills per day, you will be asked to drop this dose to three (3) pills per day for seven (7) days, then to   two (2) pills per day for seven (7) days, then to one (1) per day for seven (7) days, and at the end of those last seven (7) days, this is when the "Drug Holiday" would start.   Will I have withdrawals? By doing a "slow downward taper" like this one, it is unlikely that you will experience any significant withdrawal symptoms. Typically, what triggers withdrawals is the sudden stop of a high dose opioid therapy. Withdrawals can usually be avoided by slowly decreasing the dose over a prolonged period of time. If you do not follow these instructions and decide to stop your medication abruptly, withdrawals may be possible.  What are withdrawals? Withdrawals: refers to the wide range of symptoms that occur after stopping or dramatically reducing opiate drugs after heavy and prolonged use. Withdrawal symptoms do not occur to patients that use low dose opioids, or those who take the medication sporadically. Contrary to benzodiazepine (example: Valium, Xanax, etc.) or alcohol withdrawals ("Delirium Tremens"), opioid withdrawals are not lethal. Withdrawals are the physical manifestation of the body getting rid of the excess receptors.  Expected Symptoms Early symptoms of withdrawal may include: Agitation Anxiety Muscle aches Increased tearing Insomnia Runny nose Sweating Yawning  Late symptoms of withdrawal may include: Abdominal cramping Diarrhea Dilated pupils Goose bumps Nausea Vomiting  Will I experience withdrawals? Due to the slow nature of the taper, it is very unlikely that you will experience any.  What is a slow taper? Taper: refers to the gradual decrease in dose.  (Last update:  02/19/2020) ____________________________________________________________________________________________   ______________________________________________________________________  Preparing for your procedure (without sedation)  Procedure appointments are limited to planned procedures: No Prescription Refills. No disability issues will be discussed. No medication changes will be discussed.  Instructions: Food Intake: Avoid eating anything for at least 4 hours prior to your procedure. Transportation: Unless otherwise stated by your physician, bring a driver. Morning Medicines: Take all of your scheduled morning medications. If you take heart medicine, except for blood thinners, do not forget to take it the morning of the procedure. If your Diastolic (lower reading) is above 100 mmHg, elective cases will be cancelled/rescheduled. Blood thinners: These will need to be stopped for procedures. Notify our staff if you are taking any blood thinners. Depending on which one you take, there will be specific instructions on how and when to stop it. Diabetics on insulin: Notify the staff so that you can be scheduled 1st case in the morning. If your diabetes requires high dose insulin, take only  of your normal insulin dose the morning of the procedure and notify the staff that you have done so. Preventing infections: Shower with an antibacterial soap the morning of your procedure.  Build-up your immune system: Take 1000 mg of Vitamin C with every meal (3 times a day) the day prior to your procedure. Antibiotics: Inform the staff if you have a condition or reason that requires you to take antibiotics before dental procedures. Pregnancy: If you are pregnant, call and cancel the procedure. Sickness: If you have a cold, fever, or any active infections, call and cancel the procedure. Arrival: You must be in the facility at least 30 minutes prior to your scheduled procedure. Children: Do not bring any children  with you. Dress appropriately: There is always a possibility that your clothing may get soiled. Valuables: Do not bring any jewelry or valuables.  Reasons to call and reschedule or cancel your procedure: (Following these recommendations will minimize the risk of a serious complication.)  Surgeries: Avoid having procedures within 2 weeks of any surgery. (Avoid for 2 weeks before or after any surgery). Flu Shots: Avoid having procedures within 2 weeks of a flu shots or . (Avoid for 2 weeks before or after immunizations). Barium: Avoid having a procedure within 7-10 days after having had a radiological study involving the use of radiological contrast. (Myelograms, Barium swallow or enema study). Heart attacks: Avoid any elective procedures or surgeries for the initial 6 months after a "Myocardial Infarction" (Heart Attack). Blood thinners: It is imperative that you stop these medications before procedures. Let us know if you if you take any blood thinner.  Infection: Avoid procedures during or within two weeks of an infection (including chest colds or gastrointestinal problems). Symptoms associated with infections include: Localized redness, fever, chills, night sweats or profuse sweating, burning sensation when voiding, cough, congestion, stuffiness, runny nose, sore throat, diarrhea, nausea, vomiting, cold or Flu symptoms, recent or current infections. It is specially important if the infection is over the area that we intend to treat. Heart and lung problems: Symptoms that may suggest an active cardiopulmonary problem include: cough, chest pain, breathing difficulties or shortness of breath, dizziness, ankle swelling, uncontrolled high or unusually low blood pressure, and/or palpitations. If you are experiencing any of these symptoms, cancel your procedure and contact your primary care physician for an evaluation.  Remember:  Regular Business hours are:  Monday to Thursday 8:00 AM to 4:00  PM  Provider's Schedule: Milinda Pointer, MD:  Procedure days: Tuesday and Thursday 7:30 AM to 4:00 PM  Gillis Santa, MD:  Procedure days: Monday and Wednesday 7:30 AM to 4:00 PM ______________________________________________________________________  ____________________________________________________________________________________________  General Risks and Possible Complications  Patient Responsibilities: It is important that you read this as it is part of your informed consent. It is our duty to inform you of the risks and possible complications associated with treatments offered to you. It is your responsibility as a patient to read this and to ask questions about anything that is not clear or that you believe was not covered in this document.  Patient's Rights: You have the right to refuse treatment. You also have the right to change your mind, even after initially having agreed to have the treatment done. However, under this last option, if you wait until the last second to change your mind, you may be charged for the materials used up to that point.  Introduction: Medicine is not an Chief Strategy Officer. Everything in Medicine, including the lack of treatment(s), carries the potential for danger, harm, or loss (which is by definition: Risk). In Medicine, a complication is a secondary problem, condition, or disease that can aggravate an already existing one. All treatments carry the risk of possible complications. The fact that a side effects or complications occurs, does not imply that the treatment was conducted incorrectly. It must be clearly understood that these can happen even when everything is done following the highest safety standards.  No treatment: You can choose not to proceed with the proposed treatment alternative. The "PRO(s)" would include: avoiding the risk of complications associated with the therapy. The "CON(s)" would include: not getting any of the treatment benefits.  These benefits fall under one of three categories: diagnostic; therapeutic; and/or palliative. Diagnostic benefits include: getting information which can ultimately lead to improvement of the disease or symptom(s). Therapeutic benefits are those associated with the successful treatment of the disease. Finally, palliative benefits are those related to the decrease of the primary symptoms, without  necessarily curing the condition (example: decreasing the pain from a flare-up of a chronic condition, such as incurable terminal cancer).  General Risks and Complications: These are associated to most interventional treatments. They can occur alone, or in combination. They fall under one of the following six (6) categories: no benefit or worsening of symptoms; bleeding; infection; nerve damage; allergic reactions; and/or death. No benefits or worsening of symptoms: In Medicine there are no guarantees, only probabilities. No healthcare provider can ever guarantee that a medical treatment will work, they can only state the probability that it may. Furthermore, there is always the possibility that the condition may worsen, either directly, or indirectly, as a consequence of the treatment. Bleeding: This is more common if the patient is taking a blood thinner, either prescription or over the counter (example: Goody Powders, Fish oil, Aspirin, Garlic, etc.), or if suffering a condition associated with impaired coagulation (example: Hemophilia, cirrhosis of the liver, low platelet counts, etc.). However, even if you do not have one on these, it can still happen. If you have any of these conditions, or take one of these drugs, make sure to notify your treating physician. Infection: This is more common in patients with a compromised immune system, either due to disease (example: diabetes, cancer, human immunodeficiency virus [HIV], etc.), or due to medications or treatments (example: therapies used to treat cancer and  rheumatological diseases). However, even if you do not have one on these, it can still happen. If you have any of these conditions, or take one of these drugs, make sure to notify your treating physician. Nerve Damage: This is more common when the treatment is an invasive one, but it can also happen with the use of medications, such as those used in the treatment of cancer. The damage can occur to small secondary nerves, or to large primary ones, such as those in the spinal cord and brain. This damage may be temporary or permanent and it may lead to impairments that can range from temporary numbness to permanent paralysis and/or brain death. Allergic Reactions: Any time a substance or material comes in contact with our body, there is the possibility of an allergic reaction. These can range from a mild skin rash (contact dermatitis) to a severe systemic reaction (anaphylactic reaction), which can result in death. Death: In general, any medical intervention can result in death, most of the time due to an unforeseen complication. ____________________________________________________________________________________________

## 2022-03-28 NOTE — Progress Notes (Signed)
Nursing Pain Medication Assessment:  Safety precautions to be maintained throughout the outpatient stay will include: orient to surroundings, keep bed in low position, maintain call bell within reach at all times, provide assistance with transfer out of bed and ambulation.  Medication Inspection Compliance: Pill count conducted under aseptic conditions, in front of the patient. Neither the pills nor the bottle was removed from the patient's sight at any time. Once count was completed pills were immediately returned to the patient in their original bottle.  Medication: Hydrocodone/APAP Pill/Patch Count:  18 1/2 of 90 pills remain Pill/Patch Appearance: Markings consistent with prescribed medication Bottle Appearance: Standard pharmacy container. Clearly labeled. Filled Date: 8 / 4 / 2023 Last Medication intake:  TodaySafety precautions to be maintained throughout the outpatient stay will include: orient to surroundings, keep bed in low position, maintain call bell within reach at all times, provide assistance with transfer out of bed and ambulation.

## 2022-03-29 ENCOUNTER — Other Ambulatory Visit: Payer: Self-pay

## 2022-03-31 ENCOUNTER — Other Ambulatory Visit: Payer: Self-pay | Admitting: Nurse Practitioner

## 2022-03-31 DIAGNOSIS — Z1231 Encounter for screening mammogram for malignant neoplasm of breast: Secondary | ICD-10-CM

## 2022-04-05 NOTE — Progress Notes (Signed)
PROVIDER NOTE: Interpretation of information contained herein should be left to medically-trained personnel. Specific patient instructions are provided elsewhere under "Patient Instructions" section of medical record. This document was created in part using STT-dictation technology, any transcriptional errors that may result from this process are unintentional.  Patient: Denise Burns Type: Established DOB: Jul 17, 1971 MRN: [id number] PCP: [name], NP  Service: Procedure DOS: 04/07/2022 Setting: Ambulatory Location: Ambulatory outpatient facility Delivery: Face-to-face Provider: [name], MD Specialty: Interventional Pain Management Specialty designation: 09 Location: Outpatient facility Ref. Prov.: [name], NP    Primary Reason for Visit: Interventional Pain Management Treatment. CC: Hip Pain (bilateral)    Procedure:          Anesthesia, Analgesia, Anxiolysis:  Type: Hip bursa injection          Primary Purpose: Diagnostic Region: Upper (proximal) Femoral Region Level: Hip Joint Target Area: Trochanteric Bursa Approach: Posterolateral approach Laterality: Bilateral  Type: Local Anesthesia Local Anesthetic: Lidocaine 1-2% Sedation: None  Indication(s):  Analgesia Route: Infiltration ([place]/IM) IV Access: N/A   Position: Lateral Decubitus with bad side up   1. Greater trochanteric bursitis (Bilateral)   2. Chronic hip pain (Bilateral)   3. Osteoarthritis of hips (Bilateral)    NAS-11 Pain score:   Pre-procedure: 4 /10   Post-procedure: 0-No pain/10     Pre-op H&P Assessment:  Denise Burns is a 51 y.o. (year old), female patient, seen today for interventional treatment. She  has a past surgical history that includes Cholecystectomy (2010); Polypectomy; dental work; Dilation and curettage of uterus (2010); IR NEPHROSTOMY PLACEMENT LEFT (12/14/2020); Nephrolithotomy (Left, 12/14/2020); Wisdom tooth extraction; Diagnostic laparoscopy (2004);  Cystoscopy/ureteroscopy/holmium laser/stent placement (Right, 06/28/2021); Cystoscopy w/ ureteral stent removal (Right, 08/09/2021); Cystoscopy w/ retrogrades (Right, 08/09/2021); and Cystoscopy/ureteroscopy/holmium laser/stent placement (Right, 08/09/2021). Denise Burns has a current medication list which includes the following prescription(s): azelastine, fludrocortisone, flonase sensimist, hydrocodone-acetaminophen, [START ON 05/03/2022] hydrocodone-acetaminophen, [START ON 06/02/2022] hydrocodone-acetaminophen, hydrocortisone, meloxicam, urogesic-blue, and pseudoephedrine, and the following Facility-Administered Medications: pentafluoroprop-tetrafluoroeth. Her primarily concern today is the Hip Pain (bilateral)  Initial Vital Signs:  Pulse/HCG Rate: 100ECG Heart Rate: 90 Temp: 97.9 F (36.6 C) Resp: 16 BP: (!) 139/95 SpO2: 100 %  BMI: Estimated body mass index is 30.9 kg/m as calculated from the following:   Height as of this encounter: '5\' 4"'$  (1.626 m).   Weight as of this encounter: 180 lb (81.6 kg).  Risk Assessment: Allergies: Reviewed. She is allergic to sulfacetamide sodium (acne), tramadol, lisinopril, and sulfa antibiotics.  Allergy Precautions: None required Coagulopathies: Reviewed. None identified.  Blood-thinner therapy: None at this time Active Infection(s): Reviewed. None identified. Denise Burns is afebrile  Site Confirmation: Denise Burns was asked to confirm the procedure and laterality before marking the site Procedure checklist: Completed Consent: Before the procedure and under the influence of no sedative(s), amnesic(s), or anxiolytics, the patient was informed of the treatment options, risks and possible complications. To fulfill our ethical and legal obligations, as recommended by the American Medical Association's Code of Ethics, I have informed the patient of my clinical impression; the nature and purpose of the treatment or procedure; the risks, benefits, and possible  complications of the intervention; the alternatives, including doing nothing; the risk(s) and benefit(s) of the alternative treatment(s) or procedure(s); and the risk(s) and benefit(s) of doing nothing. The patient was provided information about the general risks and possible complications associated with the procedure. These may include, but are not limited to: failure to achieve desired goals, infection, bleeding,  organ or nerve damage, allergic reactions, paralysis, and death. In addition, the patient was informed of those risks and complications associated to the procedure, such as failure to decrease pain; infection; bleeding; organ or nerve damage with subsequent damage to sensory, motor, and/or autonomic systems, resulting in permanent pain, numbness, and/or weakness of one or several areas of the body; allergic reactions; (i.e.: anaphylactic reaction); and/or death. Furthermore, the patient was informed of those risks and complications associated with the medications. These include, but are not limited to: allergic reactions (i.e.: anaphylactic or anaphylactoid reaction(s)); adrenal axis suppression; blood sugar elevation that in diabetics may result in ketoacidosis or comma; water retention that in patients with history of congestive heart failure may result in shortness of breath, pulmonary edema, and decompensation with resultant heart failure; weight gain; swelling or edema; medication-induced neural toxicity; particulate matter embolism and blood vessel occlusion with resultant organ, and/or nervous system infarction; and/or aseptic necrosis of one or more joints. Finally, the patient was informed that Medicine is not an exact science; therefore, there is also the possibility of unforeseen or unpredictable risks and/or possible complications that may result in a catastrophic outcome. The patient indicated having understood very clearly. We have given the patient no guarantees and we have made no  promises. Enough time was given to the patient to ask questions, all of which were answered to the patient's satisfaction. Denise Burns has indicated that she wanted to continue with the procedure. Attestation: I, the ordering provider, attest that I have discussed with the patient the benefits, risks, side-effects, alternatives, likelihood of achieving goals, and potential problems during recovery for the procedure that I have provided informed consent. Date  Time: 04/07/2022 10:43 AM  Pre-Procedure Preparation:  Monitoring: As per clinic protocol. Respiration, ETCO2, SpO2, BP, heart rate and rhythm monitor placed and checked for adequate function Safety Precautions: Patient was assessed for positional comfort and pressure points before starting the procedure. Time-out: I initiated and conducted the "Time-out" before starting the procedure, as per protocol. The patient was asked to participate by confirming the accuracy of the "Time Out" information. Verification of the correct person, site, and procedure were performed and confirmed by me, the nursing staff, and the patient. "Time-out" conducted as per Joint Commission's Universal Protocol (UP.01.01.01). Time: 1146  Description of Procedure:          Area Prepped: Entire Posterolateral hip area. DuraPrep (Iodine Povacrylex [0.7% available iodine] and Isopropyl Alcohol, 74% w/w) Safety Precautions: Aspiration looking for blood return was conducted prior to all injections. At no point did we inject any substances, as a needle was being advanced. No attempts were made at seeking any paresthesias. Safe injection practices and needle disposal techniques used. Medications properly checked for expiration dates. SDV (single dose vial) medications used. Description of the Procedure: Protocol guidelines were followed. The patient was placed in position over the procedure table. The target area was identified and the area prepped in the usual manner. Skin &  deeper tissues infiltrated with local anesthetic. Appropriate amount of time allowed to pass for local anesthetics to take effect. The procedure needles were then advanced to the target area. Proper needle placement secured. Negative aspiration confirmed. Solution injected in intermittent fashion, asking for systemic symptoms every 0.5cc of injectate. The needles were then removed and the area cleansed, making sure to leave some of the prepping solution back to take advantage of its long term bactericidal properties. Vitals:   04/07/22 1137 04/07/22 1143 04/07/22 1148 04/07/22 1153  BP: Marland Kitchen)  126/94 (!) 139/101 109/80 (!) 131/97  Pulse:      Resp: '11 20 16 13  '$ Temp:      TempSrc:      SpO2: 96% 97% 97% 97%  Weight:      Height:        Start Time: 1146 hrs. End Time: 1151 hrs.           Materials:  Needle(s) Type: Spinal Needle Gauge: 22G Length: 5.0-in Medication(s): Please see orders for medications and dosing details.  Imaging Guidance (Non-Spinal):          Type of Imaging Technique: Fluoroscopy Guidance (Non-Spinal) Indication(s): Assistance in needle guidance and placement for procedures requiring needle placement in or near specific anatomical locations not easily accessible without such assistance. Exposure Time: Please see nurses notes. Contrast: Before injecting any contrast, we confirmed that the patient did not have an allergy to iodine, shellfish, or radiological contrast. Once satisfactory needle placement was completed at the desired level, radiological contrast was injected. Contrast injected under live fluoroscopy. No contrast complications. See chart for type and volume of contrast used. Fluoroscopic Guidance: I was personally present during the use of fluoroscopy. "Tunnel Vision Technique" used to obtain the best possible view of the target area. Parallax error corrected before commencing the procedure. "Direction-depth-direction" technique used to introduce the needle  under continuous pulsed fluoroscopy. Once target was reached, antero-posterior, oblique, and lateral fluoroscopic projection used confirm needle placement in all planes. Images permanently stored in EMR. Interpretation: I personally interpreted the imaging intraoperatively. Adequate needle placement confirmed in multiple planes. Appropriate spread of contrast into desired area was observed. No evidence of afferent or efferent intravascular uptake. Permanent images saved into the patient's record.  Antibiotic Prophylaxis:   Anti-infectives (From admission, onward)    None      Indication(s): None identified  Post-operative Assessment:  Post-procedure Vital Signs:  Pulse/HCG Rate: 10085 Temp: 97.9 F (36.6 C) Resp: 13 BP: (!) 131/97 SpO2: 97 %  EBL: None  Complications: No immediate post-treatment complications observed by team, or reported by patient.  Note: The patient tolerated the entire procedure well. A repeat set of vitals were taken after the procedure and the patient was kept under observation following institutional policy, for this type of procedure. Post-procedural neurological assessment was performed, showing return to baseline, prior to discharge. The patient was provided with post-procedure discharge instructions, including a section on how to identify potential problems. Should any problems arise concerning this procedure, the patient was given instructions to immediately contact us, at any time, without hesitation. In any case, we plan to contact the patient by telephone for a follow-up status report regarding this interventional procedure.  Comments:  No additional relevant information.  Plan of Care  Orders:  Orders Placed This Encounter  Procedures   HIP INJECTION    Purpose: Therapeutic Indication: Hip pain 2ry to Trochanteric Burlitis bilateral (M70.61, M70.62).    Scheduling Instructions:     Procedure: Trochanteric bursa injection     Laterality:  Bilateral     Sedation: Patient's choice.     Timeframe: Today   DG PAIN CLIN[facility] C-ARM 1-60 MIN NO REPORT    Intraoperative interpretation by procedural physician at [facility].    Standing Status:   Standing    Number of Occurrences:   1    Order Specific Question:   Reason for exam:    Answer:   Assistance in needle guidance and placement for procedures requiring needle placement in or  near specific anatomical locations not easily accessible without such assistance.   Informed Consent Details: Physician/Practitioner Attestation; Transcribe to consent form and obtain patient signature    Note: Always confirm laterality of pain with [name], before procedure. Transcribe to consent form and obtain patient signature.    Order Specific Question:   Physician/Practitioner attestation of informed consent for procedure/surgical case    Answer:   I, the physician/practitioner, attest that I have discussed with the patient the benefits, risks, side effects, alternatives, likelihood of achieving goals and potential problems during recovery for the procedure that I have provided informed consent.    Order Specific Question:   Procedure    Answer:   Hip bursa injection    Order Specific Question:   Physician/Practitioner performing the procedure    Answer:   Gurley Climer A. Dossie Arbour, MD    Order Specific Question:   Indication/Reason    Answer:   Hip bursitis   Provide equipment / supplies at bedside    "Block Tray" (Disposable  single use) Needle type: SpinalSpinal Amount/quantity: 2 Size: Regular (3.5-inch) Gauge: 22G    Standing Status:   Standing    Number of Occurrences:   1    Order Specific Question:   Specify    Answer:   Block Tray   Chronic Opioid Analgesic:  Hydrocodone/APAP 7.5/325 1 tablet p.o. 3 times daily (22.5 mg/day of hydrocodone)  MME/day: 22.5 mg/day.   Medications ordered for procedure: Meds ordered this encounter  Medications   lidocaine (XYLOCAINE) 2 %  (with pres) injection 400 mg   pentafluoroprop-tetrafluoroeth (GEBAUERS) aerosol   DISCONTD: lactated ringers infusion   DISCONTD: midazolam (VERSED) 5 MG/5ML injection 0.5-2 mg    Make sure Flumazenil is available in the pyxis when using this medication. If oversedation occurs, administer 0.2 mg IV over 15 sec. If after 45 sec no response, administer 0.2 mg again over 1 min; may repeat at 1 min intervals; not to exceed 4 doses (1 mg)   methylPREDNISolone acetate (DEPO-MEDROL) injection 160 mg   ropivacaine (PF) 2 mg/mL (0.2%) (NAROPIN) injection 8 mL   Medications administered: We administered lidocaine, methylPREDNISolone acetate, and ropivacaine (PF) 2 mg/mL (0.2%).  See the medical record for exact dosing, route, and time of administration.  Follow-up plan:   Return in about 2 weeks (around 04/21/2022) for Proc-day (T,Th), (VV), (PPE).       Interventional Therapies  Risk  Complexity Considerations:   Estimated body mass index is 30.9 kg/m as calculated from the following:   Height as of this encounter: '5\' 4"'$  (1.626 m).   Weight as of this encounter: 180 lb (81.6 kg). WNL   Planned  Pending:      Under consideration:   Diagnostic L2-3 L-FCT BLK #1  Diagnostic bilateral SI joint Blk  Possible bilateral SI joint RFA  Diagnostic bilateral L5 TFESI  Diagnostic left-sided L2 TFESI    Completed:   Therapeutic bilateral lumbar facet MBB x3 (09/08/2020) (4-0) (100/100/60/60)  Therapeutic right lumbar facet RFA x2 (09/09/2021) (4-2) (100/100/90/>75)  Therapeutic left lumbar facet RFA x2 (10/07/2021) (4-0) (100/100/90/>75)  Therapeutic right IA hip inj. x2 (08/20/2020) (5-1) (100/100/80/80)  Therapeutic left IA hip inj. x1 (08/20/2020) (5-1) (100/100/80/80)  Therapeutic bilateral trochanteric bursa inj. x2 (11/25/2021)  (100/100/80/80)    Therapeutic  Palliative (PRN) options:   Palliative lumbar facet MBB   Palliative lumbar facet RFA  Palliative trochanteric bursa inj.        Recent Visits Date Type Provider Dept  03/28/22  Office Visit Milinda Pointer, MD [facility]  Showing recent visits within past 90 days and meeting all other requirements Today's Visits Date Type Provider Dept  04/07/22 Procedure visit Milinda Pointer, MD [facility]  Showing today's visits and meeting all other requirements Future Appointments Date Type Provider Dept  04/19/22 Appointment Milinda Pointer, MD [facility]  06/29/22 Appointment Milinda Pointer, MD [facility]  Showing future appointments within next 90 days and meeting all other requirements  Disposition: Discharge home  Discharge (Date  Time): 04/07/2022; 1156 hrs.   Primary Care Physician: [name], NP Location: [facility] Note by: [name], MD Date: 04/07/2022; Time: 12:41 PM  Disclaimer:  Medicine is not an Chief Strategy Officer. The only guarantee in medicine is that nothing is guaranteed. It is important to note that the decision to proceed with this intervention was based on the information collected from the patient. The Data and conclusions were drawn from the patient's questionnaire, the interview, and the physical examination. Because the information was provided in large part by the patient, it cannot be guaranteed that it has not been purposely or unconsciously manipulated. Every effort has been made to obtain as much relevant data as possible for this evaluation. It is important to note that the conclusions that lead to this procedure are derived in large part from the available data. Always take into account that the treatment will also be dependent on availability of resources and existing treatment guidelines, considered by other Pain Management Practitioners as being common knowledge and practice, at the time of the intervention. For Medico-Legal purposes, it is also important to point out that variation in procedural  techniques and pharmacological choices are the acceptable norm. The indications, contraindications, technique, and results of the above procedure should only be interpreted and judged by a Board-Certified Interventional Pain Specialist with extensive familiarity and expertise in the same exact procedure and technique.

## 2022-04-06 LAB — TOXASSURE SELECT 13 (MW), URINE

## 2022-04-07 ENCOUNTER — Encounter: Payer: Self-pay | Admitting: Pain Medicine

## 2022-04-07 ENCOUNTER — Ambulatory Visit
Admission: RE | Admit: 2022-04-07 | Discharge: 2022-04-07 | Disposition: A | Discharge status: Home / Self Care | Payer: BC Managed Care – PPO | Source: Ambulatory Visit | Attending: Pain Medicine | Admitting: Pain Medicine

## 2022-04-07 ENCOUNTER — Ambulatory Visit: Payer: BC Managed Care – PPO | Attending: Pain Medicine | Admitting: Pain Medicine

## 2022-04-07 VITALS — BP 131/97 | HR 100 | Temp 97.9°F | Resp 13 | Ht 64.0 in | Wt 180.0 lb

## 2022-04-07 DIAGNOSIS — M7062 Trochanteric bursitis, left hip: Secondary | ICD-10-CM | POA: Diagnosis not present

## 2022-04-07 DIAGNOSIS — M25551 Pain in right hip: Secondary | ICD-10-CM | POA: Insufficient documentation

## 2022-04-07 DIAGNOSIS — M7061 Trochanteric bursitis, right hip: Secondary | ICD-10-CM | POA: Diagnosis not present

## 2022-04-07 DIAGNOSIS — M160 Bilateral primary osteoarthritis of hip: Secondary | ICD-10-CM | POA: Insufficient documentation

## 2022-04-07 DIAGNOSIS — M25552 Pain in left hip: Secondary | ICD-10-CM | POA: Insufficient documentation

## 2022-04-07 DIAGNOSIS — G8929 Other chronic pain: Secondary | ICD-10-CM | POA: Insufficient documentation

## 2022-04-07 MED ORDER — METHYLPREDNISOLONE ACETATE 80 MG/ML IJ SUSP
160.0000 mg | Freq: Once | INTRAMUSCULAR | Status: AC
  Administered 2022-04-07: 160 mg via INTRA_ARTICULAR
  Filled 2022-04-07: qty 2

## 2022-04-07 MED ORDER — LIDOCAINE HCL 2 % IJ SOLN
20.0000 mL | Freq: Once | INTRAMUSCULAR | Status: AC
  Administered 2022-04-07: 400 mg
  Filled 2022-04-07: qty 20

## 2022-04-07 MED ORDER — ROPIVACAINE HCL 2 MG/ML IJ SOLN
8.0000 mL | Freq: Once | INTRAMUSCULAR | Status: AC
  Administered 2022-04-07: 4 mL via INTRA_ARTICULAR
  Filled 2022-04-07: qty 20

## 2022-04-07 MED ORDER — LACTATED RINGERS IV SOLN: Freq: Once | INTRAVENOUS | Status: DC

## 2022-04-07 MED ORDER — PENTAFLUOROPROP-TETRAFLUOROETH EX AERO: INHALATION_SPRAY | Freq: Once | CUTANEOUS | Status: DC

## 2022-04-07 MED ORDER — MIDAZOLAM HCL 5 MG/5ML IJ SOLN: 0.5000 mg | Freq: Once | INTRAMUSCULAR | Status: DC

## 2022-04-07 NOTE — Patient Instructions (Signed)

## 2022-04-08 ENCOUNTER — Telehealth: Payer: Self-pay

## 2022-04-08 NOTE — Telephone Encounter (Signed)
Post procedure phone call.  LM 

## 2022-04-11 ENCOUNTER — Ambulatory Visit
Admission: RE | Admit: 2022-04-11 | Discharge: 2022-04-11 | Disposition: A | Discharge status: Home / Self Care | Payer: BC Managed Care – PPO | Source: Ambulatory Visit | Attending: Nurse Practitioner | Admitting: Nurse Practitioner

## 2022-04-11 DIAGNOSIS — Z1231 Encounter for screening mammogram for malignant neoplasm of breast: Secondary | ICD-10-CM

## 2022-04-12 ENCOUNTER — Other Ambulatory Visit: Payer: Self-pay | Admitting: *Deleted

## 2022-04-12 ENCOUNTER — Ambulatory Visit (INDEPENDENT_AMBULATORY_CARE_PROVIDER_SITE_OTHER): Payer: BC Managed Care – PPO | Admitting: Urology

## 2022-04-12 ENCOUNTER — Ambulatory Visit
Admission: RE | Admit: 2022-04-12 | Discharge: 2022-04-12 | Disposition: A | Discharge status: Home / Self Care | Payer: BC Managed Care – PPO | Source: Ambulatory Visit | Attending: Urology | Admitting: Urology

## 2022-04-12 ENCOUNTER — Encounter: Payer: Self-pay | Admitting: Urology

## 2022-04-12 ENCOUNTER — Ambulatory Visit
Admission: RE | Admit: 2022-04-12 | Discharge: 2022-04-12 | Disposition: A | Discharge status: Home / Self Care | Payer: BC Managed Care – PPO | Attending: Urology | Admitting: Urology

## 2022-04-12 VITALS — BP 192/121 | HR 97

## 2022-04-12 DIAGNOSIS — N200 Calculus of kidney: Secondary | ICD-10-CM | POA: Diagnosis not present

## 2022-04-12 DIAGNOSIS — N3000 Acute cystitis without hematuria: Secondary | ICD-10-CM

## 2022-04-12 DIAGNOSIS — R109 Unspecified abdominal pain: Secondary | ICD-10-CM | POA: Diagnosis not present

## 2022-04-12 DIAGNOSIS — Z87442 Personal history of urinary calculi: Secondary | ICD-10-CM

## 2022-04-12 MED ORDER — TAMSULOSIN HCL 0.4 MG PO CAPS: 0.4000 mg | ORAL_CAPSULE | Freq: Every day | ORAL | 0 refills | Status: DC

## 2022-04-12 MED ORDER — CEPHALEXIN 500 MG PO CAPS: 500.0000 mg | ORAL_CAPSULE | Freq: Three times a day (TID) | ORAL | 0 refills | Status: AC

## 2022-04-12 NOTE — Progress Notes (Signed)
04/12/2022 2:41 PM   Denise Burns [birth year] [id number]  Referring provider: [name], NP [place],  [place] [address]  Chief Complaint  Patient presents with   Nephrolithiasis    HPI: 52-year female with a personal stray of nephrolithiasis who presents today for further evaluation of cute onset right flank pain.  She reports that she woke up with like a pinching severe sensation in her right mid lower back.  It was uncomfortable she had to take 2 oxycodone's and the pain subsided.  She had no associated nausea or vomiting.  She is having some urgency frequency symptoms but no dysuria or gross hematuria.  No fevers or chills.  Episode felt similar to previous stone episodes and she was worried.  KUB today did not show any obvious stone burden, punctate stone in the right lower pole as well as a pronounced L4 transverse process which looks similar to previous scout imaging from CT scan.  Phleboliths in right pelvis x2.  Final radiologic interpretation is pending.   PMH: Past Medical History:  Diagnosis Date   Addison disease ([place])    Arthritis    Back pain    Complication of anesthesia    nausea   GERD (gastroesophageal reflux disease)     Surgical History: Past Surgical History:  Procedure Laterality Date   CHOLECYSTECTOMY  2010   CYSTOSCOPY W/ RETROGRADES Right 08/09/2021   Procedure: CYSTOSCOPY WITH RETROGRADE PYELOGRAM;  Surgeon: Hollice Espy, MD;  Location: [facility];  Service: Urology;  Laterality: Right;   CYSTOSCOPY W/ URETERAL STENT REMOVAL Right 08/09/2021   Procedure: CYSTOSCOPY WITH STENT REMOVAL;  Surgeon: Hollice Espy, MD;  Location: [facility];  Service: Urology;  Laterality: Right;   CYSTOSCOPY/URETEROSCOPY/HOLMIUM LASER/STENT PLACEMENT Right 06/28/2021   Procedure: CYSTOSCOPY/URETEROSCOPY/STENT PLACEMENT;  Surgeon: Hollice Espy, MD;  Location: [facility];  Service: Urology;  Laterality: Right;    CYSTOSCOPY/URETEROSCOPY/HOLMIUM LASER/STENT PLACEMENT Right 08/09/2021   Procedure: CYSTOSCOPY/URETEROSCOPY/HOLMIUM LASER/STENT PLACEMENT;  Surgeon: Hollice Espy, MD;  Location: [facility];  Service: Urology;  Laterality: Right;   dental work     DIAGNOSTIC LAPAROSCOPY  2004   endometriosis dx   DILATION AND CURETTAGE OF UTERUS  2010   IR NEPHROSTOMY PLACEMENT LEFT  12/14/2020   NEPHROLITHOTOMY Left 12/14/2020   Procedure: NEPHROLITHOTOMY PERCUTANEOUS;  Surgeon: Hollice Espy, MD;  Location: [facility];  Service: Urology;  Laterality: Left;   POLYPECTOMY     WISDOM TOOTH EXTRACTION      Home Medications:  Allergies as of 04/12/2022       Reactions   Sulfacetamide Sodium (acne)    Other reaction(s): sores   Tramadol Other (See Comments)   Other reaction(s): muscle tension   Lisinopril Rash, Cough   Sulfa Antibiotics Other (See Comments)   Blisters in mouth         Medication List        Accurate as of April 12, 2022  2:41 PM. If you have any questions, ask your nurse or doctor.          Azelastine HCl 137 MCG/SPRAY Soln Place 1 spray into both nostrils 2 (two) times daily.   Flonase Sensimist 27.5 MCG/SPRAY nasal spray Generic drug: fluticasone Place 1 spray into the nose in the morning and at bedtime.   fludrocortisone 0.1 MG tablet Commonly known as: FLORINEF TAKE 1/2 TABLET BY MOUTH DAILY   HYDROcodone-acetaminophen 7.5-325 MG tablet Commonly known as: NORCO Take 1 tablet by mouth every 8 (eight) hours as  needed for severe pain. Must last 30 days.   HYDROcodone-acetaminophen 7.5-325 MG tablet Commonly known as: NORCO Take 1 tablet by mouth every 8 (eight) hours as needed for severe pain. Must last 30 days. Start taking on: May 03, 2022   HYDROcodone-acetaminophen 7.5-325 MG tablet Commonly known as: NORCO Take 1 tablet by mouth every 8 (eight) hours as needed for severe pain. Must last 30 days. Start taking on: June 02, 2022   hydrocortisone 10  MG tablet Commonly known as: CORTEF Take 1 and 1/2 tablets by mouth daily with breakfast and 1/2 tablet daily in the evening   meloxicam 15 MG tablet Commonly known as: MOBIC TAKE 1 TABLET BY MOUTH DAILY   pseudoephedrine 30 MG tablet Commonly known as: SUDAFED Take 30 mg by mouth every 12 (twelve) hours as needed for congestion.   Urogesic-Blue 81.6 MG Tabs Take 1 tablet (81.6 mg total) by mouth 4 (four) times daily as needed.        Allergies:  Allergies  Allergen Reactions   Sulfacetamide Sodium (Acne)     Other reaction(s): sores   Tramadol Other (See Comments)    Other reaction(s): muscle tension   Lisinopril Rash and Cough   Sulfa Antibiotics Other (See Comments)    Blisters in mouth     Family History: Family History  Problem Relation Age of Onset   Arthritis Mother    Hyperlipidemia Mother    Hypertension Mother    High blood pressure Mother    Lung cancer Mother    Liver cancer Mother    Brain cancer Mother    Alcohol abuse Father    Hearing loss Father    Hyperlipidemia Father    High blood pressure Father    Colon polyps Maternal Grandmother 67   Colon cancer Neg Hx    Esophageal cancer Neg Hx    Stomach cancer Neg Hx    Rectal cancer Neg Hx     Social History:  reports that she quit smoking about 27 years ago. Her smoking use included cigarettes. She has never used smokeless tobacco. She reports that she does not currently use alcohol. She reports that she does not use drugs.   Physical Exam: BP (!) 192/121   Pulse 97   LMP 12/01/2020 (Approximate)   Constitutional:  Alert and oriented, No acute distress.  No acute distress. HEENT: [place] AT, moist mucus membranes.  Trachea midline, no masses. Neurologic: Grossly intact, no focal deficits, moving all 4 extremities. Psychiatric: Normal mood and affect.  Laboratory Data: Lab Results  Component Value Date   WBC 7.0 04/23/2021   HGB 14.3 04/23/2021   HCT 42.8 04/23/2021   MCV 93 04/23/2021    PLT 184 04/23/2021    Lab Results  Component Value Date   CREATININE 0.81 08/26/2021    Lab Results  Component Value Date   HGBA1C 6.0 08/26/2021    Urinalysis Urinalysis today with 11-30 WBCs and RBCs, greater than 10 epithelials and many bacteria.  Nitrate negative.   Assessment & Plan:    1. Right flank pain KUB today is fairly unremarkable, waiting radiologic interpretation but there is no obvious ureteral stone burden  We discussed that if her pain worsens or persist, would recommend noncontrast on CT scan  In the interim, she will push fluids, start Flomax for possible presumed very small stone and precautions reviewed including indications for more urgent/emergent evaluation discussed.  She is agreeable this plan. - Urinalysis, Complete - CULTURE, URINE COMPREHENSIVE  2. History of nephrolithiasis As above  3. Acute cystitis without hematuria Urinalysis today is somewhat suspicious, possible underlying cystitis.  We will go ahead and treat this in light of her flank pain and possible stone episode as above.  Keflex 3 times a day x7 days, urine culture sent we will adjust as needed  Return if symptoms fail to improve or worsen   Hollice Espy, MD  [facility] [address] [place], [place] [address] [phone]

## 2022-04-12 NOTE — Progress Notes (Signed)
Negative mammogram. Repeat in one year

## 2022-04-13 ENCOUNTER — Other Ambulatory Visit: Payer: Self-pay

## 2022-04-13 LAB — MICROSCOPIC EXAMINATION: Epithelial Cells (non renal): >10 /hpf — AB (ref 0–10)

## 2022-04-13 LAB — URINALYSIS, COMPLETE
Bilirubin, UA: NEGATIVE
Glucose, UA: NEGATIVE
Ketones, UA: NEGATIVE
Nitrite, UA: NEGATIVE
Protein,UA: NEGATIVE
Specific Gravity, UA: 1.015 (ref 1.005–1.030)
Urobilinogen, Ur: 0.2 mg/dL (ref 0.2–1.0)
pH, UA: 6.5 (ref 5.0–7.5)

## 2022-04-13 MED ORDER — TAMSULOSIN HCL 0.4 MG PO CAPS: 0.4000 mg | ORAL_CAPSULE | Freq: Every day | ORAL | 0 refills | Status: DC

## 2022-04-15 LAB — CULTURE, URINE COMPREHENSIVE

## 2022-04-18 NOTE — Progress Notes (Unsigned)
Patient: Denise Burns  Service Category: E/M  Provider: [name], MD  DOB: 07-11-1971  DOS: 04/19/2022  Location: Office  MRN: [id number]  Setting: Ambulatory outpatient  Referring Provider: [name], NP  Type: Established Patient  Specialty: Interventional Pain Management  PCP: [name], NP  Location: Remote location  Delivery: TeleHealth     Virtual Encounter - Pain Management PROVIDER NOTE: Information contained herein reflects review and annotations entered in association with encounter. Interpretation of such information and data should be left to medically-trained personnel. Information provided to patient can be located elsewhere in the medical record under "Patient Instructions". Document created using STT-dictation technology, any transcriptional errors that may result from process are unintentional.    Contact & Pharmacy Preferred: [phone] Home: [phone] (home) Mobile: [phone] (mobile) E-mail: [id number] [address]ise [place], [address] [place] [address] Phone: [phone] Fax: [phone]   Pre-screening  Denise Burns offered "in-person" vs "virtual" encounter. She indicated preferring virtual for this encounter.   Reason COVID-19*  Social distancing based on CDC and AMA recommendations.   I contacted Denise Burns on 04/19/2022 via telephone.      I clearly identified myself as [name], MD. I verified that I was speaking with the correct person using two identifiers (Name: Denise Burns, and date of birth: 1970-12-22).  Consent I sought verbal advanced consent from Denise Burns for virtual visit interactions. I informed Denise Burns of possible security and privacy concerns, risks, and limitations associated with providing "not-in-person" medical evaluation and management services. I also informed Denise Burns of the availability of "in-person" appointments. Finally, I informed her that there would be a charge for the virtual visit and that she could be  personally, fully or partially, financially responsible for it. Denise Burns expressed understanding and agreed to proceed.   Historic Elements   Denise Burns is a 51 y.o. year old, female patient evaluated today after our last contact on 04/07/2022. Denise Burns  has a past medical history of Addison disease ([place]), Arthritis, Back pain, Complication of anesthesia, and GERD (gastroesophageal reflux disease). She also  has a past surgical history that includes Cholecystectomy (2010); Polypectomy; dental work; Dilation and curettage of uterus (2010); IR NEPHROSTOMY PLACEMENT LEFT (12/14/2020); Nephrolithotomy (Left, 12/14/2020); Wisdom tooth extraction; Diagnostic laparoscopy (2004); Cystoscopy/ureteroscopy/holmium laser/stent placement (Right, 06/28/2021); Cystoscopy w/ ureteral stent removal (Right, 08/09/2021); Cystoscopy w/ retrogrades (Right, 08/09/2021); and Cystoscopy/ureteroscopy/holmium laser/stent placement (Right, 08/09/2021). Denise Burns has a current medication list which includes the following prescription(s): azelastine, cephalexin, fludrocortisone, flonase sensimist, hydrocodone-acetaminophen, [START ON 05/03/2022] hydrocodone-acetaminophen, [START ON 06/02/2022] hydrocodone-acetaminophen, hydrocortisone, meloxicam, urogesic-blue, pseudoephedrine, and tamsulosin. She  reports that she quit smoking about 27 years ago. Her smoking use included cigarettes. She has never used smokeless tobacco. She reports that she does not currently use alcohol. She reports that she does not use drugs. Denise Burns is allergic to sulfacetamide sodium (acne), tramadol, lisinopril, and sulfa antibiotics.   HPI  Today, she is being contacted for a post-procedure assessment.  Post-procedure evaluation    Procedure:          Anesthesia, Analgesia, Anxiolysis:   Type: Hip bursa injection          Primary Purpose: Diagnostic Region: Upper (proximal) Femoral Region Level: Hip Joint Target Area: Trochanteric Bursa Approach: Posterolateral approach Laterality: Bilateral  Type: Local Anesthesia Local Anesthetic: Lidocaine  1-2% Sedation: None  Indication(s):  Analgesia Route: Infiltration ([place]/IM) IV Access: N/A   Position: Lateral Decubitus with bad side up   1. Greater trochanteric bursitis (Bilateral)   2. Chronic hip pain (Bilateral)   3. Osteoarthritis of hips (Bilateral)    NAS-11 Pain score:   Pre-procedure: 4 /10   Post-procedure: 0-No pain/10      Effectiveness:  Initial hour after procedure: 100 %. Subsequent 4-6 hours post-procedure: 100 %. Analgesia past initial 6 hours: 90 %. Ongoing improvement:  Analgesic: The patient indicates currently having an ongoing 90% improvement of her hip pain. Function: Denise Burns reports improvement in function ROM: Denise Burns reports improvement in ROM  Pharmacotherapy Assessment   Opioid Analgesic: Hydrocodone/APAP 7.5/325 1 tablet p.o. 3 times daily (22.5 mg/day of hydrocodone)  MME/day: 22.5 mg/day.   Monitoring: [place] PMP: PDMP reviewed during this encounter.       Pharmacotherapy: No side-effects or adverse reactions reported. Compliance: No problems identified. Effectiveness: Clinically acceptable. Plan: Refer to "POC". UDS:  Summary  Date Value Ref Range Status  03/28/2022 Note  Final    Comment:    ==================================================================== [employer] Select 13 (MW) ==================================================================== Test                             Result       Flag       Units  Drug Present and Declared for Prescription Verification   Hydrocodone                    2442         EXPECTED   ng/mg creat   Hydromorphone                  467          EXPECTED   ng/mg creat   Dihydrocodeine                 227          EXPECTED    ng/mg creat   Norhydrocodone                 2628         EXPECTED   ng/mg creat    Sources of hydrocodone include scheduled prescription medications.    Hydromorphone, dihydrocodeine and norhydrocodone are expected    metabolites of hydrocodone. Hydromorphone and dihydrocodeine are    also available as scheduled prescription medications.  ==================================================================== Test                      Result    Flag   Units      Ref Range   Creatinine              105              mg/dL      >=20 ==================================================================== Declared Medications:  The flagging and interpretation on this report are based on the  following declared medications.  Unexpected results may arise from  inaccuracies in the declared medications.   **Note: The testing scope of this panel includes these medications:   Hydrocodone (Norco)   **Note: The testing scope of this panel does not include the  following reported medications:   Acetaminophen (Norco)  Azelastine (Astelin)  Fludrocortisone (Florinef)  Fluticasone (Flonase)  Hydrocortisone (Cortef)  Hyoscyamine  Meloxicam (Mobic)  Methenamine  Methylene Blue  Pseudoephedrine (Sudafed)  Sodium  Biphosphate ==================================================================== For clinical consultation, please call [phone]. ====================================================================    No results found for: "CBDTHCR", "D8THCCBX", "D9THCCBX"   Laboratory Chemistry Profile   Renal Lab Results  Component Value Date   BUN 19 08/26/2021   CREATININE 0.81 08/26/2021   BCR 20 04/23/2021   GFR 84.36 08/26/2021   GFRAA 86 08/16/2018   GFRNONAA >60 12/15/2020    Hepatic Lab Results  Component Value Date   AST 21 04/23/2021   ALT 27 04/23/2021   ALBUMIN 4.3 04/23/2021   ALKPHOS 57 04/23/2021   LIPASE 30 10/06/2020    Electrolytes Lab Results  Component Value  Date   NA 143 08/26/2021   K 4.1 08/26/2021   CL 105 08/26/2021   CALCIUM 9.4 08/26/2021   MG 2.1 08/16/2018    Bone Lab Results  Component Value Date   VD25OH 13.55 (L) 04/30/2018   25OHVITD1 67 08/16/2018   25OHVITD2 <1.0 08/16/2018   25OHVITD3 67 08/16/2018    Inflammation (CRP: Acute Phase) (ESR: Chronic Phase) Lab Results  Component Value Date   CRP 2 08/16/2018   ESRSEDRATE 37 (H) 08/16/2018   LATICACIDVEN 1.4 10/06/2020         Note: Above Lab results reviewed.  Imaging  Abdomen 1 view (KUB) CLINICAL DATA:  Right kidney pain  EXAM: ABDOMEN - 1 VIEW  COMPARISON:  Renal ultrasound 08/31/2021  FINDINGS: Surgical clips in the right upper quadrant. Nonobstructed gas pattern with moderate stool. Faint 7 mm calcification overlying left kidney. Faint 4 mm calcification in the region of right kidney. No pelvic or paraspinal calcifications.  IMPRESSION: Bilateral kidney stones.  Electronically Signed   By: Donavan Foil M.D.   On: 04/13/2022 20:33  Assessment  The primary encounter diagnosis was Chronic hip pain (Bilateral). Diagnoses of Greater trochanteric bursitis (Bilateral), Chronic pain syndrome, and Chronic low back pain (1ry area of Pain) (Bilateral) (R>L) w/o sciatica were also pertinent to this visit.  Plan of Care  Problem-specific:  No problem-specific Assessment & Plan notes found for this encounter.  Denise Burns has a current medication list which includes the following long-term medication(s): azelastine, flonase sensimist, hydrocodone-acetaminophen, [START ON 05/03/2022] hydrocodone-acetaminophen, [START ON 06/02/2022] hydrocodone-acetaminophen, and meloxicam.  Pharmacotherapy (Medications Ordered): No orders of the defined types were placed in this encounter.  Orders:  No orders of the defined types were placed in this encounter.  Follow-up plan:   No follow-ups on file.     Interventional Therapies  Risk  Complexity  Considerations:   Estimated body mass index is 30.9 kg/m as calculated from the following:   Height as of this encounter: 5' 4"  (1.626 m).   Weight as of this encounter: 180 lb (81.6 kg). WNL   Planned  Pending:      Under consideration:   Diagnostic L2-3 L-FCT BLK #1  Diagnostic bilateral SI joint Blk  Possible bilateral SI joint RFA  Diagnostic bilateral L5 TFESI  Diagnostic left-sided L2 TFESI    Completed:   Therapeutic bilateral lumbar facet MBB x3 (09/08/2020) (4-0) (100/100/60/60)  Therapeutic right lumbar facet RFA x2 (09/09/2021) (4-2) (100/100/90/>75)  Therapeutic left lumbar facet RFA x2 (10/07/2021) (4-0) (100/100/90/>75)  Therapeutic right IA hip inj. x2 (08/20/2020) (5-1) (100/100/80/80)  Therapeutic left IA hip inj. x1 (08/20/2020) (5-1) (100/100/80/80)  Therapeutic bilateral trochanteric bursa inj. x3 (04/07/2022)  (100/100/90/90)    Therapeutic  Palliative (PRN) options:   Palliative lumbar facet MBB   Palliative lumbar facet RFA  Palliative trochanteric bursa inj.  Recent Visits Date Type Provider Dept  04/07/22 Procedure visit Milinda Pointer, MD [facility]  03/28/22 Office Visit Milinda Pointer, MD [facility]  Showing recent visits within past 90 days and meeting all other requirements Today's Visits Date Type Provider Dept  04/19/22 Office Visit Milinda Pointer, MD [facility]  Showing today's visits and meeting all other requirements Future Appointments Date Type Provider Dept  06/29/22 Appointment Milinda Pointer, MD [facility]  Showing future appointments within next 90 days and meeting all other requirements  I discussed the assessment and treatment plan with the patient. The patient was provided an opportunity to ask questions and all were answered. The patient agreed with the plan and demonstrated an understanding of the instructions.  Patient advised to call back or seek an in-person  evaluation if the symptoms or condition worsens.  Duration of encounter: 11 minutes.  Note by: [name], MD Date: 04/19/2022; Time: 11:03 AM

## 2022-04-19 ENCOUNTER — Ambulatory Visit: Payer: BC Managed Care – PPO | Attending: Pain Medicine | Admitting: Pain Medicine

## 2022-04-19 DIAGNOSIS — M7061 Trochanteric bursitis, right hip: Secondary | ICD-10-CM | POA: Diagnosis not present

## 2022-04-19 DIAGNOSIS — M7062 Trochanteric bursitis, left hip: Secondary | ICD-10-CM

## 2022-04-19 DIAGNOSIS — G894 Chronic pain syndrome: Secondary | ICD-10-CM | POA: Diagnosis not present

## 2022-04-19 DIAGNOSIS — M545 Low back pain, unspecified: Secondary | ICD-10-CM | POA: Diagnosis not present

## 2022-04-19 DIAGNOSIS — G8929 Other chronic pain: Secondary | ICD-10-CM

## 2022-04-21 ENCOUNTER — Telehealth: Payer: Self-pay | Admitting: *Deleted

## 2022-04-21 DIAGNOSIS — N200 Calculus of kidney: Secondary | ICD-10-CM

## 2022-04-21 DIAGNOSIS — R109 Unspecified abdominal pain: Secondary | ICD-10-CM

## 2022-04-21 NOTE — Telephone Encounter (Signed)
Pt called triage and stated she is still having the right sided pain, last night for about 2 hours she stated it was really bad but not as bad today. Pt asking if she can proceed with the CT scan as recommended at her OV.  Please advise

## 2022-04-21 NOTE — Telephone Encounter (Signed)
Per Dr. Erlene Quan,  [facility] to order ct stone    Spoke with patient and advised results, CT ordered and radiology scheduling will be calling to schedule.

## 2022-04-21 NOTE — Telephone Encounter (Signed)
Spoke with patient and made her aware order has been placed.

## 2022-04-26 ENCOUNTER — Other Ambulatory Visit: Payer: Self-pay

## 2022-04-26 DIAGNOSIS — H1045 Other chronic allergic conjunctivitis: Secondary | ICD-10-CM | POA: Diagnosis not present

## 2022-04-26 DIAGNOSIS — R0602 Shortness of breath: Secondary | ICD-10-CM | POA: Diagnosis not present

## 2022-04-26 DIAGNOSIS — J3089 Other allergic rhinitis: Secondary | ICD-10-CM | POA: Diagnosis not present

## 2022-04-26 DIAGNOSIS — J342 Deviated nasal septum: Secondary | ICD-10-CM | POA: Diagnosis not present

## 2022-04-26 MED ORDER — CEFDINIR 300 MG PO CAPS
300.0000 mg | ORAL_CAPSULE | Freq: Two times a day (BID) | ORAL | 0 refills | Status: DC
  Filled 2022-04-26: qty 20, 10d supply, fill #0

## 2022-04-26 MED ORDER — METHYLPREDNISOLONE 4 MG PO TABS
ORAL_TABLET | ORAL | 0 refills | Status: DC
  Filled 2022-04-26: qty 10, 4d supply, fill #0

## 2022-05-03 ENCOUNTER — Other Ambulatory Visit: Payer: Self-pay

## 2022-05-03 ENCOUNTER — Ambulatory Visit
Admission: RE | Admit: 2022-05-03 | Discharge: 2022-05-03 | Disposition: A | Discharge status: Home / Self Care | Payer: BC Managed Care – PPO | Source: Ambulatory Visit | Attending: Urology | Admitting: Urology

## 2022-05-03 ENCOUNTER — Ambulatory Visit: Payer: BC Managed Care – PPO

## 2022-05-03 DIAGNOSIS — R109 Unspecified abdominal pain: Secondary | ICD-10-CM | POA: Insufficient documentation

## 2022-05-03 DIAGNOSIS — N200 Calculus of kidney: Secondary | ICD-10-CM | POA: Insufficient documentation

## 2022-05-03 DIAGNOSIS — R3129 Other microscopic hematuria: Secondary | ICD-10-CM | POA: Diagnosis not present

## 2022-05-03 DIAGNOSIS — D1800 Hemangioma unspecified site: Secondary | ICD-10-CM | POA: Diagnosis not present

## 2022-05-03 DIAGNOSIS — K5730 Diverticulosis of large intestine without perforation or abscess without bleeding: Secondary | ICD-10-CM | POA: Diagnosis not present

## 2022-05-09 ENCOUNTER — Ambulatory Visit: Payer: BC Managed Care – PPO | Admitting: Nurse Practitioner

## 2022-05-12 ENCOUNTER — Other Ambulatory Visit: Payer: Self-pay

## 2022-05-16 NOTE — Progress Notes (Unsigned)
Established patient visit   Patient: Denise Burns   DOB: Mar 31, 1971   51 y.o. Female  MRN: [id number] Visit Date: 05/17/2022   No chief complaint on file.  Subjective    HPI  ***   Medications: Outpatient Medications Prior to Visit  Medication Sig   azelastine (ASTELIN) 0.1 % nasal spray Place 1 spray into both nostrils 2 (two) times daily.   cefdinir (OMNICEF) 300 MG capsule Take 1 capsule (300 mg total) by mouth 2 (two) times daily for 10 days.   fludrocortisone (FLORINEF) 0.1 MG tablet TAKE 1/2 TABLET BY MOUTH DAILY   fluticasone (FLONASE SENSIMIST) 27.5 MCG/SPRAY nasal spray Place 1 spray into the nose in the morning and at bedtime.   HYDROcodone-acetaminophen (NORCO) 7.5-325 MG tablet Take 1 tablet by mouth every 8 (eight) hours as needed for severe pain. Must last 30 days.   HYDROcodone-acetaminophen (NORCO) 7.5-325 MG tablet Take 1 tablet by mouth every 8 (eight) hours as needed for severe pain. Must last 30 days.   [START ON 06/02/2022] HYDROcodone-acetaminophen (NORCO) 7.5-325 MG tablet Take 1 tablet by mouth every 8 (eight) hours as needed for severe pain. Must last 30 days.   hydrocortisone (CORTEF) 10 MG tablet Take 1 and 1/2 tablets by mouth daily with breakfast and 1/2 tablet daily in the evening   meloxicam (MOBIC) 15 MG tablet TAKE 1 TABLET BY MOUTH DAILY   Methen-Hyosc-Meth Blue-Na Phos (UROGESIC-BLUE) 81.6 MG TABS Take 1 tablet (81.6 mg total) by mouth 4 (four) times daily as needed.   methylPREDNISolone (MEDROL) 4 MG tablet Take 4 tabs by mouth daily for 1 days, then 3 tabs daily for 1 day, 2 tabs daily for 1 day, then 1 tab daily for 1 day, then stop   pseudoephedrine (SUDAFED) 30 MG tablet Take 30 mg by mouth every 12 (twelve) hours as needed for congestion.    tamsulosin (FLOMAX) 0.4 MG CAPS capsule Take 1 capsule (0.4 mg total) by mouth daily.   No facility-administered medications prior to visit.    Review of Systems  {Labs (Optional):23779}    Objective    There were no vitals filed for this visit. There is no height or weight on file to calculate BMI.  BP Readings from Last 3 Encounters:  04/12/22 (Abnormal) 192/121  04/07/22 (Abnormal) 131/97  03/28/22 (Abnormal) 143/98    Wt Readings from Last 3 Encounters:  04/07/22 180 lb (81.6 kg)  03/28/22 180 lb (81.6 kg)  12/08/21 175 lb (79.4 kg)    Physical Exam  ***  No results found for any visits on 05/17/22.  Assessment & Plan     Problem List Items Addressed This Visit   None    No follow-ups on file.         [name], NP  [facility] [phone] (phone) [phone] (fax)  [place]

## 2022-05-17 ENCOUNTER — Ambulatory Visit (INDEPENDENT_AMBULATORY_CARE_PROVIDER_SITE_OTHER): Payer: BC Managed Care – PPO | Admitting: Nurse Practitioner

## 2022-05-17 ENCOUNTER — Other Ambulatory Visit: Payer: Self-pay

## 2022-05-17 ENCOUNTER — Encounter: Payer: Self-pay | Admitting: Nurse Practitioner

## 2022-05-17 VITALS — BP 135/81 | HR 75 | Ht 64.0 in | Wt 187.0 lb

## 2022-05-17 DIAGNOSIS — R7301 Impaired fasting glucose: Secondary | ICD-10-CM | POA: Diagnosis not present

## 2022-05-17 DIAGNOSIS — F411 Generalized anxiety disorder: Secondary | ICD-10-CM | POA: Insufficient documentation

## 2022-05-17 DIAGNOSIS — R030 Elevated blood-pressure reading, without diagnosis of hypertension: Secondary | ICD-10-CM | POA: Insufficient documentation

## 2022-05-17 DIAGNOSIS — R635 Abnormal weight gain: Secondary | ICD-10-CM | POA: Diagnosis not present

## 2022-05-17 DIAGNOSIS — Z6832 Body mass index (BMI) 32.0-32.9, adult: Secondary | ICD-10-CM

## 2022-05-17 DIAGNOSIS — Z1211 Encounter for screening for malignant neoplasm of colon: Secondary | ICD-10-CM

## 2022-05-17 DIAGNOSIS — E559 Vitamin D deficiency, unspecified: Secondary | ICD-10-CM

## 2022-05-17 DIAGNOSIS — Z0000 Encounter for general adult medical examination without abnormal findings: Secondary | ICD-10-CM

## 2022-05-17 MED ORDER — ESCITALOPRAM OXALATE 5 MG PO TABS
5.0000 mg | ORAL_TABLET | Freq: Every day | ORAL | 1 refills | Status: DC
  Filled 2022-05-17: qty 30, 30d supply, fill #0
  Filled 2022-06-13: qty 4, 4d supply, fill #1
  Filled 2022-06-13: qty 26, 26d supply, fill #1

## 2022-05-18 ENCOUNTER — Inpatient Hospital Stay
Admit: 2022-06-01 | Payer: PRIVATE HEALTH INSURANCE | Source: Home / Self Care | Attending: Oral and Maxillofacial Surgery

## 2022-05-18 ENCOUNTER — Ambulatory Visit: Payer: PRIVATE HEALTH INSURANCE

## 2022-05-18 ENCOUNTER — Other Ambulatory Visit: Payer: Self-pay

## 2022-05-18 ENCOUNTER — Telehealth: Payer: Self-pay

## 2022-05-18 DIAGNOSIS — Z1211 Encounter for screening for malignant neoplasm of colon: Secondary | ICD-10-CM

## 2022-05-18 DIAGNOSIS — M26601 Right temporomandibular joint disorder, unspecified: Secondary | ICD-10-CM

## 2022-05-18 MED ORDER — NA SULFATE-K SULFATE-MG SULF 17.5-3.13-1.6 GM/177ML PO SOLN: 1.0000 | Freq: Once | ORAL | 0 refills | Status: AC

## 2022-05-18 NOTE — Telephone Encounter (Signed)
Gastroenterology Pre-Procedure Review  Request Date: 06/08/22 Requesting Physician: Dr. Vicente Males  PATIENT REVIEW QUESTIONS: The patient responded to the following health history questions as indicated:    1. Are you having any GI issues? no 2. Do you have a personal history of Polyps? no 3. Do you have a family history of Colon Cancer or Polyps? yes (grandmother colon polyps) 4. Diabetes Mellitus? no 5. Joint replacements in the past 12 months? Kidney stone surgery  January 2023 6. Major health problems in the past 3 months?no 7. Any artificial heart valves, MVP, or defibrillator?no    MEDICATIONS & ALLERGIES:    Patient reports the following regarding taking any anticoagulation/antiplatelet therapy:   Plavix, Coumadin, Eliquis, Xarelto, Lovenox, Pradaxa, Brilinta, or Effient? no Aspirin? no  Patient confirms/reports the following medications:  Current Outpatient Medications  Medication Sig Dispense Refill   azelastine (ASTELIN) 0.1 % nasal spray Place 1 spray into both nostrils 2 (two) times daily. 30 mL 1   escitalopram (LEXAPRO) 5 MG tablet Take 1 tablet (5 mg total) by mouth daily. 30 tablet 1   fludrocortisone (FLORINEF) 0.1 MG tablet TAKE 1/2 TABLET BY MOUTH DAILY 45 tablet 3   fluticasone (FLONASE SENSIMIST) 27.5 MCG/SPRAY nasal spray Place 1 spray into the nose in the morning and at bedtime.     HYDROcodone-acetaminophen (NORCO) 7.5-325 MG tablet Take 1 tablet by mouth every 8 (eight) hours as needed for severe pain. Must last 30 days. 90 tablet 0   HYDROcodone-acetaminophen (NORCO) 7.5-325 MG tablet Take 1 tablet by mouth every 8 (eight) hours as needed for severe pain. Must last 30 days. 90 tablet 0   [START ON 06/02/2022] HYDROcodone-acetaminophen (NORCO) 7.5-325 MG tablet Take 1 tablet by mouth every 8 (eight) hours as needed for severe pain. Must last 30 days. 90 tablet 0   hydrocortisone (CORTEF) 10 MG tablet Take 1 and 1/2 tablets by mouth daily with breakfast and 1/2 tablet  daily in the evening 200 tablet 3   meloxicam (MOBIC) 15 MG tablet TAKE 1 TABLET BY MOUTH DAILY 90 tablet 0   Methen-Hyosc-Meth Blue-Na Phos (UROGESIC-BLUE) 81.6 MG TABS Take 1 tablet (81.6 mg total) by mouth 4 (four) times daily as needed. 120 tablet 0   pseudoephedrine (SUDAFED) 30 MG tablet Take 30 mg by mouth every 12 (twelve) hours as needed for congestion.      tamsulosin (FLOMAX) 0.4 MG CAPS capsule Take 1 capsule (0.4 mg total) by mouth daily. 90 capsule 0   No current facility-administered medications for this visit.    Patient confirms/reports the following allergies:  Allergies  Allergen Reactions   Sulfacetamide Sodium (Acne)     Other reaction(s): sores   Tramadol Other (See Comments)    Other reaction(s): muscle tension   Lisinopril Rash and Cough   Sulfa Antibiotics Other (See Comments)    Blisters in mouth     No orders of the defined types were placed in this encounter.   AUTHORIZATION INFORMATION Primary Insurance: 1D#: Group #:  Secondary Insurance: 1D#: Group #:  SCHEDULE INFORMATION: Date:  Time: Location:

## 2022-05-25 ENCOUNTER — Inpatient Hospital Stay: Payer: Commercial Managed Care - Pharmacy Benefit Manager

## 2022-05-25 DIAGNOSIS — Z719 Counseling, unspecified: Secondary | ICD-10-CM

## 2022-06-01 DIAGNOSIS — M26601 Right temporomandibular joint disorder, unspecified: Secondary | ICD-10-CM

## 2022-06-01 MED ADMIN — MICROFIBRILLAR COLL HEMOSTAT EX POWD: Stop: 2022-06-02 | NDC 00998101002

## 2022-06-01 MED ADMIN — ROCURONIUM BROMIDE 50 MG/5ML IV SOLN: INTRAVENOUS | @ 19:00:00 | Stop: 2022-06-01 | NDC 39822420002

## 2022-06-01 MED ADMIN — HYDROMORPHONE HCL 1 MG/ML IJ SOLN: INTRAVENOUS | @ 21:00:00 | Stop: 2022-06-01 | NDC 00409128331

## 2022-06-01 MED ADMIN — PLASMA-LYTE A IV SOLN: INTRAVENOUS | @ 19:00:00 | Stop: 2022-06-01 | NDC 00338022104

## 2022-06-01 MED ADMIN — PROPOFOL 200 MG/20ML IV EMUL: INTRAVENOUS | @ 19:00:00 | Stop: 2022-06-01 | NDC 63323026929

## 2022-06-01 MED ADMIN — ONDANSETRON HCL 4 MG/2ML IJ SOLN: INTRAVENOUS | @ 23:00:00 | Stop: 2022-06-01 | NDC 60505613005

## 2022-06-01 MED ADMIN — CEFAZOLIN SODIUM 1 G IJ SOLR: INTRAVENOUS | @ 19:00:00 | Stop: 2022-06-01 | NDC 60505614205

## 2022-06-01 MED ADMIN — CEFAZOLIN SODIUM 1 G IJ SOLR: INTRAVENOUS | @ 23:00:00 | Stop: 2022-06-01 | NDC 60505614205

## 2022-06-01 MED ADMIN — LIDOCAINE HCL (PF) 1 % IJ SOLN: .4 mL | INTRADERMAL | @ 17:00:00 | Stop: 2022-06-01 | NDC 63323049257

## 2022-06-01 MED ADMIN — FENTANYL CITRATE (PF) 100 MCG/2ML IJ SOLN: INTRAVENOUS | @ 20:00:00 | Stop: 2022-06-01 | NDC 00409909422

## 2022-06-01 MED ADMIN — DEXMEDETOMIDINE HCL 200 MCG/2ML IV SOLN (ANES): INTRAVENOUS | @ 23:00:00 | Stop: 2022-06-01

## 2022-06-01 MED ADMIN — PLASMA-LYTE A IV SOLN: @ 19:00:00 | Stop: 2022-06-01

## 2022-06-01 MED ADMIN — SUGAMMADEX SODIUM 200 MG/2ML IV SOLN: INTRAVENOUS | @ 19:00:00 | Stop: 2022-06-01 | NDC 00006542312

## 2022-06-01 MED ADMIN — LIDOCAINE HCL (CARDIAC) 100 MG/5ML IV SOSY: INTRAVENOUS | @ 19:00:00 | Stop: 2022-06-01 | NDC 76329339001

## 2022-06-01 MED ADMIN — EPINEPHRINE 1:1,000 (1ML) WITH 0.9% NACL (100ML): @ 20:00:00 | Stop: 2022-06-02

## 2022-06-01 MED ADMIN — HYDROMORPHONE HCL 1 MG/ML IJ SOLN: INTRAVENOUS | @ 20:00:00 | Stop: 2022-06-01 | NDC 00409128331

## 2022-06-01 MED ADMIN — HYDROMORPHONE HCL 1 MG/ML IJ SOLN: INTRAVENOUS | @ 22:00:00 | Stop: 2022-06-01 | NDC 00409128331

## 2022-06-01 MED ADMIN — ACETAMINOPHEN 10 MG/ML IV SOLN: 1000 mg | INTRAVENOUS | @ 23:00:00 | Stop: 2022-06-01 | NDC 63323043400

## 2022-06-01 MED ADMIN — FENTANYL CITRATE (PF) 100 MCG/2ML IJ SOLN: INTRAVENOUS | @ 19:00:00 | Stop: 2022-06-01 | NDC 00409909422

## 2022-06-01 MED ADMIN — DEXAMETHASONE SODIUM PHOSPHATE 4 MG/ML IJ SOLN: INTRAVENOUS | @ 19:00:00 | Stop: 2022-06-01 | NDC 67457042312

## 2022-06-01 MED ADMIN — DEXMEDETOMIDINE HCL 200 MCG/2ML IV SOLN (ANES): INTRAVENOUS | @ 19:00:00 | Stop: 2022-06-01

## 2022-06-01 MED ADMIN — KETOROLAC TROMETHAMINE 30 MG/ML IJ SOLN: INTRAVENOUS | @ 23:00:00 | Stop: 2022-06-01 | NDC 72266011825

## 2022-06-01 MED ADMIN — BUPIVACAINE-EPINEPHRINE (PF) 0.5% -1:200000 IJ SOLN: Stop: 2022-06-02 | NDC 63323046237

## 2022-06-01 MED ADMIN — PROPOFOL 200 MG/20ML IV EMUL: INTRAVENOUS | @ 23:00:00 | Stop: 2022-06-01 | NDC 63323026929

## 2022-06-01 NOTE — Op Note
DATE OF OPERATION:  06/01/2022      PREOPERATIVE DIAGNOSIS:  Osteoarthritis of right temporomandibular joint     POSTOPERATIVE DIAGNOSIS:  Osteoarthritis of right temporomandibular joint          NAME OF OPERATION:  1. Right temporomandibular joint condylectomy  2. Right temporomandibular joint alloplastic joint  reconstruction  3. Maxillomandibular fixation                   SURGEON:  Coralyn Mark, DDS [phone])    FIRST ASSISTANT SURGEON:  Deloris Ping, DDS, MD [phone])    ASSISTANT SURGEON:  Buena Irish, DDS [phone])    ANESTHESIA:  General-nasoendotracheal intubation      INDICATIONS FOR SURGERY:  The patient is a 51 y.o. female who has a longstanding history of temporomandibular joint dysfunction on both sides. She has had numerous treatments without resolution.Her most recent CT scan show marked degeneration of the condyles with the right  being more significant than the left. She is brought to the operating room for the reconstruction of her right temporomandibular joint.    DESCRIPTION OF PROCEDURE:    The patient was brought to the operating room and placed on the table in the supine position. After adequate levels of general anesthesia via nasoendotracheal intubation were obtained, a throat pack was placed and Biomet  maxillomandibular fixation screws were placed into the maxilla and the mandible to facilitate maxillomandibular fixation during the placement of the joints and following the completion of the case. A strip of Xeroform gauze was placed in the ear canal to protect the ear drum.    The  area was prepped and draped in a routine fashion for the sterile procedure of condylectomy, and total joint replacement.  Approximately 10 ml of epinephrine solution 1:100,000 was used to infiltrate the preauricular and submandibular area. After waiting an adequate length of time for the hemostatic effect of the epinephrine, an incision was made through the skin and subcutaneous tissue in the through the skin and subcutaneous tissue  in the preauricular area in a standard preauricular fashion. Dissection continued to undermine the tissues in the subcutaneous plane and retract anteriorly for  placement of the joints. Hemostasis was achieved with electrocautery.The pretragal area was explored using a hemostat down to the level of the attachment of the zygomatic arch. The superficial fascia was divided at the most superior aspect down to the level of the temporalis fascia.  Dissection continued undermining the parotideomasseteric fascia to the area of the pretragal space. A clamp was placed across the tissue and an incision was made to elevate the entire bulk of tissue anteriorly with care being taken to look for branches of the facial nerve which were not encountered. The area was undermined completely to allow for adequate exposure of the temporalis fascia as well as the lateral ligament of the temporomandibular joint. After testing for the presence of the facial nerve which was not noted. An incision was made across the top of the arch through the ligament down to bone. The ligament and capsule were divided vertically through the middle and the condylar mass was identified underneath that. Dissection continued around that and the bone was opened further to be able to envelop the condylar neck with retractors. Small bleeding vesseals were ligatew tiwht 4-0 silk.. Once this was  completed, the wound was packed and attention was turned to the submandibular incision.      A standard Risdon incision was made at the angle of the mandible  through the skin and subcutaneous tissue sharply. The wound was undermined and the platysma muscle was divided while looking for branches of the facial nerve which were not encountered. The superficial fascia overlying the angle of the mandible was  further identified and was incised at the angle of the mandible and dissection continued along the inferior border again with was not encountered in that area as well. The facial artery and vein were identified and ligated with 3-0 silk sutures Once the angle of the mandible was identified, the soft tissues of the masseteric sling were elevated off along the posterior border and inferior border to join up to the dissection from the preauricular incision. A sigmoid notch retractor was placed into the sigmoid notch and retractors were placed on either side of the ramus. The sagittal saw was used to osteotomize the condyle and remove it  from the remainder of the mandibular ramus. Bleeding vessels were electrocauterized,  Maxillomandibular fixation was  effected across the screws that were placed earlier with elastics The occlusion was judged to be stable and attention was  turned to the reconstruction. The Biomet microfixation total joint reconstruction set was used to reconstruct the joint . A sizer was placed on the glenoid fossa and adapted with 2 screws to its appropriate position after some bone removal .. A sizer for the condylar segment was placed and it was determined what size would be used. The fossa sizer was removed and a permanent fossa was placed in with screws. The condylar segment implant was  brought to the ramus, and  it was positioned and secured with six screws to the ramus. Following the completion of the procedures the patient was opened  and had a repeatable occlusion.  The wounds were  irrigated with Ancef-Gentamycin solution. Hemostasis was judged to be adequate.. Avitene was placed to assist with further hemostasis. Wound closure was effected with 3-0 Vicryl for the deep tissues, 4-0 Vicryl for the more superficial fascial tissues, 4-0 Monocryl for the subcutaneous layer and 5-0 Prolene for the skin.  The patient was placed into maxillomandibular elastics into good occlusion. The xeroform packing was removed from ear.She was  awakened and extubated and sent to the Recovery Room in good condition.        ESTIMATED BLOOD LOSS:  150 ml    COMPLICATIONS:  None    SPECIMENS:  Right TMJ condyle        Coralyn Mark, DDS [phone])

## 2022-06-01 NOTE — H&P
UPDATED H&P REQUIREMENT    For [facility] and [facility]    WHAT IS THE STATUS OF THE PATIENT'S MOST CURRENT HISTORY AND PHYSICAL?   - The most current H&P was performed within the past 24 hours. No additional updated H&P documentation is necessary.     REFER TO MEDICAL STAFF POLICIES REGARDING PRE-PROCEDURE HISTORY AND PHYSICAL EXAMINATION AND UPDATED H&P REQUIREMENTS BELOW:    [facility] and -[facility] Medical Staff Policy 200 - For Patients Undergoing Procedures Requiring Moderate or Deep Sedation, General Anesthesia or Regional Anesthesia    Contents of a History and Physical Examination (H&P):    The H&P shall consist of chief complaint, history of present illness, allergies and medications, relevant social and family history, past medical history, review of systems and physical examination, and assessment and plan appropriate to the patient's age.    For Patients Undergoing Procedures Requiring Moderate or Deep Sedation, General Anesthesia or Regional Anesthesia:    1. An H&P shall be performed within 24 hours prior to the procedure by a qualified member of the medical staff or designee with appropriate privileges, except as noted in item 2 below.    2. If a complete history and physical was performed within thirty (30) calendar days prior to the patient's admission to the Medical Center for elective surgery, a member of the medical staff assumes the responsibility for the accuracy of the clinical information and will need to document in the medical record within twenty-four (24) hours of admission and prior to surgery or major invasive procedure, that they either attest that the history and physical has been reviewed and accepted, or document an update of the original history and physical relevant to the patient's current clinical status.    3. Providing an H&P for  patients undergoing surgery under local anesthesia is at the discretion of the Attending Physician.     4. When a procedure is performed by a dentist, podiatrist or other practitioner who is not privileged to perform an H&P, the anesthesiologist's assessment immediately prior to the procedure will constitute the 24 hour re-assessment.The dentist, podiatrist or other practitioner who is not privileged to perform an H&P will document the history and physical relevant to the procedure.    5. If the H&P and the written informed consent for the surgery or procedure are not recorded in the patient's medical record prior to surgery, the operation shall not be performed unless the attending physician states in writing that such a delay could lead to an adverse event or irreversible damage to the patient.    6. The above requirements shall not preclude the rendering of emergency medical or surgical care to a patient in dire circumstances.

## 2022-06-01 NOTE — H&P
ORAL & MAXILLOFACIAL SURGERY SERVICE/[id number]  PRE-OPERATIVE HISTORY AND PHYSICAL    PATIENT:  Sarah Martin  MRN:  [id number]  DOB:  [birth year]  DATE OF SERVICE:  05/31/2022    Attending Physician: Maple Mirza, DDS  Planned procedure: Right mandibular condylectomy and coronoidectomy, right temporomandibular joint replacement with prosthetic joint, maxillomandibular fixation    History of Present Illness: Sarah Martin is a 51 y.o. female with a history of depression/anxiety, hyperlipidemia. OSA,  tendonitis who initially presented to [facility] with long history of orthodontic treatment and locked jaw since age 7. Twenty years ago, pt saw physical therapist which helped and was doing well functionally until early 2022. At that time, she noted locking, clicking, and pain. Patient previously has seen an orofacial pain specialist and [facility]FS for conservative management of TMD and had a previous orthotic device but patient still reports pain on right TMJ. CT scan was completed which shows significant degenerative joint disease of right TMJ. Due to radiographic imaging and clinical findings, surgical options were proposed to which the patient agreed to proceed with surgical treatment.         PAST MEDICAL HISTORY     History reviewed. No pertinent past medical history.    History reviewed. No pertinent surgical history.    No Known Allergies     No medications prior to admission.       Family History   Problem Relation Age of Onset   ? Malignant hyperthermia Neg Hx        Social History     Socioeconomic History   ? Marital status: Married   Tobacco Use   ? Smoking status: Never   ? Smokeless tobacco: Never   Substance and Sexual Activity   ? Alcohol use: Not Currently     Comment: 2 glasses of wine monthly at most   ? Drug use: Never       No current facility-administered medications for this encounter.     Current Outpatient Medications   Medication Sig   ? ARIPiprazole 5 mg tablet Take 1 tablet (5 mg total) by mouth daily.   ? atorvastatin 40 mg tablet Take 1 tablet (40 mg total) by mouth daily.   ? buPROPion, XL, 300 mg 24 hr tablet Take 450 mg by mouth daily.   ? gabapentin 100 mg capsule Take 2 capsules (200 mg total) by mouth daily.   ? venlafaxine 150 mg 24 hr capsule Take 75 mg by mouth daily with breakfast.   ? Ubrogepant (UBRELVY) 50 MG TABS Take 1 tablet (50 mg total) by mouth as needed for (migraine, may repeat x 1). (Patient not taking: Reported on 05/25/2022.)        REVIEW OF SYSTEMS   Review of Systems:  14-point review of systems negative except as detailed in HPI   CLINICAL FINDINGS   PHYSICAL EXAM:   GENERAL: Cooperative, no weight change, no change in appetite, no fever or chills  HEAD: NC, NAD  EYES: PERRLA, EOMI grossly, Conjunctiva & Sclera clear  EARS: grossly intact hearing  NOSE: Nares patent  FACIES: Symmetric  MOUTH: dentition grossly intact, occlusion stable, soft tissues WNL  THROAT: clear, MMM  NECK: No Masses , LAD , Thyromegaly noted  NEURO: AAOx3, CN II-XII grossly intact  TMJ:  MIO Active: 62mm.  MIO Passive: 61mm.  Laterotrusive: 15mm R     16mm L.  Protrusive:  15mm.  + R crepitus   + sharp pain on lateral excursion to right  Right side pop with left side deviation while opening.  Ext: moving all ext    LABS:   Lab Results   Component Value Date    WBC 6.1 02/09/2022    HGB 12.4 02/09/2022    HCT 36.4 02/09/2022    MCV 91.9 02/09/2022    PLT 295 02/09/2022       Last 3 BMP    Sodium Lab Results  (Last 360 days)      07/12 0000    Result             140                 Potassium Lab Results  (Last 360 days)      07/12 0000    Result             4.9                 Chloride Lab Results  (Last 360 days)      07/12 0000    Result             105                 Carbon Dioxide Lab Results  (Last 360 days)      07/12 0000    Result             29                 Glucose Lab Results  (Last 360 days)      07/12 0000    Result             94                 Creatinine Lab Results  (Last 360 days) 07/12 0000    Result             0.94                 BUN (Urea Nitrogen) Lab Results  (Last 360 days)      07/12 0000    Result             16                 Calcium Lab Results  (Last 360 days)      07/12 0000    Result             9.7                          Imaging:   Imported CT Face w/o Contrast     ASSESSMENT & PLAN   Assessment/Impression: Sarah Martin is a 51 y.o. female with bilateral degenerative joint disease of the temporomandibular joint (right side more severe than left side) who is planned for total joint replacement of the right TMJ.    Plan:  Risks and benefits of the procedure discussed with patient, who was understanding and signed consent for the planned procedure.  Patient to go to OR for Right mandibular condylectomy and coronoidectomy, right temporomandibular joint replacement with prosthetic joint, and maxillomandibular fixation    The patient's assessment and plan was discussed with oral and maxillofacial surgery attending Dr. Vergia Alcon.  Please page service pager [phone] with any questions    Tollie Pizza. Cherly Hensen, DDS  [facility] Resident

## 2022-06-01 NOTE — Op Note
[facility] Attending Pre-op Note    51 y.o. female with osteoarthritis of right temporomandibular joint.    Will do right condylectomy with alloplastic total joint reconstruction and maxillomandibular fixation..     Risks, benefits and alternatives discussed with patient.     Hyman Hopes. Cathren Harsh, DDS

## 2022-06-02 MED ORDER — CEPHALEXIN 500 MG PO CAPS
500 mg | ORAL_CAPSULE | Freq: Four times a day (QID) | ORAL | 0 refills | 10.00 days | Status: AC
Start: 2022-06-02 — End: ?
  Filled 2022-06-03 (×2): qty 40, 10d supply, fill #0

## 2022-06-02 MED ORDER — ACETAMINOPHEN 500 MG PO TABS
500 mg | ORAL_TABLET | Freq: Four times a day (QID) | ORAL | 0 refills | 14.00 days | Status: AC | PRN
Start: 2022-06-02 — End: ?
  Filled 2022-06-03: qty 56, 14d supply, fill #0

## 2022-06-02 MED ORDER — IBUPROFEN 600 MG PO TABS
600 mg | ORAL_TABLET | Freq: Four times a day (QID) | ORAL | 0 refills | 14.00 days | Status: AC | PRN
Start: 2022-06-02 — End: ?
  Filled 2022-06-03: qty 56, 14d supply, fill #0

## 2022-06-02 MED ORDER — OXYCODONE HCL 5 MG/5ML PO SOLN
5 mg | ORAL | 0 refills | 7.00000 days | Status: AC | PRN
Start: 2022-06-02 — End: ?
  Filled 2022-06-03 (×2): qty 200, 7d supply, fill #0

## 2022-06-02 MED ADMIN — DOCUSATE SODIUM 100 MG PO CAPS: 100 mg | ORAL | @ 16:00:00 | Stop: 2022-06-03 | NDC 00904718361

## 2022-06-02 MED ADMIN — ACETAMINOPHEN 32 MG/ML PO LIQUID: 650 mg | ORAL | @ 07:00:00 | Stop: 2022-06-03 | NDC 00121197121

## 2022-06-02 MED ADMIN — ACETAMINOPHEN 32 MG/ML PO LIQUID: 650 mg | ORAL | @ 13:00:00 | Stop: 2022-06-03 | NDC 00121197121

## 2022-06-02 MED ADMIN — BUPROPION HCL ER (XL) 300 MG PO TB24: 300 mg | ORAL | @ 18:00:00 | Stop: 2022-06-03 | NDC 68084025211

## 2022-06-02 MED ADMIN — SODIUM CHLORIDE 0.9 %/20 MEQ KCL: 125 mL/h | INTRAVENOUS | @ 10:00:00 | Stop: 2022-06-03 | NDC 00338069104

## 2022-06-02 MED ADMIN — HYDROMORPHONE HCL 1 MG/ML IJ SOLN: .2 mg | INTRAVENOUS | @ 02:00:00 | Stop: 2022-06-02 | NDC 00409128331

## 2022-06-02 MED ADMIN — ONDANSETRON HCL 4 MG/2ML IJ SOLN: 4 mg | INTRAVENOUS | @ 06:00:00 | Stop: 2022-06-03 | NDC 60505613000

## 2022-06-02 MED ADMIN — GABAPENTIN 100 MG PO CAPS: 200 mg | ORAL | @ 18:00:00 | Stop: 2022-06-03

## 2022-06-02 MED ADMIN — SODIUM CHLORIDE 0.9 %/20 MEQ KCL: 125 mL/h | INTRAVENOUS | @ 18:00:00 | Stop: 2022-06-03 | NDC 00338069104

## 2022-06-02 MED ADMIN — VENLAFAXINE HCL ER 37.5 MG PO CP24: 75 mg | ORAL | @ 18:00:00 | Stop: 2022-06-03 | NDC 68084069811

## 2022-06-02 MED ADMIN — OXYCODONE HCL 5 MG/5ML PO SOLN: 15 mg | ORAL | @ 16:00:00 | Stop: 2022-06-03 | NDC 64950035455

## 2022-06-02 MED ADMIN — OXYCODONE HCL 5 MG/5ML PO SOLN: 15 mg | ORAL | @ 13:00:00 | Stop: 2022-06-03 | NDC 64950035455

## 2022-06-02 MED ADMIN — CEFAZOLIN SODIUM-DEXTROSE 2-4 GM/100ML-% IV SOLN: 2 g | INTRAVENOUS | @ 16:00:00 | Stop: 2022-06-03 | NDC 00338350841

## 2022-06-02 MED ADMIN — SODIUM CHLORIDE 0.9 %/20 MEQ KCL: 125 mL/h | INTRAVENOUS | @ 04:00:00 | Stop: 2022-06-03 | NDC 00338069104

## 2022-06-02 MED ADMIN — IBUPROFEN 100 MG/5ML PO SUSP: 600 mg | ORAL | @ 07:00:00 | Stop: 2022-06-03 | NDC 45802095243

## 2022-06-02 MED ADMIN — ATORVASTATIN CALCIUM 40 MG PO TABS: 40 mg | ORAL | @ 18:00:00 | Stop: 2022-06-03

## 2022-06-02 MED ADMIN — OXYCODONE HCL 5 MG/5ML PO SOLN: 10 mg | ORAL | @ 04:00:00 | Stop: 2022-06-03 | NDC 00121482705

## 2022-06-02 MED ADMIN — DOCUSATE SODIUM 100 MG PO CAPS: 100 mg | ORAL | @ 03:00:00 | Stop: 2022-06-03

## 2022-06-02 MED ADMIN — ARIPIPRAZOLE 5 MG PO TABS: 5 mg | ORAL | @ 18:00:00 | Stop: 2022-06-03

## 2022-06-02 MED ADMIN — ACETAMINOPHEN 32 MG/ML PO LIQUID: 650 mg | ORAL | @ 03:00:00 | Stop: 2022-06-03 | NDC 00121197121

## 2022-06-02 MED ADMIN — HYDROMORPHONE HCL 1 MG/ML IJ SOLN: .2 mg | INTRAVENOUS | Stop: 2022-06-02 | NDC 00409128331

## 2022-06-02 MED ADMIN — GABAPENTIN 100 MG PO CAPS: 200 mg | ORAL | @ 03:00:00 | Stop: 2022-06-03 | NDC 00904666561

## 2022-06-02 MED ADMIN — IBUPROFEN 100 MG/5ML PO SUSP: 600 mg | ORAL | @ 19:00:00 | Stop: 2022-06-03 | NDC 45802095243

## 2022-06-02 MED ADMIN — ATORVASTATIN CALCIUM 40 MG PO TABS: 40 mg | ORAL | @ 03:00:00 | Stop: 2022-06-03 | NDC 68084009911

## 2022-06-02 MED ADMIN — CEFAZOLIN SODIUM-DEXTROSE 2-4 GM/100ML-% IV SOLN: 2 g | INTRAVENOUS | @ 06:00:00 | Stop: 2022-06-03 | NDC 00338350841

## 2022-06-02 MED ADMIN — ONDANSETRON HCL 4 MG/2ML IJ SOLN: 4 mg | INTRAVENOUS | @ 17:00:00 | Stop: 2022-06-03 | NDC 60505613000

## 2022-06-02 MED ADMIN — SODIUM CHLORIDE 0.9 %/20 MEQ KCL: 125 mL/h | INTRAVENOUS | Stop: 2022-06-03 | NDC 00338069104

## 2022-06-02 MED ADMIN — IBUPROFEN 100 MG/5ML PO SUSP: 600 mg | ORAL | @ 03:00:00 | Stop: 2022-06-03 | NDC 45802095243

## 2022-06-02 MED ADMIN — IBUPROFEN 100 MG/5ML PO SUSP: 600 mg | ORAL | @ 13:00:00 | Stop: 2022-06-03 | NDC 45802095243

## 2022-06-02 MED ADMIN — ARIPIPRAZOLE 5 MG PO TABS: 5 mg | ORAL | @ 04:00:00 | Stop: 2022-07-01 | NDC 50268008811

## 2022-06-02 NOTE — Nursing Note
Discharge Team Note  Requested by: Gabriel Cirri, RN  Needs for Discharge:  Escort [x]   Interventions:  Escorted patient out of the unit [x]    Disposition:  Patient was escorted to [place] parking [x]   Patient was discharged home accompanied by:  Spouse [x] 

## 2022-06-02 NOTE — Progress Notes
Pharmaceutical Services - Meds to [facility] Discharge Medication Reconciliation and Counseling Note    Patient Name: Sarah Martin  Medical Record Number: [id number]  Admit date: 06/01/2022 4:51 PM    Age: 51 y.o.  Sex: female  Allergies: Patient has no known allergies.    Preferred Pharmacy:   [id number] [address], [place] - [phone]  [phone]  [place] [address]         I reconciled the discharge medications and counseled the patient on all new prescriptions. Discharge prescriptions were delivered to bedside. The purpose, potential side effects, storage, missed doses and any special instructions related to each medication was discussed. Medications continued from home were also reviewed with the patient.    Discharge Medication List from AVS:     Changes To My Medications        START taking these medications        Dose Instructions Notes   acetaminophen 500 mg Tabs tablet   500 mg   Take 1 tablet (500 mg total) by mouth every six (6) hours as needed for Pain.      cephalexin 500 mg capsule  Commonly known as: Keflex   500 mg   Take 1 capsule (500 mg total) by mouth four (4) times daily for 10 days.      ibuprofen 600 mg tablet  Commonly known as: Advil, Motrin   600 mg   Take 1 tablet (600 mg total) by mouth every six (6) hours as needed for Mild Pain (Pain Scale 1-3) or Moderate Pain (Pain Scale 4-6).      oxyCODONE 5 mg/5 mL solution   5 mg   Take 5 mLs (5 mg total) by mouth every four (4) hours as needed for Pain. Max Daily Amount: 30 mg             CONTINUE taking these medications        Dose Instructions Notes   ARIPiprazole 5 mg tablet  Commonly known as: Abilify   2.5 mg   Take 0.5 tablets (2.5 mg total) by mouth every evening.      atorvastatin 40 mg tablet  Commonly known as: Lipitor   40 mg   Take 1 tablet (40 mg total) by mouth daily.      buPROPion (XL) 300 mg 24 hr tablet  Commonly known as: Wellbutrin XL   450 mg   Take 450 mg by mouth daily.      gabapentin 300 mg capsule  Commonly known as: Neurontin   300 mg   Take 1 capsule (300 mg total) by mouth daily.      venlafaxine 150 mg 24 hr capsule  Commonly known as: Effexor XR   75 mg   Take 75 mg by mouth daily with breakfast.             STOP taking these medications        STOP taking these medications   UBRELVY 50 MG Tabs  Generic drug: Ubrogepant                Prescriptions        These medications were sent to [facility] [phone])  [address], [place] [address]      Hours: Mon-Fri 8AM-9PM, Sat 8AM-7PM, Sun/Holidays 8AM-5PM (Closed 1PM-2PM for lunch) Phone: [phone]   acetaminophen 500 mg Tabs tablet  cephalexin 500 mg capsule  ibuprofen 600 mg tablet  oxyCODONE 5 mg/5 mL solution         Carlene Coria, PharmD, 06/02/2022, 12:54 PM

## 2022-06-02 NOTE — Op Note
----------------------------------------------------------------------------------------------------------------------  (THE FOLLOWING IS FOR NURSING REFERENCE AND HAS BEEN PULLED FROM THE CURRENT CHART. PACU Phone: [phone])    Procedure(s) (LRB):  ARTHROPLASTY TEMPOROMANDIBULAR JOINT W/ PROSTHETIC JOINT REPLACEMENT CONDYLECTOMY CORONOIDECTOMY MMF (RIGHT)(RIGHT) (Right)  Disorder of right temporomandibular joint [M26.601]  Temporomandibular joint disorder [M26.609]  Treatment Team       Provider   Role Specialty Contact     Surgery, Oral And Maxillofacial  1st Provider Contact --   [phone]       Coralyn Mark., DDS  Attending Surgery, Oral & Maxillofacial   [id number]    [phone]       Surgery, Dentistry - Oral/Orthodontic  Team Dentistry --        __________________________________________________________________________________    History  Past Medical History:   Diagnosis Date   ? Anxiety    ? Depression    ? Hyperlipidemia    ? Sleep apnea     CPAP q HS     Past Surgical History:   Procedure Laterality Date   ? ROTATOR CUFF REPAIR Left      __________________________________________________________________________________    Labs  No results for input(s): ''WBC'', ''HCT'', ''HGB'', ''PLT'', ''PT'', ''APTT'', ''INR'', ''NA'', ''K'', ''CL'', ''CO2'', ''BUN'', ''CREAT'', ''GLUCOSE'', ''MG'', ''PHOS'', ''ICALCOR'', ''GLUCOSEPOC'' in the last 72 hours.  __________________________________________________________________________________    Vital Signs  Last Recorded Vital Signs:    06/01/22 1800   BP: 139/72   Pulse: 75   Resp: 11   Temp:    SpO2: 93%     @LASTETCO2 @  Temp Readings from Last 1 Encounters:   06/01/22 36.9 ?C (98.4 ?F) (Temporal)       Pain Information (Last Filed)     Score Location Comments Edu?      5 None None None        __________________________________________________________________________________    Intake and Output  No intake/output data recorded.  I/O this shift:  In: 1300 [I.V.:1300]  Out: 650 [Urine:500; Blood:150] __________________________________________________________________________________    IV Drips/Fluids/PCA:   ? 0.9% NaCl/KCl 20 mEq 125 mL/hr (06/01/22 1709)     __________________________________________________________________________________    Lines, Drains, and Airways     Peripheral IV 20 G Right Hand (Active)     __________________________________________________________________________________    ----------------------------------------------------------------------------------------------------------------------      PACU NursingTransfer Note  6:09 PM, 06/01/2022      Isolation? No    Antibiotics in OR:  Last Antibiotic (last 24 hours) Showing orders from other encounters    Date/Time Action Medication Dose    06/01/22 1541 Given    ceFAZolin inj 2 g    06/01/22 1141 Given    ceFAZolin inj 2 g        Last Antiemetic:   Med Administrations and Associated Flowsheet Values (last 4 hours) Showing orders from other encounters    Date/Time Action Medication Dose    06/01/22 1540 Given    ondansetron 4 mg/2 mL inj 4 mg        Acetaminophen given @:  Med Administrations and Associated Flowsheet Values (last 24 hours)     Date/Time Action Medication Dose    06/01/22 1533 Given    acetaminophen IV inj 1,000 mg 1,000 mg        Last pain medication:   Pain Meds (last 4 hours) Showing orders from other encounters    Date/Time Action Medication Dose    06/01/22 1713 Given    HYDROmorphone 1 mg/mL inj 0.2 mg 0.2 mg    06/01/22 1631 Given   [  right tmj area]    bupivacaine-EPINEPHrine PF 0.5%-1:200000 inj 7 mL    06/01/22 1629 Given    propofol 200 mg/20 mL inj 30 mg    06/01/22 1538 Given    ketorolac 30 mg/mL inj 30 mg    06/01/22 1533 Given    acetaminophen IV inj 1,000 mg 1,000 mg    06/01/22 1527 Given    HYDROmorphone 1 mg/mL inj 0.2 mg    06/01/22 1457 Given    HYDROmorphone 1 mg/mL inj 0.2 mg        Txp Anti-rejection medication:  Anti-rejection meds. (last 24 hours) Showing orders from other encounters    Date/Time Action Medication Dose    06/01/22 1141 Given    dexamethasone 4 mg/mL inj 10 mg          Does the patient use prescription pain medication at home (prior to admission)?Marland KitchenMarland KitchenMarland KitchenUnknown    Pain level: Acceptable for patient? Yes    Difficult Airway? No    Respiratory is at baseline? Yes    Has the patient received Flumazenil or Narcan in PACU? No  (if ''Yes'', must be at least 45 minutes prior to transfer)     Aldrete: 10    Level of Consciousness:  Awake, Alert, Oriented x four    Cardiac Rhythm?  Normal Sinus    Surgical Site: Intact and Dry or with Minimal Drainage  Lines, Drains, and Airways     Wound  Duration             Incision 06/01/22 Right Face <1 day               Diet: blenderized diet    Tolerating liquids: Undetermined    Activity: MAE: Has not ambulated    Voided:  Due to Void    Significant Other Location:  Unknown    Will Transport to: Floor                       With: HA, Escort or Care Partner    Continuity of Care Issues from OR or PACU:   None

## 2022-06-02 NOTE — Consults
FINAL DISCHARGE MULTIDISCIPLINARY NOTE  Department of Care Coordination      Admit XNAT:[id number]  Anticipated Date of Discharge: 06/02/2022    Following GU:[id number], Hyman Hopes., DDS    Home Address:[address]  [place] [address]      DISCHARGE INFORMATION:     Discharge Address: [facility] [address] [facility] [address] [facility] [place] [address]    Individual(s) notified of discharge plan:  Contact Name: Anaka Beazer Relationship: Self   Contact Number(s): [phone]      Is patient/family informed of discharge?: Yes Is patient/family agreeable of discharge destination?: Yes     Support Systems: Spouse       Medicare Important Message Provided: Not Applicable      Final Discharge Needs: No Needs         Homeless Discharge (if applicable):   Primary Living Situation: Lives w/Capable Spouse    Transportation Arrangements (if applicable):      Pt does not require assistance with transportation upon dc.         Windy Canny, BSN, RN, CCM,  06/02/2022

## 2022-06-02 NOTE — Progress Notes
Patient received from PACU @0706 , patient slightly lethargic but arousable and oriented. On 3L NC, suction/yankauer, scissors and 1ml siringe with red robin at bedside. VSS.   Will endorse care to oncoming shift.

## 2022-06-02 NOTE — Other
Patient's Clinical Goal:   Clinical Goal(s) for the Shift: Pain control  Identify possible barriers to advancing the care plan: None  Stability of the patient: Moderately Stable - low risk of patient condition declining or worsening   Progression of Patient's Clinical Goal: Pain moderately controlled, Tolerating blenderized diet, VSS, Zofran for nausea with relief, Dressing CDI, Yaunker and scissors at bedside for safety. Will,conintue POC.

## 2022-06-02 NOTE — Progress Notes
Pharmaceutical Services - Admission Medication Reconciliation Note      Patient Name: Sarah Martin  Medical Record Number: [id number]  Admit date: 06/01/2022 4:51 PM    Age: 51 y.o.  Sex: female  Allergies: No Known Allergies  Height:   Most recent documented height   06/01/22 1.499 m (4' 11'')     Actual Weight:   Most recent documented weight   06/01/22 96.2 kg     Diagnosis: The patient is currently admitted with the following concerns/issues: Principal Problem:    Temporomandibular joint disorder (POA: Yes)      Reported Medication History   I spoke with the patient at bedside, used Surescripts (outpatient Rx fill history database), and wasn't able to update the home medication list for this hospital admission.    I was able to speak to the patient to obtain her outpatient medication regimen.    However, I wasn't able to update the home medication list on file because discharge med rec has been completed by the primary team and discharge orders have been placed already.      Please see my notes in BLUE below in regards to each medication - Last doses below were entered by other RN staff earlier during this admission.     PTA Medication List (discrepancies are noted)   Medications Prior to Admission   Medication Sig Notes Last Dose    ARIPiprazole 5 mg tablet Take 0.5 tablets (2.5 mg total) by mouth every evening. Patient confirmed using 0.5 tablet = 2.5 mg qpm  05/31/2022 at 2000    atorvastatin 40 mg tablet Take 1 tablet (40 mg total) by mouth daily. Patient confirmed using as noted (40 mg qday) 05/31/2022 at 2000    buPROPion, XL, 300 mg 24 hr tablet Take 450 mg by mouth daily. Patient reported using 1 tablet of 150 mg XL + 1 tablet of 300 mg XL to equal 450 mg qam  06/01/2022 at 0630    gabapentin 300 mg capsule Take 1 capsule (300 mg total) by mouth daily. Patient reported using 300 mg qpm specifically - stated that dose was recently increased from 200 mg qpm to 300 mg qpm 05/31/2022 at 2000    venlafaxine 150 mg 24 hr capsule Take 75 mg by mouth daily with breakfast. Patient reported using 1 capsule of 75 mg ER qam (not the 150 mg strength capsules listed)  06/01/2022 at 0630    [DISCONTINUED] Ubrogepant (UBRELVY) 50 MG TABS Take 1 tablet (50 mg total) by mouth as needed for (migraine, may repeat x 1). (Patient not taking: Reported on 05/25/2022.) This outpatient prescription was discontinued by the inpatient provider  Patient reported no longer using because she started using a CPAP and her headaches went away Not Taking       Discharge Prescription Preference:   [facility] [phone] [facility] [place] - [address]  [address]  [place] [address]        The patient's allergies and medications have been reviewed but unable to be updated because discharge med rec has been completed by the primary team and discharge orders have been placed already.      There are no issues requiring follow up at this time.        Teresita Madura, PharmD, 06/02/2022, 8:49 AM  Transitions of Care Pharmacist  [facility]   [facility]  [address]  [place], [place] [address]  Phone: [phone]  Pager x [phone]    Please note, accuracy is based on the patient?s/family's/caregiver's ability and willingness to provide this information at the time of interview. This progress note represents our good faith effort to obtain the best possible medication history from all attainable sources at the time of reconciliation.

## 2022-06-02 NOTE — Nursing Note
Vitals:    06/02/22 0900 06/02/22 0901 06/02/22 1145 06/02/22 1152   BP:    131/77   Pulse:    77   Resp:    13   Temp:    36.7 C (98.1 F)   TempSrc:    Temporal   SpO2: (!) 86% (!) 90% 96% 95%   Weight:       Height:         AVS reviewed, pts satts improved >93% when sitting upright in bed. Husband at bedside. Sent to the [facility].

## 2022-06-02 NOTE — Consults
CASE MANAGER ASSESSMENT      Admit ZOXW:[id number]    Date of Initial CM Assessment: 06/02/2022    Problems: Active Problems:    * No active hospital problems. *       Past Medical History:   Diagnosis Date   ? Anxiety    ? Depression    ? Hyperlipidemia    ? Sleep apnea     CPAP q HS    Past Surgical History:   Procedure Laterality Date   ? ROTATOR CUFF REPAIR Left           Primary Care Physician:Jones, Janace Hoard, MD  Phone:[phone]      NEEDS ASSESSMENT:     Level of Function Prior to Admit: Self Care/Indep. W ADLs    Primary Living Situation: Lives w/Capable Spouse    Hours of Assistance/Day: NA    Pre-admission Living Situation: Home/Apartment, 1-Story       Primary Support Systems: Spouse/significant other    Contact Name: Analie Katzman Phone Number: [phone]   Does the patient have a Family/Support System member participating in Discharge Planning?: Yes    DPOA?: No       Bathroom on Main Floor: Yes  Stairs in Home: 0       Prior Treatments / Services: None      Who is your PCP?: Dalbert Mayotte, MD    Do you have your Primary Care Doctor's office number?: Yes    How often do you visit your doctor?: Annual    Do you need information/education regarding your medical condition?: No               Were you hospitalized in the last 30 days?: No      DISCHARGE ASSESSMENT:     Projected Date of Discharge: 06/02/2022    Anticipated Complex D/C?: No    Projected Discharge to: Home    Discharge Address: [address]  [facility] [place] [address]    Projected Discharge Needs: None      Support Identified at Discharge: Spouse  Name of Discharge Support Person: Same as above Phone Number: Same as above     Who is available to transport you upon discharge?: Family Transportation         SDOH     Within the past 12 months, has a lack of transportation kept you from medical appointments, meetings, work, or from getting things needed for daily living? : No  How hard is it for you to pay for the very basics like food, housing, medical care, and heating?: Not hard at all  In the past 12 months has the electric, gas, oil, or water company threatened to shut off services in your home?: No  In the last 12 months, was there a time when you were not able to pay the mortgage or rent on time?: No  In the last 12 months, how many places have you lived?: 1  In the last 12 months, was there a time when you did not have a steady place to sleep or slept in a shelter (including now)?: No            Waldon Reining, BSN, RN, CCM,  06/02/2022

## 2022-06-02 NOTE — Discharge Instructions
Post-operative instructions until Cleared by Surgeon at Clinic Follow-up Appointments:     Diet:      BLENDERIZED to  PUREE DIET such as apple sauce, jello, blenderized food, cream soups, etc.  - No crunchy or chewy foods  - Increase protein intake for wound healing  - Keep hydrated - drink at least 6-8 glasses of water per day   - Eat frequent meals   - Diet supplements such as ensure may be used to increase caloric intake     Activity:   - Avoid strenuous activity - walking OK   - Practice moving the jaw by opening and closing your mouth.   - Avoid activities that could injure surgical area(s)  - No activities that could strain face such as bending forward  - Keep surgical area elevated to assist in decreasing post-operative swelling   - Sleep with head elevated on pillows to help decrease swelling   - No direct pressure or sleeping on face    Medications:    - Take your medications as prescribed   - Pain control:  You may take Acetaminophen and ibuprofen as needed for pain, do not exceed recommended dosage. You can expect to wean off your pain medications over the next few days to weeks depending on your pain tolerance and procedure performed.    - Make sure to take stool softeners such as Colace or laxatives such as Miralax daily   to prevent constipation while on narcotic pain medications.  - You have been provided with a narcotic prescription to last until your initial post operative appointment.  Additional narcotic prescriptions will be provided to you by your surgeon at your clinic visits.  Please contact your surgeons office directly     Wound Care:   - Your jaw is secured with guiding elastics.  Please have scissors near you at all times should you need to cut the elastics for an EMERGENCY THAT MAKES IT DIFFICULT TO BREATHE SUCH AS VOMITING, EXTREME COUGHING OR CHOKING.  - Apply iced water dipped wash clothes to face to decrease swelling for 20 minutes 4 times daily for the first 5-7 days after surgery. Do not apply ice or cold packs directly to face to prevent frostbite   - No direct pressure or sleeping on face  - You may shower however no water submersion (no pool, no Jacuzzi, no bath, and no sauna)    - No direct pressure or sleeping on face  - Swelling and any bruising will decrease over the next few weeks to months    Follow-up    A follow up appointment has been made for you for Tuesday, June 07, 2022 at 11:30AM in the [facility] Oral and Maxillofacial Surgery clinic (address details below).    When to call your Doctor:    For NON-urgent medical issues, questions, prescription refills, or to schedule or change an appointment, please message your provider through [email or web address]   [email or web address] allows you to:  o   Contact members of your care team, including your surgeon  o   Ask non-urgent medical questions  o   Send your MD photo(s)  o   Request referrals  o   View lab and test results  o   Request prescription refills  o   Request, change, or cancel appointments    For URGENT medical issues, including:  o   Fever>101o F  o   Shortness of breath  o   Chest pain  o  Calf/leg pain  o   Redness, warmth, swelling or unusual drainage from wound(s) or tube(s)  o   Fluid leaking around the tube(s)  o   Bright red or excessive drainage from the incision(s) or in the drain(s)    During business hours (from 8:00 AM to 5:00 PM), please call your surgeon?s office directly or call [phone].  Afterhours (from 5:00 PM to 8:00 AM) please page the Oral and Maxillofacial Surgery resident on call at [phone]  or go to the emergency room. (This should not be used for non-urgent requests such as scheduling appointments, requesting non-urgent medications refills, etc.)    [facility]  Clinic Address: [facility], [address], [place], [place] [address]  (Located on the [place], [address] [facility])  Parking: [address] [facility] parking structure)  Clinic Hours: 8:00 AM to 5:00 PM - Monday through Friday. (Closed 12:00 PM to 1:00 PM for lunch.)  Front Desk: Phone: [phone]

## 2022-06-03 NOTE — Discharge Summary
DISCHARGE SUMMARY/ ORAL AND MAXILLOFACIAL SURGERY  PATIENT:  Sarah Martin MRN:  [id number]  DOB:  [birth year]    Attending Physician: No att. providers found  Primary Care Physician:  Dalbert Mayotte, MD    Admission Date:  06/01/2022  Discharge Date:  06/02/2022  1:08 PM     Admission Diagnosis:    *Disorder of right temporomandibular joint [M26.601]  Temporomandibular joint disorder [M26.609]  Discharge Diagnosis:    Disorder of right temporomandibular joint [M26.601]  Temporomandibular joint disorder [M26.609]    Surgery Performed:   Right temporomandibular joint condylectomy  Right temporomandibular joint alloplastic joint reconstruction  Maxillomandibular fixation    Complications:   None    Consultations:   None    Chronic Conditions:  Past Medical History:   Diagnosis Date   ? Anxiety    ? Depression    ? Hyperlipidemia    ? Sleep apnea     CPAP q HS       HPI: Sarah Martin is a 51 y.o. female with a past medical history of depression/anxiety, hyperlipidemia. OSA,  tendonitis who initially presented to [facility] with long history of orthodontic treatment and locked jaw since age 47. Twenty years ago, pt saw physical therapist which helped and was doing well functionally until early 2022. At that time, she noted locking, clicking, and pain. Patient previously has seen an orofacial pain specialist and OMFS for conservative management of TMD and had a previous orthotic device but patient still reports pain on right TMJ. CT scan was completed which shows significant degenerative joint disease of right TMJ. Due to radiographic imaging and clinical findings, surgical options were proposed to which the patient agreed to proceed with surgical treatment.      Brief Hospital Course:     On 06/01/2022,  the patient was taken to the operating room at [facility] and underwent above mentioned surgery. Please refer to operation report for details. The patient tolerated the operation well, without complications, and was admitted for standard postoperative management and monitoring. The patient was transferred to the PACU in stable condition.  She was started on a blenderized diet, and appropriate home medications were restarted.  On post-op day 1, the patient was able to void without difficulty, tolerated the blenderized diet.  The patient was breathing easily on room air, and had adequate pain control with oral medication. The patient was deemed stable for discharge.    Physical Exam:  BP 131/77  ~ Pulse 77  ~ Temp 98.1 ?F (36.7 ?C) (Temporal)  ~ Resp 13  ~ Ht 4' 11'' (1.499 m)  ~ Wt 212 lb 1.3 oz (96.2 kg)  ~ SpO2 95%  ~ BMI 42.84 kg/m?   GENERAL: Cooperative, no weight change, no change in appetite, no fever or chills  HEAD: NC, NAD  EYES: PERRLA, EOMI grossly, Conjunctiva & Sclera clear  EARS: grossly intact hearing  NOSE: Nares patent  FACIES: Symmetric  MOUTH: dentition grossly intact, occlusion stable, soft tissues WNL, elastics in place  THROAT: clear, MMM  NECK: No Masses , LAD , Thyromegaly noted, Steri strips intact with minimal strikethrough  NEURO: AAOx3, R CN VII temporal and zygomatic branch paresis, otherwise CN exam WNL  Ext: moving all ext    Discharge Labs:         Lab Results   Component Value Date    ALT 25 02/09/2022    AST 18 02/09/2022    ALKPHOS 141 02/09/2022    BILITOT 0.4 02/09/2022  Imaging/Studies:  CT brain image import comparison; Date of Exam: 12/20/2021    Result Date: 05/25/2022  This order is used to store images obtained from non Bond imaging facilities in PACS. This order is intended for comparison purposes only and no formal result will be rendered.      Discharge Medications:   Discharge Medication List as of 06/02/2022 12:29 PM      START taking these medications    Details   acetaminophen 500 mg tablet Take 1 tablet (500 mg total) by mouth every six (6) hours as needed for Pain., Starting Thu 06/02/2022, Until Thu 06/16/2022 at 2359, Normal      cephalexin 500 mg capsule Take 1 capsule (500 mg total) by mouth four (4) times daily for 10 days., Starting Thu 06/02/2022, Until Sun 06/12/2022, Normal      ibuprofen 600 mg tablet Take 1 tablet (600 mg total) by mouth every six (6) hours as needed for Mild Pain (Pain Scale 1-3) or Moderate Pain (Pain Scale 4-6)., Starting Thu 06/02/2022, Until Thu 06/16/2022 at 2359, Normal      oxyCODONE 5 mg/5 mL solution Take 5 mLs (5 mg total) by mouth every four (4) hours as needed for Pain. Max Daily Amount: 30 mg, Starting Thu 06/02/2022, Normal         CONTINUE these medications which have NOT CHANGED    Details   ARIPiprazole 5 mg tablet Take 0.5 tablets (2.5 mg total) by mouth every evening., Historical Med      atorvastatin 40 mg tablet Take 1 tablet (40 mg total) by mouth daily., Historical Med      buPROPion, XL, 300 mg 24 hr tablet Take 450 mg by mouth daily., Historical Med      gabapentin 300 mg capsule Take 1 capsule (300 mg total) by mouth daily., Historical Med      venlafaxine 150 mg 24 hr capsule Take 75 mg by mouth daily with breakfast., Historical Med         STOP taking these medications       Ubrogepant (UBRELVY) 50 MG TABS Comments:   Reason for Stopping:                Disposition: Home.      Discharge Condition: Stable    Diet: Blenderized    Activity/ Restrictions: Ambulate as tolerated.  No heavy lifting over 5-10 pounds for 4-6 weeks, and may increase activity as tolerated.  May shower once dressings are removed but do not submerge your incisions in a bath or pool.    Return Precautions: As listed on After Visit Summary (AVS), but also including:      - Onset of severe, persistent pain not relieved by medication and rest.      - Difficulty obtaining medications      - Any new onset of or increased weakness, numbness or tingling      - Persistent chills; new onset of fever > 101 degrees F, or night sweats      - Any redness, swelling, drainage, heat, or pain around your incision      - Any new onset of chest pain or shortness of breath      - If applicable, any clogging or dislodgement of your surgical drain.    Post-Discharge Referrals: None    Post-Discharge Appointments:   The patient was asked to follow up with surgeon for a postoperative visit at [facility] on Tuesday, Jun 07, 2022  at 11:30AM.    Additional appointments should be made with:   Follow up with your primary care doctor in 2-3 weeks.    The patient was seen and examined by the Oral and Maxillofacial Surgery team.  Above plan was discussed with chief resident and attending physician, Dr. Vergia Alcon, who were in agreement with the plan.    Author: Tollie Pizza. Cherly Hensen, DDS  06/03/2022 12:27 PM   Oral and Maxillofacial Surgery

## 2022-06-07 ENCOUNTER — Encounter: Payer: Self-pay | Admitting: Gastroenterology

## 2022-06-08 ENCOUNTER — Ambulatory Visit: Payer: BC Managed Care – PPO | Admitting: Certified Registered"

## 2022-06-08 ENCOUNTER — Ambulatory Visit
Admission: RE | Admit: 2022-06-08 | Discharge: 2022-06-08 | Disposition: A | Discharge status: Home / Self Care | Payer: BC Managed Care – PPO | Source: Ambulatory Visit | Attending: Gastroenterology | Admitting: Gastroenterology

## 2022-06-08 ENCOUNTER — Encounter: Payer: Self-pay | Admitting: Gastroenterology

## 2022-06-08 ENCOUNTER — Encounter
Admission: RE | Disposition: A | Discharge status: Home / Self Care | Payer: Self-pay | Source: Ambulatory Visit | Attending: Gastroenterology

## 2022-06-08 DIAGNOSIS — E271 Primary adrenocortical insufficiency: Secondary | ICD-10-CM | POA: Diagnosis not present

## 2022-06-08 DIAGNOSIS — F419 Anxiety disorder, unspecified: Secondary | ICD-10-CM | POA: Insufficient documentation

## 2022-06-08 DIAGNOSIS — K219 Gastro-esophageal reflux disease without esophagitis: Secondary | ICD-10-CM | POA: Insufficient documentation

## 2022-06-08 DIAGNOSIS — Z1211 Encounter for screening for malignant neoplasm of colon: Secondary | ICD-10-CM | POA: Insufficient documentation

## 2022-06-08 DIAGNOSIS — I10 Essential (primary) hypertension: Secondary | ICD-10-CM | POA: Insufficient documentation

## 2022-06-08 DIAGNOSIS — K5730 Diverticulosis of large intestine without perforation or abscess without bleeding: Secondary | ICD-10-CM | POA: Insufficient documentation

## 2022-06-08 DIAGNOSIS — K640 First degree hemorrhoids: Secondary | ICD-10-CM | POA: Insufficient documentation

## 2022-06-08 DIAGNOSIS — K635 Polyp of colon: Secondary | ICD-10-CM | POA: Insufficient documentation

## 2022-06-08 DIAGNOSIS — K648 Other hemorrhoids: Secondary | ICD-10-CM | POA: Diagnosis not present

## 2022-06-08 DIAGNOSIS — K5790 Diverticulosis of intestine, part unspecified, without perforation or abscess without bleeding: Secondary | ICD-10-CM | POA: Diagnosis not present

## 2022-06-08 DIAGNOSIS — R0602 Shortness of breath: Secondary | ICD-10-CM | POA: Diagnosis not present

## 2022-06-08 DIAGNOSIS — D126 Benign neoplasm of colon, unspecified: Secondary | ICD-10-CM

## 2022-06-08 DIAGNOSIS — M1990 Unspecified osteoarthritis, unspecified site: Secondary | ICD-10-CM | POA: Diagnosis not present

## 2022-06-08 DIAGNOSIS — D649 Anemia, unspecified: Secondary | ICD-10-CM | POA: Insufficient documentation

## 2022-06-08 HISTORY — PX: COLONOSCOPY WITH PROPOFOL: SHX5780

## 2022-06-08 HISTORY — DX: Personal history of urinary calculi: Z87.442

## 2022-06-08 LAB — Tissue Exam

## 2022-06-08 SURGERY — COLONOSCOPY WITH PROPOFOL
Anesthesia: General

## 2022-06-08 MED ORDER — SODIUM CHLORIDE 0.9 % IV SOLN
INTRAVENOUS | Status: DC
  Administered 2022-06-08: 1000 mL via INTRAVENOUS

## 2022-06-08 MED ORDER — LIDOCAINE HCL (CARDIAC) PF 100 MG/5ML IV SOSY
PREFILLED_SYRINGE | INTRAVENOUS | Status: DC | PRN
  Administered 2022-06-08: 50 mg via INTRAVENOUS

## 2022-06-08 MED ORDER — PROPOFOL 500 MG/50ML IV EMUL
INTRAVENOUS | Status: DC | PRN
  Administered 2022-06-08: 140 ug/kg/min via INTRAVENOUS

## 2022-06-08 MED ORDER — DEXMEDETOMIDINE HCL IN NACL 80 MCG/20ML IV SOLN
INTRAVENOUS | Status: DC | PRN
  Administered 2022-06-08: 8 ug via BUCCAL

## 2022-06-08 MED ORDER — PROPOFOL 10 MG/ML IV BOLUS
INTRAVENOUS | Status: DC | PRN
  Administered 2022-06-08: 20 mg via INTRAVENOUS
  Administered 2022-06-08: 60 mg via INTRAVENOUS

## 2022-06-08 NOTE — Op Note (Signed)
[facility] Patient Name: Denise Burns Procedure Date: 06/08/2022 9:57 AM MRN: [id number] Account #: [id number] Date of Birth: [birth year] Admit Type: Outpatient Age: 51 Room: [facility] ENDO ROOM 3 Gender: Female Note Status: Finalized Instrument Name: Jasper Riling [id number] Procedure:             Colonoscopy Indications:           Screening for colorectal malignant neoplasm Providers:             Jonathon Bellows MD, MD Referring MD:          Leretha Pol, NP. Medicines:             Monitored Anesthesia Care Complications:         No immediate complications. Procedure:             Pre-Anesthesia Assessment:                        - Prior to the procedure, a History and Physical was                         performed, and patient medications, allergies and                         sensitivities were reviewed. The patient's tolerance                         of previous anesthesia was reviewed.                        - The risks and benefits of the procedure and the                         sedation options and risks were discussed with the                         patient. All questions were answered and informed                         consent was obtained.                        - ASA Grade Assessment: II - A patient with mild                         systemic disease.                        After obtaining informed consent, the colonoscope was                         passed under direct vision. Throughout the procedure,                         the patient's blood pressure, pulse, and oxygen                         saturations were monitored continuously. The                         Colonoscope was introduced through the  anus and                         advanced to the the cecum, identified by the                         appendiceal orifice. The colonoscopy was performed                         with ease. The patient tolerated the procedure well.                          The quality of the bowel preparation was excellent.                         The appendiceal orifice was photographed. Findings:      The perianal and digital rectal examinations were normal.      A 5 mm polyp was found in the sigmoid colon. The polyp was sessile. The       polyp was removed with a cold snare. Resection and retrieval were       complete.      Multiple small-mouthed diverticula were found in the sigmoid colon.      Non-bleeding internal hemorrhoids were found during retroflexion. The       hemorrhoids were large and Grade I (internal hemorrhoids that do not       prolapse).      The exam was otherwise without abnormality on direct and retroflexion       views. Impression:            - One 5 mm polyp in the sigmoid colon, removed with a                         cold snare. Resected and retrieved.                        - Diverticulosis in the sigmoid colon.                        - Non-bleeding internal hemorrhoids.                        - The examination was otherwise normal on direct and                         retroflexion views. Recommendation:        - Discharge patient to home (with escort).                        - Resume previous diet.                        - Continue present medications.                        - Await pathology results.                        - Repeat colonoscopy for surveillance based on  pathology results. Procedure Code(s):     --- Professional ---                        [phone], Colonoscopy, flexible; with removal of                         tumor(s), polyp(s), or other lesion(s) by snare                         technique Diagnosis Code(s):     --- Professional ---                        Z12.11, Encounter for screening for malignant neoplasm                         of colon                        D12.5, Benign neoplasm of sigmoid colon                        K64.0, First degree hemorrhoids                         K57.30, Diverticulosis of large intestine without                         perforation or abscess without bleeding CPT copyright 2022 American Medical Association. All rights reserved. The codes documented in this report are preliminary and upon coder review may  be revised to meet current compliance requirements. Jonathon Bellows, MD Jonathon Bellows MD, MD 06/08/2022 10:16:47 AM This report has been signed electronically. Number of Addenda: 0 Note Initiated On: 06/08/2022 9:57 AM Scope Withdrawal Time: 0 hours 8 minutes 36 seconds  Total Procedure Duration: 0 hours 11 minutes 4 seconds  Estimated Blood Loss:  Estimated blood loss: none.      [facility]

## 2022-06-08 NOTE — H&P (Signed)
Jonathon Bellows, MD [address] [place], [place], [place], [address] [address], [place], [place], [place], [address] Phone: [phone]  Fax: [phone]  Primary Care Physician:  [name], NP   Pre-Procedure History & Physical: HPI:  Denise Burns is a 51 y.o. female is here for an colonoscopy.   Past Medical History:  Diagnosis Date   Addison disease ([place])    Arthritis    Back pain    Complication of anesthesia    nausea   GERD (gastroesophageal reflux disease)    History of kidney stones     Past Surgical History:  Procedure Laterality Date   CHOLECYSTECTOMY  2010   CYSTOSCOPY W/ RETROGRADES Right 08/09/2021   Procedure: CYSTOSCOPY WITH RETROGRADE PYELOGRAM;  Surgeon: Hollice Espy, MD;  Location: [facility] ORS;  Service: Urology;  Laterality: Right;   CYSTOSCOPY W/ URETERAL STENT REMOVAL Right 08/09/2021   Procedure: CYSTOSCOPY WITH STENT REMOVAL;  Surgeon: Hollice Espy, MD;  Location: [facility] ORS;  Service: Urology;  Laterality: Right;   CYSTOSCOPY/URETEROSCOPY/HOLMIUM LASER/STENT PLACEMENT Right 06/28/2021   Procedure: CYSTOSCOPY/URETEROSCOPY/STENT PLACEMENT;  Surgeon: Hollice Espy, MD;  Location: [facility] ORS;  Service: Urology;  Laterality: Right;   CYSTOSCOPY/URETEROSCOPY/HOLMIUM LASER/STENT PLACEMENT Right 08/09/2021   Procedure: CYSTOSCOPY/URETEROSCOPY/HOLMIUM LASER/STENT PLACEMENT;  Surgeon: Hollice Espy, MD;  Location: [facility] ORS;  Service: Urology;  Laterality: Right;   dental work     DIAGNOSTIC LAPAROSCOPY  2004   endometriosis dx   DILATION AND CURETTAGE OF UTERUS  2010   IR NEPHROSTOMY PLACEMENT LEFT  12/14/2020   NEPHROLITHOTOMY Left 12/14/2020   Procedure: NEPHROLITHOTOMY PERCUTANEOUS;  Surgeon: Hollice Espy, MD;  Location: [facility] ORS;  Service: Urology;  Laterality: Left;   POLYPECTOMY     WISDOM TOOTH EXTRACTION      Prior to Admission medications   Medication Sig Start Date End Date Taking? Authorizing Provider  azelastine  (ASTELIN) 0.1 % nasal spray Place 1 spray into both nostrils 2 (two) times daily. 05/26/21  Yes Boscia, Heather E, NP  escitalopram (LEXAPRO) 5 MG tablet Take 1 tablet (5 mg total) by mouth daily. 05/17/22  Yes Boscia, Heather E, NP  fludrocortisone (FLORINEF) 0.1 MG tablet TAKE 1/2 TABLET BY MOUTH DAILY 08/26/21 08/26/22 Yes Shamleffer, Melanie Crazier, MD  fluticasone (FLONASE SENSIMIST) 27.5 MCG/SPRAY nasal spray Place 1 spray into the nose in the morning and at bedtime.   Yes [provider]  HYDROcodone-acetaminophen (NORCO) 7.5-325 MG tablet Take 1 tablet by mouth every 8 (eight) hours as needed for severe pain. Must last 30 days. 06/02/22 07/02/22 Yes Milinda Pointer, MD  hydrocortisone (CORTEF) 10 MG tablet Take 1 and 1/2 tablets by mouth daily with breakfast and 1/2 tablet daily in the evening 08/26/21  Yes Shamleffer, Melanie Crazier, MD  meloxicam (MOBIC) 15 MG tablet TAKE 1 TABLET BY MOUTH DAILY 03/18/22  Yes Abonza, Maritza, PA-C  tamsulosin (FLOMAX) 0.4 MG CAPS capsule Take 1 capsule (0.4 mg total) by mouth daily. 04/13/22  Yes Hollice Espy, MD  HYDROcodone-acetaminophen (NORCO) 7.5-325 MG tablet Take 1 tablet by mouth every 8 (eight) hours as needed for severe pain. Must last 30 days. 04/03/22 05/03/22  Milinda Pointer, MD  HYDROcodone-acetaminophen (NORCO) 7.5-325 MG tablet Take 1 tablet by mouth every 8 (eight) hours as needed for severe pain. Must last 30 days. 05/03/22 06/02/22  Milinda Pointer, MD  Methen-Hyosc-Meth Blue-Na Phos (UROGESIC-BLUE) 81.6 MG TABS Take 1 tablet (81.6 mg total) by mouth 4 (four) times daily as needed. Patient not taking: Reported  on 06/08/2022 07/15/21   Hollice Espy, MD  pseudoephedrine (SUDAFED) 30 MG tablet Take 30 mg by mouth every 12 (twelve) hours as needed for congestion.     [provider]    Allergies as of 05/18/2022 - Review Complete 05/17/2022  Allergen Reaction Noted   Sulfacetamide sodium (acne)  01/05/2021   Tramadol  Other (See Comments) 04/30/2018   Lisinopril Rash and Cough 04/30/2018   Sulfa antibiotics Other (See Comments) 04/30/2018    Family History  Problem Relation Age of Onset   Arthritis Mother    Hyperlipidemia Mother    Hypertension Mother    High blood pressure Mother    Lung cancer Mother    Liver cancer Mother    Brain cancer Mother    Alcohol abuse Father    Hearing loss Father    Hyperlipidemia Father    High blood pressure Father    Colon polyps Maternal Grandmother 24   Colon cancer Neg Hx    Esophageal cancer Neg Hx    Stomach cancer Neg Hx    Rectal cancer Neg Hx     Social History   Socioeconomic History   Marital status: Married    Spouse name: Legrand Como   Number of children: Not on file   Years of education: Not on file   Highest education level: Not on file  Occupational History   Not on file  Tobacco Use   Smoking status: Former    Types: Cigarettes    Quit date: 08/01/1994    Years since quitting: 27.8   Smokeless tobacco: Never  Vaping Use   Vaping Use: Never used  Substance and Sexual Activity   Alcohol use: Not Currently    Comment: once per 6 months   Drug use: Never   Sexual activity: Yes    Partners: Male    Comment: menopause  Other Topics Concern   Not on file  Social History Narrative   Not on file   Social Determinants of Health   Financial Resource Strain: Not on file  Food Insecurity: Not on file  Transportation Needs: Not on file  Physical Activity: Not on file  Stress: Not on file  Social Connections: Not on file  Intimate Partner Violence: Not on file    Review of Systems: See HPI, otherwise negative ROS  Physical Exam: BP 131/87   Pulse 84   Temp (!) 96.3 F (35.7 C) (Temporal)   Resp 16   Ht '5\' 4"'$  (1.626 m)   Wt 79.5 kg   LMP 12/01/2020 (Approximate)   SpO2 97%   BMI 30.10 kg/m  General:   Alert,  pleasant and cooperative in NAD Head:  Normocephalic and atraumatic. Neck:  Supple; no masses or  thyromegaly. Lungs:  Clear throughout to auscultation, normal respiratory effort.    Heart:  +S1, +S2, Regular rate and rhythm, No edema. Abdomen:  Soft, nontender and nondistended. Normal bowel sounds, without guarding, and without rebound.   Neurologic:  Alert and  oriented x4;  grossly normal neurologically.  Impression/Plan: Krystal Teachey is here for an colonoscopy to be performed for Screening colonoscopy average risk   Risks, benefits, limitations, and alternatives regarding  colonoscopy have been reviewed with the patient.  Questions have been answered.  All parties agreeable.   Jonathon Bellows, MD  06/08/2022, 9:50 AM

## 2022-06-08 NOTE — Transfer of Care (Signed)
Immediate Anesthesia Transfer of Care Note  Patient: Denise Burns  Procedure(s) Performed: COLONOSCOPY WITH PROPOFOL  Patient Location: Endoscopy Unit  Anesthesia Type:General  Level of Consciousness: drowsy  Airway & Oxygen Therapy: Patient Spontanous Breathing  Post-op Assessment: Report given to RN  Post vital signs: stable  Last Vitals:  Vitals Value Taken Time  BP 98/65 06/08/22 1017  Temp    Pulse 80 06/08/22 1018  Resp 22 06/08/22 1018  SpO2 94 % 06/08/22 1018  Vitals shown include unvalidated device data.  Last Pain:  Vitals:   06/08/22 0859  TempSrc: Temporal  PainSc: 0-No pain         Complications: No notable events documented.

## 2022-06-08 NOTE — Anesthesia Preprocedure Evaluation (Signed)
Anesthesia Evaluation  Patient identified by MRN, date of birth, ID band Patient awake    Reviewed: Allergy & Precautions, H&P , NPO status , Patient's Chart, lab work & pertinent test results, reviewed documented beta blocker date and time   History of Anesthesia Complications (+) PONV and history of anesthetic complications  Airway Mallampati: II  TM Distance: >3 FB Neck ROM: full    Dental  (+) Teeth Intact, Dental Advidsory Given   Pulmonary shortness of breath and with exertion, neg sleep apnea, neg COPD, Patient abstained from smoking.Not current smoker, former smoker   Pulmonary exam normal        Cardiovascular Exercise Tolerance: Good METS(-) hypertension(-) CAD and (-) Past MI negative cardio ROS Normal cardiovascular exam(-) dysrhythmias  Rhythm:regular Rate:Normal     Neuro/Psych neg Seizures  Anxiety      Neuromuscular disease  negative psych ROS   GI/Hepatic Neg liver ROS,GERD  Medicated,,  Endo/Other  negative endocrine ROSneg diabetes    Renal/GU Renal disease  negative genitourinary   Musculoskeletal   Abdominal   Peds  Hematology  (+) Blood dyscrasia, anemia   Anesthesia Other Findings Past Medical History: No date: Addison disease ([place]) No date: Arthritis No date: Back pain No date: Complication of anesthesia     Comment:  nausea No date: GERD (gastroesophageal reflux disease) Past Surgical History: 2010: CHOLECYSTECTOMY No date: dental work 2004: DIAGNOSTIC LAPAROSCOPY     Comment:  endometriosis dx 2010: [place] OF UTERUS 12/14/2020: IR NEPHROSTOMY PLACEMENT LEFT 12/14/2020: NEPHROLITHOTOMY; Left     Comment:  Procedure: NEPHROLITHOTOMY PERCUTANEOUS;  Surgeon:               Hollice Espy, MD;  Location: [facility] ORS;  Service:               Urology;  Laterality: Left; No date: POLYPECTOMY No date: WISDOM TOOTH EXTRACTION   Reproductive/Obstetrics negative OB ROS                              Anesthesia Physical Anesthesia Plan  ASA: 3  Anesthesia Plan: General   Post-op Pain Management:    Induction: Intravenous  PONV Risk Score and Plan: 4 or greater and Propofol infusion and TIVA  Airway Management Planned: Natural Airway and Nasal Cannula  Additional Equipment: None  Intra-op Plan:   Post-operative Plan:   Informed Consent: I have reviewed the patients History and Physical, chart, labs and discussed the procedure including the risks, benefits and alternatives for the proposed anesthesia with the patient or authorized representative who has indicated his/her understanding and acceptance.     Dental Advisory Given  Plan Discussed with: CRNA  Anesthesia Plan Comments: (Discussed risks of anesthesia with patient, including possibility of difficulty with spontaneous ventilation under anesthesia necessitating airway intervention, PONV, and rare risks such as cardiac or respiratory or neurological events, and allergic reactions. Discussed the role of CRNA in patient's perioperative care. Patient understands.)        Anesthesia Quick Evaluation

## 2022-06-09 ENCOUNTER — Encounter: Payer: Self-pay | Admitting: Gastroenterology

## 2022-06-09 LAB — SURGICAL PATHOLOGY

## 2022-06-13 ENCOUNTER — Other Ambulatory Visit: Payer: Self-pay | Admitting: Physician Assistant

## 2022-06-13 ENCOUNTER — Other Ambulatory Visit: Payer: Self-pay

## 2022-06-13 DIAGNOSIS — M47816 Spondylosis without myelopathy or radiculopathy, lumbar region: Secondary | ICD-10-CM

## 2022-06-13 MED ORDER — MELOXICAM 15 MG PO TABS
15.0000 mg | ORAL_TABLET | Freq: Every day | ORAL | 0 refills | Status: DC
  Filled 2022-06-13: qty 90, 90d supply, fill #0

## 2022-06-15 ENCOUNTER — Encounter: Payer: Self-pay | Admitting: Gastroenterology

## 2022-06-15 ENCOUNTER — Other Ambulatory Visit: Payer: BC Managed Care – PPO

## 2022-06-15 DIAGNOSIS — R030 Elevated blood-pressure reading, without diagnosis of hypertension: Secondary | ICD-10-CM

## 2022-06-15 DIAGNOSIS — Z0000 Encounter for general adult medical examination without abnormal findings: Secondary | ICD-10-CM | POA: Diagnosis not present

## 2022-06-15 DIAGNOSIS — E559 Vitamin D deficiency, unspecified: Secondary | ICD-10-CM

## 2022-06-15 DIAGNOSIS — R7301 Impaired fasting glucose: Secondary | ICD-10-CM | POA: Diagnosis not present

## 2022-06-15 DIAGNOSIS — R635 Abnormal weight gain: Secondary | ICD-10-CM

## 2022-06-15 DIAGNOSIS — Z6832 Body mass index (BMI) 32.0-32.9, adult: Secondary | ICD-10-CM

## 2022-06-15 DIAGNOSIS — E785 Hyperlipidemia, unspecified: Secondary | ICD-10-CM | POA: Diagnosis not present

## 2022-06-16 LAB — COMPREHENSIVE METABOLIC PANEL
ALT: 22 IU/L (ref 0–32)
AST: 24 IU/L (ref 0–40)
Albumin/Globulin Ratio: 1.7 (ref 1.2–2.2)
Albumin: 4 g/dL (ref 3.8–4.9)
Alkaline Phosphatase: 52 IU/L (ref 44–121)
BUN/Creatinine Ratio: 15 (ref 9–23)
BUN: 12 mg/dL (ref 6–24)
Bilirubin Total: 1 mg/dL (ref 0.0–1.2)
CO2: 25 mmol/L (ref 20–29)
Calcium: 9.3 mg/dL (ref 8.7–10.2)
Chloride: 105 mmol/L (ref 96–106)
Creatinine, Ser: 0.82 mg/dL (ref 0.57–1.00)
Globulin, Total: 2.4 g/dL (ref 1.5–4.5)
Glucose: 96 mg/dL (ref 70–99)
Potassium: 4.1 mmol/L (ref 3.5–5.2)
Sodium: 141 mmol/L (ref 134–144)
Total Protein: 6.4 g/dL (ref 6.0–8.5)
eGFR: 87 mL/min/{1.73_m2} (ref >59)

## 2022-06-16 LAB — LIPID PANEL
Chol/HDL Ratio: 4.3 ratio (ref 0.0–4.4)
Cholesterol, Total: 251 mg/dL — ABNORMAL HIGH (ref 100–199)
HDL: 59 mg/dL (ref >39)
LDL Chol Calc (NIH): 166 mg/dL — ABNORMAL HIGH (ref 0–99)
Triglycerides: 145 mg/dL (ref 0–149)
VLDL Cholesterol Cal: 26 mg/dL (ref 5–40)

## 2022-06-16 LAB — CBC
Hematocrit: 45.3 % (ref 34.0–46.6)
Hemoglobin: 15.2 g/dL (ref 11.1–15.9)
MCH: 31.9 pg (ref 26.6–33.0)
MCHC: 33.6 g/dL (ref 31.5–35.7)
MCV: 95 fL (ref 79–97)
Platelets: 201 10*3/uL (ref 150–450)
RBC: 4.76 x10E6/uL (ref 3.77–5.28)
RDW: 12.6 % (ref 11.7–15.4)
WBC: 8.1 10*3/uL (ref 3.4–10.8)

## 2022-06-16 LAB — TSH+FREE T4
Free T4: 0.75 ng/dL — ABNORMAL LOW (ref 0.82–1.77)
TSH: 2.25 u[IU]/mL (ref 0.450–4.500)

## 2022-06-16 LAB — HEMOGLOBIN A1C
Est. average glucose Bld gHb Est-mCnc: 123 mg/dL
Hgb A1c MFr Bld: 5.9 % — ABNORMAL HIGH (ref 4.8–5.6)

## 2022-06-16 LAB — VITAMIN D 25 HYDROXY (VIT D DEFICIENCY, FRACTURES): Vit D, 25-Hydroxy: 14 ng/mL — ABNORMAL LOW (ref 30.0–100.0)

## 2022-06-20 NOTE — Anesthesia Postprocedure Evaluation (Signed)
Anesthesia Post Note  Patient: Denise Burns  Procedure(s) Performed: COLONOSCOPY WITH PROPOFOL  Patient location during evaluation: Endoscopy Anesthesia Type: General Level of consciousness: awake and alert Pain management: pain level controlled Vital Signs Assessment: post-procedure vital signs reviewed and stable Respiratory status: spontaneous breathing, nonlabored ventilation, respiratory function stable and patient connected to nasal cannula oxygen Cardiovascular status: blood pressure returned to baseline and stable Postop Assessment: no apparent nausea or vomiting Anesthetic complications: no   No notable events documented.   Last Vitals:  Vitals:   06/08/22 1017 06/08/22 1027  BP:  108/70  Pulse:    Resp:    Temp: (!) 35.7 C   SpO2:      Last Pain:  Vitals:   06/08/22 1037  TempSrc:   PainSc: 0-No pain                 Martha Clan

## 2022-06-22 ENCOUNTER — Ambulatory Visit (INDEPENDENT_AMBULATORY_CARE_PROVIDER_SITE_OTHER): Payer: BC Managed Care – PPO | Admitting: Nurse Practitioner

## 2022-06-22 ENCOUNTER — Encounter: Payer: Self-pay | Admitting: Nurse Practitioner

## 2022-06-22 VITALS — BP 126/85 | HR 90 | Resp 18 | Ht 64.0 in | Wt 182.0 lb

## 2022-06-22 DIAGNOSIS — M461 Sacroiliitis, not elsewhere classified: Secondary | ICD-10-CM

## 2022-06-22 DIAGNOSIS — Z0001 Encounter for general adult medical examination with abnormal findings: Secondary | ICD-10-CM

## 2022-06-22 DIAGNOSIS — E785 Hyperlipidemia, unspecified: Secondary | ICD-10-CM | POA: Diagnosis not present

## 2022-06-22 DIAGNOSIS — N200 Calculus of kidney: Secondary | ICD-10-CM

## 2022-06-22 DIAGNOSIS — R7301 Impaired fasting glucose: Secondary | ICD-10-CM | POA: Diagnosis not present

## 2022-06-22 DIAGNOSIS — F1120 Opioid dependence, uncomplicated: Secondary | ICD-10-CM

## 2022-06-22 DIAGNOSIS — E559 Vitamin D deficiency, unspecified: Secondary | ICD-10-CM

## 2022-06-22 MED ORDER — ERGOCALCIFEROL 1.25 MG (50000 UT) PO CAPS: 50000.0000 [IU] | ORAL_CAPSULE | ORAL | 5 refills | Status: DC

## 2022-06-22 NOTE — Progress Notes (Signed)
Complete physical exam   Patient: Denise Burns   DOB: [birth year]   51 y.o. Female  MRN: [id number] Visit Date: 06/22/2022    Chief Complaint  Patient presents with   Annual Exam   Subjective    Denise Burns is a 51 y.o. female who presents today for a complete physical exam.  She reports consuming a  generally healthy  diet. Exercise is limited by orthopedic condition(s): spinal stenosis, osteoarthritis. She generally feels fairly well. She does have additional problems to discuss today.   HPI  Annual physical  Had routine, fasting labs done prior to this visit  --TSH normal with mildly low Free T4.  --LDL and total cholesterol moderately elevated with normal CHOL/LDL ratio.  --vitamin d deficiency  --HgbA1c 5.9 with normal blood sugar.  -denies chest pain, chest pressure, or shortness of breath. She denies headaches or visual disturbances. She denies abdominal pain, nausea, vomiting, or changes in bowel or bladder habits.    Past Medical History:  Diagnosis Date   Addison disease ([place])    Arthritis    Back pain    Complication of anesthesia    nausea   GERD (gastroesophageal reflux disease)    History of kidney stones    Past Surgical History:  Procedure Laterality Date   CHOLECYSTECTOMY  2010   COLONOSCOPY WITH PROPOFOL N/A 06/08/2022   Procedure: COLONOSCOPY WITH PROPOFOL;  Surgeon: Jonathon Bellows, MD;  Location: [facility] ENDOSCOPY;  Service: Gastroenterology;  Laterality: N/A;   CYSTOSCOPY W/ RETROGRADES Right 08/09/2021   Procedure: CYSTOSCOPY WITH RETROGRADE PYELOGRAM;  Surgeon: Hollice Espy, MD;  Location: [facility] ORS;  Service: Urology;  Laterality: Right;   CYSTOSCOPY W/ URETERAL STENT REMOVAL Right 08/09/2021   Procedure: CYSTOSCOPY WITH STENT REMOVAL;  Surgeon: Hollice Espy, MD;  Location: [facility] ORS;  Service: Urology;  Laterality: Right;   CYSTOSCOPY/URETEROSCOPY/HOLMIUM LASER/STENT PLACEMENT Right 06/28/2021   Procedure: CYSTOSCOPY/URETEROSCOPY/STENT  PLACEMENT;  Surgeon: Hollice Espy, MD;  Location: [facility] ORS;  Service: Urology;  Laterality: Right;   CYSTOSCOPY/URETEROSCOPY/HOLMIUM LASER/STENT PLACEMENT Right 08/09/2021   Procedure: CYSTOSCOPY/URETEROSCOPY/HOLMIUM LASER/STENT PLACEMENT;  Surgeon: Hollice Espy, MD;  Location: [facility] ORS;  Service: Urology;  Laterality: Right;   dental work     DIAGNOSTIC LAPAROSCOPY  2004   endometriosis dx   DILATION AND CURETTAGE OF UTERUS  2010   IR NEPHROSTOMY PLACEMENT LEFT  12/14/2020   NEPHROLITHOTOMY Left 12/14/2020   Procedure: NEPHROLITHOTOMY PERCUTANEOUS;  Surgeon: Hollice Espy, MD;  Location: [facility] ORS;  Service: Urology;  Laterality: Left;   POLYPECTOMY     WISDOM TOOTH EXTRACTION     Social History   Socioeconomic History   Marital status: Married    Spouse name: Legrand Como   Number of children: Not on file   Years of education: Not on file   Highest education level: Not on file  Occupational History   Not on file  Tobacco Use   Smoking status: Former    Types: Cigarettes    Quit date: 08/01/1994    Years since quitting: 27.9   Smokeless tobacco: Never  Vaping Use   Vaping Use: Never used  Substance and Sexual Activity   Alcohol use: Not Currently    Comment: once per 6 months   Drug use: Never   Sexual activity: Yes    Partners: Male    Comment: menopause  Other Topics Concern   Not on file  Social History Narrative   Not on file   Social Determinants of Health   Financial Resource Strain:  Not on file  Food Insecurity: Not on file  Transportation Needs: Not on file  Physical Activity: Not on file  Stress: Not on file  Social Connections: Not on file  Intimate Partner Violence: Not on file   Family Status  Relation Name Status   Mother  Alive   Father  Alive   MGM  Deceased at age 66   Brother  Alive   Neg Hx  (Not Specified)   Family History  Problem Relation Age of Onset   Arthritis Mother    Hyperlipidemia Mother    Hypertension Mother    High blood  pressure Mother    Lung cancer Mother    Liver cancer Mother    Brain cancer Mother    Alcohol abuse Father    Hearing loss Father    Hyperlipidemia Father    High blood pressure Father    Colon polyps Maternal Grandmother 25   Colon cancer Neg Hx    Esophageal cancer Neg Hx    Stomach cancer Neg Hx    Rectal cancer Neg Hx    Allergies  Allergen Reactions   Sulfacetamide Sodium (Acne)     Other reaction(s): sores   Tramadol Other (See Comments)    Other reaction(s): muscle tension   Lisinopril Rash and Cough   Sulfa Antibiotics Other (See Comments)    Blisters in mouth     Patient Care Team: [name], NP as PCP - General (Family Medicine)   Medications: Outpatient Medications Prior to Visit  Medication Sig   azelastine (ASTELIN) 0.1 % nasal spray Place 1 spray into both nostrils 2 (two) times daily.   escitalopram (LEXAPRO) 5 MG tablet Take 1 tablet (5 mg total) by mouth daily.   fludrocortisone (FLORINEF) 0.1 MG tablet TAKE 1/2 TABLET BY MOUTH DAILY   fluticasone (FLONASE SENSIMIST) 27.5 MCG/SPRAY nasal spray Place 1 spray into the nose in the morning and at bedtime.   hydrocortisone (CORTEF) 10 MG tablet Take 1 and 1/2 tablets by mouth daily with breakfast and 1/2 tablet daily in the evening   meloxicam (MOBIC) 15 MG tablet Take 1 tablet (15 mg total) by mouth daily.   Methen-Hyosc-Meth Blue-Na Phos (UROGESIC-BLUE) 81.6 MG TABS Take 1 tablet (81.6 mg total) by mouth 4 (four) times daily as needed.   pseudoephedrine (SUDAFED) 30 MG tablet Take 30 mg by mouth every 12 (twelve) hours as needed for congestion.    tamsulosin (FLOMAX) 0.4 MG CAPS capsule Take 1 capsule (0.4 mg total) by mouth daily.   [DISCONTINUED] HYDROcodone-acetaminophen (NORCO) 7.5-325 MG tablet Take 1 tablet by mouth every 8 (eight) hours as needed for severe pain. Must last 30 days.   [DISCONTINUED] HYDROcodone-acetaminophen (NORCO) 7.5-325 MG tablet Take 1 tablet by mouth every 8 (eight) hours as  needed for severe pain. Must last 30 days.   [DISCONTINUED] HYDROcodone-acetaminophen (NORCO) 7.5-325 MG tablet Take 1 tablet by mouth every 8 (eight) hours as needed for severe pain. Must last 30 days.   No facility-administered medications prior to visit.    Review of Systems  Constitutional:  Positive for fatigue. Negative for activity change, appetite change, chills and fever.  HENT:  Negative for congestion, postnasal drip, rhinorrhea, sinus pressure, sinus pain, sneezing and sore throat.   Eyes: Negative.   Respiratory:  Negative for cough, chest tightness, shortness of breath and wheezing.   Cardiovascular:  Negative for chest pain and palpitations.  Gastrointestinal:  Negative for abdominal pain, constipation, diarrhea, nausea and vomiting.  Endocrine: Negative for cold intolerance, heat intolerance, polydipsia, polyphagia and polyuria.  Genitourinary:  Positive for flank pain. Negative for dyspareunia, dysuria, frequency and urgency.  Musculoskeletal:  Negative for arthralgias, back pain and myalgias.  Skin:  Negative for rash.  Allergic/Immunologic: Negative for environmental allergies.  Neurological:  Negative for dizziness, weakness and headaches.  Hematological:  Negative for adenopathy.  Psychiatric/Behavioral:  The patient is nervous/anxious.     Last CBC Lab Results  Component Value Date   WBC 8.1 06/15/2022   HGB 15.2 06/15/2022   HCT 45.3 06/15/2022   MCV 95 06/15/2022   MCH 31.9 06/15/2022   RDW 12.6 06/15/2022   PLT 201 50/51/8335   Last metabolic panel Lab Results  Component Value Date   GLUCOSE 96 06/15/2022   NA 141 06/15/2022   K 4.1 06/15/2022   CL 105 06/15/2022   CO2 25 06/15/2022   BUN 12 06/15/2022   CREATININE 0.82 06/15/2022   EGFR 87 06/15/2022   CALCIUM 9.3 06/15/2022   PROT 6.4 06/15/2022   ALBUMIN 4.0 06/15/2022   LABGLOB 2.4 06/15/2022   AGRATIO 1.7 06/15/2022   BILITOT 1.0 06/15/2022   ALKPHOS 52 06/15/2022   AST 24 06/15/2022    ALT 22 06/15/2022   ANIONGAP 7 12/15/2020   Last lipids Lab Results  Component Value Date   CHOL 251 (H) 06/15/2022   HDL 59 06/15/2022   LDLCALC 166 (H) 06/15/2022   TRIG 145 06/15/2022   CHOLHDL 4.3 06/15/2022   Last hemoglobin A1c Lab Results  Component Value Date   HGBA1C 5.9 (H) 06/15/2022   Last thyroid functions Lab Results  Component Value Date   TSH 2.250 06/15/2022   Last vitamin D Lab Results  Component Value Date   25OHVITD2 <1.0 08/16/2018   25OHVITD3 67 08/16/2018   VD25OH 14.0 (L) 06/15/2022       Objective     Today's Vitals   06/22/22 1024 06/22/22 1055  BP: 126/85   Pulse: (Abnormal) 106 90  Resp: 18   SpO2: 100%   Weight: 182 lb (82.6 kg)   Height: _0  (1.626 m)    Body mass index is 31.24 kg/m.  BP Readings from Last 3 Encounters:  06/29/22 (Abnormal) 127/94  06/22/22 126/85  06/08/22 108/70    Wt Readings from Last 3 Encounters:  06/29/22 182 lb (82.6 kg)  06/22/22 182 lb (82.6 kg)  06/08/22 175 lb 5.7 oz (79.5 kg)     Physical Exam Vitals and nursing note reviewed.  Constitutional:      Appearance: Normal appearance. She is well-developed.  HENT:     Head: Normocephalic and atraumatic.     Right Ear: Tympanic membrane, ear canal and external ear normal.     Left Ear: Tympanic membrane, ear canal and external ear normal.     Nose: Nose normal.     Mouth/Throat:     Mouth: Mucous membranes are moist.     Pharynx: Oropharynx is clear.  Eyes:     Extraocular Movements: Extraocular movements intact.     Conjunctiva/sclera: Conjunctivae normal.     Pupils: Pupils are equal, round, and reactive to light.  Cardiovascular:     Rate and Rhythm: Normal rate and regular rhythm.     Pulses: Normal pulses.     Heart sounds: Normal heart sounds.  Pulmonary:     Effort: Pulmonary effort is normal.     Breath sounds: Normal breath sounds.  Abdominal:     General: Bowel sounds  are normal. There is no distension.      Palpations: There is no mass.     Tenderness: There is no abdominal tenderness. There is no right CVA tenderness, left CVA tenderness, guarding or rebound.     Hernia: No hernia is present.  Musculoskeletal:        General: Normal range of motion.     Cervical back: Normal range of motion and neck supple.  Lymphadenopathy:     Cervical: No cervical adenopathy.  Skin:    General: Skin is warm and dry.     Capillary Refill: Capillary refill takes less than 2 seconds.  Neurological:     General: No focal deficit present.     Mental Status: She is alert and oriented to person, place, and time.  Psychiatric:        Mood and Affect: Mood normal.        Behavior: Behavior normal.        Thought Content: Thought content normal.        Judgment: Judgment normal.     Last depression screening scores   Row Labels 06/22/2022   10:25 AM 05/17/2022    2:57 PM 04/07/2022   10:43 AM  PHQ 2/9 Scores   [place]. No data exists in this row.     PHQ - 2 Score   0 0 0  PHQ- 9 Score   1 5    Last fall risk screening   Row Labels 06/29/2022    9:23 AM  [place]. No data exists in this row.   Falls in the past year?   0    Assessment & Plan    1. Encounter for general adult medical examination with abnormal findings Annual physical today   2. Dyslipidemia, goal LDL below 100 Recommend patient limit intake of fried and fatty foods. She should increase intake of lean proteins and green leafy vegetables. Adding exercise into daily routine will also be beneficial.    3. Impaired fasting glucose Advised patient to limit intake of carbohydrates and sugar and increase water intake daily.   4. Vitamin D deficiency Treat with Drisdol 50000 iu weekly  - ergocalciferol (DRISDOL) 1.25 MG (50000 UT) capsule; Take 1 capsule (50,000 Units total) by mouth once a week.  Dispense: 4 capsule; Refill: 5  5. Multiple renal calculi Continue regular visits with urology as needed and as  scheduled..   6. Osteoarthritis of sacroiliac joint [facility]) Patient sees pain management for chronic SI joint arthritis.   7. Uncomplicated opioid dependence ([place]) Managed per pain management provider.     Immunization History  Administered Date(s) Administered   Influenza-Unspecified 05/09/2018   Moderna Sars-Covid-2 Vaccination 09/16/2019, 10/16/2019, 08/25/2020    Health Maintenance  Topic Date Due   Hepatitis C Screening  Never done   DTaP/Tdap/Td (1 - Tdap) Never done   Zoster Vaccines- Shingrix (1 of 2) Never done   PAP SMEAR-Modifier  06/18/2021   INFLUENZA VACCINE  03/01/2022   COVID-19 Vaccine (4 - 2023-24 season) 04/01/2022   MAMMOGRAM  04/11/2024   COLONOSCOPY (Pts 45-33yr Insurance coverage will need to be confirmed)  06/08/2032   HIV Screening  Completed   HPV VACCINES  Aged Out    Discussed health benefits of physical activity, and encouraged her to engage in regular exercise appropriate for her age and condition.  Problem List Items Addressed This Visit       Endocrine   Impaired fasting glucose  Musculoskeletal and Integument   Osteoarthritis of sacroiliac joint (Bilateral) (Chronic)     Genitourinary   Multiple renal calculi     Other   Uncomplicated opioid dependence ([facility]) (Chronic)   Dyslipidemia, goal LDL below 100   Vitamin D deficiency   Relevant Medications   ergocalciferol (DRISDOL) 1.25 MG (50000 UT) capsule   Other Visit Diagnoses     Encounter for general adult medical examination with abnormal findings    -  Primary        Return in about 6 months (around 12/21/2022) for mood - TSH, Free T4 in 3 months .        [name], NP  [facility] [phone] (phone) [phone] (fax)  [place]

## 2022-06-25 NOTE — Progress Notes (Signed)
PROVIDER NOTE: Information contained herein reflects review and annotations entered in association with encounter. Interpretation of such information and data should be left to medically-trained personnel. Information provided to patient can be located elsewhere in the medical record under "Patient Instructions". Document created using STT-dictation technology, any transcriptional errors that may result from process are unintentional.    Patient: Denise Burns  Service Category: E/M  Provider: [name], MD  DOB: 08/08/1970  DOS: 06/29/2022  Referring Provider: [name], NP  MRN: [id number]  Specialty: Interventional Pain Management  PCP: Denise Freshwater, NP  Type: Established Patient  Setting: Ambulatory outpatient    Location: Office  Delivery: Face-to-face     HPI  [name], a 51 y.o. year old female, is here today because of her Chronic pain syndrome [G89.4]. [name]'s primary complain today is Hip Pain (both) Last encounter: My last encounter with her was on 04/19/2022. Pertinent problems: [name] has Chronic midline low back pain with bilateral sciatica; Chronic low back pain (1ry area of Pain) (Bilateral) (R>L) w/o sciatica; Chronic sacroiliac joint pain (2ry area of Pain) (Right); Chronic pain syndrome; Abnormal MRI, lumbar spine (03/22/2018); Lumbar facet hypertrophy (Multilevel) (Bilateral); Lumbar facet syndrome (Bilateral); DDD (degenerative disc disease), lumbosacral; Spondylolisthesis (8 mm) at L5-S1 level; Lumbar Grade 1 Retrolisthesis (2 mm) of L2/L3; L5-S1 pars defect w/ spondylolisthesis (Bilateral); Lumbar pars defect (L5-S1) (Bilateral); Lumbar foraminal stenosis (Left: L2-3) (Bilateral: L5-S1); Osteoarthritis of sacroiliac joint (Bilateral); Chronic musculoskeletal pain; Neurogenic pain; Osteoarthritis of facet joint of lumbar spine; Greater trochanteric bursitis (Bilateral); Lumbar spondylosis; Spondylosis without myelopathy or  radiculopathy, lumbosacral region; Chronic hip pain (Right); Osteoarthritis of hip (Right); Lumbar radiculitis (L1/L2) (Right); Chronic groin pain (Right); Chronic hip pain (Bilateral); Enthesopathy hip region (Left); Osteoarthritis of hips (Bilateral); Kissing spine syndrome; Chronic low back pain (Midline) w/o sciatica; and Trigger point with back pain (PSIS area) (Right) on their pertinent problem list. Pain Assessment: Severity of Chronic pain is reported as a 3 /10. Location: Hip Left, Right/pain radiaties down her legs at times. Onset: More than a month ago. Quality: Aching, Shooting, Constant. Timing: Constant. Modifying factor(s): Meds, lay down, hot or cold pack. Vitals:  height is _0  (1.626 m) and weight is 182 lb (82.6 kg). Her temperature is 97.3 F (36.3 C) (abnormal). Her blood pressure is 127/94 (abnormal) and her pulse is 94. Her oxygen saturation is 98%.   Reason for encounter: medication management.  The patient indicates doing well with the current medication regimen. No adverse reactions or side effects reported to the medications.   RTCB: 09/30/2022  Nonopioids transferred 05/11/2020: Baclofen and Mobic  Pharmacotherapy Assessment  Analgesic: Hydrocodone/APAP 7.5/325 1 tablet p.o. 3 times daily (22.5 mg/day of hydrocodone)  MME/day: 22.5 mg/day.   Monitoring: [place] PMP: PDMP reviewed during this encounter.       Pharmacotherapy: No side-effects or adverse reactions reported. Compliance: No problems identified. Effectiveness: Clinically acceptable.  Denise Fischer, RN  06/29/2022  9:24 AM  Sign when Signing Visit Nursing Pain Medication Assessment:  Safety precautions to be maintained throughout the outpatient stay will include: orient to surroundings, keep bed in low position, maintain call bell within reach at all times, provide assistance with transfer out of bed and ambulation.  Medication Inspection Compliance: Pill count conducted under aseptic conditions, in front of  the patient. Neither the pills nor the bottle was removed from the patient's sight at any time. Once count was completed pills were immediately returned to  the patient in their original bottle.  Medication: Hydrocodone/APAP Pill/Patch Count:  7 of 90 pills remain Pill/Patch Appearance: Markings consistent with prescribed medication Bottle Appearance: Standard pharmacy container. Clearly labeled. Filled Date: 38 / 02 / 2023 Last Medication intake:  TodaySafety precautions to be maintained throughout the outpatient stay will include: orient to surroundings, keep bed in low position, maintain call bell within reach at all times, provide assistance with transfer out of bed and ambulation.     No results found for: "CBDTHCR" No results found for: "D8THCCBX" No results found for: "D9THCCBX"  UDS:  Summary  Date Value Ref Range Status  03/28/2022 Note  Final    Comment:    ==================================================================== [facility] (MW) ==================================================================== Test                             Result       Flag       Units  Drug Present and Declared for Prescription Verification   Hydrocodone                    2442         EXPECTED   ng/mg creat   Hydromorphone                  467          EXPECTED   ng/mg creat   Dihydrocodeine                 227          EXPECTED   ng/mg creat   Norhydrocodone                 2628         EXPECTED   ng/mg creat    Sources of hydrocodone include scheduled prescription medications.    Hydromorphone, dihydrocodeine and norhydrocodone are expected    metabolites of hydrocodone. Hydromorphone and dihydrocodeine are    also available as scheduled prescription medications.  ==================================================================== Test                      Result    Flag   Units      Ref Range   Creatinine              105              mg/dL       >=20 ==================================================================== Declared Medications:  The flagging and interpretation on this report are based on the  following declared medications.  Unexpected results may arise from  inaccuracies in the declared medications.   **Note: The testing scope of this panel includes these medications:   Hydrocodone (Norco)   **Note: The testing scope of this panel does not include the  following reported medications:   Acetaminophen (Norco)  Azelastine (Astelin)  Fludrocortisone (Florinef)  Fluticasone (Flonase)  Hydrocortisone (Cortef)  Hyoscyamine  Meloxicam (Mobic)  Methenamine  Methylene Blue  Pseudoephedrine (Sudafed)  Sodium Biphosphate ==================================================================== For clinical consultation, please call [phone]. ====================================================================       ROS  Constitutional: Denies any fever or chills Gastrointestinal: No reported hemesis, hematochezia, vomiting, or acute GI distress Musculoskeletal: Denies any acute onset joint swelling, redness, loss of ROM, or weakness Neurological: No reported episodes of acute onset apraxia, aphasia, dysarthria, agnosia, amnesia, paralysis, loss of coordination, or loss of consciousness  Medication Review  HYDROcodone-acetaminophen, Urogesic-Blue, azelastine, ergocalciferol,  escitalopram, fludrocortisone, fluticasone, hydrocortisone, meloxicam, naloxone, pseudoephedrine, and tamsulosin  History Review  Allergy: Ms. Merta is allergic to sulfacetamide sodium (acne), tramadol, lisinopril, and sulfa antibiotics. Drug: Ms. Ades  reports no history of drug use. Alcohol:  reports that she does not currently use alcohol. Tobacco:  reports that she quit smoking about 27 years ago. Her smoking use included cigarettes. She has never used smokeless tobacco. Social: Ms. Debenedetto  reports that she quit smoking about  27 years ago. Her smoking use included cigarettes. She has never used smokeless tobacco. She reports that she does not currently use alcohol. She reports that she does not use drugs. Medical:  has a past medical history of Addison disease ([place]), Arthritis, Back pain, Complication of anesthesia, GERD (gastroesophageal reflux disease), and History of kidney stones. Surgical: Ms. Ucci  has a past surgical history that includes Cholecystectomy (2010); Polypectomy; dental work; Dilation and curettage of uterus (2010); IR NEPHROSTOMY PLACEMENT LEFT (12/14/2020); Nephrolithotomy (Left, 12/14/2020); Wisdom tooth extraction; Diagnostic laparoscopy (2004); Cystoscopy/ureteroscopy/holmium laser/stent placement (Right, 06/28/2021); Cystoscopy w/ ureteral stent removal (Right, 08/09/2021); Cystoscopy w/ retrogrades (Right, 08/09/2021); Cystoscopy/ureteroscopy/holmium laser/stent placement (Right, 08/09/2021); and Colonoscopy with propofol (N/A, 06/08/2022). Family: family history includes Alcohol abuse in her father; Arthritis in her mother; Brain cancer in her mother; Colon polyps (age of onset: 60) in her maternal grandmother; Hearing loss in her father; High blood pressure in her father and mother; Hyperlipidemia in her father and mother; Hypertension in her mother; Liver cancer in her mother; Lung cancer in her mother.  Laboratory Chemistry Profile   Renal Lab Results  Component Value Date   BUN 12 06/15/2022   CREATININE 0.82 06/15/2022   BCR 15 06/15/2022   GFR 84.36 08/26/2021   GFRAA 86 08/16/2018   GFRNONAA >60 12/15/2020    Hepatic Lab Results  Component Value Date   AST 24 06/15/2022   ALT 22 06/15/2022   ALBUMIN 4.0 06/15/2022   ALKPHOS 52 06/15/2022   LIPASE 30 10/06/2020    Electrolytes Lab Results  Component Value Date   NA 141 06/15/2022   K 4.1 06/15/2022   CL 105 06/15/2022   CALCIUM 9.3 06/15/2022   MG 2.1 08/16/2018    Bone Lab Results  Component Value Date   VD25OH 14.0 (L)  06/15/2022   25OHVITD1 67 08/16/2018   25OHVITD2 <1.0 08/16/2018   25OHVITD3 67 08/16/2018   TESTOSTERONE 1 (L) 08/13/2020    Inflammation (CRP: Acute Phase) (ESR: Chronic Phase) Lab Results  Component Value Date   CRP 2 08/16/2018   ESRSEDRATE 37 (H) 08/16/2018   LATICACIDVEN 1.4 10/06/2020         Note: Above Lab results reviewed.  Recent Imaging Review  CT RENAL STONE STUDY CLINICAL DATA:  Right flank pain. Microscopic hematuria. Nephrolithiasis.  EXAM: CT ABDOMEN AND PELVIS WITHOUT CONTRAST  TECHNIQUE: Multidetector CT imaging of the abdomen and pelvis was performed following the standard protocol without IV contrast.  RADIATION DOSE REDUCTION: This exam was performed according to the departmental dose-optimization program which includes automated exposure control, adjustment of the mA and/or kV according to patient size and/or use of iterative reconstruction technique.  COMPARISON:  10/06/2020  FINDINGS: Lower chest: No acute findings.  Hepatobiliary: No mass visualized on this unenhanced exam. Prior cholecystectomy. No evidence of biliary obstruction.  Pancreas: No mass or inflammatory process visualized on this unenhanced exam.  Spleen:  Within normal limits in size.  Adrenals/Urinary tract: A few tiny less than 5 mm renal calculi are seen bilaterally.  No evidence of ureteral calculi or hydronephrosis. Unremarkable unopacified urinary bladder.  Stomach/Bowel: No evidence of obstruction, inflammatory process, or abnormal fluid collections. Normal appendix visualized. Diverticulosis is seen mainly involving the descending and sigmoid colon, however there is no evidence of diverticulitis.  Vascular/Lymphatic: No pathologically enlarged lymph nodes identified. No evidence of abdominal aortic aneurysm. Aortic atherosclerotic calcification incidentally noted.  Reproductive:  No mass or other significant abnormality.  Other:  None.  Musculoskeletal: No  suspicious bone lesions identified. Stable benign hemangioma in L3 vertebral body. Stable bilateral L5 pars defects, grade 1 anterolisthesis and degenerative disc disease at L5-S1.  IMPRESSION: Bilateral nephrolithiasis. No evidence of ureteral calculi, hydronephrosis, or other acute findings.  Colonic diverticulosis, without radiographic evidence of diverticulitis.  Electronically Signed   By: Marlaine Hind M.D.   On: 05/04/2022 11:58 Note: Reviewed        Physical Exam  General appearance: Well nourished, well developed, and well hydrated. In no apparent acute distress Mental status: Alert, oriented x 3 (person, place, & time)       Respiratory: No evidence of acute respiratory distress Eyes: PERLA Vitals: BP (!) 127/94   Pulse 94   Temp (!) 97.3 F (36.3 C)   Ht _0  (1.626 m)   Wt 182 lb (82.6 kg)   LMP 12/01/2020 (Approximate)   SpO2 98%   BMI 31.24 kg/m  BMI: Estimated body mass index is 31.24 kg/m as calculated from the following:   Height as of this encounter: _1  (1.626 m).   Weight as of this encounter: 182 lb (82.6 kg). Ideal: Ideal body weight: 54.7 kg (120 lb 9.5 oz) Adjusted ideal body weight: 65.8 kg (145 lb 2.5 oz)  Assessment   Diagnosis Status  1. Chronic pain syndrome   2. Chronic low back pain (1ry area of Pain) (Bilateral) (R>L) w/o sciatica   3. Chronic sacroiliac joint pain (2ry area of Pain) (Right)   4. Lumbar facet syndrome (Bilateral)   5. Chronic hip pain (Bilateral)   6. Pharmacologic therapy   7. Chronic use of opiate for therapeutic purpose   8. Encounter for medication management   9. Encounter for chronic pain management    Controlled Controlled Controlled   Updated Problems: No problems updated.  Plan of Care  Problem-specific:  No problem-specific Assessment & Plan notes found for this encounter.  Ms. Sicily Zaragoza has a current medication list which includes the following long-term medication(s): azelastine,  escitalopram, flonase sensimist, [START ON 07/02/2022] hydrocodone-acetaminophen, [START ON 08/01/2022] hydrocodone-acetaminophen, [START ON 08/31/2022] hydrocodone-acetaminophen, and meloxicam.  Pharmacotherapy (Medications Ordered): Meds ordered this encounter  Medications   HYDROcodone-acetaminophen (NORCO) 7.5-325 MG tablet    Sig: Take 1 tablet by mouth every 8 (eight) hours as needed for severe pain. Must last 30 days.    Dispense:  90 tablet    Refill:  0    DO NOT: delete (not duplicate); no partial-fill (will deny script to complete), no refill request (F/U required). DISPENSE: 1 day early if closed on fill date. WARN: No CNS-depressants within 8 hrs of med.   HYDROcodone-acetaminophen (NORCO) 7.5-325 MG tablet    Sig: Take 1 tablet by mouth every 8 (eight) hours as needed for severe pain. Must last 30 days.    Dispense:  90 tablet    Refill:  0    DO NOT: delete (not duplicate); no partial-fill (will deny script to complete), no refill request (F/U required). DISPENSE: 1 day early if closed on fill date. WARN:  No CNS-depressants within 8 hrs of med.   HYDROcodone-acetaminophen (NORCO) 7.5-325 MG tablet    Sig: Take 1 tablet by mouth every 8 (eight) hours as needed for severe pain. Must last 30 days.    Dispense:  90 tablet    Refill:  0    DO NOT: delete (not duplicate); no partial-fill (will deny script to complete), no refill request (F/U required). DISPENSE: 1 day early if closed on fill date. WARN: No CNS-depressants within 8 hrs of med.   naloxone (NARCAN) nasal spray 4 mg/0.1 mL    Sig: Place 1 spray into the nose as needed for up to 365 doses (for opioid-induced respiratory depresssion). In case of emergency (overdose), spray once into each nostril. If no response within 3 minutes, repeat application and call [phone].    Dispense:  1 each    Refill:  0    Instruct patient in proper use of device.   Orders:  No orders of the defined types were placed in this encounter.  Follow-up  plan:   Return in about 3 months (around 09/30/2022) for Eval-day (M,W), (F2F), (MM).     Interventional Therapies  Risk  Complexity Considerations:   Estimated body mass index is 30.9 kg/m as calculated from the following:   Height as of this encounter: _0  (1.626 m).   Weight as of this encounter: 180 lb (81.6 kg). WNL   Planned  Pending:      Under consideration:   Diagnostic L2-3 L-FCT BLK #1  Diagnostic bilateral SI joint Blk  Possible bilateral SI joint RFA  Diagnostic bilateral L5 TFESI  Diagnostic left-sided L2 TFESI    Completed:   Therapeutic bilateral lumbar facet MBB x3 (09/08/2020) (4-0) (100/100/60/60)  Therapeutic right lumbar facet RFA x2 (09/09/2021) (4-2) (100/100/90/>75)  Therapeutic left lumbar facet RFA x2 (10/07/2021) (4-0) (100/100/90/>75)  Therapeutic right IA hip inj. x2 (08/20/2020) (5-1) (100/100/80/80)  Therapeutic left IA hip inj. x1 (08/20/2020) (5-1) (100/100/80/80)  Therapeutic bilateral trochanteric bursa inj. x3 (04/07/2022)  (100/100/90/90)    Therapeutic  Palliative (PRN) options:   Palliative lumbar facet MBB   Palliative lumbar facet RFA  Palliative trochanteric bursa inj.    Pharmacotherapy:  Nonopioids transferred 05/11/2020: Baclofen and Mobic Recommendations:   None at this time.     Recent Visits Date Type Provider Dept  04/19/22 Office Visit Milinda Pointer, MD [facility]  04/07/22 Procedure visit Milinda Pointer, MD [facility]  Showing recent visits within past 90 days and meeting all other requirements Today's Visits Date Type Provider Dept  06/29/22 Office Visit Milinda Pointer, MD [facility]  Showing today's visits and meeting all other requirements Future Appointments Date Type Provider Dept  09/26/22 Appointment Milinda Pointer, MD [facility]  Showing future appointments within next 90 days and meeting all other requirements  I discussed the assessment and  treatment plan with the patient. The patient was provided an opportunity to ask questions and all were answered. The patient agreed with the plan and demonstrated an understanding of the instructions.  Patient advised to call back or seek an in-person evaluation if the symptoms or condition worsens.  Duration of encounter: 30 minutes.  Total time on encounter, as per AMA guidelines included both the face-to-face and non-face-to-face time personally spent by the physician and/or other qualified health care professional(s) on the day of the encounter (includes time in activities that require the physician or other qualified health care professional and does not include time in  activities normally performed by clinical staff). Physician's time may include the following activities when performed: preparing to see the patient (eg, review of tests, pre-charting review of records) obtaining and/or reviewing separately obtained history performing a medically appropriate examination and/or evaluation counseling and educating the patient/family/caregiver ordering medications, tests, or procedures referring and communicating with other health care professionals (when not separately reported) documenting clinical information in the electronic or other health record independently interpreting results (not separately reported) and communicating results to the patient/ family/caregiver care coordination (not separately reported)  Note by: Denise Cola, MD Date: 06/29/2022; Time: 11:50 AM

## 2022-06-29 ENCOUNTER — Encounter: Payer: Self-pay | Admitting: Pain Medicine

## 2022-06-29 ENCOUNTER — Ambulatory Visit: Payer: BC Managed Care – PPO | Attending: Pain Medicine | Admitting: Pain Medicine

## 2022-06-29 VITALS — BP 127/94 | HR 94 | Temp 97.3°F | Ht 64.0 in | Wt 182.0 lb

## 2022-06-29 DIAGNOSIS — M47816 Spondylosis without myelopathy or radiculopathy, lumbar region: Secondary | ICD-10-CM | POA: Insufficient documentation

## 2022-06-29 DIAGNOSIS — G8929 Other chronic pain: Secondary | ICD-10-CM | POA: Diagnosis not present

## 2022-06-29 DIAGNOSIS — Z79891 Long term (current) use of opiate analgesic: Secondary | ICD-10-CM | POA: Insufficient documentation

## 2022-06-29 DIAGNOSIS — M545 Low back pain, unspecified: Secondary | ICD-10-CM | POA: Diagnosis not present

## 2022-06-29 DIAGNOSIS — G894 Chronic pain syndrome: Secondary | ICD-10-CM | POA: Diagnosis not present

## 2022-06-29 DIAGNOSIS — M25552 Pain in left hip: Secondary | ICD-10-CM | POA: Diagnosis not present

## 2022-06-29 DIAGNOSIS — M25551 Pain in right hip: Secondary | ICD-10-CM | POA: Insufficient documentation

## 2022-06-29 DIAGNOSIS — M533 Sacrococcygeal disorders, not elsewhere classified: Secondary | ICD-10-CM | POA: Insufficient documentation

## 2022-06-29 DIAGNOSIS — Z79899 Other long term (current) drug therapy: Secondary | ICD-10-CM | POA: Insufficient documentation

## 2022-06-29 MED ORDER — HYDROCODONE-ACETAMINOPHEN 7.5-325 MG PO TABS: 1.0000 | ORAL_TABLET | Freq: Three times a day (TID) | ORAL | 0 refills | Status: DC | PRN

## 2022-06-29 MED ORDER — NALOXONE HCL 4 MG/0.1ML NA LIQD: 1.0000 | NASAL | 0 refills | Status: DC | PRN

## 2022-06-29 NOTE — Progress Notes (Signed)
Nursing Pain Medication Assessment:  Safety precautions to be maintained throughout the outpatient stay will include: orient to surroundings, keep bed in low position, maintain call bell within reach at all times, provide assistance with transfer out of bed and ambulation.  Medication Inspection Compliance: Pill count conducted under aseptic conditions, in front of the patient. Neither the pills nor the bottle was removed from the patient's sight at any time. Once count was completed pills were immediately returned to the patient in their original bottle.  Medication: Hydrocodone/APAP Pill/Patch Count:  7 of 90 pills remain Pill/Patch Appearance: Markings consistent with prescribed medication Bottle Appearance: Standard pharmacy container. Clearly labeled. Filled Date: 43 / 02 / 2023 Last Medication intake:  TodaySafety precautions to be maintained throughout the outpatient stay will include: orient to surroundings, keep bed in low position, maintain call bell within reach at all times, provide assistance with transfer out of bed and ambulation.

## 2022-06-29 NOTE — Patient Instructions (Signed)
____________________________________________________________________________________________  Patient Information update  To: All of our patients.  Re: Name change.  It has been made official that our current name, "[facility]"   will soon be changed to "[facility]".   The purpose of this change is to eliminate any confusion created by the concept of our practice being a "Medication Management Pain Clinic". In the past this has led to the misconception that we treat pain primarily by the use of prescription medications.  Nothing can be farther from the truth.   Understanding PAIN MANAGEMENT: To further understand what our practice does, you first have to understand that "Pain Management" is a subspecialty that requires additional training once a physician has completed their specialty training, which can be in either Anesthesia, Neurology, Psychiatry, or Physical Medicine and Rehabilitation (PMR). Each one of these contributes to the final approach taken by each physician to the management of their patient's pain. To be a "Pain Management Specialist" you must have first completed one of the specialty trainings below.  Anesthesiologists - trained in clinical pharmacology and interventional techniques such as nerve blockade and regional as well as central neuroanatomy. They are trained to block pain before, during, and after surgical interventions.  Neurologists - trained in the diagnosis and pharmacological treatment of complex neurological conditions, such as Multiple Sclerosis, Parkinson's, spinal cord injuries, and other systemic conditions that may be associated with symptoms that may include but are not limited to pain. They tend to rely primarily on the treatment of chronic pain using prescription medications.  Psychiatrist - trained in conditions affecting the psychosocial  wellbeing of patients including but not limited to depression, anxiety, schizophrenia, personality disorders, addiction, and other substance use disorders that may be associated with chronic pain. They tend to rely primarily on the treatment of chronic pain using prescription medications.   Physical Medicine and Rehabilitation (PMR) physicians, also known as physiatrists - trained to treat a wide variety of medical conditions affecting the brain, spinal cord, nerves, bones, joints, ligaments, muscles, and tendons. Their training is primarily aimed at treating patients that have suffered injuries that have caused severe physical impairment. Their training is primarily aimed at the physical therapy and rehabilitation of those patients. They may also work alongside orthopedic surgeons or neurosurgeons using their expertise in assisting surgical patients to recover after their surgeries.  INTERVENTIONAL PAIN MANAGEMENT is sub-subspecialty of Pain Management.  Our physicians are Board-certified in Anesthesia, Pain Management, and Interventional Pain Management.  This meaning that not only have they been trained and Board-certified in their specialty of Anesthesia, and subspecialty of Pain Management, but they have also received further training in the sub-subspecialty of Interventional Pain Management, in order to become Board-certified as INTERVENTIONAL PAIN MANAGEMENT SPECIALIST.    Mission: Our goal is to use our skills in  INTERVENTIONAL PAIN MANAGEMENT as alternatives to the chronic use of prescription opioid medications for the treatment of pain. To make this more clear, we have changed our name to reflect what we do and offer. We will continue to offer medication management assessment and recommendations, but we will not be taking over any patient's medication management.  ____________________________________________________________________________________________      _______________________________________________________________________  Medication Rules  Purpose: To inform patients, and their family members, of our medication rules and regulations.  Applies to: All patients receiving prescriptions from our practice (written or electronic).  Pharmacy of record: This is the pharmacy where your electronic prescriptions   will be sent. Make sure we have the correct one.  Electronic prescriptions: In compliance with the [place] Strengthen Opioid Misuse Prevention (STOP) Act of 2017 (Session Law 2017-74/H243), effective August 01, 2018, all controlled substances must be electronically prescribed. Written prescriptions, faxing, or calling prescriptions to a pharmacy will no longer be done.  Prescription refills: These will be provided only during in-person appointments. No medications will be renewed without a "face-to-face" evaluation with your provider. Applies to all prescriptions.  NOTE: The following applies primarily to controlled substances (Opioid* Pain Medications).   Type of encounter (visit): For patients receiving controlled substances, face-to-face visits are required. (Not an option and not up to the patient.)  Patient's responsibilities: Pain Pills: Bring all pain pills to every appointment (except for procedure appointments). Pill Bottles: Bring pills in original pharmacy bottle. Bring bottle, even if empty. Always bring the bottle of the most recent fill.  Medication refills: You are responsible for knowing and keeping track of what medications you are taking and when is it that you will need a refill. The day before your appointment: write a list of all prescriptions that need to be refilled. The day of the appointment: give the list to the admitting nurse. Prescriptions will be written only during appointments. No prescriptions will be written on procedure days. If you forget a medication: it will not be "Called in", "Faxed", or  "electronically sent". You will need to get another appointment to get these prescribed. No early refills. Do not call asking to have your prescription filled early. Partial  or short prescriptions: Occasionally your pharmacy may not have enough pills to fill your prescription.  NEVER ACCEPT a partial fill or a prescription that is short of the total amount of pills that you were prescribed.  With controlled substances the law allows 72 hours for the pharmacy to complete the prescription.  If the prescription is not completed within 72 hours, the pharmacist will require a new prescription to be written. This means that you will be short on your medicine and we WILL NOT send another prescription to complete your original prescription.  Instead, request the pharmacy to send a carrier to a nearby branch to get enough medication to provide you with your full prescription. Prescription Accuracy: You are responsible for carefully inspecting your prescriptions before leaving our office. Have the discharge nurse carefully go over each prescription with you, before taking them home. Make sure that your name is accurately spelled, that your address is correct. Check the name and dose of your medication to make sure it is accurate. Check the number of pills, and the written instructions to make sure they are clear and accurate. Make sure that you are given enough medication to last until your next medication refill appointment. Taking Medication: Take medication as prescribed. When it comes to controlled substances, taking less pills or less frequently than prescribed is permitted and encouraged. Never take more pills than instructed. Never take the medication more frequently than prescribed.  Inform other Doctors: Always inform, all of your healthcare providers, of all the medications you take. Pain Medication from other Providers: You are not allowed to accept any additional pain medication from any other Doctor or  Healthcare provider. There are two exceptions to this rule. (see below) In the event that you require additional pain medication, you are responsible for notifying us, as stated below. Cough Medicine: Often these contain an opioid, such as codeine or hydrocodone. Never accept or take cough medicine containing   these opioids if you are already taking an opioid* medication. The combination may cause respiratory failure and death. Medication Agreement: You are responsible for carefully reading and following our Medication Agreement. This must be signed before receiving any prescriptions from our practice. Safely store a copy of your signed Agreement. Violations to the Agreement will result in no further prescriptions. (Additional copies of our Medication Agreement are available upon request.) Laws, Rules, & Regulations: All patients are expected to follow all Federal and State Laws, Statutes, Rules, & Regulations. Ignorance of the Laws does not constitute a valid excuse.  Illegal drugs and Controlled Substances: The use of illegal substances (including, but not limited to marijuana and its derivatives) and/or the illegal use of any controlled substances is strictly prohibited. Violation of this rule may result in the immediate and permanent discontinuation of any and all prescriptions being written by our practice. The use of any illegal substances is prohibited. Adopted CDC guidelines & recommendations: Target dosing levels will be at or below 60 MME/day. Use of benzodiazepines** is not recommended.  Exceptions: There are only two exceptions to the rule of not receiving pain medications from other Healthcare Providers. Exception #1 (Emergencies): In the event of an emergency (i.e.: accident requiring emergency care), you are allowed to receive additional pain medication. However, you are responsible for: As soon as you are able, call our office [phone], at any time of the day or night, and leave a  message stating your name, the date and nature of the emergency, and the name and dose of the medication prescribed. In the event that your call is answered by a member of our staff, make sure to document and save the date, time, and the name of the person that took your information.  Exception #2 (Planned Surgery): In the event that you are scheduled by another doctor or dentist to have any type of surgery or procedure, you are allowed (for a period no longer than 30 days), to receive additional pain medication, for the acute post-op pain. However, in this case, you are responsible for picking up a copy of our "Post-op Pain Management for Surgeons" handout, and giving it to your surgeon or dentist. This document is available at our office, and does not require an appointment to obtain it. Simply go to our office during business hours (Monday-Thursday from 8:00 AM to 4:00 PM) (Friday 8:00 AM to 12:00 Noon) or if you have a scheduled appointment with us, prior to your surgery, and ask for it by name. In addition, you are responsible for: calling our office [phone], at any time of the day or night, and leaving a message stating your name, name of your surgeon, type of surgery, and date of procedure or surgery. Failure to comply with your responsibilities may result in termination of therapy involving the controlled substances. Medication Agreement Violation. Following the above rules, including your responsibilities will help you in avoiding a Medication Agreement Violation ("Breaking your Pain Medication Contract").  Consequences:  Not following the above rules may result in permanent discontinuation of medication prescription therapy.  *Opioid medications include: morphine, codeine, oxycodone, oxymorphone, hydrocodone, hydromorphone, meperidine, tramadol, tapentadol, buprenorphine, fentanyl, methadone. **Benzodiazepine medications include: diazepam (Valium), alprazolam (Xanax), clonazepam (Klonopine),  lorazepam (Ativan), clorazepate (Tranxene), chlordiazepoxide (Librium), estazolam (Prosom), oxazepam (Serax), temazepam (Restoril), triazolam (Halcion) (Last updated: 05/24/2022) ______________________________________________________________________    ______________________________________________________________________  Medication Recommendations and Reminders  Applies to: All patients receiving prescriptions (written and/or electronic).  Medication Rules & Regulations: You are responsible   for reading, knowing, and following our "Medication Rules" document. These exist for your safety and that of others. They are not flexible and neither are we. Dismissing or ignoring them is an act of "non-compliance" that may result in complete and irreversible termination of such medication therapy. For safety reasons, "non-compliance" will not be tolerated. As with the [place] fundamental legal principle of "ignorance of the law is no defense", we will accept no excuses for not having read and knowing the content of documents provided to you by our practice.  Pharmacy of record:  Definition: This is the pharmacy where your electronic prescriptions will be sent.  We do not endorse any particular pharmacy. It is up to you and your insurance to decide what pharmacy to use.  We do not restrict you in your choice of pharmacy. However, once we write for your prescriptions, we will NOT be re-sending more prescriptions to fix restricted supply problems created by your pharmacy, or your insurance.  The pharmacy listed in the electronic medical record should be the one where you want electronic prescriptions to be sent. If you choose to change pharmacy, simply notify our nursing staff. Changes will be made only during your regular appointments and not over the phone.  Recommendations: Keep all of your pain medications in a safe place, under lock and key, even if you live alone. We will NOT replace lost, stolen, or  damaged medication. We do not accept "Police Reports" as proof of medications having been stolen. After you fill your prescription, take 1 week's worth of pills and put them away in a safe place. You should keep a separate, properly labeled bottle for this purpose. The remainder should be kept in the original bottle. Use this as your primary supply, until it runs out. Once it's gone, then you know that you have 1 week's worth of medicine, and it is time to come in for a prescription refill. If you do this correctly, it is unlikely that you will ever run out of medicine. To make sure that the above recommendation works, it is very important that you make sure your medication refill appointments are scheduled at least 1 week before you run out of medicine. To do this in an effective manner, make sure that you do not leave the office without scheduling your next medication management appointment. Always ask the nursing staff to show you in your prescription , when your medication will be running out. Then arrange for the receptionist to get you a return appointment, at least 7 days before you run out of medicine. Do not wait until you have 1 or 2 pills left, to come in. This is very poor planning and does not take into consideration that we may need to cancel appointments due to bad weather, sickness, or emergencies affecting our staff. DO NOT ACCEPT A "Partial Fill": If for any reason your pharmacy does not have enough pills/tablets to completely fill or refill your prescription, do not allow for a "partial fill". The law allows the pharmacy to complete that prescription within 72 hours, without requiring a new prescription. If they do not fill the rest of your prescription within those 72 hours, you will need a separate prescription to fill the remaining amount, which we will NOT provide. If the reason for the partial fill is your insurance, you will need to talk to the pharmacist about payment alternatives for  the remaining tablets, but again, DO NOT ACCEPT A PARTIAL FILL, unless you can trust   your pharmacist to obtain the remainder of the pills within 72 hours.  Prescription refills and/or changes in medication(s):  Prescription refills, and/or changes in dose or medication, will be conducted only during scheduled medication management appointments. (Applies to both, written and electronic prescriptions.) No refills on procedure days. No medication will be changed or started on procedure days. No changes, adjustments, and/or refills will be conducted on a procedure day. Doing so will interfere with the diagnostic portion of the procedure. No phone refills. No medications will be "called into the pharmacy". No Fax refills. No weekend refills. No Holliday refills. No after hours refills.  Remember:  Business hours are:  Monday to Thursday 8:00 AM to 4:00 PM Provider's Schedule: Areebah Meinders, MD - Appointments are:  Medication management: Monday and Wednesday 8:00 AM to 4:00 PM Procedure day: Tuesday and Thursday 7:30 AM to 4:00 PM Bilal Lateef, MD - Appointments are:  Medication management: Tuesday and Thursday 8:00 AM to 4:00 PM Procedure day: Monday and Wednesday 7:30 AM to 4:00 PM (Last update: 05/24/2022) ______________________________________________________________________    ____________________________________________________________________________________________  Pharmacy Shortages of Pain Medication   Introduction Shockingly as it may seem, .  "No U.S[place] Supreme Court decision has ever interpreted the Constitution as guaranteeing a right to health care for all Americans." - https://www.healthequityandpolicylab.com/elusive-right-to-health-care-under-us-law  "With respect to human rights, the [place] has no formally codified right to health, nor does it participate in a human rights treaty that specifies a right to health." - Scott J. Schweikart, JD,  MBE  Situation By now, most of our patients have had the experience of being told by their pharmacist that they do not have enough medication to cover their prescription. If you have not had this experience, just know that you soon will.  Problem There appears to be a shortage of these medications, either at the national level or locally. This is happening with all pharmacies. When there is not enough medication, patients are offered a partial fill and they are told that they will try to get the rest of the medicine for them at a later time. If they do not have enough for even a partial fill, the pharmacists are telling the patients to call us (the prescribing physicians) to request that we send another prescription to another pharmacy to get the medicine.   This reordering of a controlled substance creates documentation problems where additional paperwork needs to be created to explain why two prescriptions for the same period of time and the same medicine are being prescribed to the same patient. It also creates situations where the last appointment note does not accurately reflect when and what prescriptions were given to a patient. This leads to prescribing errors down the line, in subsequent follow-up visits.   [place] Board of Pharmacy (NCBOP) Research revealed that Board of Pharmacy Rule .1806 (21 NCAC 46.1806) authorizes pharmacists to the transfer of prescriptions among pharmacies, and it sets forth procedural and recordkeeping requirements for doing so. However, this requires the pharmacist to complete the previously mentioned procedural paperwork to accomplish the transfer. As it turns out, it is much easier for them to have the prescribing physicians do the work.   Possible solutions 1. You can ask your physician to assist you in weaning yourself off these medications. 2. Ask your pharmacy if the medication is in stock, 3 days prior to your refill. 3. If you need a pharmacy change,  let us know at your medication management visit. Prescriptions that have already been   electronically sent to a pharmacy will not be re-sent to a different pharmacy if your pharmacy of record does not have it in stock. Proper stocking of medication is a pharmacy problem, not a prescriber problem. Work with your pharmacist to solve the problem. 4. Have the [place] State Assembly add a provision to the "STOP ACT" (the law that mandates how controlled substances are prescribed) where there is an exception to the electronic prescribing rule that states that in the event there are shortages of medications the physicians are allowed to use written prescriptions as opposed to electronic ones. This would allow patients to take their prescriptions to a different pharmacy that may have enough medication available to fill the prescription. The problem is that currently there is a law that does not allow for written prescriptions, with the exception of instances where the electronic medical record is down due to technical issues.  5. Have [place] Congress ease the pressure on pharmaceutical companies, allowing them to produce enough quantities of the medication to adequately supply the population. 6. Have pharmacies keep enough stocks of these medications to cover their client base.  7. Have the [place] State Assembly add a provision to the "STOP ACT" where they ease the regulations surrounding the transfer of controlled substances between pharmacies, so as to simplify the transfer of supplies. As an alternative, develop a system to allow patients to obtain the remainder of their prescription at another one of their pharmacies or at an associate pharmacy.   How this shortage will affect you.  Understand that this is a pharmacy supply problem, not a prescriber problem. Work with your pharmacy to solve it. The job of the prescriber is to evaluate and monitor the patient for the appropriate indications and use of  these medicines. It is not the job of the prescriber to supply the medication or to solve problems with that supply. The responsibility and the choice to obtain the medication resides on the patient. By law, supplying the medication is the job of the pharmacy. It is certainly not the job of the prescriber to solve supply problems.   Due to the above problems we are no longer taking patients to write for their pain medication. Future discussions with your physician may include potentially weaning medications or transitioning to alternatives.  We will be focusing primarily on interventional based pain management. We will continue to evaluate for appropriate indications and we may provide recommendations regarding medication, dose, and schedule, as well as monitoring recommendations, however, we will not be taking over the actual prescribing of these substances. On those patients where we are treating their chronic pain with interventional therapies, exceptions will be considered on a case by case basis. At this time, we will try to continue providing this supplemental service to those patients we have been managing in the past. However, as of August 1st, 2023, we no longer will be sending additional prescriptions to other pharmacies for the purpose of solving their supply problems. Once we send a prescription to a pharmacy, we will not be resending it again to another pharmacy to cover for their shortages.   What to do. Write as many letters as you can. Recruit the help of family members in writing these letters. Below are some of the places where you can write to make your voice heard. Let them know what the problem is and push them to look for solutions.   Search internet for: "[place] find your legislators" https://www.ncleg.gov/findyourlegislators  Search   internet for: "[place] insurance commissioner  complaints" https://www.ncdoi.gov/contactscomplaints/assistance-or-file-complaint  Search internet for: "[place] Board of Pharmacy complaints" http://www.ncbop.org/contact.htm  Search internet for: "[facility] pharmacy complaints" Email [facility] Customer Relations https://www.[facility].com/help/email-[email or web address].jsp?callType=store  Search internet for: "[facility] pharmacy customer service complaints" https://www.[facility].com/topic/marketing/contactus/contactus_customerservice.jsp  ____________________________________________________________________________________________     ____________________________________________________________________________________________  Drug Holidays (Slow)  What is a "Drug Holiday"? Drug Holiday: is the name given to the period of time during which a patient stops taking a medication(s) for the purpose of eliminating tolerance to the drug.  Benefits Improved effectiveness of opioid medication. Decreased opioid dose needed to achieve benefits. Improved pain with lesser dose. Ending dependence on high dose therapy. Possible decrease in cost of therapy. It may uncover "opioid-induced hyperalgesia". (OIH)  What is "opioid hyperalgesia"? It is an increased paradoxical pain sensitization state caused by exposure to opioids, whereby a patient receiving opioids for the treatment of pain could actually become more sensitive to a painful stimuli. Stopping the opioid pain medication, contrary to the expected increase in the pain, it actually decreases or completely eliminates the pain. Ref.: "A comprehensive review of opioid-induced hyperalgesia". Marion Lee, et.al. Pain Physician. 2011 Mar-Apr;14(2):145-61.  What is tolerance? Tolerance: is the progressive decreased in effectiveness of a drug due to its repetitive use. With repetitive use, the body gets use to the medication and as a consequence, it loses its effectiveness. This is a common problem  seen with opioid pain medications. As a result, a larger dose of the drug is needed to achieve the same effect that used to be obtained with a smaller dose.  How long should a "Drug Holiday" last? Effectiveness depends on the patient staying off all opioid pain medicines for a minimum of 14 consecutive days. (2 weeks)  How about just taking less of the medicine? Does not work. This will not eliminate the excess receptors.  How about switching to a different pain medicine? (AKA. "Opioid rotation") This "technique" was promoted by studies funded by pharmaceutical companies, such as [employer], creators of "OxyContin". It does not work. It only creates the illusion of effectiveness by taking advantage of inaccurate equivalent dose calculations between different opioid medications.   Should I stop the medicine "cold turkey"? No. You should always coordinate with your Pain Specialist so that he/she can provide you with the correct medication dose to make the transition as smoothly as possible.  How do I stop the medicine? Slowly. You will be instructed to decrease the daily amount of pills that you take by one (1) pill every seven (7) days. This is called a "slow downward taper" of your dose. For example: if you normally take four (4) pills per day, you will be asked to drop this dose to three (3) pills per day for seven (7) days, then to two (2) pills per day for seven (7) days, then to one (1) per day for seven (7) days, and at the end of those last seven (7) days, this is when the "Drug Holiday" would start.   How about withdrawals? Typically, what triggers withdrawals is the sudden stop of a high dose opioid medication. Withdrawals can usually be avoided by slowly decreasing the dose over a prolonged period of time. If you attempt to stop your medication abruptly, withdrawals may be possible.  What are withdrawals? Withdrawals: refers to the wide range of symptoms that occur after stopping or  dramatically reducing opiate drugs after heavy and prolonged use. Withdrawal symptoms do not occur to patients that use low dose opioids,   or those who take the medication sporadically. Contrary to benzodiazepine (example: Valium, Xanax, etc.) or alcohol withdrawals ("Delirium Tremens"), opioid withdrawals are not lethal. Withdrawals are the physical manifestation of the body getting rid of the excess receptors.  Withdrawal Symptoms Early symptoms of withdrawal may include: Agitation Anxiety Muscle aches Increased tearing Insomnia Runny nose Sweating Yawning  Late symptoms of withdrawal may include: Abdominal cramping Diarrhea Dilated pupils Goose bumps Nausea Vomiting  Will I experience withdrawals? Due to the slow nature of the taper, it is very unlikely.  What is a slow taper? Taper: refers to the gradual decrease in dose.  (Last update: 06/27/2022) ____________________________________________________________________________________________    ____________________________________________________________________________________________  CBD (cannabidiol) & Delta-8 (Delta-8 tetrahydrocannabinol) WARNING  Intro: Cannabidiol (CBD) and tetrahydrocannabinol (THC), are two natural compounds found in plants of the Cannabis genus. They can both be extracted from hemp or cannabis. Hemp and cannabis come from the Cannabis sativa plant. Both compounds interact with your body's endocannabinoid system, but they have very different effects. CBD does not produce the high sensation associated with cannabis. Delta-8 tetrahydrocannabinol, also known as delta-8 THC, is a psychoactive substance found in the Cannabis sativa plant, of which marijuana and hemp are two varieties. THC is responsible for the high associated with the illicit use of marijuana.  Applicable to: All individuals currently taking or considering taking CBD (cannabidiol) and, more important, all patients taking opioid analgesic  controlled substances (pain medication). (Example: oxycodone; oxymorphone; hydrocodone; hydromorphone; morphine; methadone; tramadol; tapentadol; fentanyl; buprenorphine; butorphanol; dextromethorphan; meperidine; codeine; etc.)  Legal status: CBD remains a Schedule I drug prohibited for any use. CBD is illegal with one exception. In the [place], CBD has a limited Food and Drug Administration (FDA) approval for the treatment of two specific types of epilepsy disorders. Only one CBD product has been approved by the FDA for this purpose: "Epidiolex". FDA is aware that some companies are marketing products containing cannabis and cannabis-derived compounds in ways that violate the Federal Food, Drug and Cosmetic Act (FD&C Act) and that may put the health and safety of consumers at risk. The FDA, a Federal agency, has not enforced the CBD status since 2018. UPDATE: (09/17/2021) The Drug Enforcement Agency (DEA) issued a letter stating that "delta" cannabinoids, including Delta-8-THCO and Delta-9-THCO, synthetically derived from hemp do not qualify as hemp and will be viewed as Schedule I drugs. (Schedule I drugs, substances, or chemicals are defined as drugs with no currently accepted medical use and a high potential for abuse. Some examples of Schedule I drugs are: heroin, lysergic acid diethylamide (LSD), marijuana (cannabis), 3,4-methylenedioxymethamphetamine (ecstasy), methaqualone, and peyote.) (https://www.dea.gov)  Legality: Some manufacturers ship CBD products nationally, which is illegal. Often such products are sold online and are therefore available throughout the country. CBD is openly sold in head shops and health food stores in some states where such sales have not been explicitly legalized. Selling unapproved products with unsubstantiated therapeutic claims is not only a violation of the law, but also can put patients at risk, as these products have not been proven to be safe or effective.  Federal illegality makes it difficult to conduct research on CBD.  Reference: "FDA Regulation of Cannabis and Cannabis-Derived Products, Including Cannabidiol (CBD)" - https://www.fda.gov/news-events/public-health-focus/fda-regulation-cannabis-and-cannabis-derived-products-including-cannabidiol-cbd  Warning: CBD is not FDA approved and has not undergo the same manufacturing controls as prescription drugs.  This means that the purity and safety of available CBD may be questionable. Most of the time, despite manufacturer's claims, it is contaminated with THC (delta-9-tetrahydrocannabinol - the chemical   in marijuana responsible for the "HIGH").  When this is the case, the THC contaminant will trigger a positive urine drug screen (UDS) test for Marijuana (carboxy-THC). Because a positive UDS for any illicit substance is a violation of our medication agreement, your opioid analgesics (pain medicine) may be permanently discontinued. The FDA recently put out a warning about 5 things that everyone should be aware of regarding Delta-8 THC: Delta-8 THC products have not been evaluated or approved by the FDA for safe use and may be marketed in ways that put the public health at risk. The FDA has received adverse event reports involving delta-8 THC-containing products. Delta-8 THC has psychoactive and intoxicating effects. Delta-8 THC manufacturing often involve use of potentially harmful chemicals to create the concentrations of delta-8 THC claimed in the marketplace. The final delta-8 THC product may have potentially harmful by-products (contaminants) due to the chemicals used in the process. Manufacturing of delta-8 THC products may occur in uncontrolled or unsanitary settings, which may lead to the presence of unsafe contaminants or other potentially harmful substances. Delta-8 THC products should be kept out of the reach of children and pets.  MORE ABOUT CBD  General Information: CBD was discovered in 1940  and it is a derivative of the cannabis sativa genus plants (Marijuana and Hemp). It is one of the 113 identified substances found in Marijuana. It accounts for up to 40% of the plant's extract. As of 2018, preliminary clinical studies on CBD included research for the treatment of anxiety, movement disorders, and pain. CBD is available and consumed in multiple forms, including inhalation of smoke or vapor, as an aerosol spray, and by mouth. It may be supplied as an oil containing CBD, capsules, dried cannabis, or as a liquid solution. CBD is thought not to be as psychoactive as THC (delta-9-tetrahydrocannabinol - the chemical in marijuana responsible for the "HIGH"). Studies suggest that CBD may interact with different biological target receptors in the body, including cannabinoid and other neurotransmitter receptors. As of 2018 the mechanism of action for its biological effects has not been determined.  Side-effects  Adverse reactions: Dry mouth, diarrhea, decreased appetite, fatigue, drowsiness, malaise, weakness, sleep disturbances, and others.  Drug interactions: CBC may interact with other medications such as blood-thinners. Because CBD causes drowsiness on its own, it also increases the drowsiness caused by other medications, including antihistamines (such as Benadryl), benzodiazepines (Xanax, Ativan, Valium), antipsychotics, antidepressants and opioids, as well as alcohol and supplements such as kava, melatonin and St. John's Wort. Be cautious with the following combinations:   Brivaracetam (Briviact) Brivaracetam is changed and broken down by the body. CBD might decrease how quickly the body breaks down brivaracetam. This might increase levels of brivaracetam in the body.  Caffeine Caffeine is changed and broken down by the body. CBD might decrease how quickly the body breaks down caffeine. This might increase levels of caffeine in the body.  Carbamazepine (Tegretol) Carbamazepine is changed and  broken down by the body. CBD might decrease how quickly the body breaks down carbamazepine. This might increase levels of carbamazepine in the body and increase its side effects.  Citalopram (Celexa) Citalopram is changed and broken down by the body. CBD might decrease how quickly the body breaks down citalopram. This might increase levels of citalopram in the body and increase its side effects.  Clobazam (Onfi) Clobazam is changed and broken down by the liver. CBD might decrease how quickly the liver breaks down clobazam. This might increase the effects and side effects   of clobazam.  Eslicarbazepine (Aptiom) Eslicarbazepine is changed and broken down by the body. CBD might decrease how quickly the body breaks down eslicarbazepine. This might increase levels of eslicarbazepine in the body by a small amount.  Everolimus (Zostress) Everolimus is changed and broken down by the body. CBD might decrease how quickly the body breaks down everolimus. This might increase levels of everolimus in the body.  Lithium Taking higher doses of CBD might increase levels of lithium. This can increase the risk of lithium toxicity.  Medications changed by the liver (Cytochrome P450 1A1 (CYP1A1) substrates) Some medications are changed and broken down by the liver. CBD might change how quickly the liver breaks down these medications. This could change the effects and side effects of these medications.  Medications changed by the liver (Cytochrome P450 1A2 (CYP1A2) substrates) Some medications are changed and broken down by the liver. CBD might change how quickly the liver breaks down these medications. This could change the effects and side effects of these medications.  Medications changed by the liver (Cytochrome P450 1B1 (CYP1B1) substrates) Some medications are changed and broken down by the liver. CBD might change how quickly the liver breaks down these medications. This could change the effects and side  effects of these medications.  Medications changed by the liver (Cytochrome P450 2A6 (CYP2A6) substrates) Some medications are changed and broken down by the liver. CBD might change how quickly the liver breaks down these medications. This could change the effects and side effects of these medications.  Medications changed by the liver (Cytochrome P450 2B6 (CYP2B6) substrates) Some medications are changed and broken down by the liver. CBD might change how quickly the liver breaks down these medications. This could change the effects and side effects of these medications.  Medications changed by the liver (Cytochrome P450 2C19 (CYP2C19) substrates) Some medications are changed and broken down by the liver. CBD might change how quickly the liver breaks down these medications. This could change the effects and side effects of these medications.  Medications changed by the liver (Cytochrome P450 2C8 (CYP2C8) substrates) Some medications are changed and broken down by the liver. CBD might change how quickly the liver breaks down these medications. This could change the effects and side effects of these medications.  Medications changed by the liver (Cytochrome P450 2C9 (CYP2C9) substrates) Some medications are changed and broken down by the liver. CBD might change how quickly the liver breaks down these medications. This could change the effects and side effects of these medications.  Medications changed by the liver (Cytochrome P450 2D6 (CYP2D6) substrates) Some medications are changed and broken down by the liver. CBD might change how quickly the liver breaks down these medications. This could change the effects and side effects of these medications.  Medications changed by the liver (Cytochrome P450 2E1 (CYP2E1) substrates) Some medications are changed and broken down by the liver. CBD might change how quickly the liver breaks down these medications. This could change the effects and side effects  of these medications.  Medications changed by the liver (Cytochrome P450 3A4 (CYP3A4) substrates) Some medications are changed and broken down by the liver. CBD might change how quickly the liver breaks down these medications. This could change the effects and side effects of these medications.  Medications changed by the liver (Glucuronidated drugs) Some medications are changed and broken down by the liver. CBD might change how quickly the liver breaks down these medications. This could change the effects and side effects   of these medications.  Medications that decrease the breakdown of other medications by the liver (Cytochrome P450 2C19 (CYP2C19) inhibitors) CBD is changed and broken down by the liver. Some drugs decrease how quickly the liver changes and breaks down CBD. This could change the effects and side effects of CBD.  Medications that decrease the breakdown of other medications in the liver (Cytochrome P450 3A4 (CYP3A4) inhibitors) CBD is changed and broken down by the liver. Some drugs decrease how quickly the liver changes and breaks down CBD. This could change the effects and side effects of CBD.  Medications that increase breakdown of other medications by the liver (Cytochrome P450 3A4 (CYP3A4) inducers) CBD is changed and broken down by the liver. Some drugs increase how quickly the liver changes and breaks down CBD. This could change the effects and side effects of CBD.  Medications that increase the breakdown of other medications by the liver (Cytochrome P450 2C19 (CYP2C19) inducers) CBD is changed and broken down by the liver. Some drugs increase how quickly the liver changes and breaks down CBD. This could change the effects and side effects of CBD.  Methadone (Dolophine) Methadone is broken down by the liver. CBD might decrease how quickly the liver breaks down methadone. Taking cannabidiol along with methadone might increase the effects and side effects of  methadone.  Rufinamide (Banzel) Rufinamide is changed and broken down by the body. CBD might decrease how quickly the body breaks down rufinamide. This might increase levels of rufinamide in the body by a small amount.  Sedative medications (CNS depressants) CBD might cause sleepiness and slowed breathing. Some medications, called sedatives, can also cause sleepiness and slowed breathing. Taking CBD with sedative medications might cause breathing problems and/or too much sleepiness.  Sirolimus (Rapamune) Sirolimus is changed and broken down by the body. CBD might decrease how quickly the body breaks down sirolimus. This might increase levels of sirolimus in the body.  Stiripentol (Diacomit) Stiripentol is changed and broken down by the body. CBD might decrease how quickly the body breaks down stiripentol. This might increase levels of stiripentol in the body and increase its side effects.  Tacrolimus (Prograf) Tacrolimus is changed and broken down by the body. CBD might decrease how quickly the body breaks down tacrolimus. This might increase levels of tacrolimus in the body.  Tamoxifen (Soltamox) Tamoxifen is changed and broken down by the body. CBD might affect how quickly the body breaks down tamoxifen. This might affect levels of tamoxifen in the body.  Topiramate (Topamax) Topiramate is changed and broken down by the body. CBD might decrease how quickly the body breaks down topiramate. This might increase levels of topiramate in the body by a small amount.  Valproate Valproic acid can cause liver injury. Taking cannabidiol with valproic acid might increase the chance of liver injury. CBD and/or valproic acid might need to be stopped, or the dose might need to be reduced.  Warfarin (Coumadin) CBD might increase levels of warfarin, which can increase the risk for bleeding. CBD and/or warfarin might need to be stopped, or the dose might need to be reduced.  Zonisamide Zonisamide is  changed and broken down by the body. CBD might decrease how quickly the body breaks down zonisamide. This might increase levels of zonisamide in the body by a small amount. (Last update: 09/29/2021) ____________________________________________________________________________________________   ____________________________________________________________________________________________  Naloxone Nasal Spray  Why am I receiving this medication? [place] STOP ACT requires that all patients taking high dose opioids or at   risk of opioids respiratory depression, be prescribed an opioid reversal agent, such as Naloxone (AKA: Narcan).  What is this medication? NALOXONE (nal OX one) treats opioid overdose, which causes slow or shallow breathing, severe drowsiness, or trouble staying awake. Call emergency services after using this medication. You may need additional treatment. Naloxone works by reversing the effects of opioids. It belongs to a group of medications called opioid blockers.  COMMON BRAND NAME(S): Kloxxado, Narcan  What should I tell my care team before I take this medication? They need to know if you have any of these conditions: Heart disease Substance use disorder An unusual or allergic reaction to naloxone, other medications, foods, dyes, or preservatives Pregnant or trying to get pregnant Breast-feeding  When to use this medication? This medication is to be used for the treatment of respiratory depression (less than 8 breaths per minute) secondary to opioid overdose.   How to use this medication? This medication is for use in the nose. Lay the person on their back. Support their neck with your hand and allow the head to tilt back before giving the medication. The nasal spray should be given into 1 nostril. After giving the medication, move the person onto their side. Do not remove or test the nasal spray until ready to use. Get emergency medical help right away after giving  the first dose of this medication, even if the person wakes up. You should be familiar with how to recognize the signs and symptoms of a narcotic overdose. If more doses are needed, give the additional dose in the other nostril. Talk to your care team about the use of this medication in children. While this medication may be prescribed for children as young as newborns for selected conditions, precautions do apply.  Naloxone Overdosage: If you think you have taken too much of this medicine contact a poison control center or emergency room at once.  NOTE: This medicine is only for you. Do not share this medicine with others.  What if I miss a dose? This does not apply.  What may interact with this medication? This is only used during an emergency. No interactions are expected during emergency use. This list may not describe all possible interactions. Give your health care provider a list of all the medicines, herbs, non-prescription drugs, or dietary supplements you use. Also tell them if you smoke, drink alcohol, or use illegal drugs. Some items may interact with your medicine.  What should I watch for while using this medication? Keep this medication ready for use in the case of an opioid overdose. Make sure that you have the phone number of your care team and local hospital ready. You may need to have additional doses of this medication. Each nasal spray contains a single dose. Some emergencies may require additional doses. After use, bring the treated person to the nearest hospital or call 911. Make sure the treating care team knows that the person has received a dose of this medication. You will receive additional instructions on what to do during and after use of this medication before an emergency occurs.  What side effects may I notice from receiving this medication? Side effects that you should report to your care team as soon as possible: Allergic reactions--skin rash, itching, hives,  swelling of the face, lips, tongue, or throat Side effects that usually do not require medical attention (report these to your care team if they continue or are bothersome): Constipation Dryness or irritation inside the nose Headache   Increase in blood pressure Muscle spasms Stuffy nose Toothache This list may not describe all possible side effects. Call your doctor for medical advice about side effects. You may report side effects to FDA at 1-800-FDA-1088.  Where should I keep my medication? Because this is an emergency medication, you should keep it with you at all times.  Keep out of the reach of children and pets. Store between 20 and 25 degrees C (68 and 77 degrees F). Do not freeze. Throw away any unused medication after the expiration date. Keep in original box until ready to use.  NOTE: This sheet is a summary. It may not cover all possible information. If you have questions about this medicine, talk to your doctor, pharmacist, or health care provider.   2023 Elsevier/Gold Standard (2021-03-26 00:00:00)  ____________________________________________________________________________________________   

## 2022-07-03 DIAGNOSIS — E559 Vitamin D deficiency, unspecified: Secondary | ICD-10-CM | POA: Insufficient documentation

## 2022-07-03 DIAGNOSIS — E782 Mixed hyperlipidemia: Secondary | ICD-10-CM | POA: Insufficient documentation

## 2022-07-03 DIAGNOSIS — E785 Hyperlipidemia, unspecified: Secondary | ICD-10-CM | POA: Insufficient documentation

## 2022-07-05 ENCOUNTER — Other Ambulatory Visit: Payer: Self-pay

## 2022-07-05 ENCOUNTER — Telehealth: Payer: Self-pay | Admitting: Pain Medicine

## 2022-07-05 DIAGNOSIS — M545 Low back pain, unspecified: Secondary | ICD-10-CM

## 2022-07-05 DIAGNOSIS — Z79899 Other long term (current) drug therapy: Secondary | ICD-10-CM

## 2022-07-05 DIAGNOSIS — G8929 Other chronic pain: Secondary | ICD-10-CM

## 2022-07-05 DIAGNOSIS — G894 Chronic pain syndrome: Secondary | ICD-10-CM

## 2022-07-05 DIAGNOSIS — M47816 Spondylosis without myelopathy or radiculopathy, lumbar region: Secondary | ICD-10-CM

## 2022-07-05 DIAGNOSIS — Z79891 Long term (current) use of opiate analgesic: Secondary | ICD-10-CM

## 2022-07-05 MED ORDER — HYDROCODONE-ACETAMINOPHEN 7.5-325 MG PO TABS: 1.0000 | ORAL_TABLET | Freq: Three times a day (TID) | ORAL | 0 refills | Status: DC | PRN

## 2022-07-05 NOTE — Telephone Encounter (Signed)
Sent to Dr Delane Ginger for a refill

## 2022-07-05 NOTE — Telephone Encounter (Signed)
PT stated that Denise Burns is currently out of her medications. PT will like for prescription of Hydrocodone 7.5 -325 to be send to  the [facility] on [address]. [phone]. Please give patient a call. Thanks

## 2022-07-15 ENCOUNTER — Other Ambulatory Visit: Payer: Self-pay

## 2022-07-15 ENCOUNTER — Other Ambulatory Visit: Payer: Self-pay | Admitting: Nurse Practitioner

## 2022-07-15 DIAGNOSIS — F411 Generalized anxiety disorder: Secondary | ICD-10-CM

## 2022-07-15 MED ORDER — AZELASTINE HCL 0.1 % NA SOLN
1.0000 | Freq: Two times a day (BID) | NASAL | 1 refills | Status: DC
  Filled 2022-07-15: qty 30, 30d supply, fill #0
  Filled 2022-08-15: qty 30, 30d supply, fill #1

## 2022-07-15 MED ORDER — ESCITALOPRAM OXALATE 5 MG PO TABS
5.0000 mg | ORAL_TABLET | Freq: Every day | ORAL | 1 refills | Status: DC
  Filled 2022-07-15: qty 30, 30d supply, fill #0
  Filled 2022-08-15: qty 30, 30d supply, fill #1

## 2022-08-15 ENCOUNTER — Other Ambulatory Visit: Payer: Self-pay

## 2022-08-23 NOTE — Progress Notes (Unsigned)
PROVIDER NOTE: Information contained herein reflects review and annotations entered in association with encounter. Interpretation of such information and data should be left to medically-trained personnel. Information provided to patient can be located elsewhere in the medical record under "Patient Instructions". Document created using STT-dictation technology, any transcriptional errors that may result from process are unintentional.    Patient: Denise Burns  Service Category: E/M  Provider: [name], MD  DOB: 08-Jun-1971  DOS: 08/24/2022  Referring Provider: [name], NP  MRN: [id number]  Specialty: Interventional Pain Management  PCP: [name], NP  Type: Established Patient  Setting: Ambulatory outpatient    Location: Office  Delivery: Face-to-face     HPI  Denise Burns, a 52 y.o. year old female, is here today because of her Chronic right sacroiliac joint pain [M53.3, G89.29]. Denise Burns's primary complain today is No chief complaint on file. Last encounter: My last encounter with her was on 07/05/2022. Pertinent problems: Denise Burns has Chronic midline low back pain with bilateral sciatica; Chronic low back pain (1ry area of Pain) (Bilateral) (R>L) w/o sciatica; Chronic sacroiliac joint pain (2ry area of Pain) (Right); Chronic pain syndrome; Abnormal MRI, lumbar spine (03/22/2018); Lumbar facet hypertrophy (Multilevel) (Bilateral); Lumbar facet syndrome (Bilateral); DDD (degenerative disc disease), lumbosacral; Spondylolisthesis (8 mm) at L5-S1 level; Lumbar Grade 1 Retrolisthesis (2 mm) of L2/L3; L5-S1 pars defect w/ spondylolisthesis (Bilateral); Lumbar pars defect (L5-S1) (Bilateral); Lumbar foraminal stenosis (Left: L2-3) (Bilateral: L5-S1); Osteoarthritis of sacroiliac joint (Bilateral); Chronic musculoskeletal pain; Neurogenic pain; Osteoarthritis of facet joint of lumbar spine; Greater trochanteric bursitis (Bilateral); Lumbar spondylosis;  Spondylosis without myelopathy or radiculopathy, lumbosacral region; Chronic hip pain (Right); Osteoarthritis of hip (Right); Lumbar radiculitis (L1/L2) (Right); Chronic groin pain (Right); Chronic hip pain (Bilateral); Enthesopathy hip region (Left); Osteoarthritis of hips (Bilateral); Kissing spine syndrome; Chronic low back pain (Midline) w/o sciatica; and Trigger point with back pain (PSIS area) (Right) on their pertinent problem list. Pain Assessment: Severity of   is reported as a  /10. Location:    / . Onset:  . Quality:  . Timing:  . Modifying factor(s):  Marland Kitchen Vitals:  vitals were not taken for this visit.  BMI: Estimated body mass index is 31.24 kg/m as calculated from the following:   Height as of 06/29/22: '5\' 4"'$  (1.626 m).   Weight as of 06/29/22: 182 lb (82.6 kg).  Reason for encounter: evaluation of worsening, or previously known (established) problem. ***  Pharmacotherapy Assessment  Analgesic: Hydrocodone/APAP 7.5/325 1 tablet p.o. 3 times daily (22.5 mg/day of hydrocodone)  MME/day: 22.5 mg/day.   Monitoring: [place] PMP: PDMP reviewed during this encounter.       Pharmacotherapy: No side-effects or adverse reactions reported. Compliance: No problems identified. Effectiveness: Clinically acceptable.  No notes on file  No results found for: "CBDTHCR" No results found for: "D8THCCBX" No results found for: "D9THCCBX"  UDS:  Summary  Date Value Ref Range Status  03/28/2022 Note  Final    Comment:    ==================================================================== [employer] [facility] (MW) ==================================================================== Test                             Result       Flag       Units  Drug Present and Declared for Prescription Verification   Hydrocodone                    2442  EXPECTED   ng/mg creat   Hydromorphone                  467          EXPECTED   ng/mg creat   Dihydrocodeine                 227          EXPECTED   ng/mg  creat   Norhydrocodone                 2628         EXPECTED   ng/mg creat    Sources of hydrocodone include scheduled prescription medications.    Hydromorphone, dihydrocodeine and norhydrocodone are expected    metabolites of hydrocodone. Hydromorphone and dihydrocodeine are    also available as scheduled prescription medications.  ==================================================================== Test                      Result    Flag   Units      Ref Range   Creatinine              105              mg/dL      >=20 ==================================================================== Declared Medications:  The flagging and interpretation on this report are based on the  following declared medications.  Unexpected results may arise from  inaccuracies in the declared medications.   **Note: The testing scope of this panel includes these medications:   Hydrocodone (Norco)   **Note: The testing scope of this panel does not include the  following reported medications:   Acetaminophen (Norco)  Azelastine (Astelin)  Fludrocortisone (Florinef)  Fluticasone (Flonase)  Hydrocortisone (Cortef)  Hyoscyamine  Meloxicam (Mobic)  Methenamine  Methylene Blue  Pseudoephedrine (Sudafed)  Sodium Biphosphate ==================================================================== For clinical consultation, please call [phone]. ====================================================================       ROS  Constitutional: Denies any fever or chills Gastrointestinal: No reported hemesis, hematochezia, vomiting, or acute GI distress Musculoskeletal: Denies any acute onset joint swelling, redness, loss of ROM, or weakness Neurological: No reported episodes of acute onset apraxia, aphasia, dysarthria, agnosia, amnesia, paralysis, loss of coordination, or loss of consciousness  Medication Review  Azelastine HCl, HYDROcodone-acetaminophen, Urogesic-Blue, ergocalciferol, escitalopram,  fludrocortisone, fluticasone, hydrocortisone, meloxicam, naloxone, pseudoephedrine, and tamsulosin  History Review  Allergy: Denise Burns is allergic to sulfacetamide sodium (acne), tramadol, lisinopril, and sulfa antibiotics. Drug: Denise Burns  reports no history of drug use. Alcohol:  reports that she does not currently use alcohol. Tobacco:  reports that she quit smoking about 28 years ago. Her smoking use included cigarettes. She has never used smokeless tobacco. Social: Denise Burns  reports that she quit smoking about 28 years ago. Her smoking use included cigarettes. She has never used smokeless tobacco. She reports that she does not currently use alcohol. She reports that she does not use drugs. Medical:  has a past medical history of Addison disease ([place]), Arthritis, Back pain, Complication of anesthesia, GERD (gastroesophageal reflux disease), and History of kidney stones. Surgical: Denise Burns  has a past surgical history that includes Cholecystectomy (2010); Polypectomy; dental work; Dilation and curettage of uterus (2010); IR NEPHROSTOMY PLACEMENT LEFT (12/14/2020); Nephrolithotomy (Left, 12/14/2020); Wisdom tooth extraction; Diagnostic laparoscopy (2004); Cystoscopy/ureteroscopy/holmium laser/stent placement (Right, 06/28/2021); Cystoscopy w/ ureteral stent removal (Right, 08/09/2021); Cystoscopy w/ retrogrades (Right, 08/09/2021); Cystoscopy/ureteroscopy/holmium laser/stent placement (Right, 08/09/2021); and Colonoscopy with propofol (N/A, 06/08/2022). Family: family history includes Alcohol abuse  in her father; Arthritis in her mother; Brain cancer in her mother; Colon polyps (age of onset: 32) in her maternal grandmother; Hearing loss in her father; High blood pressure in her father and mother; Hyperlipidemia in her father and mother; Hypertension in her mother; Liver cancer in her mother; Lung cancer in her mother.  Laboratory Chemistry Profile   Renal Lab Results  Component Value Date    BUN 12 06/15/2022   CREATININE 0.82 06/15/2022   BCR 15 06/15/2022   GFR 84.36 08/26/2021   GFRAA 86 08/16/2018   GFRNONAA >60 12/15/2020    Hepatic Lab Results  Component Value Date   AST 24 06/15/2022   ALT 22 06/15/2022   ALBUMIN 4.0 06/15/2022   ALKPHOS 52 06/15/2022   LIPASE 30 10/06/2020    Electrolytes Lab Results  Component Value Date   NA 141 06/15/2022   K 4.1 06/15/2022   CL 105 06/15/2022   CALCIUM 9.3 06/15/2022   MG 2.1 08/16/2018    Bone Lab Results  Component Value Date   VD25OH 14.0 (L) 06/15/2022   25OHVITD1 67 08/16/2018   25OHVITD2 <1.0 08/16/2018   25OHVITD3 67 08/16/2018   TESTOSTERONE 1 (L) 08/13/2020    Inflammation (CRP: Acute Phase) (ESR: Chronic Phase) Lab Results  Component Value Date   CRP 2 08/16/2018   ESRSEDRATE 37 (H) 08/16/2018   LATICACIDVEN 1.4 10/06/2020         Note: Above Lab results reviewed.  Recent Imaging Review  CT RENAL STONE STUDY CLINICAL DATA:  Right flank pain. Microscopic hematuria. Nephrolithiasis.  EXAM: CT ABDOMEN AND PELVIS WITHOUT CONTRAST  TECHNIQUE: Multidetector CT imaging of the abdomen and pelvis was performed following the standard protocol without IV contrast.  RADIATION DOSE REDUCTION: This exam was performed according to the departmental dose-optimization program which includes automated exposure control, adjustment of the mA and/or kV according to patient size and/or use of iterative reconstruction technique.  COMPARISON:  10/06/2020  FINDINGS: Lower chest: No acute findings.  Hepatobiliary: No mass visualized on this unenhanced exam. Prior cholecystectomy. No evidence of biliary obstruction.  Pancreas: No mass or inflammatory process visualized on this unenhanced exam.  Spleen:  Within normal limits in size.  Adrenals/Urinary tract: A few tiny less than 5 mm renal calculi are seen bilaterally. No evidence of ureteral calculi or hydronephrosis. Unremarkable unopacified  urinary bladder.  Stomach/Bowel: No evidence of obstruction, inflammatory process, or abnormal fluid collections. Normal appendix visualized. Diverticulosis is seen mainly involving the descending and sigmoid colon, however there is no evidence of diverticulitis.  Vascular/Lymphatic: No pathologically enlarged lymph nodes identified. No evidence of abdominal aortic aneurysm. Aortic atherosclerotic calcification incidentally noted.  Reproductive:  No mass or other significant abnormality.  Other:  None.  Musculoskeletal: No suspicious bone lesions identified. Stable benign hemangioma in L3 vertebral body. Stable bilateral L5 pars defects, grade 1 anterolisthesis and degenerative disc disease at L5-S1.  IMPRESSION: Bilateral nephrolithiasis. No evidence of ureteral calculi, hydronephrosis, or other acute findings.  Colonic diverticulosis, without radiographic evidence of diverticulitis.  Electronically Signed   By: Marlaine Hind M.D.   On: 05/04/2022 11:58 Note: Reviewed        Physical Exam  General appearance: Well nourished, well developed, and well hydrated. In no apparent acute distress Mental status: Alert, oriented x 3 (person, place, & time)       Respiratory: No evidence of acute respiratory distress Eyes: PERLA Vitals: LMP 12/01/2020 (Approximate)  BMI: Estimated body mass index is 31.24 kg/m as calculated  from the following:   Height as of 06/29/22: '5\' 4"'$  (1.626 m).   Weight as of 06/29/22: 182 lb (82.6 kg). Ideal: Patient weight not recorded  Assessment   Diagnosis Status  1. Chronic sacroiliac joint pain (2ry area of Pain) (Right)   2. Lumbar Grade 1 Retrolisthesis (2 mm) of L2/L3   3. Osteoarthritis of sacroiliac joint (Bilateral)   4. Spondylolisthesis (8 mm) at L5-S1 level   5. Lumbar pars defect (L5-S1) (Bilateral)   6. Lumbar foraminal stenosis (Left: L2-3) (Bilateral: L5-S1)   7. L5-S1 pars defect w/ spondylolisthesis (Bilateral)   8. Abnormal  MRI, lumbar spine (03/22/2018)    Controlled Controlled Controlled   Updated Problems: No problems updated.  Plan of Care  Problem-specific:  No problem-specific Assessment & Plan notes found for this encounter.  Denise Burns has a current medication list which includes the following long-term medication(s): azelastine, escitalopram, flonase sensimist, hydrocodone-acetaminophen, [START ON 08/31/2022] hydrocodone-acetaminophen, hydrocodone-acetaminophen, and meloxicam.  Pharmacotherapy (Medications Ordered): No orders of the defined types were placed in this encounter.  Orders:  No orders of the defined types were placed in this encounter.  Follow-up plan:   No follow-ups on file.     Interventional Therapies  Risk  Complexity Considerations:   Estimated body mass index is 30.9 kg/m as calculated from the following:   Height as of this encounter: '5\' 4"'$  (1.626 m).   Weight as of this encounter: 180 lb (81.6 kg). WNL   Planned  Pending:      Under consideration:   Diagnostic L2-3 L-FCT BLK #1  Diagnostic bilateral SI joint Blk  Possible bilateral SI joint RFA  Diagnostic bilateral L5 TFESI  Diagnostic left-sided L2 TFESI    Completed:   Therapeutic bilateral lumbar facet MBB x3 (09/08/2020) (4-0) (100/100/60/60)  Therapeutic right lumbar facet RFA x2 (09/09/2021) (4-2) (100/100/90/>75)  Therapeutic left lumbar facet RFA x2 (10/07/2021) (4-0) (100/100/90/>75)  Therapeutic right IA hip inj. x2 (08/20/2020) (5-1) (100/100/80/80)  Therapeutic left IA hip inj. x1 (08/20/2020) (5-1) (100/100/80/80)  Therapeutic bilateral trochanteric bursa inj. x3 (04/07/2022)  (100/100/90/90)    Therapeutic  Palliative (PRN) options:   Palliative lumbar facet MBB   Palliative lumbar facet RFA  Palliative trochanteric bursa inj.    Pharmacotherapy:  Nonopioids transferred 05/11/2020: Baclofen and Mobic Recommendations:   None at this time.      Recent Visits Date Type  Provider Dept  06/29/22 Office Visit Milinda Pointer, MD [facility]  Showing recent visits within past 90 days and meeting all other requirements Future Appointments Date Type Provider Dept  08/24/22 Appointment Milinda Pointer, [facility]  09/26/22 Appointment Milinda Pointer, MD [facility]  Showing future appointments within next 90 days and meeting all other requirements  I discussed the assessment and treatment plan with the patient. The patient was provided an opportunity to ask questions and all were answered. The patient agreed with the plan and demonstrated an understanding of the instructions.  Patient advised to call back or seek an in-person evaluation if the symptoms or condition worsens.  Duration of encounter: *** minutes.  Total time on encounter, as per AMA guidelines included both the face-to-face and non-face-to-face time personally spent by the physician and/or other qualified health care professional(s) on the day of the encounter (includes time in activities that require the physician or other qualified health care professional and does not include time in activities normally performed by clinical staff). Physician's time may include the following activities  when performed: Preparing to see the patient (e.g., pre-charting review of records, searching for previously ordered imaging, lab work, and nerve conduction tests) Review of prior analgesic pharmacotherapies. Reviewing PMP Interpreting ordered tests (e.g., lab work, imaging, nerve conduction tests) Performing post-procedure evaluations, including interpretation of diagnostic procedures Obtaining and/or reviewing separately obtained history Performing a medically appropriate examination and/or evaluation Counseling and educating the patient/family/caregiver Ordering medications, tests, or procedures Referring and communicating with other health care professionals (when not  separately reported) Documenting clinical information in the electronic or other health record Independently interpreting results (not separately reported) and communicating results to the patient/ family/caregiver Care coordination (not separately reported)  Note by: [name], MD Date: 08/24/2022; Time: 2:28 PM

## 2022-08-24 ENCOUNTER — Ambulatory Visit
Admission: RE | Admit: 2022-08-24 | Discharge: 2022-08-24 | Disposition: A | Discharge status: Home / Self Care | Payer: BC Managed Care – PPO | Source: Ambulatory Visit | Attending: Pain Medicine | Admitting: Pain Medicine

## 2022-08-24 ENCOUNTER — Encounter: Payer: Self-pay | Admitting: Pain Medicine

## 2022-08-24 ENCOUNTER — Ambulatory Visit (HOSPITAL_BASED_OUTPATIENT_CLINIC_OR_DEPARTMENT_OTHER): Payer: BC Managed Care – PPO | Admitting: Pain Medicine

## 2022-08-24 VITALS — BP 158/97 | HR 105 | Temp 97.5°F | Resp 18 | Ht 64.0 in | Wt 170.0 lb

## 2022-08-24 DIAGNOSIS — M160 Bilateral primary osteoarthritis of hip: Secondary | ICD-10-CM | POA: Insufficient documentation

## 2022-08-24 DIAGNOSIS — R937 Abnormal findings on diagnostic imaging of other parts of musculoskeletal system: Secondary | ICD-10-CM | POA: Insufficient documentation

## 2022-08-24 DIAGNOSIS — M461 Sacroiliitis, not elsewhere classified: Secondary | ICD-10-CM | POA: Insufficient documentation

## 2022-08-24 DIAGNOSIS — M25552 Pain in left hip: Secondary | ICD-10-CM | POA: Insufficient documentation

## 2022-08-24 DIAGNOSIS — M47816 Spondylosis without myelopathy or radiculopathy, lumbar region: Secondary | ICD-10-CM | POA: Insufficient documentation

## 2022-08-24 DIAGNOSIS — M533 Sacrococcygeal disorders, not elsewhere classified: Secondary | ICD-10-CM

## 2022-08-24 DIAGNOSIS — N200 Calculus of kidney: Secondary | ICD-10-CM | POA: Insufficient documentation

## 2022-08-24 DIAGNOSIS — M4317 Spondylolisthesis, lumbosacral region: Secondary | ICD-10-CM | POA: Insufficient documentation

## 2022-08-24 DIAGNOSIS — M4310 Spondylolisthesis, site unspecified: Secondary | ICD-10-CM | POA: Insufficient documentation

## 2022-08-24 DIAGNOSIS — M48061 Spinal stenosis, lumbar region without neurogenic claudication: Secondary | ICD-10-CM | POA: Insufficient documentation

## 2022-08-24 DIAGNOSIS — M4306 Spondylolysis, lumbar region: Secondary | ICD-10-CM | POA: Insufficient documentation

## 2022-08-24 DIAGNOSIS — G8929 Other chronic pain: Secondary | ICD-10-CM | POA: Insufficient documentation

## 2022-08-24 DIAGNOSIS — M25551 Pain in right hip: Secondary | ICD-10-CM

## 2022-08-24 NOTE — Patient Instructions (Signed)

## 2022-08-24 NOTE — Progress Notes (Signed)
Safety precautions to be maintained throughout the outpatient stay will include: orient to surroundings, keep bed in low position, maintain call bell within reach at all times, provide assistance with transfer out of bed and ambulation.  

## 2022-08-30 ENCOUNTER — Encounter: Payer: Self-pay | Admitting: Internal Medicine

## 2022-08-30 ENCOUNTER — Other Ambulatory Visit: Payer: Self-pay

## 2022-08-30 ENCOUNTER — Ambulatory Visit: Payer: BC Managed Care – PPO | Admitting: Internal Medicine

## 2022-08-30 VITALS — BP 122/80 | HR 92 | Ht 64.0 in | Wt 183.0 lb

## 2022-08-30 DIAGNOSIS — E271 Primary adrenocortical insufficiency: Secondary | ICD-10-CM

## 2022-08-30 MED ORDER — FLUDROCORTISONE ACETATE 0.1 MG PO TABS
0.0500 mg | ORAL_TABLET | Freq: Every day | ORAL | 3 refills | Status: DC
  Filled 2022-08-30 – 2022-09-12 (×2): qty 45, 90d supply, fill #0
  Filled 2022-12-29: qty 45, 90d supply, fill #1
  Filled 2023-03-28: qty 45, 90d supply, fill #2
  Filled 2023-07-13: qty 45, 90d supply, fill #3

## 2022-08-30 MED ORDER — HYDROCORTISONE 10 MG PO TABS
15.0000 mg | ORAL_TABLET | Freq: Two times a day (BID) | ORAL | 3 refills | Status: DC
  Filled 2022-08-30: qty 200, 90d supply, fill #0
  Filled 2022-09-08: qty 100, 30d supply, fill #0
  Filled 2022-10-09: qty 100, 30d supply, fill #1
  Filled 2022-11-06: qty 100, 30d supply, fill #2
  Filled 2022-12-29: qty 100, 30d supply, fill #3
  Filled 2023-01-29: qty 100, 30d supply, fill #4
  Filled 2023-03-08: qty 100, 30d supply, fill #5
  Filled 2023-04-17: qty 100, 30d supply, fill #6
  Filled 2023-05-15: qty 100, 30d supply, fill #7

## 2022-08-30 NOTE — Patient Instructions (Signed)
MEDICATIONS:  ° °- Hydrocortisone 15 mg every morning °- Hydrocortisone 5 mg every afternoon ( Between 2-4 pm) °- Continue Florinef at half a tablet daily  ° ° ° ° °ADRENAL INSUFFICIENCY SICK DAY RULES: ° °Should you face an extreme emotional or physical stress such as trauma, surgery or acute illness, this will require extra steroid coverage so that the body can meet that stress.  ° °Without increasing the steroid dose you may experience severe weakness, headache, dizziness, nausea and vomiting and possibly a more serious deterioration in health.  °Typically the dose of steroids will only need to be increased for a couple of days if you have an illness that is transient and managed in the community.  ° °If you are unable to take/absorb an increased dose of steroids orally because of vomiting or diarrhea, you will urgently require steroid injections and should present to an Emergency Department. ° °The general advice for any serious illness is as follows: °Double the normal daily steroid dose for up to 3 days if you have a temperature of more than 37.50C (99.50F) with signs of sickness, or severe emotional or physical distress °Contact your primary care doctor and Endocrinologist if the illness worsens or it lasts for more than 3 days.  °In cases of severe illness, urgent medical assistance should be promptly sought. °If you experience vomiting/diarrhea or are unable to take steroids by mouth, please administer the Hydrocortisone injection kit and seek urgent medical help. ° ° ° ° ° °

## 2022-08-30 NOTE — Progress Notes (Signed)
Name: Denise Burns  MRN/ DOB: [id number], 1971-01-12    Age/ Sex: 52 y.o., female     PCP: [name], NP   Reason for Endocrinology Evaluation: Addison's Disease     Initial Endocrinology Clinic Visit: 08/06/2018    PATIENT IDENTIFIER: Denise Burns is a 52 y.o., female with a past medical history of Addison's Disease and premature ovarian  failure . She has followed with [facility] since 08/06/2018 for consultative assistance with management of her adrenal insufficiency.   HISTORICAL SUMMARY: The patient was first diagnosed with addison's disease in 2006. She presented with weight loss, hyperpigmentation, and  hypotension. She did not have cosyntropin stim test. She has been on [facility] and Florinef since her diagnosis.   Premature ovarian failure ~ 2017. She did have a postmenopausal bleed between 05/2018- 06/2018. Follows with [facility]    No FH of premature ovarian failure or autoimmune disease.     Testosterone and DHEA-S were not elevated  08/2020-for hair loss    PRE-DIABETES HISTORY :  A1c in 04/2021 was 6.3 %     SUBJECTIVE:    Today (08/30/2022):  Denise Burns is here for a 1 year follow up on adrenal insufficiency. She has been compliant with HC intake.    She is S/P colonoscopy 06/2022 She continues to receive intra-articular injections of the spine She follows with urology for renal stones    Denies dizziness, nausea or diarrhea  Denies leg swelling  Denies cramps  except minor feet cramps in the past 2 days     HOME ENDOCRINE MEDICATIONS: Hydrocortisone 15 mg QAM Hydrocortisone 5 mh QPM (between 2-4 pm) Fludrocortisone  0.1 mg , at half a tablet daily        HISTORY:  Past Medical History:  Past Medical History:  Diagnosis Date   Addison disease ([place])    Arthritis    Back pain    Complication of anesthesia    nausea   GERD (gastroesophageal reflux disease)    History of kidney stones    Past Surgical  History:  Past Surgical History:  Procedure Laterality Date   CHOLECYSTECTOMY  2010   COLONOSCOPY WITH PROPOFOL N/A 06/08/2022   Procedure: COLONOSCOPY WITH PROPOFOL;  Surgeon: Jonathon Bellows, MD;  Location: [facility] ENDOSCOPY;  Service: Gastroenterology;  Laterality: N/A;   CYSTOSCOPY W/ RETROGRADES Right 08/09/2021   Procedure: CYSTOSCOPY WITH RETROGRADE PYELOGRAM;  Surgeon: Hollice Espy, MD;  Location: [facility] ORS;  Service: Urology;  Laterality: Right;   CYSTOSCOPY W/ URETERAL STENT REMOVAL Right 08/09/2021   Procedure: CYSTOSCOPY WITH STENT REMOVAL;  Surgeon: Hollice Espy, MD;  Location: [facility] ORS;  Service: Urology;  Laterality: Right;   CYSTOSCOPY/URETEROSCOPY/HOLMIUM LASER/STENT PLACEMENT Right 06/28/2021   Procedure: CYSTOSCOPY/URETEROSCOPY/STENT PLACEMENT;  Surgeon: Hollice Espy, MD;  Location: [facility] ORS;  Service: Urology;  Laterality: Right;   CYSTOSCOPY/URETEROSCOPY/HOLMIUM LASER/STENT PLACEMENT Right 08/09/2021   Procedure: CYSTOSCOPY/URETEROSCOPY/HOLMIUM LASER/STENT PLACEMENT;  Surgeon: Hollice Espy, MD;  Location: [facility] ORS;  Service: Urology;  Laterality: Right;   dental work     DIAGNOSTIC LAPAROSCOPY  2004   endometriosis dx   DILATION AND CURETTAGE OF UTERUS  2010   IR NEPHROSTOMY PLACEMENT LEFT  12/14/2020   NEPHROLITHOTOMY Left 12/14/2020   Procedure: NEPHROLITHOTOMY PERCUTANEOUS;  Surgeon: Hollice Espy, MD;  Location: [facility] ORS;  Service: Urology;  Laterality: Left;   POLYPECTOMY     WISDOM TOOTH EXTRACTION     Social History:  reports that she quit smoking about 28 years ago.  Her smoking use included cigarettes. She has never used smokeless tobacco. She reports that she does not currently use alcohol. She reports that she does not use drugs. Family History:  Family History  Problem Relation Age of Onset   Arthritis Mother    Hyperlipidemia Mother    Hypertension Mother    High blood pressure Mother    Lung cancer Mother    Liver cancer Mother    Brain cancer Mother     Alcohol abuse Father    Hearing loss Father    Hyperlipidemia Father    High blood pressure Father    Colon polyps Maternal Grandmother 83   Colon cancer Neg Hx    Esophageal cancer Neg Hx    Stomach cancer Neg Hx    Rectal cancer Neg Hx      HOME MEDICATIONS: Allergies as of 08/30/2022       Reactions   Sulfacetamide Sodium (acne)    Other reaction(s): sores   Tramadol Other (See Comments)   Other reaction(s): muscle tension   Lisinopril Rash, Cough   Sulfa Antibiotics Other (See Comments)   Blisters in mouth         Medication List        Accurate as of August 30, 2022 11:28 AM. If you have any questions, ask your nurse or doctor.          Azelastine HCl 137 MCG/SPRAY Soln Place 1 spray into both nostrils 2 (two) times daily.   ergocalciferol 1.25 MG (50000 UT) capsule Commonly known as: Drisdol Take 1 capsule (50,000 Units total) by mouth once a week.   escitalopram 5 MG tablet Commonly known as: Lexapro Take 1 tablet (5 mg total) by mouth daily.   Flonase Sensimist 27.5 MCG/SPRAY nasal spray Generic drug: fluticasone Place 1 spray into the nose in the morning and at bedtime.   fludrocortisone 0.1 MG tablet Commonly known as: FLORINEF TAKE 1/2 TABLET BY MOUTH DAILY   HYDROcodone-acetaminophen 7.5-325 MG tablet Commonly known as: NORCO Take 1 tablet by mouth every 8 (eight) hours as needed for severe pain. Must last 30 days.   HYDROcodone-acetaminophen 7.5-325 MG tablet Commonly known as: NORCO Take 1 tablet by mouth every 8 (eight) hours as needed for severe pain. Must last 30 days.   HYDROcodone-acetaminophen 7.5-325 MG tablet Commonly known as: NORCO Take 1 tablet by mouth every 8 (eight) hours as needed for severe pain. Must last 30 days. Start taking on: August 31, 2022   hydrocortisone 10 MG tablet Commonly known as: CORTEF Take 1 and 1/2 tablets by mouth daily with breakfast and 1/2 tablet daily in the evening   meloxicam 15 MG  tablet Commonly known as: MOBIC Take 1 tablet (15 mg total) by mouth daily.   naloxone 4 MG/0.1ML Liqd nasal spray kit Commonly known as: NARCAN Place 1 spray into the nose as needed for up to 365 doses (for opioid-induced respiratory depresssion). In case of emergency (overdose), spray once into each nostril. If no response within 3 minutes, repeat application and call [phone].   pseudoephedrine 30 MG tablet Commonly known as: SUDAFED Take 30 mg by mouth every 12 (twelve) hours as needed for congestion.   tamsulosin 0.4 MG Caps capsule Commonly known as: FLOMAX Take 1 capsule (0.4 mg total) by mouth daily.   Urogesic-Blue 81.6 MG Tabs Take 1 tablet (81.6 mg total) by mouth 4 (four) times daily as needed.          OBJECTIVE:   PHYSICAL  EXAM: VS: LMP 12/01/2020 (Approximate)    EXAM: General: Pt appears well and is in NAD  Neck: General: Supple without adenopathy. Thyroid: Thyroid size normal.    Lungs: Clear with good BS bilat with no rales, rhonchi, or wheezes  Heart: Auscultation: RRR.  Abdomen: Normoactive bowel sounds, soft, nontender, without masses or organomegaly palpable  Extremities:  BL LE: No pretibial edema normal ROM and strength.  Mental Status: Judgment, insight: Intact Orientation: Oriented to time, place, and person Mood and affect: No depression, anxiety, or agitation     DATA REVIEWED:   Latest Reference Range & Units 06/15/22 09:43  Sodium 134 - 144 mmol/L 141  Potassium 3.5 - 5.2 mmol/L 4.1  Chloride 96 - 106 mmol/L 105  CO2 20 - 29 mmol/L 25  Glucose 70 - 99 mg/dL 96  BUN 6 - 24 mg/dL 12  Creatinine 0.57 - 1.00 mg/dL 0.82  Calcium 8.7 - 10.2 mg/dL 9.3  BUN/Creatinine Ratio 9 - 23  15  eGFR >59 mL/min/1.73 87  Alkaline Phosphatase 44 - 121 IU/L 52  Albumin 3.8 - 4.9 g/dL 4.0  Albumin/Globulin Ratio 1.2 - 2.2  1.7  AST 0 - 40 IU/L 24  ALT 0 - 32 IU/L 22  Total Protein 6.0 - 8.5 g/dL 6.4  Total Bilirubin 0.0 - 1.2 mg/dL 1.0    Latest  Reference Range & Units 06/15/22 09:43  Glucose 70 - 99 mg/dL 96  Hemoglobin A1C 4.8 - 5.6 % 5.9 (H)  Est. average glucose Bld gHb Est-mCnc mg/dL 123  TSH 0.450 - 4.500 uIU/mL 2.250  T4,Free(Direct) 0.82 - 1.77 ng/dL 0.75 (L)        ASSESSMENT / PLAN / RECOMMENDATIONS:   1. Primary Adrenal Insufficiency (Addison's Disease)     - Reviewed the sick day rule - She already wears a medical alert bracelet. - Has the 10 mg tablets of HC - BMP normal today  - We discussed switching HC to prednisone for ease of taking , but the patient will feels nauseous with prednisone - No changes today    Medications : - Hydrocortisone 15 mg QAM - Hydrocortisone 5 mh QPM (between 2-4 pm) - Continue Fludrocortisone  0.1 mg , at half a tablet daily       2. Pre-Diabetes :  - Discussed low carb diet, and exercise which as been difficult giving recent diagnosis of Mother with lung and brain cancer   F/u in 1 yr    Signed electronically by: Mack Guise, MD  [facility]  [address] [place], [place] [address] Phone: [phone] FAX: [phone]      CC: [name], NP [place] [address] Phone: [phone]  Fax: [phone]   Return to Endocrinology clinic as below: Future Appointments  Date Time Provider Tamms  08/30/2022  2:00 PM Joniece Smotherman, Melanie Crazier, MD [facility] None  09/06/2022 11:20 AM Milinda Pointer, MD [facility] None  09/13/2022  3:00 PM Hollice Espy, MD [facility] None  09/26/2022  3:00 PM Milinda Pointer, MD [facility] None

## 2022-09-01 ENCOUNTER — Ambulatory Visit: Payer: BC Managed Care – PPO | Admitting: Internal Medicine

## 2022-09-04 NOTE — Progress Notes (Unsigned)
PROVIDER NOTE: Interpretation of information contained herein should be left to medically-trained personnel. Specific patient instructions are provided elsewhere under "Patient Instructions" section of medical record. This document was created in part using STT-dictation technology, any transcriptional errors that may result from this process are unintentional.  Patient: Denise Burns Type: Established DOB: Jun 05, 1971 MRN: [id number] PCP: [name], NP   Service: Procedure DOS: 09/06/2022 Setting: Ambulatory Location: Ambulatory outpatient facility Delivery: Face-to-face Provider: [name], MD Specialty: Interventional Pain Management Specialty designation: 09 Location: Outpatient facility Ref. Prov.: Milinda Pointer, MD   Interventional Therapy     Type:  Intra-articular hip injection  #1  Laterality: Bilateral (-50)  Approach: Percutaneous posterolateral approach. Level: Lower pelvic and hip joint level.  Imaging: Fluoroscopy-guided         Anesthesia: Local anesthesia (1-2% Lidocaine) Anxiolysis: None                 Sedation: No Sedation                       DOS: 09/06/2022  Performed by: [name], MD  Purpose: Diagnostic/Therapeutic Indications: Hip pain severe enough to impact quality of life or function. Rationale (medical necessity): procedure needed and proper for the diagnosis and/or treatment of [name]'s medical symptoms and needs. 1. Chronic hip pain (Bilateral)   2. Osteoarthritis of hips (Bilateral)   3. Greater trochanteric bursitis (Bilateral)   4. Enthesopathy hip region (Left)    NAS-11 Pain score:   Pre-procedure: 6 /10   Post-procedure: 0-No pain/10      Target: Intra-articular hip joint Region: Hip joint, upper (proximal) femoral region Type of procedure: Percutaneous joint injection   Position / Prep / Materials:  Position: Prone  Prep solution: DuraPrep (Iodine Povacrylex [0.7% available iodine] and  Isopropyl Alcohol, 74% w/w) Prep Area:  Entire Posterolateral hip area. Materials:  Tray: Block tray Needle(s):  Type: Spinal  Gauge (G): 22  Length: 7-in  Qty: 2  Pre-op H&P Assessment:  Denise Burns is a 52 y.o. (year old), female patient, seen today for interventional treatment. She  has a past surgical history that includes Cholecystectomy (2010); Polypectomy; dental work; Dilation and curettage of uterus (2010); IR NEPHROSTOMY PLACEMENT LEFT (12/14/2020); Nephrolithotomy (Left, 12/14/2020); Wisdom tooth extraction; Diagnostic laparoscopy (2004); Cystoscopy/ureteroscopy/holmium laser/stent placement (Right, 06/28/2021); Cystoscopy w/ ureteral stent removal (Right, 08/09/2021); Cystoscopy w/ retrogrades (Right, 08/09/2021); Cystoscopy/ureteroscopy/holmium laser/stent placement (Right, 08/09/2021); and Colonoscopy with propofol (N/A, 06/08/2022). [name] has a current medication list which includes the following prescription(s): azelastine, ergocalciferol, escitalopram, fludrocortisone, flonase sensimist, hydrocodone-acetaminophen, hydrocortisone, meloxicam, urogesic-blue, naloxone, pseudoephedrine, hydrocodone-acetaminophen, and hydrocodone-acetaminophen, and the following Facility-Administered Medications: ropivacaine (pf) 2 mg/ml (0.2%). Her primarily concern today is the Hip Pain  Initial Vital Signs:  Pulse/HCG Rate: 93  Temp: 97.9 F (36.6 C) Resp: 18 BP: 134/89 SpO2: 96 %  BMI: Estimated body mass index is 30.9 kg/m as calculated from the following:   Height as of this encounter: '5\' 4"'$  (1.626 m).   Weight as of this encounter: 180 lb (81.6 kg).  Risk Assessment: Allergies: Reviewed. She is allergic to sulfacetamide sodium (acne), tramadol, lisinopril, and sulfa antibiotics.  Allergy Precautions: None required Coagulopathies: Reviewed. None identified.  Blood-thinner therapy: None at this time Active Infection(s): Reviewed. None identified. [name] is afebrile  Site  Confirmation: [name] was asked to confirm the procedure and laterality before marking the site Procedure checklist: Completed Consent: Before the procedure and under the influence of  no sedative(s), amnesic(s), or anxiolytics, the patient was informed of the treatment options, risks and possible complications. To fulfill our ethical and legal obligations, as recommended by the American Medical Association's Code of Ethics, I have informed the patient of my clinical impression; the nature and purpose of the treatment or procedure; the risks, benefits, and possible complications of the intervention; the alternatives, including doing nothing; the risk(s) and benefit(s) of the alternative treatment(s) or procedure(s); and the risk(s) and benefit(s) of doing nothing. The patient was provided information about the general risks and possible complications associated with the procedure. These may include, but are not limited to: failure to achieve desired goals, infection, bleeding, organ or nerve damage, allergic reactions, paralysis, and death. In addition, the patient was informed of those risks and complications associated to the procedure, such as failure to decrease pain; infection; bleeding; organ or nerve damage with subsequent damage to sensory, motor, and/or autonomic systems, resulting in permanent pain, numbness, and/or weakness of one or several areas of the body; allergic reactions; (i.e.: anaphylactic reaction); and/or death. Furthermore, the patient was informed of those risks and complications associated with the medications. These include, but are not limited to: allergic reactions (i.e.: anaphylactic or anaphylactoid reaction(s)); adrenal axis suppression; blood sugar elevation that in diabetics may result in ketoacidosis or comma; water retention that in patients with history of congestive heart failure may result in shortness of breath, pulmonary edema, and decompensation with resultant heart  failure; weight gain; swelling or edema; medication-induced neural toxicity; particulate matter embolism and blood vessel occlusion with resultant organ, and/or nervous system infarction; and/or aseptic necrosis of one or more joints. Finally, the patient was informed that Medicine is not an exact science; therefore, there is also the possibility of unforeseen or unpredictable risks and/or possible complications that may result in a catastrophic outcome. The patient indicated having understood very clearly. We have given the patient no guarantees and we have made no promises. Enough time was given to the patient to ask questions, all of which were answered to the patient's satisfaction. Ms. Guier has indicated that she wanted to continue with the procedure. Attestation: I, the ordering provider, attest that I have discussed with the patient the benefits, risks, side-effects, alternatives, likelihood of achieving goals, and potential problems during recovery for the procedure that I have provided informed consent. Date  Time: 09/06/2022 11:02 AM  Pre-Procedure Preparation:  Monitoring: As per clinic protocol. Respiration, ETCO2, SpO2, BP, heart rate and rhythm monitor placed and checked for adequate function Safety Precautions: Patient was assessed for positional comfort and pressure points before starting the procedure. Time-out: I initiated and conducted the "Time-out" before starting the procedure, as per protocol. The patient was asked to participate by confirming the accuracy of the "Time Out" information. Verification of the correct person, site, and procedure were performed and confirmed by me, the nursing staff, and the patient. "Time-out" conducted as per Joint Commission's Universal Protocol (UP.01.01.01). Time: 1120  Description/Narrative of Procedure:          Rationale (medical necessity): procedure needed and proper for the diagnosis and/or treatment of the patient's medical symptoms and  needs. Procedural Technique Safety Precautions: Aspiration looking for blood return was conducted prior to all injections. At no point did we inject any substances, as a needle was being advanced. No attempts were made at seeking any paresthesias. Safe injection practices and needle disposal techniques used. Medications properly checked for expiration dates. SDV (single dose vial) medications used. Description of the Procedure:  Protocol guidelines were followed. The patient was assisted into a comfortable position. The target area was identified and the area prepped in the usual manner. Skin & deeper tissues infiltrated with local anesthetic. Appropriate amount of time allowed to pass for local anesthetics to take effect. The procedure needles were then advanced to the target area. Proper needle placement secured. Negative aspiration confirmed. Solution injected in intermittent fashion, asking for systemic symptoms every 0.5cc of injectate. The needles were then removed and the area cleansed, making sure to leave some of the prepping solution back to take advantage of its long term bactericidal properties.  Technical description of procedure:  Skin & deeper tissues infiltrated with local anesthetic. Appropriate amount of time allowed to pass for local anesthetics to take effect. The procedure needles were then advanced to the target area. Proper needle placement secured. Negative aspiration confirmed. Solution injected in intermittent fashion, asking for systemic symptoms every 0.5cc of injectate. The needles were then removed and the area cleansed, making sure to leave some of the prepping solution back to take advantage of its long term bactericidal properties.       Vitals:   09/06/22 1120 09/06/22 1125 09/06/22 1130 09/06/22 1135  BP: (!) 142/98 (!) 151/102 (!) 143/99 (!) 142/95  Pulse: 76 78 83 86  Resp: '17 16 16 16  '$ Temp:      TempSrc:      SpO2: 98% 98% 96% 97%  Weight:      Height:          Start Time: 1120 hrs. End Time: 1131 hrs.  Imaging Guidance (Non-Spinal):          Type of Imaging Technique: Fluoroscopy Guidance (Non-Spinal) Indication(s): Assistance in needle guidance and placement for procedures requiring needle placement in or near specific anatomical locations not easily accessible without such assistance. Exposure Time: Please see nurses notes. Contrast: Before injecting any contrast, we confirmed that the patient did not have an allergy to iodine, shellfish, or radiological contrast. Once satisfactory needle placement was completed at the desired level, radiological contrast was injected. Contrast injected under live fluoroscopy. No contrast complications. See chart for type and volume of contrast used. Fluoroscopic Guidance: I was personally present during the use of fluoroscopy. "Tunnel Vision Technique" used to obtain the best possible view of the target area. Parallax error corrected before commencing the procedure. "Direction-depth-direction" technique used to introduce the needle under continuous pulsed fluoroscopy. Once target was reached, antero-posterior, oblique, and lateral fluoroscopic projection used confirm needle placement in all planes. Images permanently stored in EMR. Interpretation: I personally interpreted the imaging intraoperatively. Adequate needle placement confirmed in multiple planes. Appropriate spread of contrast into desired area was observed. No evidence of afferent or efferent intravascular uptake. Permanent images saved into the patient's record.  Post-operative Assessment:  Post-procedure Vital Signs:  Pulse/HCG Rate: 86  Temp: 97.9 F (36.6 C) Resp: 16 BP: (!) 142/95 SpO2: 97 %  EBL: None  Complications: No immediate post-treatment complications observed by team, or reported by patient.  Note: The patient tolerated the entire procedure well. A repeat set of vitals were taken after the procedure and the patient was kept under  observation following institutional policy, for this type of procedure. Post-procedural neurological assessment was performed, showing return to baseline, prior to discharge. The patient was provided with post-procedure discharge instructions, including a section on how to identify potential problems. Should any problems arise concerning this procedure, the patient was given instructions to immediately contact us, at any time,  without hesitation. In any case, we plan to contact the patient by telephone for a follow-up status report regarding this interventional procedure.  Comments:  No additional relevant information.  Plan of Care  Orders:  Orders Placed This Encounter  Procedures   HIP INJECTION    Scheduling Instructions:     Side: Bilateral     Sedation: Patient's choice.     Timeframe: Today   DG PA[facility] C-ARM 1-60 MIN NO REPORT    Intraoperative interpretation by procedural physician at [facility].    Standing Status:   Standing    Number of Occurrences:   1    Order Specific Question:   Reason for exam:    Answer:   Assistance in needle guidance and placement for procedures requiring needle placement in or near specific anatomical locations not easily accessible without such assistance.   Informed Consent Details: Physician/Practitioner Attestation; Transcribe to consent form and obtain patient signature    Nursing Order: Transcribe to consent form and obtain patient signature. Note: Always confirm laterality of pain with [name], before procedure.    Order Specific Question:   Physician/Practitioner attestation of informed consent for procedure/surgical case    Answer:   I, the physician/practitioner, attest that I have discussed with the patient the benefits, risks, side effects, alternatives, likelihood of achieving goals and potential problems during recovery for the procedure that I have provided informed consent.    Order Specific Question:   Procedure     Answer:   Hip injection    Order Specific Question:   Physician/Practitioner performing the procedure    Answer:   Gena Laski A. Dossie Arbour, MD    Order Specific Question:   Indication/Reason    Answer:   Hip Joint Pain (Arthralgia)   Provide equipment / supplies at bedside    Procedure tray: "Block Tray" (Disposable  single use) Skin infiltration needle: Regular 1.5-in, 25-G, (x1) Block Needle type: Spinal Amount/quantity: 2 Size: Long (7-inch) Gauge: 22G    Standing Status:   Standing    Number of Occurrences:   1    Order Specific Question:   Specify    Answer:   Block Tray   Chronic Opioid Analgesic:  Hydrocodone/APAP 7.5/325 1 tablet p.o. 3 times daily (22.5 mg/day of hydrocodone)  MME/day: 22.5 mg/day.   Medications ordered for procedure: Meds ordered this encounter  Medications   iohexol (OMNIPAQUE) 180 MG/ML injection 10 mL    Must be Myelogram-compatible. If not available, you may substitute with a water-soluble, non-ionic, hypoallergenic, myelogram-compatible radiological contrast medium.   lidocaine (XYLOCAINE) 2 % (with pres) injection 400 mg   pentafluoroprop-tetrafluoroeth (GEBAUERS) aerosol   DISCONTD: lactated ringers infusion   DISCONTD: midazolam (VERSED) injection 0.5-2 mg    Make sure Flumazenil is available in the pyxis when using this medication. If oversedation occurs, administer 0.2 mg IV over 15 sec. If after 45 sec no response, administer 0.2 mg again over 1 min; may repeat at 1 min intervals; not to exceed 4 doses (1 mg)   ropivacaine (PF) 2 mg/mL (0.2%) (NAROPIN) injection 8 mL   ropivacaine (PF) 2 mg/mL (0.2%) (NAROPIN) injection 18 mL   methylPREDNISolone acetate (DEPO-MEDROL) injection 160 mg   Medications administered: We administered iohexol, lidocaine, pentafluoroprop-tetrafluoroeth, ropivacaine (PF) 2 mg/mL (0.2%), and methylPREDNISolone acetate.  See the medical record for exact dosing, route, and time of administration.  Follow-up plan:    Return in about 2 weeks (around 09/20/2022) for Proc-day (T,Th), (Face2F), (PPE).  Interventional Therapies  Risk  Complexity Considerations:   WNL   Planned  Pending:   Therapeutic bilateral IA hip injection (posterolateral approach) R3L2    Under consideration:   Therapeutic bilateral IA hip injection (posterolateral approach) R3L2    Completed:   Therapeutic bilateral lumbar facet MBB x3 (09/08/2020) (4-0) (100/100/60/60)  Therapeutic right lumbar facet RFA x2 (09/09/2021) (4-2) (100/100/90/>75)  Therapeutic left lumbar facet RFA x2 (10/07/2021) (4-0) (100/100/90/>75)  Therapeutic right IA hip inj. x2 (08/20/2020) (5-1) (100/100/80/80)  Therapeutic left IA hip inj. x1 (08/20/2020) (5-1) (100/100/80/80)  Therapeutic bilateral trochanteric bursa inj. x3 (04/07/2022)  (100/100/90/90)    Therapeutic  Palliative (PRN) options:   Palliative lumbar facet MBB   Palliative lumbar facet RFA  Palliative trochanteric bursa inj.    Pharmacotherapy:  Nonopioids transferred 05/11/2020: Baclofen and Mobic Recommendations:   None at this time.       Recent Visits Date Type Provider Dept  08/24/22 Office Visit Milinda Pointer, MD [facility]  06/29/22 Office Visit Milinda Pointer, MD [facility]  Showing recent visits within past 90 days and meeting all other requirements Today's Visits Date Type Provider Dept  09/06/22 Procedure visit Milinda Pointer, MD [facility]  Showing today's visits and meeting all other requirements Future Appointments Date Type Provider Dept  09/22/22 Appointment Milinda Pointer, MD [facility]  09/26/22 Appointment Milinda Pointer, MD [facility]  Showing future appointments within next 90 days and meeting all other requirements  Disposition: Discharge home  Discharge (Date  Time): 09/06/2022; 1136 hrs.   Primary Care Physician: [name], NP Location: [facility] Note by: [name], MD Date: 09/06/2022; Time: 1:27 PM  Disclaimer:  Medicine is not an Chief Strategy Officer. The only guarantee in medicine is that nothing is guaranteed. It is important to note that the decision to proceed with this intervention was based on the information collected from the patient. The Data and conclusions were drawn from the patient's questionnaire, the interview, and the physical examination. Because the information was provided in large part by the patient, it cannot be guaranteed that it has not been purposely or unconsciously manipulated. Every effort has been made to obtain as much relevant data as possible for this evaluation. It is important to note that the conclusions that lead to this procedure are derived in large part from the available data. Always take into account that the treatment will also be dependent on availability of resources and existing treatment guidelines, considered by other Pain Management Practitioners as being common knowledge and practice, at the time of the intervention. For Medico-Legal purposes, it is also important to point out that variation in procedural techniques and pharmacological choices are the acceptable norm. The indications, contraindications, technique, and results of the above procedure should only be interpreted and judged by a Board-Certified Interventional Pain Specialist with extensive familiarity and expertise in the same exact procedure and technique.

## 2022-09-06 ENCOUNTER — Ambulatory Visit: Payer: BC Managed Care – PPO | Attending: Pain Medicine | Admitting: Pain Medicine

## 2022-09-06 ENCOUNTER — Encounter: Payer: Self-pay | Admitting: Pain Medicine

## 2022-09-06 ENCOUNTER — Ambulatory Visit
Admission: RE | Admit: 2022-09-06 | Discharge: 2022-09-06 | Disposition: A | Discharge status: Home / Self Care | Payer: BC Managed Care – PPO | Source: Ambulatory Visit | Attending: Pain Medicine | Admitting: Pain Medicine

## 2022-09-06 VITALS — BP 142/95 | HR 86 | Temp 97.9°F | Resp 16 | Ht 64.0 in | Wt 180.0 lb

## 2022-09-06 DIAGNOSIS — M160 Bilateral primary osteoarthritis of hip: Secondary | ICD-10-CM | POA: Insufficient documentation

## 2022-09-06 DIAGNOSIS — M76892 Other specified enthesopathies of left lower limb, excluding foot: Secondary | ICD-10-CM | POA: Diagnosis not present

## 2022-09-06 DIAGNOSIS — M25551 Pain in right hip: Secondary | ICD-10-CM | POA: Diagnosis not present

## 2022-09-06 DIAGNOSIS — M7062 Trochanteric bursitis, left hip: Secondary | ICD-10-CM | POA: Diagnosis not present

## 2022-09-06 DIAGNOSIS — G8929 Other chronic pain: Secondary | ICD-10-CM | POA: Diagnosis not present

## 2022-09-06 DIAGNOSIS — M25552 Pain in left hip: Secondary | ICD-10-CM | POA: Diagnosis not present

## 2022-09-06 DIAGNOSIS — M7061 Trochanteric bursitis, right hip: Secondary | ICD-10-CM

## 2022-09-06 MED ORDER — METHYLPREDNISOLONE ACETATE 80 MG/ML IJ SUSP
INTRAMUSCULAR | Status: AC
  Filled 2022-09-06: qty 2

## 2022-09-06 MED ORDER — PENTAFLUOROPROP-TETRAFLUOROETH EX AERO
INHALATION_SPRAY | Freq: Once | CUTANEOUS | Status: AC
  Administered 2022-09-06: 30 via TOPICAL

## 2022-09-06 MED ORDER — LACTATED RINGERS IV SOLN: Freq: Once | INTRAVENOUS | Status: DC

## 2022-09-06 MED ORDER — IOHEXOL 180 MG/ML  SOLN
10.0000 mL | Freq: Once | INTRAMUSCULAR | Status: AC
  Administered 2022-09-06: 10 mL via INTRA_ARTICULAR
  Filled 2022-09-06: qty 20

## 2022-09-06 MED ORDER — LIDOCAINE HCL 2 % IJ SOLN
INTRAMUSCULAR | Status: AC
  Filled 2022-09-06: qty 20

## 2022-09-06 MED ORDER — ROPIVACAINE HCL 2 MG/ML IJ SOLN: 8.0000 mL | Freq: Once | INTRAMUSCULAR | Status: DC

## 2022-09-06 MED ORDER — ROPIVACAINE HCL 2 MG/ML IJ SOLN
INTRAMUSCULAR | Status: AC
  Filled 2022-09-06: qty 20

## 2022-09-06 MED ORDER — LIDOCAINE HCL 2 % IJ SOLN
20.0000 mL | Freq: Once | INTRAMUSCULAR | Status: AC
  Administered 2022-09-06: 400 mg

## 2022-09-06 MED ORDER — ROPIVACAINE HCL 2 MG/ML IJ SOLN
18.0000 mL | Freq: Once | INTRAMUSCULAR | Status: AC
  Administered 2022-09-06: 18 mL via INTRA_ARTICULAR

## 2022-09-06 MED ORDER — MIDAZOLAM HCL 2 MG/2ML IJ SOLN: 0.5000 mg | Freq: Once | INTRAMUSCULAR | Status: DC

## 2022-09-06 MED ORDER — METHYLPREDNISOLONE ACETATE 80 MG/ML IJ SUSP
160.0000 mg | Freq: Once | INTRAMUSCULAR | Status: AC
  Administered 2022-09-06: 160 mg via INTRA_ARTICULAR

## 2022-09-06 NOTE — Progress Notes (Signed)
Safety precautions to be maintained throughout the outpatient stay will include: orient to surroundings, keep bed in low position, maintain call bell within reach at all times, provide assistance with transfer out of bed and ambulation.  

## 2022-09-06 NOTE — Patient Instructions (Signed)

## 2022-09-07 ENCOUNTER — Telehealth: Payer: Self-pay

## 2022-09-07 NOTE — Telephone Encounter (Signed)
Post procedure follow up.  LM 

## 2022-09-08 ENCOUNTER — Other Ambulatory Visit: Payer: Self-pay

## 2022-09-11 ENCOUNTER — Other Ambulatory Visit: Payer: Self-pay

## 2022-09-12 ENCOUNTER — Other Ambulatory Visit: Payer: Self-pay

## 2022-09-12 DIAGNOSIS — N200 Calculus of kidney: Secondary | ICD-10-CM

## 2022-09-13 ENCOUNTER — Other Ambulatory Visit: Payer: Self-pay

## 2022-09-13 ENCOUNTER — Ambulatory Visit: Payer: BC Managed Care – PPO | Admitting: Urology

## 2022-09-13 ENCOUNTER — Encounter: Payer: Self-pay | Admitting: Urology

## 2022-09-13 ENCOUNTER — Ambulatory Visit
Admission: RE | Admit: 2022-09-13 | Discharge: 2022-09-13 | Disposition: A | Discharge status: Home / Self Care | Payer: BC Managed Care – PPO | Source: Ambulatory Visit | Attending: Urology | Admitting: Urology

## 2022-09-13 ENCOUNTER — Ambulatory Visit
Admission: RE | Admit: 2022-09-13 | Discharge: 2022-09-13 | Disposition: A | Discharge status: Home / Self Care | Payer: BC Managed Care – PPO | Attending: Urology | Admitting: Urology

## 2022-09-13 ENCOUNTER — Other Ambulatory Visit: Payer: Self-pay | Admitting: Nurse Practitioner

## 2022-09-13 VITALS — BP 123/81 | HR 79 | Ht 64.0 in | Wt 175.0 lb

## 2022-09-13 DIAGNOSIS — F411 Generalized anxiety disorder: Secondary | ICD-10-CM

## 2022-09-13 DIAGNOSIS — M47817 Spondylosis without myelopathy or radiculopathy, lumbosacral region: Secondary | ICD-10-CM | POA: Diagnosis not present

## 2022-09-13 DIAGNOSIS — N200 Calculus of kidney: Secondary | ICD-10-CM | POA: Insufficient documentation

## 2022-09-13 DIAGNOSIS — N12 Tubulo-interstitial nephritis, not specified as acute or chronic: Secondary | ICD-10-CM | POA: Diagnosis not present

## 2022-09-13 DIAGNOSIS — A419 Sepsis, unspecified organism: Secondary | ICD-10-CM | POA: Diagnosis not present

## 2022-09-13 DIAGNOSIS — Z87442 Personal history of urinary calculi: Secondary | ICD-10-CM

## 2022-09-13 DIAGNOSIS — Z9049 Acquired absence of other specified parts of digestive tract: Secondary | ICD-10-CM | POA: Diagnosis not present

## 2022-09-13 MED ORDER — AZELASTINE HCL 0.1 % NA SOLN
1.0000 | Freq: Two times a day (BID) | NASAL | 1 refills | Status: DC
  Filled 2022-09-13: qty 30, 30d supply, fill #0
  Filled 2022-10-09: qty 30, 30d supply, fill #1

## 2022-09-13 MED ORDER — ESCITALOPRAM OXALATE 5 MG PO TABS
5.0000 mg | ORAL_TABLET | Freq: Every day | ORAL | 1 refills | Status: DC
  Filled 2022-09-13: qty 30, 30d supply, fill #0
  Filled 2022-10-09: qty 30, 30d supply, fill #1

## 2022-09-13 NOTE — Progress Notes (Signed)
I,Amy L Pierron,acting as a scribe for Hollice Espy, MD.,have documented all relevant documentation on the behalf of Hollice Espy, MD,as directed by  Hollice Espy, MD while in the presence of Hollice Espy, MD.   09/13/2022 4:38 PM   Denise Burns 02-22-1971 [id number]  Referring provider: [name], NP [place],  [place] [address]  Chief Complaint  Patient presents with   Nephrolithiasis    HPI: 52 year-old female with a personal history of nephrolithiasis who presents today for follow-up. Notably she was having flank pain concerning her stones and underwent a CT stone protocol in October of 2023. At that time she had approximately a three millimeter right lower pulse stone, otherwise no significant stone burden and no obstructing stones.   She had a KUB today for a comparison, which I personally reviewed; awaiting final radiologic interpretation. No significant stone burden was identified, including non-visualization of her right lower pulse stone, although there's some overlying bowel shadow which may be obscuring this.   In the past she's undergone multiple procedures, including uncomplicated left PCNL on 12/15/2020 and right ureteroscopy in 2020.  Today she reports she is doing well overall and denies any kidney pain. She has been drinking water and green tea.    PMH: Past Medical History:  Diagnosis Date   Addison disease ([place])    Arthritis    Back pain    Complication of anesthesia    nausea   GERD (gastroesophageal reflux disease)    History of kidney stones     Surgical History: Past Surgical History:  Procedure Laterality Date   CHOLECYSTECTOMY  2010   COLONOSCOPY WITH PROPOFOL N/A 06/08/2022   Procedure: COLONOSCOPY WITH PROPOFOL;  Surgeon: Jonathon Bellows, MD;  Location: [facility];  Service: Gastroenterology;  Laterality: N/A;   CYSTOSCOPY W/ RETROGRADES Right 08/09/2021   Procedure: CYSTOSCOPY WITH RETROGRADE PYELOGRAM;   Surgeon: Hollice Espy, MD;  Location: [facility];  Service: Urology;  Laterality: Right;   CYSTOSCOPY W/ URETERAL STENT REMOVAL Right 08/09/2021   Procedure: CYSTOSCOPY WITH STENT REMOVAL;  Surgeon: Hollice Espy, MD;  Location: [facility];  Service: Urology;  Laterality: Right;   CYSTOSCOPY/URETEROSCOPY/HOLMIUM LASER/STENT PLACEMENT Right 06/28/2021   Procedure: CYSTOSCOPY/URETEROSCOPY/STENT PLACEMENT;  Surgeon: Hollice Espy, MD;  Location: [facility];  Service: Urology;  Laterality: Right;   CYSTOSCOPY/URETEROSCOPY/HOLMIUM LASER/STENT PLACEMENT Right 08/09/2021   Procedure: CYSTOSCOPY/URETEROSCOPY/HOLMIUM LASER/STENT PLACEMENT;  Surgeon: Hollice Espy, MD;  Location: [facility];  Service: Urology;  Laterality: Right;   dental work     DIAGNOSTIC LAPAROSCOPY  2004   endometriosis dx   DILATION AND CURETTAGE OF UTERUS  2010   IR NEPHROSTOMY PLACEMENT LEFT  12/14/2020   NEPHROLITHOTOMY Left 12/14/2020   Procedure: NEPHROLITHOTOMY PERCUTANEOUS;  Surgeon: Hollice Espy, MD;  Location: [facility];  Service: Urology;  Laterality: Left;   POLYPECTOMY     WISDOM TOOTH EXTRACTION      Home Medications:  Allergies as of 09/13/2022       Reactions   Sulfacetamide Sodium (acne)    Other reaction(s): sores   Tramadol Other (See Comments)   Other reaction(s): muscle tension   Lisinopril Rash, Cough   Sulfa Antibiotics Other (See Comments)   Blisters in mouth         Medication List        Accurate as of September 13, 2022  4:38 PM. If you have any questions, ask your nurse or doctor.  azelastine 0.1 % nasal spray Commonly known as: ASTELIN Place 1 spray into both nostrils 2 (two) times daily.   ergocalciferol 1.25 MG (50000 UT) capsule Commonly known as: Drisdol Take 1 capsule (50,000 Units total) by mouth once a week.   escitalopram 5 MG tablet Commonly known as: Lexapro Take 1 tablet (5 mg total) by mouth daily.   Flonase Sensimist 27.5 MCG/SPRAY nasal spray Generic  drug: fluticasone Place 1 spray into the nose in the morning and at bedtime.   fludrocortisone 0.1 MG tablet Commonly known as: FLORINEF Take 0.5 tablets (0.05 mg total) by mouth daily.   HYDROcodone-acetaminophen 7.5-325 MG tablet Commonly known as: NORCO Take 1 tablet by mouth every 8 (eight) hours as needed for severe pain. Must last 30 days.   HYDROcodone-acetaminophen 7.5-325 MG tablet Commonly known as: NORCO Take 1 tablet by mouth every 8 (eight) hours as needed for severe pain. Must last 30 days.   HYDROcodone-acetaminophen 7.5-325 MG tablet Commonly known as: NORCO Take 1 tablet by mouth every 8 (eight) hours as needed for severe pain. Must last 30 days.   hydrocortisone 10 MG tablet Commonly known as: CORTEF Take 1.5 tablets (15 mg total) by mouth with breakfast and 1/2 tablet in the afternoon. (May double up during sick days)   meloxicam 15 MG tablet Commonly known as: MOBIC Take 1 tablet (15 mg total) by mouth daily.   naloxone 4 MG/0.1ML Liqd nasal spray kit Commonly known as: NARCAN Place 1 spray into the nose as needed for up to 365 doses (for opioid-induced respiratory depresssion). In case of emergency (overdose), spray once into each nostril. If no response within 3 minutes, repeat application and call [id number].   pseudoephedrine 30 MG tablet Commonly known as: SUDAFED Take 30 mg by mouth every 12 (twelve) hours as needed for congestion.   Urogesic-Blue 81.6 MG Tabs Take 1 tablet (81.6 mg total) by mouth 4 (four) times daily as needed.        Allergies:  Allergies  Allergen Reactions   Sulfacetamide Sodium (Acne)     Other reaction(s): sores   Tramadol Other (See Comments)    Other reaction(s): muscle tension   Lisinopril Rash and Cough   Sulfa Antibiotics Other (See Comments)    Blisters in mouth     Family History: Family History  Problem Relation Age of Onset   Arthritis Mother    Hyperlipidemia Mother    Hypertension Mother    High blood  pressure Mother    Lung cancer Mother    Liver cancer Mother    Brain cancer Mother    Alcohol abuse Father    Hearing loss Father    Hyperlipidemia Father    High blood pressure Father    Colon polyps Maternal Grandmother 14   Colon cancer Neg Hx    Esophageal cancer Neg Hx    Stomach cancer Neg Hx    Rectal cancer Neg Hx     Social History:  reports that she quit smoking about 28 years ago. Her smoking use included cigarettes. She has never used smokeless tobacco. She reports that she does not currently use alcohol. She reports that she does not use drugs.   Physical Exam: BP 123/81   Pulse 79   Ht 5' 4"$  (1.626 m)   Wt 175 lb (79.4 kg)   LMP 12/01/2020 (Approximate)   BMI 30.04 kg/m   Constitutional:  Alert and oriented, No acute distress. HEENT: [place] AT, moist mucus membranes.  Trachea midline,  no masses. Neurologic: Grossly intact, no focal deficits, moving all 4 extremities. Psychiatric: Normal mood and affect.   Assessment & Plan:    Right Nephrolithiasis  - We discussed general stone prevention techniques including drinking plenty water with goal of producing 2.5 L urine daily, increased citric acid intake, avoidance of high oxalate containing foods, and decreased salt intake.  Information about dietary recommendations given today.    - She is doing well and will call if she needs [place].   Return in about 2 years (around 09/13/2024) for KUB.   [facility] [address] [place], [place] [address] [phone]

## 2022-09-14 ENCOUNTER — Encounter: Payer: Self-pay | Admitting: Urology

## 2022-09-15 IMAGING — CR DG HIP (WITH OR WITHOUT PELVIS) 2-3V*L*
1 series · 3 of 3 positions shown · non-contrast
Comparison: None.

CLINICAL DATA: Left hip pain.

EXAM:
DG HIP (WITH OR WITHOUT PELVIS) 2-3V LEFT

[Series 1: dg hip unilat w or w/o pelvis 2-3 views  · non-contrast · 0.14mm/px · 3 of 3 slices shown]
[im 1/3]
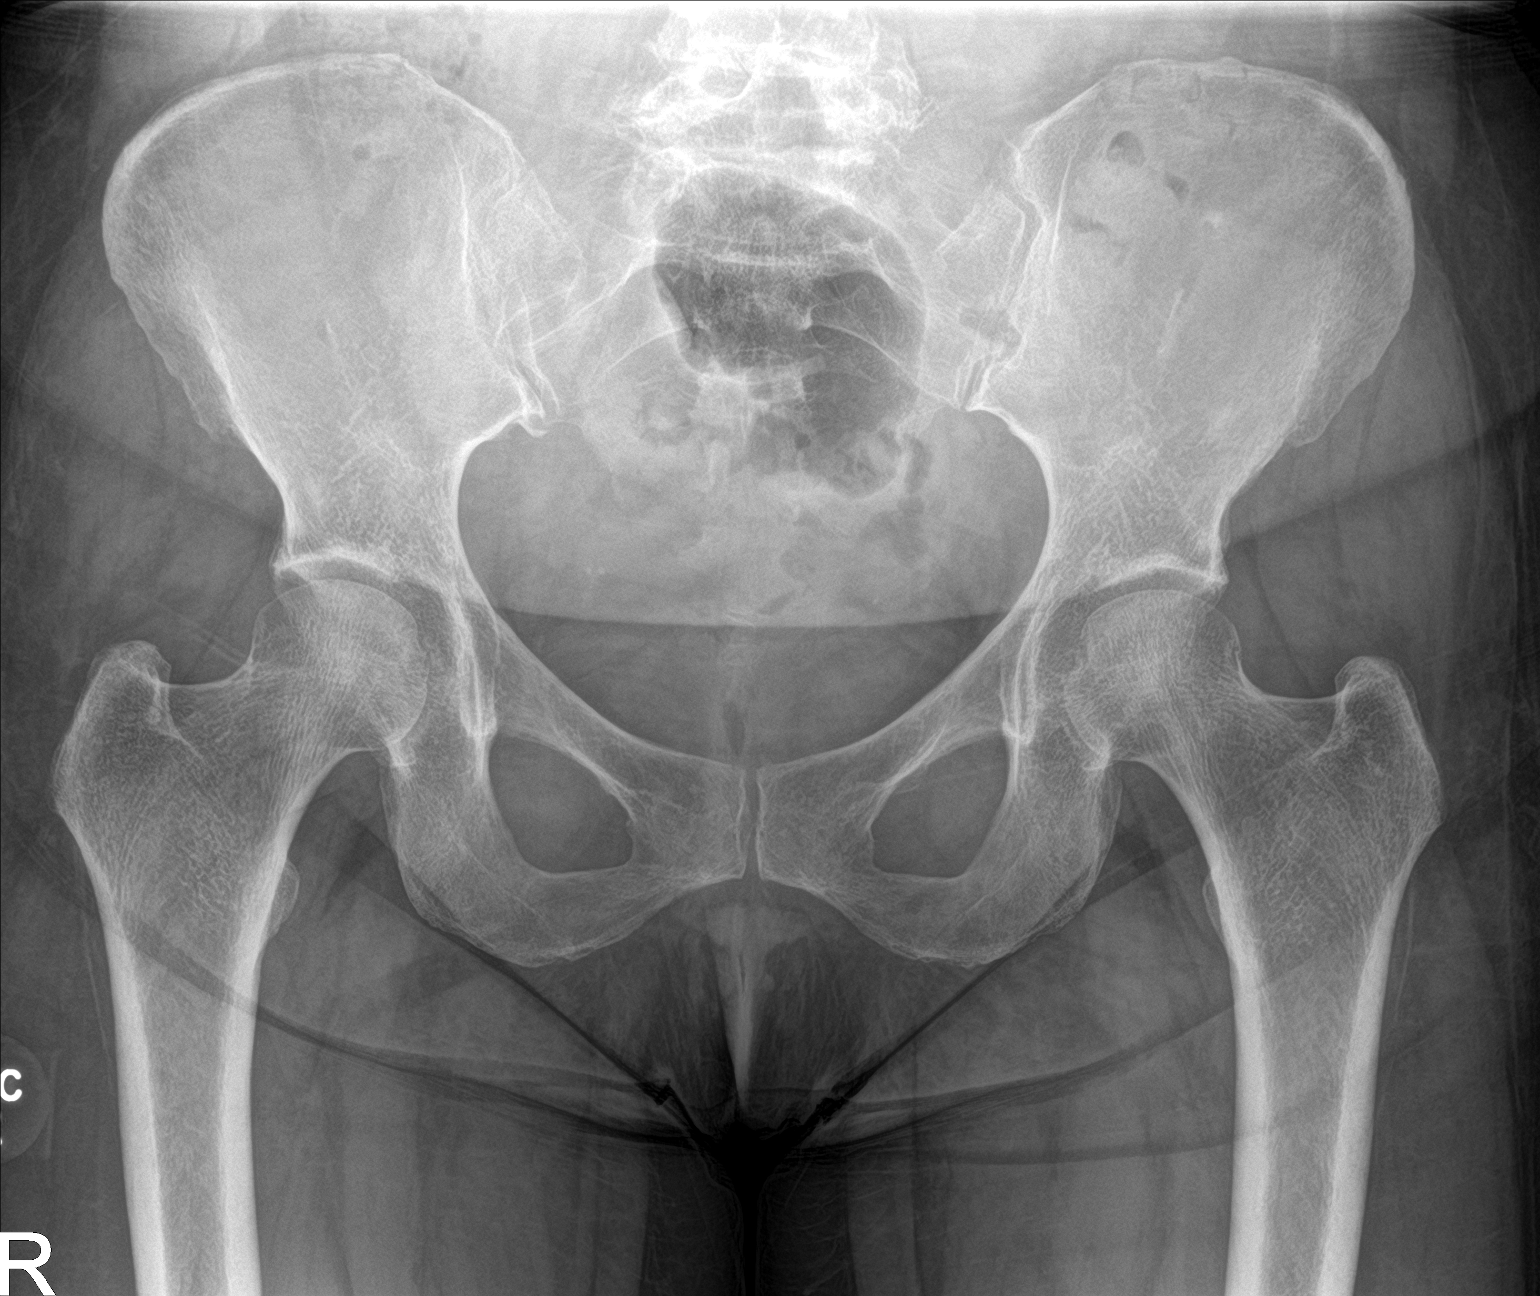
[im 2/3]
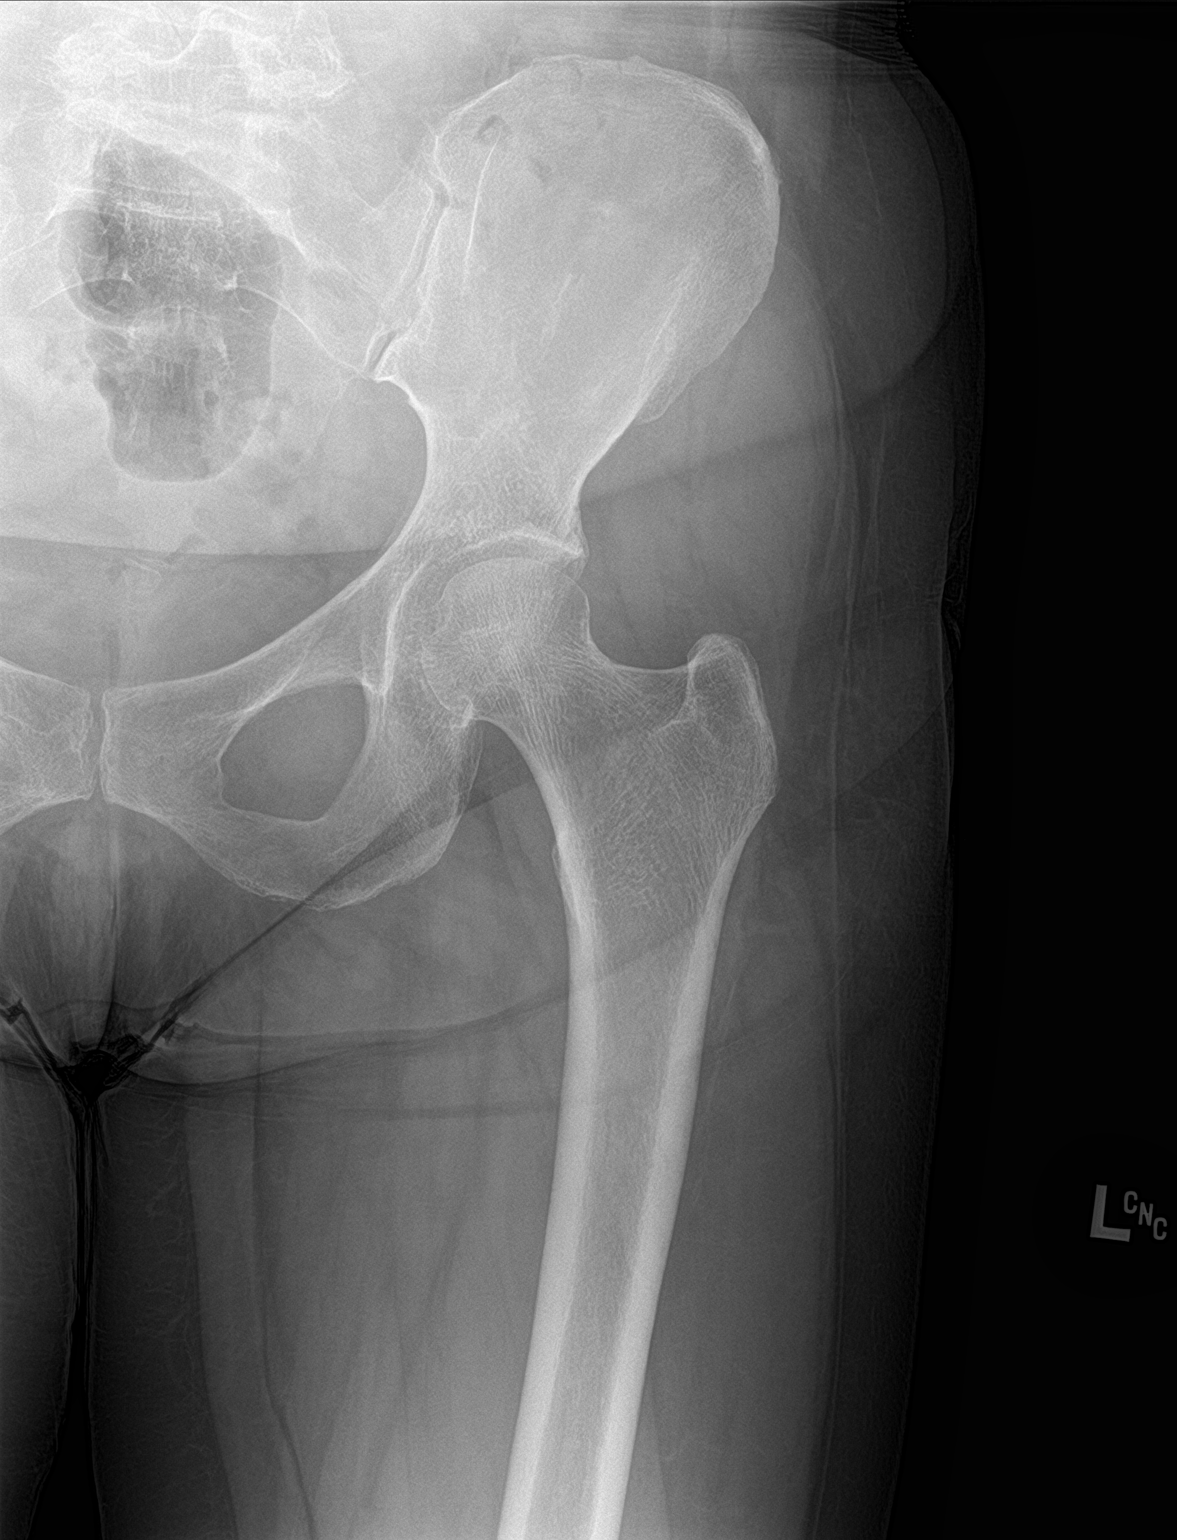
[im 3/3]
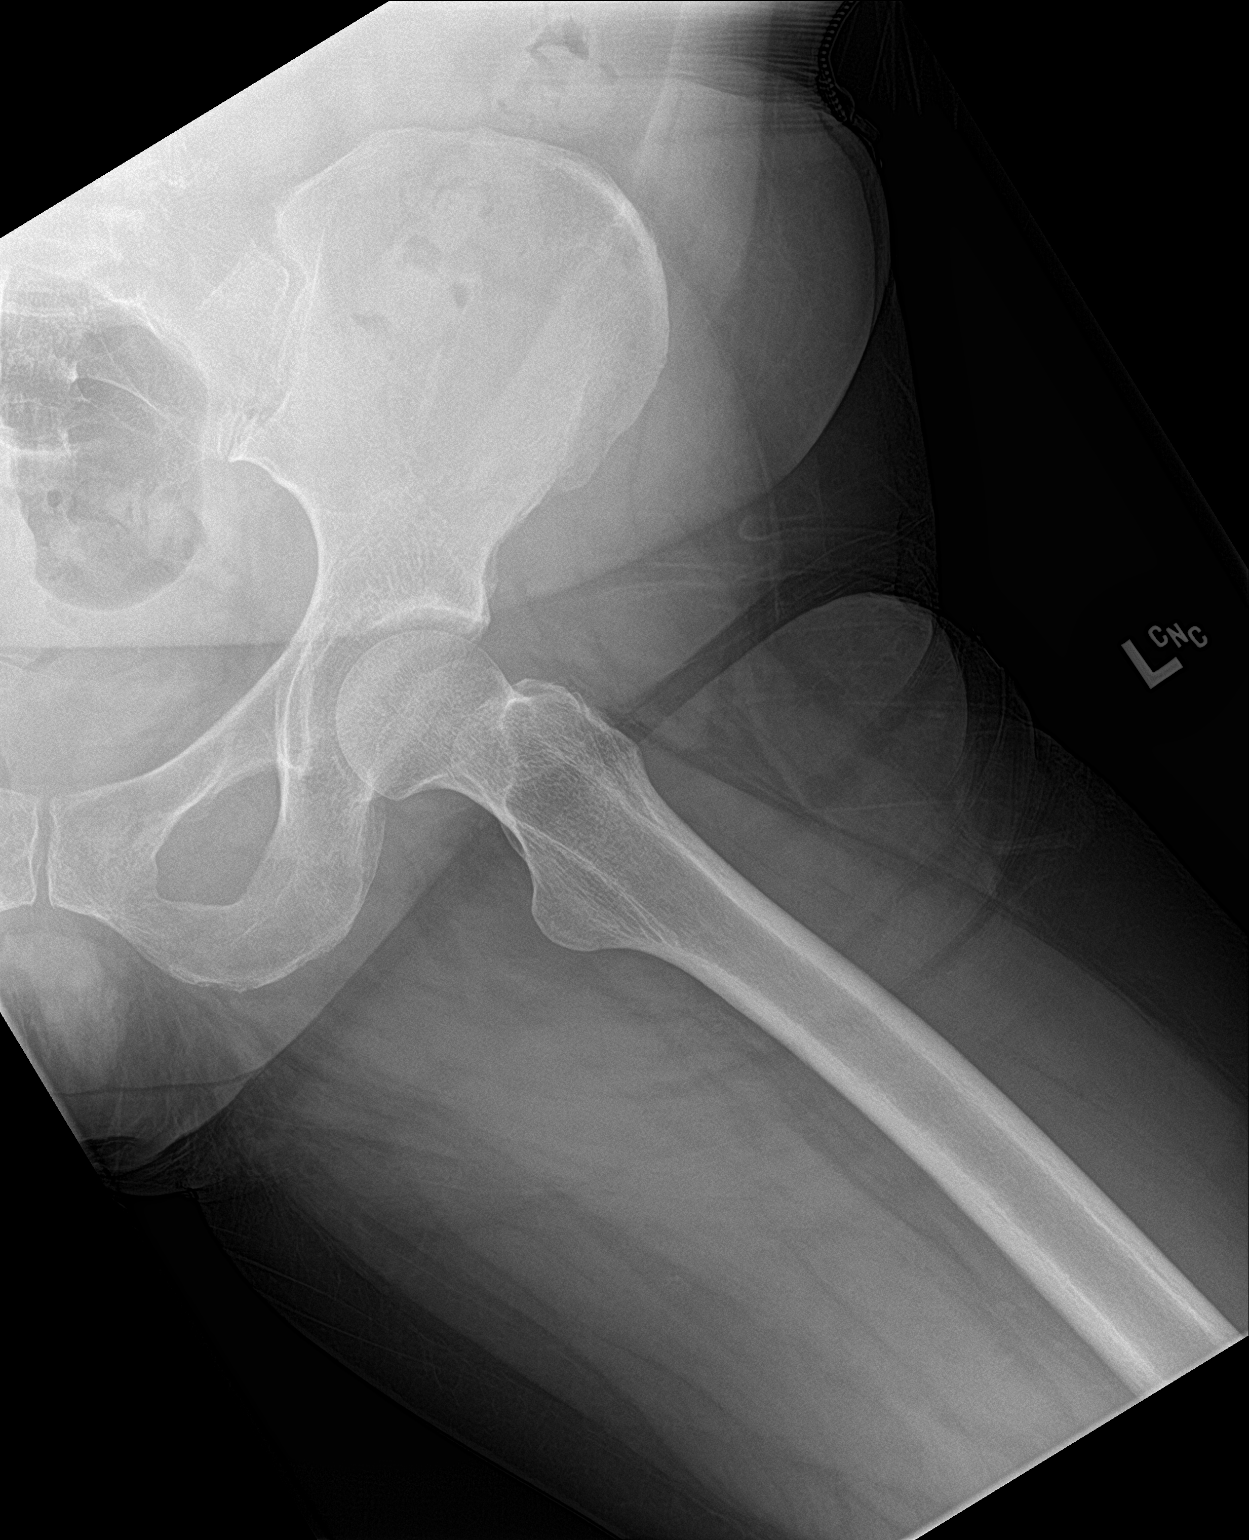

[3 of 3 positions shown; findings below may reference images not displayed]

FINDINGS: There is no acute bony or joint abnormality. Joint spaces are
preserved. No erosion, osteophytosis, subchondral sclerosis or
subchondral cyst formation is identified. No evidence of avascular
necrosis of the femoral heads. No focal bony lesion. Soft tissues
are negative.
IMPRESSION: Normal examination.

## 2022-09-16 ENCOUNTER — Inpatient Hospital Stay
Admission: EM | Admit: 2022-09-16 | Discharge: 2022-09-18 | DRG: 872 | Disposition: A | Discharge status: Home / Self Care | Payer: BC Managed Care – PPO | Attending: Student | Admitting: Student

## 2022-09-16 ENCOUNTER — Other Ambulatory Visit: Payer: Self-pay

## 2022-09-16 ENCOUNTER — Emergency Department: Payer: BC Managed Care – PPO

## 2022-09-16 ENCOUNTER — Inpatient Hospital Stay: Payer: BC Managed Care – PPO

## 2022-09-16 ENCOUNTER — Encounter: Payer: Self-pay | Admitting: Internal Medicine

## 2022-09-16 DIAGNOSIS — E869 Volume depletion, unspecified: Secondary | ICD-10-CM | POA: Diagnosis not present

## 2022-09-16 DIAGNOSIS — Z882 Allergy status to sulfonamides status: Secondary | ICD-10-CM

## 2022-09-16 DIAGNOSIS — N136 Pyonephrosis: Secondary | ICD-10-CM | POA: Diagnosis present

## 2022-09-16 DIAGNOSIS — Z885 Allergy status to narcotic agent status: Secondary | ICD-10-CM

## 2022-09-16 DIAGNOSIS — K5730 Diverticulosis of large intestine without perforation or abscess without bleeding: Secondary | ICD-10-CM | POA: Diagnosis not present

## 2022-09-16 DIAGNOSIS — M4317 Spondylolisthesis, lumbosacral region: Secondary | ICD-10-CM | POA: Diagnosis present

## 2022-09-16 DIAGNOSIS — Z83438 Family history of other disorder of lipoprotein metabolism and other lipidemia: Secondary | ICD-10-CM | POA: Diagnosis not present

## 2022-09-16 DIAGNOSIS — Z881 Allergy status to other antibiotic agents status: Secondary | ICD-10-CM

## 2022-09-16 DIAGNOSIS — E669 Obesity, unspecified: Secondary | ICD-10-CM | POA: Diagnosis not present

## 2022-09-16 DIAGNOSIS — N809 Endometriosis, unspecified: Secondary | ICD-10-CM | POA: Diagnosis present

## 2022-09-16 DIAGNOSIS — N200 Calculus of kidney: Secondary | ICD-10-CM | POA: Diagnosis not present

## 2022-09-16 DIAGNOSIS — M4306 Spondylolysis, lumbar region: Secondary | ICD-10-CM | POA: Diagnosis not present

## 2022-09-16 DIAGNOSIS — Z1152 Encounter for screening for COVID-19: Secondary | ICD-10-CM

## 2022-09-16 DIAGNOSIS — Z8249 Family history of ischemic heart disease and other diseases of the circulatory system: Secondary | ICD-10-CM

## 2022-09-16 DIAGNOSIS — N1330 Unspecified hydronephrosis: Secondary | ICD-10-CM | POA: Diagnosis not present

## 2022-09-16 DIAGNOSIS — F411 Generalized anxiety disorder: Secondary | ICD-10-CM | POA: Diagnosis not present

## 2022-09-16 DIAGNOSIS — Z791 Long term (current) use of non-steroidal anti-inflammatories (NSAID): Secondary | ICD-10-CM

## 2022-09-16 DIAGNOSIS — Z83719 Family history of colon polyps, unspecified: Secondary | ICD-10-CM

## 2022-09-16 DIAGNOSIS — Z9049 Acquired absence of other specified parts of digestive tract: Secondary | ICD-10-CM

## 2022-09-16 DIAGNOSIS — I10 Essential (primary) hypertension: Secondary | ICD-10-CM | POA: Diagnosis present

## 2022-09-16 DIAGNOSIS — K219 Gastro-esophageal reflux disease without esophagitis: Secondary | ICD-10-CM | POA: Diagnosis present

## 2022-09-16 DIAGNOSIS — A419 Sepsis, unspecified organism: Secondary | ICD-10-CM | POA: Diagnosis not present

## 2022-09-16 DIAGNOSIS — Z79899 Other long term (current) drug therapy: Secondary | ICD-10-CM

## 2022-09-16 DIAGNOSIS — Z8261 Family history of arthritis: Secondary | ICD-10-CM

## 2022-09-16 DIAGNOSIS — N390 Urinary tract infection, site not specified: Secondary | ICD-10-CM

## 2022-09-16 DIAGNOSIS — E271 Primary adrenocortical insufficiency: Secondary | ICD-10-CM | POA: Diagnosis not present

## 2022-09-16 DIAGNOSIS — Z87891 Personal history of nicotine dependence: Secondary | ICD-10-CM

## 2022-09-16 DIAGNOSIS — N12 Tubulo-interstitial nephritis, not specified as acute or chronic: Secondary | ICD-10-CM | POA: Diagnosis not present

## 2022-09-16 DIAGNOSIS — R109 Unspecified abdominal pain: Secondary | ICD-10-CM

## 2022-09-16 DIAGNOSIS — Z7951 Long term (current) use of inhaled steroids: Secondary | ICD-10-CM

## 2022-09-16 DIAGNOSIS — Z800 Family history of malignant neoplasm of digestive organs: Secondary | ICD-10-CM

## 2022-09-16 DIAGNOSIS — Z6830 Body mass index (BMI) 30.0-30.9, adult: Secondary | ICD-10-CM

## 2022-09-16 DIAGNOSIS — R1031 Right lower quadrant pain: Secondary | ICD-10-CM | POA: Diagnosis not present

## 2022-09-16 DIAGNOSIS — G894 Chronic pain syndrome: Secondary | ICD-10-CM | POA: Diagnosis present

## 2022-09-16 DIAGNOSIS — E871 Hypo-osmolality and hyponatremia: Secondary | ICD-10-CM | POA: Diagnosis not present

## 2022-09-16 DIAGNOSIS — Z87442 Personal history of urinary calculi: Secondary | ICD-10-CM

## 2022-09-16 DIAGNOSIS — N10 Acute tubulo-interstitial nephritis: Secondary | ICD-10-CM | POA: Diagnosis not present

## 2022-09-16 DIAGNOSIS — Z801 Family history of malignant neoplasm of trachea, bronchus and lung: Secondary | ICD-10-CM | POA: Diagnosis not present

## 2022-09-16 DIAGNOSIS — Z808 Family history of malignant neoplasm of other organs or systems: Secondary | ICD-10-CM | POA: Diagnosis not present

## 2022-09-16 DIAGNOSIS — Z7952 Long term (current) use of systemic steroids: Secondary | ICD-10-CM

## 2022-09-16 DIAGNOSIS — Z888 Allergy status to other drugs, medicaments and biological substances status: Secondary | ICD-10-CM

## 2022-09-16 DIAGNOSIS — R000 Tachycardia, unspecified: Secondary | ICD-10-CM | POA: Diagnosis not present

## 2022-09-16 LAB — URINALYSIS, ROUTINE W REFLEX MICROSCOPIC
Bacteria, UA: NONE SEEN
Bilirubin Urine: NEGATIVE
Glucose, UA: NEGATIVE mg/dL
Ketones, ur: 5 mg/dL — AB
Nitrite: NEGATIVE
Protein, ur: NEGATIVE mg/dL
Specific Gravity, Urine: 1.031 — ABNORMAL HIGH (ref 1.005–1.030)
WBC, UA: >50 WBC/hpf (ref 0–5)
pH: 6 (ref 5.0–8.0)

## 2022-09-16 LAB — COMPREHENSIVE METABOLIC PANEL
ALT: 36 U/L (ref 0–44)
AST: 36 U/L (ref 15–41)
Albumin: 4.1 g/dL (ref 3.5–5.0)
Alkaline Phosphatase: 63 U/L (ref 38–126)
Anion gap: 13 (ref 5–15)
BUN: 23 mg/dL — ABNORMAL HIGH (ref 6–20)
CO2: 17 mmol/L — ABNORMAL LOW (ref 22–32)
Calcium: 8.8 mg/dL — ABNORMAL LOW (ref 8.9–10.3)
Chloride: 98 mmol/L (ref 98–111)
Creatinine, Ser: 0.98 mg/dL (ref 0.44–1.00)
GFR, Estimated: >60 mL/min (ref >60)
Glucose, Bld: 122 mg/dL — ABNORMAL HIGH (ref 70–99)
Potassium: 4.3 mmol/L (ref 3.5–5.1)
Sodium: 128 mmol/L — ABNORMAL LOW (ref 135–145)
Total Bilirubin: 2 mg/dL — ABNORMAL HIGH (ref 0.3–1.2)
Total Protein: 8.2 g/dL — ABNORMAL HIGH (ref 6.5–8.1)

## 2022-09-16 LAB — CBC
HCT: 48.6 % — ABNORMAL HIGH (ref 36.0–46.0)
Hemoglobin: 16.6 g/dL — ABNORMAL HIGH (ref 12.0–15.0)
MCH: 31.4 pg (ref 26.0–34.0)
MCHC: 34.2 g/dL (ref 30.0–36.0)
MCV: 91.9 fL (ref 80.0–100.0)
Platelets: 182 10*3/uL (ref 150–400)
RBC: 5.29 MIL/uL — ABNORMAL HIGH (ref 3.87–5.11)
RDW: 12.4 % (ref 11.5–15.5)
WBC: 20.6 10*3/uL — ABNORMAL HIGH (ref 4.0–10.5)
nRBC: 0 % (ref 0.0–0.2)

## 2022-09-16 LAB — RESP PANEL BY RT-PCR (RSV, FLU A&B, COVID)  RVPGX2
Influenza A by PCR: NEGATIVE
Influenza B by PCR: NEGATIVE
Resp Syncytial Virus by PCR: NEGATIVE
SARS Coronavirus 2 by RT PCR: NEGATIVE

## 2022-09-16 LAB — LIPASE, BLOOD: Lipase: 38 U/L (ref 11–51)

## 2022-09-16 LAB — LACTIC ACID, PLASMA: Lactic Acid, Venous: 1.9 mmol/L (ref 0.5–1.9)

## 2022-09-16 MED ORDER — IOHEXOL 300 MG/ML  SOLN
100.0000 mL | Freq: Once | INTRAMUSCULAR | Status: AC | PRN
  Administered 2022-09-16: 100 mL via INTRAVENOUS

## 2022-09-16 MED ORDER — SODIUM CHLORIDE 0.9 % IV SOLN: 2.0000 g | INTRAVENOUS | Status: DC

## 2022-09-16 MED ORDER — LACTATED RINGERS IV BOLUS
1000.0000 mL | Freq: Once | INTRAVENOUS | Status: AC
  Administered 2022-09-16: 1000 mL via INTRAVENOUS

## 2022-09-16 MED ORDER — SODIUM CHLORIDE 0.9 % IV BOLUS
1000.0000 mL | Freq: Once | INTRAVENOUS | Status: AC
  Administered 2022-09-16: 1000 mL via INTRAVENOUS

## 2022-09-16 MED ORDER — HYDROCORTISONE SOD SUC (PF) 100 MG IJ SOLR
100.0000 mg | Freq: Two times a day (BID) | INTRAMUSCULAR | Status: DC
  Administered 2022-09-16 – 2022-09-18 (×5): 100 mg via INTRAVENOUS
  Filled 2022-09-16 (×6): qty 2

## 2022-09-16 MED ORDER — VITAMIN D (ERGOCALCIFEROL) 1.25 MG (50000 UNIT) PO CAPS
50000.0000 [IU] | ORAL_CAPSULE | ORAL | Status: DC
  Administered 2022-09-18: 50000 [IU] via ORAL
  Filled 2022-09-16: qty 1

## 2022-09-16 MED ORDER — SODIUM CHLORIDE 0.9 % IV SOLN
1.0000 g | Freq: Once | INTRAVENOUS | Status: AC
  Administered 2022-09-16: 1 g via INTRAVENOUS
  Filled 2022-09-16: qty 10

## 2022-09-16 MED ORDER — ESCITALOPRAM OXALATE 10 MG PO TABS
5.0000 mg | ORAL_TABLET | Freq: Every day | ORAL | Status: DC
  Administered 2022-09-16 – 2022-09-18 (×3): 5 mg via ORAL
  Filled 2022-09-16 (×3): qty 1

## 2022-09-16 MED ORDER — ONDANSETRON HCL 4 MG/2ML IJ SOLN
4.0000 mg | Freq: Four times a day (QID) | INTRAMUSCULAR | Status: DC | PRN
  Administered 2022-09-16 – 2022-09-17 (×3): 4 mg via INTRAVENOUS
  Filled 2022-09-16 (×4): qty 2

## 2022-09-16 MED ORDER — SODIUM CHLORIDE 0.9 % IV SOLN
2.0000 g | INTRAVENOUS | Status: DC
  Administered 2022-09-17 – 2022-09-18 (×2): 2 g via INTRAVENOUS
  Filled 2022-09-16 (×2): qty 2

## 2022-09-16 MED ORDER — ACETAMINOPHEN 325 MG PO TABS
650.0000 mg | ORAL_TABLET | Freq: Once | ORAL | Status: AC
  Administered 2022-09-16: 650 mg via ORAL
  Filled 2022-09-16: qty 2

## 2022-09-16 MED ORDER — ACETAMINOPHEN 650 MG RE SUPP: 650.0000 mg | Freq: Four times a day (QID) | RECTAL | Status: DC | PRN

## 2022-09-16 MED ORDER — HYDROCODONE-ACETAMINOPHEN 7.5-325 MG PO TABS
1.0000 | ORAL_TABLET | Freq: Three times a day (TID) | ORAL | Status: DC | PRN
  Administered 2022-09-16 – 2022-09-18 (×4): 1 via ORAL
  Filled 2022-09-16 (×4): qty 1

## 2022-09-16 MED ORDER — ONDANSETRON HCL 4 MG/2ML IJ SOLN
4.0000 mg | Freq: Once | INTRAMUSCULAR | Status: AC
  Administered 2022-09-16: 4 mg via INTRAVENOUS
  Filled 2022-09-16: qty 2

## 2022-09-16 MED ORDER — MORPHINE SULFATE (PF) 4 MG/ML IV SOLN
4.0000 mg | Freq: Once | INTRAVENOUS | Status: AC
  Administered 2022-09-16: 4 mg via INTRAVENOUS
  Filled 2022-09-16: qty 1

## 2022-09-16 MED ORDER — ACETAMINOPHEN 325 MG PO TABS
650.0000 mg | ORAL_TABLET | Freq: Four times a day (QID) | ORAL | Status: DC | PRN
  Administered 2022-09-18: 650 mg via ORAL
  Filled 2022-09-16: qty 2

## 2022-09-16 MED ORDER — MELOXICAM 7.5 MG PO TABS
15.0000 mg | ORAL_TABLET | Freq: Every day | ORAL | Status: DC
  Administered 2022-09-16 – 2022-09-18 (×3): 15 mg via ORAL
  Filled 2022-09-16 (×4): qty 2

## 2022-09-16 MED ORDER — FLUDROCORTISONE ACETATE 0.1 MG PO TABS
0.0500 mg | ORAL_TABLET | Freq: Every day | ORAL | Status: DC
  Administered 2022-09-17 – 2022-09-18 (×2): 0.05 mg via ORAL
  Filled 2022-09-16 (×2): qty 0.5

## 2022-09-16 MED ORDER — ALUM & MAG HYDROXIDE-SIMETH 200-200-20 MG/5ML PO SUSP
30.0000 mL | ORAL | Status: DC | PRN
  Administered 2022-09-16: 30 mL via ORAL
  Filled 2022-09-16: qty 30

## 2022-09-16 MED ORDER — ONDANSETRON HCL 4 MG PO TABS
4.0000 mg | ORAL_TABLET | Freq: Four times a day (QID) | ORAL | Status: DC | PRN
  Administered 2022-09-17: 4 mg via ORAL
  Filled 2022-09-16 (×2): qty 1

## 2022-09-16 MED ORDER — ENOXAPARIN SODIUM 40 MG/0.4ML IJ SOSY
40.0000 mg | PREFILLED_SYRINGE | INTRAMUSCULAR | Status: DC
  Administered 2022-09-16 – 2022-09-17 (×2): 40 mg via SUBCUTANEOUS
  Filled 2022-09-16 (×2): qty 0.4

## 2022-09-16 MED ORDER — LACTATED RINGERS IV SOLN: INTRAVENOUS | Status: AC

## 2022-09-16 NOTE — ED Notes (Signed)
MD at bedside. 

## 2022-09-16 NOTE — H&P (Signed)
History and Physical    Patient: Denise Burns [id number] DOB: April 30, 1971 DOA: 09/16/2022 DOS: the patient was seen and examined on 09/16/2022 PCP: [name], NP  Patient coming from: Home  Chief Complaint:  Chief Complaint  Patient presents with   Fever   Back Pain   HPI: Denise Burns is a 52 y.o. female with medical history significant for nephrolithiasis, Addison's disease, GERD who presents to the emergency room via private vehicle for evaluation of fever. Patient states that she had right flank pain for 2 days.  She rated her pain a 6 x 10 in intensity at its worst.  Pain was nonradiating and she denied having any dysuria, no frequency or hematuria.  Flank pain has been associated with a fever and she had a Tmax of 103.7.  She also complains of nausea, emesis and diarrhea.  She denies any recent antibiotic use. She denies having any cough, no abdominal pain, no headache, no dizziness, no lightheadedness, no leg swelling, no chest pain, no shortness of breath, no blurred vision or focal deficit. Abnormal labs include sodium 128, bicarb 17, white count 20.6 Urine analysis is pending CT scan of abdomen and pelvis with contrast shows mild right hydronephrosis with associated urothelial thickening and hyperenhancement extending along the right renal pelvis and proximal ureter. Associated patchy hypoenhancement of the right renal cortex consistent with pyelonephritis. 3.5 mm nonobstructing calculus in the right renal pelvis. Fluid-filled colon with questionable mild mucosal hyper enhancement throughout the colon. Correlate for diarrheal illness. Moderate distal descending and sigmoid diverticulosis without evidence of acute diverticulitis. L5 spondylolysis with grade 1 anterolisthesis of L5 on S1. She received 3 L IV fluid bolus in the ER and a dose of Rocephin 1 g She will be admitted to the hospital for further evaluation.  Review of Systems: As mentioned in the  history of present illness. All other systems reviewed and are negative. Past Medical History:  Diagnosis Date   Addison disease ([place])    Arthritis    Back pain    Complication of anesthesia    nausea   GERD (gastroesophageal reflux disease)    History of kidney stones    Past Surgical History:  Procedure Laterality Date   CHOLECYSTECTOMY  2010   COLONOSCOPY WITH PROPOFOL N/A 06/08/2022   Procedure: COLONOSCOPY WITH PROPOFOL;  Surgeon: Jonathon Bellows, MD;  Location: [facility];  Service: Gastroenterology;  Laterality: N/A;   CYSTOSCOPY W/ RETROGRADES Right 08/09/2021   Procedure: CYSTOSCOPY WITH RETROGRADE PYELOGRAM;  Surgeon: Hollice Espy, MD;  Location: [facility];  Service: Urology;  Laterality: Right;   CYSTOSCOPY W/ URETERAL STENT REMOVAL Right 08/09/2021   Procedure: CYSTOSCOPY WITH STENT REMOVAL;  Surgeon: Hollice Espy, MD;  Location: [facility];  Service: Urology;  Laterality: Right;   CYSTOSCOPY/URETEROSCOPY/HOLMIUM LASER/STENT PLACEMENT Right 06/28/2021   Procedure: CYSTOSCOPY/URETEROSCOPY/STENT PLACEMENT;  Surgeon: Hollice Espy, MD;  Location: [facility];  Service: Urology;  Laterality: Right;   CYSTOSCOPY/URETEROSCOPY/HOLMIUM LASER/STENT PLACEMENT Right 08/09/2021   Procedure: CYSTOSCOPY/URETEROSCOPY/HOLMIUM LASER/STENT PLACEMENT;  Surgeon: Hollice Espy, MD;  Location: [facility];  Service: Urology;  Laterality: Right;   dental work     DIAGNOSTIC LAPAROSCOPY  2004   endometriosis dx   DILATION AND CURETTAGE OF UTERUS  2010   IR NEPHROSTOMY PLACEMENT LEFT  12/14/2020   NEPHROLITHOTOMY Left 12/14/2020   Procedure: NEPHROLITHOTOMY PERCUTANEOUS;  Surgeon: Hollice Espy, MD;  Location: [facility];  Service: Urology;  Laterality: Left;   POLYPECTOMY     WISDOM TOOTH EXTRACTION  Social History:  reports that she quit smoking about 28 years ago. Her smoking use included cigarettes. She has never used smokeless tobacco. She reports that she does not currently use alcohol. She  reports that she does not use drugs.  Allergies  Allergen Reactions   Sulfacetamide Sodium (Acne)     Other reaction(s): sores   Tramadol Other (See Comments)    Other reaction(s): muscle tension   Lisinopril Rash and Cough   Sulfa Antibiotics Other (See Comments)    Blisters in mouth     Family History  Problem Relation Age of Onset   Arthritis Mother    Hyperlipidemia Mother    Hypertension Mother    High blood pressure Mother    Lung cancer Mother    Liver cancer Mother    Brain cancer Mother    Alcohol abuse Father    Hearing loss Father    Hyperlipidemia Father    High blood pressure Father    Colon polyps Maternal Grandmother 65   Colon cancer Neg Hx    Esophageal cancer Neg Hx    Stomach cancer Neg Hx    Rectal cancer Neg Hx     Prior to Admission medications   Medication Sig Start Date End Date Taking? Authorizing Provider  azelastine (ASTELIN) 0.1 % nasal spray Place 1 spray into both nostrils 2 (two) times daily. 09/13/22   [name], NP  ergocalciferol (DRISDOL) 1.25 MG (50000 UT) capsule Take 1 capsule (50,000 Units total) by mouth once a week. 06/22/22   [name], NP  escitalopram (LEXAPRO) 5 MG tablet Take 1 tablet (5 mg total) by mouth daily. 09/13/22   [name], NP  fludrocortisone (FLORINEF) 0.1 MG tablet Take 0.5 tablets (0.05 mg total) by mouth daily. 08/30/22   Shamleffer, Melanie Crazier, MD  fluticasone (FLONASE SENSIMIST) 27.5 MCG/SPRAY nasal spray Place 1 spray into the nose in the morning and at bedtime.    [provider]  HYDROcodone-acetaminophen (NORCO) 7.5-325 MG tablet Take 1 tablet by mouth every 8 (eight) hours as needed for severe pain. Must last 30 days. 08/01/22 08/31/22  Milinda Pointer, MD  HYDROcodone-acetaminophen ([place]) 7.5-325 MG tablet Take 1 tablet by mouth every 8 (eight) hours as needed for severe pain. Must last 30 days. 08/31/22 09/30/22  Milinda Pointer, MD  HYDROcodone-acetaminophen (NORCO)  7.5-325 MG tablet Take 1 tablet by mouth every 8 (eight) hours as needed for severe pain. Must last 30 days. 07/05/22 08/04/22  Milinda Pointer, MD  hydrocortisone (CORTEF) 10 MG tablet Take 1.5 tablets (15 mg total) by mouth with breakfast and 1/2 tablet in the afternoon. (May double up during sick days) 08/30/22   Shamleffer, Melanie Crazier, MD  meloxicam (MOBIC) 15 MG tablet Take 1 tablet (15 mg total) by mouth daily. 06/13/22   Lorrene Reid, PA-C  Methen-Hyosc-Meth Blue-Na Phos (UROGESIC-BLUE) 81.6 MG TABS Take 1 tablet (81.6 mg total) by mouth 4 (four) times daily as needed. 07/15/21   Hollice Espy, MD  naloxone [facility]) nasal spray 4 mg/0.1 mL Place 1 spray into the nose as needed for up to 365 doses (for opioid-induced respiratory depresssion). In case of emergency (overdose), spray once into each nostril. If no response within 3 minutes, repeat application and call [phone]. 06/29/22 06/29/23  Milinda Pointer, MD  pseudoephedrine (SUDAFED) 30 MG tablet Take 30 mg by mouth every 12 (twelve) hours as needed for congestion.     [provider]    Physical Exam: Vitals:  09/16/22 0945 09/16/22 1000 09/16/22 1015 09/16/22 1030  BP:    127/67  Pulse: 98 94 94 92  Resp:  (!) 21 (!) 21 (!) 21  Temp:      TempSrc:      SpO2:  90% 90% 92%   Physical Exam Vitals and nursing note reviewed.  Constitutional:      Appearance: Normal appearance.  HENT:     Head: Normocephalic and atraumatic.     Nose: Nose normal.     Mouth/Throat:     Mouth: Mucous membranes are moist.  Eyes:     Conjunctiva/sclera: Conjunctivae normal.  Cardiovascular:     Rate and Rhythm: Normal rate and regular rhythm.  Pulmonary:     Effort: Pulmonary effort is normal.     Breath sounds: Normal breath sounds.  Abdominal:     General: Abdomen is flat. Bowel sounds are normal.     Palpations: Abdomen is soft.     Tenderness: There is right CVA tenderness.  Musculoskeletal:        General: Normal range  of motion.     Cervical back: Normal range of motion and neck supple.  Skin:    General: Skin is warm and dry.  Neurological:     General: No focal deficit present.     Mental Status: She is alert and oriented to person, place, and time.  Psychiatric:        Mood and Affect: Mood normal.        Behavior: Behavior normal.     Data Reviewed: Relevant notes from primary care and specialist visits, past discharge summaries as available in EHR, including Care Everywhere. Prior diagnostic testing as pertinent to current admission diagnoses Updated medications and problem lists for reconciliation ED course, including vitals, labs, imaging, treatment and response to treatment Triage notes, nursing and pharmacy notes and ED provider's notes Notable results as noted in HPI Labs reviewed.  Lactic acid 1.9, lipase 38, sodium 128, potassium 4.3, chloride 98, bicarb 17, glucose 122, BUN 23, creatinine 0.98, calcium 8.8, total protein 8.2, albumin 4.1, AST 36, ALT 36, alkaline phosphatase 63, total bilirubin 2.0, white count 20.6, hemoglobin 16.6, hematocrit 48.6, platelet count 182 Respiratory viral panel is negative Twelve-lead EKG reviewed by me shows sinus rhythm with left axis deviation. There are no new results to review at this time.  Assessment and Plan: * Sepsis secondary to UTI [facility]) As evidenced by fever with a Tmax of 101.7, tachycardia, tachypnea, marked leukocytosis and CT scan findings suggestive of right-sided pyelonephritis. Continue aggressive IV fluid resuscitation Place patient empirically on Rocephin 2 g IV daily Follow-up results of blood and urine culture  Addison's disease ([place]) Will place patient on stress dose steroids due to acute infection Continue Florinef  Hyponatremia Most likely secondary to volume depletion from GI losses (nausea, vomiting and diarrhea) Continue IV fluid resuscitation Repeat electrolytes in a.m.  Hydronephrosis of right kidney Imaging shows  mild right hydronephrosis with associated urothelial thickening and hyperenhancement extending along the right renal pelvis and proximal ureter. Associated patchy hypoenhancement of the right renal cortex consistent with pyelonephritis.3.5 mm nonobstructing calculus in the right renal pelvis. Will consult urology  Chronic pain syndrome Continue hydrocodone  Generalized anxiety disorder Continue Lexapro      Advance Care Planning:   Code Status: Full Code   Consults: Urology  Family Communication: Greater than 50% of time was spent discussing patient's condition and plan of care with her at the bedside.  All questions  and concerns have been addressed.  She verbalizes understanding and agrees with the plan.  Severity of Illness: The appropriate patient status for this patient is INPATIENT. Inpatient status is judged to be reasonable and necessary in order to provide the required intensity of service to ensure the patient's safety. The patient's presenting symptoms, physical exam findings, and initial radiographic and laboratory data in the context of their chronic comorbidities is felt to place them at high risk for further clinical deterioration. Furthermore, it is not anticipated that the patient will be medically stable for discharge from the hospital within 2 midnights of admission.   * I certify that at the point of admission it is my clinical judgment that the patient will require inpatient hospital care spanning beyond 2 midnights from the point of admission due to high intensity of service, high risk for further deterioration and high frequency of surveillance required.*  Author: Collier Bullock, MD 09/16/2022 11:33 AM  For on call review [email or web address].

## 2022-09-16 NOTE — Assessment & Plan Note (Signed)
Imaging shows mild right hydronephrosis with associated urothelial thickening and hyperenhancement extending along the right renal pelvis and proximal ureter. Associated patchy hypoenhancement of the right renal cortex consistent with pyelonephritis.3.5 mm nonobstructing calculus in the right renal pelvis. Will consult urology

## 2022-09-16 NOTE — Consult Note (Signed)
Urology Consult  Requesting physician: Woody Seller, MD   Reason for consultation: Right hydronephrosis, pyelonephritis  Chief Complaint: Flank pain, fever  History of Present Illness: Denise Burns is a 52 y.o. with a history of recurrent stone disease with prior PCNL in 2022 and ureteroscopy in 2020.  She actually saw Dr. Erlene Quan 09/13/2022 and follow-up.  Was having right flank pain back in October 2023 and CT showed a nonobstructing right lower pole renal calculus.  She was asymptomatic at her appointment and KUB was unremarkable.  The evening after appointment she developed right flank pain and yesterday had increasing pain, nausea, vomiting and fever to 103 degrees.  She presented to the ED this morning for further evaluation.  Her temp was 99.5, BP 106/90 and pulse 125.  CBC with leukocytosis 20.6; UA with 6-10 RBC/ >50 WBC; lactate normal at 1.9.  CT abdomen pelvis with contrast was performed which showed a 3 mm nonobstructing calculus in the right renal pelvis.  There was mild hydronephrosis with urothelial thickening and hyperenhancement in the renal pelvis and proximal ureter.  Patchy hypoenhancement of the left renal cortex seen consistent with pyelonephritis.  Temp spike to 101.7 at 9 AM this morning and has been afebrile since that time.  She received IV Rocephin in the ED which has been continued.  Since admission her flank pain has improved    Past Medical History:  Diagnosis Date   Addison disease ([place])    Arthritis    Back pain    Complication of anesthesia    nausea   GERD (gastroesophageal reflux disease)    History of kidney stones     Past Surgical History:  Procedure Laterality Date   CHOLECYSTECTOMY  2010   COLONOSCOPY WITH PROPOFOL N/A 06/08/2022   Procedure: COLONOSCOPY WITH PROPOFOL;  Surgeon: Jonathon Bellows, MD;  Location: [facility] ENDOSCOPY;  Service: Gastroenterology;  Laterality: N/A;   CYSTOSCOPY W/ RETROGRADES Right 08/09/2021   Procedure:  CYSTOSCOPY WITH RETROGRADE PYELOGRAM;  Surgeon: Hollice Espy, MD;  Location: [facility] ORS;  Service: Urology;  Laterality: Right;   CYSTOSCOPY W/ URETERAL STENT REMOVAL Right 08/09/2021   Procedure: CYSTOSCOPY WITH STENT REMOVAL;  Surgeon: Hollice Espy, MD;  Location: [facility] ORS;  Service: Urology;  Laterality: Right;   CYSTOSCOPY/URETEROSCOPY/HOLMIUM LASER/STENT PLACEMENT Right 06/28/2021   Procedure: CYSTOSCOPY/URETEROSCOPY/STENT PLACEMENT;  Surgeon: Hollice Espy, MD;  Location: [facility] ORS;  Service: Urology;  Laterality: Right;   CYSTOSCOPY/URETEROSCOPY/HOLMIUM LASER/STENT PLACEMENT Right 08/09/2021   Procedure: CYSTOSCOPY/URETEROSCOPY/HOLMIUM LASER/STENT PLACEMENT;  Surgeon: Hollice Espy, MD;  Location: [facility] ORS;  Service: Urology;  Laterality: Right;   dental work     DIAGNOSTIC LAPAROSCOPY  2004   endometriosis dx   DILATION AND CURETTAGE OF UTERUS  2010   IR NEPHROSTOMY PLACEMENT LEFT  12/14/2020   NEPHROLITHOTOMY Left 12/14/2020   Procedure: NEPHROLITHOTOMY PERCUTANEOUS;  Surgeon: Hollice Espy, MD;  Location: [facility] ORS;  Service: Urology;  Laterality: Left;   POLYPECTOMY     WISDOM TOOTH EXTRACTION      Home Medications:  Current Meds  Medication Sig   azelastine (ASTELIN) 0.1 % nasal spray Place 1 spray into both nostrils 2 (two) times daily.   fludrocortisone (FLORINEF) 0.1 MG tablet Take 0.5 tablets (0.05 mg total) by mouth daily.   HYDROcodone-acetaminophen (NORCO) 7.5-325 MG tablet Take 1 tablet by mouth every 8 (eight) hours as needed for severe pain. Must last 30 days.   hydrocortisone (CORTEF) 10 MG tablet Take 1.5 tablets (15 mg total) by mouth with breakfast and 1/2  tablet in the afternoon. (May double up during sick days)   omega-3 acid ethyl esters (LOVAZA) 1 g capsule Take 1 g by mouth daily.    Allergies:  Allergies  Allergen Reactions   Sulfacetamide Sodium (Acne)     Other reaction(s): sores   Tramadol Other (See Comments)    Other reaction(s): muscle  tension   Lisinopril Rash and Cough   Sulfa Antibiotics Other (See Comments)    Blisters in mouth     Family History  Problem Relation Age of Onset   Arthritis Mother    Hyperlipidemia Mother    Hypertension Mother    High blood pressure Mother    Lung cancer Mother    Liver cancer Mother    Brain cancer Mother    Alcohol abuse Father    Hearing loss Father    Hyperlipidemia Father    High blood pressure Father    Colon polyps Maternal Grandmother 65   Colon cancer Neg Hx    Esophageal cancer Neg Hx    Stomach cancer Neg Hx    Rectal cancer Neg Hx     Social History:  reports that she quit smoking about 28 years ago. Her smoking use included cigarettes. She has never used smokeless tobacco. She reports that she does not currently use alcohol. She reports that she does not use drugs.  ROS: All systems are negative except for pertinent findings as noted.  Physical Exam:  Vital signs in last 24 hours: Temp:  [97.6 F (36.4 C)-101.7 F (38.7 C)] 97.6 F (36.4 C) (02/16 1510) Pulse Rate:  [74-125] 74 (02/16 1510) Resp:  [15-21] 15 (02/16 1510) BP: (92-127)/(57-90) 92/57 (02/16 1510) SpO2:  [90 %-97 %] 97 % (02/16 1510) Weight:  [79.8 kg] 79.8 kg (02/16 1510) Constitutional:  Alert, No acute distress HEENT: [place] AT Respiratory: Normal respiratory effort Psychiatric: Normal mood and affect   Laboratory Data:  Recent Labs    09/16/22 0828  WBC 20.6*  HGB 16.6*  HCT 48.6*   Recent Labs    09/16/22 0828  NA 128*  K 4.3  CL 98  CO2 17*  GLUCOSE 122*  BUN 23*  CREATININE 0.98  CALCIUM 8.8*   No results for input(s): "LABPT", "INR" in the last 72 hours. No results for input(s): "LABURIN" in the last 72 hours. Results for orders placed or performed during the hospital encounter of 09/16/22  Resp panel by RT-PCR (RSV, Flu A&B, Covid) Anterior Nasal Swab     Status: None   Collection Time: 09/16/22  8:49 AM   Specimen: Anterior Nasal Swab  Result Value Ref Range  Status   SARS Coronavirus 2 by RT PCR NEGATIVE NEGATIVE Final    Comment: (NOTE) SARS-CoV-2 target nucleic acids are NOT DETECTED.  The SARS-CoV-2 RNA is generally detectable in upper respiratory specimens during the acute phase of infection. The lowest concentration of SARS-CoV-2 viral copies this assay can detect is 138 copies/mL. A negative result does not preclude SARS-Cov-2 infection and should not be used as the sole basis for treatment or other patient management decisions. A negative result may occur with  improper specimen collection/handling, submission of specimen other than nasopharyngeal swab, presence of viral mutation(s) within the areas targeted by this assay, and inadequate number of viral copies(<138 copies/mL). A negative result must be combined with clinical observations, patient history, and epidemiological information. The expected result is Negative.  Fact Sheet for Patients:  EntrepreneurPulse.com.au  Fact Sheet for Healthcare Providers:  IncredibleEmployment.be  This test is no t yet approved or cleared by the [place] and  has been authorized for detection and/or diagnosis of SARS-CoV-2 by FDA under an Emergency Use Authorization (EUA). This EUA will remain  in effect (meaning this test can be used) for the duration of the COVID-19 declaration under Section 564(b)(1) of the Act, 21 U.S.C.section 360bbb-3(b)(1), unless the authorization is terminated  or revoked sooner.       Influenza A by PCR NEGATIVE NEGATIVE Final   Influenza B by PCR NEGATIVE NEGATIVE Final    Comment: (NOTE) The Xpert Xpress SARS-CoV-2/FLU/RSV plus assay is intended as an aid in the diagnosis of influenza from Nasopharyngeal swab specimens and should not be used as a sole basis for treatment. Nasal washings and aspirates are unacceptable for Xpert Xpress SARS-CoV-2/FLU/RSV testing.  Fact Sheet for  Patients: EntrepreneurPulse.com.au  Fact Sheet for Healthcare Providers: IncredibleEmployment.be  This test is not yet approved or cleared by the [place] FDA and has been authorized for detection and/or diagnosis of SARS-CoV-2 by FDA under an Emergency Use Authorization (EUA). This EUA will remain in effect (meaning this test can be used) for the duration of the COVID-19 declaration under Section 564(b)(1) of the Act, 21 U.S.C. section 360bbb-3(b)(1), unless the authorization is terminated or revoked.     Resp Syncytial Virus by PCR NEGATIVE NEGATIVE Final    Comment: (NOTE) Fact Sheet for Patients: EntrepreneurPulse.com.au  Fact Sheet for Healthcare Providers: IncredibleEmployment.be  This test is not yet approved or cleared by the [place] FDA and has been authorized for detection and/or diagnosis of SARS-CoV-2 by FDA under an Emergency Use Authorization (EUA). This EUA will remain in effect (meaning this test can be used) for the duration of the COVID-19 declaration under Section 564(b)(1) of the Act, 21 U.S.C. section 360bbb-3(b)(1), unless the authorization is terminated or revoked.  Performed at [facility], [address], [place], [place] [address]      Radiologic Imaging: CT images were personally reviewed and interpreted.  Although there is mild hydronephrosis there is prompt bilateral uptake of contrast  CT Abdomen Pelvis W Contrast  Result Date: 09/16/2022 CLINICAL DATA:  Abdominal/flank pain, stone suspected. Fever with right flank and right lower quadrant pain. EXAM: CT ABDOMEN AND PELVIS WITH CONTRAST TECHNIQUE: Multidetector CT imaging of the abdomen and pelvis was performed using the standard protocol following bolus administration of intravenous contrast. RADIATION DOSE REDUCTION: This exam was performed according to the departmental dose-optimization program which  includes automated exposure control, adjustment of the mA and/or kV according to patient size and/or use of iterative reconstruction technique. CONTRAST:  137m OMNIPAQUE IOHEXOL 300 MG/ML  SOLN COMPARISON:  CT abdomen/pelvis 05/03/2022. FINDINGS: Lower chest: No acute abnormality. Hepatobiliary: Focal fat along the falciform ligament. Prior cholecystectomy. No biliary dilatation. Pancreas: Unremarkable. No pancreatic ductal dilatation or surrounding inflammatory changes. Spleen: Normal. Adrenals/Urinary Tract: Atrophic adrenal glands. 3.5 mm nonobstructing calculus in the right renal pelvis. Mild right hydronephrosis with associated urothelial thickening and hyperenhancement extending along the pelvis and proximal ureter (image 37 series 2). Associated patchy hypoenhancement of the left renal cortex (image 23 series 3), consistent with pyelonephritis. The distal ureters unremarkable. Bladder is decompressed. Normal left kidney. Stomach/Bowel: Normal stomach and duodenum. No dilated loops of small bowel. Normal appendix is visualized on sagittal image 48 series 7. Fluid-filled colon. Moderate distal descending and sigmoid diverticulosis without evidence of acute diverticulitis. Questionable mild mucosal hyperenhancement throughout the colon. No wall thickening or surrounding inflammation. Vascular/Lymphatic: No  significant vascular findings are present. No enlarged abdominal or pelvic lymph nodes. Reproductive: Uterus and bilateral adnexa are unremarkable. Other: No abdominal wall hernia or abnormality. No abdominopelvic ascites. Musculoskeletal: L5 spondylolysis with grade 1 anterolisthesis of L5 on S1. No suspicious bone lesions. IMPRESSION: 1. Mild right hydronephrosis with associated urothelial thickening and hyperenhancement extending along the right renal pelvis and proximal ureter. Associated patchy hypoenhancement of the right renal cortex consistent with pyelonephritis. 2. 3.5 mm nonobstructing calculus in  the right renal pelvis. 3. Fluid-filled colon with questionable mild mucosal hyperenhancement throughout the colon. Correlate for diarrheal illness. 4. Moderate distal descending and sigmoid diverticulosis without evidence of acute diverticulitis. 5. L5 spondylolysis with grade 1 anterolisthesis of L5 on S1. Electronically Signed   By: Emmit Alexanders M.D.   On: 09/16/2022 09:50    Impression/Assessment:  52 y.o. female with right pyelonephritis.  She has a nonobstructing stone in the renal pelvis and it is possible this stone may have earlier been at the UPJ/proximal ureter.  There does not appear to be significant obstruction as there was prompt bilateral uptake of contrast on CT  Recommendation:  KUB ordered to assess for persistent contrast that would be indicative of high-grade obstruction She is stable and no indication for urgent stent placement at this time Continue IV antibiotic therapy Stent placement for clinical deterioration and evidence of significant obstruction Will follow   09/16/2022, 5:34 PM  John Giovanni,  MD

## 2022-09-16 NOTE — Assessment & Plan Note (Signed)
Most likely secondary to volume depletion from GI losses (nausea, vomiting and diarrhea) Continue IV fluid resuscitation Repeat electrolytes in a.m.

## 2022-09-16 NOTE — Assessment & Plan Note (Signed)
Will place patient on stress dose steroids due to acute infection Continue Florinef

## 2022-09-16 NOTE — Assessment & Plan Note (Signed)
Continue Lexapro

## 2022-09-16 NOTE — Assessment & Plan Note (Signed)
Continue hydrocodone

## 2022-09-16 NOTE — ED Notes (Signed)
Dr Agbata at bedside. 

## 2022-09-16 NOTE — Assessment & Plan Note (Addendum)
As evidenced by fever with a Tmax of 101.7, tachycardia, tachypnea, marked leukocytosis and CT scan findings suggestive of right-sided pyelonephritis. Continue aggressive IV fluid resuscitation Place patient empirically on Rocephin 2 g IV daily Follow-up results of blood and urine culture

## 2022-09-16 NOTE — ED Provider Notes (Addendum)
[facility] Provider Note    Event Date/Time   First MD Initiated Contact with Patient 09/16/22 [phone]     (approximate)   History   Fever and Back Pain   HPI  Denise Burns is a 52 y.o. female past medical history significant for hypertension, Addison's disease, prior kidney stones, who presents to the emergency department with fever and not feeling well with nausea and vomiting.  Started having right-sided flank pain on Tuesday after being evaluated at her urologist.  On Wednesday developed a fever and states that she has had a fever with nausea and vomiting since that time.  Fever of 103.  No burning with urination.  Normal urine output.  Denies cough or shortness of breath.  No chest pain.  States that she has been able to tolerate and keep down her home steroids for her Addison's disease back has had recurrent episodes of vomiting and states that she has not been unable to keep down some of her other medications.     Physical Exam   Triage Vital Signs: ED Triage Vitals [09/16/22 0824]  Enc Vitals Group     BP (!) 106/90     Pulse Rate (!) 125     Resp 16     Temp 99.5 F (37.5 C)     Temp Source Oral     SpO2 96 %     Weight      Height      Head Circumference      Peak Flow      Pain Score 4     Pain Loc      Pain Edu?      Excl. in [place]?     Most recent vital signs: Vitals:   09/16/22 0945 09/16/22 1000  BP:    Pulse: 98 94  Resp:  (!) 21  Temp:    SpO2:  90%    Physical Exam Constitutional:      General: She is in acute distress.     Appearance: She is well-developed.  HENT:     Head: Atraumatic.  Eyes:     Conjunctiva/sclera: Conjunctivae normal.  Cardiovascular:     Rate and Rhythm: Regular rhythm. Tachycardia present.  Pulmonary:     Effort: No respiratory distress.  Abdominal:     General: There is no distension.     Tenderness: There is right CVA tenderness.  Musculoskeletal:        General: Normal range of  motion.     Cervical back: Normal range of motion.  Skin:    General: Skin is warm.  Neurological:     Mental Status: She is alert. Mental status is at baseline.     IMPRESSION / MDM / ASSESSMENT AND PLAN / ED COURSE  I reviewed the triage vital signs and the nursing notes.  On chart review patient was evaluated with her urologist Dr. Erlene Quan on 09/12/2022, has a history of right-sided kidney stones and has had 2 urologic procedures done in 2020 and 2022.  Recommended that the patient return in 2 years for KUB.  Differential diagnosis including pyelonephritis, infected kidney stone, acute appendicitis, viral illness included COVID/influenza, intra-abdominal abscess  Given fever and tachycardia blood cultures obtained.  Felt that 30 cc/kg of IV fluids may be detrimental to the patient, given 1 L of IV fluids, improvement of BP after 1L.   After 1 L of IV fluids patient's blood pressure remains normotensive.  Continues to look  volume depleted, will give another 2 L of IV fluids for sepsis criteria.  EKG  I, Nathaniel Man, the attending physician, personally viewed and interpreted this ECG.   Rate: Normal  Rhythm: Sinus tachycardia  Axis: Normal  Intervals: Normal  ST&T Change: None  Sinus tachycardia while on cardiac telemetry.  RADIOLOGY I independently reviewed imaging, my interpretation of imaging: CT scan of the abdomen and pelvis -no obvious hydronephrosis.  Does have some inflammation to the right kidney.  Read as right-sided hydronephrosis that is mild with hyperenhancement to the right renal pelvis and proximal ureter consistent with pyelonephritis.  2.5 mm nonobstructing calculus in the right renal pelvis.  Fluid-filled colon noted.  No diarrheal illness.  LABS (all labs ordered are listed, but only abnormal results are displayed) Labs interpreted as -  Normal lactic acid.  Hyponatremia which is likely in the setting of volume depletion from dehydration from nausea and  vomiting.  None anion gap acidosis with a CO2 of 17.  No recent diarrhea.  Significant leukocytosis of 20.  Lactic acid within normal limits.  Labs Reviewed  COMPREHENSIVE METABOLIC PANEL - Abnormal; Notable for the following components:      Result Value   Sodium 128 (*)    CO2 17 (*)    Glucose, Bld 122 (*)    BUN 23 (*)    Calcium 8.8 (*)    Total Protein 8.2 (*)    Total Bilirubin 2.0 (*)    All other components within normal limits  CBC - Abnormal; Notable for the following components:   WBC 20.6 (*)    RBC 5.29 (*)    Hemoglobin 16.6 (*)    HCT 48.6 (*)    All other components within normal limits  RESP PANEL BY RT-PCR (RSV, FLU A&B, COVID)  RVPGX2  CULTURE, BLOOD (ROUTINE X 2)  CULTURE, BLOOD (ROUTINE X 2)  LIPASE, BLOOD  LACTIC ACID, PLASMA  URINALYSIS, ROUTINE W REFLEX MICROSCOPIC  URINALYSIS, W/ REFLEX TO CULTURE (INFECTION SUSPECTED)    TREATMENT  1 L of IV fluids.  IV Zofran, IV morphine  On reevaluation continue to be tachycardic.  Given another 1 L of IV fluids and IV Rocephin to cover for pyelonephritis.  Given 1L LR.  Total 3L IVF.   On chart review has a history of urinary tract infection prior urine culture resistant only to tetracyclines and fluoroquinolones.  MDM  On reevaluation patient is feeling better.  Continues to have ongoing nausea.  Consulted hospitalist for admission for pyelonephritis and sepsis.     PROCEDURES:  Critical Care performed: yes  .Critical Care  Performed by: Nathaniel Man, MD Authorized by: Nathaniel Man, MD   Critical care provider statement:    Critical care time (minutes):  30   Critical care time was exclusive of:  Separately billable procedures and treating other patients   Critical care was necessary to treat or prevent imminent or life-threatening deterioration of the following conditions:  Sepsis   Critical care was time spent personally by me on the following activities:  Development of treatment plan with  patient or surrogate, discussions with consultants, evaluation of patient's response to treatment, examination of patient, ordering and review of laboratory studies, ordering and review of radiographic studies, ordering and performing treatments and interventions, pulse oximetry, re-evaluation of patient's condition and review of old charts   Patient's presentation is most consistent with acute presentation with potential threat to life or bodily function.   MEDICATIONS ORDERED IN ED: Medications  sodium chloride 0.9 % bolus 1,000 mL (has no administration in time range)  lactated ringers bolus 1,000 mL (has no administration in time range)  sodium chloride 0.9 % bolus 1,000 mL (0 mLs Intravenous Stopped 09/16/22 1016)  ondansetron (ZOFRAN) injection 4 mg (4 mg Intravenous Given 09/16/22 0909)  morphine (PF) 4 MG/ML injection 4 mg (4 mg Intravenous Given 09/16/22 0910)  acetaminophen (TYLENOL) tablet 650 mg (650 mg Oral Given 09/16/22 0947)  iohexol (OMNIPAQUE) 300 MG/ML solution 100 mL (100 mLs Intravenous Contrast Given 09/16/22 0925)  cefTRIAXone (ROCEPHIN) 1 g in sodium chloride 0.9 % 100 mL IVPB (1 g Intravenous New Bag/Given 09/16/22 1000)    FINAL CLINICAL IMPRESSION(S) / ED DIAGNOSES   Final diagnoses:  Right flank pain  Pyelonephritis  Sepsis, due to unspecified organism, unspecified whether acute organ dysfunction present [facility])     Rx / DC Orders   ED Discharge Orders     None        Note:  This document was prepared using Dragon voice recognition software and may include unintentional dictation errors.   Nathaniel Man, MD 09/16/22 1030    Nathaniel Man, MD 09/16/22 1037

## 2022-09-16 NOTE — ED Notes (Signed)
Pt. To CT

## 2022-09-16 NOTE — ED Triage Notes (Addendum)
Pt to ED via POV with complaints of fever x2 days. Pt reports highest temp 103.7. Pt states that she has hx of kidney stones and reports right sided back pain. Pt states the back pain started Tuesday. Pt denies any urinary changes, hematuria, or oliguria, or pain when urinating.

## 2022-09-17 ENCOUNTER — Inpatient Hospital Stay: Payer: BC Managed Care – PPO

## 2022-09-17 DIAGNOSIS — N12 Tubulo-interstitial nephritis, not specified as acute or chronic: Secondary | ICD-10-CM | POA: Diagnosis not present

## 2022-09-17 DIAGNOSIS — N200 Calculus of kidney: Secondary | ICD-10-CM | POA: Diagnosis not present

## 2022-09-17 DIAGNOSIS — N1330 Unspecified hydronephrosis: Secondary | ICD-10-CM | POA: Diagnosis not present

## 2022-09-17 DIAGNOSIS — A419 Sepsis, unspecified organism: Secondary | ICD-10-CM | POA: Diagnosis not present

## 2022-09-17 DIAGNOSIS — N390 Urinary tract infection, site not specified: Secondary | ICD-10-CM | POA: Diagnosis not present

## 2022-09-17 LAB — URINE CULTURE: Culture: <10000 — AB

## 2022-09-17 LAB — BASIC METABOLIC PANEL
Anion gap: 10 (ref 5–15)
BUN: 21 mg/dL — ABNORMAL HIGH (ref 6–20)
CO2: 23 mmol/L (ref 22–32)
Calcium: 8.9 mg/dL (ref 8.9–10.3)
Chloride: 108 mmol/L (ref 98–111)
Creatinine, Ser: 0.72 mg/dL (ref 0.44–1.00)
GFR, Estimated: >60 mL/min (ref >60)
Glucose, Bld: 151 mg/dL — ABNORMAL HIGH (ref 70–99)
Potassium: 4.6 mmol/L (ref 3.5–5.1)
Sodium: 136 mmol/L (ref 135–145)

## 2022-09-17 LAB — CBC
HCT: 40.5 % (ref 36.0–46.0)
Hemoglobin: 13.4 g/dL (ref 12.0–15.0)
MCH: 31 pg (ref 26.0–34.0)
MCHC: 33.1 g/dL (ref 30.0–36.0)
MCV: 93.8 fL (ref 80.0–100.0)
Platelets: 163 10*3/uL (ref 150–400)
RBC: 4.32 MIL/uL (ref 3.87–5.11)
RDW: 12.4 % (ref 11.5–15.5)
WBC: 12 10*3/uL — ABNORMAL HIGH (ref 4.0–10.5)
nRBC: 0 % (ref 0.0–0.2)

## 2022-09-17 LAB — PROTIME-INR
INR: 1.1 (ref 0.8–1.2)
Prothrombin Time: 14.4 seconds (ref 11.4–15.2)

## 2022-09-17 LAB — PROCALCITONIN: Procalcitonin: 1.32 ng/mL

## 2022-09-17 LAB — HIV ANTIBODY (ROUTINE TESTING W REFLEX): HIV Screen 4th Generation wRfx: NONREACTIVE

## 2022-09-17 LAB — CORTISOL-AM, BLOOD: Cortisol - AM: 40.5 ug/dL — ABNORMAL HIGH (ref 6.7–22.6)

## 2022-09-17 MED ORDER — SODIUM CHLORIDE 0.9 % IV SOLN: INTRAVENOUS | Status: DC | PRN

## 2022-09-17 MED ORDER — PANTOPRAZOLE SODIUM 40 MG PO TBEC
40.0000 mg | DELAYED_RELEASE_TABLET | Freq: Every day | ORAL | Status: DC
  Administered 2022-09-17 – 2022-09-18 (×2): 40 mg via ORAL
  Filled 2022-09-17 (×2): qty 1

## 2022-09-17 NOTE — Progress Notes (Signed)
Urology Consult Follow Up  Subjective: Feeling much better; flank pain significantly improved.  Afebrile >24 hours.  Leukocytosis improved. Urine culture with insignificant growth.  Preliminary blood cultures no growth  Anti-infectives: Anti-infectives (From admission, onward)    Start     Dose/Rate Route Frequency Ordered Stop   09/17/22 1000  cefTRIAXone (ROCEPHIN) 2 g in sodium chloride 0.9 % 100 mL IVPB  Status:  Discontinued        2 g 200 mL/hr over 30 Minutes Intravenous Every 24 hours 09/16/22 1054 09/16/22 1231   09/17/22 1000  cefTRIAXone (ROCEPHIN) 2 g in sodium chloride 0.9 % 100 mL IVPB        2 g 200 mL/hr over 30 Minutes Intravenous Every 24 hours 09/16/22 1231 09/23/22 0959   09/16/22 1100  cefTRIAXone (ROCEPHIN) 1 g in sodium chloride 0.9 % 100 mL IVPB        1 g 200 mL/hr over 30 Minutes Intravenous  Once 09/16/22 1054 09/16/22 1621   09/16/22 1015  cefTRIAXone (ROCEPHIN) 1 g in sodium chloride 0.9 % 100 mL IVPB        1 g 200 mL/hr over 30 Minutes Intravenous  Once 09/16/22 1004 09/16/22 1030       Current Facility-Administered Medications  Medication Dose Route Frequency Provider Last Rate Last Admin   0.9 %  sodium chloride infusion   Intravenous PRN Val Riles, MD   Stopped at 09/17/22 1049   acetaminophen (TYLENOL) tablet 650 mg  650 mg Oral Q6H PRN Agbata, Tochukwu, MD       Or   acetaminophen (TYLENOL) suppository 650 mg  650 mg Rectal Q6H PRN Agbata, Tochukwu, MD       alum & mag hydroxide-simeth (MAALOX/MYLANTA) 200-200-20 MG/5ML suspension 30 mL  30 mL Oral Q4H PRN Agbata, Tochukwu, MD   30 mL at 09/16/22 2121   cefTRIAXone (ROCEPHIN) 2 g in sodium chloride 0.9 % 100 mL IVPB  2 g Intravenous Q24H Dorothe Pea, RPH   Stopped at 09/17/22 0935   enoxaparin (LOVENOX) injection 40 mg  40 mg Subcutaneous Q24H Agbata, Tochukwu, MD   40 mg at 09/16/22 2122   escitalopram (LEXAPRO) tablet 5 mg  5 mg Oral Daily Agbata, Tochukwu, MD   5 mg at 09/17/22 0854    fludrocortisone (FLORINEF) tablet 0.05 mg  0.05 mg Oral Daily Agbata, Tochukwu, MD   0.05 mg at 09/17/22 0853   HYDROcodone-acetaminophen (NORCO) 7.5-325 MG per tablet 1 tablet  1 tablet Oral Q8H PRN Agbata, Tochukwu, MD   1 tablet at 09/17/22 0647   hydrocortisone sodium succinate (SOLU-CORTEF) 100 MG injection 100 mg  100 mg Intravenous Q12H Agbata, Tochukwu, MD   100 mg at 09/16/22 2321   meloxicam (MOBIC) tablet 15 mg  15 mg Oral Daily Agbata, Tochukwu, MD   15 mg at 09/17/22 0855   ondansetron (ZOFRAN) tablet 4 mg  4 mg Oral Q6H PRN Agbata, Tochukwu, MD       Or   ondansetron (ZOFRAN) injection 4 mg  4 mg Intravenous Q6H PRN Agbata, Tochukwu, MD   4 mg at 09/17/22 0647   pantoprazole (PROTONIX) EC tablet 40 mg  40 mg Oral Daily Val Riles, MD   40 mg at 09/17/22 0855   [START ON 09/18/2022] Vitamin D (Ergocalciferol) (DRISDOL) 1.25 MG (50000 UNIT) capsule 50,000 Units  50,000 Units Oral Weekly Agbata, Tochukwu, MD         Objective: Vital signs in last 24 hours: Temp:  [97.6  F (36.4 C)-98.3 F (36.8 C)] 98.1 F (36.7 C) (02/17 0830) Pulse Rate:  [53-81] 60 (02/17 0830) Resp:  [15-17] 16 (02/17 0830) BP: (92-117)/(57-76) 104/64 (02/17 0830) SpO2:  [95 %-99 %] 98 % (02/17 0830) Weight:  [79.8 kg] 79.8 kg (02/16 1510)  Intake/Output from previous day: 02/16 0701 - 02/17 0700 In: 3386.4 [P.O.:120; I.V.:66.4; IV Piggyback:3200] Out: -  Intake/Output this shift: Total I/O In: 112.9 [I.V.:12.9; IV Piggyback:100] Out: -    Physical Exam: Alert, in no acute distress  Lab Results:  Recent Labs    09/16/22 0828 09/17/22 0508  WBC 20.6* 12.0*  HGB 16.6* 13.4  HCT 48.6* 40.5  PLT 182 163   BMET Recent Labs    09/16/22 0828 09/17/22 0508  NA 128* 136  K 4.3 4.6  CL 98 108  CO2 17* 23  GLUCOSE 122* 151*  BUN 23* 21*  CREATININE 0.98 0.72  CALCIUM 8.8* 8.9   PT/INR Recent Labs    09/17/22 0508  LABPROT 14.4  INR 1.1    Studies/Results: DG Abd 1  View  Result Date: 09/16/2022 CLINICAL DATA:  Right-sided pyelonephritis on recent CT examination. EXAM: ABDOMEN - 1 VIEW COMPARISON:  CT from earlier in the same day. FINDINGS: Scattered large and small bowel gas is noted. No obstructive changes are seen. No free air is noted. Contrast material is noted within the bladder as well as the collecting systems bilaterally. There is again minimal fullness of the right renal pelvis similar to that seen on prior CT. The known renal pelvic stone is not well appreciated on this exam. IMPRESSION: Mild fullness of the right renal pelvis similar to that seen on prior CT. Electronically Signed   By: Inez Catalina M.D.   On: 09/16/2022 18:23   CT Abdomen Pelvis W Contrast  Result Date: 09/16/2022 CLINICAL DATA:  Abdominal/flank pain, stone suspected. Fever with right flank and right lower quadrant pain. EXAM: CT ABDOMEN AND PELVIS WITH CONTRAST TECHNIQUE: Multidetector CT imaging of the abdomen and pelvis was performed using the standard protocol following bolus administration of intravenous contrast. RADIATION DOSE REDUCTION: This exam was performed according to the departmental dose-optimization program which includes automated exposure control, adjustment of the mA and/or kV according to patient size and/or use of iterative reconstruction technique. CONTRAST:  168m OMNIPAQUE IOHEXOL 300 MG/ML  SOLN COMPARISON:  CT abdomen/pelvis 05/03/2022. FINDINGS: Lower chest: No acute abnormality. Hepatobiliary: Focal fat along the falciform ligament. Prior cholecystectomy. No biliary dilatation. Pancreas: Unremarkable. No pancreatic ductal dilatation or surrounding inflammatory changes. Spleen: Normal. Adrenals/Urinary Tract: Atrophic adrenal glands. 3.5 mm nonobstructing calculus in the right renal pelvis. Mild right hydronephrosis with associated urothelial thickening and hyperenhancement extending along the pelvis and proximal ureter (image 37 series 2). Associated patchy  hypoenhancement of the left renal cortex (image 23 series 3), consistent with pyelonephritis. The distal ureters unremarkable. Bladder is decompressed. Normal left kidney. Stomach/Bowel: Normal stomach and duodenum. No dilated loops of small bowel. Normal appendix is visualized on sagittal image 48 series 7. Fluid-filled colon. Moderate distal descending and sigmoid diverticulosis without evidence of acute diverticulitis. Questionable mild mucosal hyperenhancement throughout the colon. No wall thickening or surrounding inflammation. Vascular/Lymphatic: No significant vascular findings are present. No enlarged abdominal or pelvic lymph nodes. Reproductive: Uterus and bilateral adnexa are unremarkable. Other: No abdominal wall hernia or abnormality. No abdominopelvic ascites. Musculoskeletal: L5 spondylolysis with grade 1 anterolisthesis of L5 on S1. No suspicious bone lesions. IMPRESSION: 1. Mild right hydronephrosis with associated urothelial thickening and  hyperenhancement extending along the right renal pelvis and proximal ureter. Associated patchy hypoenhancement of the right renal cortex consistent with pyelonephritis. 2. 3.5 mm nonobstructing calculus in the right renal pelvis. 3. Fluid-filled colon with questionable mild mucosal hyperenhancement throughout the colon. Correlate for diarrheal illness. 4. Moderate distal descending and sigmoid diverticulosis without evidence of acute diverticulitis. 5. L5 spondylolysis with grade 1 anterolisthesis of L5 on S1. Electronically Signed   By: Emmit Alexanders M.D.   On: 09/16/2022 09:50     Assessment: Feeling much better.  Urine culture with insignificant growth however clinical presentation, UA and imaging consistent with pyelonephritis.  KUB yesterday did show retention of contrast in the right renal pelvis however with her significant clinical improvement would not recommend stenting at this time  Recommendation Continue IV antibiotics.  If she remains  afebrile would change to oral antibiotics in a.m. Follow-up renal ultrasound in a.m.    LOS: 1 day    Abbie Sons 09/17/2022

## 2022-09-17 NOTE — Progress Notes (Signed)
[facility] Progress Note  Patient: Denise Burns    [id number]  DOA: 09/16/2022     Date of Service: the patient was seen and examined on 09/17/2022  Chief Complaint  Patient presents with   Fever   Back Pain   Brief hospital course: Denise Burns is a 52 y.o. female with medical history significant for nephrolithiasis, Addison's disease, GERD who presents to the emergency room via private vehicle for evaluation of fever. Patient states that she had right flank pain for 2 days.  Patient had fever Tmax 103.7, associate with nausea, vomiting and diarrhea.  Abnormal labs include sodium 128, bicarb 17, white count 20.6 Urine analysis is pending CT scan of abdomen and pelvis with contrast shows mild right hydronephrosis with associated urothelial thickening and hyperenhancement extending along the right renal pelvis and proximal ureter. Associated patchy hypoenhancement of the right renal cortex consistent with pyelonephritis. 3.5 mm nonobstructing calculus in the right renal pelvis. Fluid-filled colon with questionable mild mucosal hyper enhancement throughout the colon. Correlate for diarrheal illness. Moderate distal descending and sigmoid diverticulosis without evidence of acute diverticulitis. L5 spondylolysis with grade 1 anterolisthesis of L5 on S1. She received 3 L IV fluid bolus in the ER and a dose of Rocephin 1 g She will be admitted to the hospital for further evaluation.   Assessment and Plan:  # Sepsis secondary to UTI  As evidenced by fever with a Tmax of 101.7, tachycardia, tachypnea, marked leukocytosis and CT scan findings suggestive of right-sided pyelonephritis. S/p aggressive IV fluid resuscitation, continue oral hydration Continue Rocephin 2 g IV daily Follow-up results of blood NGTD F/u urine culture <10k insufficient growth   # Addison's disease ([place]) Will place patient on stress dose steroids due to acute infection Continue Florinef     Hyponatremia, resolved Most likely secondary to volume depletion from GI losses (nausea, vomiting and diarrhea) S/p  IV fluid resuscitation Repeat electrolytes in a.m. Na 128-136   Hydronephrosis of right kidney Imaging shows mild right hydronephrosis with associated urothelial thickening and hyperenhancement extending along the right renal pelvis and proximal ureter. Associated patchy hypoenhancement of the right renal cortex consistent with pyelonephritis.3.5 mm nonobstructing calculus in the right renal pelvis. consulted urology, who recommended no intervention, continue to biotics.   Chronic pain syndrome Continue hydrocodone   Generalized anxiety disorder Continue Lexapro   Body mass index is 30.2 kg/m.  Interventions:      Diet: Regular diet DVT Prophylaxis: Subcutaneous Lovenox   Advance goals of care discussion: Full code  Family Communication: family was not present at bedside, at the time of interview.  The pt provided permission to discuss medical plan with the family. Opportunity was given to ask question and all questions were answered satisfactorily.   Disposition:  Pt is from Home, admitted with right pyelonephritis, still urine cultures pending, which precludes a safe discharge. Discharge to home, when clinically stable, may need 1-2 more days.  Subjective: No significant events overnight, patient is having abdominal pain 1/10, much more improved, currently no nausea vomiting or diarrhea.  Denies any chest pain or palpitation, no shortness of breath.   Physical Exam: General: NAD, lying comfortably Appear in no distress, affect appropriate Eyes: PERRLA ENT: Oral Mucosa Clear, moist  Neck: no JVD,  Cardiovascular: S1 and S2 Present, no Murmur,  Respiratory: good respiratory effort, Bilateral Air entry equal and Decreased, no Crackles, no wheezes Abdomen: Bowel Sound present, Soft and no tenderness,  Skin: no rashes Extremities: no Pedal edema,  no  calf tenderness Neurologic: without any new focal findings Gait not checked due to patient safety concerns  Vitals:   09/16/22 1510 09/16/22 2006 09/17/22 0416 09/17/22 0830  BP: (!) 92/57 117/76 110/62 104/64  Pulse: 74 81 (!) 53 60  Resp: 15 17 17 16  $ Temp: 97.6 F (36.4 C) 98.3 F (36.8 C)  98.1 F (36.7 C)  TempSrc: Oral Oral    SpO2: 97% 99% 95% 98%  Weight: 79.8 kg     Height: 5' 4"$  (1.626 m)       Intake/Output Summary (Last 24 hours) at 09/17/2022 1129 Last data filed at 09/16/2022 2200 Gross per 24 hour  Intake 2286.44 ml  Output --  Net 2286.44 ml   Filed Weights   09/16/22 1510  Weight: 79.8 kg    Data Reviewed: I have personally reviewed and interpreted daily labs, tele strips, imagings as discussed above. I reviewed all nursing notes, pharmacy notes, vitals, pertinent old records I have discussed plan of care as described above with RN and patient/family.  CBC: Recent Labs  Lab 09/16/22 0828 09/17/22 0508  WBC 20.6* 12.0*  HGB 16.6* 13.4  HCT 48.6* 40.5  MCV 91.9 93.8  PLT 182 [id number]   Basic Metabolic Panel: Recent Labs  Lab 09/16/22 0828 09/17/22 0508  NA 128* 136  K 4.3 4.6  CL 98 108  CO2 17* 23  GLUCOSE 122* 151*  BUN 23* 21*  CREATININE 0.98 0.72  CALCIUM 8.8* 8.9    Studies: DG Abd 1 View  Result Date: 09/16/2022 CLINICAL DATA:  Right-sided pyelonephritis on recent CT examination. EXAM: ABDOMEN - 1 VIEW COMPARISON:  CT from earlier in the same day. FINDINGS: Scattered large and small bowel gas is noted. No obstructive changes are seen. No free air is noted. Contrast material is noted within the bladder as well as the collecting systems bilaterally. There is again minimal fullness of the right renal pelvis similar to that seen on prior CT. The known renal pelvic stone is not well appreciated on this exam. IMPRESSION: Mild fullness of the right renal pelvis similar to that seen on prior CT. Electronically Signed   By: Inez Catalina M.D.    On: 09/16/2022 18:23    Scheduled Meds:  enoxaparin (LOVENOX) injection  40 mg Subcutaneous Q24H   escitalopram  5 mg Oral Daily   fludrocortisone  0.05 mg Oral Daily   hydrocortisone sod succinate (SOLU-CORTEF) inj  100 mg Intravenous Q12H   meloxicam  15 mg Oral Daily   pantoprazole  40 mg Oral Daily   [START ON 09/18/2022] Vitamin D (Ergocalciferol)  50,000 Units Oral Weekly   Continuous Infusions:  sodium chloride 10 mL/hr at 09/17/22 0901   cefTRIAXone (ROCEPHIN)  IV 2 g (09/17/22 0904)   PRN Meds: sodium chloride, acetaminophen **OR** acetaminophen, alum & mag hydroxide-simeth, HYDROcodone-acetaminophen, ondansetron **OR** ondansetron (ZOFRAN) IV  Time spent: 35 minutes  Author: Val Riles. MD [facility] 09/17/2022 11:29 AM  To reach On-call, see care teams to locate the attending and reach out to them via [email or web address]. If 7PM-7AM, please contact night-coverage If you still have difficulty reaching the attending provider, please page the [facility] (Director on Call) for [facility] on amion for assistance.

## 2022-09-18 DIAGNOSIS — N390 Urinary tract infection, site not specified: Secondary | ICD-10-CM | POA: Diagnosis not present

## 2022-09-18 DIAGNOSIS — A419 Sepsis, unspecified organism: Secondary | ICD-10-CM | POA: Diagnosis not present

## 2022-09-18 LAB — CBC
HCT: 39.2 % (ref 36.0–46.0)
Hemoglobin: 13.1 g/dL (ref 12.0–15.0)
MCH: 31 pg (ref 26.0–34.0)
MCHC: 33.4 g/dL (ref 30.0–36.0)
MCV: 92.9 fL (ref 80.0–100.0)
Platelets: 174 10*3/uL (ref 150–400)
RBC: 4.22 MIL/uL (ref 3.87–5.11)
RDW: 12.3 % (ref 11.5–15.5)
WBC: 10.1 10*3/uL (ref 4.0–10.5)
nRBC: 0 % (ref 0.0–0.2)

## 2022-09-18 LAB — BASIC METABOLIC PANEL
Anion gap: 7 (ref 5–15)
BUN: 22 mg/dL — ABNORMAL HIGH (ref 6–20)
CO2: 25 mmol/L (ref 22–32)
Calcium: 8.7 mg/dL — ABNORMAL LOW (ref 8.9–10.3)
Chloride: 108 mmol/L (ref 98–111)
Creatinine, Ser: 0.72 mg/dL (ref 0.44–1.00)
GFR, Estimated: >60 mL/min (ref >60)
Glucose, Bld: 137 mg/dL — ABNORMAL HIGH (ref 70–99)
Potassium: 4.6 mmol/L (ref 3.5–5.1)
Sodium: 140 mmol/L (ref 135–145)

## 2022-09-18 LAB — MAGNESIUM: Magnesium: 2.2 mg/dL (ref 1.7–2.4)

## 2022-09-18 LAB — PHOSPHORUS: Phosphorus: 4.3 mg/dL (ref 2.5–4.6)

## 2022-09-18 MED ORDER — LORATADINE 10 MG PO TABS
10.0000 mg | ORAL_TABLET | Freq: Every day | ORAL | Status: DC | PRN
  Administered 2022-09-18: 10 mg via ORAL
  Filled 2022-09-18 (×2): qty 1

## 2022-09-18 MED ORDER — NITROFURANTOIN MONOHYD MACRO 100 MG PO CAPS: 100.0000 mg | ORAL_CAPSULE | Freq: Two times a day (BID) | ORAL | 0 refills | Status: AC

## 2022-09-18 NOTE — Plan of Care (Signed)

## 2022-09-18 NOTE — Plan of Care (Signed)
Patient is adequate for discharge from this facility to previous environment.  PIV removed, discharge instructions reviewed with patient.

## 2022-09-18 NOTE — Discharge Summary (Signed)
[facility] Discharge Summary   Patient: Denise Burns [id number]  PCP: [name], NP  Date of admission: 09/16/2022   Date of discharge:  09/18/2022     Discharge Diagnoses:  Principal Problem:   Sepsis secondary to UTI [facility]) Active Problems:   Addison's disease ([place])   Hyponatremia   Hydronephrosis of right kidney   Chronic pain syndrome   Generalized anxiety disorder   Admitted From: Home Disposition:  Home   Recommendations for Outpatient Follow-up:  Follow-up with PCP in 1 week Follow-up with urology in 1 week Follow up LABS/TEST:     Diet recommendation: Regular diet  Activity: The patient is advised to gradually reintroduce usual activities, as tolerated  Discharge Condition: stable  Code Status: Full code   History of present illness: As per the H and P dictated on admission Hospital Course:  Denise Burns is a 52 y.o. female with medical history significant for nephrolithiasis, Addison's disease, GERD who presents to the emergency room via private vehicle for evaluation of fever. Patient states that she had right flank pain for 2 days.  Patient had fever Tmax 103.7, associate with nausea, vomiting and diarrhea. Abnormal labs include sodium 128, bicarb 17, white count 20.6. UA positive CT scan of abdomen and pelvis with contrast shows mild right hydronephrosis with associated urothelial thickening and hyperenhancement extending along the right renal pelvis and proximal ureter. Associated patchy hypoenhancement of the right renal cortex consistent with pyelonephritis. 3.5 mm nonobstructing calculus in the right renal pelvis. Fluid-filled colon with questionable mild mucosal hyper enhancement throughout the colon. Correlate for diarrheal illness. Moderate distal descending and sigmoid diverticulosis without evidence of acute diverticulitis. L5 spondylolysis with grade 1 anterolisthesis of L5 on S1.  She received 3 L IV fluid bolus in the ER  and a dose of Rocephin 1 g Urology was consulted and patient was admitted for further management as below.   Assessment and Plan:   # Sepsis secondary to UTI  As evidenced by fever with a Tmax of 101.7, tachycardia, tachypnea, marked leukocytosis and CT scan findings suggestive of right-sided pyelonephritis. S/p aggressive IV fluid resuscitation, continue oral hydration. S/p Rocephin 2 g IV daily. blood culture NGTD. Urine culture <10k insufficient growth.  Patient was discharged on Macrobid 200 mg p.o. twice daily for 12 additional days to complete 2-week course. # Addison's disease: s/p stress dose steroid was given during hospital stay.  Resumed home dose on discharge.  Continued Florinef. # Hyponatremia, resolved. Most likely secondary to volume depletion from GI losses (nausea, vomiting and diarrhea), S/p  IV fluid resuscitation. Na 128--140 # Hydronephrosis of right kidney: Imaging shows mild right hydronephrosis with associated urothelial thickening and hyperenhancement extending along the right renal pelvis and proximal ureter. Associated patchy hypoenhancement of the right renal cortex consistent with pyelonephritis.3.5 mm nonobstructing calculus in the right renal pelvis. consulted urology, who recommended no intervention, continue to biotics. # Chronic pain syndrome: Continue hydrocodone # Generalized anxiety disorder: Continue Lexapro  Body mass index is 30.2 kg/m.  Nutrition Interventions:   - Patient was instructed, not to drive, operate heavy machinery, perform activities at heights, swimming or participation in water activities or provide baby sitting services while on Pain, Sleep and Anxiety Medications; until her outpatient Physician has advised to do so again.  - Also recommended to not to take more than prescribed Pain, Sleep and Anxiety Medications.  Patient was ambulatory without any assistance. On the day of the discharge the patient's vitals were stable,  and no other  acute medical condition were reported by patient. the patient was felt safe to be discharge at Home.  Consultants: Urology Procedures: None  Discharge Exam: General: Appear in no distress, no Rash; Oral Mucosa Clear, moist. Cardiovascular: S1 and S2 Present, no Murmur, Respiratory: normal respiratory effort, Bilateral Air entry present and no Crackles, no wheezes Abdomen: Bowel Sound present, Soft and no tenderness, no hernia Extremities: no Pedal edema, no calf tenderness Neurology: alert and oriented to time, place, and person affect appropriate.  Filed Weights   09/16/22 1510  Weight: 79.8 kg   Vitals:   09/18/22 0443 09/18/22 0811  BP: 114/82 116/69  Pulse: 65 62  Resp: 16 18  Temp: 98 F (36.7 C) 97.7 F (36.5 C)  SpO2: 99% 97%    DISCHARGE MEDICATION: Allergies as of 09/18/2022       Reactions   Sulfacetamide Sodium (acne)    Other reaction(s): sores   Tramadol Other (See Comments)   Other reaction(s): muscle tension   Lisinopril Rash, Cough   Sulfa Antibiotics Other (See Comments)   Blisters in mouth         Medication List     TAKE these medications    azelastine 0.1 % nasal spray Commonly known as: ASTELIN Place 1 spray into both nostrils 2 (two) times daily.   ergocalciferol 1.25 MG (50000 UT) capsule Commonly known as: Drisdol Take 1 capsule (50,000 Units total) by mouth once a week.   escitalopram 5 MG tablet Commonly known as: Lexapro Take 1 tablet (5 mg total) by mouth daily.   Flonase Sensimist 27.5 MCG/SPRAY nasal spray Generic drug: fluticasone Place 1 spray into the nose in the morning and at bedtime.   fludrocortisone 0.1 MG tablet Commonly known as: FLORINEF Take 0.5 tablets (0.05 mg total) by mouth daily.   HYDROcodone-acetaminophen 7.5-325 MG tablet Commonly known as: NORCO Take 1 tablet by mouth every 8 (eight) hours as needed for severe pain. Must last 30 days. What changed: Another medication with the same name was  removed. Continue taking this medication, and follow the directions you see here.   hydrocortisone 10 MG tablet Commonly known as: CORTEF Take 1.5 tablets (15 mg total) by mouth with breakfast and 1/2 tablet in the afternoon. (May double up during sick days)   meloxicam 15 MG tablet Commonly known as: MOBIC Take 1 tablet (15 mg total) by mouth daily.   naloxone 4 MG/0.1ML Liqd nasal spray kit Commonly known as: NARCAN Place 1 spray into the nose as needed for up to 365 doses (for opioid-induced respiratory depresssion). In case of emergency (overdose), spray once into each nostril. If no response within 3 minutes, repeat application and call [phone].   nitrofurantoin (macrocrystal-monohydrate) 100 MG capsule Commonly known as: MACROBID Take 1 capsule (100 mg total) by mouth 2 (two) times daily for 12 days.   omega-3 acid ethyl esters 1 g capsule Commonly known as: LOVAZA Take 1 g by mouth daily.   pseudoephedrine 30 MG tablet Commonly known as: SUDAFED Take 30 mg by mouth every 12 (twelve) hours as needed for congestion.   Urogesic-Blue 81.6 MG Tabs Take 1 tablet (81.6 mg total) by mouth 4 (four) times daily as needed.       Allergies  Allergen Reactions   Sulfacetamide Sodium (Acne)     Other reaction(s): sores   Tramadol Other (See Comments)    Other reaction(s): muscle tension   Lisinopril Rash and Cough   Sulfa Antibiotics Other (See  Comments)    Blisters in mouth    Discharge Instructions     Call MD for:  difficulty breathing, headache or visual disturbances   Complete by: As directed    Call MD for:  extreme fatigue   Complete by: As directed    Call MD for:  persistant dizziness or light-headedness   Complete by: As directed    Call MD for:  persistant nausea and vomiting   Complete by: As directed    Call MD for:  severe uncontrolled pain   Complete by: As directed    Call MD for:  temperature >100.4   Complete by: As directed    Diet - low sodium heart  healthy   Complete by: As directed    Discharge instructions   Complete by: As directed    Follow-up with PCP in 1 week Follow-up with urology in 1 week   Increase activity slowly   Complete by: As directed        The results of significant diagnostics from this hospitalization (including imaging, microbiology, ancillary and laboratory) are listed below for reference.    Significant Diagnostic Studies: US RENAL  Result Date: 09/17/2022 CLINICAL DATA:  Right hydronephrosis EXAM: RENAL / URINARY TRACT ULTRASOUND COMPLETE COMPARISON:  CT abdomen pelvis 09/16/2022 FINDINGS: Right Kidney: Renal measurements: 11.8 x 5.4 x 5.8 cm = volume: 194 mL. Echogenicity within normal limits. No mass or hydronephrosis visualized. Left Kidney: Renal measurements: 12.6 x 5.7 x 5.3 cm = volume: 199 mL. Echogenicity within normal limits. No mass or hydronephrosis visualized. Bladder: Appears normal for degree of bladder distention. Other: None. IMPRESSION: No hydronephrosis. No significant sonographic abnormality of the kidneys. Electronically Signed   By: Miachel Roux M.D.   On: 09/17/2022 17:32   DG Abd 1 View  Result Date: 09/16/2022 CLINICAL DATA:  Right-sided pyelonephritis on recent CT examination. EXAM: ABDOMEN - 1 VIEW COMPARISON:  CT from earlier in the same day. FINDINGS: Scattered large and small bowel gas is noted. No obstructive changes are seen. No free air is noted. Contrast material is noted within the bladder as well as the collecting systems bilaterally. There is again minimal fullness of the right renal pelvis similar to that seen on prior CT. The known renal pelvic stone is not well appreciated on this exam. IMPRESSION: Mild fullness of the right renal pelvis similar to that seen on prior CT. Electronically Signed   By: Inez Catalina M.D.   On: 09/16/2022 18:23   CT Abdomen Pelvis W Contrast  Result Date: 09/16/2022 CLINICAL DATA:  Abdominal/flank pain, stone suspected. Fever with right flank  and right lower quadrant pain. EXAM: CT ABDOMEN AND PELVIS WITH CONTRAST TECHNIQUE: Multidetector CT imaging of the abdomen and pelvis was performed using the standard protocol following bolus administration of intravenous contrast. RADIATION DOSE REDUCTION: This exam was performed according to the departmental dose-optimization program which includes automated exposure control, adjustment of the mA and/or kV according to patient size and/or use of iterative reconstruction technique. CONTRAST:  148m OMNIPAQUE IOHEXOL 300 MG/ML  SOLN COMPARISON:  CT abdomen/pelvis 05/03/2022. FINDINGS: Lower chest: No acute abnormality. Hepatobiliary: Focal fat along the falciform ligament. Prior cholecystectomy. No biliary dilatation. Pancreas: Unremarkable. No pancreatic ductal dilatation or surrounding inflammatory changes. Spleen: Normal. Adrenals/Urinary Tract: Atrophic adrenal glands. 3.5 mm nonobstructing calculus in the right renal pelvis. Mild right hydronephrosis with associated urothelial thickening and hyperenhancement extending along the pelvis and proximal ureter (image 37 series 2). Associated patchy hypoenhancement of the left  renal cortex (image 23 series 3), consistent with pyelonephritis. The distal ureters unremarkable. Bladder is decompressed. Normal left kidney. Stomach/Bowel: Normal stomach and duodenum. No dilated loops of small bowel. Normal appendix is visualized on sagittal image 48 series 7. Fluid-filled colon. Moderate distal descending and sigmoid diverticulosis without evidence of acute diverticulitis. Questionable mild mucosal hyperenhancement throughout the colon. No wall thickening or surrounding inflammation. Vascular/Lymphatic: No significant vascular findings are present. No enlarged abdominal or pelvic lymph nodes. Reproductive: Uterus and bilateral adnexa are unremarkable. Other: No abdominal wall hernia or abnormality. No abdominopelvic ascites. Musculoskeletal: L5 spondylolysis with grade 1  anterolisthesis of L5 on S1. No suspicious bone lesions. IMPRESSION: 1. Mild right hydronephrosis with associated urothelial thickening and hyperenhancement extending along the right renal pelvis and proximal ureter. Associated patchy hypoenhancement of the right renal cortex consistent with pyelonephritis. 2. 3.5 mm nonobstructing calculus in the right renal pelvis. 3. Fluid-filled colon with questionable mild mucosal hyperenhancement throughout the colon. Correlate for diarrheal illness. 4. Moderate distal descending and sigmoid diverticulosis without evidence of acute diverticulitis. 5. L5 spondylolysis with grade 1 anterolisthesis of L5 on S1. Electronically Signed   By: Emmit Alexanders M.D.   On: 09/16/2022 09:50   Abdomen 1 view (KUB)  Result Date: 09/15/2022 CLINICAL DATA:  Kidney stone follow-up EXAM: ABDOMEN - 1 VIEW COMPARISON:  CT renal stone 05/03/2022 FINDINGS: The bowel gas pattern is normal. No radio-opaque calculi identified. Cholecystectomy clips are present. There are degenerative changes at L5-S1. IMPRESSION: No radio-opaque calculi identified. Electronically Signed   By: Ronney Asters M.D.   On: 09/15/2022 21:43   DG PAIN CLINIC C-ARM 1-60 MIN NO REPORT  Result Date: 09/06/2022 Fluoro was used, but no Radiologist interpretation will be provided. Please refer to "NOTES" tab for provider progress note.  DG HIP UNILAT W OR W/O PELVIS 2-3 VIEWS RIGHT  Result Date: 08/24/2022 CLINICAL DATA:  Chronic bilateral hip pain. EXAM: DG HIP (WITH OR WITHOUT PELVIS) 2-3V LEFT; DG HIP (WITH OR WITHOUT PELVIS) 2-3V RIGHT COMPARISON:  CT abdomen and pelvis 05/03/2022, pelvis and right hip radiographs 02/11/2020 FINDINGS: The bilateral sacroiliac joint spaces are maintained. Mild pubic symphysis joint space narrowing. The bilateral femoroacetabular joint spaces are maintained. No acute fracture or dislocation. IMPRESSION: Mild pubic symphysis osteoarthritis. No significant degenerative changes of either  femoroacetabular joint. Electronically Signed   By: Yvonne Kendall M.D.   On: 08/24/2022 17:21   DG HIP UNILAT W OR W/O PELVIS 2-3 VIEWS LEFT  Result Date: 08/24/2022 CLINICAL DATA:  Chronic bilateral hip pain. EXAM: DG HIP (WITH OR WITHOUT PELVIS) 2-3V LEFT; DG HIP (WITH OR WITHOUT PELVIS) 2-3V RIGHT COMPARISON:  CT abdomen and pelvis 05/03/2022, pelvis and right hip radiographs 02/11/2020 FINDINGS: The bilateral sacroiliac joint spaces are maintained. Mild pubic symphysis joint space narrowing. The bilateral femoroacetabular joint spaces are maintained. No acute fracture or dislocation. IMPRESSION: Mild pubic symphysis osteoarthritis. No significant degenerative changes of either femoroacetabular joint. Electronically Signed   By: Yvonne Kendall M.D.   On: 08/24/2022 17:21    Microbiology: Recent Results (from the past 240 hour(s))  Resp panel by RT-PCR (RSV, Flu A&B, Covid) Anterior Nasal Swab     Status: None   Collection Time: 09/16/22  8:49 AM   Specimen: Anterior Nasal Swab  Result Value Ref Range Status   SARS Coronavirus 2 by RT PCR NEGATIVE NEGATIVE Final    Comment: (NOTE) SARS-CoV-2 target nucleic acids are NOT DETECTED.  The SARS-CoV-2 RNA is generally detectable in upper respiratory  specimens during the acute phase of infection. The lowest concentration of SARS-CoV-2 viral copies this assay can detect is 138 copies/mL. A negative result does not preclude SARS-Cov-2 infection and should not be used as the sole basis for treatment or other patient management decisions. A negative result may occur with  improper specimen collection/handling, submission of specimen other than nasopharyngeal swab, presence of viral mutation(s) within the areas targeted by this assay, and inadequate number of viral copies(<138 copies/mL). A negative result must be combined with clinical observations, patient history, and epidemiological information. The expected result is Negative.  Fact Sheet for  Patients:  EntrepreneurPulse.com.au  Fact Sheet for Healthcare Providers:  IncredibleEmployment.be  This test is no t yet approved or cleared by the [place] FDA and  has been authorized for detection and/or diagnosis of SARS-CoV-2 by FDA under an Emergency Use Authorization (EUA). This EUA will remain  in effect (meaning this test can be used) for the duration of the COVID-19 declaration under Section 564(b)(1) of the Act, 21 U.S.C.section 360bbb-3(b)(1), unless the authorization is terminated  or revoked sooner.       Influenza A by PCR NEGATIVE NEGATIVE Final   Influenza B by PCR NEGATIVE NEGATIVE Final    Comment: (NOTE) The Xpert Xpress SARS-CoV-2/FLU/RSV plus assay is intended as an aid in the diagnosis of influenza from Nasopharyngeal swab specimens and should not be used as a sole basis for treatment. Nasal washings and aspirates are unacceptable for Xpert Xpress SARS-CoV-2/FLU/RSV testing.  Fact Sheet for Patients: EntrepreneurPulse.com.au  Fact Sheet for Healthcare Providers: IncredibleEmployment.be  This test is not yet approved or cleared by the [place] FDA and has been authorized for detection and/or diagnosis of SARS-CoV-2 by FDA under an Emergency Use Authorization (EUA). This EUA will remain in effect (meaning this test can be used) for the duration of the COVID-19 declaration under Section 564(b)(1) of the Act, 21 U.S.C. section 360bbb-3(b)(1), unless the authorization is terminated or revoked.     Resp Syncytial Virus by PCR NEGATIVE NEGATIVE Final    Comment: (NOTE) Fact Sheet for Patients: EntrepreneurPulse.com.au  Fact Sheet for Healthcare Providers: IncredibleEmployment.be  This test is not yet approved or cleared by the [place] FDA and has been authorized for detection and/or diagnosis of SARS-CoV-2 by FDA under an Emergency  Use Authorization (EUA). This EUA will remain in effect (meaning this test can be used) for the duration of the COVID-19 declaration under Section 564(b)(1) of the Act, 21 U.S.C. section 360bbb-3(b)(1), unless the authorization is terminated or revoked.  Performed at [facility], [address] [place], [place] [address]   Blood culture (routine x 2)     Status: None (Preliminary result)   Collection Time: 09/16/22  9:19 AM   Specimen: BLOOD  Result Value Ref Range Status   Specimen Description BLOOD LEFT ANTECUBITAL  Final   Special Requests   Final    BOTTLES DRAWN AEROBIC AND ANAEROBIC Blood Culture results may not be optimal due to an excessive volume of blood received in culture bottles   Culture   Final    NO GROWTH 2 DAYS Performed at [facility], [address] [place], [place] [address]    Report Status PENDING  Incomplete  Blood culture (routine x 2)     Status: None (Preliminary result)   Collection Time: 09/16/22  9:19 AM   Specimen: BLOOD  Result Value Ref Range Status   Specimen Description BLOOD RIGHT ANTECUBITAL  Final   Special  Requests   Final    BOTTLES DRAWN AEROBIC AND ANAEROBIC Blood Culture adequate volume   Culture   Final    NO GROWTH 2 DAYS Performed at [facility], [address] [place], [place] [address]    Report Status PENDING  Incomplete  Urine Culture     Status: Abnormal   Collection Time: 09/16/22 10:30 AM   Specimen: Urine, Random  Result Value Ref Range Status   Specimen Description   Final    URINE, RANDOM Performed at [facility], [address], [place], [place] [address]    Special Requests   Final    NONE Performed at [facility], [address], [place], [place] [address]    Culture (A)  Final    <10,000 COLONIES/mL INSIGNIFICANT GROWTH Performed at [facility], [place] [address] [place], [place] [address]    Report Status 09/17/2022 FINAL  Final      Labs: CBC: Recent Labs  Lab 09/16/22 0828 09/17/22 0508 09/18/22 0524  WBC 20.6* 12.0* 10.1  HGB 16.6* 13.4 13.1  HCT 48.6* 40.5 39.2  MCV 91.9 93.8 92.9  PLT 182 163 [id number]   Basic Metabolic Panel: Recent Labs  Lab 09/16/22 0828 09/17/22 0508 09/18/22 0524  NA 128* 136 140  K 4.3 4.6 4.6  CL 98 108 108  CO2 17* 23 25  GLUCOSE 122* 151* 137*  BUN 23* 21* 22*  CREATININE 0.98 0.72 0.72  CALCIUM 8.8* 8.9 8.7*  MG  --   --  2.2  PHOS  --   --  4.3   Liver Function Tests: Recent Labs  Lab 09/16/22 0828  AST 36  ALT 36  ALKPHOS 63  BILITOT 2.0*  PROT 8.2*  ALBUMIN 4.1   Recent Labs  Lab 09/16/22 0828  LIPASE 38   No results for input(s): "AMMONIA" in the last 168 hours. Cardiac Enzymes: No results for input(s): "CKTOTAL", "CKMB", "CKMBINDEX", "TROPONINI" in the last 168 hours. BNP (last 3 results) No results for input(s): "BNP" in the last 8760 hours. CBG: No results for input(s): "GLUCAP" in the last 168 hours.  Time spent: 35 minutes  Signed:  Val Riles  [facility] 09/18/2022 11:12 AM

## 2022-09-19 ENCOUNTER — Telehealth: Payer: Self-pay

## 2022-09-19 NOTE — Transitions of Care (Post Inpatient/ED Visit) (Signed)
   09/19/2022  Name: Denise Burns MRN: [id number] DOB: October 25, 1970  Today's TOC FU Call Status: Today's TOC FU Call Status:: Successful TOC FU Call Competed TOC FU Call Complete Date: 09/19/22  Transition Care Management Follow-up Telephone Call Date of Discharge: 09/18/22 Discharge Facility: [facility]) Type of Discharge: Inpatient Admission Primary Inpatient Discharge Diagnosis:: Sepsis, Urinary Tract Infection How have you been since you were released from the hospital?: Better Any questions or concerns?: No  Items Reviewed: Did you receive and understand the discharge instructions provided?: Yes Medications obtained and verified?: Yes (Medications Reviewed) Any new allergies since your discharge?: No Dietary orders reviewed?: No Type of Diet Ordered:: Regular Do you have support at home?: Yes People in Home: spouse Name of Support/Comfort Primary Source: [place] and Equipment/Supplies: [place] Ordered?: No Any new equipment or medical supplies ordered?: No  Functional Questionnaire: Do you need assistance with bathing/showering or dressing?: No Do you need assistance with meal preparation?: No Do you need assistance with eating?: No Do you have difficulty maintaining continence: No Do you need assistance with getting out of bed/getting out of a chair/moving?: No Do you have difficulty managing or taking your medications?: No  Folllow up appointments reviewed: PCP Follow-up appointment confirmed?: No MD Provider Line Number:[phone] Given: Yes (Patient requested to call for appoinment) [facility] Follow-up appointment confirmed?: NA Do you need transportation to your follow-up appointment?: No Do you understand care options if your condition(s) worsen?: Yes-patient verbalized understanding  SDOH Interventions Today    Flowsheet Row Most Recent Value  SDOH Interventions   Food Insecurity  Interventions Intervention Not Indicated  Housing Interventions Intervention Not Indicated  Financial Strain Interventions Intervention Not Indicated      Johnney Killian, RN, BSN, CCM Care Management Coordinator [facility] Phone: [phone]: [phone]

## 2022-09-21 NOTE — Progress Notes (Unsigned)
PROVIDER NOTE: Information contained herein reflects review and annotations entered in association with encounter. Interpretation of such information and data should be left to medically-trained personnel. Information provided to patient can be located elsewhere in the medical record under "Patient Instructions". Document created using STT-dictation technology, any transcriptional errors that may result from process are unintentional.    Patient: Denise Burns  Service Category: E/M  Provider: [name], MD  DOB: 10-Apr-1971  DOS: 09/22/2022  Referring Provider: [name], NP  MRN: [id number]  Specialty: Interventional Pain Management  PCP: [name], NP  Type: Established Patient  Setting: Ambulatory outpatient    Location: Office  Delivery: Face-to-face     HPI  Denise Burns, a 52 y.o. year old female, is here today because of her Chronic hip pain, bilateral [M25.551, M25.552, G89.29]. Denise Burns's primary complain today is No chief complaint on file. Last encounter: My last encounter with her was on 09/06/2022. Pertinent problems: [name] has Chronic midline low back pain with bilateral sciatica; Chronic low back pain (1ry area of Pain) (Bilateral) (R>L) w/o sciatica; Chronic sacroiliac joint pain (2ry area of Pain) (Right); Chronic pain syndrome; Abnormal MRI, lumbar spine (03/22/2018); Lumbar facet hypertrophy (Multilevel) (Bilateral); Lumbar facet syndrome (Bilateral); DDD (degenerative disc disease), lumbosacral; Spondylolisthesis (8 mm) at L5-S1 level; Lumbar Grade 1 Retrolisthesis (2 mm) of L2/L3; L5-S1 pars defect w/ spondylolisthesis (Bilateral); Lumbar pars defect (L5-S1) (Bilateral); Lumbar foraminal stenosis (Left: L2-3) (Bilateral: L5-S1); Osteoarthritis of sacroiliac joint (Bilateral); Chronic musculoskeletal pain; Neurogenic pain; Osteoarthritis of facet joint of lumbar spine; Greater trochanteric bursitis (Bilateral); Lumbar spondylosis;  Spondylosis without myelopathy or radiculopathy, lumbosacral region; Chronic hip pain (Right); Osteoarthritis of hip (Right); Lumbar radiculitis (L1/L2) (Right); Chronic groin pain (Right); Chronic hip pain (Bilateral); Enthesopathy hip region (Left); Osteoarthritis of hips (Bilateral); Kissing spine syndrome; Chronic low back pain (Midline) w/o sciatica; and Trigger point with back pain (PSIS area) (Right) on their pertinent problem list. Pain Assessment: Severity of   is reported as a  /10. Location:    / . Onset:  . Quality:  . Timing:  . Modifying factor(s):  Marland Kitchen Vitals:  vitals were not taken for this visit.  BMI: Estimated body mass index is 30.2 kg/m as calculated from the following:   Height as of 09/16/22: 5' 4"$  (1.626 m).   Weight as of 09/16/22: 175 lb 14.8 oz (79.8 kg).  Reason for encounter: post-procedure evaluation and assessment. ***  Post-procedure evaluation   Type:  Intra-articular hip injection  #1  Laterality: Bilateral (-50)  Approach: Percutaneous posterolateral approach. Level: Lower pelvic and hip joint level.  Imaging: Fluoroscopy-guided         Anesthesia: Local anesthesia (1-2% Lidocaine) Anxiolysis: None                 Sedation: No Sedation                       DOS: 09/06/2022  Performed by: [name], MD  Purpose: Diagnostic/Therapeutic Indications: Hip pain severe enough to impact quality of life or function. Rationale (medical necessity): procedure needed and proper for the diagnosis and/or treatment of Denise Burns's medical symptoms and needs. 1. Chronic hip pain (Bilateral)   2. Osteoarthritis of hips (Bilateral)   3. Greater trochanteric bursitis (Bilateral)   4. Enthesopathy hip region (Left)    NAS-11 Pain score:   Pre-procedure: 6 /10   Post-procedure: 0-No pain/10  Effectiveness:  Initial hour after procedure:   ***. Subsequent 4-6 hours post-procedure:   ***. Analgesia past initial 6 hours:   ***. Ongoing improvement:   Analgesic:  *** Function:    ***    ROM:    ***      Pharmacotherapy Assessment  Analgesic: Hydrocodone/APAP 7.5/325 1 tablet p.o. 3 times daily (22.5 mg/day of hydrocodone)  MME/day: 22.5 mg/day.   Monitoring: [place] PMP: PDMP reviewed during this encounter.       Pharmacotherapy: No side-effects or adverse reactions reported. Compliance: No problems identified. Effectiveness: Clinically acceptable.  No notes on file  No results found for: "CBDTHCR" No results found for: "D8THCCBX" No results found for: "D9THCCBX"  UDS:  Summary  Date Value Ref Range Status  03/28/2022 Note  Final    Comment:    ==================================================================== [employer] [facility] (MW) ==================================================================== Test                             Result       Flag       Units  Drug Present and Declared for Prescription Verification   Hydrocodone                    2442         EXPECTED   ng/mg creat   Hydromorphone                  467          EXPECTED   ng/mg creat   Dihydrocodeine                 227          EXPECTED   ng/mg creat   Norhydrocodone                 2628         EXPECTED   ng/mg creat    Sources of hydrocodone include scheduled prescription medications.    Hydromorphone, dihydrocodeine and norhydrocodone are expected    metabolites of hydrocodone. Hydromorphone and dihydrocodeine are    also available as scheduled prescription medications.  ==================================================================== Test                      Result    Flag   Units      Ref Range   Creatinine              105              mg/dL      >=20 ==================================================================== Declared Medications:  The flagging and interpretation on this report are based on the  following declared medications.  Unexpected results may arise from  inaccuracies in the declared medications.   **Note: The  testing scope of this panel includes these medications:   Hydrocodone (Norco)   **Note: The testing scope of this panel does not include the  following reported medications:   Acetaminophen (Norco)  Azelastine (Astelin)  Fludrocortisone (Florinef)  Fluticasone (Flonase)  Hydrocortisone (Cortef)  Hyoscyamine  Meloxicam (Mobic)  Methenamine  Methylene Blue  Pseudoephedrine (Sudafed)  Sodium Biphosphate ==================================================================== For clinical consultation, please call [phone]. ====================================================================       ROS  Constitutional: Denies any fever or chills Gastrointestinal: No reported hemesis, hematochezia, vomiting, or acute GI distress Musculoskeletal: Denies any acute onset joint swelling, redness, loss of ROM, or weakness Neurological: No reported episodes of  acute onset apraxia, aphasia, dysarthria, agnosia, amnesia, paralysis, loss of coordination, or loss of consciousness  Medication Review  HYDROcodone-acetaminophen, Urogesic-Blue, azelastine, ergocalciferol, escitalopram, fludrocortisone, fluticasone, hydrocortisone, meloxicam, naloxone, nitrofurantoin (macrocrystal-monohydrate), omega-3 acid ethyl esters, and pseudoephedrine  History Review  Allergy: Denise Burns is allergic to sulfacetamide sodium (acne), tramadol, lisinopril, and sulfa antibiotics. Drug: Denise Burns  reports no history of drug use. Alcohol:  reports that she does not currently use alcohol. Tobacco:  reports that she quit smoking about 28 years ago. Her smoking use included cigarettes. She has never used smokeless tobacco. Social: Denise Burns  reports that she quit smoking about 28 years ago. Her smoking use included cigarettes. She has never used smokeless tobacco. She reports that she does not currently use alcohol. She reports that she does not use drugs. Medical:  has a past medical history of Addison  disease ([place]), Arthritis, Back pain, Complication of anesthesia, GERD (gastroesophageal reflux disease), and History of kidney stones. Surgical: Denise Burns  has a past surgical history that includes Cholecystectomy (2010); Polypectomy; dental work; Dilation and curettage of uterus (2010); IR NEPHROSTOMY PLACEMENT LEFT (12/14/2020); Nephrolithotomy (Left, 12/14/2020); Wisdom tooth extraction; Diagnostic laparoscopy (2004); Cystoscopy/ureteroscopy/holmium laser/stent placement (Right, 06/28/2021); Cystoscopy w/ ureteral stent removal (Right, 08/09/2021); Cystoscopy w/ retrogrades (Right, 08/09/2021); Cystoscopy/ureteroscopy/holmium laser/stent placement (Right, 08/09/2021); and Colonoscopy with propofol (N/A, 06/08/2022). Family: family history includes Alcohol abuse in her father; Arthritis in her mother; Brain cancer in her mother; Colon polyps (age of onset: 68) in her maternal grandmother; Hearing loss in her father; High blood pressure in her father and mother; Hyperlipidemia in her father and mother; Hypertension in her mother; Liver cancer in her mother; Lung cancer in her mother.  Laboratory Chemistry Profile   Renal Lab Results  Component Value Date   BUN 22 (H) 09/18/2022   CREATININE 0.72 09/18/2022   BCR 15 06/15/2022   GFR 84.36 08/26/2021   GFRAA 86 08/16/2018   GFRNONAA >60 09/18/2022    Hepatic Lab Results  Component Value Date   AST 36 09/16/2022   ALT 36 09/16/2022   ALBUMIN 4.1 09/16/2022   ALKPHOS 63 09/16/2022   LIPASE 38 09/16/2022    Electrolytes Lab Results  Component Value Date   NA 140 09/18/2022   K 4.6 09/18/2022   CL 108 09/18/2022   CALCIUM 8.7 (L) 09/18/2022   MG 2.2 09/18/2022   PHOS 4.3 09/18/2022    Bone Lab Results  Component Value Date   VD25OH 14.0 (L) 06/15/2022   25OHVITD1 67 08/16/2018   25OHVITD2 <1.0 08/16/2018   25OHVITD3 67 08/16/2018   TESTOSTERONE 1 (L) 08/13/2020    Inflammation (CRP: Acute Phase) (ESR: Chronic Phase) Lab Results   Component Value Date   CRP 2 08/16/2018   ESRSEDRATE 37 (H) 08/16/2018   LATICACIDVEN 1.9 09/16/2022         Note: Above Lab results reviewed.  Recent Imaging Review  US RENAL CLINICAL DATA:  Right hydronephrosis  EXAM: RENAL / URINARY TRACT ULTRASOUND COMPLETE  COMPARISON:  CT abdomen pelvis 09/16/2022  FINDINGS: Right Kidney:  Renal measurements: 11.8 x 5.4 x 5.8 cm = volume: 194 mL. Echogenicity within normal limits. No mass or hydronephrosis visualized.  Left Kidney:  Renal measurements: 12.6 x 5.7 x 5.3 cm = volume: 199 mL. Echogenicity within normal limits. No mass or hydronephrosis visualized.  Bladder:  Appears normal for degree of bladder distention.  Other:  None.  IMPRESSION: No hydronephrosis. No significant sonographic abnormality of the kidneys.  Electronically Signed  By: Sharen Heck  Mir M.D.   On: 09/17/2022 17:32 Note: Reviewed        Physical Exam  General appearance: Well nourished, well developed, and well hydrated. In no apparent acute distress Mental status: Alert, oriented x 3 (person, place, & time)       Respiratory: No evidence of acute respiratory distress Eyes: PERLA Vitals: LMP 12/01/2020 (Approximate)  BMI: Estimated body mass index is 30.2 kg/m as calculated from the following:   Height as of 09/16/22: 5' 4"$  (1.626 m).   Weight as of 09/16/22: 175 lb 14.8 oz (79.8 kg). Ideal: Ideal body weight: 54.7 kg (120 lb 9.5 oz) Adjusted ideal body weight: 64.7 kg (142 lb 11.6 oz)  Assessment   Diagnosis Status  1. Chronic hip pain (Bilateral)   2. Greater trochanteric bursitis (Bilateral)   3. Chronic pain syndrome    Controlled Controlled Controlled   Updated Problems: No problems updated.  Plan of Care  Problem-specific:  No problem-specific Assessment & Plan notes found for this encounter.  Denise Burns has a current medication list which includes the following long-term medication(s): azelastine,  escitalopram, flonase sensimist, hydrocodone-acetaminophen, meloxicam, and omega-3 acid ethyl esters.  Pharmacotherapy (Medications Ordered): No orders of the defined types were placed in this encounter.  Orders:  No orders of the defined types were placed in this encounter.  Follow-up plan:   No follow-ups on file.      Interventional Therapies  Risk  Complexity Considerations:   WNL   Planned  Pending:   Therapeutic bilateral IA hip injection (posterolateral approach) R3L2    Under consideration:   Therapeutic bilateral IA hip injection (posterolateral approach) R3L2    Completed:   Therapeutic bilateral lumbar facet MBB x3 (09/08/2020) (4-0) (100/100/60/60)  Therapeutic right lumbar facet RFA x2 (09/09/2021) (4-2) (100/100/90/>75)  Therapeutic left lumbar facet RFA x2 (10/07/2021) (4-0) (100/100/90/>75)  Therapeutic right IA hip inj. x2 (08/20/2020) (5-1) (100/100/80/80)  Therapeutic left IA hip inj. x1 (08/20/2020) (5-1) (100/100/80/80)  Therapeutic bilateral trochanteric bursa inj. x3 (04/07/2022)  (100/100/90/90)    Therapeutic  Palliative (PRN) options:   Palliative lumbar facet MBB   Palliative lumbar facet RFA  Palliative trochanteric bursa inj.    Pharmacotherapy:  Nonopioids transferred 05/11/2020: Baclofen and Mobic Recommendations:   None at this time.         Recent Visits Date Type Provider Dept  09/06/22 Procedure visit Milinda Pointer, MD [facility]  08/24/22 Office Visit Milinda Pointer, MD [facility]  06/29/22 Office Visit Milinda Pointer, MD [facility]  Showing recent visits within past 90 days and meeting all other requirements Future Appointments Date Type Provider Dept  09/22/22 Appointment Milinda Pointer, [facility]  09/26/22 Appointment Milinda Pointer, MD [facility]  Showing future appointments within next 90 days and meeting all other requirements  I discussed  the assessment and treatment plan with the patient. The patient was provided an opportunity to ask questions and all were answered. The patient agreed with the plan and demonstrated an understanding of the instructions.  Patient advised to call back or seek an in-person evaluation if the symptoms or condition worsens.  Duration of encounter: *** minutes.  Total time on encounter, as per AMA guidelines included both the face-to-face and non-face-to-face time personally spent by the physician and/or other qualified health care professional(s) on the day of the encounter (includes time in activities that require the physician or other qualified health care professional and does not  include time in activities normally performed by clinical staff). Physician's time may include the following activities when performed: Preparing to see the patient (e.g., pre-charting review of records, searching for previously ordered imaging, lab work, and nerve conduction tests) Review of prior analgesic pharmacotherapies. Reviewing PMP Interpreting ordered tests (e.g., lab work, imaging, nerve conduction tests) Performing post-procedure evaluations, including interpretation of diagnostic procedures Obtaining and/or reviewing separately obtained history Performing a medically appropriate examination and/or evaluation Counseling and educating the patient/family/caregiver Ordering medications, tests, or procedures Referring and communicating with other health care professionals (when not separately reported) Documenting clinical information in the electronic or other health record Independently interpreting results (not separately reported) and communicating results to the patient/ family/caregiver Care coordination (not separately reported)  Note by: [name], MD Date: 09/22/2022; Time: 7:55 AM

## 2022-09-22 ENCOUNTER — Ambulatory Visit: Payer: BC Managed Care – PPO | Attending: Pain Medicine | Admitting: Pain Medicine

## 2022-09-22 ENCOUNTER — Encounter: Payer: Self-pay | Admitting: Pain Medicine

## 2022-09-22 VITALS — BP 164/88 | HR 92 | Temp 97.9°F | Resp 16 | Ht 64.0 in | Wt 175.0 lb

## 2022-09-22 DIAGNOSIS — M7062 Trochanteric bursitis, left hip: Secondary | ICD-10-CM | POA: Diagnosis not present

## 2022-09-22 DIAGNOSIS — M7061 Trochanteric bursitis, right hip: Secondary | ICD-10-CM

## 2022-09-22 DIAGNOSIS — M25551 Pain in right hip: Secondary | ICD-10-CM

## 2022-09-22 DIAGNOSIS — M25552 Pain in left hip: Secondary | ICD-10-CM | POA: Diagnosis not present

## 2022-09-22 DIAGNOSIS — G894 Chronic pain syndrome: Secondary | ICD-10-CM | POA: Insufficient documentation

## 2022-09-22 DIAGNOSIS — G8929 Other chronic pain: Secondary | ICD-10-CM | POA: Diagnosis not present

## 2022-09-22 LAB — CULTURE, BLOOD (ROUTINE X 2)
Culture: NO GROWTH
Culture: NO GROWTH
Special Requests: ADEQUATE

## 2022-09-23 ENCOUNTER — Other Ambulatory Visit: Payer: Self-pay

## 2022-09-23 ENCOUNTER — Other Ambulatory Visit: Payer: Self-pay | Admitting: Nurse Practitioner

## 2022-09-23 DIAGNOSIS — M47816 Spondylosis without myelopathy or radiculopathy, lumbar region: Secondary | ICD-10-CM

## 2022-09-23 MED ORDER — MELOXICAM 15 MG PO TABS
15.0000 mg | ORAL_TABLET | Freq: Every day | ORAL | 0 refills | Status: DC
  Filled 2022-09-23: qty 30, 30d supply, fill #0

## 2022-09-23 NOTE — Telephone Encounter (Signed)
L.O.V: 06/22/22  N.O.V: not scheduled   L.R.F: 06/13/22 Meloxicam 90 tab 0 refill.   OV required

## 2022-09-23 NOTE — Telephone Encounter (Signed)
Pt calling back to schedule appointment for hospital follow up and med refill.  Pt would like to have refill sent to last until appt.        meloxicam (MOBIC) 15 MG tablet    [facility]  [address] [place] [address]

## 2022-09-23 NOTE — Telephone Encounter (Signed)
30 day sent

## 2022-09-26 ENCOUNTER — Ambulatory Visit: Payer: BC Managed Care – PPO | Attending: Pain Medicine | Admitting: Pain Medicine

## 2022-09-26 ENCOUNTER — Encounter: Payer: Self-pay | Admitting: Pain Medicine

## 2022-09-26 VITALS — BP 153/90 | HR 106 | Temp 98.1°F | Resp 16 | Ht 64.0 in | Wt 175.0 lb

## 2022-09-26 DIAGNOSIS — M533 Sacrococcygeal disorders, not elsewhere classified: Secondary | ICD-10-CM | POA: Insufficient documentation

## 2022-09-26 DIAGNOSIS — Z79891 Long term (current) use of opiate analgesic: Secondary | ICD-10-CM

## 2022-09-26 DIAGNOSIS — M47816 Spondylosis without myelopathy or radiculopathy, lumbar region: Secondary | ICD-10-CM | POA: Diagnosis not present

## 2022-09-26 DIAGNOSIS — Z79899 Other long term (current) drug therapy: Secondary | ICD-10-CM | POA: Insufficient documentation

## 2022-09-26 DIAGNOSIS — G8929 Other chronic pain: Secondary | ICD-10-CM | POA: Insufficient documentation

## 2022-09-26 DIAGNOSIS — M25551 Pain in right hip: Secondary | ICD-10-CM

## 2022-09-26 DIAGNOSIS — M25552 Pain in left hip: Secondary | ICD-10-CM | POA: Insufficient documentation

## 2022-09-26 DIAGNOSIS — M2559 Pain in other specified joint: Secondary | ICD-10-CM | POA: Diagnosis not present

## 2022-09-26 DIAGNOSIS — M545 Low back pain, unspecified: Secondary | ICD-10-CM | POA: Diagnosis not present

## 2022-09-26 DIAGNOSIS — G894 Chronic pain syndrome: Secondary | ICD-10-CM

## 2022-09-26 MED ORDER — HYDROCODONE-ACETAMINOPHEN 7.5-325 MG PO TABS: 1.0000 | ORAL_TABLET | Freq: Three times a day (TID) | ORAL | 0 refills | Status: DC | PRN

## 2022-09-26 NOTE — Progress Notes (Signed)
Nursing Pain Medication Assessment:  Safety precautions to be maintained throughout the outpatient stay will include: orient to surroundings, keep bed in low position, maintain call bell within reach at all times, provide assistance with transfer out of bed and ambulation.  Medication Inspection Compliance: Pill count conducted under aseptic conditions, in front of the patient. Neither the pills nor the bottle was removed from the patient's sight at any time. Once count was completed pills were immediately returned to the patient in their original bottle.  Medication: Hydrocodone/APAP Pill/Patch Count:  16 of 90 pills remain Pill/Patch Appearance: Markings consistent with prescribed medication Bottle Appearance: Standard pharmacy container. Clearly labeled. Filled Date: 02 / 04 / 2024 Last Medication intake:  Today

## 2022-09-26 NOTE — Progress Notes (Signed)
PROVIDER NOTE: Information contained herein reflects review and annotations entered in association with encounter. Interpretation of such information and data should be left to medically-trained personnel. Information provided to patient can be located elsewhere in the medical record under "Patient Instructions". Document created using STT-dictation technology, any transcriptional errors that may result from process are unintentional.    Patient: Denise Burns  Service Category: E/M  Provider: [name], MD  DOB: 1970-10-14  DOS: 09/26/2022  Referring Provider: [name], NP  MRN: [id number]  Specialty: Interventional Pain Management  PCP: [name], NP  Type: Established Patient  Setting: Ambulatory outpatient    Location: Office  Delivery: Face-to-face     HPI  Ms. Diary Latner, a 52 y.o. year old female, is here today because of her Chronic pain syndrome [G89.4]. Ms. Ibrahim's primary complain today is Back Pain (Lumbar bilateral ) and Hip Pain (Bilateral ) Last encounter: My last encounter with her was on 09/22/2022. Pertinent problems: Ms. Depaola has Chronic midline low back pain with bilateral sciatica; Chronic low back pain (1ry area of Pain) (Bilateral) (R>L) w/o sciatica; Chronic sacroiliac joint pain (2ry area of Pain) (Right); Chronic pain syndrome; Abnormal MRI, lumbar spine (03/22/2018); Lumbar facet hypertrophy (Multilevel) (Bilateral); Lumbar facet syndrome (Bilateral); DDD (degenerative disc disease), lumbosacral; Spondylolisthesis (8 mm) at L5-S1 level; Lumbar Grade 1 Retrolisthesis (2 mm) of L2/L3; L5-S1 pars defect w/ spondylolisthesis (Bilateral); Lumbar pars defect (L5-S1) (Bilateral); Lumbar foraminal stenosis (Left: L2-3) (Bilateral: L5-S1); Osteoarthritis of sacroiliac joint (Bilateral); Chronic musculoskeletal pain; Neurogenic pain; Osteoarthritis of facet joint of lumbar spine; Greater trochanteric bursitis (Bilateral); Lumbar spondylosis;  Spondylosis without myelopathy or radiculopathy, lumbosacral region; Chronic hip pain (Right); Osteoarthritis of hip (Right); Lumbar radiculitis (L1/L2) (Right); Chronic groin pain (Right); Chronic hip pain (Bilateral); Enthesopathy hip region (Left); Osteoarthritis of hips (Bilateral); Kissing spine syndrome; Chronic low back pain (Midline) w/o sciatica; and Trigger point with back pain (PSIS area) (Right) on their pertinent problem list. Pain Assessment: Severity of Chronic pain is reported as a 3 /10. Location: Back (bilateral hips) Left, Right/denies. Onset: More than a month ago. Quality: Discomfort, Aching. Timing: Intermittent. Modifying factor(s): heat, rest, medications and injections. Vitals:  height is '5\' 4"'$  (1.626 m) and weight is 175 lb (79.4 kg). Her temporal temperature is 98.1 F (36.7 C). Her blood pressure is 153/90 (abnormal) and her pulse is 106 (abnormal). Her respiration is 16 and oxygen saturation is 97%.  BMI: Estimated body mass index is 30.04 kg/m as calculated from the following:   Height as of this encounter: '5\' 4"'$  (1.626 m).   Weight as of this encounter: 175 lb (79.4 kg).  Reason for encounter: medication management.  The patient indicates doing well with the current medication regimen. No adverse reactions or side effects reported to the medications.   RTCB: 12/13/2022   Pharmacotherapy Assessment  Analgesic: Hydrocodone/APAP 7.5/325 1 tablet p.o. 3 times daily (22.5 mg/day of hydrocodone)  MME/day: 22.5 mg/day.   Monitoring: [place] PMP: PDMP reviewed during this encounter.       Pharmacotherapy: No side-effects or adverse reactions reported. Compliance: No problems identified. Effectiveness: Clinically acceptable.  Janett Billow, RN  09/26/2022  3:02 PM  Sign when Signing Visit Nursing Pain Medication Assessment:  Safety precautions to be maintained throughout the outpatient stay will include: orient to surroundings, keep bed in low position, maintain call  bell within reach at all times, provide assistance with transfer out of bed and ambulation.  Medication Inspection Compliance: Pill  count conducted under aseptic conditions, in front of the patient. Neither the pills nor the bottle was removed from the patient's sight at any time. Once count was completed pills were immediately returned to the patient in their original bottle.  Medication: Hydrocodone/APAP Pill/Patch Count:  16 of 90 pills remain Pill/Patch Appearance: Markings consistent with prescribed medication Bottle Appearance: Standard pharmacy container. Clearly labeled. Filled Date: 02 / 04 / 2024 Last Medication intake:  Today    No results found for: "CBDTHCR" No results found for: "D8THCCBX" No results found for: "D9THCCBX"  UDS:  Summary  Date Value Ref Range Status  03/28/2022 Note  Final    Comment:    ==================================================================== [employer] (MW) ==================================================================== Test                             Result       Flag       Units  Drug Present and Declared for Prescription Verification   Hydrocodone                    2442         EXPECTED   ng/mg creat   Hydromorphone                  467          EXPECTED   ng/mg creat   Dihydrocodeine                 227          EXPECTED   ng/mg creat   Norhydrocodone                 2628         EXPECTED   ng/mg creat    Sources of hydrocodone include scheduled prescription medications.    Hydromorphone, dihydrocodeine and norhydrocodone are expected    metabolites of hydrocodone. Hydromorphone and dihydrocodeine are    also available as scheduled prescription medications.  ==================================================================== Test                      Result    Flag   Units      Ref Range   Creatinine              105              mg/dL       >=20 ==================================================================== Declared Medications:  The flagging and interpretation on this report are based on the  following declared medications.  Unexpected results may arise from  inaccuracies in the declared medications.   **Note: The testing scope of this panel includes these medications:   Hydrocodone (Norco)   **Note: The testing scope of this panel does not include the  following reported medications:   Acetaminophen (Norco)  Azelastine (Astelin)  Fludrocortisone (Florinef)  Fluticasone (Flonase)  Hydrocortisone (Cortef)  Hyoscyamine  Meloxicam (Mobic)  Methenamine  Methylene Blue  Pseudoephedrine (Sudafed)  Sodium Biphosphate ==================================================================== For clinical consultation, please call [phone]. ====================================================================       ROS  Constitutional: Denies any fever or chills Gastrointestinal: No reported hemesis, hematochezia, vomiting, or acute GI distress Musculoskeletal: Denies any acute onset joint swelling, redness, loss of ROM, or weakness Neurological: No reported episodes of acute onset apraxia, aphasia, dysarthria, agnosia, amnesia, paralysis, loss of coordination, or loss of consciousness  Medication Review  HYDROcodone-acetaminophen, Urogesic-Blue, azelastine, ergocalciferol, escitalopram, fludrocortisone,  fluticasone, hydrocortisone, meloxicam, naloxone, nitrofurantoin (macrocrystal-monohydrate), omega-3 acid ethyl esters, and pseudoephedrine  History Review  Allergy: Ms. Guo is allergic to sulfacetamide sodium (acne), tramadol, lisinopril, and sulfa antibiotics. Drug: Ms. Ortez  reports no history of drug use. Alcohol:  reports that she does not currently use alcohol. Tobacco:  reports that she quit smoking about 28 years ago. Her smoking use included cigarettes. She has never used smokeless  tobacco. Social: Ms. Mershon  reports that she quit smoking about 28 years ago. Her smoking use included cigarettes. She has never used smokeless tobacco. She reports that she does not currently use alcohol. She reports that she does not use drugs. Medical:  has a past medical history of Addison disease ([place]), Arthritis, Back pain, Complication of anesthesia, GERD (gastroesophageal reflux disease), and History of kidney stones. Surgical: [name]  has a past surgical history that includes Cholecystectomy (2010); Polypectomy; dental work; Dilation and curettage of uterus (2010); IR NEPHROSTOMY PLACEMENT LEFT (12/14/2020); Nephrolithotomy (Left, 12/14/2020); Wisdom tooth extraction; Diagnostic laparoscopy (2004); Cystoscopy/ureteroscopy/holmium laser/stent placement (Right, 06/28/2021); Cystoscopy w/ ureteral stent removal (Right, 08/09/2021); Cystoscopy w/ retrogrades (Right, 08/09/2021); Cystoscopy/ureteroscopy/holmium laser/stent placement (Right, 08/09/2021); and Colonoscopy with propofol (N/A, 06/08/2022). Family: family history includes Alcohol abuse in her father; Arthritis in her mother; Brain cancer in her mother; Colon polyps (age of onset: 86) in her maternal grandmother; Hearing loss in her father; High blood pressure in her father and mother; Hyperlipidemia in her father and mother; Hypertension in her mother; Liver cancer in her mother; Lung cancer in her mother.  Laboratory Chemistry Profile   Renal Lab Results  Component Value Date   BUN 22 (H) 09/18/2022   CREATININE 0.72 09/18/2022   BCR 15 06/15/2022   GFR 84.36 08/26/2021   GFRAA 86 08/16/2018   GFRNONAA >60 09/18/2022    Hepatic Lab Results  Component Value Date   AST 36 09/16/2022   ALT 36 09/16/2022   ALBUMIN 4.1 09/16/2022   ALKPHOS 63 09/16/2022   LIPASE 38 09/16/2022    Electrolytes Lab Results  Component Value Date   NA 140 09/18/2022   K 4.6 09/18/2022   CL 108 09/18/2022   CALCIUM 8.7 (L) 09/18/2022   MG 2.2  09/18/2022   PHOS 4.3 09/18/2022    Bone Lab Results  Component Value Date   VD25OH 14.0 (L) 06/15/2022   25OHVITD1 67 08/16/2018   25OHVITD2 <1.0 08/16/2018   25OHVITD3 67 08/16/2018   TESTOSTERONE 1 (L) 08/13/2020    Inflammation (CRP: Acute Phase) (ESR: Chronic Phase) Lab Results  Component Value Date   CRP 2 08/16/2018   ESRSEDRATE 37 (H) 08/16/2018   LATICACIDVEN 1.9 09/16/2022         Note: Above Lab results reviewed.  Recent Imaging Review  US RENAL CLINICAL DATA:  Right hydronephrosis  EXAM: RENAL / URINARY TRACT ULTRASOUND COMPLETE  COMPARISON:  CT abdomen pelvis 09/16/2022  FINDINGS: Right Kidney:  Renal measurements: 11.8 x 5.4 x 5.8 cm = volume: 194 mL. Echogenicity within normal limits. No mass or hydronephrosis visualized.  Left Kidney:  Renal measurements: 12.6 x 5.7 x 5.3 cm = volume: 199 mL. Echogenicity within normal limits. No mass or hydronephrosis visualized.  Bladder:  Appears normal for degree of bladder distention.  Other:  None.  IMPRESSION: No hydronephrosis. No significant sonographic abnormality of the kidneys.  Electronically Signed   By: Miachel Roux M.D.   On: 09/17/2022 17:32 Note: Reviewed        Physical Exam  General appearance:  Well nourished, well developed, and well hydrated. In no apparent acute distress Mental status: Alert, oriented x 3 (person, place, & time)       Respiratory: No evidence of acute respiratory distress Eyes: PERLA Vitals: BP (!) 153/90 (BP Location: Right Arm, Patient Position: Sitting, Cuff Size: Normal)   Pulse (!) 106   Temp 98.1 F (36.7 C) (Temporal)   Resp 16   Ht '5\' 4"'$  (1.626 m)   Wt 175 lb (79.4 kg)   LMP 12/01/2020 (Approximate)   SpO2 97%   BMI 30.04 kg/m  BMI: Estimated body mass index is 30.04 kg/m as calculated from the following:   Height as of this encounter: '5\' 4"'$  (1.626 m).   Weight as of this encounter: 175 lb (79.4 kg). Ideal: Ideal body weight: 54.7 kg (120  lb 9.5 oz) Adjusted ideal body weight: 64.6 kg (142 lb 5.7 oz)  Assessment   Diagnosis Status  1. Chronic pain syndrome   2. Chronic low back pain (1ry area of Pain) (Bilateral) (R>L) w/o sciatica   3. Chronic sacroiliac joint pain (2ry area of Pain) (Right)   4. Lumbar facet syndrome (Bilateral)   5. Chronic hip pain (Bilateral)   6. Pharmacologic therapy   7. Chronic use of opiate for therapeutic purpose   8. Encounter for medication management   9. Encounter for chronic pain management    Controlled Controlled Controlled   Updated Problems: No problems updated.  Plan of Care  Problem-specific:  No problem-specific Assessment & Plan notes found for this encounter.  Ms. Aleysa Mayse has a current medication list which includes the following long-term medication(s): azelastine, escitalopram, flonase sensimist, meloxicam, omega-3 acid ethyl esters, [START ON 09/30/2022] hydrocodone-acetaminophen, [START ON 10/30/2022] hydrocodone-acetaminophen, and [START ON 11/29/2022] hydrocodone-acetaminophen.  Pharmacotherapy (Medications Ordered): Meds ordered this encounter  Medications   HYDROcodone-acetaminophen (NORCO) 7.5-325 MG tablet    Sig: Take 1 tablet by mouth every 8 (eight) hours as needed for severe pain. Must last 30 days.    Dispense:  90 tablet    Refill:  0    DO NOT: delete (not duplicate); no partial-fill (will deny script to complete), no refill request (F/U required). DISPENSE: 1 day early if closed on fill date. WARN: No CNS-depressants within 8 hrs of med.   HYDROcodone-acetaminophen (NORCO) 7.5-325 MG tablet    Sig: Take 1 tablet by mouth every 8 (eight) hours as needed for severe pain. Must last 30 days.    Dispense:  90 tablet    Refill:  0    DO NOT: delete (not duplicate); no partial-fill (will deny script to complete), no refill request (F/U required). DISPENSE: 1 day early if closed on fill date. WARN: No CNS-depressants within 8 hrs of med.    HYDROcodone-acetaminophen (NORCO) 7.5-325 MG tablet    Sig: Take 1 tablet by mouth every 8 (eight) hours as needed for severe pain. Must last 30 days.    Dispense:  90 tablet    Refill:  0    DO NOT: delete (not duplicate); no partial-fill (will deny script to complete), no refill request (F/U required). DISPENSE: 1 day early if closed on fill date. WARN: No CNS-depressants within 8 hrs of med.   Orders:  No orders of the defined types were placed in this encounter.  Follow-up plan:   Return in about 3 months (around 12/13/2022) for Eval-day (M,W), (F2F), (MM).      Interventional Therapies  Risk  Complexity Considerations:   WNL  Planned  Pending:      Under consideration:      Completed:   Therapeutic bilateral IA hip injection (posterolateral approach) R3L2 (09/06/2022) (100/100/90/90)  Therapeutic bilateral lumbar facet MBB x3 (09/08/2020) (4-0) (100/100/60/60)  Therapeutic right lumbar facet RFA x2 (09/09/2021) (4-2) (100/100/90/>75)  Therapeutic left lumbar facet RFA x2 (10/07/2021) (4-0) (100/100/90/>75)  Therapeutic right IA hip inj. x2 (08/20/2020) (5-1) (100/100/80/80)  Therapeutic left IA hip inj. x1 (08/20/2020) (5-1) (100/100/80/80)  Therapeutic bilateral trochanteric bursa inj. x3 (04/07/2022)  (100/100/90/90)    Therapeutic  Palliative (PRN) options:   Palliative lumbar facet MBB   Palliative lumbar facet RFA  Palliative trochanteric bursa inj.    Pharmacotherapy  Nonopioids transferred 05/11/2020: Baclofen and Mobic        Recent Visits Date Type Provider Dept  09/22/22 Office Visit Milinda Pointer, MD [facility]  09/06/22 Procedure visit Milinda Pointer, MD [facility]  08/24/22 Office Visit Milinda Pointer, MD [facility]  06/29/22 Office Visit Milinda Pointer, MD [facility]  Showing recent visits within past 90 days and meeting all other requirements Today's Visits Date Type Provider Dept   09/26/22 Office Visit Milinda Pointer, MD [facility]  Showing today's visits and meeting all other requirements Future Appointments No visits were found meeting these conditions. Showing future appointments within next 90 days and meeting all other requirements  I discussed the assessment and treatment plan with the patient. The patient was provided an opportunity to ask questions and all were answered. The patient agreed with the plan and demonstrated an understanding of the instructions.  Patient advised to call back or seek an in-person evaluation if the symptoms or condition worsens.  Duration of encounter: 30 minutes.  Total time on encounter, as per AMA guidelines included both the face-to-face and non-face-to-face time personally spent by the physician and/or other qualified health care professional(s) on the day of the encounter (includes time in activities that require the physician or other qualified health care professional and does not include time in activities normally performed by clinical staff). Physician's time may include the following activities when performed: Preparing to see the patient (e.g., pre-charting review of records, searching for previously ordered imaging, lab work, and nerve conduction tests) Review of prior analgesic pharmacotherapies. Reviewing PMP Interpreting ordered tests (e.g., lab work, imaging, nerve conduction tests) Performing post-procedure evaluations, including interpretation of diagnostic procedures Obtaining and/or reviewing separately obtained history Performing a medically appropriate examination and/or evaluation Counseling and educating the patient/family/caregiver Ordering medications, tests, or procedures Referring and communicating with other health care professionals (when not separately reported) Documenting clinical information in the electronic or other health record Independently interpreting results (not separately  reported) and communicating results to the patient/ family/caregiver Care coordination (not separately reported)  Note by: [name], MD Date: 09/26/2022; Time: 3:18 PM

## 2022-10-03 ENCOUNTER — Ambulatory Visit: Payer: BC Managed Care – PPO | Admitting: Physician Assistant

## 2022-10-06 ENCOUNTER — Ambulatory Visit: Payer: BC Managed Care – PPO | Admitting: Physician Assistant

## 2022-10-06 VITALS — BP 136/82 | HR 81 | Ht 64.0 in | Wt 175.0 lb

## 2022-10-06 DIAGNOSIS — Z87448 Personal history of other diseases of urinary system: Secondary | ICD-10-CM | POA: Diagnosis not present

## 2022-10-06 DIAGNOSIS — N200 Calculus of kidney: Secondary | ICD-10-CM

## 2022-10-06 LAB — MICROSCOPIC EXAMINATION: Epithelial Cells (non renal): >10 /hpf — AB (ref 0–10)

## 2022-10-06 LAB — URINALYSIS, COMPLETE
Bilirubin, UA: NEGATIVE
Glucose, UA: NEGATIVE
Ketones, UA: NEGATIVE
Leukocytes,UA: NEGATIVE
Nitrite, UA: NEGATIVE
RBC, UA: NEGATIVE
Specific Gravity, UA: 1.025 (ref 1.005–1.030)
Urobilinogen, Ur: 0.2 mg/dL (ref 0.2–1.0)
pH, UA: 6 (ref 5.0–7.5)

## 2022-10-06 NOTE — Progress Notes (Signed)
10/06/2022 9:55 AM   Denise Burns 1971/05/14 [id number]  CC: Chief Complaint  Patient presents with   Follow-up   HPI: Denise Burns is a 52 y.o. female with PMH nephrolithiasis who presents today for hospital follow-up of sepsis due to right pyelonephritis and a right renal stone.  She was admitted at [facility] from 09/16/2022 to 09/18/2022 with sepsis due to right pyelonephritis.  Urine cultures finalized with insufficient growth.  She was treated with Rocephin and transitioned to [facility] on discharge for total of 14 days of antibiotics.  Notably, her previously seen 3 mm right lower pole stone was noted to have migrated to the right renal pelvis but remained nonobstructing.  She clinically improved without ureteral stent placement.  Today she reports she feels completely better with no acute concerns today.  She spontaneously passed a stone at home the night she was discharged from the hospital.  She thinks this was likely her right renal stone.  In-office UA today positive for trace protein; urine microscopy with >10 epithelial cells/hpf and many bacteria.  PMH: Past Medical History:  Diagnosis Date   Addison disease ([place])    Arthritis    Back pain    Complication of anesthesia    nausea   GERD (gastroesophageal reflux disease)    History of kidney stones     Surgical History: Past Surgical History:  Procedure Laterality Date   CHOLECYSTECTOMY  2010   COLONOSCOPY WITH PROPOFOL N/A 06/08/2022   Procedure: COLONOSCOPY WITH PROPOFOL;  Surgeon: Jonathon Bellows, MD;  Location: [facility] ENDOSCOPY;  Service: Gastroenterology;  Laterality: N/A;   CYSTOSCOPY W/ RETROGRADES Right 08/09/2021   Procedure: CYSTOSCOPY WITH RETROGRADE PYELOGRAM;  Surgeon: Hollice Espy, MD;  Location: [facility] ORS;  Service: Urology;  Laterality: Right;   CYSTOSCOPY W/ URETERAL STENT REMOVAL Right 08/09/2021   Procedure: CYSTOSCOPY WITH STENT REMOVAL;  Surgeon: Hollice Espy, MD;  Location: [facility] ORS;   Service: Urology;  Laterality: Right;   CYSTOSCOPY/URETEROSCOPY/HOLMIUM LASER/STENT PLACEMENT Right 06/28/2021   Procedure: CYSTOSCOPY/URETEROSCOPY/STENT PLACEMENT;  Surgeon: Hollice Espy, MD;  Location: [facility] ORS;  Service: Urology;  Laterality: Right;   CYSTOSCOPY/URETEROSCOPY/HOLMIUM LASER/STENT PLACEMENT Right 08/09/2021   Procedure: CYSTOSCOPY/URETEROSCOPY/HOLMIUM LASER/STENT PLACEMENT;  Surgeon: Hollice Espy, MD;  Location: [facility] ORS;  Service: Urology;  Laterality: Right;   dental work     DIAGNOSTIC LAPAROSCOPY  2004   endometriosis dx   DILATION AND CURETTAGE OF UTERUS  2010   IR NEPHROSTOMY PLACEMENT LEFT  12/14/2020   NEPHROLITHOTOMY Left 12/14/2020   Procedure: NEPHROLITHOTOMY PERCUTANEOUS;  Surgeon: Hollice Espy, MD;  Location: [facility] ORS;  Service: Urology;  Laterality: Left;   POLYPECTOMY     WISDOM TOOTH EXTRACTION      Home Medications:  Allergies as of 10/06/2022       Reactions   Sulfacetamide Sodium (acne)    Other reaction(s): sores   Tramadol Other (See Comments)   Other reaction(s): muscle tension   Lisinopril Rash, Cough   Sulfa Antibiotics Other (See Comments)   Blisters in mouth         Medication List        Accurate as of October 06, 2022  9:55 AM. If you have any questions, ask your nurse or doctor.          azelastine 0.1 % nasal spray Commonly known as: ASTELIN Place 1 spray into both nostrils 2 (two) times daily.   ergocalciferol 1.25 MG (50000 UT) capsule Commonly known as: Drisdol Take 1 capsule (50,000 Units total) by  mouth once a week.   escitalopram 5 MG tablet Commonly known as: Lexapro Take 1 tablet (5 mg total) by mouth daily.   Flonase Sensimist 27.5 MCG/SPRAY nasal spray Generic drug: fluticasone Place 1 spray into the nose in the morning and at bedtime.   fludrocortisone 0.1 MG tablet Commonly known as: FLORINEF Take 0.5 tablets (0.05 mg total) by mouth daily.   HYDROcodone-acetaminophen 7.5-325 MG tablet Commonly  known as: NORCO Take 1 tablet by mouth every 8 (eight) hours as needed for severe pain. Must last 30 days.   HYDROcodone-acetaminophen 7.5-325 MG tablet Commonly known as: NORCO Take 1 tablet by mouth every 8 (eight) hours as needed for severe pain. Must last 30 days. Start taking on: October 30, 2022   HYDROcodone-acetaminophen 7.5-325 MG tablet Commonly known as: NORCO Take 1 tablet by mouth every 8 (eight) hours as needed for severe pain. Must last 30 days. Start taking on: November 29, 2022   hydrocortisone 10 MG tablet Commonly known as: CORTEF Take 1.5 tablets (15 mg total) by mouth with breakfast and 1/2 tablet in the afternoon. (May double up during sick days)   meloxicam 15 MG tablet Commonly known as: MOBIC Take 1 tablet (15 mg total) by mouth daily.   naloxone 4 MG/0.1ML Liqd nasal spray kit Commonly known as: NARCAN Place 1 spray into the nose as needed for up to 365 doses (for opioid-induced respiratory depresssion). In case of emergency (overdose), spray once into each nostril. If no response within 3 minutes, repeat application and call [id number].   omega-3 acid ethyl esters 1 g capsule Commonly known as: LOVAZA Take 1 g by mouth daily.   pseudoephedrine 30 MG tablet Commonly known as: SUDAFED Take 30 mg by mouth every 12 (twelve) hours as needed for congestion.   Urogesic-Blue 81.6 MG Tabs Take 1 tablet (81.6 mg total) by mouth 4 (four) times daily as needed.        Allergies:  Allergies  Allergen Reactions   Sulfacetamide Sodium (Acne)     Other reaction(s): sores   Tramadol Other (See Comments)    Other reaction(s): muscle tension   Lisinopril Rash and Cough   Sulfa Antibiotics Other (See Comments)    Blisters in mouth     Family History: Family History  Problem Relation Age of Onset   Arthritis Mother    Hyperlipidemia Mother    Hypertension Mother    High blood pressure Mother    Lung cancer Mother    Liver cancer Mother    Brain cancer Mother     Alcohol abuse Father    Hearing loss Father    Hyperlipidemia Father    High blood pressure Father    Colon polyps Maternal Grandmother 9   Colon cancer Neg Hx    Esophageal cancer Neg Hx    Stomach cancer Neg Hx    Rectal cancer Neg Hx     Social History:   reports that she quit smoking about 28 years ago. Her smoking use included cigarettes. She has never used smokeless tobacco. She reports that she does not currently use alcohol. She reports that she does not use drugs.  Physical Exam: Ht '5\' 4"'$  (1.626 m)   Wt 175 lb (79.4 kg)   LMP 12/01/2020 (Approximate)   BMI 30.04 kg/m   Constitutional:  Alert and oriented, no acute distress, nontoxic appearing HEENT: [place], AT Cardiovascular: No clubbing, cyanosis, or edema Respiratory: Normal respiratory effort, no increased work of breathing Skin: No rashes, bruises  or suspicious lesions Neurologic: Grossly intact, no focal deficits, moving all 4 extremities Psychiatric: Normal mood and affect  Laboratory Data: Results for orders placed or performed in visit on 10/06/22  Microscopic Examination   Urine  Result Value Ref Range   WBC, UA 0-5 0 - 5 /hpf   RBC, Urine 0-2 0 - 2 /hpf   Epithelial Cells (non renal) >10 (A) 0 - 10 /hpf   Mucus, UA Present (A) Not Estab.   Bacteria, UA Many (A) None seen/Few  Urinalysis, Complete  Result Value Ref Range   Specific Gravity, UA 1.025 1.005 - 1.030   pH, UA 6.0 5.0 - 7.5   Color, UA Yellow Yellow   Appearance Ur Clear Clear   Leukocytes,UA Negative Negative   Protein,UA Trace (A) Negative/Trace   Glucose, UA Negative Negative   Ketones, UA Negative Negative   RBC, UA Negative Negative   Bilirubin, UA Negative Negative   Urobilinogen, Ur 0.2 0.2 - 1.0 mg/dL   Nitrite, UA Negative Negative   Microscopic Examination See below:    Assessment & Plan:   1. History of pyelonephritis Symptoms resolved and her UA is bland today after 14 days of empiric antibiotics.  I would have preferred  her to be discharged on a more tissue penetrating antibiotic, however no indication for further antibiotic therapy at this time.  We discussed return precautions including recurrent flank pain or dysuria. - Urinalysis, Complete   2. Right kidney stone She spontaneously passed a stone at home immediately following discharge, likely representing her right renal pelvic stone.  I offered her KUB for confirmation, however in the absence of symptoms or microscopic hematuria she declined, which is reasonable.  Will plan to see her back next year for annual follow-up with a KUB prior.  Return in about 1 year (around 10/06/2023) for Annual stone visit with KUB prior.  Debroah Loop, PA-C  [facility] [address] [place], [place] [address] [phone]

## 2022-10-09 ENCOUNTER — Other Ambulatory Visit: Payer: Self-pay

## 2022-10-10 ENCOUNTER — Other Ambulatory Visit: Payer: Self-pay

## 2022-10-10 ENCOUNTER — Other Ambulatory Visit (HOSPITAL_COMMUNITY): Payer: Self-pay

## 2022-10-26 NOTE — Progress Notes (Unsigned)
Established patient visit   Patient: Denise Burns   DOB: [birth year]   52 y.o. Female  MRN: [id number] Visit Date: 10/27/2022   No chief complaint on file.  Subjective    HPI  Hospital follow up -admitted 09/16/2022 through 09/18/2022  --sepsis resulting from UTI  -discharged on macrobid for 12 days    Medications: Outpatient Medications Prior to Visit  Medication Sig   azelastine (ASTELIN) 0.1 % nasal spray Place 1 spray into both nostrils 2 (two) times daily.   ergocalciferol (DRISDOL) 1.25 MG (50000 UT) capsule Take 1 capsule (50,000 Units total) by mouth once a week.   escitalopram (LEXAPRO) 5 MG tablet Take 1 tablet (5 mg total) by mouth daily.   fludrocortisone (FLORINEF) 0.1 MG tablet Take 0.5 tablets (0.05 mg total) by mouth daily.   fluticasone (FLONASE SENSIMIST) 27.5 MCG/SPRAY nasal spray Place 1 spray into the nose in the morning and at bedtime.   HYDROcodone-acetaminophen (NORCO) 7.5-325 MG tablet Take 1 tablet by mouth every 8 (eight) hours as needed for severe pain. Must last 30 days.   [START ON 10/30/2022] HYDROcodone-acetaminophen (NORCO) 7.5-325 MG tablet Take 1 tablet by mouth every 8 (eight) hours as needed for severe pain. Must last 30 days.   [START ON 11/29/2022] HYDROcodone-acetaminophen (NORCO) 7.5-325 MG tablet Take 1 tablet by mouth every 8 (eight) hours as needed for severe pain. Must last 30 days.   hydrocortisone (CORTEF) 10 MG tablet Take 1.5 tablets (15 mg total) by mouth with breakfast and 1/2 tablet in the afternoon. (May double up during sick days)   meloxicam (MOBIC) 15 MG tablet Take 1 tablet (15 mg total) by mouth daily.   Methen-Hyosc-Meth Blue-Na Phos (UROGESIC-BLUE) 81.6 MG TABS Take 1 tablet (81.6 mg total) by mouth 4 (four) times daily as needed.   naloxone (NARCAN) nasal spray 4 mg/0.1 mL Place 1 spray into the nose as needed for up to 365 doses (for opioid-induced respiratory depresssion). In case of emergency (overdose), spray once into  each nostril. If no response within 3 minutes, repeat application and call [id number].   omega-3 acid ethyl esters (LOVAZA) 1 g capsule Take 1 g by mouth daily.   pseudoephedrine (SUDAFED) 30 MG tablet Take 30 mg by mouth every 12 (twelve) hours as needed for congestion.    No facility-administered medications prior to visit.    Review of Systems  {Labs (Optional):23779}   Objective    There were no vitals filed for this visit. There is no height or weight on file to calculate BMI.  BP Readings from Last 3 Encounters:  10/06/22 136/82  09/26/22 (Abnormal) 153/90  09/22/22 (Abnormal) 164/88    Wt Readings from Last 3 Encounters:  10/06/22 175 lb (79.4 kg)  09/26/22 175 lb (79.4 kg)  09/22/22 175 lb (79.4 kg)    Physical Exam  ***  No results found for any visits on 10/27/22.  Assessment & Plan    There are no diagnoses linked to this encounter.   No follow-ups on file.         [name], NP  [facility] [phone] (phone) [phone] (fax)  [place]

## 2022-10-27 ENCOUNTER — Other Ambulatory Visit: Payer: Self-pay

## 2022-10-27 ENCOUNTER — Encounter: Payer: Self-pay | Admitting: Nurse Practitioner

## 2022-10-27 ENCOUNTER — Ambulatory Visit (INDEPENDENT_AMBULATORY_CARE_PROVIDER_SITE_OTHER): Payer: BC Managed Care – PPO | Admitting: Nurse Practitioner

## 2022-10-27 VITALS — BP 135/83 | HR 91 | Ht 64.0 in | Wt 186.4 lb

## 2022-10-27 DIAGNOSIS — Z87448 Personal history of other diseases of urinary system: Secondary | ICD-10-CM

## 2022-10-27 DIAGNOSIS — M47816 Spondylosis without myelopathy or radiculopathy, lumbar region: Secondary | ICD-10-CM

## 2022-10-27 DIAGNOSIS — N200 Calculus of kidney: Secondary | ICD-10-CM

## 2022-10-27 DIAGNOSIS — M5137 Other intervertebral disc degeneration, lumbosacral region: Secondary | ICD-10-CM

## 2022-10-27 MED ORDER — MELOXICAM 15 MG PO TABS
15.0000 mg | ORAL_TABLET | Freq: Every day | ORAL | 1 refills | Status: DC
  Filled 2022-10-27: qty 90, 90d supply, fill #0
  Filled 2023-02-17: qty 90, 90d supply, fill #1

## 2022-10-27 NOTE — Assessment & Plan Note (Signed)
Patient seeing pain management routinely. Pain well managed at this time.

## 2022-10-27 NOTE — Assessment & Plan Note (Signed)
Hospitalization from 09/16/2022 through 09/18/2022 due to pyelonephritis and sepsis.

## 2022-10-27 NOTE — Assessment & Plan Note (Signed)
Cause of uti and pyelonephritis. Passed 3.5 mm stone after discharge from the hospital. She is followed by urology

## 2022-10-28 ENCOUNTER — Encounter: Payer: Self-pay | Admitting: Nurse Practitioner

## 2022-10-31 ENCOUNTER — Other Ambulatory Visit: Payer: Self-pay | Admitting: Nurse Practitioner

## 2022-10-31 DIAGNOSIS — K219 Gastro-esophageal reflux disease without esophagitis: Secondary | ICD-10-CM

## 2022-10-31 MED ORDER — LANSOPRAZOLE 30 MG PO CPDR: 30.0000 mg | DELAYED_RELEASE_CAPSULE | Freq: Every day | ORAL | 1 refills | Status: AC

## 2022-11-06 ENCOUNTER — Other Ambulatory Visit: Payer: Self-pay | Admitting: Nurse Practitioner

## 2022-11-06 DIAGNOSIS — F411 Generalized anxiety disorder: Secondary | ICD-10-CM

## 2022-11-07 ENCOUNTER — Other Ambulatory Visit: Payer: Self-pay

## 2022-11-07 ENCOUNTER — Other Ambulatory Visit: Payer: Self-pay | Admitting: Nurse Practitioner

## 2022-11-07 DIAGNOSIS — F411 Generalized anxiety disorder: Secondary | ICD-10-CM

## 2022-11-07 MED FILL — Azelastine HCl Nasal Spray 0.1% (137 MCG/SPRAY): NASAL | 30 days supply | Qty: 30 | Fill #0 | Status: AC

## 2022-11-07 MED FILL — Escitalopram Oxalate Tab 5 MG (Base Equiv): ORAL | 90 days supply | Qty: 90 | Fill #0 | Status: AC

## 2022-11-18 IMAGING — CT CT RENAL STONE PROTOCOL
2 of 4 series · 15 of 46 positions shown, 17 images · non-contrast
Comparison: None.

CLINICAL DATA: Left lower abdominal/flank and back pain that
started last night at 11 p.m., kidney stone suspected.

EXAM:
CT ABDOMEN AND PELVIS WITHOUT CONTRAST
TECHNIQUE: Multidetector CT imaging of the abdomen and pelvis was performed
following the standard protocol without IV contrast.

[Series 2: stone full standard · axial · 0.72mm/px · z∈[-960,-564]mm · 12 of 87 slices shown, 14 images]
[im 4/87  soft-tissue]
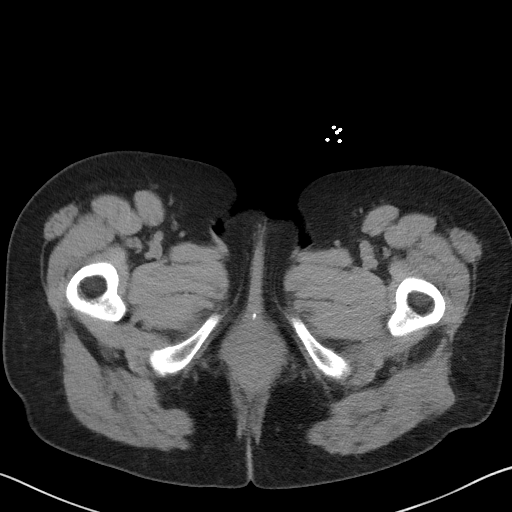
[im 4/87  bone]
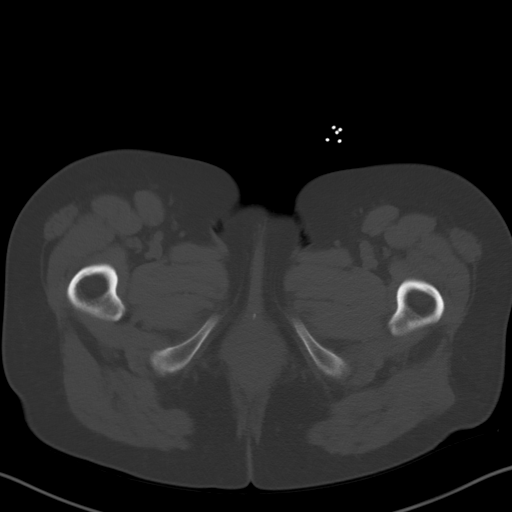
[im 12/87  soft-tissue]
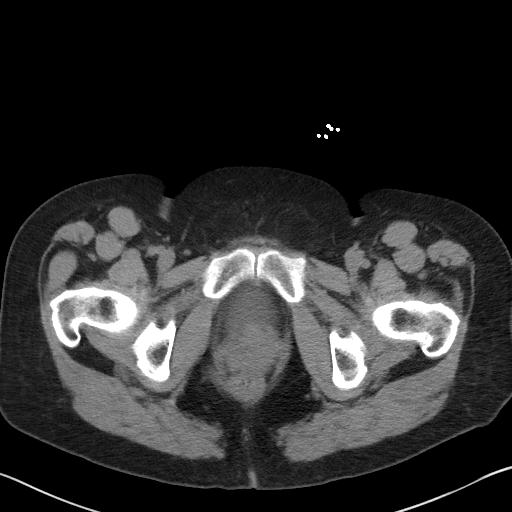
[im 19/87  soft-tissue]
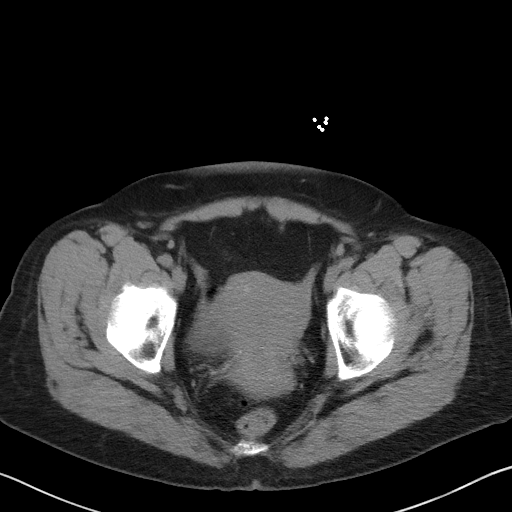
[im 27/87  soft-tissue]
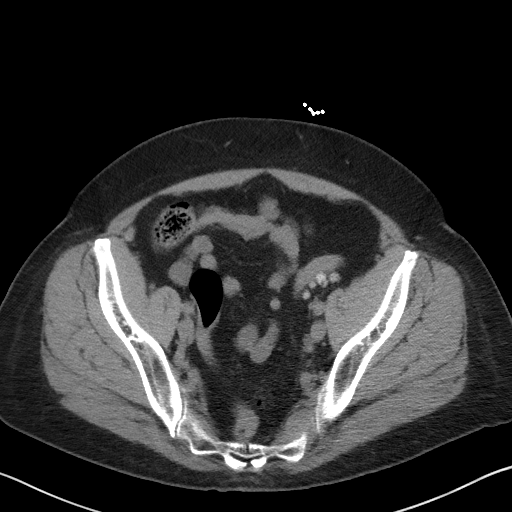
[im 34/87  soft-tissue]
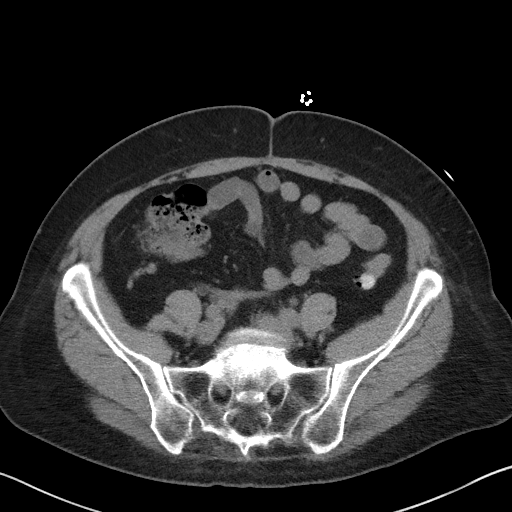
[im 42/87  soft-tissue]
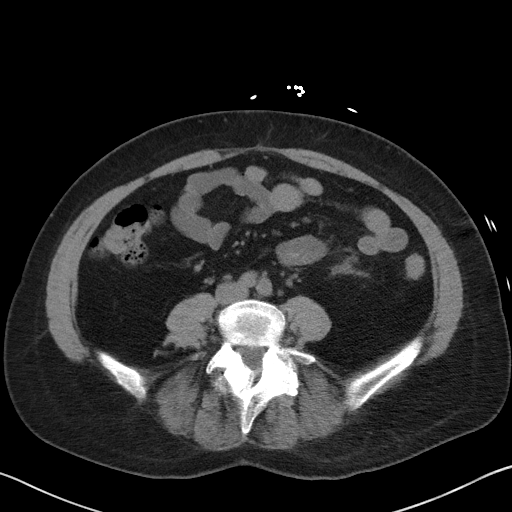
[im 45/87  soft-tissue]
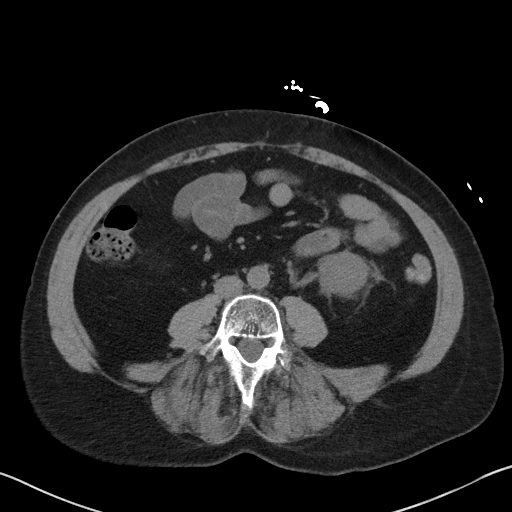
[im 53/87  soft-tissue]
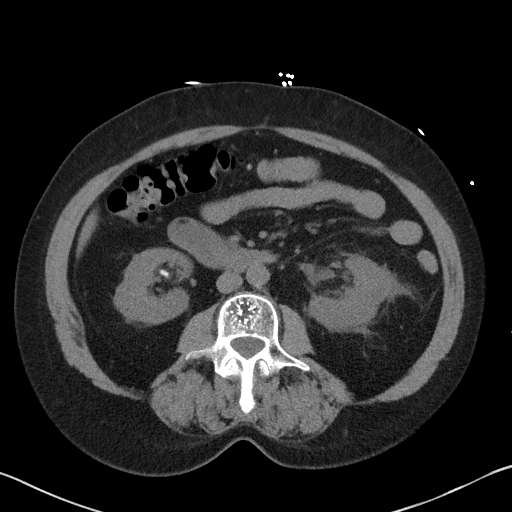
[im 60/87  soft-tissue]
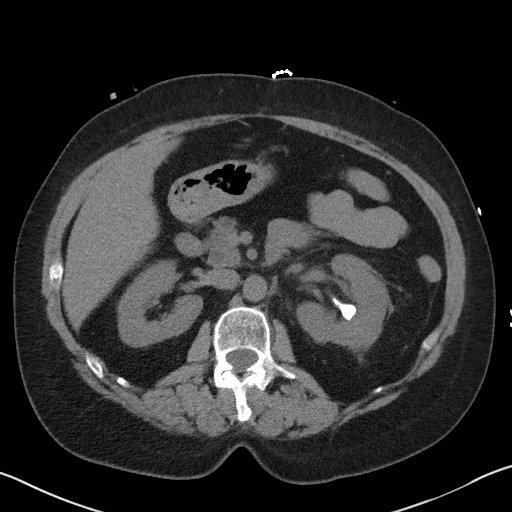
[im 60/87  bone]
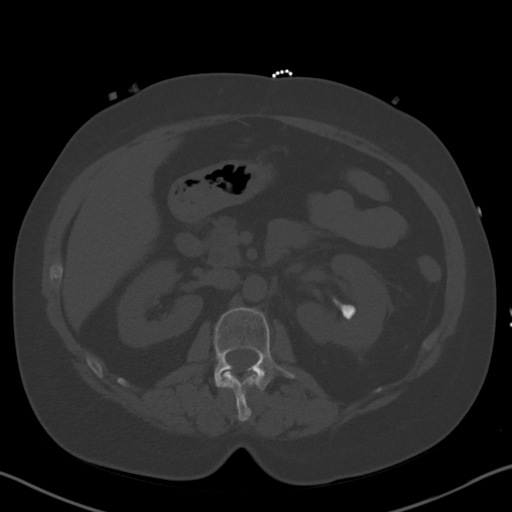
[im 68/87  soft-tissue]
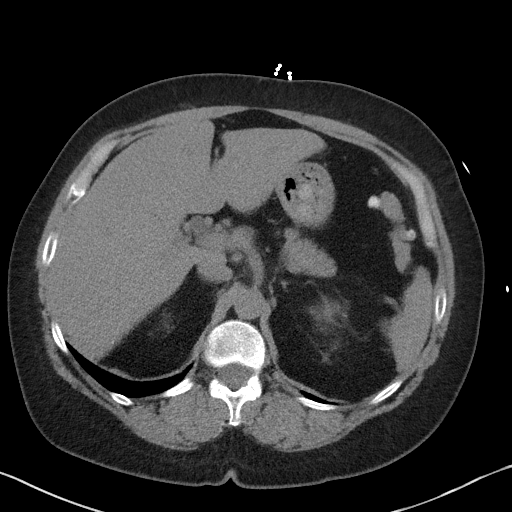
[im 75/87  soft-tissue]
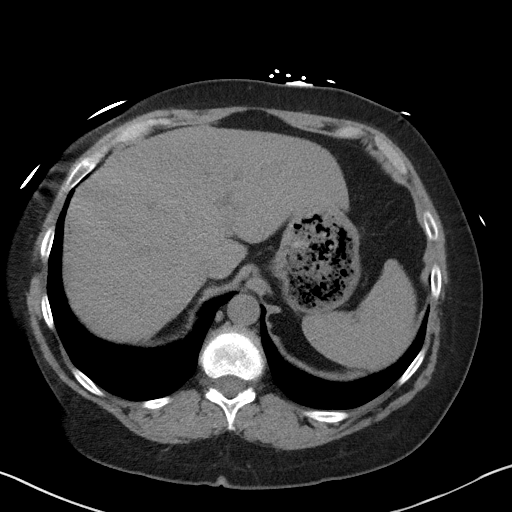
[im 83/87  soft-tissue]
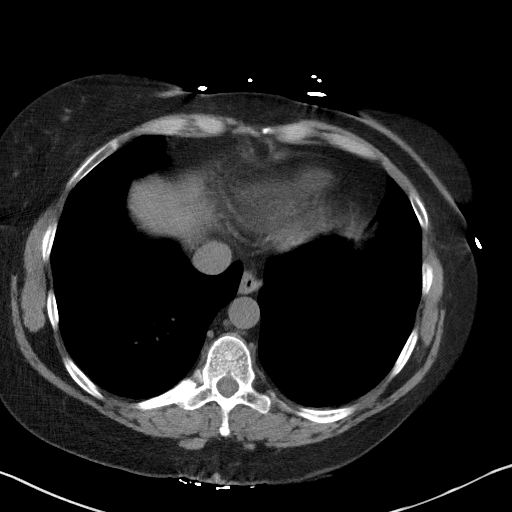

[Series 5: coronal · coronal · 0.68mm/px · 3 of 155 slices shown]
[im 52/155  soft-tissue]
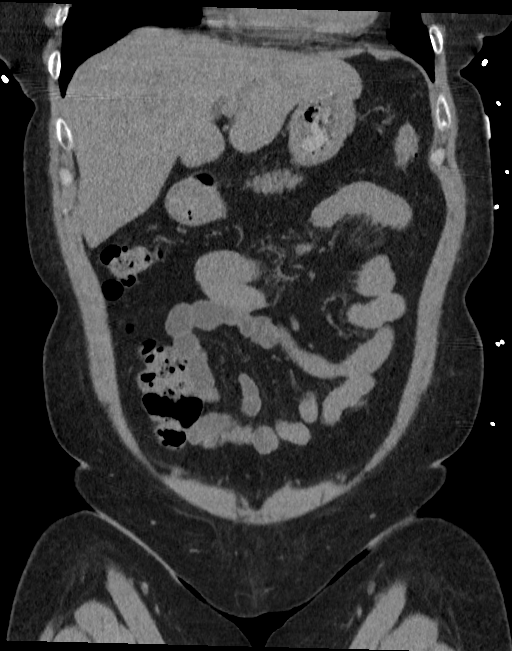
[im 69/155  soft-tissue]
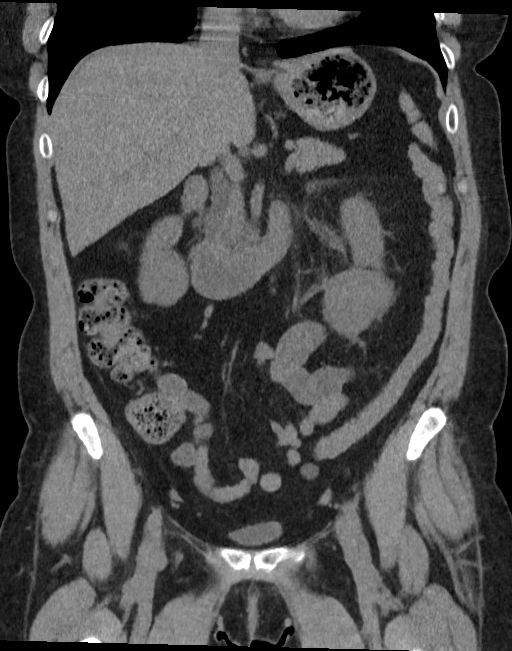
[im 86/155  soft-tissue]
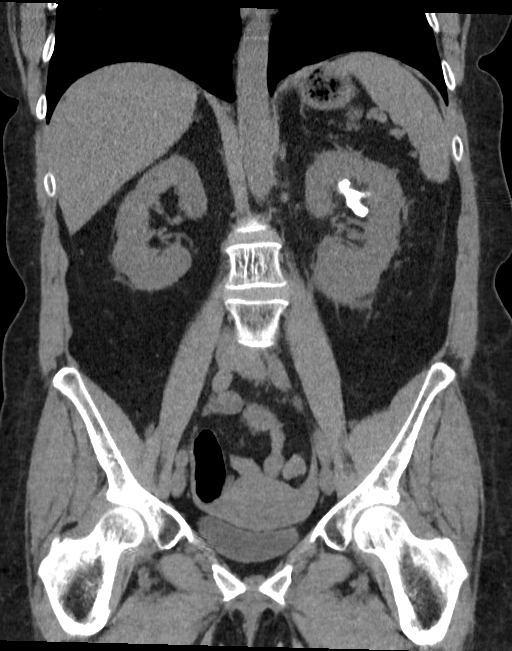

[15 of 46 positions shown; findings below may reference images not displayed]

FINDINGS: Lower chest: No acute abnormality. Normal size heart. No pericardial
effusion.

Hepatobiliary: Unremarkable noncontrast appearance of the hepatic
parenchyma. Gallbladder surgically absent. No biliary ductal
dilation.

Pancreas: Unremarkable

Spleen: Unremarkable

Adrenals/Urinary Tract: Bilateral adrenal glands are unremarkable.

Nonobstructive right lower pole renal calculus which measures 1.1 cm
on image 76/5. Nonobstructive staghorn type left upper pole renal
calculus which measures 3.0 cm on image 87/5. There is perinephric
and proximal periureteric stranding on the left. Urinary bladder is
grossly unremarkable for degree of distension.

Stomach/Bowel: Stomach is within normal limits. Appendix appears
normal. Colonic diverticulosis without findings of acute
diverticulitis. No evidence of bowel wall thickening, distention, or
inflammatory changes.

Vascular/Lymphatic: Aortic atherosclerosis. No enlarged abdominal or
pelvic lymph nodes.

Reproductive: Uterus and right adnexa are unremarkable. There is a
2.6 cm left adnexal cyst. No follow-up imaging recommended. Note:
This recommendation does not apply to premenarchal patients and to
those with increased risk (genetic, family history, elevated tumor
markers or other high-risk factors) of ovarian cancer. Reference:
JACR [DATE]):[id number]

Other: No large volume abdominopelvic ascites.

Musculoskeletal: Multilevel degenerative changes spine. T10 and L3
vertebral body hemangiomas. Bilateral L5 pars defects with grade 1
anterolisthesis of L5 on S1. No acute osseous abnormality.
IMPRESSION: 1. Nonobstructive staghorn type LEFT upper pole renal calculus which
measures 3.0 cm, with perinephric and proximal periureteric
stranding on the LEFT. Findings could be secondary to a recently
passed stone or urinary tract infection. Correlate with urinalysis.
2. Nonobstructive right nephrolithiasis.
3. Colonic diverticulosis without findings of acute diverticulitis.
4. Bilateral L5 pars defects with grade 1 anterolisthesis of L5 on
S1.
5. Aortic atherosclerosis.  Aortic Atherosclerosis ([id number]).

## 2022-11-30 ENCOUNTER — Other Ambulatory Visit: Payer: Self-pay | Admitting: Urology

## 2022-12-04 NOTE — Progress Notes (Unsigned)
PROVIDER NOTE: Information contained herein reflects review and annotations entered in association with encounter. Interpretation of such information and data should be left to medically-trained personnel. Information provided to patient can be located elsewhere in the medical record under "Patient Instructions". Document created using STT-dictation technology, any transcriptional errors that may result from process are unintentional.    Patient: Denise Burns  Service Category: E/M  Provider: Oswaldo Done, MD  DOB: [birth year]  DOS: 12/05/2022  Referring Provider: Carlean Jews, NP  MRN: [id number]  Specialty: Interventional Pain Management  PCP: Carlean Jews, NP  Type: Established Patient  Setting: Ambulatory outpatient    Location: Office  Delivery: Face-to-face     HPI  [name], a 52 y.o. year old female, is here today because of her No primary diagnosis found.. Denise Burns's primary complain today is No chief complaint on file.  Pertinent problems: Denise Burns has Chronic midline low back pain with bilateral sciatica; Chronic low back pain (1ry area of Pain) (Bilateral) (R>L) w/o sciatica; Chronic sacroiliac joint pain (2ry area of Pain) (Right); Chronic pain syndrome; Abnormal MRI, lumbar spine (03/22/2018); Lumbar facet hypertrophy (Multilevel) (Bilateral); Lumbar facet syndrome (Bilateral); DDD (degenerative disc disease), lumbosacral; Spondylolisthesis (8 mm) at L5-S1 level; Lumbar Grade 1 Retrolisthesis (2 mm) of L2/L3; L5-S1 pars defect w/ spondylolisthesis (Bilateral); Lumbar pars defect (L5-S1) (Bilateral); Lumbar foraminal stenosis (Left: L2-3) (Bilateral: L5-S1); Osteoarthritis of sacroiliac joint (Bilateral); Chronic musculoskeletal pain; Neurogenic pain; Osteoarthritis of facet joint of lumbar spine; Greater trochanteric bursitis (Bilateral); Lumbar spondylosis; Spondylosis without myelopathy or radiculopathy, lumbosacral region; Chronic hip pain (Right);  Osteoarthritis of hip (Right); Lumbar radiculitis (L1/L2) (Right); Chronic groin pain (Right); Chronic hip pain (Bilateral); Enthesopathy hip region (Left); Osteoarthritis of hips (Bilateral); Kissing spine syndrome; Chronic low back pain (Midline) w/o sciatica; and Trigger point with back pain (PSIS area) (Right) on their pertinent problem list. Pain Assessment: Severity of   is reported as a  /10. Location:    / . Onset:  . Quality:  . Timing:  . Modifying factor(s):  Marland Kitchen Vitals:  vitals were not taken for this visit.  BMI: Estimated body mass index is 32 kg/m as calculated from the following:   Height as of 10/27/22: 5\' 4"  (1.626 m).   Weight as of 10/27/22: 186 lb 6.4 oz (84.6 kg). Last encounter: 09/26/2022. Last procedure: 09/06/2022.  Reason for encounter:  *** . ***  Pharmacotherapy Assessment  Analgesic: Hydrocodone/APAP 7.5/325 1 tablet p.o. 3 times daily (22.5 mg/day of hydrocodone)  MME/day: 22.5 mg/day.   Monitoring: [place] PMP: PDMP reviewed during this encounter.       Pharmacotherapy: No side-effects or adverse reactions reported. Compliance: No problems identified. Effectiveness: Clinically acceptable.  No notes on file  No results found for: "CBDTHCR" No results found for: "D8THCCBX" No results found for: "D9THCCBX"  UDS:  Summary  Date Value Ref Range Status  03/28/2022 Note  Final    Comment:    ==================================================================== [employer] (MW) ==================================================================== Test                             Result       Flag       Units  Drug Present and Declared for Prescription Verification   Hydrocodone                    2442         EXPECTED  ng/mg creat   Hydromorphone                  467          EXPECTED   ng/mg creat   Dihydrocodeine                 227          EXPECTED   ng/mg creat   Norhydrocodone                 2628         EXPECTED   ng/mg creat    Sources of  hydrocodone include scheduled prescription medications.    Hydromorphone, dihydrocodeine and norhydrocodone are expected    metabolites of hydrocodone. Hydromorphone and dihydrocodeine are    also available as scheduled prescription medications.  ==================================================================== Test                      Result    Flag   Units      Ref Range   Creatinine              105              mg/dL      >=09 ==================================================================== Declared Medications:  The flagging and interpretation on this report are based on the  following declared medications.  Unexpected results may arise from  inaccuracies in the declared medications.   **Note: The testing scope of this panel includes these medications:   Hydrocodone (Norco)   **Note: The testing scope of this panel does not include the  following reported medications:   Acetaminophen (Norco)  Azelastine (Astelin)  Fludrocortisone (Florinef)  Fluticasone (Flonase)  Hydrocortisone (Cortef)  Hyoscyamine  Meloxicam (Mobic)  Methenamine  Methylene Blue  Pseudoephedrine (Sudafed)  Sodium Biphosphate ==================================================================== For clinical consultation, please call [phone]. ====================================================================       ROS  Constitutional: Denies any fever or chills Gastrointestinal: No reported hemesis, hematochezia, vomiting, or acute GI distress Musculoskeletal: Denies any acute onset joint swelling, redness, loss of ROM, or weakness Neurological: No reported episodes of acute onset apraxia, aphasia, dysarthria, agnosia, amnesia, paralysis, loss of coordination, or loss of consciousness  Medication Review  Azelastine HCl, HYDROcodone-acetaminophen, Urogesic-Blue, ergocalciferol, escitalopram, fludrocortisone, fluticasone, hydrocortisone, lansoprazole, meloxicam, naloxone, omega-3 acid  ethyl esters, and pseudoephedrine  History Review  Allergy: Denise Burns is allergic to sulfacetamide sodium (acne), tramadol, lisinopril, and sulfa antibiotics. Drug: Denise Burns  reports no history of drug use. Alcohol:  reports that she does not currently use alcohol. Tobacco:  reports that she quit smoking about 28 years ago. Her smoking use included cigarettes. She has never used smokeless tobacco. Social: Denise Burns  reports that she quit smoking about 28 years ago. Her smoking use included cigarettes. She has never used smokeless tobacco. She reports that she does not currently use alcohol. She reports that she does not use drugs. Medical:  has a past medical history of Addison disease (HCC), Arthritis, Back pain, Complication of anesthesia, GERD (gastroesophageal reflux disease), and History of kidney stones. Surgical: Denise Burns  has a past surgical history that includes Cholecystectomy (2010); Polypectomy; dental work; Dilation and curettage of uterus (2010); IR NEPHROSTOMY PLACEMENT LEFT (12/14/2020); Nephrolithotomy (Left, 12/14/2020); Wisdom tooth extraction; Diagnostic laparoscopy (2004); Cystoscopy/ureteroscopy/holmium laser/stent placement (Right, 06/28/2021); Cystoscopy w/ ureteral stent removal (Right, 08/09/2021); Cystoscopy w/ retrogrades (Right, 08/09/2021); Cystoscopy/ureteroscopy/holmium laser/stent placement (Right, 08/09/2021); and Colonoscopy with propofol (N/A, 06/08/2022). Family: family history includes Alcohol  abuse in her father; Arthritis in her mother; Brain cancer in her mother; Colon polyps (age of onset: 45) in her maternal grandmother; Hearing loss in her father; High blood pressure in her father and mother; Hyperlipidemia in her father and mother; Hypertension in her mother; Liver cancer in her mother; Lung cancer in her mother.  Laboratory Chemistry Profile   Renal Lab Results  Component Value Date   BUN 22 (H) 09/18/2022   CREATININE 0.72 09/18/2022   BCR 15  06/15/2022   GFR 84.36 08/26/2021   GFRAA 86 08/16/2018   GFRNONAA >60 09/18/2022    Hepatic Lab Results  Component Value Date   AST 36 09/16/2022   ALT 36 09/16/2022   ALBUMIN 4.1 09/16/2022   ALKPHOS 63 09/16/2022   LIPASE 38 09/16/2022    Electrolytes Lab Results  Component Value Date   NA 140 09/18/2022   K 4.6 09/18/2022   CL 108 09/18/2022   CALCIUM 8.7 (L) 09/18/2022   MG 2.2 09/18/2022   PHOS 4.3 09/18/2022    Bone Lab Results  Component Value Date   VD25OH 14.0 (L) 06/15/2022   25OHVITD1 67 08/16/2018   25OHVITD2 <1.0 08/16/2018   25OHVITD3 67 08/16/2018   TESTOSTERONE 1 (L) 08/13/2020    Inflammation (CRP: Acute Phase) (ESR: Chronic Phase) Lab Results  Component Value Date   CRP 2 08/16/2018   ESRSEDRATE 37 (H) 08/16/2018   LATICACIDVEN 1.9 09/16/2022         Note: Above Lab results reviewed.  Recent Imaging Review  US RENAL CLINICAL DATA:  Right hydronephrosis  EXAM: RENAL / URINARY TRACT ULTRASOUND COMPLETE  COMPARISON:  CT abdomen pelvis 09/16/2022  FINDINGS: Right Kidney:  Renal measurements: 11.8 x 5.4 x 5.8 cm = volume: 194 mL. Echogenicity within normal limits. No mass or hydronephrosis visualized.  Left Kidney:  Renal measurements: 12.6 x 5.7 x 5.3 cm = volume: 199 mL. Echogenicity within normal limits. No mass or hydronephrosis visualized.  Bladder:  Appears normal for degree of bladder distention.  Other:  None.  IMPRESSION: No hydronephrosis. No significant sonographic abnormality of the kidneys.  Electronically Signed   By: Acquanetta Belling M.D.   On: 09/17/2022 17:32 Note: Reviewed        Physical Exam  General appearance: Well nourished, well developed, and well hydrated. In no apparent acute distress Mental status: Alert, oriented x 3 (person, place, & time)       Respiratory: No evidence of acute respiratory distress Eyes: PERLA Vitals: LMP 12/01/2020 (Approximate)  BMI: Estimated body mass index is 32 kg/m  as calculated from the following:   Height as of 10/27/22: 5\' 4"  (1.626 m).   Weight as of 10/27/22: 186 lb 6.4 oz (84.6 kg). Ideal: Patient weight not recorded  Assessment   Diagnosis Status  No diagnosis found. Controlled Controlled Controlled   Updated Problems: No problems updated.  Plan of Care  Problem-specific:  No problem-specific Assessment & Plan notes found for this encounter.  Denise Burns has a current medication list which includes the following long-term medication(s): azelastine hcl, escitalopram, flonase sensimist, hydrocodone-acetaminophen, hydrocodone-acetaminophen, hydrocodone-acetaminophen, lansoprazole, meloxicam, and omega-3 acid ethyl esters.  Pharmacotherapy (Medications Ordered): No orders of the defined types were placed in this encounter.  Orders:  No orders of the defined types were placed in this encounter.  Follow-up plan:   No follow-ups on file.      Interventional Therapies  Risk  Complexity Considerations:   WNL   Planned  Pending:  Under consideration:      Completed:   Therapeutic bilateral IA hip injection (posterolateral approach) R3L2 (09/06/2022) (100/100/90/90)  Therapeutic bilateral lumbar facet MBB x3 (09/08/2020) (4-0) (100/100/60/60)  Therapeutic right lumbar facet RFA x2 (09/09/2021) (4-2) (100/100/90/>75)  Therapeutic left lumbar facet RFA x2 (10/07/2021) (4-0) (100/100/90/>75)  Therapeutic right IA hip inj. x2 (08/20/2020) (5-1) (100/100/80/80)  Therapeutic left IA hip inj. x1 (08/20/2020) (5-1) (100/100/80/80)  Therapeutic bilateral trochanteric bursa inj. x3 (04/07/2022)  (100/100/90/90)    Therapeutic  Palliative (PRN) options:   Palliative lumbar facet MBB   Palliative lumbar facet RFA  Palliative trochanteric bursa inj.    Pharmacotherapy  Nonopioids transferred 05/11/2020: Baclofen and Mobic         Recent Visits Date Type Provider Dept  09/26/22 Office Visit Delano Metz, MD  [facility]  09/22/22 Office Visit Delano Metz, MD [facility]  09/06/22 Procedure visit Delano Metz, MD [facility]  Showing recent visits within past 90 days and meeting all other requirements Future Appointments Date Type Provider Dept  12/05/22 Appointment Delano Metz, MD [facility]  Showing future appointments within next 90 days and meeting all other requirements  I discussed the assessment and treatment plan with the patient. The patient was provided an opportunity to ask questions and all were answered. The patient agreed with the plan and demonstrated an understanding of the instructions.  Patient advised to call back or seek an in-person evaluation if the symptoms or condition worsens.  Duration of encounter: *** minutes.  Total time on encounter, as per AMA guidelines included both the face-to-face and non-face-to-face time personally spent by the physician and/or other qualified health care professional(s) on the day of the encounter (includes time in activities that require the physician or other qualified health care professional and does not include time in activities normally performed by clinical staff). Physician's time may include the following activities when performed: Preparing to see the patient (e.g., pre-charting review of records, searching for previously ordered imaging, lab work, and nerve conduction tests) Review of prior analgesic pharmacotherapies. Reviewing PMP Interpreting ordered tests (e.g., lab work, imaging, nerve conduction tests) Performing post-procedure evaluations, including interpretation of diagnostic procedures Obtaining and/or reviewing separately obtained history Performing a medically appropriate examination and/or evaluation Counseling and educating the patient/family/caregiver Ordering medications, tests, or procedures Referring and communicating with other health care professionals  (when not separately reported) Documenting clinical information in the electronic or other health record Independently interpreting results (not separately reported) and communicating results to the patient/ family/caregiver Care coordination (not separately reported)  Note by: Oswaldo Done, MD Date: 12/05/2022; Time: 3:56 PM

## 2022-12-05 ENCOUNTER — Ambulatory Visit: Payer: BC Managed Care – PPO | Attending: Pain Medicine | Admitting: Pain Medicine

## 2022-12-05 ENCOUNTER — Encounter: Payer: Self-pay | Admitting: Pain Medicine

## 2022-12-05 DIAGNOSIS — M545 Low back pain, unspecified: Secondary | ICD-10-CM

## 2022-12-05 DIAGNOSIS — M25551 Pain in right hip: Secondary | ICD-10-CM | POA: Diagnosis not present

## 2022-12-05 DIAGNOSIS — Z79899 Other long term (current) drug therapy: Secondary | ICD-10-CM | POA: Diagnosis not present

## 2022-12-05 DIAGNOSIS — G894 Chronic pain syndrome: Secondary | ICD-10-CM | POA: Diagnosis not present

## 2022-12-05 DIAGNOSIS — M25552 Pain in left hip: Secondary | ICD-10-CM

## 2022-12-05 DIAGNOSIS — M533 Sacrococcygeal disorders, not elsewhere classified: Secondary | ICD-10-CM

## 2022-12-05 DIAGNOSIS — Z79891 Long term (current) use of opiate analgesic: Secondary | ICD-10-CM

## 2022-12-05 DIAGNOSIS — M47816 Spondylosis without myelopathy or radiculopathy, lumbar region: Secondary | ICD-10-CM

## 2022-12-05 DIAGNOSIS — G8929 Other chronic pain: Secondary | ICD-10-CM

## 2022-12-05 MED ORDER — HYDROCODONE-ACETAMINOPHEN 7.5-325 MG PO TABS
1.0000 | ORAL_TABLET | Freq: Three times a day (TID) | ORAL | 0 refills | Status: DC | PRN
Start: 2022-12-29 — End: 2023-03-20

## 2022-12-05 MED ORDER — HYDROCODONE-ACETAMINOPHEN 7.5-325 MG PO TABS
1.0000 | ORAL_TABLET | Freq: Three times a day (TID) | ORAL | 0 refills | Status: DC | PRN
Start: 2023-02-27 — End: 2023-03-20

## 2022-12-05 MED ORDER — HYDROCODONE-ACETAMINOPHEN 7.5-325 MG PO TABS
1.0000 | ORAL_TABLET | Freq: Three times a day (TID) | ORAL | 0 refills | Status: DC | PRN
Start: 2023-01-28 — End: 2023-03-20

## 2022-12-05 NOTE — Patient Instructions (Signed)
____________________________________________________________________________________________  Opioid Pain Medication Update  To: All patients taking opioid pain medications. (I.e.: hydrocodone, hydromorphone, oxycodone, oxymorphone, morphine, codeine, methadone, tapentadol, tramadol, buprenorphine, fentanyl, etc.)  Re: Updated review of side effects and adverse reactions of opioid analgesics, as well as new information about long term effects of this class of medications.  Direct risks of long-term opioid therapy are not limited to opioid addiction and overdose. Potential medical risks include serious fractures, breathing problems during sleep, hyperalgesia, immunosuppression, chronic constipation, bowel obstruction, myocardial infarction, and tooth decay secondary to xerostomia.  Unpredictable adverse effects that can occur even if you take your medication correctly: Cognitive impairment, respiratory depression, and death. Most people think that if they take their medication "correctly", and "as instructed", that they will be safe. Nothing could be farther from the truth. In reality, a significant amount of recorded deaths associated with the use of opioids has occurred in individuals that had taken the medication for a long time, and were taking their medication correctly. The following are examples of how this can happen: Patient taking his/her medication for a long time, as instructed, without any side effects, is given a certain antibiotic or another unrelated medication, which in turn triggers a "Drug-to-drug interaction" leading to disorientation, cognitive impairment, impaired reflexes, respiratory depression or an untoward event leading to serious bodily harm or injury, including death.  Patient taking his/her medication for a long time, as instructed, without any side effects, develops an acute impairment of liver and/or kidney function. This will lead to a rapid inability of the body to  breakdown and eliminate their pain medication, which will result in effects similar to an "overdose", but with the same medicine and dose that they had always taken. This again may lead to disorientation, cognitive impairment, impaired reflexes, respiratory depression or an untoward event leading to serious bodily harm or injury, including death.  A similar problem will occur with patients as they grow older and their liver and kidney function begins to decrease as part of the aging process.  Background information: Historically, the original case for using long-term opioid therapy to treat chronic noncancer pain was based on safety assumptions that subsequent experience has called into question. In 1996, the American Pain Society and the American Academy of Pain Medicine issued a consensus statement supporting long-term opioid therapy. This statement acknowledged the dangers of opioid prescribing but concluded that the risk for addiction was low; respiratory depression induced by opioids was short-lived, occurred mainly in opioid-naive patients, and was antagonized by pain; tolerance was not a common problem; and efforts to control diversion should not constrain opioid prescribing. This has now proven to be wrong. Experience regarding the risks for opioid addiction, misuse, and overdose in community practice has failed to support these assumptions.  According to the Centers for Disease Control and Prevention, fatal overdoses involving opioid analgesics have increased sharply over the past decade. Currently, more than 96,700 people die from drug overdoses every year. Opioids are a factor in 7 out of every 10 overdose deaths. Deaths from drug overdose have surpassed motor vehicle accidents as the leading cause of death for individuals between the ages of 35 and 54.  Clinical data suggest that neuroendocrine dysfunction may be very common in both men and women, potentially causing hypogonadism, erectile  dysfunction, infertility, decreased libido, osteoporosis, and depression. Recent studies linked higher opioid dose to increased opioid-related mortality. Controlled observational studies reported that long-term opioid therapy may be associated with increased risk for cardiovascular events. Subsequent meta-analysis concluded   that the safety of long-term opioid therapy in elderly patients has not been proven.   Side Effects and adverse reactions: Common side effects: Drowsiness (sedation). Dizziness. Nausea and vomiting. Constipation. Physical dependence -- Dependence often manifests with withdrawal symptoms when opioids are discontinued or decreased. Tolerance -- As you take repeated doses of opioids, you require increased medication to experience the same effect of pain relief. Respiratory depression -- This can occur in healthy people, especially with higher doses. However, people with COPD, asthma or other lung conditions may be even more susceptible to fatal respiratory impairment.  Uncommon side effects: An increased sensitivity to feeling pain and extreme response to pain (hyperalgesia). Chronic use of opioids can lead to this. Delayed gastric emptying (the process by which the contents of your stomach are moved into your small intestine). Muscle rigidity. Immune system and hormonal dysfunction. Quick, involuntary muscle jerks (myoclonus). Arrhythmia. Itchy skin (pruritus). Dry mouth (xerostomia).  Long-term side effects: Chronic constipation. Sleep-disordered breathing (SDB). Increased risk of bone fractures. Hypothalamic-pituitary-adrenal dysregulation. Increased risk of overdose.  RISKS: Fractures and Falls:  Opioids increase the risk and incidence of falls. This is of particular importance in elderly patients.  Endocrine System:  Long-term administration is associated with endocrine abnormalities (endocrinopathies). (Also known as Opioid-induced Endocrinopathy) Influences  on both the hypothalamic-pituitary-adrenal axis?and the hypothalamic-pituitary-gonadal axis have been demonstrated with consequent hypogonadism and adrenal insufficiency in both sexes. Hypogonadism and decreased levels of dehydroepiandrosterone sulfate have been reported in men and women. Endocrine effects include: Amenorrhoea in women (abnormal absence of menstruation) Reduced libido in both sexes Decreased sexual function Erectile dysfunction in men Hypogonadisms (decreased testicular function with shrinkage of testicles) Infertility Depression and fatigue Loss of muscle mass Anxiety Depression Immune suppression Hyperalgesia Weight gain Anemia Osteoporosis Patients (particularly women of childbearing age) should avoid opioids. There is insufficient evidence to recommend routine monitoring of asymptomatic patients taking opioids in the long-term for hormonal deficiencies.  Immune System: Human studies have demonstrated that opioids have an immunomodulating effect. These effects are mediated via opioid receptors both on immune effector cells and in the central nervous system. Opioids have been demonstrated to have adverse effects on antimicrobial response and anti-tumour surveillance. Buprenorphine has been demonstrated to have no impact on immune function.  Opioid Induced Hyperalgesia: Human studies have demonstrated that prolonged use of opioids can lead to a state of abnormal pain sensitivity, sometimes called opioid induced hyperalgesia (OIH). Opioid induced hyperalgesia is not usually seen in the absence of tolerance to opioid analgesia. Clinically, hyperalgesia may be diagnosed if the patient on long-term opioid therapy presents with increased pain. This might be qualitatively and anatomically distinct from pain related to disease progression or to breakthrough pain resulting from development of opioid tolerance. Pain associated with hyperalgesia tends to be more diffuse than the  pre-existing pain and less defined in quality. Management of opioid induced hyperalgesia requires opioid dose reduction.  Cancer: Chronic opioid therapy has been associated with an increased risk of cancer among noncancer patients with chronic pain. This association was more evident in chronic strong opioid users. Chronic opioid consumption causes significant pathological changes in the small intestine and colon. Epidemiological studies have found that there is a link between opium dependence and initiation of gastrointestinal cancers. Cancer is the second leading cause of death after cardiovascular disease. Chronic use of opioids can cause multiple conditions such as GERD, immunosuppression and renal damage as well as carcinogenic effects, which are associated with the incidence of cancers.   Mortality: Long-term opioid use   has been associated with increased mortality among patients with chronic non-cancer pain (CNCP).  Prescription of long-acting opioids for chronic noncancer pain was associated with a significantly increased risk of all-cause mortality, including deaths from causes other than overdose.  Reference: Von Korff M, Kolodny A, Deyo RA, Chou R. Long-term opioid therapy reconsidered. Ann Intern Med. 2011 Sep 6;155(5):325-8. doi: 10.7326/[id number]. PMID: 21893626; PMCID: PMC3280085. Bedson J, Chen Y, Ashworth J, Hayward RA, Dunn KM, Jordan KP. Risk of adverse events in patients prescribed long-term opioids: A cohort study in the [place] Clinical Practice Research Datalink. Eur J Pain. 2019 May;23(5):908-922. doi: 10.1002/ejp.1357. Epub 2019 Jan 31. PMID: 30620116. Colameco S, Coren JS, Ciervo CA. Continuous opioid treatment for chronic noncancer pain: a time for moderation in prescribing. Postgrad Med. 2009 Jul;121(4):61-6. doi: 10.3810/pgm.2009.07.2032. PMID: 19641271. Chou R, Turner JA, Devine EB, Hansen RN, Sullivan SD, Blazina I, Dana T, Bougatsos C, Deyo RA. The  effectiveness and risks of long-term opioid therapy for chronic pain: a systematic review for a National Institutes of Health Pathways to Prevention Workshop. Ann Intern Med. 2015 Feb 17;162(4):276-86. doi: 10.7326/M14-2559. PMID: 25581257. Warner M, Chen LH, Makuc DM. NCHS Data Brief No. 22. [place]: Centers for Disease Control and Prevention; 2009. Sep, Increase in Fatal Poisonings Involving Opioid Analgesics in the [place], 1999-2006. Song IA, Choi HR, Oh TK. Long-term opioid use and mortality in patients with chronic non-cancer pain: Ten-year follow-up study in [place] from 2010 through 2019. EClinicalMedicine. 2022 Jul 18;51:101558. doi: 10.1016/j.eclinm.2022.101558. PMID: 35875817; PMCID: PMC9304910. Huser, W., Schubert, T., Vogelmann, T. et al. All-cause mortality in patients with long-term opioid therapy compared with non-opioid analgesics for chronic non-cancer pain: a database study. BMC Med 18, 162 (2020). https://doi.org/10.1186/[email or web address] Rashidian H, Zendehdel K, Kamangar F, Malekzadeh R, Haghdoost AA. An Ecological Study of the Association between Opiate Use and Incidence of Cancers. Addict Health. 2016 Fall;8(4):252-260. PMID: 28819556; PMCID: PMC5554805.  Our Goal: Our goal is to control your pain with means other than the use of opioid pain medications.  Our Recommendation: Talk to your physician about coming off of these medications. We can assist you with the tapering down and stopping these medicines. Based on the new information, even if you cannot completely stop the medication, a decrease in the dose may be associated with a lesser risk. Ask for other means of controlling the pain. Decrease or eliminate those factors that significantly contribute to your pain such as smoking, obesity, and a diet heavily tilted towards "inflammatory" nutrients.  Last Updated: 09/28/2022    ____________________________________________________________________________________________     ____________________________________________________________________________________________  Patient Information update  To: All of our patients.  Re: Name change.  It has been made official that our current name, "[facility]"   will soon be changed to "[facility]".   The purpose of this change is to eliminate any confusion created by the concept of our practice being a "Medication Management Pain Clinic". In the past this has led to the misconception that we treat pain primarily by the use of prescription medications.  Nothing can be farther from the truth.   Understanding PAIN MANAGEMENT: To further understand what our practice does, you first have to understand that "Pain Management" is a subspecialty that requires additional training once a physician has completed their specialty training, which can be in either Anesthesia, Neurology, Psychiatry, or Physical Medicine and Rehabilitation (PMR). Each one of these contributes to the final approach taken by each physician to   the management of their patient's pain. To be a "Pain Management Specialist" you must have first completed one of the specialty trainings below.  Anesthesiologists - trained in clinical pharmacology and interventional techniques such as nerve blockade and regional as well as central neuroanatomy. They are trained to block pain before, during, and after surgical interventions.  Neurologists - trained in the diagnosis and pharmacological treatment of complex neurological conditions, such as Multiple Sclerosis, Parkinson's, spinal cord injuries, and other systemic conditions that may be associated with symptoms that may include but are not limited to pain. They tend to rely primarily on the treatment of chronic pain  using prescription medications.  Psychiatrist - trained in conditions affecting the psychosocial wellbeing of patients including but not limited to depression, anxiety, schizophrenia, personality disorders, addiction, and other substance use disorders that may be associated with chronic pain. They tend to rely primarily on the treatment of chronic pain using prescription medications.   Physical Medicine and Rehabilitation (PMR) physicians, also known as physiatrists - trained to treat a wide variety of medical conditions affecting the brain, spinal cord, nerves, bones, joints, ligaments, muscles, and tendons. Their training is primarily aimed at treating patients that have suffered injuries that have caused severe physical impairment. Their training is primarily aimed at the physical therapy and rehabilitation of those patients. They may also work alongside orthopedic surgeons or neurosurgeons using their expertise in assisting surgical patients to recover after their surgeries.  INTERVENTIONAL PAIN MANAGEMENT is sub-subspecialty of Pain Management.  Our physicians are Board-certified in Anesthesia, Pain Management, and Interventional Pain Management.  This meaning that not only have they been trained and Board-certified in their specialty of Anesthesia, and subspecialty of Pain Management, but they have also received further training in the sub-subspecialty of Interventional Pain Management, in order to become Board-certified as INTERVENTIONAL PAIN MANAGEMENT SPECIALIST.    Mission: Our goal is to use our skills in  INTERVENTIONAL PAIN MANAGEMENT as alternatives to the chronic use of prescription opioid medications for the treatment of pain. To make this more clear, we have changed our name to reflect what we do and offer. We will continue to offer medication management assessment and recommendations, but we will not be taking over any patient's medication  management.  ____________________________________________________________________________________________     ____________________________________________________________________________________________  National Pain Medication Shortage  The [place] is experiencing worsening drug shortages. These have had a negative widespread effect on patient care and treatment. Not expected to improve any time soon. Predicted to last past 2029.   Drug shortage list (generic names) Oxycodone IR Oxycodone/APAP Oxymorphone IR Hydromorphone Hydrocodone/APAP Morphine  Where is the problem?  Manufacturing and supply level.  Will this shortage affect you?  Only if you take any of the above pain medications.  How? You may be unable to fill your prescription.  Your pharmacist may offer a "partial fill" of your prescription. (Warning: Do not accept partial fills.) Prescriptions partially filled cannot be transferred to another pharmacy. Read our Medication Rules and Regulation. Depending on how much medicine you are dependent on, you may experience withdrawals when unable to get the medication.  Recommendations: Consider ending your dependence on opioid pain medications. Ask your pain specialist to assist you with the process. Consider switching to a medication currently not in shortage, such as Buprenorphine. Talk to your pain specialist about this option. Consider decreasing your pain medication requirements by managing tolerance thru "Drug Holidays". This may help minimize withdrawals, should you run out of medicine. Control your pain thru   the use of non-pharmacological interventional therapies.   Your prescriber: Prescribers cannot be blamed for shortages. Medication manufacturing and supply issues cannot be fixed by the prescriber.   NOTE: The prescriber is not responsible for supplying the medication, or solving supply issues. Work with your pharmacist to solve it. The patient is responsible for  the decision to take or continue taking the medication and for identifying and securing a legal supply source. By law, supplying the medication is the job and responsibility of the pharmacy. The prescriber is responsible for the evaluation, monitoring, and prescribing of these medications.   Prescribers will NOT: Re-issue prescriptions that have been partially filled. Re-issue prescriptions already sent to a pharmacy.  Re-send prescriptions to a different pharmacy because yours did not have your medication. Ask pharmacist to order more medicine or transfer the prescription to another pharmacy. (Read below.)  New 2023 regulation: "April 01, 2022 Revised Regulation Allows DEA-Registered Pharmacies to Transfer Electronic Prescriptions at a Patient's Request DEA Headquarters Division - Public Information Office Patients now have the ability to request their electronic prescription be transferred to another pharmacy without having to go back to their practitioner to initiate the request. This revised regulation went into effect on Monday, March 28, 2022.     At a patient's request, a DEA-registered retail pharmacy can now transfer an electronic prescription for a controlled substance (schedules II-V) to another DEA-registered retail pharmacy. Prior to this change, patients would have to go through their practitioner to cancel their prescription and have it re-issued to a different pharmacy. The process was taxing and time consuming for both patients and practitioners.    The Drug Enforcement Administration (DEA) published its intent to revise the process for transferring electronic prescriptions on June 19, 2020.  The final rule was published in the federal register on February 24, 2022 and went into effect 30 days later.  Under the final rule, a prescription can only be transferred once between pharmacies, and only if allowed under existing state or other applicable law. The prescription must  remain in its electronic form; may not be altered in any way; and the transfer must be communicated directly between two licensed pharmacists. It's important to note, any authorized refills transfer with the original prescription, which means the entire prescription will be filled at the same pharmacy".  Reference: https://www.dea.gov/stories/2023/2023-04/2022-09-01/revised-regulation-allows-dea-registered-pharmacies-transfer (DEA website announcement)  https://www.govinfo.gov/content/pkg/FR-2022-02-24/pdf/2023-15847.pdf (Federal Register  Department of Justice)   Federal Register / Vol. 88, No. 143 / Thursday, February 24, 2022 / Rules and Regulations DEPARTMENT OF JUSTICE  Drug Enforcement Administration  21 CFR Part 1306  [Docket No. [id number]]  RIN [id number]-AB64 Transfer of Electronic Prescriptions for Schedules II-V Controlled Substances Between Pharmacies for Initial Filling  ____________________________________________________________________________________________     ____________________________________________________________________________________________  Transfer of Pain Medication between Pharmacies  Re: 2023 DEA Clarification on existing regulation  Published on DEA Website: April 01, 2022  Title: Revised Regulation Allows DEA-Registered Pharmacies to Transfer Electronic Prescriptions at a Patient's Request DEA Headquarters Division - Public Information Office  "Patients now have the ability to request their electronic prescription be transferred to another pharmacy without having to go back to their practitioner to initiate the request. This revised regulation went into effect on Monday, March 28, 2022.     At a patient's request, a DEA-registered retail pharmacy can now transfer an electronic prescription for a controlled substance (schedules II-V) to another DEA-registered retail pharmacy. Prior to this change, patients would have to go through their practitioner to  cancel their prescription   and have it re-issued to a different pharmacy. The process was taxing and time consuming for both patients and practitioners.    The Drug Enforcement Administration (DEA) published its intent to revise the process for transferring electronic prescriptions on June 19, 2020.  The final rule was published in the federal register on February 24, 2022 and went into effect 30 days later.  Under the final rule, a prescription can only be transferred once between pharmacies, and only if allowed under existing state or other applicable law. The prescription must remain in its electronic form; may not be altered in any way; and the transfer must be communicated directly between two licensed pharmacists. It's important to note, any authorized refills transfer with the original prescription, which means the entire prescription will be filled at the same pharmacy."    REFERENCES: 1. DEA website announcement https://www.dea.gov/stories/2023/2023-04/2022-09-01/revised-regulation-allows-dea-registered-pharmacies-transfer  2. Department of Justice website  https://www.govinfo.gov/content/pkg/FR-2022-02-24/pdf/2023-15847.pdf  3. DEPARTMENT OF JUSTICE Drug Enforcement Administration 21 CFR Part 1306 [Docket No. [id number]] RIN [id number] "Transfer of Electronic Prescriptions for Schedules II-V Controlled Substances Between Pharmacies for Initial Filling"  ____________________________________________________________________________________________     _______________________________________________________________________  Medication Rules  Purpose: To inform patients, and their family members, of our medication rules and regulations.  Applies to: All patients receiving prescriptions from our practice (written or electronic).  Pharmacy of record: This is the pharmacy where your electronic prescriptions will be sent. Make sure we have the correct one.  Electronic prescriptions: In  compliance with the [place] Strengthen Opioid Misuse Prevention (STOP) Act of 2017 (Session Law 2017-74/H243), effective August 01, 2018, all controlled substances must be electronically prescribed. Written prescriptions, faxing, or calling prescriptions to a pharmacy will no longer be done.  Prescription refills: These will be provided only during in-person appointments. No medications will be renewed without a "face-to-face" evaluation with your provider. Applies to all prescriptions.  NOTE: The following applies primarily to controlled substances (Opioid* Pain Medications).   Type of encounter (visit): For patients receiving controlled substances, face-to-face visits are required. (Not an option and not up to the patient.)  Patient's responsibilities: Pain Pills: Bring all pain pills to every appointment (except for procedure appointments). Pill Bottles: Bring pills in original pharmacy bottle. Bring bottle, even if empty. Always bring the bottle of the most recent fill.  Medication refills: You are responsible for knowing and keeping track of what medications you are taking and when is it that you will need a refill. The day before your appointment: write a list of all prescriptions that need to be refilled. The day of the appointment: give the list to the admitting nurse. Prescriptions will be written only during appointments. No prescriptions will be written on procedure days. If you forget a medication: it will not be "Called in", "Faxed", or "electronically sent". You will need to get another appointment to get these prescribed. No early refills. Do not call asking to have your prescription filled early. Partial  or short prescriptions: Occasionally your pharmacy may not have enough pills to fill your prescription.  NEVER ACCEPT a partial fill or a prescription that is short of the total amount of pills that you were prescribed.  With controlled substances the law allows 72 hours for  the pharmacy to complete the prescription.  If the prescription is not completed within 72 hours, the pharmacist will require a new prescription to be written. This means that you will be short on your medicine and we WILL NOT send another prescription to complete your original   prescription.  Instead, request the pharmacy to send a carrier to a nearby branch to get enough medication to provide you with your full prescription. Prescription Accuracy: You are responsible for carefully inspecting your prescriptions before leaving our office. Have the discharge nurse carefully go over each prescription with you, before taking them home. Make sure that your name is accurately spelled, that your address is correct. Check the name and dose of your medication to make sure it is accurate. Check the number of pills, and the written instructions to make sure they are clear and accurate. Make sure that you are given enough medication to last until your next medication refill appointment. Taking Medication: Take medication as prescribed. When it comes to controlled substances, taking less pills or less frequently than prescribed is permitted and encouraged. Never take more pills than instructed. Never take the medication more frequently than prescribed.  Inform other Doctors: Always inform, all of your healthcare providers, of all the medications you take. Pain Medication from other Providers: You are not allowed to accept any additional pain medication from any other Doctor or Healthcare provider. There are two exceptions to this rule. (see below) In the event that you require additional pain medication, you are responsible for notifying us, as stated below. Cough Medicine: Often these contain an opioid, such as codeine or hydrocodone. Never accept or take cough medicine containing these opioids if you are already taking an opioid* medication. The combination may cause respiratory failure and death. Medication Agreement:  You are responsible for carefully reading and following our Medication Agreement. This must be signed before receiving any prescriptions from our practice. Safely store a copy of your signed Agreement. Violations to the Agreement will result in no further prescriptions. (Additional copies of our Medication Agreement are available upon request.) Laws, Rules, & Regulations: All patients are expected to follow all Federal and State Laws, Statutes, Rules, & Regulations. Ignorance of the Laws does not constitute a valid excuse.  Illegal drugs and Controlled Substances: The use of illegal substances (including, but not limited to marijuana and its derivatives) and/or the illegal use of any controlled substances is strictly prohibited. Violation of this rule may result in the immediate and permanent discontinuation of any and all prescriptions being written by our practice. The use of any illegal substances is prohibited. Adopted CDC guidelines & recommendations: Target dosing levels will be at or below 60 MME/day. Use of benzodiazepines** is not recommended.  Exceptions: There are only two exceptions to the rule of not receiving pain medications from other Healthcare Providers. Exception #1 (Emergencies): In the event of an emergency (i.e.: accident requiring emergency care), you are allowed to receive additional pain medication. However, you are responsible for: As soon as you are able, call our office [phone], at any time of the day or night, and leave a message stating your name, the date and nature of the emergency, and the name and dose of the medication prescribed. In the event that your call is answered by a member of our staff, make sure to document and save the date, time, and the name of the person that took your information.  Exception #2 (Planned Surgery): In the event that you are scheduled by another doctor or dentist to have any type of surgery or procedure, you are allowed (for a period no  longer than 30 days), to receive additional pain medication, for the acute post-op pain. However, in this case, you are responsible for picking up a copy of   our "Post-op Pain Management for Surgeons" handout, and giving it to your surgeon or dentist. This document is available at our office, and does not require an appointment to obtain it. Simply go to our office during business hours (Monday-Thursday from 8:00 AM to 4:00 PM) (Friday 8:00 AM to 12:00 Noon) or if you have a scheduled appointment with us, prior to your surgery, and ask for it by name. In addition, you are responsible for: calling our office [phone], at any time of the day or night, and leaving a message stating your name, name of your surgeon, type of surgery, and date of procedure or surgery. Failure to comply with your responsibilities may result in termination of therapy involving the controlled substances. Medication Agreement Violation. Following the above rules, including your responsibilities will help you in avoiding a Medication Agreement Violation ("Breaking your Pain Medication Contract").  Consequences:  Not following the above rules may result in permanent discontinuation of medication prescription therapy.  *Opioid medications include: morphine, codeine, oxycodone, oxymorphone, hydrocodone, hydromorphone, meperidine, tramadol, tapentadol, buprenorphine, fentanyl, methadone. **Benzodiazepine medications include: diazepam (Valium), alprazolam (Xanax), clonazepam (Klonopine), lorazepam (Ativan), clorazepate (Tranxene), chlordiazepoxide (Librium), estazolam (Prosom), oxazepam (Serax), temazepam (Restoril), triazolam (Halcion) (Last updated: 05/24/2022) ______________________________________________________________________    ______________________________________________________________________  Medication Recommendations and Reminders  Applies to: All patients receiving prescriptions (written and/or  electronic).  Medication Rules & Regulations: You are responsible for reading, knowing, and following our "Medication Rules" document. These exist for your safety and that of others. They are not flexible and neither are we. Dismissing or ignoring them is an act of "non-compliance" that may result in complete and irreversible termination of such medication therapy. For safety reasons, "non-compliance" will not be tolerated. As with the [place] fundamental legal principle of "ignorance of the law is no defense", we will accept no excuses for not having read and knowing the content of documents provided to you by our practice.  Pharmacy of record:  Definition: This is the pharmacy where your electronic prescriptions will be sent.  We do not endorse any particular pharmacy. It is up to you and your insurance to decide what pharmacy to use.  We do not restrict you in your choice of pharmacy. However, once we write for your prescriptions, we will NOT be re-sending more prescriptions to fix restricted supply problems created by your pharmacy, or your insurance.  The pharmacy listed in the electronic medical record should be the one where you want electronic prescriptions to be sent. If you choose to change pharmacy, simply notify our nursing staff. Changes will be made only during your regular appointments and not over the phone.  Recommendations: Keep all of your pain medications in a safe place, under lock and key, even if you live alone. We will NOT replace lost, stolen, or damaged medication. We do not accept "Police Reports" as proof of medications having been stolen. After you fill your prescription, take 1 week's worth of pills and put them away in a safe place. You should keep a separate, properly labeled bottle for this purpose. The remainder should be kept in the original bottle. Use this as your primary supply, until it runs out. Once it's gone, then you know that you have 1 week's worth of medicine,  and it is time to come in for a prescription refill. If you do this correctly, it is unlikely that you will ever run out of medicine. To make sure that the above recommendation works, it is very important that you make   sure your medication refill appointments are scheduled at least 1 week before you run out of medicine. To do this in an effective manner, make sure that you do not leave the office without scheduling your next medication management appointment. Always ask the nursing staff to show you in your prescription , when your medication will be running out. Then arrange for the receptionist to get you a return appointment, at least 7 days before you run out of medicine. Do not wait until you have 1 or 2 pills left, to come in. This is very poor planning and does not take into consideration that we may need to cancel appointments due to bad weather, sickness, or emergencies affecting our staff. DO NOT ACCEPT A "Partial Fill": If for any reason your pharmacy does not have enough pills/tablets to completely fill or refill your prescription, do not allow for a "partial fill". The law allows the pharmacy to complete that prescription within 72 hours, without requiring a new prescription. If they do not fill the rest of your prescription within those 72 hours, you will need a separate prescription to fill the remaining amount, which we will NOT provide. If the reason for the partial fill is your insurance, you will need to talk to the pharmacist about payment alternatives for the remaining tablets, but again, DO NOT ACCEPT A PARTIAL FILL, unless you can trust your pharmacist to obtain the remainder of the pills within 72 hours.  Prescription refills and/or changes in medication(s):  Prescription refills, and/or changes in dose or medication, will be conducted only during scheduled medication management appointments. (Applies to both, written and electronic prescriptions.) No refills on procedure days. No  medication will be changed or started on procedure days. No changes, adjustments, and/or refills will be conducted on a procedure day. Doing so will interfere with the diagnostic portion of the procedure. No phone refills. No medications will be "called into the pharmacy". No Fax refills. No weekend refills. No Holliday refills. No after hours refills.  Remember:  Business hours are:  Monday to Thursday 8:00 AM to 4:00 PM Provider's Schedule: Malon Branton, MD - Appointments are:  Medication management: Monday and Wednesday 8:00 AM to 4:00 PM Procedure day: Tuesday and Thursday 7:30 AM to 4:00 PM Bilal Lateef, MD - Appointments are:  Medication management: Tuesday and Thursday 8:00 AM to 4:00 PM Procedure day: Monday and Wednesday 7:30 AM to 4:00 PM (Last update: 05/24/2022) ______________________________________________________________________    ____________________________________________________________________________________________  Drug Holidays  What is a "Drug Holiday"? Drug Holiday: is the name given to the process of slowly tapering down and temporarily stopping the pain medication for the purpose of decreasing or eliminating tolerance to the drug.  Benefits Improved effectiveness Decreased required effective dose Improved pain control End dependence on high dose therapy Decrease cost of therapy Uncovering "opioid-induced hyperalgesia". (OIH)  What is "opioid hyperalgesia"? It is a paradoxical increase in pain caused by exposure to opioids. Stopping the opioid pain medication, contrary to the expected, it actually decreases or completely eliminates the pain. Ref.: "A comprehensive review of opioid-induced hyperalgesia". Marion Lee, et.al. Pain Physician. 2011 Mar-Apr;14(2):145-61.  What is tolerance? Tolerance: the progressive loss of effectiveness of a pain medicine due to repetitive use. A common problem of opioid pain medications.  How long should a "Drug  Holiday" last? Effectiveness depends on the patient staying off all opioid pain medicines for a minimum of 14 consecutive days. (2 weeks)  How about just taking less of the medicine? Does not   work. Will not accomplish goal of eliminating the excess receptors.  How about switching to a different pain medicine? (AKA. "Opioid rotation") Does not work. Creates the illusion of effectiveness by taking advantage of inaccurate equivalent dose calculations between different opioids. -This "technique" was promoted by studies funded by pharmaceutical companies, such as [employer], creators of "OxyContin".  Can I stop the medicine "cold turkey"? We do not recommend it. You should always coordinate with your prescribing physician to make the transition as smoothly as possible. Avoid stopping the medicine abruptly without consulting. We recommend a "slow taper".  What is a slow taper? Taper: refers to the gradual decrease in dose.   How do I stop/taper the dose? Slowly. Decrease the daily amount of pills that you take by one (1) pill every seven (7) days. This is called a "slow downward taper". Example: if you normally take four (4) pills per day, drop it to three (3) pills per day for seven (7) days, then to two (2) pills per day for seven (7) days, then to one (1) per day for seven (7) days, and then stop the medicine. The 14 day "Drug Holiday" starts on the first day without medicine.   Will I experience withdrawals? Unlikely with a slow taper.  What triggers withdrawals? Withdrawals are triggered by the sudden/abrupt stop of high dose opioids. Withdrawals can be avoided by slowly decreasing the dose over a prolonged period of time.  What are withdrawals? Symptoms associated with sudden/abrupt reduction/stopping of high-dose, long-term use of pain medication. Withdrawal are seldom seen on low dose therapy, or patients rarely taking opioid medication.  Early Withdrawal Symptoms may  include: Agitation Anxiety Muscle aches Increased tearing Insomnia Runny nose Sweating Yawning  Late symptoms may include: Abdominal cramping Diarrhea Dilated pupils Goose bumps Nausea Vomiting  When could I see withdrawals? Onset: 8-24 hours after last use for most opioids. 12-48 hours for long-acting opioids (i.e.: methadone)  How long could they last? Duration: 4-10 days for most opioids. 14-21 days for long-acting opioids (i.e.: methadone)  What will happen after I complete my "Drug Holiday"? The need and indications for the opioid analgesic will be reviewed before restarting the medication. Dose requirements will likely decrease and the dose will need to be adjusted accordingly.   (Last update: 10/19/2022) ____________________________________________________________________________________________    ____________________________________________________________________________________________  WARNING: CBD (cannabidiol) & Delta (Delta-8 tetrahydrocannabinol) products.   Applicable to:  All individuals currently taking or considering taking CBD (cannabidiol) and, more important, all patients taking opioid analgesic controlled substances (pain medication). (Example: oxycodone; oxymorphone; hydrocodone; hydromorphone; morphine; methadone; tramadol; tapentadol; fentanyl; buprenorphine; butorphanol; dextromethorphan; meperidine; codeine; etc.)  Introduction:  Recently there has been a drive towards the use of "natural" products for the treatment of different conditions, including pain anxiety and sleep disorders. Marijuana and hemp are two varieties of the cannabis genus plants. Marijuana and its derivatives are illegal, while hemp and its derivatives are not. Cannabidiol (CBD) and tetrahydrocannabinol (THC), are two natural compounds found in plants of the Cannabis genus. They can both be extracted from hemp or marijuana. Both compounds interact with your body's endocannabinoid  system in very different ways. CBD is associated with pain relief (analgesia) while THC is associated with the psychoactive effects ("the high") obtained from the use of marijuana products. There are two main types of THC: Delta-9, which comes from the marijuana plant and it is illegal, and Delta-8, which comes from the hemp plant, and it is legal. (Both, Delta-9-THC and Delta-8-THC are psychoactive and   give you "the high".)   Legality:  Marijuana and its derivatives: illegal Hemp and its derivatives: Legal (State dependent) UPDATE: (09/17/2021) The Drug Enforcement Agency (DEA) issued a letter stating that "delta" cannabinoids, including Delta-8-THCO and Delta-9-THCO, synthetically derived from hemp do not qualify as hemp and will be viewed as Schedule I drugs. (Schedule I drugs, substances, or chemicals are defined as drugs with no currently accepted medical use and a high potential for abuse. Some examples of Schedule I drugs are: heroin, lysergic acid diethylamide (LSD), marijuana (cannabis), 3,4-methylenedioxymethamphetamine (ecstasy), methaqualone, and peyote.) (https://www.dea.gov)  Legal status of CBD in [place]:  "Conditionally Legal"  Reference: "FDA Regulation of Cannabis and Cannabis-Derived Products, Including Cannabidiol (CBD)" - https://www.fda.gov/news-events/public-health-focus/fda-regulation-cannabis-and-cannabis-derived-products-including-cannabidiol-cbd  Warning:  CBD is not FDA approved and has not undergo the same manufacturing controls as prescription drugs.  This means that the purity and safety of available CBD may be questionable. Most of the time, despite manufacturer's claims, it is contaminated with THC (delta-9-tetrahydrocannabinol - the chemical in marijuana responsible for the "HIGH").  When this is the case, the THC contaminant will trigger a positive urine drug screen (UDS) test for Marijuana (carboxy-THC).   The FDA recently put out a warning about 5 things that everyone  should be aware of regarding Delta-8 THC: Delta-8 THC products have not been evaluated or approved by the FDA for safe use and may be marketed in ways that put the public health at risk. The FDA has received adverse event reports involving delta-8 THC-containing products. Delta-8 THC has psychoactive and intoxicating effects. Delta-8 THC manufacturing often involve use of potentially harmful chemicals to create the concentrations of delta-8 THC claimed in the marketplace. The final delta-8 THC product may have potentially harmful by-products (contaminants) due to the chemicals used in the process. Manufacturing of delta-8 THC products may occur in uncontrolled or unsanitary settings, which may lead to the presence of unsafe contaminants or other potentially harmful substances. Delta-8 THC products should be kept out of the reach of children and pets.  NOTE: Because a positive UDS for any illicit substance is a violation of our medication agreement, your opioid analgesics (pain medicine) may be permanently discontinued.  MORE ABOUT CBD  General Information: CBD was discovered in 1940 and it is a derivative of the cannabis sativa genus plants (Marijuana and Hemp). It is one of the 113 identified substances found in Marijuana. It accounts for up to 40% of the plant's extract. As of 2018, preliminary clinical studies on CBD included research for the treatment of anxiety, movement disorders, and pain. CBD is available and consumed in multiple forms, including inhalation of smoke or vapor, as an aerosol spray, and by mouth. It may be supplied as an oil containing CBD, capsules, dried cannabis, or as a liquid solution. CBD is thought not to be as psychoactive as THC (delta-9-tetrahydrocannabinol - the chemical in marijuana responsible for the "HIGH"). Studies suggest that CBD may interact with different biological target receptors in the body, including cannabinoid and other neurotransmitter receptors. As of  2018 the mechanism of action for its biological effects has not been determined.  Side-effects  Adverse reactions: Dry mouth, diarrhea, decreased appetite, fatigue, drowsiness, malaise, weakness, sleep disturbances, and others.  Drug interactions:  CBD may interact with medications such as blood-thinners. CBD causes drowsiness on its own and it will increase drowsiness caused by other medications, including antihistamines (such as Benadryl), benzodiazepines (Xanax, Ativan, Valium), antipsychotics, antidepressants, opioids, alcohol and supplements such as kava, melatonin and St. John's Wort.    Other drug interactions: Brivaracetam (Briviact); Caffeine; Carbamazepine (Tegretol); Citalopram (Celexa); Clobazam (Onfi); Eslicarbazepine (Aptiom); Everolimus (Zostress); Lithium; Methadone (Dolophine); Rufinamide (Banzel); Sedative medications (CNS depressants); Sirolimus (Rapamune); Stiripentol (Diacomit); Tacrolimus (Prograf); Tamoxifen ; Soltamox); Topiramate (Topamax); Valproate; Warfarin (Coumadin); Zonisamide. (Last update: 07/11/2022) ____________________________________________________________________________________________   ____________________________________________________________________________________________  Naloxone Nasal Spray  Why am I receiving this medication? [place] STOP ACT requires that all patients taking high dose opioids or at risk of opioids respiratory depression, be prescribed an opioid reversal agent, such as Naloxone (AKA: Narcan).  What is this medication? NALOXONE (nal OX one) treats opioid overdose, which causes slow or shallow breathing, severe drowsiness, or trouble staying awake. Call emergency services after using this medication. You may need additional treatment. Naloxone works by reversing the effects of opioids. It belongs to a group of medications called opioid blockers.  COMMON BRAND NAME(S): Kloxxado, Narcan  What should I tell my care team before  I take this medication? They need to know if you have any of these conditions: Heart disease Substance use disorder An unusual or allergic reaction to naloxone, other medications, foods, dyes, or preservatives Pregnant or trying to get pregnant Breast-feeding  When to use this medication? This medication is to be used for the treatment of respiratory depression (less than 8 breaths per minute) secondary to opioid overdose.   How to use this medication? This medication is for use in the nose. Lay the person on their back. Support their neck with your hand and allow the head to tilt back before giving the medication. The nasal spray should be given into 1 nostril. After giving the medication, move the person onto their side. Do not remove or test the nasal spray until ready to use. Get emergency medical help right away after giving the first dose of this medication, even if the person wakes up. You should be familiar with how to recognize the signs and symptoms of a narcotic overdose. If more doses are needed, give the additional dose in the other nostril. Talk to your care team about the use of this medication in children. While this medication may be prescribed for children as young as newborns for selected conditions, precautions do apply.  Naloxone Overdosage: If you think you have taken too much of this medicine contact a poison control center or emergency room at once.  NOTE: This medicine is only for you. Do not share this medicine with others.  What if I miss a dose? This does not apply.  What may interact with this medication? This is only used during an emergency. No interactions are expected during emergency use. This list may not describe all possible interactions. Give your health care provider a list of all the medicines, herbs, non-prescription drugs, or dietary supplements you use. Also tell them if you smoke, drink alcohol, or use illegal drugs. Some items may interact with  your medicine.  What should I watch for while using this medication? Keep this medication ready for use in the case of an opioid overdose. Make sure that you have the phone number of your care team and local hospital ready. You may need to have additional doses of this medication. Each nasal spray contains a single dose. Some emergencies may require additional doses. After use, bring the treated person to the nearest hospital or call 911. Make sure the treating care team knows that the person has received a dose of this medication. You will receive additional instructions on what to do during and after use of this   medication before an emergency occurs.  What side effects may I notice from receiving this medication? Side effects that you should report to your care team as soon as possible: Allergic reactions--skin rash, itching, hives, swelling of the face, lips, tongue, or throat Side effects that usually do not require medical attention (report these to your care team if they continue or are bothersome): Constipation Dryness or irritation inside the nose Headache Increase in blood pressure Muscle spasms Stuffy nose Toothache This list may not describe all possible side effects. Call your doctor for medical advice about side effects. You may report side effects to FDA at 1-800-FDA-1088.  Where should I keep my medication? Because this is an emergency medication, you should keep it with you at all times.  Keep out of the reach of children and pets. Store between 20 and 25 degrees C (68 and 77 degrees F). Do not freeze. Throw away any unused medication after the expiration date. Keep in original box until ready to use.  NOTE: This sheet is a summary. It may not cover all possible information. If you have questions about this medicine, talk to your doctor, pharmacist, or health care provider.   2023 Elsevier/Gold Standard (2021-03-26  00:00:00)  ____________________________________________________________________________________________   

## 2022-12-05 NOTE — Progress Notes (Signed)
Safety precautions to be maintained throughout the outpatient stay will include: orient to surroundings, keep bed in low position, maintain call bell within reach at all times, provide assistance with transfer out of bed and ambulation.   Nursing Pain Medication Assessment:  Safety precautions to be maintained throughout the outpatient stay will include: orient to surroundings, keep bed in low position, maintain call bell within reach at all times, provide assistance with transfer out of bed and ambulation.  Medication Inspection Compliance: Pill count conducted under aseptic conditions, in front of the patient. Neither the pills nor the bottle was removed from the patient's sight at any time. Once count was completed pills were immediately returned to the patient in their original bottle.  Medication: Hydrocodone/APAP Pill/Patch Count:  72 of 90 pills remain Pill/Patch Appearance: Markings consistent with prescribed medication Bottle Appearance: Standard pharmacy container. Clearly labeled. Filled Date: 05 / 02 / 2024 Last Medication intake:  Today

## 2022-12-26 ENCOUNTER — Other Ambulatory Visit: Payer: Self-pay | Admitting: Nurse Practitioner

## 2022-12-26 DIAGNOSIS — E559 Vitamin D deficiency, unspecified: Secondary | ICD-10-CM

## 2022-12-29 MED FILL — Azelastine HCl Nasal Spray 0.1% (137 MCG/SPRAY): NASAL | 30 days supply | Qty: 30 | Fill #1 | Status: AC

## 2023-01-23 NOTE — Progress Notes (Unsigned)
PROVIDER NOTE: Information contained herein reflects review and annotations entered in association with encounter. Interpretation of such information and data should be left to medically-trained personnel. Information provided to patient can be located elsewhere in the medical record under "Patient Instructions". Document created using STT-dictation technology, any transcriptional errors that may result from process are unintentional.    Patient: Denise Burns  Service Category: E/M  Provider: Oswaldo Done, MD  DOB: 01/06/1971  DOS: 01/25/2023  Referring Provider: Carlean Jews, NP  MRN: [id number]  Specialty: Interventional Pain Management  PCP: Carlean Jews, NP  Type: Established Patient  Setting: Ambulatory outpatient    Location: Office  Delivery: Face-to-face     HPI  [name], a 52 y.o. year old female, is here today because of her No primary diagnosis found.. [name]'s primary complain today is No chief complaint on file.  Pertinent problems: Ms. Pagett has Chronic midline low back pain with bilateral sciatica; Chronic low back pain (1ry area of Pain) (Bilateral) (R>L) w/o sciatica; Chronic sacroiliac joint pain (2ry area of Pain) (Right); Chronic pain syndrome; Abnormal MRI, lumbar spine (03/22/2018); Lumbar facet hypertrophy (Multilevel) (Bilateral); Lumbar facet syndrome (Bilateral); DDD (degenerative disc disease), lumbosacral; Spondylolisthesis (8 mm) at L5-S1 level; Lumbar Grade 1 Retrolisthesis (2 mm) of L2/L3; L5-S1 pars defect w/ spondylolisthesis (Bilateral); Lumbar pars defect (L5-S1) (Bilateral); Lumbar foraminal stenosis (Left: L2-3) (Bilateral: L5-S1); Osteoarthritis of sacroiliac joint (Bilateral); Chronic musculoskeletal pain; Neurogenic pain; Osteoarthritis of facet joint of lumbar spine; Greater trochanteric bursitis (Bilateral); Lumbar spondylosis; Spondylosis without myelopathy or radiculopathy, lumbosacral region; Chronic hip pain (Right);  Osteoarthritis of hip (Right); Lumbar radiculitis (L1/L2) (Right); Chronic groin pain (Right); Chronic hip pain (Bilateral); Enthesopathy hip region (Left); Osteoarthritis of hips (Bilateral); Kissing spine syndrome; Chronic low back pain (Midline) w/o sciatica; and Trigger point with back pain (PSIS area) (Right) on their pertinent problem list. Pain Assessment: Severity of   is reported as a  /10. Location:    / . Onset:  . Quality:  . Timing:  . Modifying factor(s):  Marland Kitchen Vitals:  vitals were not taken for this visit.  BMI: Estimated body mass index is 30.9 kg/m as calculated from the following:   Height as of 12/05/22: 5\' 4"  (1.626 m).   Weight as of 12/05/22: 180 lb (81.6 kg). Last encounter: 12/05/2022. Last procedure: 09/06/2022.  Reason for encounter:  *** . ***  Pharmacotherapy Assessment  Analgesic: Hydrocodone/APAP 7.5/325 1 tablet p.o. 3 times daily (22.5 mg/day of hydrocodone)  MME/day: 22.5 mg/day.   Monitoring:  PMP: PDMP reviewed during this encounter.       Pharmacotherapy: No side-effects or adverse reactions reported. Compliance: No problems identified. Effectiveness: Clinically acceptable.  No notes on file  No results found for: "CBDTHCR" No results found for: "D8THCCBX" No results found for: "D9THCCBX"  UDS:  Summary  Date Value Ref Range Status  03/28/2022 Note  Final    Comment:    ==================================================================== [employer] (MW) ==================================================================== Test                             Result       Flag       Units  Drug Present and Declared for Prescription Verification   Hydrocodone                    2442         EXPECTED   ng/mg  creat   Hydromorphone                  467          EXPECTED   ng/mg creat   Dihydrocodeine                 227          EXPECTED   ng/mg creat   Norhydrocodone                 2628         EXPECTED   ng/mg creat    Sources of hydrocodone  include scheduled prescription medications.    Hydromorphone, dihydrocodeine and norhydrocodone are expected    metabolites of hydrocodone. Hydromorphone and dihydrocodeine are    also available as scheduled prescription medications.  ==================================================================== Test                      Result    Flag   Units      Ref Range   Creatinine              105              mg/dL      >=78 ==================================================================== Declared Medications:  The flagging and interpretation on this report are based on the  following declared medications.  Unexpected results may arise from  inaccuracies in the declared medications.   **Note: The testing scope of this panel includes these medications:   Hydrocodone (Norco)   **Note: The testing scope of this panel does not include the  following reported medications:   Acetaminophen (Norco)  Azelastine (Astelin)  Fludrocortisone (Florinef)  Fluticasone (Flonase)  Hydrocortisone (Cortef)  Hyoscyamine  Meloxicam (Mobic)  Methenamine  Methylene Blue  Pseudoephedrine (Sudafed)  Sodium Biphosphate ==================================================================== For clinical consultation, please call [phone]. ====================================================================       ROS  Constitutional: Denies any fever or chills Gastrointestinal: No reported hemesis, hematochezia, vomiting, or acute GI distress Musculoskeletal: Denies any acute onset joint swelling, redness, loss of ROM, or weakness Neurological: No reported episodes of acute onset apraxia, aphasia, dysarthria, agnosia, amnesia, paralysis, loss of coordination, or loss of consciousness  Medication Review  Azelastine HCl, HYDROcodone-acetaminophen, Urogesic-Blue, cetirizine, ergocalciferol, escitalopram, fludrocortisone, fluticasone, hydrocortisone, lansoprazole, meloxicam, naloxone, omega-3 acid  ethyl esters, and pseudoephedrine  History Review  Allergy: Ms. Staton is allergic to sulfacetamide sodium (acne), tramadol, lisinopril, and sulfa antibiotics. Drug: Ms. Colan  reports no history of drug use. Alcohol:  reports that she does not currently use alcohol. Tobacco:  reports that she quit smoking about 28 years ago. Her smoking use included cigarettes. She has never used smokeless tobacco. Social: Ms. Cuneo  reports that she quit smoking about 28 years ago. Her smoking use included cigarettes. She has never used smokeless tobacco. She reports that she does not currently use alcohol. She reports that she does not use drugs. Medical:  has a past medical history of Addison disease (HCC), Arthritis, Back pain, Complication of anesthesia, GERD (gastroesophageal reflux disease), and History of kidney stones. Surgical: Ms. Blake  has a past surgical history that includes Cholecystectomy (2010); Polypectomy; dental work; Dilation and curettage of uterus (2010); IR NEPHROSTOMY PLACEMENT LEFT (12/14/2020); Nephrolithotomy (Left, 12/14/2020); Wisdom tooth extraction; Diagnostic laparoscopy (2004); Cystoscopy/ureteroscopy/holmium laser/stent placement (Right, 06/28/2021); Cystoscopy w/ ureteral stent removal (Right, 08/09/2021); Cystoscopy w/ retrogrades (Right, 08/09/2021); Cystoscopy/ureteroscopy/holmium laser/stent placement (Right, 08/09/2021); and Colonoscopy with propofol (N/A, 06/08/2022). Family: family history includes Alcohol  abuse in her father; Arthritis in her mother; Brain cancer in her mother; Colon polyps (age of onset: 39) in her maternal grandmother; Hearing loss in her father; High blood pressure in her father and mother; Hyperlipidemia in her father and mother; Hypertension in her mother; Liver cancer in her mother; Lung cancer in her mother.  Laboratory Chemistry Profile   Renal Lab Results  Component Value Date   BUN 22 (H) 09/18/2022   CREATININE 0.72 09/18/2022   BCR 15  06/15/2022   GFR 84.36 08/26/2021   GFRAA 86 08/16/2018   GFRNONAA >60 09/18/2022    Hepatic Lab Results  Component Value Date   AST 36 09/16/2022   ALT 36 09/16/2022   ALBUMIN 4.1 09/16/2022   ALKPHOS 63 09/16/2022   LIPASE 38 09/16/2022    Electrolytes Lab Results  Component Value Date   NA 140 09/18/2022   K 4.6 09/18/2022   CL 108 09/18/2022   CALCIUM 8.7 (L) 09/18/2022   MG 2.2 09/18/2022   PHOS 4.3 09/18/2022    Bone Lab Results  Component Value Date   VD25OH 14.0 (L) 06/15/2022   25OHVITD1 67 08/16/2018   25OHVITD2 <1.0 08/16/2018   25OHVITD3 67 08/16/2018   TESTOSTERONE 1 (L) 08/13/2020    Inflammation (CRP: Acute Phase) (ESR: Chronic Phase) Lab Results  Component Value Date   CRP 2 08/16/2018   ESRSEDRATE 37 (H) 08/16/2018   LATICACIDVEN 1.9 09/16/2022         Note: Above Lab results reviewed.  Recent Imaging Review  US RENAL CLINICAL DATA:  Right hydronephrosis  EXAM: RENAL / URINARY TRACT ULTRASOUND COMPLETE  COMPARISON:  CT abdomen pelvis 09/16/2022  FINDINGS: Right Kidney:  Renal measurements: 11.8 x 5.4 x 5.8 cm = volume: 194 mL. Echogenicity within normal limits. No mass or hydronephrosis visualized.  Left Kidney:  Renal measurements: 12.6 x 5.7 x 5.3 cm = volume: 199 mL. Echogenicity within normal limits. No mass or hydronephrosis visualized.  Bladder:  Appears normal for degree of bladder distention.  Other:  None.  IMPRESSION: No hydronephrosis. No significant sonographic abnormality of the kidneys.  Electronically Signed   By: Acquanetta Belling M.D.   On: 09/17/2022 17:32 Note: Reviewed        Physical Exam  General appearance: Well nourished, well developed, and well hydrated. In no apparent acute distress Mental status: Alert, oriented x 3 (person, place, & time)       Respiratory: No evidence of acute respiratory distress Eyes: PERLA Vitals: LMP 12/01/2020 (Approximate)  BMI: Estimated body mass index is 30.9  kg/m as calculated from the following:   Height as of 12/05/22: 5\' 4"  (1.626 m).   Weight as of 12/05/22: 180 lb (81.6 kg). Ideal: Patient weight not recorded  Assessment   Diagnosis Status  No diagnosis found. Controlled Controlled Controlled   Updated Problems: No problems updated.  Plan of Care  Problem-specific:  No problem-specific Assessment & Plan notes found for this encounter.  Ms. Rickey Farrier has a current medication list which includes the following long-term medication(s): azelastine hcl, escitalopram, flonase sensimist, hydrocodone-acetaminophen, hydrocodone-acetaminophen, [START ON 01/28/2023] hydrocodone-acetaminophen, [START ON 02/27/2023] hydrocodone-acetaminophen, lansoprazole, meloxicam, and omega-3 acid ethyl esters.  Pharmacotherapy (Medications Ordered): No orders of the defined types were placed in this encounter.  Orders:  No orders of the defined types were placed in this encounter.  Follow-up plan:   No follow-ups on file.      Interventional Therapies  Risk  Complexity Considerations:   WNL  Planned  Pending:      Under consideration:      Completed:   Therapeutic bilateral IA hip injection (posterolateral approach) R3L2 (09/06/2022) (100/100/90/90)  Therapeutic bilateral lumbar facet MBB x3 (09/08/2020) (4-0) (100/100/60/60)  Therapeutic right lumbar facet RFA x2 (09/09/2021) (4-2) (100/100/90/>75)  Therapeutic left lumbar facet RFA x2 (10/07/2021) (4-0) (100/100/90/>75)  Therapeutic right IA hip inj. x2 (08/20/2020) (5-1) (100/100/80/80)  Therapeutic left IA hip inj. x1 (08/20/2020) (5-1) (100/100/80/80)  Therapeutic bilateral trochanteric bursa inj. x3 (04/07/2022)  (100/100/90/90)    Therapeutic  Palliative (PRN) options:   Palliative lumbar facet MBB   Palliative lumbar facet RFA  Palliative trochanteric bursa inj.    Pharmacotherapy  Nonopioids transferred 05/11/2020: Baclofen and Mobic          Recent Visits Date  Type Provider Dept  12/05/22 Office Visit Delano Metz, MD [facility]  Showing recent visits within past 90 days and meeting all other requirements Future Appointments Date Type Provider Dept  01/25/23 Appointment Delano Metz, MD [facility]  03/22/23 Appointment Delano Metz, MD [facility]  Showing future appointments within next 90 days and meeting all other requirements  I discussed the assessment and treatment plan with the patient. The patient was provided an opportunity to ask questions and all were answered. The patient agreed with the plan and demonstrated an understanding of the instructions.  Patient advised to call back or seek an in-person evaluation if the symptoms or condition worsens.  Duration of encounter: *** minutes.  Total time on encounter, as per AMA guidelines included both the face-to-face and non-face-to-face time personally spent by the physician and/or other qualified health care professional(s) on the day of the encounter (includes time in activities that require the physician or other qualified health care professional and does not include time in activities normally performed by clinical staff). Physician's time may include the following activities when performed: Preparing to see the patient (e.g., pre-charting review of records, searching for previously ordered imaging, lab work, and nerve conduction tests) Review of prior analgesic pharmacotherapies. Reviewing PMP Interpreting ordered tests (e.g., lab work, imaging, nerve conduction tests) Performing post-procedure evaluations, including interpretation of diagnostic procedures Obtaining and/or reviewing separately obtained history Performing a medically appropriate examination and/or evaluation Counseling and educating the patient/family/caregiver Ordering medications, tests, or procedures Referring and communicating with other health care professionals (when  not separately reported) Documenting clinical information in the electronic or other health record Independently interpreting results (not separately reported) and communicating results to the patient/ family/caregiver Care coordination (not separately reported)  Note by: Oswaldo Done, MD Date: 01/25/2023; Time: 5:49 AM

## 2023-01-25 ENCOUNTER — Ambulatory Visit: Payer: BC Managed Care – PPO | Attending: Pain Medicine | Admitting: Pain Medicine

## 2023-01-25 ENCOUNTER — Encounter: Payer: Self-pay | Admitting: Pain Medicine

## 2023-01-25 VITALS — BP 146/96 | HR 83 | Temp 97.4°F | Resp 16 | Ht 64.0 in | Wt 180.0 lb

## 2023-01-25 DIAGNOSIS — M4306 Spondylolysis, lumbar region: Secondary | ICD-10-CM

## 2023-01-25 DIAGNOSIS — M7062 Trochanteric bursitis, left hip: Secondary | ICD-10-CM | POA: Insufficient documentation

## 2023-01-25 DIAGNOSIS — G8929 Other chronic pain: Secondary | ICD-10-CM | POA: Diagnosis not present

## 2023-01-25 DIAGNOSIS — M5459 Other low back pain: Secondary | ICD-10-CM

## 2023-01-25 DIAGNOSIS — M25552 Pain in left hip: Secondary | ICD-10-CM | POA: Diagnosis not present

## 2023-01-25 DIAGNOSIS — M5137 Other intervertebral disc degeneration, lumbosacral region: Secondary | ICD-10-CM | POA: Insufficient documentation

## 2023-01-25 DIAGNOSIS — M25551 Pain in right hip: Secondary | ICD-10-CM | POA: Insufficient documentation

## 2023-01-25 DIAGNOSIS — M47816 Spondylosis without myelopathy or radiculopathy, lumbar region: Secondary | ICD-10-CM

## 2023-01-25 DIAGNOSIS — M7061 Trochanteric bursitis, right hip: Secondary | ICD-10-CM | POA: Diagnosis not present

## 2023-01-25 DIAGNOSIS — M4310 Spondylolisthesis, site unspecified: Secondary | ICD-10-CM

## 2023-01-25 DIAGNOSIS — M545 Low back pain, unspecified: Secondary | ICD-10-CM | POA: Diagnosis not present

## 2023-01-25 DIAGNOSIS — M4317 Spondylolisthesis, lumbosacral region: Secondary | ICD-10-CM | POA: Diagnosis not present

## 2023-01-25 DIAGNOSIS — M51379 Other intervertebral disc degeneration, lumbosacral region without mention of lumbar back pain or lower extremity pain: Secondary | ICD-10-CM

## 2023-01-25 NOTE — Patient Instructions (Signed)

## 2023-01-25 NOTE — Progress Notes (Signed)
Safety precautions to be maintained throughout the outpatient stay will include: orient to surroundings, keep bed in low position, maintain call bell within reach at all times, provide assistance with transfer out of bed and ambulation.  

## 2023-01-29 ENCOUNTER — Other Ambulatory Visit: Payer: Self-pay

## 2023-01-29 ENCOUNTER — Other Ambulatory Visit: Payer: Self-pay | Admitting: Nurse Practitioner

## 2023-01-30 ENCOUNTER — Other Ambulatory Visit: Payer: Self-pay

## 2023-01-30 MED FILL — Azelastine HCl Nasal Spray 0.1% (137 MCG/SPRAY): NASAL | 30 days supply | Qty: 30 | Fill #0 | Status: AC

## 2023-01-31 ENCOUNTER — Other Ambulatory Visit: Payer: Self-pay

## 2023-02-06 ENCOUNTER — Telehealth: Payer: Self-pay

## 2023-02-06 NOTE — Telephone Encounter (Signed)
Please order RFA

## 2023-02-06 NOTE — Telephone Encounter (Addendum)
Her insurance is stating that they cannot approve a lumbar facet block since she has had an RFA. Their words: The notes show you had a RFA in the past. The type of injection your doctor wants to give you should not be given if you have had RFA treatment to this area in the past. For this reason this injection is not medically necessary.    I spoke to the patient and she is willing to just move forth with an RFA. Do you want to order RFA?

## 2023-02-07 ENCOUNTER — Other Ambulatory Visit: Payer: Self-pay | Admitting: Pain Medicine

## 2023-02-07 DIAGNOSIS — M5459 Other low back pain: Secondary | ICD-10-CM

## 2023-02-07 DIAGNOSIS — M47816 Spondylosis without myelopathy or radiculopathy, lumbar region: Secondary | ICD-10-CM

## 2023-02-07 DIAGNOSIS — M545 Low back pain, unspecified: Secondary | ICD-10-CM

## 2023-02-17 MED FILL — Escitalopram Oxalate Tab 5 MG (Base Equiv): ORAL | 90 days supply | Qty: 90 | Fill #1 | Status: AC

## 2023-02-21 ENCOUNTER — Ambulatory Visit: Payer: BC Managed Care – PPO | Attending: Pain Medicine | Admitting: Pain Medicine

## 2023-02-21 ENCOUNTER — Encounter: Payer: Self-pay | Admitting: Pain Medicine

## 2023-02-21 ENCOUNTER — Ambulatory Visit: Admission: RE | Admit: 2023-02-21 | Payer: BC Managed Care – PPO | Source: Ambulatory Visit

## 2023-02-21 ENCOUNTER — Other Ambulatory Visit: Payer: Self-pay

## 2023-02-21 VITALS — BP 140/79 | HR 76 | Temp 97.1°F | Resp 16 | Ht 64.0 in | Wt 179.0 lb

## 2023-02-21 DIAGNOSIS — M47817 Spondylosis without myelopathy or radiculopathy, lumbosacral region: Secondary | ICD-10-CM | POA: Insufficient documentation

## 2023-02-21 DIAGNOSIS — M25551 Pain in right hip: Secondary | ICD-10-CM | POA: Insufficient documentation

## 2023-02-21 DIAGNOSIS — M5137 Other intervertebral disc degeneration, lumbosacral region: Secondary | ICD-10-CM | POA: Insufficient documentation

## 2023-02-21 DIAGNOSIS — M47816 Spondylosis without myelopathy or radiculopathy, lumbar region: Secondary | ICD-10-CM | POA: Insufficient documentation

## 2023-02-21 DIAGNOSIS — M25552 Pain in left hip: Secondary | ICD-10-CM | POA: Insufficient documentation

## 2023-02-21 DIAGNOSIS — M545 Low back pain, unspecified: Secondary | ICD-10-CM | POA: Diagnosis not present

## 2023-02-21 DIAGNOSIS — M4310 Spondylolisthesis, site unspecified: Secondary | ICD-10-CM | POA: Insufficient documentation

## 2023-02-21 DIAGNOSIS — M5459 Other low back pain: Secondary | ICD-10-CM | POA: Diagnosis not present

## 2023-02-21 DIAGNOSIS — G8918 Other acute postprocedural pain: Secondary | ICD-10-CM | POA: Diagnosis not present

## 2023-02-21 DIAGNOSIS — G8929 Other chronic pain: Secondary | ICD-10-CM | POA: Insufficient documentation

## 2023-02-21 MED ORDER — FENTANYL CITRATE (PF) 100 MCG/2ML IJ SOLN
25.0000 ug | INTRAMUSCULAR | Status: DC | PRN
  Administered 2023-02-21: 50 ug via INTRAVENOUS

## 2023-02-21 MED ORDER — LIDOCAINE HCL 2 % IJ SOLN
20.0000 mL | Freq: Once | INTRAMUSCULAR | Status: AC
  Administered 2023-02-21: 400 mg

## 2023-02-21 MED ORDER — LIDOCAINE HCL 2 % IJ SOLN
INTRAMUSCULAR | Status: AC
  Filled 2023-02-21: qty 20

## 2023-02-21 MED ORDER — OXYCODONE-ACETAMINOPHEN 5-325 MG PO TABS
1.0000 | ORAL_TABLET | Freq: Three times a day (TID) | ORAL | 0 refills | Status: DC | PRN
Start: 2023-02-28 — End: 2023-03-07
  Filled 2023-02-28 (×2): qty 21, 7d supply, fill #0

## 2023-02-21 MED ORDER — ROPIVACAINE HCL 2 MG/ML IJ SOLN
9.0000 mL | Freq: Once | INTRAMUSCULAR | Status: AC
  Administered 2023-02-21: 9 mL via PERINEURAL

## 2023-02-21 MED ORDER — FENTANYL CITRATE (PF) 100 MCG/2ML IJ SOLN
INTRAMUSCULAR | Status: AC
  Filled 2023-02-21: qty 2

## 2023-02-21 MED ORDER — MIDAZOLAM HCL 5 MG/5ML IJ SOLN
INTRAMUSCULAR | Status: AC
  Filled 2023-02-21: qty 5

## 2023-02-21 MED ORDER — LACTATED RINGERS IV SOLN: Freq: Once | INTRAVENOUS | Status: AC

## 2023-02-21 MED ORDER — MIDAZOLAM HCL 5 MG/5ML IJ SOLN
0.5000 mg | Freq: Once | INTRAMUSCULAR | Status: AC
  Administered 2023-02-21: 3 mg via INTRAVENOUS

## 2023-02-21 MED ORDER — PENTAFLUOROPROP-TETRAFLUOROETH EX AERO
INHALATION_SPRAY | Freq: Once | CUTANEOUS | Status: AC
  Administered 2023-02-21: 30 via TOPICAL
  Filled 2023-02-21: qty 116

## 2023-02-21 MED ORDER — TRIAMCINOLONE ACETONIDE 40 MG/ML IJ SUSP
INTRAMUSCULAR | Status: AC
  Filled 2023-02-21: qty 1

## 2023-02-21 MED ORDER — OXYCODONE-ACETAMINOPHEN 5-325 MG PO TABS
1.0000 | ORAL_TABLET | Freq: Three times a day (TID) | ORAL | 0 refills | Status: DC | PRN
Start: 2023-02-21 — End: 2023-02-28
  Filled 2023-02-21: qty 21, 7d supply, fill #0

## 2023-02-21 MED ORDER — TRIAMCINOLONE ACETONIDE 40 MG/ML IJ SUSP
40.0000 mg | Freq: Once | INTRAMUSCULAR | Status: AC
  Administered 2023-02-21: 40 mg

## 2023-02-21 MED ORDER — ROPIVACAINE HCL 2 MG/ML IJ SOLN
INTRAMUSCULAR | Status: AC
  Filled 2023-02-21: qty 20

## 2023-02-21 NOTE — Patient Instructions (Addendum)

## 2023-02-21 NOTE — Progress Notes (Signed)
PROVIDER NOTE: Interpretation of information contained herein should be left to medically-trained personnel. Specific patient instructions are provided elsewhere under "Patient Instructions" section of medical record. This document was created in part using STT-dictation technology, any transcriptional errors that may result from this process are unintentional.  Patient: Denise Burns Type: Established DOB: November 26, 1970 MRN: [id number] PCP: Carlean Jews, NP  Service: Procedure DOS: 02/21/2023 Setting: Ambulatory Location: Ambulatory outpatient facility Delivery: Face-to-face Provider: Oswaldo Done, MD Specialty: Interventional Pain Management Specialty designation: 09 Location: Outpatient facility Ref. Prov.: Delano Metz, MD       Interventional Therapy   Procedure: Lumbar Facet, Medial Branch Radiofrequency Ablation (RFA) #3  Laterality: Right (-RT)  Level: L2, L3, L4, L5, and S1 Medial Branch Level(s). These levels will denervate the L3-4, L4-5, and L5-S1 lumbar facet joints.  Imaging: Fluoroscopy-guided         Anesthesia: Local anesthesia (1-2% Lidocaine) Anxiolysis: IV Versed 3.5 mg (1.5 mg, 2 mg)  Sedation: Moderate Sedation Fentanyl 1 mL (50 mcg) DOS: 02/21/2023  Performed by: Oswaldo Done, MD  Purpose: Therapeutic/Palliative Indications: Low back pain severe enough to impact quality of life or function. Indications: 1. Chronic low back pain (1ry area of Pain) (Bilateral) (R>L) w/o sciatica   2. Lumbar facet joint pain   3. Lumbar facet syndrome (Bilateral)   4. Lumbar facet hypertrophy (Multilevel) (Bilateral)   5. Spondylosis without myelopathy or radiculopathy, lumbosacral region   6. DDD (degenerative disc disease), lumbosacral   7. L5-S1 pars defect w/ spondylolisthesis (Bilateral)   8. Chronic hip pain (Bilateral)    Ms. Marsee has been dealing with the above chronic pain for longer than three months and has either failed to respond, was  unable to tolerate, or simply did not get enough benefit from other more conservative therapies including, but not limited to: 1. Over-the-counter medications 2. Anti-inflammatory medications 3. Muscle relaxants 4. Membrane stabilizers 5. Opioids 6. Physical therapy and/or chiropractic manipulation 7. Modalities (Heat, ice, etc.) 8. Invasive techniques such as nerve blocks. Ms. Weaver has attained more than 50% relief of the pain from a series of diagnostic injections conducted in separate occasions.  Pain Score: Pre-procedure: 4 /10 Post-procedure: 4 /10    Note: On 01/25/2023 I had scheduled the patient for a bilateral lumbar facet block under fluoroscopic guidance for the purpose of doing fluoroscopic mapping for future radiofrequency ablations.  This was denied by the insurance company indicating that they would only approve for the radiofrequency and no other facet blocks.  Position / Prep / Materials:  Position: Prone  Prep solution: DuraPrep (Iodine Povacrylex [0.7% available iodine] and Isopropyl Alcohol, 74% w/w) Prep Area: Entire Lumbosacral Region (Lower back from mid-thoracic region to end of tailbone and from flank to flank.) Materials:  Tray: RFA (Radiofrequency) tray Needle(s):  Type: RFA (Teflon-coated radiofrequency ablation needles) Gauge (G): 22  Length: Regular (10cm) Qty: 4      H&P (Pre-op Assessment):  Denise Burns is a 52 y.o. (year old), female patient, seen today for interventional treatment. She  has a past surgical history that includes Cholecystectomy (2010); Polypectomy; dental work; Dilation and curettage of uterus (2010); IR NEPHROSTOMY PLACEMENT LEFT (12/14/2020); Nephrolithotomy (Left, 12/14/2020); Wisdom tooth extraction; Diagnostic laparoscopy (2004); Cystoscopy/ureteroscopy/holmium laser/stent placement (Right, 06/28/2021); Cystoscopy w/ ureteral stent removal (Right, 08/09/2021); Cystoscopy w/ retrogrades (Right, 08/09/2021);  Cystoscopy/ureteroscopy/holmium laser/stent placement (Right, 08/09/2021); and Colonoscopy with propofol (N/A, 06/08/2022). Denise Burns has a current medication list which includes the following prescription(s): azelastine hcl, cetirizine, ergocalciferol, escitalopram,  fludrocortisone, flonase sensimist, hydrocodone-acetaminophen, [START ON 02/27/2023] hydrocodone-acetaminophen, hydrocortisone, lansoprazole, meloxicam, urogesic-blue, naloxone, omega-3 acid ethyl esters, oxycodone-acetaminophen, [START ON 02/28/2023] oxycodone-acetaminophen, pseudoephedrine, hydrocodone-acetaminophen, and hydrocodone-acetaminophen, and the following Facility-Administered Medications: fentanyl, lactated ringers, lidocaine, midazolam, pentafluoroprop-tetrafluoroeth, ropivacaine (pf) 2 mg/ml (0.2%), and triamcinolone acetonide. Her primarily concern today is the Back Pain  Initial Vital Signs:  Pulse/HCG Rate: 76  Temp: (!) 97.3 F (36.3 C) Resp: 16 BP: (!) 131/92 SpO2: 96 %  BMI: Estimated body mass index is 30.73 kg/m as calculated from the following:   Height as of this encounter: 5\' 4"  (1.626 m).   Weight as of this encounter: 179 lb (81.2 kg).  Risk Assessment: Allergies: Reviewed. She is allergic to sulfacetamide sodium (acne), tramadol, lisinopril, and sulfa antibiotics.  Allergy Precautions: None required Coagulopathies: Reviewed. None identified.  Blood-thinner therapy: None at this time Active Infection(s): Reviewed. None identified. Ms. Saline is afebrile  Site Confirmation: Denise Burns was asked to confirm the procedure and laterality before marking the site Procedure checklist: Completed Consent: Before the procedure and under the influence of no sedative(s), amnesic(s), or anxiolytics, the patient was informed of the treatment options, risks and possible complications. To fulfill our ethical and legal obligations, as recommended by the American Medical Association's Code of Ethics, I have informed  the patient of my clinical impression; the nature and purpose of the treatment or procedure; the risks, benefits, and possible complications of the intervention; the alternatives, including doing nothing; the risk(s) and benefit(s) of the alternative treatment(s) or procedure(s); and the risk(s) and benefit(s) of doing nothing. The patient was provided information about the general risks and possible complications associated with the procedure. These may include, but are not limited to: failure to achieve desired goals, infection, bleeding, organ or nerve damage, allergic reactions, paralysis, and death. In addition, the patient was informed of those risks and complications associated to Spine-related procedures, such as failure to decrease pain; infection (i.e.: Meningitis, epidural or intraspinal abscess); bleeding (i.e.: epidural hematoma, subarachnoid hemorrhage, or any other type of intraspinal or peri-dural bleeding); organ or nerve damage (i.e.: Any type of peripheral nerve, nerve root, or spinal cord injury) with subsequent damage to sensory, motor, and/or autonomic systems, resulting in permanent pain, numbness, and/or weakness of one or several areas of the body; allergic reactions; (i.e.: anaphylactic reaction); and/or death. Furthermore, the patient was informed of those risks and complications associated with the medications. These include, but are not limited to: allergic reactions (i.e.: anaphylactic or anaphylactoid reaction(s)); adrenal axis suppression; blood sugar elevation that in diabetics may result in ketoacidosis or comma; water retention that in patients with history of congestive heart failure may result in shortness of breath, pulmonary edema, and decompensation with resultant heart failure; weight gain; swelling or edema; medication-induced neural toxicity; particulate matter embolism and blood vessel occlusion with resultant organ, and/or nervous system infarction; and/or aseptic  necrosis of one or more joints. Finally, the patient was informed that Medicine is not an exact science; therefore, there is also the possibility of unforeseen or unpredictable risks and/or possible complications that may result in a catastrophic outcome. The patient indicated having understood very clearly. We have given the patient no guarantees and we have made no promises. Enough time was given to the patient to ask questions, all of which were answered to the patient's satisfaction. Ms. Siegmann has indicated that she wanted to continue with the procedure. Attestation: I, the ordering provider, attest that I have discussed with the patient the benefits, risks, side-effects, alternatives, likelihood  of achieving goals, and potential problems during recovery for the procedure that I have provided informed consent. Date  Time: 02/21/2023  7:53 AM   Pre-Procedure Preparation:  Monitoring: As per clinic protocol. Respiration, ETCO2, SpO2, BP, heart rate and rhythm monitor placed and checked for adequate function Safety Precautions: Patient was assessed for positional comfort and pressure points before starting the procedure. Time-out: I initiated and conducted the "Time-out" before starting the procedure, as per protocol. The patient was asked to participate by confirming the accuracy of the "Time Out" information. Verification of the correct person, site, and procedure were performed and confirmed by me, the nursing staff, and the patient. "Time-out" conducted as per Joint Commission's Universal Protocol (UP.01.01.01). Time:   Start Time:   hrs.  Description of Procedure:          Laterality: See above. Levels:  See above. Safety Precautions: Aspiration looking for blood return was conducted prior to all injections. At no point did we inject any substances, as a needle was being advanced. Before injecting, the patient was told to immediately notify me if she was experiencing any new onset of  "ringing in the ears, or metallic taste in the mouth". No attempts were made at seeking any paresthesias. Safe injection practices and needle disposal techniques used. Medications properly checked for expiration dates. SDV (single dose vial) medications used. After the completion of the procedure, all disposable equipment used was discarded in the proper designated medical waste containers. Local Anesthesia: Protocol guidelines were followed. The patient was positioned over the fluoroscopy table. The area was prepped in the usual manner. The time-out was completed. The target area was identified using fluoroscopy. A 12-in long, straight, sterile hemostat was used with fluoroscopic guidance to locate the targets for each level blocked. Once located, the skin was marked with an approved surgical skin marker. Once all sites were marked, the skin (epidermis, dermis, and hypodermis), as well as deeper tissues (fat, connective tissue and muscle) were infiltrated with a small amount of a short-acting local anesthetic, loaded on a 10cc syringe with a 25G, 1.5-in  Needle. An appropriate amount of time was allowed for local anesthetics to take effect before proceeding to the next step. Technical description of process:  Radiofrequency Ablation (RFA) L2 Medial Branch Nerve RFA: The target area for the L2 medial branch is at the junction of the postero-lateral aspect of the superior articular process and the superior, posterior, and medial edge of the transverse process of L3. Under fluoroscopic guidance, a Radiofrequency needle was inserted until contact was made with os over the superior postero-lateral aspect of the pedicular shadow (target area). Sensory and motor testing was conducted to properly adjust the position of the needle. Once satisfactory placement of the needle was achieved, the numbing solution was slowly injected after negative aspiration for blood. 2.0 mL of the nerve block solution was injected without  difficulty or complication. After waiting for at least 3 minutes, the ablation was performed. Once completed, the needle was removed intact. L3 Medial Branch Nerve RFA: The target area for the L3 medial branch is at the junction of the postero-lateral aspect of the superior articular process and the superior, posterior, and medial edge of the transverse process of L4. Under fluoroscopic guidance, a Radiofrequency needle was inserted until contact was made with os over the superior postero-lateral aspect of the pedicular shadow (target area). Sensory and motor testing was conducted to properly adjust the position of the needle. Once satisfactory placement of the needle  was achieved, the numbing solution was slowly injected after negative aspiration for blood. 2.0 mL of the nerve block solution was injected without difficulty or complication. After waiting for at least 3 minutes, the ablation was performed. Once completed, the needle was removed intact. L4 Medial Branch Nerve RFA: The target area for the L4 medial branch is at the junction of the postero-lateral aspect of the superior articular process and the superior, posterior, and medial edge of the transverse process of L5. Under fluoroscopic guidance, a Radiofrequency needle was inserted until contact was made with os over the superior postero-lateral aspect of the pedicular shadow (target area). Sensory and motor testing was conducted to properly adjust the position of the needle. Once satisfactory placement of the needle was achieved, the numbing solution was slowly injected after negative aspiration for blood. 2.0 mL of the nerve block solution was injected without difficulty or complication. After waiting for at least 3 minutes, the ablation was performed. Once completed, the needle was removed intact. L5 Medial Branch Nerve RFA: The target area for the L5 medial branch is at the junction of the postero-lateral aspect of the superior articular process of  S1 and the superior, posterior, and medial edge of the sacral ala. Under fluoroscopic guidance, a Radiofrequency needle was inserted until contact was made with os over the superior postero-lateral aspect of the pedicular shadow (target area). Sensory and motor testing was conducted to properly adjust the position of the needle. Once satisfactory placement of the needle was achieved, the numbing solution was slowly injected after negative aspiration for blood. 2.0 mL of the nerve block solution was injected without difficulty or complication. After waiting for at least 3 minutes, the ablation was performed. Once completed, the needle was removed intact. S1 Medial Branch Nerve RFA: The target area for the S1 medial branch is located inferior to the junction of the S1 superior articular process and the L5 inferior articular process, posterior, inferior, and lateral to the 6 o'clock position of the L5-S1 facet joint, just superior to the S1 posterior foramen. Under fluoroscopic guidance, the Radiofrequency needle was advanced until contact was made with os over the Target area. Sensory and motor testing was conducted to properly adjust the position of the needle. Once satisfactory placement of the needle was achieved, the numbing solution was slowly injected after negative aspiration for blood. 2.0 mL of the nerve block solution was injected without difficulty or complication. After waiting for at least 3 minutes, the ablation was performed. Once completed, the needle was removed intact. Radiofrequency lesioning (ablation):  Radiofrequency Generator: Medtronic AccurianTM AG 1000 RF Generator Sensory Stimulation Parameters: 50 Hz was used to locate & identify the nerve, making sure that the needle was positioned such that there was no sensory stimulation below 0.3 V or above 0.7 V. Motor Stimulation Parameters: 2 Hz was used to evaluate the motor component. Care was taken not to lesion any nerves that demonstrated  motor stimulation of the lower extremities at an output of less than 2.5 times that of the sensory threshold, or a maximum of 2.0 V. Lesioning Technique Parameters: Standard Radiofrequency settings. (Not bipolar or pulsed.) Temperature Settings: 80 degrees C Lesioning time: 60 seconds Stationary intra-operative compliance: Compliant  Once the entire procedure was completed, the treated area was cleaned, making sure to leave some of the prepping solution back to take advantage of its long term bactericidal properties.    Illustration of the posterior view of the lumbar spine and the posterior neural structures.  Laminae of L2 through S1 are labeled. DPRL5, dorsal primary ramus of L5; DPRS1, dorsal primary ramus of S1; DPR3, dorsal primary ramus of L3; FJ, facet (zygapophyseal) joint L3-L4; I, inferior articular process of L4; LB1, lateral branch of dorsal primary ramus of L1; IAB, inferior articular branches from L3 medial branch (supplies L4-L5 facet joint); IBP, intermediate branch plexus; MB3, medial branch of dorsal primary ramus of L3; NR3, third lumbar nerve root; S, superior articular process of L5; SAB, superior articular branches from L4 (supplies L4-5 facet joint also); TP3, transverse process of L3.  Facet Joint Innervation (* possible contribution)  L1-2 T12, L1 (L2*)  Medial Branch  L2-3 L1, L2 (L3*)         "          "  L3-4 L2, L3 (L4*)         "          "  L4-5 L3, L4 (L5*)         "          "  L5-S1 L4, L5, S1          "          "    Vitals:   02/21/23 0752  BP: (!) 131/92  Pulse: 76  Resp: 16  Temp: (!) 97.3 F (36.3 C)  SpO2: 96%  Weight: 179 lb (81.2 kg)  Height: 5\' 4"  (1.626 m)    Start Time:   hrs. End Time:   hrs.  Imaging Guidance (Spinal):          Type of Imaging Technique: Fluoroscopy Guidance (Spinal) Indication(s): Assistance in needle guidance and placement for procedures requiring needle placement in or near specific anatomical locations not easily  accessible without such assistance. Exposure Time: Please see nurses notes. Contrast: None used. Fluoroscopic Guidance: I was personally present during the use of fluoroscopy. "Tunnel Vision Technique" used to obtain the best possible view of the target area. Parallax error corrected before commencing the procedure. "Direction-depth-direction" technique used to introduce the needle under continuous pulsed fluoroscopy. Once target was reached, antero-posterior, oblique, and lateral fluoroscopic projection used confirm needle placement in all planes. Images permanently stored in EMR. Interpretation: No contrast injected. I personally interpreted the imaging intraoperatively. Adequate needle placement confirmed in multiple planes. Permanent images saved into the patient's record.  Antibiotic Prophylaxis:   Anti-infectives (From admission, onward)    None      Indication(s): None identified  Post-operative Assessment:  Post-procedure Vital Signs:  Pulse/HCG Rate: 76  Temp: (!) 97.3 F (36.3 C) Resp: 16 BP: (!) 131/92 SpO2: 96 %  EBL: None  Complications: No immediate post-treatment complications observed by team, or reported by patient.  Note: The patient tolerated the entire procedure well. A repeat set of vitals were taken after the procedure and the patient was kept under observation following institutional policy, for this type of procedure. Post-procedural neurological assessment was performed, showing return to baseline, prior to discharge. The patient was provided with post-procedure discharge instructions, including a section on how to identify potential problems. Should any problems arise concerning this procedure, the patient was given instructions to immediately contact us, at any time, without hesitation. In any case, we plan to contact the patient by telephone for a follow-up status report regarding this interventional procedure.  Comments:  No additional relevant  information.  Plan of Care (POC)  Orders:  Orders Placed This Encounter  Procedures   Radiofrequency,Lumbar  Scheduling Instructions:     Side(s): Right-sided     Level: L3-4, L4-5, and L5-S1 Facets (L2, L3, L4, L5, and S1 Medial Branch)     Sedation: With Sedation.     Timeframe: Today    Order Specific Question:   Where will this procedure be performed?    Answer:   [facility]   Radiofrequency,Lumbar    Standing Status:   Future    Standing Expiration Date:   05/24/2023    Scheduling Instructions:     Side(s): Left-sided     Level: L3-4, L4-5, and L5-S1 Facets (L2, L3, L4, L5, and S1 Medial Branch)     Sedation: With Sedation.     Scheduling Timeframe: 2 weeks from now    Order Specific Question:   Where will this procedure be performed?    Answer:   [facility]   DG PAIN CLINIC C-ARM 1-60 MIN NO REPORT    Intraoperative interpretation by procedural physician at [facility].    Standing Status:   Standing    Number of Occurrences:   1    Order Specific Question:   Reason for exam:    Answer:   Assistance in needle guidance and placement for procedures requiring needle placement in or near specific anatomical locations not easily accessible without such assistance.   Informed Consent Details: Physician/Practitioner Attestation; Transcribe to consent form and obtain patient signature    Nursing Order: Transcribe to consent form and obtain patient signature. Note: Always confirm laterality of pain with Ms. Lula Olszewski, before procedure.    Order Specific Question:   Physician/Practitioner attestation of informed consent for procedure/surgical case    Answer:   I, the physician/practitioner, attest that I have discussed with the patient the benefits, risks, side effects, alternatives, likelihood of achieving goals and potential problems during recovery for the procedure that I have provided informed consent.    Order Specific Question:   Procedure     Answer:   Lumbar Facet Radiofrequency Ablation    Order Specific Question:   Physician/Practitioner performing the procedure    Answer:   Adryen Cookson A. Laban Emperor, MD    Order Specific Question:   Indication/Reason    Answer:   Low Back Pain, with our without leg pain, due to Facet Joint Arthralgia (Joint Pain) known as Lumbar Facet Syndrome, secondary to Lumbar, and/or Lumbosacral Spondylosis (Arthritis of the Spine), without myelopathy or radiculopathy (Nerve Damage).   Provide equipment / supplies at bedside    Procedure tray: "Radiofrequency Tray" Additional material: Large hemostat (x1); Small hemostat (x1); Towels (x8); 4x4 sterile sponge pack (x1) Needle type: Teflon-coated Radiofrequency Needle (Disposable  single use) Size: Regular Quantity: 4    Standing Status:   Standing    Number of Occurrences:   1    Order Specific Question:   Specify    Answer:   Radiofrequency Tray   Chronic Opioid Analgesic:  Hydrocodone/APAP 7.5/325 1 tablet p.o. 3 times daily (22.5 mg/day of hydrocodone)  MME/day: 22.5 mg/day.   Medications ordered for procedure: Meds ordered this encounter  Medications   oxyCODONE-acetaminophen (PERCOCET) 5-325 MG tablet    Sig: Take 1 tablet by mouth every 8 (eight) hours as needed for up to 7 days for severe pain. Must last 7 days.    Dispense:  21 tablet    Refill:  0    For acute post-operative pain. Not to be refilled.  Must last 7 days.   oxyCODONE-acetaminophen (PERCOCET) 5-325 MG tablet  Sig: Take 1 tablet by mouth every 8 (eight) hours as needed for up to 7 days for severe pain. Must last 7 days.    Dispense:  21 tablet    Refill:  0    For acute post-operative pain. Not to be refilled.  Must last 7 days.   lidocaine (XYLOCAINE) 2 % (with pres) injection 400 mg   pentafluoroprop-tetrafluoroeth (GEBAUERS) aerosol   lactated ringers infusion   midazolam (VERSED) 5 MG/5ML injection 0.5-2 mg    Make sure Flumazenil is available in the pyxis when using this  medication. If oversedation occurs, administer 0.2 mg IV over 15 sec. If after 45 sec no response, administer 0.2 mg again over 1 min; may repeat at 1 min intervals; not to exceed 4 doses (1 mg)   fentaNYL (SUBLIMAZE) injection 25-50 mcg    Make sure Narcan is available in the pyxis when using this medication. In the event of respiratory depression (RR< 8/min): Titrate NARCAN (naloxone) in increments of 0.1 to 0.2 mg IV at 2-3 minute intervals, until desired degree of reversal.   ropivacaine (PF) 2 mg/mL (0.2%) (NAROPIN) injection 9 mL   triamcinolone acetonide (KENALOG-40) injection 40 mg   Medications administered: Rosezella [place]. Haughton had no medications administered during this visit.  See the medical record for exact dosing, route, and time of administration.  Follow-up plan:   Return in about 2 weeks (around 03/07/2023) for (ECT): (L) L-FCT RFA #3.       Interventional Therapies  Risk  Complexity Considerations:   WNL   Planned  Pending:   Diagnostic/therapeutic bilateral lumbar facet MBB #4 + bilateral trochanteric bursa inj. #4    Under consideration:   Therapeutic bilateral lumbar facet medial branch RFA #3    Completed:   Therapeutic bilateral IA hip injection (posterolateral approach) R3L2 (09/06/2022) (100/100/90/90)  Therapeutic bilateral lumbar facet MBB x3 (09/08/2020) (4-0) (100/100/60/60)  Therapeutic right lumbar facet RFA x2 (09/09/2021) (4-2) (100/100/90/>75)  Therapeutic left lumbar facet RFA x2 (10/07/2021) (4-0) (100/100/90/>75)  Therapeutic right IA hip inj. x2 (08/20/2020) (5-1) (100/100/80/80)  Therapeutic left IA hip inj. x1 (08/20/2020) (5-1) (100/100/80/80)  Therapeutic bilateral trochanteric bursa inj. x3 (04/07/2022)  (100/100/90/90)    Therapeutic  Palliative (PRN) options:   Palliative lumbar facet MBB   Palliative lumbar facet RFA  Palliative trochanteric bursa inj.    Pharmacotherapy  Nonopioids transferred 05/11/2020: Baclofen and Mobic        Recent Visits Date Type Provider Dept  01/25/23 Office Visit Delano Metz, MD [facility]  12/05/22 Office Visit Delano Metz, MD [facility]  Showing recent visits within past 90 days and meeting all other requirements Today's Visits Date Type Provider Dept  02/21/23 Procedure visit Delano Metz, MD [facility]  Showing today's visits and meeting all other requirements Future Appointments Date Type Provider Dept  03/22/23 Appointment Delano Metz, MD [facility]  Showing future appointments within next 90 days and meeting all other requirements  Disposition: Discharge home  Discharge (Date  Time): 02/21/2023;   hrs.   Primary Care Physician: Carlean Jews, NP Location: [facility] Note by: Oswaldo Done, MD (TTS technology used. I apologize for any typographical errors that were not detected and corrected.) Date: 02/21/2023; Time: 8:21 AM  Disclaimer:  Medicine is not an Visual merchandiser. The only guarantee in medicine is that nothing is guaranteed. It is important to note that the decision to proceed with this intervention was based on the  information collected from the patient. The Data and conclusions were drawn from the patient's questionnaire, the interview, and the physical examination. Because the information was provided in large part by the patient, it cannot be guaranteed that it has not been purposely or unconsciously manipulated. Every effort has been made to obtain as much relevant data as possible for this evaluation. It is important to note that the conclusions that lead to this procedure are derived in large part from the available data. Always take into account that the treatment will also be dependent on availability of resources and existing treatment guidelines, considered by other Pain Management Practitioners as being common knowledge and practice, at the time of the  intervention. For Medico-Legal purposes, it is also important to point out that variation in procedural techniques and pharmacological choices are the acceptable norm. The indications, contraindications, technique, and results of the above procedure should only be interpreted and judged by a Board-Certified Interventional Pain Specialist with extensive familiarity and expertise in the same exact procedure and technique.

## 2023-02-21 NOTE — Progress Notes (Signed)
Safety precautions to be maintained throughout the outpatient stay will include: orient to surroundings, keep bed in low position, maintain call bell within reach at all times, provide assistance with transfer out of bed and ambulation.  

## 2023-02-22 ENCOUNTER — Telehealth: Payer: Self-pay | Admitting: *Deleted

## 2023-02-22 NOTE — Telephone Encounter (Signed)
Attempted to call for post procedure follow-up. Message left. 

## 2023-02-27 ENCOUNTER — Other Ambulatory Visit: Payer: Self-pay | Admitting: Family Medicine

## 2023-02-27 DIAGNOSIS — Z1231 Encounter for screening mammogram for malignant neoplasm of breast: Secondary | ICD-10-CM

## 2023-02-28 ENCOUNTER — Other Ambulatory Visit: Payer: Self-pay

## 2023-02-28 ENCOUNTER — Other Ambulatory Visit (HOSPITAL_COMMUNITY): Payer: Self-pay

## 2023-03-08 MED FILL — Azelastine HCl Nasal Spray 0.1% (137 MCG/SPRAY): NASAL | 30 days supply | Qty: 30 | Fill #1 | Status: AC

## 2023-03-08 NOTE — Progress Notes (Unsigned)
PROVIDER NOTE: Interpretation of information contained herein should be left to medically-trained personnel. Specific patient instructions are provided elsewhere under "Patient Instructions" section of medical record. This document was created in part using STT-dictation technology, any transcriptional errors that may result from this process are unintentional.  Patient: Denise Burns Type: Established DOB: Aug 20, 1970 MRN: [id number] PCP: Carlean Jews, NP  Service: Procedure DOS: 03/09/2023 Setting: Ambulatory Location: Ambulatory outpatient facility Delivery: Face-to-face Provider: Oswaldo Done, MD Specialty: Interventional Pain Management Specialty designation: 09 Location: Outpatient facility Ref. Prov.: Delano Metz, MD       Interventional Therapy   Procedure: Lumbar Facet, Medial Branch Radiofrequency Ablation (RFA) #3  Laterality: Left (-LT)  Level: L2, L3, L4, L5, and S1 Medial Branch Level(s). These levels will denervate the L3-4, L4-5, and L5-S1 lumbar facet joints.  Imaging: Fluoroscopy-guided         Anesthesia: Local anesthesia (1-2% Lidocaine) Anxiolysis: IV Versed 3.5 mg Sedation: Moderate Sedation Fentanyl 1 mL (50 mcg) DOS: 03/09/2023  Performed by: Oswaldo Done, MD  Purpose: Therapeutic/Palliative Indications: Low back pain severe enough to impact quality of life or function. Indications: 1. Chronic low back pain (1ry area of Pain) (Bilateral) (R>L) w/o sciatica   2. DDD (degenerative disc disease), lumbosacral   3. Lumbar facet joint pain   4. Lumbar facet syndrome (Bilateral)   5. Lumbar Grade 1 Retrolisthesis (2 mm) of L2/L3   6. Spondylosis without myelopathy or radiculopathy, lumbosacral region    Ms. Fedie has been dealing with the above chronic pain for longer than three months and has either failed to respond, was unable to tolerate, or simply did not get enough benefit from other more conservative therapies including, but not  limited to: 1. Over-the-counter medications 2. Anti-inflammatory medications 3. Muscle relaxants 4. Membrane stabilizers 5. Opioids 6. Physical therapy and/or chiropractic manipulation 7. Modalities (Heat, ice, etc.) 8. Invasive techniques such as nerve blocks. Ms. Burst has attained more than 50% relief of the pain from a series of diagnostic injections conducted in separate occasions.  Pain Score: Pre-procedure: 3 /10 Post-procedure: 0-No pain/10     Position / Prep / Materials:  Position: Prone  Prep solution: DuraPrep (Iodine Povacrylex [0.7% available iodine] and Isopropyl Alcohol, 74% w/w) Prep Area: Entire Lumbosacral Region (Lower back from mid-thoracic region to end of tailbone and from flank to flank.) Materials:  Tray: RFA (Radiofrequency) tray Needle(s):  Type: RFA (Teflon-coated radiofrequency ablation needles) Gauge (G): 22  Length: Regular (10cm) Qty: 4      Post-procedure evaluation   Procedure: Lumbar Facet, Medial Branch Radiofrequency Ablation (RFA) #3  Laterality: Right (-RT)  Level: L2, L3, L4, L5, and S1 Medial Branch Level(s). These levels will denervate the L3-4, L4-5, and L5-S1 lumbar facet joints.  Imaging: Fluoroscopy-guided         Anesthesia: Local anesthesia (1-2% Lidocaine) Anxiolysis: IV Versed 3.5 mg (1.5 mg, 2 mg)  Sedation: Moderate Sedation Fentanyl 1 mL (50 mcg) DOS: 02/21/2023  Performed by: Oswaldo Done, MD Purpose: Therapeutic/Palliative Indications: Low back pain severe enough to impact quality of life or function.  Pain Score: Pre-procedure: 4 /10 Post-procedure: 4 /10     Effectiveness:  Initial hour after procedure: 100 %. Subsequent 4-6 hours post-procedure: 100 %. Analgesia past initial 6 hours: 80 %. Ongoing improvement:  Analgesic:  Ongoing 80% improvement. Function: Ms. Ha reports improvement in function ROM: Ms. Creath reports improvement in ROM  H&P (Pre-op Assessment):  Ms. Schranz is a 52  y.o. (year  old), female patient, seen today for interventional treatment. She  has a past surgical history that includes Cholecystectomy (2010); Polypectomy; dental work; Dilation and curettage of uterus (2010); IR NEPHROSTOMY PLACEMENT LEFT (12/14/2020); Nephrolithotomy (Left, 12/14/2020); Wisdom tooth extraction; Diagnostic laparoscopy (2004); Cystoscopy/ureteroscopy/holmium laser/stent placement (Right, 06/28/2021); Cystoscopy w/ ureteral stent removal (Right, 08/09/2021); Cystoscopy w/ retrogrades (Right, 08/09/2021); Cystoscopy/ureteroscopy/holmium laser/stent placement (Right, 08/09/2021); and Colonoscopy with propofol (N/A, 06/08/2022). Ms. Mizera has a current medication list which includes the following prescription(s): azelastine hcl, cetirizine, ergocalciferol, escitalopram, fludrocortisone, flonase sensimist, hydrocodone-acetaminophen, hydrocodone-acetaminophen, hydrocodone-acetaminophen, hydrocodone-acetaminophen, hydrocortisone, lansoprazole, meloxicam, urogesic-blue, naloxone, omega-3 acid ethyl esters, oxycodone-acetaminophen, [START ON 03/16/2023] oxycodone-acetaminophen, and pseudoephedrine, and the following Facility-Administered Medications: fentanyl. Her primarily concern today is the Back Pain (Lower left)  Initial Vital Signs:  Pulse/HCG Rate: 80ECG Heart Rate: 71 Temp: (!) 97.4 F (36.3 C) Resp: 16 BP: (!) 136/95 SpO2: 99 %  BMI: Estimated body mass index is 30.38 kg/m as calculated from the following:   Height as of this encounter: 5\' 4"  (1.626 m).   Weight as of this encounter: 177 lb (80.3 kg).  Risk Assessment: Allergies: Reviewed. She is allergic to sulfacetamide sodium (acne), tramadol, lisinopril, and sulfa antibiotics.  Allergy Precautions: None required Coagulopathies: Reviewed. None identified.  Blood-thinner therapy: None at this time Active Infection(s): Reviewed. None identified. Ms. Durrence is afebrile  Site Confirmation: Ms. Contessa was asked to confirm the  procedure and laterality before marking the site Procedure checklist: Completed Consent: Before the procedure and under the influence of no sedative(s), amnesic(s), or anxiolytics, the patient was informed of the treatment options, risks and possible complications. To fulfill our ethical and legal obligations, as recommended by the American Medical Association's Code of Ethics, I have informed the patient of my clinical impression; the nature and purpose of the treatment or procedure; the risks, benefits, and possible complications of the intervention; the alternatives, including doing nothing; the risk(s) and benefit(s) of the alternative treatment(s) or procedure(s); and the risk(s) and benefit(s) of doing nothing. The patient was provided information about the general risks and possible complications associated with the procedure. These may include, but are not limited to: failure to achieve desired goals, infection, bleeding, organ or nerve damage, allergic reactions, paralysis, and death. In addition, the patient was informed of those risks and complications associated to Spine-related procedures, such as failure to decrease pain; infection (i.e.: Meningitis, epidural or intraspinal abscess); bleeding (i.e.: epidural hematoma, subarachnoid hemorrhage, or any other type of intraspinal or peri-dural bleeding); organ or nerve damage (i.e.: Any type of peripheral nerve, nerve root, or spinal cord injury) with subsequent damage to sensory, motor, and/or autonomic systems, resulting in permanent pain, numbness, and/or weakness of one or several areas of the body; allergic reactions; (i.e.: anaphylactic reaction); and/or death. Furthermore, the patient was informed of those risks and complications associated with the medications. These include, but are not limited to: allergic reactions (i.e.: anaphylactic or anaphylactoid reaction(s)); adrenal axis suppression; blood sugar elevation that in diabetics may result  in ketoacidosis or comma; water retention that in patients with history of congestive heart failure may result in shortness of breath, pulmonary edema, and decompensation with resultant heart failure; weight gain; swelling or edema; medication-induced neural toxicity; particulate matter embolism and blood vessel occlusion with resultant organ, and/or nervous system infarction; and/or aseptic necrosis of one or more joints. Finally, the patient was informed that Medicine is not an exact science; therefore, there is also the possibility of unforeseen or unpredictable risks and/or possible complications that may  result in a catastrophic outcome. The patient indicated having understood very clearly. We have given the patient no guarantees and we have made no promises. Enough time was given to the patient to ask questions, all of which were answered to the patient's satisfaction. Ms. Martone has indicated that she wanted to continue with the procedure. Attestation: I, the ordering provider, attest that I have discussed with the patient the benefits, risks, side-effects, alternatives, likelihood of achieving goals, and potential problems during recovery for the procedure that I have provided informed consent. Date  Time: 03/09/2023  8:51 AM   Pre-Procedure Preparation:  Monitoring: As per clinic protocol. Respiration, ETCO2, SpO2, BP, heart rate and rhythm monitor placed and checked for adequate function Safety Precautions: Patient was assessed for positional comfort and pressure points before starting the procedure. Time-out: I initiated and conducted the "Time-out" before starting the procedure, as per protocol. The patient was asked to participate by confirming the accuracy of the "Time Out" information. Verification of the correct person, site, and procedure were performed and confirmed by me, the nursing staff, and the patient. "Time-out" conducted as per Joint Commission's Universal Protocol  (UP.01.01.01). Time: 0938 Start Time: 0938 hrs.  Description of Procedure:          Laterality: See above. Levels:  See above. Safety Precautions: Aspiration looking for blood return was conducted prior to all injections. At no point did we inject any substances, as a needle was being advanced. Before injecting, the patient was told to immediately notify me if she was experiencing any new onset of "ringing in the ears, or metallic taste in the mouth". No attempts were made at seeking any paresthesias. Safe injection practices and needle disposal techniques used. Medications properly checked for expiration dates. SDV (single dose vial) medications used. After the completion of the procedure, all disposable equipment used was discarded in the proper designated medical waste containers. Local Anesthesia: Protocol guidelines were followed. The patient was positioned over the fluoroscopy table. The area was prepped in the usual manner. The time-out was completed. The target area was identified using fluoroscopy. A 12-in long, straight, sterile hemostat was used with fluoroscopic guidance to locate the targets for each level blocked. Once located, the skin was marked with an approved surgical skin marker. Once all sites were marked, the skin (epidermis, dermis, and hypodermis), as well as deeper tissues (fat, connective tissue and muscle) were infiltrated with a small amount of a short-acting local anesthetic, loaded on a 10cc syringe with a 25G, 1.5-in  Needle. An appropriate amount of time was allowed for local anesthetics to take effect before proceeding to the next step. Technical description of process:  Radiofrequency Ablation (RFA) L2 Medial Branch Nerve RFA: The target area for the L2 medial branch is at the junction of the postero-lateral aspect of the superior articular process and the superior, posterior, and medial edge of the transverse process of L3. Under fluoroscopic guidance, a Radiofrequency  needle was inserted until contact was made with os over the superior postero-lateral aspect of the pedicular shadow (target area). Sensory and motor testing was conducted to properly adjust the position of the needle. Once satisfactory placement of the needle was achieved, the numbing solution was slowly injected after negative aspiration for blood. 2.0 mL of the nerve block solution was injected without difficulty or complication. After waiting for at least 3 minutes, the ablation was performed. Once completed, the needle was removed intact. L3 Medial Branch Nerve RFA: The target area for the L3  medial branch is at the junction of the postero-lateral aspect of the superior articular process and the superior, posterior, and medial edge of the transverse process of L4. Under fluoroscopic guidance, a Radiofrequency needle was inserted until contact was made with os over the superior postero-lateral aspect of the pedicular shadow (target area). Sensory and motor testing was conducted to properly adjust the position of the needle. Once satisfactory placement of the needle was achieved, the numbing solution was slowly injected after negative aspiration for blood. 2.0 mL of the nerve block solution was injected without difficulty or complication. After waiting for at least 3 minutes, the ablation was performed. Once completed, the needle was removed intact. L4 Medial Branch Nerve RFA: The target area for the L4 medial branch is at the junction of the postero-lateral aspect of the superior articular process and the superior, posterior, and medial edge of the transverse process of L5. Under fluoroscopic guidance, a Radiofrequency needle was inserted until contact was made with os over the superior postero-lateral aspect of the pedicular shadow (target area). Sensory and motor testing was conducted to properly adjust the position of the needle. Once satisfactory placement of the needle was achieved, the numbing solution  was slowly injected after negative aspiration for blood. 2.0 mL of the nerve block solution was injected without difficulty or complication. After waiting for at least 3 minutes, the ablation was performed. Once completed, the needle was removed intact. L5 Medial Branch Nerve RFA: The target area for the L5 medial branch is at the junction of the postero-lateral aspect of the superior articular process of S1 and the superior, posterior, and medial edge of the sacral ala. Under fluoroscopic guidance, a Radiofrequency needle was inserted until contact was made with os over the superior postero-lateral aspect of the pedicular shadow (target area). Sensory and motor testing was conducted to properly adjust the position of the needle. Once satisfactory placement of the needle was achieved, the numbing solution was slowly injected after negative aspiration for blood. 2.0 mL of the nerve block solution was injected without difficulty or complication. After waiting for at least 3 minutes, the ablation was performed. Once completed, the needle was removed intact. S1 Medial Branch Nerve RFA: The target area for the S1 medial branch is located inferior to the junction of the S1 superior articular process and the L5 inferior articular process, posterior, inferior, and lateral to the 6 o'clock position of the L5-S1 facet joint, just superior to the S1 posterior foramen. Under fluoroscopic guidance, the Radiofrequency needle was advanced until contact was made with os over the Target area. Sensory and motor testing was conducted to properly adjust the position of the needle. Once satisfactory placement of the needle was achieved, the numbing solution was slowly injected after negative aspiration for blood. 2.0 mL of the nerve block solution was injected without difficulty or complication. After waiting for at least 3 minutes, the ablation was performed. Once completed, the needle was removed intact. Radiofrequency lesioning  (ablation):  Radiofrequency Generator: Medtronic AccurianTM AG 1000 RF Generator Sensory Stimulation Parameters: 50 Hz was used to locate & identify the nerve, making sure that the needle was positioned such that there was no sensory stimulation below 0.3 V or above 0.7 V. Motor Stimulation Parameters: 2 Hz was used to evaluate the motor component. Care was taken not to lesion any nerves that demonstrated motor stimulation of the lower extremities at an output of less than 2.5 times that of the sensory threshold, or a maximum of  2.0 V. Lesioning Technique Parameters: Standard Radiofrequency settings. (Not bipolar or pulsed.) Temperature Settings: 80 degrees C Lesioning time: 60 seconds Stationary intra-operative compliance: Compliant  Once the entire procedure was completed, the treated area was cleaned, making sure to leave some of the prepping solution back to take advantage of its long term bactericidal properties.    Illustration of the posterior view of the lumbar spine and the posterior neural structures. Laminae of L2 through S1 are labeled. DPRL5, dorsal primary ramus of L5; DPRS1, dorsal primary ramus of S1; DPR3, dorsal primary ramus of L3; FJ, facet (zygapophyseal) joint L3-L4; I, inferior articular process of L4; LB1, lateral branch of dorsal primary ramus of L1; IAB, inferior articular branches from L3 medial branch (supplies L4-L5 facet joint); IBP, intermediate branch plexus; MB3, medial branch of dorsal primary ramus of L3; NR3, third lumbar nerve root; S, superior articular process of L5; SAB, superior articular branches from L4 (supplies L4-5 facet joint also); TP3, transverse process of L3.  Facet Joint Innervation (* possible contribution)  L1-2 T12, L1 (L2*)  Medial Branch  L2-3 L1, L2 (L3*)         "          "  L3-4 L2, L3 (L4*)         "          "  L4-5 L3, L4 (L5*)         "          "  L5-S1 L4, L5, S1          "          "    Vitals:   03/09/23 1005 03/09/23 1010  03/09/23 1020 03/09/23 1030  BP: (!) 119/90 130/88 117/76 129/74  Pulse: 73     Resp: 16 15 19 17   Temp:  (!) 97.4 F (36.3 C)  (!) 97.2 F (36.2 C)  TempSrc:      SpO2: 98% 95% 95% 96%  Weight:      Height:        Start Time: 0938 hrs. End Time: 1005 hrs.  Imaging Guidance (Spinal):          Type of Imaging Technique: Fluoroscopy Guidance (Spinal) Indication(s): Assistance in needle guidance and placement for procedures requiring needle placement in or near specific anatomical locations not easily accessible without such assistance. Exposure Time: Please see nurses notes. Contrast: None used. Fluoroscopic Guidance: I was personally present during the use of fluoroscopy. "Tunnel Vision Technique" used to obtain the best possible view of the target area. Parallax error corrected before commencing the procedure. "Direction-depth-direction" technique used to introduce the needle under continuous pulsed fluoroscopy. Once target was reached, antero-posterior, oblique, and lateral fluoroscopic projection used confirm needle placement in all planes. Images permanently stored in EMR. Interpretation: No contrast injected. I personally interpreted the imaging intraoperatively. Adequate needle placement confirmed in multiple planes. Permanent images saved into the patient's record.  Antibiotic Prophylaxis:   Anti-infectives (From admission, onward)    None      Indication(s): None identified  Post-operative Assessment:  Post-procedure Vital Signs:  Pulse/HCG Rate: 7380 Temp: (!) 97.2 F (36.2 C) Resp: 17 BP: 129/74 SpO2: 96 %  EBL: None  Complications: No immediate post-treatment complications observed by team, or reported by patient.  Note: The patient tolerated the entire procedure well. A repeat set of vitals were taken after the procedure and the patient was kept under observation following institutional policy, for this type  of procedure. Post-procedural neurological  assessment was performed, showing return to baseline, prior to discharge. The patient was provided with post-procedure discharge instructions, including a section on how to identify potential problems. Should any problems arise concerning this procedure, the patient was given instructions to immediately contact us, at any time, without hesitation. In any case, we plan to contact the patient by telephone for a follow-up status report regarding this interventional procedure.  Comments:  No additional relevant information.  Plan of Care (POC)  Orders:  Orders Placed This Encounter  Procedures   Radiofrequency,Lumbar    Scheduling Instructions:     Side(s): Left-sided     Level: L3-4, L4-5, and L5-S1 Facets (L2, L3, L4, L5, and S1 Medial Branch)     Sedation: With Sedation.     Timeframe: Today    Order Specific Question:   Where will this procedure be performed?    Answer:   [facility]   DG [facility] C-ARM 1-60 MIN NO REPORT    Intraoperative interpretation by procedural physician at [facility].    Standing Status:   Standing    Number of Occurrences:   1    Order Specific Question:   Reason for exam:    Answer:   Assistance in needle guidance and placement for procedures requiring needle placement in or near specific anatomical locations not easily accessible without such assistance.   Nursing Instructions:    Please complete this patient's postprocedure evaluation.    Scheduling Instructions:     Please complete this patient's postprocedure evaluation.   Informed Consent Details: Physician/Practitioner Attestation; Transcribe to consent form and obtain patient signature    Nursing Order: Transcribe to consent form and obtain patient signature. Note: Always confirm laterality of pain with Ms. Lula Olszewski, before procedure.    Order Specific Question:   Physician/Practitioner attestation of informed consent for procedure/surgical case    Answer:   I, the  physician/practitioner, attest that I have discussed with the patient the benefits, risks, side effects, alternatives, likelihood of achieving goals and potential problems during recovery for the procedure that I have provided informed consent.    Order Specific Question:   Procedure    Answer:   Lumbar Facet Radiofrequency Ablation    Order Specific Question:   Physician/Practitioner performing the procedure    Answer:    A. Laban Emperor, MD    Order Specific Question:   Indication/Reason    Answer:   Low Back Pain, with our without leg pain, due to Facet Joint Arthralgia (Joint Pain) known as Lumbar Facet Syndrome, secondary to Lumbar, and/or Lumbosacral Spondylosis (Arthritis of the Spine), without myelopathy or radiculopathy (Nerve Damage).   Provide equipment / supplies at bedside    Procedure tray: "Radiofrequency Tray" Additional material: Large hemostat (x1); Small hemostat (x1); Towels (x8); 4x4 sterile sponge pack (x1) Needle type: Teflon-coated Radiofrequency Needle (Disposable  single use) Size: Regular Quantity: 4    Standing Status:   Standing    Number of Occurrences:   1    Order Specific Question:   Specify    Answer:   Radiofrequency Tray   Chronic Opioid Analgesic:  Hydrocodone/APAP 7.5/325 1 tablet p.o. 3 times daily (22.5 mg/day of hydrocodone)  MME/day: 22.5 mg/day.   Medications ordered for procedure: Meds ordered this encounter  Medications   oxyCODONE-acetaminophen (PERCOCET) 5-325 MG tablet    Sig: Take 1 tablet by mouth every 8 (eight) hours as needed for up to 7 days for severe pain. Must  last 7 days.    Dispense:  21 tablet    Refill:  0    For acute post-operative pain. Not to be refilled.  Must last 7 days.   oxyCODONE-acetaminophen (PERCOCET) 5-325 MG tablet    Sig: Take 1 tablet by mouth every 8 (eight) hours as needed for up to 7 days for severe pain. Must last 7 days.    Dispense:  21 tablet    Refill:  0    For acute post-operative pain. Not  to be refilled.  Must last 7 days.   lidocaine (XYLOCAINE) 2 % (with pres) injection 400 mg   pentafluoroprop-tetrafluoroeth (GEBAUERS) aerosol   lactated ringers infusion   midazolam (VERSED) 5 MG/5ML injection 0.5-2 mg    Make sure Flumazenil is available in the pyxis when using this medication. If oversedation occurs, administer 0.2 mg IV over 15 sec. If after 45 sec no response, administer 0.2 mg again over 1 min; may repeat at 1 min intervals; not to exceed 4 doses (1 mg)   fentaNYL (SUBLIMAZE) injection 25-50 mcg    Make sure Narcan is available in the pyxis when using this medication. In the event of respiratory depression (RR< 8/min): Titrate NARCAN (naloxone) in increments of 0.1 to 0.2 mg IV at 2-3 minute intervals, until desired degree of reversal.   ropivacaine (PF) 2 mg/mL (0.2%) (NAROPIN) injection 9 mL   triamcinolone acetonide (KENALOG-40) injection 40 mg   Medications administered: We administered lidocaine, pentafluoroprop-tetrafluoroeth, lactated ringers, midazolam, fentaNYL, ropivacaine (PF) 2 mg/mL (0.2%), and triamcinolone acetonide.  See the medical record for exact dosing, route, and time of administration.  Follow-up plan:   Return in about 6 weeks (around 04/20/2023) for (Face2F), (PPE).       Interventional Therapies  Risk  Complexity Considerations:   WNL   Planned  Pending:   Diagnostic/therapeutic bilateral lumbar facet MBB #4 + bilateral trochanteric bursa inj. #4    Under consideration:   Therapeutic bilateral lumbar facet medial branch RFA #3    Completed:   Therapeutic bilateral IA hip injection (posterolateral approach) R3L2 (09/06/2022) (100/100/90/90)  Therapeutic bilateral lumbar facet MBB x3 (09/08/2020) (4-0) (100/100/60/60)  Therapeutic right lumbar facet RFA x2 (09/09/2021) (4-2) (100/100/90/>75)  Therapeutic left lumbar facet RFA x2 (10/07/2021) (4-0) (100/100/90/>75)  Therapeutic right IA hip inj. x2 (08/20/2020) (5-1) (100/100/80/80)   Therapeutic left IA hip inj. x1 (08/20/2020) (5-1) (100/100/80/80)  Therapeutic bilateral trochanteric bursa inj. x3 (04/07/2022)  (100/100/90/90)    Therapeutic  Palliative (PRN) options:   Palliative lumbar facet MBB   Palliative lumbar facet RFA  Palliative trochanteric bursa inj.    Pharmacotherapy  Nonopioids transferred 05/11/2020: Baclofen and Mobic        Recent Visits Date Type Provider Dept  02/21/23 Procedure visit Delano Metz, MD [facility]  01/25/23 Office Visit Delano Metz, MD [facility]  Showing recent visits within past 90 days and meeting all other requirements Today's Visits Date Type Provider Dept  03/09/23 Procedure visit Delano Metz, MD [facility]  Showing today's visits and meeting all other requirements Future Appointments Date Type Provider Dept  03/22/23 Appointment Delano Metz, MD [facility]  04/20/23 Appointment Delano Metz, MD [facility]  Showing future appointments within next 90 days and meeting all other requirements  Disposition: Discharge home  Discharge (Date  Time): 03/09/2023; 1036 hrs.   Primary Care Physician: Carlean Jews, NP Location: [facility] Note by: Oswaldo Done, MD (TTS technology  used. I apologize for any typographical errors that were not detected and corrected.) Date: 03/09/2023; Time: 11:06 AM  Disclaimer:  Medicine is not an Visual merchandiser. The only guarantee in medicine is that nothing is guaranteed. It is important to note that the decision to proceed with this intervention was based on the information collected from the patient. The Data and conclusions were drawn from the patient's questionnaire, the interview, and the physical examination. Because the information was provided in large part by the patient, it cannot be guaranteed that it has not been purposely or unconsciously manipulated. Every  effort has been made to obtain as much relevant data as possible for this evaluation. It is important to note that the conclusions that lead to this procedure are derived in large part from the available data. Always take into account that the treatment will also be dependent on availability of resources and existing treatment guidelines, considered by other Pain Management Practitioners as being common knowledge and practice, at the time of the intervention. For Medico-Legal purposes, it is also important to point out that variation in procedural techniques and pharmacological choices are the acceptable norm. The indications, contraindications, technique, and results of the above procedure should only be interpreted and judged by a Board-Certified Interventional Pain Specialist with extensive familiarity and expertise in the same exact procedure and technique.

## 2023-03-09 ENCOUNTER — Other Ambulatory Visit (HOSPITAL_BASED_OUTPATIENT_CLINIC_OR_DEPARTMENT_OTHER): Payer: Self-pay

## 2023-03-09 ENCOUNTER — Encounter: Payer: Self-pay | Admitting: Pain Medicine

## 2023-03-09 ENCOUNTER — Ambulatory Visit
Admission: RE | Admit: 2023-03-09 | Discharge: 2023-03-09 | Disposition: A | Discharge status: Home / Self Care | Payer: BC Managed Care – PPO | Source: Ambulatory Visit | Attending: Pain Medicine | Admitting: Pain Medicine

## 2023-03-09 ENCOUNTER — Other Ambulatory Visit: Payer: Self-pay

## 2023-03-09 ENCOUNTER — Ambulatory Visit: Payer: BC Managed Care – PPO | Attending: Pain Medicine | Admitting: Pain Medicine

## 2023-03-09 VITALS — BP 129/74 | HR 73 | Temp 97.2°F | Resp 17 | Ht 64.0 in | Wt 177.0 lb

## 2023-03-09 DIAGNOSIS — G8918 Other acute postprocedural pain: Secondary | ICD-10-CM | POA: Diagnosis not present

## 2023-03-09 DIAGNOSIS — M5137 Other intervertebral disc degeneration, lumbosacral region: Secondary | ICD-10-CM | POA: Diagnosis not present

## 2023-03-09 DIAGNOSIS — M47817 Spondylosis without myelopathy or radiculopathy, lumbosacral region: Secondary | ICD-10-CM | POA: Diagnosis not present

## 2023-03-09 DIAGNOSIS — M5459 Other low back pain: Secondary | ICD-10-CM | POA: Insufficient documentation

## 2023-03-09 DIAGNOSIS — M4310 Spondylolisthesis, site unspecified: Secondary | ICD-10-CM | POA: Insufficient documentation

## 2023-03-09 DIAGNOSIS — M47816 Spondylosis without myelopathy or radiculopathy, lumbar region: Secondary | ICD-10-CM | POA: Insufficient documentation

## 2023-03-09 DIAGNOSIS — M545 Low back pain, unspecified: Secondary | ICD-10-CM | POA: Diagnosis not present

## 2023-03-09 DIAGNOSIS — M4317 Spondylolisthesis, lumbosacral region: Secondary | ICD-10-CM

## 2023-03-09 DIAGNOSIS — Z09 Encounter for follow-up examination after completed treatment for conditions other than malignant neoplasm: Secondary | ICD-10-CM | POA: Insufficient documentation

## 2023-03-09 DIAGNOSIS — G8929 Other chronic pain: Secondary | ICD-10-CM | POA: Diagnosis not present

## 2023-03-09 MED ORDER — MIDAZOLAM HCL 5 MG/5ML IJ SOLN
0.5000 mg | Freq: Once | INTRAMUSCULAR | Status: AC
  Administered 2023-03-09 (×2): 1 mg via INTRAVENOUS
  Administered 2023-03-09: 1.5 mg via INTRAVENOUS

## 2023-03-09 MED ORDER — LIDOCAINE HCL 2 % IJ SOLN
INTRAMUSCULAR | Status: AC
  Filled 2023-03-09: qty 20

## 2023-03-09 MED ORDER — LIDOCAINE HCL 2 % IJ SOLN
20.0000 mL | Freq: Once | INTRAMUSCULAR | Status: AC
  Administered 2023-03-09: 400 mg

## 2023-03-09 MED ORDER — MIDAZOLAM HCL 5 MG/5ML IJ SOLN
INTRAMUSCULAR | Status: AC
  Filled 2023-03-09: qty 5

## 2023-03-09 MED ORDER — TRIAMCINOLONE ACETONIDE 40 MG/ML IJ SUSP
INTRAMUSCULAR | Status: AC
  Filled 2023-03-09: qty 1

## 2023-03-09 MED ORDER — ROPIVACAINE HCL 2 MG/ML IJ SOLN
9.0000 mL | Freq: Once | INTRAMUSCULAR | Status: AC
  Administered 2023-03-09: 9 mL via PERINEURAL

## 2023-03-09 MED ORDER — FENTANYL CITRATE (PF) 100 MCG/2ML IJ SOLN
25.0000 ug | INTRAMUSCULAR | Status: DC | PRN
  Administered 2023-03-09: 50 ug via INTRAVENOUS

## 2023-03-09 MED ORDER — OXYCODONE-ACETAMINOPHEN 5-325 MG PO TABS
1.0000 | ORAL_TABLET | Freq: Three times a day (TID) | ORAL | 0 refills | Status: DC | PRN
Start: 2023-03-09 — End: 2023-04-20
  Filled 2023-03-09: qty 21, 7d supply, fill #0

## 2023-03-09 MED ORDER — ROPIVACAINE HCL 2 MG/ML IJ SOLN
INTRAMUSCULAR | Status: AC
  Filled 2023-03-09: qty 20

## 2023-03-09 MED ORDER — TRIAMCINOLONE ACETONIDE 40 MG/ML IJ SUSP
40.0000 mg | Freq: Once | INTRAMUSCULAR | Status: AC
  Administered 2023-03-09: 40 mg

## 2023-03-09 MED ORDER — PENTAFLUOROPROP-TETRAFLUOROETH EX AERO
INHALATION_SPRAY | Freq: Once | CUTANEOUS | Status: AC
  Administered 2023-03-09: 30 via TOPICAL
  Filled 2023-03-09: qty 30

## 2023-03-09 MED ORDER — FENTANYL CITRATE (PF) 100 MCG/2ML IJ SOLN
INTRAMUSCULAR | Status: AC
  Filled 2023-03-09: qty 2

## 2023-03-09 MED ORDER — LACTATED RINGERS IV SOLN: Freq: Once | INTRAVENOUS | Status: AC

## 2023-03-09 MED ORDER — OXYCODONE-ACETAMINOPHEN 5-325 MG PO TABS
1.0000 | ORAL_TABLET | Freq: Three times a day (TID) | ORAL | 0 refills | Status: DC | PRN
Start: 2023-03-16 — End: 2023-04-20
  Filled 2023-03-16: qty 21, 7d supply, fill #0

## 2023-03-09 NOTE — Patient Instructions (Signed)

## 2023-03-09 NOTE — Progress Notes (Signed)
Safety precautions to be maintained throughout the outpatient stay will include: orient to surroundings, keep bed in low position, maintain call bell within reach at all times, provide assistance with transfer out of bed and ambulation.  

## 2023-03-10 ENCOUNTER — Telehealth: Payer: Self-pay

## 2023-03-10 NOTE — Telephone Encounter (Signed)
Post procedure follow up.  Patient states she is doing well.   ?

## 2023-03-16 ENCOUNTER — Other Ambulatory Visit: Payer: Self-pay

## 2023-03-20 NOTE — Patient Instructions (Signed)
 ____________________________________________________________________________________________  Opioid Pain Medication Update  To: All patients taking opioid pain medications. (I.e.: hydrocodone, hydromorphone, oxycodone, oxymorphone, morphine, codeine, methadone, tapentadol, tramadol, buprenorphine, fentanyl, etc.)  Re: Updated review of side effects and adverse reactions of opioid analgesics, as well as new information about long term effects of this class of medications.  Direct risks of long-term opioid therapy are not limited to opioid addiction and overdose. Potential medical risks include serious fractures, breathing problems during sleep, hyperalgesia, immunosuppression, chronic constipation, bowel obstruction, myocardial infarction, and tooth decay secondary to xerostomia.  Unpredictable adverse effects that can occur even if you take your medication correctly: Cognitive impairment, respiratory depression, and death. Most people think that if they take their medication "correctly", and "as instructed", that they will be safe. Nothing could be farther from the truth. In reality, a significant amount of recorded deaths associated with the use of opioids has occurred in individuals that had taken the medication for a long time, and were taking their medication correctly. The following are examples of how this can happen: Patient taking his/her medication for a long time, as instructed, without any side effects, is given a certain antibiotic or another unrelated medication, which in turn triggers a "Drug-to-drug interaction" leading to disorientation, cognitive impairment, impaired reflexes, respiratory depression or an untoward event leading to serious bodily harm or injury, including death.  Patient taking his/her medication for a long time, as instructed, without any side effects, develops an acute impairment of liver and/or kidney function. This will lead to a rapid inability of the body to  breakdown and eliminate their pain medication, which will result in effects similar to an "overdose", but with the same medicine and dose that they had always taken. This again may lead to disorientation, cognitive impairment, impaired reflexes, respiratory depression or an untoward event leading to serious bodily harm or injury, including death.  A similar problem will occur with patients as they grow older and their liver and kidney function begins to decrease as part of the aging process.  Background information: Historically, the original case for using long-term opioid therapy to treat chronic noncancer pain was based on safety assumptions that subsequent experience has called into question. In 1996, the American Pain Society and the American Academy of Pain Medicine issued a consensus statement supporting long-term opioid therapy. This statement acknowledged the dangers of opioid prescribing but concluded that the risk for addiction was low; respiratory depression induced by opioids was short-lived, occurred mainly in opioid-naive patients, and was antagonized by pain; tolerance was not a common problem; and efforts to control diversion should not constrain opioid prescribing. This has now proven to be wrong. Experience regarding the risks for opioid addiction, misuse, and overdose in community practice has failed to support these assumptions.  According to the Centers for Disease Control and Prevention, fatal overdoses involving opioid analgesics have increased sharply over the past decade. Currently, more than 96,700 people die from drug overdoses every year. Opioids are a factor in 7 out of every 10 overdose deaths. Deaths from drug overdose have surpassed motor vehicle accidents as the leading cause of death for individuals between the ages of 66 and 14.  Clinical data suggest that neuroendocrine dysfunction may be very common in both men and women, potentially causing hypogonadism, erectile  dysfunction, infertility, decreased libido, osteoporosis, and depression. Recent studies linked higher opioid dose to increased opioid-related mortality. Controlled observational studies reported that long-term opioid therapy may be associated with increased risk for cardiovascular events. Subsequent meta-analysis concluded  that the safety of long-term opioid therapy in elderly patients has not been proven.   Side Effects and adverse reactions: Common side effects: Drowsiness (sedation). Dizziness. Nausea and vomiting. Constipation. Physical dependence -- Dependence often manifests with withdrawal symptoms when opioids are discontinued or decreased. Tolerance -- As you take repeated doses of opioids, you require increased medication to experience the same effect of pain relief. Respiratory depression -- This can occur in healthy people, especially with higher doses. However, people with COPD, asthma or other lung conditions may be even more susceptible to fatal respiratory impairment.  Uncommon side effects: An increased sensitivity to feeling pain and extreme response to pain (hyperalgesia). Chronic use of opioids can lead to this. Delayed gastric emptying (the process by which the contents of your stomach are moved into your small intestine). Muscle rigidity. Immune system and hormonal dysfunction. Quick, involuntary muscle jerks (myoclonus). Arrhythmia. Itchy skin (pruritus). Dry mouth (xerostomia).  Long-term side effects: Chronic constipation. Sleep-disordered breathing (SDB). Increased risk of bone fractures. Hypothalamic-pituitary-adrenal dysregulation. Increased risk of overdose.  RISKS: Respiratory depression and death: Opioids increase the risk of respiratory depression and death.  Drug-to-drug interactions: Opioids are relatively contraindicated in combination with benzodiazepines, sleep inducers, and other central nervous system depressants. Other classes of medications  (i.e.: certain antibiotics and even over-the-counter medications) may also trigger or induce respiratory depression in some patients.  Medical conditions: Patients with pre-existing respiratory problems are at higher risk of respiratory failure and/or depression when in combination with opioid analgesics. Opioids are relatively contraindicated in some medical conditions such as central sleep apnea.   Fractures and Falls:  Opioids increase the risk and incidence of falls. This is of particular importance in elderly patients.  Endocrine System:  Long-term administration is associated with endocrine abnormalities (endocrinopathies). (Also known as Opioid-induced Endocrinopathy) Influences on both the hypothalamic-pituitary-adrenal axis?and the hypothalamic-pituitary-gonadal axis have been demonstrated with consequent hypogonadism and adrenal insufficiency in both sexes. Hypogonadism and decreased levels of dehydroepiandrosterone sulfate have been reported in men and women. Endocrine effects include: Amenorrhoea in women (abnormal absence of menstruation) Reduced libido in both sexes Decreased sexual function Erectile dysfunction in men Hypogonadisms (decreased testicular function with shrinkage of testicles) Infertility Depression and fatigue Loss of muscle mass Anxiety Depression Immune suppression Hyperalgesia Weight gain Anemia Osteoporosis Patients (particularly women of childbearing age) should avoid opioids. There is insufficient evidence to recommend routine monitoring of asymptomatic patients taking opioids in the long-term for hormonal deficiencies.  Immune System: Human studies have demonstrated that opioids have an immunomodulating effect. These effects are mediated via opioid receptors both on immune effector cells and in the central nervous system. Opioids have been demonstrated to have adverse effects on antimicrobial response and anti-tumour surveillance. Buprenorphine has  been demonstrated to have no impact on immune function.  Opioid Induced Hyperalgesia: Human studies have demonstrated that prolonged use of opioids can lead to a state of abnormal pain sensitivity, sometimes called opioid induced hyperalgesia (OIH). Opioid induced hyperalgesia is not usually seen in the absence of tolerance to opioid analgesia. Clinically, hyperalgesia may be diagnosed if the patient on long-term opioid therapy presents with increased pain. This might be qualitatively and anatomically distinct from pain related to disease progression or to breakthrough pain resulting from development of opioid tolerance. Pain associated with hyperalgesia tends to be more diffuse than the pre-existing pain and less defined in quality. Management of opioid induced hyperalgesia requires opioid dose reduction.  Cancer: Chronic opioid therapy has been associated with an increased risk of cancer  among noncancer patients with chronic pain. This association was more evident in chronic strong opioid users. Chronic opioid consumption causes significant pathological changes in the small intestine and colon. Epidemiological studies have found that there is a link between opium dependence and initiation of gastrointestinal cancers. Cancer is the second leading cause of death after cardiovascular disease. Chronic use of opioids can cause multiple conditions such as GERD, immunosuppression and renal damage as well as carcinogenic effects, which are associated with the incidence of cancers.   Mortality: Long-term opioid use has been associated with increased mortality among patients with chronic non-cancer pain (CNCP).  Prescription of long-acting opioids for chronic noncancer pain was associated with a significantly increased risk of all-cause mortality, including deaths from causes other than overdose.  Reference: Von Korff M, Kolodny A, Deyo RA, Chou R. Long-term opioid therapy reconsidered. Ann Intern Med. 2011  Sep 6;155(5):325-8. doi: 10.7326/[id number]. PMID: 16109604; PMCID: [id number]. Randon Goldsmith, Hayward RA, Dunn KM, [place] KP. Risk of adverse events in patients prescribed long-term opioids: A cohort study in the [place] [facility] Research Datalink. Eur J Pain. 2019 May;23(5):908-922. doi: 10.1002/ejp.1357. Epub 2019 Jan 31. PMID: 78295621. Colameco S, Coren JS, Ci[place]o CA. Continuous opioid treatment for chronic noncancer pain: a time for moderation in prescribing. Postgrad Med. 2009 Jul;121(4):61-6. doi: 10.3810/pgm.2009.07.2032. PMID: 30865784. William Hamburger RN, [place], Blazina I, Cristopher Peru, Bougatsos C, Deyo RA. The effectiveness and risks of long-term opioid therapy for chronic pain: a systematic review for a [employer] of [employer] Pathways to [employer]. Ann Intern Med. 2015 Feb 17;162(4):276-86. doi: 10.7326/M14-2559. PMID: 69629528. Caryl Bis [facility], [place] DM. NCHS Data Brief No. 22. [place]: Centers for Disease Control and Prevention; 2009. Sep, Increase in Fatal Poisonings Involving Opioid Analgesics in the [place], 1999-2006. Song IA, Choi HR, Oh TK. Long-term opioid use and mortality in patients with chronic non-cancer pain: Ten-year follow-up study in [place] from 2010 through 2019. EClinicalMedicine. 2022 Jul 18;51:101558. doi: 10.1016/j.eclinm.2022.413244. PMID: 01027253; PMCID: [id number]. Huser, W., Schubert, T., Vogelmann, T. et al. All-cause mortality in patients with long-term opioid therapy compared with non-opioid analgesics for chronic non-cancer pain: a database study. BMC Med 18, 162 (2020). http://lester.info/ Rashidian H, Karie Kirks, Malekzadeh R, Haghdoost AA. An Ecological Study of the Association between Opiate Use and Incidence of Cancers. Addict Health. 2016 Fall;8(4):252-260. PMID: 59563875; PMCID: [id number].  Our Goal: Our goal is to control your  pain with means other than the use of opioid pain medications.  Our Recommendation: Talk to your physician about coming off of these medications. We can assist you with the tapering down and stopping these medicines. Based on the new information, even if you cannot completely stop the medication, a decrease in the dose may be associated with a lesser risk. Ask for other means of controlling the pain. Decrease or eliminate those factors that significantly contribute to your pain such as smoking, obesity, and a diet heavily tilted towards "inflammatory" nutrients.  Last Updated: 02/06/2023   ____________________________________________________________________________________________     ____________________________________________________________________________________________  National Pain Medication Shortage  The [place] is experiencing worsening drug shortages. These have had a negative widespread effect on patient care and treatment. Not expected to improve any time soon. Predicted to last past 2029.   Drug shortage list (generic names) Oxycodone IR Oxycodone/APAP Oxymorphone IR Hydromorphone Hydrocodone/APAP Morphine  Where is the problem?  Manufacturing and supply level.  Will this shortage affect you?  Only if you  take any of the above pain medications.  How? You may be unable to fill your prescription.  Your pharmacist may offer a "partial fill" of your prescription. (Warning: Do not accept partial fills.) Prescriptions partially filled cannot be transferred to another pharmacy. Read our Medication Rules and Regulation. Depending on how much medicine you are dependent on, you may experience withdrawals when unable to get the medication.  Recommendations: Consider ending your dependence on opioid pain medications. Ask your pain specialist to assist you with the process. Consider switching to a medication currently not in shortage, such as Buprenorphine. Talk to your pain  specialist about this option. Consider decreasing your pain medication requirements by managing tolerance thru "Drug Holidays". This may help minimize withdrawals, should you run out of medicine. Control your pain thru the use of non-pharmacological interventional therapies.   Your prescriber: Prescribers cannot be blamed for shortages. Medication manufacturing and supply issues cannot be fixed by the prescriber.   NOTE: The prescriber is not responsible for supplying the medication, or solving supply issues. Work with your pharmacist to solve it. The patient is responsible for the decision to take or continue taking the medication and for identifying and securing a legal supply source. By law, supplying the medication is the job and responsibility of the pharmacy. The prescriber is responsible for the evaluation, monitoring, and prescribing of these medications.   Prescribers will NOT: Re-issue prescriptions that have been partially filled. Re-issue prescriptions already sent to a pharmacy.  Re-send prescriptions to a different pharmacy because yours did not have your medication. Ask pharmacist to order more medicine or transfer the prescription to another pharmacy. (Read below.)  New 2023 regulation: "April 01, 2022 Revised Regulation Allows DEA-Registered Pharmacies to Transfer Electronic Prescriptions at a Patient's Request DEA Headquarters Division - Public Information Office Patients now have the ability to request their electronic prescription be transferred to another pharmacy without having to go back to their practitioner to initiate the request. This revised regulation went into effect on Monday, March 28, 2022.     At a patient's request, a DEA-registered retail pharmacy can now transfer an electronic prescription for a controlled substance (schedules II-V) to another DEA-registered retail pharmacy. Prior to this change, patients would have to go through their practitioner to  cancel their prescription and have it re-issued to a different pharmacy. The process was taxing and time consuming for both patients and practitioners.    The Drug Enforcement Administration [facility]) published its intent to revise the process for transferring electronic prescriptions on June 19, 2020.  The final rule was published in the federal register on February 24, 2022 and went into effect 30 days later.  Under the final rule, a prescription can only be transferred once between pharmacies, and only if allowed under existing state or other applicable law. The prescription must remain in its electronic form; may not be altered in any way; and the transfer must be communicated directly between two licensed pharmacists. It's important to note, any authorized refills transfer with the original prescription, which means the entire prescription will be filled at the same pharmacy".  Reference: [email or web address] [facility] website announcement)  [email or web address] [employer] of [employer])   [employer] / Vol. 88, No. 143 / Thursday, February 24, 2022 / Rules and Regulations DEPARTMENT OF JUSTICE  Drug Enforcement Administration  21 CFR Part 1306  [Docket No. [id number]]  RIN [id number] Transfer of Electronic Prescriptions for Schedules II-V Controlled Substances Between Pharmacies for Initial Filling  ____________________________________________________________________________________________  ____________________________________________________________________________________________  Transfer of Pain Medication between Pharmacies  Re: 2023 DEA Clarification on existing regulation  Published on DEA Website: April 01, 2022  Title: Revised Regulation Allows DEA-Registered Pharmacies to Electrical engineer Prescriptions at a Patient's  Request DEA Headquarters Division - [employer]  "Patients now have the ability to request their electronic prescription be transferred to another pharmacy without having to go back to their practitioner to initiate the request. This revised regulation went into effect on Monday, March 28, 2022.     At a patient's request, a DEA-registered retail pharmacy can now transfer an electronic prescription for a controlled substance (schedules II-V) to another DEA-registered retail pharmacy. Prior to this change, patients would have to go through their practitioner to cancel their prescription and have it re-issued to a different pharmacy. The process was taxing and time consuming for both patients and practitioners.    The Drug Enforcement Administration [facility]) published its intent to revise the process for transferring electronic prescriptions on June 19, 2020.  The final rule was published in the federal register on February 24, 2022 and went into effect 30 days later.  Under the final rule, a prescription can only be transferred once between pharmacies, and only if allowed under existing state or other applicable law. The prescription must remain in its electronic form; may not be altered in any way; and the transfer must be communicated directly between two licensed pharmacists. It's important to note, any authorized refills transfer with the original prescription, which means the entire prescription will be filled at the same pharmacy."    REFERENCES: 1. DEA website announcement [email or web address]  2. Department of Justice website  [email or web address]  3. DEPARTMENT OF JUSTICE Drug Enforcement Administration 21 CFR Part 1306 [Docket No. [id number]] RIN [id number] "Transfer of Electronic Prescriptions for Schedules II-V Controlled Substances  Between Pharmacies for Initial Filling"  ____________________________________________________________________________________________     _______________________________________________________________________  Medication Rules  Purpose: To inform patients, and their family members, of our medication rules and regulations.  Applies to: All patients receiving prescriptions from our practice (written or electronic).  Pharmacy of record: This is the pharmacy where your electronic prescriptions will be sent. Make sure we have the correct one.  Electronic prescriptions: In compliance with the [facility] Strengthen Opioid Misuse Prevention (STOP) Act of 2017 (Session Conni Elliot [phone]), effective August 01, 2018, all controlled substances must be electronically prescribed. Written prescriptions, faxing, or calling prescriptions to a pharmacy will no longer be done.  Prescription refills: These will be provided only during in-person appointments. No medications will be renewed without a "face-to-face" evaluation with your provider. Applies to all prescriptions.  NOTE: The following applies primarily to controlled substances (Opioid* Pain Medications).   Type of encounter (visit): For patients receiving controlled substances, face-to-face visits are required. (Not an option and not up to the patient.)  Patient's responsibilities: Pain Pills: Bring all pain pills to every appointment (except for procedure appointments). Pill Bottles: Bring pills in original pharmacy bottle. Bring bottle, even if empty. Always bring the bottle of the most recent fill.  Medication refills: You are responsible for knowing and keeping track of what medications you are taking and when is it that you will need a refill. The day before your appointment: write a list of all prescriptions that need to be refilled. The day of the appointment: give the list to the admitting nurse. Prescriptions will be written only  during appointments. No prescriptions will be written on procedure days. If you forget a  medication: it will not be "Called in", "Faxed", or "electronically sent". You will need to get another appointment to get these prescribed. No early refills. Do not call asking to have your prescription filled early. Partial  or short prescriptions: Occasionally your pharmacy may not have enough pills to fill your prescription.  NEVER ACCEPT a partial fill or a prescription that is short of the total amount of pills that you were prescribed.  With controlled substances the law allows 72 hours for the pharmacy to complete the prescription.  If the prescription is not completed within 72 hours, the pharmacist will require a new prescription to be written. This means that you will be short on your medicine and we WILL NOT send another prescription to complete your original prescription.  Instead, request the pharmacy to send a carrier to a nearby branch to get enough medication to provide you with your full prescription. Prescription Accuracy: You are responsible for carefully inspecting your prescriptions before leaving our office. Have the discharge nurse carefully go over each prescription with you, before taking them home. Make sure that your name is accurately spelled, that your address is correct. Check the name and dose of your medication to make sure it is accurate. Check the number of pills, and the written instructions to make sure they are clear and accurate. Make sure that you are given enough medication to last until your next medication refill appointment. Taking Medication: Take medication as prescribed. When it comes to controlled substances, taking less pills or less frequently than prescribed is permitted and encouraged. Never take more pills than instructed. Never take the medication more frequently than prescribed.  Inform other Doctors: Always inform, all of your healthcare providers, of all the  medications you take. Pain Medication from other Providers: You are not allowed to accept any additional pain medication from any other Doctor or Healthcare provider. There are two exceptions to this rule. (see below) In the event that you require additional pain medication, you are responsible for notifying us, as stated below. Cough Medicine: Often these contain an opioid, such as codeine or hydrocodone. Never accept or take cough medicine containing these opioids if you are already taking an opioid* medication. The combination may cause respiratory failure and death. Medication Agreement: You are responsible for carefully reading and following our Medication Agreement. This must be signed before receiving any prescriptions from our practice. Safely store a copy of your signed Agreement. Violations to the Agreement will result in no further prescriptions. (Additional copies of our Medication Agreement are available upon request.) Laws, Rules, & Regulations: All patients are expected to follow all [address] and [employer], Rules, [facility] [employer] [place] [employer]. Ignorance of the Laws does not constitute a valid excuse.  Illegal drugs and Controlled Substances: The use of illegal substances (including, but not limited to marijuana and its derivatives) and/or the illegal use of any controlled substances is strictly prohibited. Violation of this rule may result in the immediate and permanent discontinuation of any and all prescriptions being written by our practice. The use of any illegal substances is prohibited. Adopted CDC guidelines & recommendations: Target dosing levels will be at or below 60 MME/day. Use of benzodiazepines** is not recommended.  Exceptions: There are only two exceptions to the rule of not receiving pain medications from other Healthcare Providers. Exception #1 (Emergencies): In the event of an emergency (i.e.: accident requiring emergency care), you are allowed to receive additional pain  medication. However, you are responsible for: As soon as  you are able, call our office [phone], at any time of the day or night, and leave a message stating your name, the date and nature of the emergency, and the name and dose of the medication prescribed. In the event that your call is answered by a member of our staff, make sure to document and save the date, time, and the name of the person that took your information.  Exception #2 (Planned Surgery): In the event that you are scheduled by another doctor or dentist to have any type of surgery or procedure, you are allowed (for a period no longer than 30 days), to receive additional pain medication, for the acute post-op pain. However, in this case, you are responsible for picking up a copy of our "Post-op Pain Management for Surgeons" handout, and giving it to your surgeon or dentist. This document is available at our office, and does not require an appointment to obtain it. Simply go to our office during business hours (Monday-Thursday from 8:00 AM to 4:00 PM) (Friday 8:00 AM to 12:00 Noon) or if you have a scheduled appointment with [place], prior to your surgery, and ask for it by name. In addition, you are responsible for: calling our office [phone], at any time of the day or night, and leaving a message stating your name, name of your surgeon, type of surgery, and date of procedure or surgery. Failure to comply with your responsibilities may result in termination of therapy involving the controlled substances. Medication Agreement Violation. Following the above rules, including your responsibilities will help you in avoiding a Medication Agreement Violation ("Breaking your Pain Medication Contract").  Consequences:  Not following the above rules may result in permanent discontinuation of medication prescription therapy.  *Opioid medications include: morphine, codeine, oxycodone, oxymorphone, hydrocodone, hydromorphone, meperidine, tramadol,  tapentadol, buprenorphine, fentanyl, methadone. **Benzodiazepine medications include: diazepam (Valium), alprazolam (Xanax), clonazepam (Klonopine), lorazepam (Ativan), clorazepate (Tranxene), chlordiazepoxide (Librium), estazolam (Prosom), oxazepam (Serax), temazepam (Restoril), triazolam (Halcion) (Last updated: 05/24/2022) ______________________________________________________________________    ______________________________________________________________________  Medication Recommendations and Reminders  Applies to: All patients receiving prescriptions (written and/or electronic).  Medication Rules & Regulations: You are responsible for reading, knowing, and following our "Medication Rules" document. These exist for your safety and that of others. They are not flexible and neither are we. Dismissing or ignoring them is an act of "non-compliance" that may result in complete and irreversible termination of such medication therapy. For safety reasons, "non-compliance" will not be tolerated. As with the [place] fundamental legal principle of "ignorance of the law is no defense", we will accept no excuses for not having read and knowing the content of documents provided to you by our practice.  Pharmacy of record:  Definition: This is the pharmacy where your electronic prescriptions will be sent.  We do not endorse any particular pharmacy. It is up to you and your insurance to decide what pharmacy to use.  We do not restrict you in your choice of pharmacy. However, once we write for your prescriptions, we will NOT be re-sending more prescriptions to fix restricted supply problems created by your pharmacy, or your insurance.  The pharmacy listed in the electronic medical record should be the one where you want electronic prescriptions to be sent. If you choose to change pharmacy, simply notify our nursing staff. Changes will be made only during your regular appointments and not over the  phone.  Recommendations: Keep all of your pain medications in a safe place, under lock and key, even  if you live alone. We will NOT replace lost, stolen, or damaged medication. We do not accept "Police Reports" as proof of medications having been stolen. After you fill your prescription, take 1 week's worth of pills and put them away in a safe place. You should keep a separate, properly labeled bottle for this purpose. The remainder should be kept in the original bottle. Use this as your primary supply, until it runs out. Once it's gone, then you know that you have 1 week's worth of medicine, and it is time to come in for a prescription refill. If you do this correctly, it is unlikely that you will ever run out of medicine. To make sure that the above recommendation works, it is very important that you make sure your medication refill appointments are scheduled at least 1 week before you run out of medicine. To do this in an effective manner, make sure that you do not leave the office without scheduling your next medication management appointment. Always ask the nursing staff to show you in your prescription , when your medication will be running out. Then arrange for the receptionist to get you a return appointment, at least 7 days before you run out of medicine. Do not wait until you have 1 or 2 pills left, to come in. This is very poor planning and does not take into consideration that we may need to cancel appointments due to bad weather, sickness, or emergencies affecting our staff. DO NOT ACCEPT A "Partial Fill": If for any reason your pharmacy does not have enough pills/tablets to completely fill or refill your prescription, do not allow for a "partial fill". The law allows the pharmacy to complete that prescription within 72 hours, without requiring a new prescription. If they do not fill the rest of your prescription within those 72 hours, you will need a separate prescription to fill the remaining  amount, which we will NOT provide. If the reason for the partial fill is your insurance, you will need to talk to the pharmacist about payment alternatives for the remaining tablets, but again, DO NOT ACCEPT A PARTIAL FILL, unless you can trust your pharmacist to obtain the remainder of the pills within 72 hours.  Prescription refills and/or changes in medication(s):  Prescription refills, and/or changes in dose or medication, will be conducted only during scheduled medication management appointments. (Applies to both, written and electronic prescriptions.) No refills on procedure days. No medication will be changed or started on procedure days. No changes, adjustments, and/or refills will be conducted on a procedure day. Doing so will interfere with the diagnostic portion of the procedure. No phone refills. No medications will be "called into the pharmacy". No Fax refills. No weekend refills. No Holliday refills. No after hours refills.  Remember:  Business hours are:  Monday to Thursday 8:00 AM to 4:00 PM Provider's Schedule: Delano Metz, MD - Appointments are:  Medication management: Monday and Wednesday 8:00 AM to 4:00 PM Procedure day: Tuesday and Thursday 7:30 AM to 4:00 PM Edward Jolly, MD - Appointments are:  Medication management: Tuesday and Thursday 8:00 AM to 4:00 PM Procedure day: Monday and Wednesday 7:30 AM to 4:00 PM (Last update: 05/24/2022) ______________________________________________________________________    ____________________________________________________________________________________________  Drug Holidays  What is a "Drug Holiday"? Drug Holiday: is the name given to the process of slowly tapering down and temporarily stopping the pain medication for the purpose of decreasing or eliminating tolerance to the drug.  Benefits Improved effectiveness Decreased required effective  dose Improved pain control End dependence on high dose  therapy Decrease cost of therapy Uncovering "opioid-induced hyperalgesia". (OIH)  What is "opioid hyperalgesia"? It is a paradoxical increase in pain caused by exposure to opioids. Stopping the opioid pain medication, contrary to the expected, it actually decreases or completely eliminates the pain. Ref.: "A comprehensive review of opioid-induced hyperalgesia". Donney Rankins, et.al. Pain Physician. 2011 Mar-Apr;14(2):145-61.  What is tolerance? Tolerance: the progressive loss of effectiveness of a pain medicine due to repetitive use. A common problem of opioid pain medications.  How long should a "Drug Holiday" last? Effectiveness depends on the patient staying off all opioid pain medicines for a minimum of 14 consecutive days. (2 weeks)  How about just taking less of the medicine? Does not work. Will not accomplish goal of eliminating the excess receptors.  How about switching to a different pain medicine? (AKA. "Opioid rotation") Does not work. Creates the illusion of effectiveness by taking advantage of inaccurate equivalent dose calculations between different opioids. -This "technique" was promoted by studies funded by [employer], such as [employer], creators of "OxyContin".  Can I stop the medicine "cold [place]"? We do not recommend it. You should always coordinate with your prescribing physician to make the transition as smoothly as possible. Avoid stopping the medicine abruptly without consulting. We recommend a "slow taper".  What is a slow taper? Taper: refers to the gradual decrease in dose.   How do I stop/taper the dose? Slowly. Decrease the daily amount of pills that you take by one (1) pill every seven (7) days. This is called a "slow downward taper". Example: if you normally take four (4) pills per day, drop it to three (3) pills per day for seven (7) days, then to two (2) pills per day for seven (7) days, then to one (1) per day for seven (7) days, and then  stop the medicine. The 14 day "Drug Holiday" starts on the first day without medicine.   Will I experience withdrawals? Unlikely with a slow taper.  What triggers withdrawals? Withdrawals are triggered by the sudden/abrupt stop of high dose opioids. Withdrawals can be avoided by slowly decreasing the dose over a prolonged period of time.  What are withdrawals? Symptoms associated with sudden/abrupt reduction/stopping of high-dose, long-term use of pain medication. Withdrawal are seldom seen on low dose therapy, or patients rarely taking opioid medication.  Early Withdrawal Symptoms may include: Agitation Anxiety Muscle aches Increased tearing Insomnia Runny nose Sweating Yawning  Late symptoms may include: Abdominal cramping Diarrhea Dilated pupils Goose bumps Nausea Vomiting  When could I see withdrawals? Onset: 8-24 hours after last use for most opioids. 12-48 hours for long-acting opioids (i.e.: methadone)  How long could they last? Duration: 4-10 days for most opioids. 14-21 days for long-acting opioids (i.e.: methadone)  What will happen after I complete my "Drug Holiday"? The need and indications for the opioid analgesic will be reviewed before restarting the medication. Dose requirements will likely decrease and the dose will need to be adjusted accordingly.   (Last update: 10/19/2022) ____________________________________________________________________________________________   ____________________________________________________________________________________________  Naloxone Nasal Spray  Why am I receiving this medication? [place] STOP ACT requires that all patients taking high dose opioids or at risk of opioids respiratory depression, be prescribed an opioid reversal agent, such as Naloxone (AKA: Narcan).  What is this medication? NALOXONE (nal OX one) treats opioid overdose, which causes slow or shallow breathing, severe drowsiness, or trouble  staying awake. Call emergency services after using  this medication. You may need additional treatment. Naloxone works by reversing the effects of opioids. It belongs to a group of medications called opioid blockers.  COMMON BRAND NAME(S): Kloxxado, Narcan  What should I tell my care team before I take this medication? They need to know if you have any of these conditions: Heart disease Substance use disorder An unusual or allergic reaction to naloxone, other medications, foods, dyes, or preservatives Pregnant or trying to get pregnant Breast-feeding  When to use this medication? This medication is to be used for the treatment of respiratory depression (less than 8 breaths per minute) secondary to opioid overdose.   How to use this medication? This medication is for use in the nose. Lay the person on their back. Support their neck with your hand and allow the head to tilt back before giving the medication. The nasal spray should be given into 1 nostril. After giving the medication, move the person onto their side. Do not remove or test the nasal spray until ready to use. Get emergency medical help right away after giving the first dose of this medication, even if the person wakes up. You should be familiar with how to recognize the signs and symptoms of a narcotic overdose. If more doses are needed, give the additional dose in the other nostril. Talk to your care team about the use of this medication in children. While this medication may be prescribed for children as young as newborns for selected conditions, precautions do apply.  Naloxone Overdosage: If you think you have taken too much of this medicine contact a poison control center or emergency room at once.  NOTE: This medicine is only for you. Do not share this medicine with others.  What if I miss a dose? This does not apply.  What may interact with this medication? This is only used during an emergency. No interactions are  expected during emergency use. This list may not describe all possible interactions. Give your health care provider a list of all the medicines, herbs, non-prescription drugs, or dietary supplements you use. Also tell them if you smoke, drink alcohol, or use illegal drugs. Some items may interact with your medicine.  What should I watch for while using this medication? Keep this medication ready for use in the case of an opioid overdose. Make sure that you have the phone number of your care team and local hospital ready. You may need to have additional doses of this medication. Each nasal spray contains a single dose. Some emergencies may require additional doses. After use, bring the treated person to the nearest hospital or call 911. Make sure the treating care team knows that the person has received a dose of this medication. You will receive additional instructions on what to do during and after use of this medication before an emergency occurs.  What side effects may I notice from receiving this medication? Side effects that you should report to your care team as soon as possible: Allergic reactions--skin rash, itching, hives, swelling of the face, lips, tongue, or throat Side effects that usually do not require medical attention (report these to your care team if they continue or are bothersome): Constipation Dryness or irritation inside the nose Headache Increase in blood pressure Muscle spasms Stuffy nose Toothache This list may not describe all possible side effects. Call your doctor for medical advice about side effects. You may report side effects to FDA at 1-800-FDA-1088.  Where should I keep my medication? Because this is  an emergency medication, you should keep it with you at all times.  Keep out of the reach of children and pets. Store between 20 and 25 degrees C (68 and 77 degrees F). Do not freeze. Throw away any unused medication after the expiration date. Keep in original  box until ready to use.  NOTE: This sheet is a summary. It may not cover all possible information. If you have questions about this medicine, talk to your doctor, pharmacist, or health care provider.   2023 Elsevier/Gold Standard (2021-03-26 00:00:00)  ____________________________________________________________________________________________

## 2023-03-20 NOTE — Progress Notes (Signed)
PROVIDER NOTE: Information contained herein reflects review and annotations entered in association with encounter. Interpretation of such information and data should be left to medically-trained personnel. Information provided to patient can be located elsewhere in the medical record under "Patient Instructions". Document created using STT-dictation technology, any transcriptional errors that may result from process are unintentional.    Patient: Denise Burns  Service Category: E/M  Provider: Oswaldo Done, MD  DOB: 01-03-1971  DOS: 03/22/2023  Referring Provider: Carlean Jews, NP  MRN: [id number]  Specialty: Interventional Pain Management  PCP: Carlean Jews, NP  Type: Established Patient  Setting: Ambulatory outpatient    Location: Office  Delivery: Face-to-face     HPI  Denise Burns, a 52 y.o. year old female, is here today because of her Chronic pain syndrome [G89.4]. Denise Burns's primary complain today is No chief complaint on file.  Pertinent problems: Denise Burns has Chronic midline low back pain with bilateral sciatica; Chronic low back pain (1ry area of Pain) (Bilateral) (R>L) w/o sciatica; Chronic sacroiliac joint pain (2ry area of Pain) (Right); Chronic pain syndrome; Abnormal MRI, lumbar spine (03/22/2018); Lumbar facet hypertrophy (Multilevel) (Bilateral); Lumbar facet syndrome (Bilateral); DDD (degenerative disc disease), lumbosacral; Spondylolisthesis (8 mm) at L5-S1 level; Lumbar Grade 1 Retrolisthesis (2 mm) of L2/L3; L5-S1 pars defect w/ spondylolisthesis (Bilateral); Lumbar pars defect (L5-S1) (Bilateral); Lumbar foraminal stenosis (Left: L2-3) (Bilateral: L5-S1); Osteoarthritis of sacroiliac joint (Bilateral); Chronic musculoskeletal pain; Neurogenic pain; Osteoarthritis of facet joint of lumbar spine; Greater trochanteric bursitis (Bilateral); Lumbar spondylosis; Spondylosis without myelopathy or radiculopathy, lumbosacral region; Chronic hip pain  (Right); Osteoarthritis of hip (Right); Lumbar radiculitis (L1/L2) (Right); Chronic groin pain (Right); Chronic hip pain (Bilateral); Enthesopathy hip region (Left); Osteoarthritis of hips (Bilateral); Kissing spine syndrome; Chronic low back pain (Midline) w/o sciatica; Trigger point with back pain (PSIS area) (Right); and Lumbar facet joint pain on their pertinent problem list. Pain Assessment: Severity of   is reported as a  /10. Location:    / . Onset:  . Quality:  . Timing:  . Modifying factor(s):  Marland Kitchen Vitals:  vitals were not taken for this visit.  BMI: Estimated body mass index is 30.38 kg/m as calculated from the following:   Height as of 03/09/23: 5\' 4"  (1.626 m).   Weight as of 03/09/23: 177 lb (80.3 kg). Last encounter: 01/25/2023. Last procedure: 03/09/2023.  Reason for encounter: both, medication management and post-procedure evaluation and assessment. ***  Routine UDS ordered today.   RTCB:   Post-procedure evaluation   Procedure: Lumbar Facet, Medial Branch Radiofrequency Ablation (RFA) #3  Laterality: Left (-LT)  Level: L2, L3, L4, L5, and S1 Medial Branch Level(s). These levels will denervate the L3-4, L4-5, and L5-S1 lumbar facet joints.  Imaging: Fluoroscopy-guided         Anesthesia: Local anesthesia (1-2% Lidocaine) Anxiolysis: IV Versed 3.5 mg Sedation: Moderate Sedation Fentanyl 1 mL (50 mcg) DOS: 03/09/2023  Performed by: Oswaldo Done, MD  Purpose: Therapeutic/Palliative Indications: Low back pain severe enough to impact quality of life or function. Indications: 1. Chronic low back pain (1ry area of Pain) (Bilateral) (R>L) w/o sciatica   2. DDD (degenerative disc disease), lumbosacral   3. Lumbar facet joint pain   4. Lumbar facet syndrome (Bilateral)   5. Lumbar Grade 1 Retrolisthesis (2 mm) of L2/L3   6. Spondylosis without myelopathy or radiculopathy, lumbosacral region    Denise Burns has been dealing with the above chronic pain for longer than three  months and has either failed to respond, was unable to tolerate, or simply did not get enough benefit from other more conservative therapies including, but not limited to: 1. Over-the-counter medications 2. Anti-inflammatory medications 3. Muscle relaxants 4. Membrane stabilizers 5. Opioids 6. Physical therapy and/or chiropractic manipulation 7. Modalities (Heat, ice, etc.) 8. Invasive techniques such as nerve blocks. Denise Burns has attained more than 50% relief of the pain from a series of diagnostic injections conducted in separate occasions.  Pain Score: Pre-procedure: 3 /10 Post-procedure: 0-No pain/10     Effectiveness:  Initial hour after procedure:   ***. Subsequent 4-6 hours post-procedure:   ***. Analgesia past initial 6 hours:   ***. Ongoing improvement:  Analgesic:  *** Function:    ***    ROM:    ***     Pharmacotherapy Assessment  Analgesic: Hydrocodone/APAP 7.5/325 1 tablet p.o. 3 times daily (22.5 mg/day of hydrocodone)  MME/day: 22.5 mg/day.   Monitoring: [place] PMP: PDMP reviewed during this encounter.       Pharmacotherapy: No side-effects or adverse reactions reported. Compliance: No problems identified. Effectiveness: Clinically acceptable.  No notes on file  No results found for: "CBDTHCR" No results found for: "D8THCCBX" No results found for: "D9THCCBX"  UDS:  Summary  Date Value Ref Range Status  03/28/2022 Note  Final    Comment:    ==================================================================== [employer] [facility] (MW) ==================================================================== Test                             Result       Flag       Units  Drug Present and Declared for Prescription Verification   Hydrocodone                    2442         EXPECTED   ng/mg creat   Hydromorphone                  467          EXPECTED   ng/mg creat   Dihydrocodeine                 227          EXPECTED   ng/mg creat   Norhydrocodone                  2628         EXPECTED   ng/mg creat    Sources of hydrocodone include scheduled prescription medications.    Hydromorphone, dihydrocodeine and norhydrocodone are expected    metabolites of hydrocodone. Hydromorphone and dihydrocodeine are    also available as scheduled prescription medications.  ==================================================================== Test                      Result    Flag   Units      Ref Range   Creatinine              105              mg/dL      >=86 ==================================================================== Declared Medications:  The flagging and interpretation on this report are based on the  following declared medications.  Unexpected results may arise from  inaccuracies in the declared medications.   **Note: The testing scope of this panel includes these medications:   Hydrocodone (Norco)   **Note: The testing scope of this  panel does not include the  following reported medications:   Acetaminophen (Norco)  Azelastine (Astelin)  Fludrocortisone (Florinef)  Fluticasone (Flonase)  Hydrocortisone (Cortef)  Hyoscyamine  Meloxicam (Mobic)  Methenamine  Methylene Blue  Pseudoephedrine (Sudafed)  Sodium Biphosphate ==================================================================== For clinical consultation, please call [phone]. ====================================================================       ROS  Constitutional: Denies any fever or chills Gastrointestinal: No reported hemesis, hematochezia, vomiting, or acute GI distress Musculoskeletal: Denies any acute onset joint swelling, redness, loss of ROM, or weakness Neurological: No reported episodes of acute onset apraxia, aphasia, dysarthria, agnosia, amnesia, paralysis, loss of coordination, or loss of consciousness  Medication Review  Azelastine HCl, HYDROcodone-acetaminophen, Urogesic-Blue, cetirizine, ergocalciferol, escitalopram, fludrocortisone,  fluticasone, hydrocortisone, lansoprazole, meloxicam, naloxone, omega-3 acid ethyl esters, oxyCODONE-acetaminophen, and pseudoephedrine  History Review  Allergy: Denise Burns is allergic to sulfacetamide sodium (acne), tramadol, lisinopril, and sulfa antibiotics. Drug: Denise Burns  reports no history of drug use. Alcohol:  reports that she does not currently use alcohol. Tobacco:  reports that she quit smoking about 28 years ago. Her smoking use included cigarettes. She has never used smokeless tobacco. Social: Denise Burns  reports that she quit smoking about 28 years ago. Her smoking use included cigarettes. She has never used smokeless tobacco. She reports that she does not currently use alcohol. She reports that she does not use drugs. Medical:  has a past medical history of Addison disease (HCC), Arthritis, Back pain, Complication of anesthesia, GERD (gastroesophageal reflux disease), and History of kidney stones. Surgical: Denise Burns  has a past surgical history that includes Cholecystectomy (2010); Polypectomy; dental work; Dilation and curettage of uterus (2010); IR NEPHROSTOMY PLACEMENT LEFT (12/14/2020); Nephrolithotomy (Left, 12/14/2020); Wisdom tooth extraction; Diagnostic laparoscopy (2004); Cystoscopy/ureteroscopy/holmium laser/stent placement (Right, 06/28/2021); Cystoscopy w/ ureteral stent removal (Right, 08/09/2021); Cystoscopy w/ retrogrades (Right, 08/09/2021); Cystoscopy/ureteroscopy/holmium laser/stent placement (Right, 08/09/2021); and Colonoscopy with propofol (N/A, 06/08/2022). Family: family history includes Alcohol abuse in her father; Arthritis in her mother; Brain cancer in her mother; Colon polyps (age of onset: 19) in her maternal grandmother; Hearing loss in her father; High blood pressure in her father and mother; Hyperlipidemia in her father and mother; Hypertension in her mother; Liver cancer in her mother; Lung cancer in her mother.  Laboratory Chemistry Profile    Renal Lab Results  Component Value Date   BUN 22 (H) 09/18/2022   CREATININE 0.72 09/18/2022   BCR 15 06/15/2022   GFR 84.36 08/26/2021   GFRAA 86 08/16/2018   GFRNONAA >60 09/18/2022    Hepatic Lab Results  Component Value Date   AST 36 09/16/2022   ALT 36 09/16/2022   ALBUMIN 4.1 09/16/2022   ALKPHOS 63 09/16/2022   LIPASE 38 09/16/2022    Electrolytes Lab Results  Component Value Date   NA 140 09/18/2022   K 4.6 09/18/2022   CL 108 09/18/2022   CALCIUM 8.7 (L) 09/18/2022   MG 2.2 09/18/2022   PHOS 4.3 09/18/2022    Bone Lab Results  Component Value Date   VD25OH 14.0 (L) 06/15/2022   25OHVITD1 67 08/16/2018   25OHVITD2 <1.0 08/16/2018   25OHVITD3 67 08/16/2018   TESTOSTERONE 1 (L) 08/13/2020    Inflammation (CRP: Acute Phase) (ESR: Chronic Phase) Lab Results  Component Value Date   CRP 2 08/16/2018   ESRSEDRATE 37 (H) 08/16/2018   LATICACIDVEN 1.9 09/16/2022         Note: Above Lab results reviewed.  Recent Imaging Review  DG PAIN CLINIC C-ARM 1-60 MIN NO REPORT  Fluoro was used, but no Radiologist interpretation will be provided.  Please refer to "NOTES" tab for provider progress note. Note: Reviewed        Physical Exam  General appearance: Well nourished, well developed, and well hydrated. In no apparent acute distress Mental status: Alert, oriented x 3 (person, place, & time)       Respiratory: No evidence of acute respiratory distress Eyes: PERLA Vitals: LMP 12/01/2020 (Approximate)  BMI: Estimated body mass index is 30.38 kg/m as calculated from the following:   Height as of 03/09/23: 5\' 4"  (1.626 m).   Weight as of 03/09/23: 177 lb (80.3 kg). Ideal: Ideal body weight: 54.7 kg (120 lb 9.5 oz) Adjusted ideal body weight: 64.9 kg (143 lb 2.5 oz)  Assessment   Diagnosis Status  1. Chronic pain syndrome   2. Chronic low back pain (1ry area of Pain) (Bilateral) (R>L) w/o sciatica   3. Chronic sacroiliac joint pain (2ry area of Pain) (Right)    4. Lumbar facet syndrome (Bilateral)   5. Chronic hip pain (Bilateral)   6. Pharmacologic therapy   7. Chronic use of opiate for therapeutic purpose   8. Encounter for medication management   9. Encounter for chronic pain management   10. Lumbar facet joint pain   11. Postop check    Controlled Controlled Controlled   Updated Problems: No problems updated.  Plan of Care  Problem-specific:  No problem-specific Assessment & Plan notes found for this encounter.  Ms. Tyeshia Stallcup has a current medication list which includes the following long-term medication(s): azelastine hcl, escitalopram, flonase sensimist, hydrocodone-acetaminophen, lansoprazole, meloxicam, omega-3 acid ethyl esters, oxycodone-acetaminophen, and oxycodone-acetaminophen.  Pharmacotherapy (Medications Ordered): No orders of the defined types were placed in this encounter.  Orders:  No orders of the defined types were placed in this encounter.  Follow-up plan:   No follow-ups on file.      Interventional Therapies  Risk  Complexity Considerations:   WNL   Planned  Pending:   Diagnostic/therapeutic bilateral lumbar facet MBB #4 + bilateral trochanteric bursa inj. #4    Under consideration:   Therapeutic bilateral lumbar facet medial branch RFA #3    Completed:   Therapeutic bilateral IA hip injection (posterolateral approach) R3L2 (09/06/2022) (100/100/90/90)  Therapeutic bilateral lumbar facet MBB x3 (09/08/2020) (4-0) (100/100/60/60)  Therapeutic right lumbar facet RFA x2 (09/09/2021) (4-2) (100/100/90/>75)  Therapeutic left lumbar facet RFA x2 (10/07/2021) (4-0) (100/100/90/>75)  Therapeutic right IA hip inj. x2 (08/20/2020) (5-1) (100/100/80/80)  Therapeutic left IA hip inj. x1 (08/20/2020) (5-1) (100/100/80/80)  Therapeutic bilateral trochanteric bursa inj. x3 (04/07/2022)  (100/100/90/90)    Therapeutic  Palliative (PRN) options:   Palliative lumbar facet MBB   Palliative lumbar facet RFA   Palliative trochanteric bursa inj.    Pharmacotherapy  Nonopioids transferred 05/11/2020: Baclofen and Mobic         Recent Visits Date Type Provider Dept  03/09/23 Procedure visit Delano Metz, MD [facility]  02/21/23 Procedure visit Delano Metz, MD [facility]  01/25/23 Office Visit Delano Metz, MD [facility]  Showing recent visits within past 90 days and meeting all other requirements Future Appointments Date Type Provider Dept  03/22/23 Appointment Delano Metz, MD [facility]  04/20/23 Appointment Delano Metz, MD [facility]  Showing future appointments within next 90 days and meeting all other requirements  I discussed the assessment and treatment plan with the patient. The patient was provided an opportunity to ask questions and  all were answered. The patient agreed with the plan and demonstrated an understanding of the instructions.  Patient advised to call back or seek an in-person evaluation if the symptoms or condition worsens.  Duration of encounter: *** minutes.  Total time on encounter, as per AMA guidelines included both the face-to-face and non-face-to-face time personally spent by the physician and/or other qualified health care professional(s) on the day of the encounter (includes time in activities that require the physician or other qualified health care professional and does not include time in activities normally performed by clinical staff). Physician's time may include the following activities when performed: Preparing to see the patient (e.g., pre-charting review of records, searching for previously ordered imaging, lab work, and nerve conduction tests) Review of prior analgesic pharmacotherapies. Reviewing PMP Interpreting ordered tests (e.g., lab work, imaging, nerve conduction tests) Performing post-procedure evaluations, including interpretation of diagnostic  procedures Obtaining and/or reviewing separately obtained history Performing a medically appropriate examination and/or evaluation Counseling and educating the patient/family/caregiver Ordering medications, tests, or procedures Referring and communicating with other health care professionals (when not separately reported) Documenting clinical information in the electronic or other health record Independently interpreting results (not separately reported) and communicating results to the patient/ family/caregiver Care coordination (not separately reported)  Note by: Oswaldo Done, MD Date: 03/22/2023; Time: 7:11 AM

## 2023-03-22 ENCOUNTER — Ambulatory Visit: Payer: BC Managed Care – PPO | Attending: Pain Medicine | Admitting: Pain Medicine

## 2023-03-22 ENCOUNTER — Encounter: Payer: Self-pay | Admitting: Pain Medicine

## 2023-03-22 ENCOUNTER — Other Ambulatory Visit: Payer: Self-pay

## 2023-03-22 VITALS — BP 138/86 | HR 93 | Temp 97.7°F | Ht 64.0 in | Wt 175.0 lb

## 2023-03-22 DIAGNOSIS — Z09 Encounter for follow-up examination after completed treatment for conditions other than malignant neoplasm: Secondary | ICD-10-CM | POA: Diagnosis not present

## 2023-03-22 DIAGNOSIS — G894 Chronic pain syndrome: Secondary | ICD-10-CM

## 2023-03-22 DIAGNOSIS — M533 Sacrococcygeal disorders, not elsewhere classified: Secondary | ICD-10-CM | POA: Diagnosis not present

## 2023-03-22 DIAGNOSIS — G8929 Other chronic pain: Secondary | ICD-10-CM

## 2023-03-22 DIAGNOSIS — M545 Low back pain, unspecified: Secondary | ICD-10-CM | POA: Insufficient documentation

## 2023-03-22 DIAGNOSIS — Z79899 Other long term (current) drug therapy: Secondary | ICD-10-CM

## 2023-03-22 DIAGNOSIS — M5459 Other low back pain: Secondary | ICD-10-CM

## 2023-03-22 DIAGNOSIS — F1120 Opioid dependence, uncomplicated: Secondary | ICD-10-CM | POA: Diagnosis not present

## 2023-03-22 DIAGNOSIS — M25551 Pain in right hip: Secondary | ICD-10-CM | POA: Insufficient documentation

## 2023-03-22 DIAGNOSIS — M47816 Spondylosis without myelopathy or radiculopathy, lumbar region: Secondary | ICD-10-CM | POA: Diagnosis not present

## 2023-03-22 DIAGNOSIS — Z79891 Long term (current) use of opiate analgesic: Secondary | ICD-10-CM

## 2023-03-22 DIAGNOSIS — M25552 Pain in left hip: Secondary | ICD-10-CM | POA: Diagnosis not present

## 2023-03-22 MED ORDER — HYDROCODONE-ACETAMINOPHEN 7.5-325 MG PO TABS
1.0000 | ORAL_TABLET | Freq: Three times a day (TID) | ORAL | 0 refills | Status: DC | PRN
Start: 2023-05-28 — End: 2023-06-18

## 2023-03-22 MED ORDER — NALOXONE HCL 4 MG/0.1ML NA LIQD
1.0000 | NASAL | 0 refills | Status: DC | PRN
Start: 2023-03-22 — End: 2024-03-19
  Filled 2023-03-22: qty 2, 1d supply, fill #0

## 2023-03-22 MED ORDER — HYDROCODONE-ACETAMINOPHEN 7.5-325 MG PO TABS: 1.0000 | ORAL_TABLET | Freq: Three times a day (TID) | ORAL | 0 refills | Status: DC | PRN

## 2023-03-22 MED ORDER — HYDROCODONE-ACETAMINOPHEN 7.5-325 MG PO TABS
1.0000 | ORAL_TABLET | Freq: Three times a day (TID) | ORAL | 0 refills | Status: DC | PRN
Start: 2023-04-28 — End: 2023-06-18

## 2023-03-22 NOTE — Progress Notes (Signed)
Safety precautions to be maintained throughout the outpatient stay will include: orient to surroundings, keep bed in low position, maintain call bell within reach at all times, provide assistance with transfer out of bed and ambulation.   Nursing Pain Medication Assessment:  Safety precautions to be maintained throughout the outpatient stay will include: orient to surroundings, keep bed in low position, maintain call bell within reach at all times, provide assistance with transfer out of bed and ambulation.  Medication Inspection Compliance: Pill count conducted under aseptic conditions, in front of the patient. Neither the pills nor the bottle was removed from the patient's sight at any time. Once count was completed pills were immediately returned to the patient in their original bottle.  Medication: Hydrocodone/APAP Pill/Patch Count:  15 of 90 pills remain Pill/Patch Appearance: Markings consistent with prescribed medication Bottle Appearance: Standard pharmacy container. Clearly labeled. Filled Date: 7 / 29 / 2024 Last Medication intake:  Today

## 2023-03-25 LAB — TOXASSURE SELECT 13 (MW), URINE

## 2023-03-28 ENCOUNTER — Other Ambulatory Visit: Payer: Self-pay | Admitting: Nurse Practitioner

## 2023-03-28 DIAGNOSIS — F411 Generalized anxiety disorder: Secondary | ICD-10-CM

## 2023-03-29 ENCOUNTER — Other Ambulatory Visit: Payer: Self-pay

## 2023-03-29 MED ORDER — ESCITALOPRAM OXALATE 5 MG PO TABS
5.0000 mg | ORAL_TABLET | Freq: Every day | ORAL | 1 refills | Status: DC
  Filled 2023-03-29 – 2023-05-27 (×2): qty 90, 90d supply, fill #0
  Filled 2023-08-26: qty 90, 90d supply, fill #1

## 2023-04-04 ENCOUNTER — Ambulatory Visit
Admission: EM | Admit: 2023-04-04 | Discharge: 2023-04-04 | Disposition: A | Discharge status: Home / Self Care | Payer: BC Managed Care – PPO | Attending: Nurse Practitioner | Admitting: Nurse Practitioner

## 2023-04-04 ENCOUNTER — Telehealth: Payer: Self-pay | Admitting: Internal Medicine

## 2023-04-04 ENCOUNTER — Ambulatory Visit: Payer: BC Managed Care – PPO

## 2023-04-04 DIAGNOSIS — S92344A Nondisplaced fracture of fourth metatarsal bone, right foot, initial encounter for closed fracture: Secondary | ICD-10-CM | POA: Diagnosis not present

## 2023-04-04 DIAGNOSIS — M25472 Effusion, left ankle: Secondary | ICD-10-CM | POA: Diagnosis not present

## 2023-04-04 DIAGNOSIS — M79671 Pain in right foot: Secondary | ICD-10-CM | POA: Diagnosis not present

## 2023-04-04 DIAGNOSIS — S93402A Sprain of unspecified ligament of left ankle, initial encounter: Secondary | ICD-10-CM | POA: Diagnosis not present

## 2023-04-04 DIAGNOSIS — M7732 Calcaneal spur, left foot: Secondary | ICD-10-CM | POA: Diagnosis not present

## 2023-04-04 DIAGNOSIS — S92334A Nondisplaced fracture of third metatarsal bone, right foot, initial encounter for closed fracture: Secondary | ICD-10-CM | POA: Diagnosis not present

## 2023-04-04 DIAGNOSIS — M25572 Pain in left ankle and joints of left foot: Secondary | ICD-10-CM | POA: Diagnosis not present

## 2023-04-04 DIAGNOSIS — W19XXXA Unspecified fall, initial encounter: Secondary | ICD-10-CM | POA: Diagnosis not present

## 2023-04-04 NOTE — ED Provider Notes (Signed)
[facility]    CSN: [id number] Arrival date & time: 04/04/23  1431      History   Chief Complaint Chief Complaint  Patient presents with   Foot Injury   Fall    HPI Denise Burns is a 52 y.o. female.   Patient presents today with 1 day history of right foot and left ankle pain.  Reports she was going down the stairs when she missed the last step and fell, twisting her right ankle and landing with her left foot bent backward.  Reports she has having pain with ambulation especially in the bottom of the right foot.  There is also swelling and bruising to the right foot and left ankle.  No numbness or tingling in the foot or ankle.  She has tried to elevate and has been taking ibuprofen for the pain with minimal improvement.  Reports she takes Norco daily already for chronic back pain.    Past Medical History:  Diagnosis Date   Addison disease (HCC)    Arthritis    Back pain    Complication of anesthesia    nausea   GERD (gastroesophageal reflux disease)    History of kidney stones     Patient Active Problem List   Diagnosis Date Noted   Lumbar facet joint pain 01/25/2023   H/O pyelonephritis 10/27/2022   Sepsis secondary to UTI (HCC) 09/16/2022   Hyponatremia 09/16/2022   Hydronephrosis of right kidney 09/16/2022   Bilateral nephrolithiasis 08/24/2022   Dyslipidemia, goal LDL below 100 07/03/2022   Vitamin D deficiency 07/03/2022   Adenomatous polyp of colon 06/08/2022   Elevated blood pressure reading 05/17/2022   Generalized anxiety disorder 05/17/2022   Impaired fasting glucose 05/17/2022   Abnormal weight gain 05/17/2022   BMI 32.0-32.9,adult 05/17/2022   Pre-diabetes 08/26/2021   Acute recurrent pansinusitis 05/09/2021   Cough 05/09/2021   Trigger point with back pain (PSIS area) (Right) 04/21/2021   Encounter for screening colonoscopy 01/31/2021   Abnormal uterine bleeding 01/27/2021   Multiple renal calculi 12/14/2020   Urinary tract  infection with hematuria 11/30/2020   Nausea 11/30/2020   Dysuria 11/30/2020   Postmenopausal bleeding 11/02/2020   Chronic use of opiate for therapeutic purpose 11/02/2020   Uncomplicated opioid dependence (HCC) 11/02/2020   Encounter to establish care 10/28/2020   Abnormal kidney function 10/28/2020   Iron deficiency anemia 10/28/2020   Encounter for screening mammogram for malignant neoplasm of breast 10/28/2020   Screening for colon cancer 10/28/2020   Kidney stones    Sepsis (HCC)    Pyelonephritis 10/06/2020   Kissing spine syndrome 09/08/2020   Chronic low back pain (Midline) w/o sciatica 09/08/2020   Allergic rhinitis 08/31/2020   Allergic rhinitis due to animal (cat) (dog) hair and dander 08/31/2020   Chronic allergic conjunctivitis 08/31/2020   Deviated nasal septum 08/31/2020   Shortness of breath 08/31/2020   Osteoarthritis of hips (Bilateral) 08/20/2020   Hair loss 08/14/2020   Chronic hip pain (Bilateral) 08/03/2020   Enthesopathy hip region (Left) 08/03/2020   Lumbar radiculitis (L1/L2) (Right) 05/11/2020   Chronic groin pain (Right) 05/11/2020   Osteoarthritis of hip (Right) 02/20/2020   Chronic hip pain (Right) 02/11/2020   Spondylosis without myelopathy or radiculopathy, lumbosacral region 01/16/2019   Lumbar spondylosis 10/17/2018   Abnormal MRI, lumbar spine (03/22/2018) 09/03/2018   Lumbar facet hypertrophy (Multilevel) (Bilateral) 09/03/2018   Lumbar facet syndrome (Bilateral) 09/03/2018   DDD (degenerative disc disease), lumbosacral 09/03/2018   Spondylolisthesis (8 mm)  at L5-S1 level 09/03/2018   Lumbar Grade 1 Retrolisthesis (2 mm) of L2/L3 09/03/2018   L5-S1 pars defect w/ spondylolisthesis (Bilateral) 09/03/2018   Lumbar pars defect (L5-S1) (Bilateral) 09/03/2018   Lumbar foraminal stenosis (Left: L2-3) (Bilateral: L5-S1) 09/03/2018   Osteoarthritis of sacroiliac joint (Bilateral) 09/03/2018   Chronic musculoskeletal pain 09/03/2018   Neurogenic  pain 09/03/2018   Osteoarthritis of facet joint of lumbar spine 09/03/2018   Greater trochanteric bursitis (Bilateral) 09/03/2018   Elevated sed rate 08/21/2018   Chronic low back pain (1ry area of Pain) (Bilateral) (R>L) w/o sciatica 08/16/2018   Chronic sacroiliac joint pain (2ry area of Pain) (Right) 08/16/2018   Chronic pain syndrome 08/16/2018   Long term current use of opiate analgesic 08/16/2018   Pharmacologic therapy 08/16/2018   Disorder of skeletal system 08/16/2018   Problems influencing health status 08/16/2018   Premature ovarian failure 08/06/2018   Addison's disease (HCC) 04/30/2018   Class 1 obesity due to excess calories without serious comorbidity with body mass index (BMI) of 31.0 to 31.9 in adult 04/30/2018   Chronic midline low back pain with bilateral sciatica 04/30/2018    Past Surgical History:  Procedure Laterality Date   CHOLECYSTECTOMY  2010   COLONOSCOPY WITH PROPOFOL N/A 06/08/2022   Procedure: COLONOSCOPY WITH PROPOFOL;  Surgeon: Wyline Mood, MD;  Location: [facility];  Service: Gastroenterology;  Laterality: N/A;   CYSTOSCOPY W/ RETROGRADES Right 08/09/2021   Procedure: CYSTOSCOPY WITH RETROGRADE PYELOGRAM;  Surgeon: Vanna Scotland, MD;  Location: [facility] ORS;  Service: Urology;  Laterality: Right;   CYSTOSCOPY W/ URETERAL STENT REMOVAL Right 08/09/2021   Procedure: CYSTOSCOPY WITH STENT REMOVAL;  Surgeon: Vanna Scotland, MD;  Location: [facility] ORS;  Service: Urology;  Laterality: Right;   CYSTOSCOPY/URETEROSCOPY/HOLMIUM LASER/STENT PLACEMENT Right 06/28/2021   Procedure: CYSTOSCOPY/URETEROSCOPY/STENT PLACEMENT;  Surgeon: Vanna Scotland, MD;  Location: [facility] ORS;  Service: Urology;  Laterality: Right;   CYSTOSCOPY/URETEROSCOPY/HOLMIUM LASER/STENT PLACEMENT Right 08/09/2021   Procedure: CYSTOSCOPY/URETEROSCOPY/HOLMIUM LASER/STENT PLACEMENT;  Surgeon: Vanna Scotland, MD;  Location: [facility] ORS;  Service: Urology;  Laterality: Right;   dental work     DIAGNOSTIC  LAPAROSCOPY  2004   endometriosis dx   DILATION AND CURETTAGE OF UTERUS  2010   IR NEPHROSTOMY PLACEMENT LEFT  12/14/2020   NEPHROLITHOTOMY Left 12/14/2020   Procedure: NEPHROLITHOTOMY PERCUTANEOUS;  Surgeon: Vanna Scotland, MD;  Location: [facility] ORS;  Service: Urology;  Laterality: Left;   POLYPECTOMY     WISDOM TOOTH EXTRACTION      OB History   No obstetric history on file.      Home Medications    Prior to Admission medications   Medication Sig Start Date End Date Taking? Authorizing Provider  Azelastine HCl 137 MCG/SPRAY SOLN Place 1 spray into each nostril 2 (two) times daily. 01/30/23   Melida Quitter, PA  cetirizine (ZYRTEC) 10 MG tablet Take 10 mg by mouth daily.    [provider]  ergocalciferol (DRISDOL) 1.25 MG (50000 UT) capsule Take 1 capsule (50,000 Units total) by mouth once a week. 06/22/22   Carlean Jews, NP  escitalopram (LEXAPRO) 5 MG tablet Take 1 tablet (5 mg total) by mouth daily. 03/29/23   Sandre Kitty, MD  fludrocortisone (FLORINEF) 0.1 MG tablet Take 0.5 tablets (0.05 mg total) by mouth daily. 08/30/22   Shamleffer, Konrad Dolores, MD  fluticasone (FLONASE SENSIMIST) 27.5 MCG/SPRAY nasal spray Place 1 spray into the nose in the morning and at bedtime.    [provider]  HYDROcodone-acetaminophen [facility])  7.5-325 MG tablet Take 1 tablet by mouth every 8 (eight) hours as needed for severe pain. Must last 30 days. 03/29/23 04/28/23  Delano Metz, MD  HYDROcodone-acetaminophen (NORCO) 7.5-325 MG tablet Take 1 tablet by mouth every 8 (eight) hours as needed for severe pain. Must last 30 days. 04/28/23 05/28/23  Delano Metz, MD  HYDROcodone-acetaminophen (NORCO) 7.5-325 MG tablet Take 1 tablet by mouth every 8 (eight) hours as needed for severe pain. Must last 30 days. 05/28/23 06/27/23  Delano Metz, MD  hydrocortisone (CORTEF) 10 MG tablet Take 1.5 tablets (15 mg total) by mouth with breakfast and 1/2 tablet in the  afternoon. (May double up during sick days) 08/30/22   Shamleffer, Konrad Dolores, MD  lansoprazole (PREVACID) 30 MG capsule Take 1 capsule (30 mg total) by mouth daily at 12 noon. 10/31/22   Carlean Jews, NP  meloxicam (MOBIC) 15 MG tablet Take 1 tablet (15 mg total) by mouth daily. 10/27/22   Carlean Jews, NP  Methen-Hyosc-Meth Blue-Na Phos (UROGESIC-BLUE) 81.6 MG TABS Take 1 tablet (81.6 mg total) by mouth 4 (four) times daily as needed. 07/15/21   Vanna Scotland, MD  naloxone [facility]) nasal spray 4 mg/0.1 mL Place 1 spray into the nose as needed for opioid-induced respiratory depresssion. In case of emergency (overdose), spray once into each nostril. If no response within 3 minutes, repeat application and call 911. 03/22/23 03/21/24  Delano Metz, MD  omega-3 acid ethyl esters (LOVAZA) 1 g capsule Take 1 g by mouth daily.    [provider]  oxyCODONE-acetaminophen (PERCOCET) 5-325 MG tablet Take 1 tablet by mouth every 8 (eight) hours as needed for up to 7 days for severe pain. Must last 7 days. 03/09/23 03/16/23  Delano Metz, MD  oxyCODONE-acetaminophen (PERCOCET) 5-325 MG tablet Take 1 tablet by mouth every 8 (eight) hours as needed for up to 7 days for severe pain. Must last 7 days. 03/16/23 03/23/23  Delano Metz, MD  pseudoephedrine (SUDAFED) 30 MG tablet Take 30 mg by mouth every 12 (twelve) hours as needed for congestion.     [provider]    Family History Family History  Problem Relation Age of Onset   Arthritis Mother    Hyperlipidemia Mother    Hypertension Mother    High blood pressure Mother    Lung cancer Mother    Liver cancer Mother    Brain cancer Mother    Alcohol abuse Father    Hearing loss Father    Hyperlipidemia Father    High blood pressure Father    Colon polyps Maternal Grandmother 44   Colon cancer Neg Hx    Esophageal cancer Neg Hx    Stomach cancer Neg Hx    Rectal cancer Neg Hx     Social History Social  History   Tobacco Use   Smoking status: Former    Current packs/day: 0.00    Types: Cigarettes    Quit date: 08/01/1994    Years since quitting: 28.6   Smokeless tobacco: Never  Vaping Use   Vaping status: Never Used  Substance Use Topics   Alcohol use: Not Currently    Comment: once per 6 months   Drug use: Never     Allergies   Sulfacetamide sodium (acne), Tramadol, Lisinopril, and Sulfa antibiotics   Review of Systems Review of Systems Per HPI  Physical Exam Triage Vital Signs ED Triage Vitals  Encounter Vitals Group     BP 04/04/23 1523 136/83  Systolic BP Percentile --      Diastolic BP Percentile --      Pulse Rate 04/04/23 1523 90     Resp 04/04/23 1523 18     Temp 04/04/23 1523 99.2 F (37.3 C)     Temp Source 04/04/23 1523 Oral     SpO2 04/04/23 1523 96 %     Weight --      Height --      Head Circumference --      Peak Flow --      Pain Score 04/04/23 1524 4     Pain Loc --      Pain Education --      Exclude from Growth Chart --    No data found.  Updated Vital Signs BP 136/83 (BP Location: Left Arm)   Pulse 90   Temp 99.2 F (37.3 C) (Oral)   Resp 18   LMP 12/01/2020 (Approximate)   SpO2 96%   Visual Acuity Right Eye Distance:   Left Eye Distance:   Bilateral Distance:    Right Eye Near:   Left Eye Near:    Bilateral Near:     Physical Exam Vitals and nursing note reviewed.  Constitutional:      General: She is not in acute distress.    Appearance: Normal appearance. She is not toxic-appearing.  HENT:     Mouth/Throat:     Mouth: Mucous membranes are moist.     Pharynx: Oropharynx is clear.  Pulmonary:     Effort: Pulmonary effort is normal. No respiratory distress.  Musculoskeletal:     Comments: Inspection: swelling and bruising noted to the base of the metatarsals approximately 3 through 5 that extends to the proximal phalanx of each toe.  No obvious deformity or redness.  There is swelling and bruising to the left ankle  inferior to the lateral malleolus and medially at the distal tibia. Palpation: tender to palpation to both the right metatarsals proximally and lateral left malleolus, right medial distal tibia: No obvious deformities palpated ROM: patient is able to flex and extend right foot and is able to rotate; difficult to assess in left ankle secondary to pain Strength: 5/5 bilateral lower extremities Neurovascular: neurovascularly intact in bilateral lower extremities  Skin:    General: Skin is warm and dry.     Capillary Refill: Capillary refill takes less than 2 seconds.     Coloration: Skin is not jaundiced or pale.     Findings: No erythema.  Neurological:     Mental Status: She is alert and oriented to person, place, and time.  Psychiatric:        Behavior: Behavior is cooperative.      UC Treatments / Results  Labs (all labs ordered are listed, but only abnormal results are displayed) Labs Reviewed - No data to display  EKG   Radiology No results found.  Procedures Procedures (including critical care time)  Medications Ordered in UC Medications - No data to display  Initial Impression / Assessment and Plan / UC Course  I have reviewed the triage vital signs and the nursing notes.  Pertinent labs & imaging results that were available during my care of the patient were reviewed by me and considered in my medical decision making (see chart for details).   Patient is well-appearing, normotensive, afebrile, not tachycardic, not tachypneic, oxygenating well on room air.    1. Fall, initial encounter   2. Closed nondisplaced fracture of third metatarsal bone  of right foot, initial encounter 3. Closed nondisplaced fracture of fourth metatarsal bone of right foot, initial encounter Foot xray reviewed by myself and Dr. Willaim Rayas to have fractures of distal 3rd and 4th metacarpals Patient has exquisite tenderness to palpation of the 3rd and 4th distal metacarpal bones  Post  op shoe given Recommended rest, ice, elevation, ibuprofen for pain Follow up with Podiatry if symptoms do not improve with this treatment  4. Sprain of left ankle, unspecified ligament, initial encounter Xray of left ankle reviewed by myself and Dr. Tracie Harrier No obvious fractures were seen ACE wrap was applied and recommended rest, ice, elevation, ace wrap when weight bearing   The patient was given the opportunity to ask questions.  All questions answered to their satisfaction.  The patient is in agreement to this plan.    Final Clinical Impressions(s) / UC Diagnoses   Final diagnoses:  Fall, initial encounter  Closed nondisplaced fracture of third metatarsal bone of right foot, initial encounter  Closed nondisplaced fracture of fourth metatarsal bone of right foot, initial encounter  Sprain of left ankle, unspecified ligament, initial encounter     Discharge Instructions      The x-rays were not formally read by radiologist today, however I reviewed them along with a physician in urgent care.  It looks like the bones in your right foot are fractured over the third and fourth toes.  Please wear the postop shoe, continue to rest, ice your foot, elevate your foot, and you can take ibuprofen for pain.  If the pain does not improve significantly over 1 to 2 weeks, follow-up with podiatry.  The left ankle bones looked okay without any fractures.  Please wear the Ace wrap whenever you are bearing weight.  You can also keep your ankle elevated, apply ice 15 minutes on, 45 minutes off, and take ibuprofen for the pain.  You can also follow-up with a podiatrist if the pain does not improve.     ED Prescriptions   None    PDMP not reviewed this encounter.   Valentino Nose, NP 04/04/23 [phone]

## 2023-04-04 NOTE — ED Triage Notes (Signed)
Pt reports she twisted left ankle and bend the toes backward in right foot yesterday when she fell using the stairs.

## 2023-04-04 NOTE — Discharge Instructions (Addendum)
The x-rays were not formally read by radiologist today, however I reviewed them along with a physician in urgent care.  It looks like the bones in your right foot are fractured over the third and fourth toes.  Please wear the postop shoe, continue to rest, ice your foot, elevate your foot, and you can take ibuprofen for pain.  If the pain does not improve significantly over 1 to 2 weeks, follow-up with podiatry.  The left ankle bones looked okay without any fractures.  Please wear the Ace wrap whenever you are bearing weight.  You can also keep your ankle elevated, apply ice 15 minutes on, 45 minutes off, and take ibuprofen for the pain.  You can also follow-up with a podiatrist if the pain does not improve.

## 2023-04-04 NOTE — Telephone Encounter (Signed)
Attempted to call patient to discuss x-ray results with notable fractures on foot x-ray.  Patient did not answer.  Left voicemail per privacy policy for call back.  Discussed x-ray results with provider who evaluated patient, Cathlean Marseilles, NP.  She advised no current changes to her treatment plan.  Patient was already splinted prior to her discharge.

## 2023-04-10 ENCOUNTER — Encounter: Payer: Self-pay | Admitting: Podiatry

## 2023-04-10 ENCOUNTER — Ambulatory Visit: Payer: BC Managed Care – PPO | Admitting: Podiatry

## 2023-04-10 DIAGNOSIS — S92309A Fracture of unspecified metatarsal bone(s), unspecified foot, initial encounter for closed fracture: Secondary | ICD-10-CM | POA: Diagnosis not present

## 2023-04-10 NOTE — Progress Notes (Signed)
Subjective:  Patient ID: Denise Burns, female    DOB: 11-Feb-1971,  MRN: [id number] HPI Chief Complaint  Patient presents with   Foot Injury    Forefoot right - missed a step and bent the toes back on 04/03/23, went to Urgent Care next day, xrayed-said had fracture the met head, also ankle sprain of the left, wearing a surgical shoe on right foot and ankle brace on the left   New Patient (Initial Visit)    52 y.o. female presents with the above complaint.   ROS: Denies fever chills nausea by muscle aches pains calf pain back pain chest pain shortness of breath. She was placed in a Darco shoe and told that she was able to ambulate. Past Medical History:  Diagnosis Date   Addison disease (HCC)    Arthritis    Back pain    Complication of anesthesia    nausea   GERD (gastroesophageal reflux disease)    History of kidney stones    Past Surgical History:  Procedure Laterality Date   CHOLECYSTECTOMY  2010   COLONOSCOPY WITH PROPOFOL N/A 06/08/2022   Procedure: COLONOSCOPY WITH PROPOFOL;  Surgeon: Wyline Mood, MD;  Location: [facility];  Service: Gastroenterology;  Laterality: N/A;   CYSTOSCOPY W/ RETROGRADES Right 08/09/2021   Procedure: CYSTOSCOPY WITH RETROGRADE PYELOGRAM;  Surgeon: Vanna Scotland, MD;  Location: [facility] ORS;  Service: Urology;  Laterality: Right;   CYSTOSCOPY W/ URETERAL STENT REMOVAL Right 08/09/2021   Procedure: CYSTOSCOPY WITH STENT REMOVAL;  Surgeon: Vanna Scotland, MD;  Location: [facility] ORS;  Service: Urology;  Laterality: Right;   CYSTOSCOPY/URETEROSCOPY/HOLMIUM LASER/STENT PLACEMENT Right 06/28/2021   Procedure: CYSTOSCOPY/URETEROSCOPY/STENT PLACEMENT;  Surgeon: Vanna Scotland, MD;  Location: [facility] ORS;  Service: Urology;  Laterality: Right;   CYSTOSCOPY/URETEROSCOPY/HOLMIUM LASER/STENT PLACEMENT Right 08/09/2021   Procedure: CYSTOSCOPY/URETEROSCOPY/HOLMIUM LASER/STENT PLACEMENT;  Surgeon: Vanna Scotland, MD;  Location: [facility] ORS;  Service: Urology;  Laterality:  Right;   dental work     DIAGNOSTIC LAPAROSCOPY  2004   endometriosis dx   DILATION AND CURETTAGE OF UTERUS  2010   IR NEPHROSTOMY PLACEMENT LEFT  12/14/2020   NEPHROLITHOTOMY Left 12/14/2020   Procedure: NEPHROLITHOTOMY PERCUTANEOUS;  Surgeon: Vanna Scotland, MD;  Location: [facility] ORS;  Service: Urology;  Laterality: Left;   POLYPECTOMY     WISDOM TOOTH EXTRACTION      Current Outpatient Medications:    Azelastine HCl 137 MCG/SPRAY SOLN, Place 1 spray into each nostril 2 (two) times daily., Disp: 30 mL, Rfl: 1   cetirizine (ZYRTEC) 10 MG tablet, Take 10 mg by mouth daily., Disp: , Rfl:    ergocalciferol (DRISDOL) 1.25 MG (50000 UT) capsule, Take 1 capsule (50,000 Units total) by mouth once a week., Disp: 4 capsule, Rfl: 5   escitalopram (LEXAPRO) 5 MG tablet, Take 1 tablet (5 mg total) by mouth daily., Disp: 90 tablet, Rfl: 1   fludrocortisone (FLORINEF) 0.1 MG tablet, Take 0.5 tablets (0.05 mg total) by mouth daily., Disp: 45 tablet, Rfl: 3   fluticasone (FLONASE SENSIMIST) 27.5 MCG/SPRAY nasal spray, Place 1 spray into the nose in the morning and at bedtime., Disp: , Rfl:    HYDROcodone-acetaminophen (NORCO) 7.5-325 MG tablet, Take 1 tablet by mouth every 8 (eight) hours as needed for severe pain. Must last 30 days., Disp: 90 tablet, Rfl: 0   [START ON 04/28/2023] HYDROcodone-acetaminophen (NORCO) 7.5-325 MG tablet, Take 1 tablet by mouth every 8 (eight) hours as needed for severe pain. Must last 30 days., Disp: 90 tablet, Rfl: 0   [  START ON 05/28/2023] HYDROcodone-acetaminophen (NORCO) 7.5-325 MG tablet, Take 1 tablet by mouth every 8 (eight) hours as needed for severe pain. Must last 30 days., Disp: 90 tablet, Rfl: 0   hydrocortisone (CORTEF) 10 MG tablet, Take 1.5 tablets (15 mg total) by mouth with breakfast and 1/2 tablet in the afternoon. (May double up during sick days), Disp: 200 tablet, Rfl: 3   lansoprazole (PREVACID) 30 MG capsule, Take 1 capsule (30 mg total) by mouth daily at 12  noon., Disp: 90 capsule, Rfl: 1   meloxicam (MOBIC) 15 MG tablet, Take 1 tablet (15 mg total) by mouth daily., Disp: 90 tablet, Rfl: 1   Methen-Hyosc-Meth Blue-Na Phos (UROGESIC-BLUE) 81.6 MG TABS, Take 1 tablet (81.6 mg total) by mouth 4 (four) times daily as needed., Disp: 120 tablet, Rfl: 0   naloxone (NARCAN) nasal spray 4 mg/0.1 mL, Place 1 spray into the nose as needed for opioid-induced respiratory depresssion. In case of emergency (overdose), spray once into each nostril. If no response within 3 minutes, repeat application and call 911., Disp: 2 each, Rfl: 0   omega-3 acid ethyl esters (LOVAZA) 1 g capsule, Take 1 g by mouth daily., Disp: , Rfl:    oxyCODONE-acetaminophen (PERCOCET) 5-325 MG tablet, Take 1 tablet by mouth every 8 (eight) hours as needed for up to 7 days for severe pain. Must last 7 days., Disp: 21 tablet, Rfl: 0   oxyCODONE-acetaminophen (PERCOCET) 5-325 MG tablet, Take 1 tablet by mouth every 8 (eight) hours as needed for up to 7 days for severe pain. Must last 7 days., Disp: 21 tablet, Rfl: 0   pseudoephedrine (SUDAFED) 30 MG tablet, Take 30 mg by mouth every 12 (twelve) hours as needed for congestion. , Disp: , Rfl:   Allergies  Allergen Reactions   Sulfacetamide Sodium (Acne)     Other reaction(s): sores   Tramadol Other (See Comments)    Other reaction(s): muscle tension   Lisinopril Rash and Cough   Sulfa Antibiotics Other (See Comments)    Blisters in mouth    Review of Systems Objective:  There were no vitals filed for this visit.  General: Well developed, nourished, in no acute distress, alert and oriented x3   Dermatological: Skin is warm, dry and supple bilateral. Nails x 10 are well maintained; remaining integument appears unremarkable at this time. There are no open sores, no preulcerative lesions, no rash or signs of infection present.  Vascular: Dorsalis Pedis artery and Posterior Tibial artery pedal pulses are 2/4 bilateral with immedate capillary  fill time. Pedal hair growth present. No varicosities and no lower extremity edema present bilateral.   Neruologic: Grossly intact via light touch bilateral. Vibratory intact via tuning fork bilateral. Protective threshold with Semmes Wienstein monofilament intact to all pedal sites bilateral. Patellar and Achilles deep tendon reflexes 2+ bilateral. No Babinski or clonus noted bilateral.   Musculoskeletal: No gross boney pedal deformities bilateral. No pain, crepitus, or limitation noted with foot and ankle range of motion bilateral. Muscular strength 5/5 in all groups tested bilateral.  Ecchymosis edema mild erythema along the forefoot and lateral border of the right foot.  She has tenderness on palpation of the left ankle.    Gait: Unassisted, Nonantalgic.    Radiographs:  Radiographs were reviewed and demonstrated fractures to the second third and fourth metatarsals at the neck right foot.  Assessment & Plan:   Assessment: Fracture metatarsals right foot  Plan: Placed her in a cam boot follow-up with her in 1  month x-rays right forefoot left ankle     Samuel Rittenhouse T. [place], [place]

## 2023-04-17 ENCOUNTER — Ambulatory Visit
Admission: RE | Admit: 2023-04-17 | Discharge: 2023-04-17 | Disposition: A | Discharge status: Home / Self Care | Payer: BC Managed Care – PPO | Source: Ambulatory Visit | Attending: Family Medicine

## 2023-04-17 ENCOUNTER — Other Ambulatory Visit: Payer: Self-pay

## 2023-04-17 ENCOUNTER — Other Ambulatory Visit: Payer: Self-pay | Admitting: Nurse Practitioner

## 2023-04-17 DIAGNOSIS — Z1231 Encounter for screening mammogram for malignant neoplasm of breast: Secondary | ICD-10-CM

## 2023-04-20 ENCOUNTER — Encounter: Payer: Self-pay | Admitting: Pain Medicine

## 2023-04-20 ENCOUNTER — Ambulatory Visit: Payer: BC Managed Care – PPO | Attending: Pain Medicine | Admitting: Pain Medicine

## 2023-04-20 VITALS — BP 139/95 | HR 99 | Temp 97.3°F | Resp 18 | Ht 64.0 in | Wt 175.0 lb

## 2023-04-20 DIAGNOSIS — Z09 Encounter for follow-up examination after completed treatment for conditions other than malignant neoplasm: Secondary | ICD-10-CM | POA: Diagnosis not present

## 2023-04-20 DIAGNOSIS — M25551 Pain in right hip: Secondary | ICD-10-CM | POA: Diagnosis not present

## 2023-04-20 DIAGNOSIS — G8929 Other chronic pain: Secondary | ICD-10-CM | POA: Insufficient documentation

## 2023-04-20 DIAGNOSIS — M25552 Pain in left hip: Secondary | ICD-10-CM | POA: Diagnosis not present

## 2023-04-20 DIAGNOSIS — M5459 Other low back pain: Secondary | ICD-10-CM | POA: Insufficient documentation

## 2023-04-20 DIAGNOSIS — S92919A Unspecified fracture of unspecified toe(s), initial encounter for closed fracture: Secondary | ICD-10-CM | POA: Insufficient documentation

## 2023-04-20 DIAGNOSIS — M47816 Spondylosis without myelopathy or radiculopathy, lumbar region: Secondary | ICD-10-CM | POA: Insufficient documentation

## 2023-04-20 DIAGNOSIS — M545 Low back pain, unspecified: Secondary | ICD-10-CM | POA: Insufficient documentation

## 2023-04-20 DIAGNOSIS — S92531A Displaced fracture of distal phalanx of right lesser toe(s), initial encounter for closed fracture: Secondary | ICD-10-CM | POA: Diagnosis not present

## 2023-04-20 DIAGNOSIS — M533 Sacrococcygeal disorders, not elsewhere classified: Secondary | ICD-10-CM | POA: Insufficient documentation

## 2023-04-20 MED ORDER — PREDNISONE 20 MG PO TABS
ORAL_TABLET | ORAL | 0 refills | Status: AC
Start: 2023-04-20 — End: 2023-04-29

## 2023-04-20 NOTE — Progress Notes (Signed)
PROVIDER NOTE: Information contained herein reflects review and annotations entered in association with encounter. Interpretation of such information and data should be left to medically-trained personnel. Information provided to patient can be located elsewhere in the medical record under "Patient Instructions". Document created using STT-dictation technology, any transcriptional errors that may result from process are unintentional.    Patient: Denise Burns  Service Category: E/M  Provider: Oswaldo Done, MD  DOB: Dec 17, 1970  DOS: 04/20/2023  Referring Provider: Carlean Jews, NP  MRN: [id number]  Specialty: Interventional Pain Management  PCP: No primary care provider on file.  Type: Established Patient  Setting: Ambulatory outpatient    Location: Office  Delivery: Face-to-face     HPI  Ms. Sahian Delafosse, a 52 y.o. year old female, is here today because of her Chronic bilateral low back pain without sciatica [M54.50, G89.29]. Ms. Mcgroarty's primary complain today is Back Pain  Pertinent problems: Ms. Lush has Chronic midline low back pain with bilateral sciatica; Chronic low back pain (1ry area of Pain) (Bilateral) (R>L) w/o sciatica; Chronic sacroiliac joint pain (2ry area of Pain) (Right); Chronic pain syndrome; Abnormal MRI, lumbar spine (03/22/2018); Lumbar facet hypertrophy (Multilevel) (Bilateral); Lumbar facet syndrome (Bilateral); DDD (degenerative disc disease), lumbosacral; Spondylolisthesis (8 mm) at L5-S1 level; Lumbar Grade 1 Retrolisthesis (2 mm) of L2/L3; L5-S1 pars defect w/ spondylolisthesis (Bilateral); Lumbar pars defect (L5-S1) (Bilateral); Lumbar foraminal stenosis (Left: L2-3) (Bilateral: L5-S1); Osteoarthritis of sacroiliac joint (Bilateral); Chronic musculoskeletal pain; Neurogenic pain; Osteoarthritis of facet joint of lumbar spine; Greater trochanteric bursitis (Bilateral); Lumbar spondylosis; Spondylosis without myelopathy or radiculopathy, lumbosacral  region; Chronic hip pain (Right); Osteoarthritis of hip (Right); Lumbar radiculitis (L1/L2) (Right); Chronic groin pain (Right); Chronic hip pain (Bilateral); Enthesopathy hip region (Left); Osteoarthritis of hips (Bilateral); Kissing spine syndrome; Chronic low back pain (Midline) w/o sciatica; Trigger point with back pain (PSIS area) (Right); Lumbar facet joint pain; Broken toes (Right); and Closed displaced fracture of distal phalanx of lesser toe of right foot on their pertinent problem list. Pain Assessment: Severity of Chronic pain is reported as a 3 /10. Location: Back  /denies. Onset: More than a month ago. Quality:  . Timing: Constant. Modifying factor(s):  Marland Kitchen Vitals:  height is 5\' 4"  (1.626 m) and weight is 175 lb (79.4 kg). Her temperature is 97.3 F (36.3 C) (abnormal). Her blood pressure is 139/95 (abnormal) and her pulse is 99. Her respiration is 18 and oxygen saturation is 99%.  BMI: Estimated body mass index is 30.04 kg/m as calculated from the following:   Height as of this encounter: 5\' 4"  (1.626 m).   Weight as of this encounter: 175 lb (79.4 kg). Last encounter: 03/22/2023. Last procedure: 03/09/2023.  Reason for encounter: post-procedure evaluation and assessment.  The patient returns today for evaluation of bilateral lumbar facet medial branch radiofrequency ablation with the left side having been done on 03/09/2023 and the right side on 02/21/2023.  The patient indicates having attained 100% relief of the pain on the treated side immediately after the procedure.  Some of the pain started coming back and currently she has an ongoing 75% relief that she describes would have been better except for the fact that she fell and broke several toes on her right foot and twisted her left ankle.  Currently her gait is off and this is affecting her lower back.  She apparently has some displaced fractures of her toes on the right foot but they are observing those since they refer that  currently it is  not indicated to do surgery unless it worsens.  She describes that the pain is getting better and today I will be giving her a steroid taper to help with some of her right foot, left ankle, and lower back discomfort from this latest event.  Post-procedure evaluation   Procedure: Lumbar Facet, Medial Branch Radiofrequency Ablation (RFA) #3  Laterality: Left (-LT)  Level: L2, L3, L4, L5, and S1 Medial Branch Level(s). These levels will denervate the L3-4, L4-5, and L5-S1 lumbar facet joints.  Imaging: Fluoroscopy-guided         Anesthesia: Local anesthesia (1-2% Lidocaine) Anxiolysis: IV Versed 3.5 mg Sedation: Moderate Sedation Fentanyl 1 mL (50 mcg) DOS: 03/09/2023  Performed by: Oswaldo Done, MD  Purpose: Therapeutic/Palliative Indications: Low back pain severe enough to impact quality of life or function.  Pain Score: Pre-procedure: 3 /10 Post-procedure: 0-No pain/10      Effectiveness:  Initial hour after procedure: 100 %. Subsequent 4-6 hours post-procedure: 100 %. Analgesia past initial 6 hours: 70 % (ongoing). Ongoing improvement:  Analgesic: The patient describes having attained 100% relief of the pain for the duration of the local anesthetic which then was doing really good until her recent fall and now some of that pain has began to come back but she still getting at least a 75% improvement of her bilateral low back pain. Function: Ms. Viscusi reports improvement in function ROM: Ms. Stuewe reports improvement in ROM  Pharmacotherapy Assessment  Analgesic: Hydrocodone/APAP 7.5/325 1 tablet p.o. 3 times daily (22.5 mg/day of hydrocodone)  MME/day: 22.5 mg/day.   Monitoring: [place] PMP: PDMP reviewed during this encounter.       Pharmacotherapy: No side-effects or adverse reactions reported. Compliance: No problems identified. Effectiveness: Clinically acceptable.  Valerie Salts, RN  04/20/2023  1:05 PM  Sign when Signing Visit Safety precautions to be maintained  throughout the outpatient stay will include: orient to surroundings, keep bed in low position, maintain call bell within reach at all times, provide assistance with transfer out of bed and ambulation.    No results found for: "CBDTHCR" No results found for: "D8THCCBX" No results found for: "D9THCCBX"  UDS:  Summary  Date Value Ref Range Status  03/22/2023 Note  Final    Comment:    ==================================================================== [employer] [facility] (MW) ==================================================================== Test                             Result       Flag       Units  Drug Present and Declared for Prescription Verification   Hydrocodone                    1894         EXPECTED   ng/mg creat   Hydromorphone                  329          EXPECTED   ng/mg creat   Dihydrocodeine                 292          EXPECTED   ng/mg creat   Norhydrocodone                 1758         EXPECTED   ng/mg creat    Sources of hydrocodone include scheduled prescription medications.  Hydromorphone, dihydrocodeine and norhydrocodone are expected    metabolites of hydrocodone. Hydromorphone and dihydrocodeine are    also available as scheduled prescription medications.    Oxymorphone                    335          EXPECTED   ng/mg creat   Noroxymorphone                 134          EXPECTED   ng/mg creat    Sources of oxymorphone are scheduled prescription medications;    oxymorphone is also a metabolite of oxycodone. Noroxymorphone is an    expected metabolite of oxymorphone.  Drug Absent but Declared for Prescription Verification   Oxycodone                      Not Detected UNEXPECTED ng/mg creat    Oxycodone is almost always present in patients taking this drug    consistently.  Absence of oxycodone could be due to lapse of time    since the last dose or unusual pharmacokinetics (rapid  metabolism).  ==================================================================== Test                      Result    Flag   Units      Ref Range   Creatinine              65               mg/dL      >=38 ==================================================================== Declared Medications:  The flagging and interpretation on this report are based on the  following declared medications.  Unexpected results may arise from  inaccuracies in the declared medications.   **Note: The testing scope of this panel includes these medications:   Hydrocodone (Norco)  Oxycodone (Percocet)   **Note: The testing scope of this panel does not include the  following reported medications:   Acetaminophen (Norco)  Acetaminophen (Percocet)  Azelastine  Cetirizine (Zyrtec)  Escitalopram (Lexapro)  Fludrocortisone (Florinef)  Fluticasone (Flonase)  Hydrocortisone (Cortef)  Hyoscyamine  Lansoprazole (Prevacid)  Meloxicam (Mobic)  Methenamine  Methylene Blue  Naloxone (Narcan)  Omega-3 Fatty Acids  Pseudoephedrine (Sudafed)  Supplement  Vitamin D2 ==================================================================== For clinical consultation, please call [phone]. ====================================================================       ROS  Constitutional: Denies any fever or chills Gastrointestinal: No reported hemesis, hematochezia, vomiting, or acute GI distress Musculoskeletal: Denies any acute onset joint swelling, redness, loss of ROM, or weakness Neurological: No reported episodes of acute onset apraxia, aphasia, dysarthria, agnosia, amnesia, paralysis, loss of coordination, or loss of consciousness  Medication Review  Azelastine HCl, HYDROcodone-acetaminophen, Urogesic-Blue, cetirizine, ergocalciferol, escitalopram, fludrocortisone, fluticasone, hydrocortisone, lansoprazole, meloxicam, naloxone, omega-3 acid ethyl esters, predniSONE, and pseudoephedrine  History Review   Allergy: Ms. Bornholdt is allergic to sulfacetamide sodium (acne), tramadol, lisinopril, and sulfa antibiotics. Drug: Ms. Gorra  reports no history of drug use. Alcohol:  reports that she does not currently use alcohol. Tobacco:  reports that she quit smoking about 28 years ago. Her smoking use included cigarettes. She has never used smokeless tobacco. Social: Ms. Bigalke  reports that she quit smoking about 28 years ago. Her smoking use included cigarettes. She has never used smokeless tobacco. She reports that she does not currently use alcohol. She reports that she does not use drugs. Medical:  has a past medical history  of Addison disease (HCC), Arthritis, Back pain, Complication of anesthesia, GERD (gastroesophageal reflux disease), and History of kidney stones. Surgical: Ms. Hanas  has a past surgical history that includes Cholecystectomy (2010); Polypectomy; dental work; Dilation and curettage of uterus (2010); IR NEPHROSTOMY PLACEMENT LEFT (12/14/2020); Nephrolithotomy (Left, 12/14/2020); Wisdom tooth extraction; Diagnostic laparoscopy (2004); Cystoscopy/ureteroscopy/holmium laser/stent placement (Right, 06/28/2021); Cystoscopy w/ ureteral stent removal (Right, 08/09/2021); Cystoscopy w/ retrogrades (Right, 08/09/2021); Cystoscopy/ureteroscopy/holmium laser/stent placement (Right, 08/09/2021); and Colonoscopy with propofol (N/A, 06/08/2022). Family: family history includes Alcohol abuse in her father; Arthritis in her mother; Brain cancer in her mother; Colon polyps (age of onset: 29) in her maternal grandmother; Hearing loss in her father; High blood pressure in her father and mother; Hyperlipidemia in her father and mother; Hypertension in her mother; Liver cancer in her mother; Lung cancer in her mother.  Laboratory Chemistry Profile   Renal Lab Results  Component Value Date   BUN 22 (H) 09/18/2022   CREATININE 0.72 09/18/2022   BCR 15 06/15/2022   GFR 84.36 08/26/2021   GFRAA 86  08/16/2018   GFRNONAA >60 09/18/2022    Hepatic Lab Results  Component Value Date   AST 36 09/16/2022   ALT 36 09/16/2022   ALBUMIN 4.1 09/16/2022   ALKPHOS 63 09/16/2022   LIPASE 38 09/16/2022    Electrolytes Lab Results  Component Value Date   NA 140 09/18/2022   K 4.6 09/18/2022   CL 108 09/18/2022   CALCIUM 8.7 (L) 09/18/2022   MG 2.2 09/18/2022   PHOS 4.3 09/18/2022    Bone Lab Results  Component Value Date   VD25OH 14.0 (L) 06/15/2022   25OHVITD1 67 08/16/2018   25OHVITD2 <1.0 08/16/2018   25OHVITD3 67 08/16/2018   TESTOSTERONE 1 (L) 08/13/2020    Inflammation (CRP: Acute Phase) (ESR: Chronic Phase) Lab Results  Component Value Date   CRP 2 08/16/2018   ESRSEDRATE 37 (H) 08/16/2018   LATICACIDVEN 1.9 09/16/2022         Note: Above Lab results reviewed.  Recent Imaging Review  MM 3D SCREENING MAMMOGRAM BILATERAL BREAST CLINICAL DATA:  Screening.  EXAM: DIGITAL SCREENING BILATERAL MAMMOGRAM WITH TOMOSYNTHESIS AND CAD  TECHNIQUE: Bilateral screening digital craniocaudal and mediolateral oblique mammograms were obtained. Bilateral screening digital breast tomosynthesis was performed. The images were evaluated with computer-aided detection.  COMPARISON:  Previous exam(s).  ACR Breast Density Category b: There are scattered areas of fibroglandular density.  FINDINGS: There are no findings suspicious for malignancy.  IMPRESSION: No mammographic evidence of malignancy. A result letter of this screening mammogram will be mailed directly to the patient.  RECOMMENDATION: Screening mammogram in one year. (Code:SM-B-01Y)  BI-RADS CATEGORY  1: Negative.  Electronically Signed   By: Elberta Fortis M.D.   On: 04/18/2023 08:51 Note: Reviewed        Physical Exam  General appearance: Well nourished, well developed, and well hydrated. In no apparent acute distress Mental status: Alert, oriented x 3 (person, place, & time)       Respiratory: No evidence  of acute respiratory distress Eyes: PERLA Vitals: BP (!) 139/95   Pulse 99   Temp (!) 97.3 F (36.3 C)   Resp 18   Ht 5\' 4"  (1.626 m)   Wt 175 lb (79.4 kg)   LMP 12/01/2020 (Approximate)   SpO2 99%   BMI 30.04 kg/m  BMI: Estimated body mass index is 30.04 kg/m as calculated from the following:   Height as of this encounter: 5\' 4"  (1.626 m).  Weight as of this encounter: 175 lb (79.4 kg). Ideal: Ideal body weight: 54.7 kg (120 lb 9.5 oz) Adjusted ideal body weight: 64.6 kg (142 lb 5.7 oz)  Assessment   Diagnosis Status  1. Chronic low back pain (1ry area of Pain) (Bilateral) (R>L) w/o sciatica   2. Chronic sacroiliac joint pain (2ry area of Pain) (Right)   3. Lumbar facet syndrome (Bilateral)   4. Chronic hip pain (Bilateral)   5. Lumbar facet joint pain   6. Postop check   7. Closed displaced fracture of distal phalanx of lesser toe of right foot, initial encounter    Controlled Controlled Controlled   Updated Problems: Problem  Broken toes (Right)  Closed Displaced Fracture of Distal Phalanx of Lesser Toe of Right Foot    Plan of Care  Problem-specific:  No problem-specific Assessment & Plan notes found for this encounter.  Ms. Shatarra Ark has a current medication list which includes the following long-term medication(s): azelastine hcl, escitalopram, flonase sensimist, hydrocodone-acetaminophen, [START ON 04/28/2023] hydrocodone-acetaminophen, [START ON 05/28/2023] hydrocodone-acetaminophen, lansoprazole, meloxicam, and omega-3 acid ethyl esters.  Pharmacotherapy (Medications Ordered): Meds ordered this encounter  Medications   predniSONE (DELTASONE) 20 MG tablet    Sig: Take 3 tablets (60 mg total) by mouth daily with breakfast for 3 days, THEN 2 tablets (40 mg total) daily with breakfast for 3 days, THEN 1 tablet (20 mg total) daily with breakfast for 3 days.    Dispense:  18 tablet    Refill:  0   Orders:  Orders Placed This Encounter  Procedures    Nursing Instructions:    Please complete this patient's postprocedure evaluation.    Scheduling Instructions:     Please complete this patient's postprocedure evaluation.   Follow-up plan:   Return for scheduled encounter.      Interventional Therapies  Risk  Complexity Considerations:   WNL   Planned  Pending:   Diagnostic/therapeutic bilateral lumbar facet MBB #4 + bilateral trochanteric bursa inj. #4    Under consideration:   Therapeutic bilateral lumbar facet medial branch RFA #3    Completed:   Therapeutic bilateral IA hip injection (posterolateral approach) R3L2 (09/06/2022) (100/100/90/90)  Therapeutic bilateral lumbar facet MBB x3 (09/08/2020) (4-0) (100/100/60/60)  Therapeutic right lumbar facet RFA x2 (09/09/2021) (4-2) (100/100/90/>75)  Therapeutic left lumbar facet RFA x2 (10/07/2021) (4-0) (100/100/90/>75)  Therapeutic right IA hip inj. x2 (08/20/2020) (5-1) (100/100/80/80)  Therapeutic left IA hip inj. x1 (08/20/2020) (5-1) (100/100/80/80)  Therapeutic bilateral trochanteric bursa inj. x3 (04/07/2022)  (100/100/90/90)    Therapeutic  Palliative (PRN) options:   Palliative lumbar facet MBB   Palliative lumbar facet RFA  Palliative trochanteric bursa inj.    Pharmacotherapy  Nonopioids transferred 05/11/2020: Baclofen and Mobic       Recent Visits Date Type Provider Dept  03/22/23 Office Visit Delano Metz, MD [facility]  03/09/23 Procedure visit Delano Metz, MD [facility]  02/21/23 Procedure visit Delano Metz, MD [facility]  01/25/23 Office Visit Delano Metz, MD [facility]  Showing recent visits within past 90 days and meeting all other requirements Today's Visits Date Type Provider Dept  04/20/23 Office Visit Delano Metz, MD [facility]  Showing today's visits and meeting all other requirements Future Appointments Date Type Provider Dept  06/19/23 Appointment  Delano Metz, MD [facility]  Showing future appointments within next 90 days and meeting all other requirements  I discussed the assessment and treatment plan with the patient.  The patient was provided an opportunity to ask questions and all were answered. The patient agreed with the plan and demonstrated an understanding of the instructions.  Patient advised to call back or seek an in-person evaluation if the symptoms or condition worsens.  Duration of encounter: 20 minutes.  Total time on encounter, as per AMA guidelines included both the face-to-face and non-face-to-face time personally spent by the physician and/or other qualified health care professional(s) on the day of the encounter (includes time in activities that require the physician or other qualified health care professional and does not include time in activities normally performed by clinical staff). Physician's time may include the following activities when performed: Preparing to see the patient (e.g., pre-charting review of records, searching for previously ordered imaging, lab work, and nerve conduction tests) Review of prior analgesic pharmacotherapies. Reviewing PMP Interpreting ordered tests (e.g., lab work, imaging, nerve conduction tests) Performing post-procedure evaluations, including interpretation of diagnostic procedures Obtaining and/or reviewing separately obtained history Performing a medically appropriate examination and/or evaluation Counseling and educating the patient/family/caregiver Ordering medications, tests, or procedures Referring and communicating with other health care professionals (when not separately reported) Documenting clinical information in the electronic or other health record Independently interpreting results (not separately reported) and communicating results to the patient/ family/caregiver Care coordination (not separately reported)  Note by: Oswaldo Done,  MD Date: 04/20/2023; Time: 1:06 PM

## 2023-04-20 NOTE — Progress Notes (Signed)
Safety precautions to be maintained throughout the outpatient stay will include: orient to surroundings, keep bed in low position, maintain call bell within reach at all times, provide assistance with transfer out of bed and ambulation.  

## 2023-04-26 ENCOUNTER — Other Ambulatory Visit: Payer: Self-pay | Admitting: Nurse Practitioner

## 2023-04-26 DIAGNOSIS — K219 Gastro-esophageal reflux disease without esophagitis: Secondary | ICD-10-CM

## 2023-05-01 ENCOUNTER — Encounter: Payer: Self-pay | Admitting: Family Medicine

## 2023-05-01 ENCOUNTER — Ambulatory Visit: Payer: BC Managed Care – PPO | Admitting: Nurse Practitioner

## 2023-05-01 ENCOUNTER — Ambulatory Visit: Payer: BC Managed Care – PPO | Admitting: Family Medicine

## 2023-05-01 VITALS — BP 120/75 | HR 84 | Ht 64.0 in | Wt 185.4 lb

## 2023-05-01 DIAGNOSIS — F411 Generalized anxiety disorder: Secondary | ICD-10-CM | POA: Diagnosis not present

## 2023-05-01 DIAGNOSIS — Z23 Encounter for immunization: Secondary | ICD-10-CM | POA: Diagnosis not present

## 2023-05-01 NOTE — Patient Instructions (Addendum)
It was nice to see you today,  We addressed the following topics today: -We did not make any changes to your Lexapro dosing. - You are due for a Pap smear.  Please call your gynecologist at [facility] to schedule an appointment for this. - Follow-up with me in 6 months.    Have a great day,  Frederic Jericho, MD

## 2023-05-01 NOTE — Progress Notes (Signed)
Established Patient Office Visit  Subjective   Patient ID: Denise Burns, female    DOB: Dec 01, 1970  Age: 52 y.o. MRN: [id number]  Chief Complaint  Patient presents with   Medical Management of Chronic Issues    HPI Anxiety-patient has been taking Lexapro at current dose for approximately 1.5 years.  Takes it for general anxiety.  Stressors include work and her mom's illness which was diagnosed around time she started taking the medication.  Patient has no concerns or complaints with the medication.  Patient currently seeing a pain management specialist for chronic back pain.  Feels like this is going well but recently fell and broke her toes which led to worsening back pain.  The 10-year ASCVD risk score (Arnett DK, et al., 2019) is: 1.6%  Health Maintenance Due  Topic Date Due   Hepatitis C Screening  Never done   DTaP/Tdap/Td (1 - Tdap) Never done   Cervical Cancer Screening (HPV/Pap Cotest)  06/18/2021   Zoster Vaccines- Shingrix (2 of 2) 08/31/2022   INFLUENZA VACCINE  03/02/2023   COVID-19 Vaccine (4 - 2023-24 season) 04/02/2023      Objective:     BP 120/75   Pulse 84   Ht 5\' 4"  (1.626 m)   Wt 185 lb 6.4 oz (84.1 kg)   LMP 12/01/2020 (Approximate)   SpO2 96%   BMI 31.82 kg/m    Physical Exam General: Alert and oriented CV: Regular in rhythm Pulmonary: Lungs are bilaterally Psych: Pleasant affect, spontaneous speech.   No results found for any visits on 05/01/23.      Assessment & Plan:   Generalized anxiety disorder Assessment & Plan: Continue Lexapro at current dose, 10 mg daily.  Stable, well-controlled.  Follow-up 6 months.   Need for shingles vaccine -     Varicella-zoster vaccine IM     Return in about 6 months (around 10/29/2023) for Mood.    Sandre Kitty, MD

## 2023-05-01 NOTE — Assessment & Plan Note (Signed)
Continue Lexapro at current dose, 10 mg daily.  Stable, well-controlled.  Follow-up 6 months.

## 2023-05-08 ENCOUNTER — Ambulatory Visit: Payer: BC Managed Care – PPO | Admitting: Podiatry

## 2023-05-15 ENCOUNTER — Other Ambulatory Visit: Payer: Self-pay | Admitting: Nurse Practitioner

## 2023-05-15 ENCOUNTER — Other Ambulatory Visit: Payer: Self-pay

## 2023-05-15 DIAGNOSIS — G8929 Other chronic pain: Secondary | ICD-10-CM

## 2023-05-15 DIAGNOSIS — M47816 Spondylosis without myelopathy or radiculopathy, lumbar region: Secondary | ICD-10-CM

## 2023-05-15 MED ORDER — AZELASTINE HCL 137 MCG/SPRAY NA SOLN
1.0000 | Freq: Two times a day (BID) | NASAL | 1 refills | Status: AC
  Filled 2023-05-15: qty 30, 30d supply, fill #0
  Filled 2023-11-22: qty 30, 30d supply, fill #1

## 2023-05-15 MED ORDER — TIZANIDINE HCL 4 MG PO TABS
4.0000 mg | ORAL_TABLET | Freq: Three times a day (TID) | ORAL | 1 refills | Status: DC | PRN
  Filled 2023-05-15: qty 90, 30d supply, fill #0
  Filled 2023-08-26: qty 90, 30d supply, fill #1

## 2023-05-15 MED ORDER — MELOXICAM 15 MG PO TABS
15.0000 mg | ORAL_TABLET | Freq: Every day | ORAL | 1 refills | Status: DC
  Filled 2023-05-15: qty 90, 90d supply, fill #0
  Filled 2024-04-20: qty 90, 90d supply, fill #1

## 2023-05-23 ENCOUNTER — Ambulatory Visit (INDEPENDENT_AMBULATORY_CARE_PROVIDER_SITE_OTHER): Payer: BC Managed Care – PPO | Admitting: Podiatry

## 2023-05-23 ENCOUNTER — Ambulatory Visit (INDEPENDENT_AMBULATORY_CARE_PROVIDER_SITE_OTHER): Payer: BC Managed Care – PPO

## 2023-05-23 ENCOUNTER — Encounter: Payer: Self-pay | Admitting: Podiatry

## 2023-05-23 VITALS — BP 138/92 | HR 91

## 2023-05-23 DIAGNOSIS — M79672 Pain in left foot: Secondary | ICD-10-CM

## 2023-05-23 DIAGNOSIS — M79671 Pain in right foot: Secondary | ICD-10-CM | POA: Diagnosis not present

## 2023-05-23 DIAGNOSIS — S92309A Fracture of unspecified metatarsal bone(s), unspecified foot, initial encounter for closed fracture: Secondary | ICD-10-CM

## 2023-05-23 DIAGNOSIS — M76822 Posterior tibial tendinitis, left leg: Secondary | ICD-10-CM | POA: Diagnosis not present

## 2023-05-23 NOTE — Progress Notes (Signed)
Subjective:  Patient ID: Denise Burns, female    DOB: [birth year],  MRN: [id number]  Chief Complaint  Patient presents with   Foot Pain    "The inner part of my ankle swells, hurts and burns by the end of the day."   Fracture    "My toes are a little sore but I'm walking and wearing regular shoes.  I can't bend them."    52 y.o. female presents with the above complaint.  Patient presents with new complaint of left medial foot pain.  Patient states is painful to touch.  Patient is known to Dr. Al Corpus who treated her for metatarsal fracture which is doing good no further pain there.  She states the left side started to hurt hurts by the end of the day when she has been on her foot.  She has not seen anyone as prior to seeing me pain scale 7 out of 10 dull aching nature hurts with ambulation worse with pressure   Review of Systems: Negative except as noted in the HPI. Denies N/V/F/Ch.  Past Medical History:  Diagnosis Date   Addison disease (HCC)    Arthritis    Back pain    Complication of anesthesia    nausea   GERD (gastroesophageal reflux disease)    History of kidney stones     Current Outpatient Medications:    Azelastine HCl 137 MCG/SPRAY SOLN, Place 1 spray into each nostril 2 (two) times daily., Disp: 30 mL, Rfl: 1   cetirizine (ZYRTEC) 10 MG tablet, Take 10 mg by mouth daily., Disp: , Rfl:    escitalopram (LEXAPRO) 5 MG tablet, Take 1 tablet (5 mg total) by mouth daily., Disp: 90 tablet, Rfl: 1   fludrocortisone (FLORINEF) 0.1 MG tablet, Take 0.5 tablets (0.05 mg total) by mouth daily., Disp: 45 tablet, Rfl: 3   fluticasone (FLONASE SENSIMIST) 27.5 MCG/SPRAY nasal spray, Place 1 spray into the nose in the morning and at bedtime., Disp: , Rfl:    HYDROcodone-acetaminophen (NORCO) 7.5-325 MG tablet, Take 1 tablet by mouth every 8 (eight) hours as needed for severe pain. Must last 30 days., Disp: 90 tablet, Rfl: 0   HYDROcodone-acetaminophen (NORCO) 7.5-325 MG tablet, Take  1 tablet by mouth every 8 (eight) hours as needed for severe pain. Must last 30 days., Disp: 90 tablet, Rfl: 0   hydrocortisone (CORTEF) 10 MG tablet, Take 1.5 tablets (15 mg total) by mouth with breakfast and 1/2 tablet in the afternoon. (May double up during sick days), Disp: 200 tablet, Rfl: 3   lansoprazole (PREVACID) 30 MG capsule, Take 1 capsule (30 mg total) by mouth daily at 12 noon., Disp: 90 capsule, Rfl: 1   meloxicam (MOBIC) 15 MG tablet, Take 1 tablet (15 mg total) by mouth daily., Disp: 90 tablet, Rfl: 1   Methen-Hyosc-Meth Blue-Na Phos (UROGESIC-BLUE) 81.6 MG TABS, Take 1 tablet (81.6 mg total) by mouth 4 (four) times daily as needed., Disp: 120 tablet, Rfl: 0   naloxone (NARCAN) nasal spray 4 mg/0.1 mL, Place 1 spray into the nose as needed for opioid-induced respiratory depresssion. In case of emergency (overdose), spray once into each nostril. If no response within 3 minutes, repeat application and call 911., Disp: 2 each, Rfl: 0   omega-3 acid ethyl esters (LOVAZA) 1 g capsule, Take 1 g by mouth daily., Disp: , Rfl:    pseudoephedrine (SUDAFED) 30 MG tablet, Take 30 mg by mouth every 12 (twelve) hours as needed for congestion. , Disp: , Rfl:  tiZANidine (ZANAFLEX) 4 MG tablet, Take 1 tablet (4 mg total) by mouth every 8 (eight) hours as needed for muscle spasms., Disp: 90 tablet, Rfl: 1   HYDROcodone-acetaminophen (NORCO) 7.5-325 MG tablet, Take 1 tablet by mouth every 8 (eight) hours as needed for severe pain. Must last 30 days., Disp: 90 tablet, Rfl: 0  Social History   Tobacco Use  Smoking Status Former   Current packs/day: 0.00   Types: Cigarettes   Quit date: 08/01/1994   Years since quitting: 28.8  Smokeless Tobacco Never    Allergies  Allergen Reactions   Sulfacetamide Sodium (Acne)     Other reaction(s): sores   Tramadol Other (See Comments)    Other reaction(s): muscle tension   Lisinopril Rash and Cough   Sulfa Antibiotics Other (See Comments)    Blisters in  mouth    Objective:   Vitals:   05/23/23 1425  BP: (!) 138/92  Pulse: 91   There is no height or weight on file to calculate BMI. Constitutional Well developed. Well nourished.  Vascular Dorsalis pedis pulses palpable bilaterally. Posterior tibial pulses palpable bilaterally. Capillary refill normal to all digits.  No cyanosis or clubbing noted. Pedal hair growth normal.  Neurologic Normal speech. Oriented to person, place, and time. Epicritic sensation to light touch grossly present bilaterally.  Dermatologic Nails well groomed and normal in appearance. No open wounds. No skin lesions.  Orthopedic: Pain along the course of the posterior tibial tendon including the insertion pain with resisted inversion and plantarflexion of the foot no pain with dorsiflexion eversion of the foot.  No pain at the Achilles tendon ATFL ligament peroneal tendon   Radiographs: 3 views of skeletally show of the leftLeft ankle fracture ankle: No fracture noted.  No osteochondral lesion noted no bony abnormalities noted mild midfoot arthritis noted. Assessment:   1. Posterior tibial tendinitis, left    Plan:  Patient was evaluated and treated and all questions answered.  Left posterior tibial tendinitis -All questions and concerns were discussed with the patient in extensive detail given the amount of pain that she has she would benefit from cam boot immobilization she already has cam boot at home.  Advised her to place herself in it for next 4 weeks she states understanding if there is no improvement we will discuss steroid injection versus MRI during next visit she states understanding -I will also discuss orthotics management during next visit  No follow-ups on file.   Left posterior tibial tenditis Cam boot already ahs one   Right side healed

## 2023-05-28 ENCOUNTER — Other Ambulatory Visit: Payer: Self-pay

## 2023-06-05 ENCOUNTER — Encounter: Payer: BC Managed Care – PPO | Admitting: Family Medicine

## 2023-06-18 NOTE — Patient Instructions (Incomplete)

## 2023-06-18 NOTE — Progress Notes (Addendum)
PROVIDER NOTE: Information contained herein reflects review and annotations entered in association with encounter. Interpretation of such information and data should be left to medically-trained personnel. Information provided to patient can be located elsewhere in the medical record under "Patient Instructions". Document created using STT-dictation technology, any transcriptional errors that may result from process are unintentional.    Patient: Denise Burns  Service Category: E/M  Provider: Oswaldo Done, MD  DOB: 1970/09/03  DOS: 06/19/2023  Referring Provider: Carlean Jews, NP  MRN: [id number]  Specialty: Interventional Pain Management  PCP: Sandre Kitty, MD  Type: Established Patient  Setting: Ambulatory outpatient    Location: Office  Delivery: Face-to-face     HPI  Denise Burns, a 52 y.o. year old female, is here today because of her Chronic hip pain, bilateral [M25.551, M25.552, G89.29]. Denise Burns's primary complain today is Hip Pain (Bilateral )  Pertinent problems: Denise Burns has Chronic midline low back pain with bilateral sciatica; Chronic low back pain (1ry area of Pain) (Bilateral) (R>L) w/o sciatica; Chronic sacroiliac joint pain (2ry area of Pain) (Right); Chronic pain syndrome; Abnormal MRI, lumbar spine (03/22/2018); Lumbar facet hypertrophy (Multilevel) (Bilateral); Lumbar facet syndrome (Bilateral); DDD (degenerative disc disease), lumbosacral; Spondylolisthesis (8 mm) at L5-S1 level; Lumbar Grade 1 Retrolisthesis (2 mm) of L2/L3; L5-S1 pars defect w/ spondylolisthesis (Bilateral); Lumbar pars defect (L5-S1) (Bilateral); Lumbar foraminal stenosis (Left: L2-3) (Bilateral: L5-S1); Osteoarthritis of sacroiliac joint (Bilateral); Chronic musculoskeletal pain; Neurogenic pain; Osteoarthritis of facet joint of lumbar spine; Greater trochanteric bursitis (Bilateral); Lumbar spondylosis; Spondylosis without myelopathy or radiculopathy, lumbosacral region;  Chronic hip pain (Right); Osteoarthritis of hip (Right); Lumbar radiculitis (L1/L2) (Right); Chronic groin pain (Right); Chronic hip pain (Bilateral); Enthesopathy hip region (Left); Osteoarthritis of hips (Bilateral); Kissing spine syndrome; Chronic low back pain (Midline) w/o sciatica; Trigger point with back pain (PSIS area) (Right); Lumbar facet joint pain; Broken toes (Right); and Closed displaced fracture of distal phalanx of lesser toe of right foot on their pertinent problem list. Pain Assessment: Severity of Chronic pain is reported as a 5 /10. Location: Hip Right, Left/Radaites from hips to halfway to the thighs bilateral. Onset: More than a month ago. Quality: Constant, Aching, Shooting. Timing: Constant. Modifying factor(s): Pain medication, ice, heat, and resiting. Vitals:  height is 5\' 4"  (1.626 m) and weight is 173 lb (78.5 kg). Her temporal temperature is 97.9 F (36.6 C). Her blood pressure is 143/96 (abnormal) and her pulse is 104 (abnormal). Her respiration is 16 and oxygen saturation is 98%.  BMI: Estimated body mass index is 29.7 kg/m as calculated from the following:   Height as of this encounter: 5\' 4"  (1.626 m).   Weight as of this encounter: 173 lb (78.5 kg). Last encounter: 04/20/2023. Last procedure: 03/09/2023.  Reason for encounter: medication management.  The patient indicates doing well with the current medication regimen. No adverse reactions or side effects reported to the medications.   RTCB: 09/25/2023   Pharmacotherapy Assessment  Analgesic: Hydrocodone/APAP 7.5/325 1 tablet p.o. 3 times daily (22.5 mg/day of hydrocodone)  MME/day: 22.5 mg/day.   Monitoring: [place] PMP: PDMP reviewed during this encounter.       Pharmacotherapy: No side-effects or adverse reactions reported. Compliance: No problems identified. Effectiveness: Clinically acceptable.  Earlyne Iba, RN  06/19/2023  2:29 PM  Signed Nursing Pain Medication Assessment:  Safety precautions to be  maintained throughout the outpatient stay will include: orient to surroundings, keep bed in low position, maintain call bell within reach  at all times, provide assistance with transfer out of bed and ambulation.  Medication Inspection Compliance: Pill count conducted under aseptic conditions, in front of the patient. Neither the pills nor the bottle was removed from the patient's sight at any time. Once count was completed pills were immediately returned to the patient in their original bottle.  Medication: Oxycodone/APAP Pill/Patch Count:  8.5 of 90 pills remain Pill/Patch Appearance: Markings consistent with prescribed medication Bottle Appearance: Standard pharmacy container. Clearly labeled. Filled Date: 8 / 26 / 2024 Last Medication intake:  Today    No results found for: "CBDTHCR" No results found for: "D8THCCBX" No results found for: "D9THCCBX"  UDS:  Summary  Date Value Ref Range Status  03/22/2023 Note  Final    Comment:    ==================================================================== [facility] (MW) ==================================================================== Test                             Result       Flag       Units  Drug Present and Declared for Prescription Verification   Hydrocodone                    1894         EXPECTED   ng/mg creat   Hydromorphone                  329          EXPECTED   ng/mg creat   Dihydrocodeine                 292          EXPECTED   ng/mg creat   Norhydrocodone                 1758         EXPECTED   ng/mg creat    Sources of hydrocodone include scheduled prescription medications.    Hydromorphone, dihydrocodeine and norhydrocodone are expected    metabolites of hydrocodone. Hydromorphone and dihydrocodeine are    also available as scheduled prescription medications.    Oxymorphone                    335          EXPECTED   ng/mg creat   Noroxymorphone                 134          EXPECTED   ng/mg creat     Sources of oxymorphone are scheduled prescription medications;    oxymorphone is also a metabolite of oxycodone. Noroxymorphone is an    expected metabolite of oxymorphone.  Drug Absent but Declared for Prescription Verification   Oxycodone                      Not Detected UNEXPECTED ng/mg creat    Oxycodone is almost always present in patients taking this drug    consistently.  Absence of oxycodone could be due to lapse of time    since the last dose or unusual pharmacokinetics (rapid metabolism).  ==================================================================== Test                      Result    Flag   Units      Ref Range   Creatinine  65               mg/dL      >=09 ==================================================================== Declared Medications:  The flagging and interpretation on this report are based on the  following declared medications.  Unexpected results may arise from  inaccuracies in the declared medications.   **Note: The testing scope of this panel includes these medications:   Hydrocodone (Norco)  Oxycodone (Percocet)   **Note: The testing scope of this panel does not include the  following reported medications:   Acetaminophen (Norco)  Acetaminophen (Percocet)  Azelastine  Cetirizine (Zyrtec)  Escitalopram (Lexapro)  Fludrocortisone (Florinef)  Fluticasone (Flonase)  Hydrocortisone (Cortef)  Hyoscyamine  Lansoprazole (Prevacid)  Meloxicam (Mobic)  Methenamine  Methylene Blue  Naloxone (Narcan)  Omega-3 Fatty Acids  Pseudoephedrine (Sudafed)  Supplement  Vitamin D2 ==================================================================== For clinical consultation, please call [phone]. ====================================================================       ROS  Constitutional: Denies any fever or chills Gastrointestinal: No reported hemesis, hematochezia, vomiting, or acute GI distress Musculoskeletal: Denies any  acute onset joint swelling, redness, loss of ROM, or weakness Neurological: No reported episodes of acute onset apraxia, aphasia, dysarthria, agnosia, amnesia, paralysis, loss of coordination, or loss of consciousness  Medication Review  Azelastine HCl, HYDROcodone-acetaminophen, Urogesic-Blue, cetirizine, escitalopram, fludrocortisone, fluticasone, hydrocortisone, lansoprazole, meloxicam, naloxone, omega-3 acid ethyl esters, pseudoephedrine, and tiZANidine  History Review  Allergy: Denise Burns is allergic to sulfacetamide sodium (acne), tramadol, lisinopril, and sulfa antibiotics. Drug: Denise Burns  reports no history of drug use. Alcohol:  reports that she does not currently use alcohol. Tobacco:  reports that she quit smoking about 28 years ago. Her smoking use included cigarettes. She has never used smokeless tobacco. Social: Denise Burns  reports that she quit smoking about 28 years ago. Her smoking use included cigarettes. She has never used smokeless tobacco. She reports that she does not currently use alcohol. She reports that she does not use drugs. Medical:  has a past medical history of Addison disease (HCC), Arthritis, Back pain, Complication of anesthesia, GERD (gastroesophageal reflux disease), and History of kidney stones. Surgical: Denise Burns  has a past surgical history that includes Cholecystectomy (2010); Polypectomy; dental work; Dilation and curettage of uterus (2010); IR NEPHROSTOMY PLACEMENT LEFT (12/14/2020); Nephrolithotomy (Left, 12/14/2020); Wisdom tooth extraction; Diagnostic laparoscopy (2004); Cystoscopy/ureteroscopy/holmium laser/stent placement (Right, 06/28/2021); Cystoscopy w/ ureteral stent removal (Right, 08/09/2021); Cystoscopy w/ retrogrades (Right, 08/09/2021); Cystoscopy/ureteroscopy/holmium laser/stent placement (Right, 08/09/2021); and Colonoscopy with propofol (N/A, 06/08/2022). Family: family history includes Alcohol abuse in her father; Arthritis in her mother;  Brain cancer in her mother; Colon polyps (age of onset: 5) in her maternal grandmother; Hearing loss in her father; High blood pressure in her father and mother; Hyperlipidemia in her father and mother; Hypertension in her mother; Liver cancer in her mother; Lung cancer in her mother.  Laboratory Chemistry Profile   Renal Lab Results  Component Value Date   BUN 22 (H) 09/18/2022   CREATININE 0.72 09/18/2022   BCR 15 06/15/2022   GFR 84.36 08/26/2021   GFRAA 86 08/16/2018   GFRNONAA >60 09/18/2022    Hepatic Lab Results  Component Value Date   AST 36 09/16/2022   ALT 36 09/16/2022   ALBUMIN 4.1 09/16/2022   ALKPHOS 63 09/16/2022   LIPASE 38 09/16/2022    Electrolytes Lab Results  Component Value Date   NA 140 09/18/2022   K 4.6 09/18/2022   CL 108 09/18/2022   CALCIUM 8.7 (L) 09/18/2022   MG 2.2 09/18/2022  PHOS 4.3 09/18/2022    Bone Lab Results  Component Value Date   VD25OH 14.0 (L) 06/15/2022   25OHVITD1 67 08/16/2018   25OHVITD2 <1.0 08/16/2018   25OHVITD3 67 08/16/2018   TESTOSTERONE 1 (L) 08/13/2020    Inflammation (CRP: Acute Phase) (ESR: Chronic Phase) Lab Results  Component Value Date   CRP 2 08/16/2018   ESRSEDRATE 37 (H) 08/16/2018   LATICACIDVEN 1.9 09/16/2022         Note: Above Lab results reviewed.  Recent Imaging Review  DG Ankle Complete Left Please see detailed radiograph report in office note. DG Foot Complete Right Please see detailed radiograph report in office note. Note: Reviewed        Physical Exam  General appearance: Well nourished, well developed, and well hydrated. In no apparent acute distress Mental status: Alert, oriented x 3 (person, place, & time)       Respiratory: No evidence of acute respiratory distress Eyes: PERLA Vitals: BP (!) 143/96   Pulse (!) 104   Temp 97.9 F (36.6 C) (Temporal)   Resp 16   Ht 5\' 4"  (1.626 m)   Wt 173 lb (78.5 kg)   LMP 12/01/2020 (Approximate)   SpO2 98%   BMI 29.70 kg/m  BMI:  Estimated body mass index is 29.7 kg/m as calculated from the following:   Height as of this encounter: 5\' 4"  (1.626 m).   Weight as of this encounter: 173 lb (78.5 kg). Ideal: Ideal body weight: 54.7 kg (120 lb 9.5 oz) Adjusted ideal body weight: 64.2 kg (141 lb 8.9 oz)  Assessment   Diagnosis Status  1. Chronic hip pain (Bilateral)   2. Chronic low back pain (1ry area of Pain) (Bilateral) (R>L) w/o sciatica   3. Chronic sacroiliac joint pain (2ry area of Pain) (Right)   4. Lumbar facet syndrome (Bilateral)   5. Chronic pain syndrome   6. Pharmacologic therapy   7. Chronic use of opiate for therapeutic purpose   8. Encounter for medication management   9. Encounter for chronic pain management   10. Enthesopathy hip region (Left)   11. Osteoarthritis of hips (Bilateral)   12. Greater trochanteric bursitis (Bilateral)    Controlled Controlled Controlled   Updated Problems: No problems updated.  Plan of Care  Problem-specific:  No problem-specific Assessment & Plan notes found for this encounter.  Denise Burns has a current medication list which includes the following long-term medication(s): azelastine hcl, escitalopram, flonase sensimist, hydrocodone-acetaminophen, [START ON 07/27/2023] hydrocodone-acetaminophen, [START ON 08/26/2023] hydrocodone-acetaminophen, lansoprazole, meloxicam, and omega-3 acid ethyl esters.  Pharmacotherapy (Medications Ordered): Meds ordered this encounter  Medications   DISCONTD: HYDROcodone-acetaminophen (NORCO) 7.5-325 MG tablet    Sig: Take 1 tablet by mouth every 8 (eight) hours as needed for severe pain (pain score 7-10). Must last 30 days.    Dispense:  90 tablet    Refill:  0    DO NOT: delete (not duplicate); no partial-fill (will deny script to complete), no refill request (F/U required). DISPENSE: 1 day early if closed on fill date. WARN: No CNS-depressants within 8 hrs of med.   DISCONTD: HYDROcodone-acetaminophen (NORCO)  7.5-325 MG tablet    Sig: Take 1 tablet by mouth every 8 (eight) hours as needed for severe pain (pain score 7-10). Must last 30 days.    Dispense:  90 tablet    Refill:  0    DO NOT: delete (not duplicate); no partial-fill (will deny script to complete), no refill request (F/U  required). DISPENSE: 1 day early if closed on fill date. WARN: No CNS-depressants within 8 hrs of med.   DISCONTD: HYDROcodone-acetaminophen (NORCO) 7.5-325 MG tablet    Sig: Take 1 tablet by mouth every 8 (eight) hours as needed for severe pain (pain score 7-10). Must last 30 days.    Dispense:  90 tablet    Refill:  0    DO NOT: delete (not duplicate); no partial-fill (will deny script to complete), no refill request (F/U required). DISPENSE: 1 day early if closed on fill date. WARN: No CNS-depressants within 8 hrs of med.   HYDROcodone-acetaminophen (NORCO) 7.5-325 MG tablet    Sig: Take 1 tablet by mouth every 8 (eight) hours as needed for severe pain (pain score 7-10). Must last 30 days.    Dispense:  90 tablet    Refill:  0    DO NOT: delete (not duplicate); no partial-fill (will deny script to complete), no refill request (F/U required). DISPENSE: 1 day early if closed on fill date. WARN: No CNS-depressants within 8 hrs of med.   HYDROcodone-acetaminophen (NORCO) 7.5-325 MG tablet    Sig: Take 1 tablet by mouth every 8 (eight) hours as needed for severe pain (pain score 7-10). Must last 30 days.    Dispense:  90 tablet    Refill:  0    DO NOT: delete (not duplicate); no partial-fill (will deny script to complete), no refill request (F/U required). DISPENSE: 1 day early if closed on fill date. WARN: No CNS-depressants within 8 hrs of med.   HYDROcodone-acetaminophen (NORCO) 7.5-325 MG tablet    Sig: Take 1 tablet by mouth every 8 (eight) hours as needed for severe pain (pain score 7-10). Must last 30 days.    Dispense:  90 tablet    Refill:  0    DO NOT: delete (not duplicate); no partial-fill (will deny script  to complete), no refill request (F/U required). DISPENSE: 1 day early if closed on fill date. WARN: No CNS-depressants within 8 hrs of med.   Orders:  Orders Placed This Encounter  Procedures   HIP INJECTION    Standing Status:   Future    Standing Expiration Date:   09/19/2023    Scheduling Instructions:     Procedure: Intra-articular Hip joint injection     Side: Bilateral     Approach: Lateral     Sedation: Patient's choice.     Timeframe: As soon as schedule allows   Follow-up plan:   Return for (ECT): (B) IA Hip + TBI RR4L3.      Interventional Therapies  Risk  Complexity Considerations:   WNL   Planned  Pending:   Therapeutic bilateral IA hip + bilateral trochanteric bursa inj. R4L3  Diagnostic/therapeutic bilateral lumbar facet MBB #4 + bilateral trochanteric bursa inj. #4    Under consideration:   Therapeutic bilateral lumbar facet medial branch RFA #3    Completed:   Therapeutic bilateral IA hip injection (posterolateral approach) R3L2 (09/06/2022) (100/100/90/90)  Therapeutic bilateral lumbar facet MBB x3 (09/08/2020) (4-0) (100/100/60/60)  Therapeutic right lumbar facet RFA x2 (09/09/2021) (4-2) (100/100/90/>75)  Therapeutic left lumbar facet RFA x2 (10/07/2021) (4-0) (100/100/90/>75)  Therapeutic right IA hip inj. x2 (08/20/2020) (5-1) (100/100/80/80)  Therapeutic left IA hip inj. x1 (08/20/2020) (5-1) (100/100/80/80)  Therapeutic bilateral trochanteric bursa inj. x3 (04/07/2022)  (100/100/90/90)    Therapeutic  Palliative (PRN) options:   Palliative lumbar facet MBB   Palliative lumbar facet RFA  Palliative trochanteric bursa inj.  Pharmacotherapy  Nonopioids transferred 05/11/2020: Baclofen and Mobic       Recent Visits Date Type Provider Dept  06/19/23 Office Visit Delano Metz, MD [facility]  04/20/23 Office Visit Delano Metz, MD [facility]  Showing recent visits within past 90 days and meeting all other  requirements Today's Visits Date Type Provider Dept  06/27/23 Appointment Delano Metz, MD [facility]  Showing today's visits and meeting all other requirements Future Appointments Date Type Provider Dept  09/20/23 Appointment Delano Metz, MD [facility]  Showing future appointments within next 90 days and meeting all other requirements  I discussed the assessment and treatment plan with the patient. The patient was provided an opportunity to ask questions and all were answered. The patient agreed with the plan and demonstrated an understanding of the instructions.  Patient advised to call back or seek an in-person evaluation if the symptoms or condition worsens.  Duration of encounter: 30 minutes.  Total time on encounter, as per AMA guidelines included both the face-to-face and non-face-to-face time personally spent by the physician and/or other qualified health care professional(s) on the day of the encounter (includes time in activities that require the physician or other qualified health care professional and does not include time in activities normally performed by clinical staff). Physician's time may include the following activities when performed: Preparing to see the patient (e.g., pre-charting review of records, searching for previously ordered imaging, lab work, and nerve conduction tests) Review of prior analgesic pharmacotherapies. Reviewing PMP Interpreting ordered tests (e.g., lab work, imaging, nerve conduction tests) Performing post-procedure evaluations, including interpretation of diagnostic procedures Obtaining and/or reviewing separately obtained history Performing a medically appropriate examination and/or evaluation Counseling and educating the patient/family/caregiver Ordering medications, tests, or procedures Referring and communicating with other health care professionals (when not separately reported) Documenting clinical  information in the electronic or other health record Independently interpreting results (not separately reported) and communicating results to the patient/ family/caregiver Care coordination (not separately reported)  Note by: Oswaldo Done, MD Date: 06/19/2023; Time: 5:31 AM

## 2023-06-19 ENCOUNTER — Ambulatory Visit: Payer: BC Managed Care – PPO | Attending: Pain Medicine | Admitting: Pain Medicine

## 2023-06-19 ENCOUNTER — Encounter: Payer: Self-pay | Admitting: Pain Medicine

## 2023-06-19 ENCOUNTER — Telehealth: Payer: Self-pay

## 2023-06-19 VITALS — BP 143/96 | HR 104 | Temp 97.9°F | Resp 16 | Ht 64.0 in | Wt 173.0 lb

## 2023-06-19 DIAGNOSIS — M7062 Trochanteric bursitis, left hip: Secondary | ICD-10-CM | POA: Diagnosis not present

## 2023-06-19 DIAGNOSIS — G8929 Other chronic pain: Secondary | ICD-10-CM | POA: Insufficient documentation

## 2023-06-19 DIAGNOSIS — Z79891 Long term (current) use of opiate analgesic: Secondary | ICD-10-CM | POA: Insufficient documentation

## 2023-06-19 DIAGNOSIS — M160 Bilateral primary osteoarthritis of hip: Secondary | ICD-10-CM | POA: Diagnosis not present

## 2023-06-19 DIAGNOSIS — G894 Chronic pain syndrome: Secondary | ICD-10-CM | POA: Diagnosis not present

## 2023-06-19 DIAGNOSIS — M47816 Spondylosis without myelopathy or radiculopathy, lumbar region: Secondary | ICD-10-CM | POA: Insufficient documentation

## 2023-06-19 DIAGNOSIS — M25551 Pain in right hip: Secondary | ICD-10-CM | POA: Insufficient documentation

## 2023-06-19 DIAGNOSIS — M7061 Trochanteric bursitis, right hip: Secondary | ICD-10-CM | POA: Diagnosis not present

## 2023-06-19 DIAGNOSIS — M25552 Pain in left hip: Secondary | ICD-10-CM | POA: Insufficient documentation

## 2023-06-19 DIAGNOSIS — M545 Low back pain, unspecified: Secondary | ICD-10-CM | POA: Insufficient documentation

## 2023-06-19 DIAGNOSIS — Z79899 Other long term (current) drug therapy: Secondary | ICD-10-CM | POA: Insufficient documentation

## 2023-06-19 DIAGNOSIS — M533 Sacrococcygeal disorders, not elsewhere classified: Secondary | ICD-10-CM | POA: Diagnosis not present

## 2023-06-19 DIAGNOSIS — M76892 Other specified enthesopathies of left lower limb, excluding foot: Secondary | ICD-10-CM | POA: Insufficient documentation

## 2023-06-19 MED ORDER — HYDROCODONE-ACETAMINOPHEN 7.5-325 MG PO TABS: 1.0000 | ORAL_TABLET | Freq: Three times a day (TID) | ORAL | 0 refills | Status: DC | PRN

## 2023-06-19 MED ORDER — HYDROCODONE-ACETAMINOPHEN 7.5-325 MG PO TABS
1.0000 | ORAL_TABLET | Freq: Three times a day (TID) | ORAL | 0 refills | Status: DC | PRN
Start: 2023-08-26 — End: 2023-06-19

## 2023-06-19 MED ORDER — HYDROCODONE-ACETAMINOPHEN 7.5-325 MG PO TABS
1.0000 | ORAL_TABLET | Freq: Three times a day (TID) | ORAL | 0 refills | Status: DC | PRN
Start: 2023-06-27 — End: 2023-06-19

## 2023-06-19 NOTE — Progress Notes (Signed)
Nursing Pain Medication Assessment:  Safety precautions to be maintained throughout the outpatient stay will include: orient to surroundings, keep bed in low position, maintain call bell within reach at all times, provide assistance with transfer out of bed and ambulation.  Medication Inspection Compliance: Pill count conducted under aseptic conditions, in front of the patient. Neither the pills nor the bottle was removed from the patient's sight at any time. Once count was completed pills were immediately returned to the patient in their original bottle.  Medication: Oxycodone/APAP Pill/Patch Count:  8.5 of 90 pills remain Pill/Patch Appearance: Markings consistent with prescribed medication Bottle Appearance: Standard pharmacy container. Clearly labeled. Filled Date: 52 / 26 / 2024 Last Medication intake:  Today

## 2023-06-27 ENCOUNTER — Ambulatory Visit
Admission: RE | Admit: 2023-06-27 | Discharge: 2023-06-27 | Disposition: A | Discharge status: Home / Self Care | Payer: BC Managed Care – PPO | Source: Ambulatory Visit | Attending: Pain Medicine | Admitting: Pain Medicine

## 2023-06-27 ENCOUNTER — Encounter: Payer: Self-pay | Admitting: Pain Medicine

## 2023-06-27 ENCOUNTER — Ambulatory Visit: Payer: BC Managed Care – PPO | Attending: Pain Medicine | Admitting: Pain Medicine

## 2023-06-27 VITALS — BP 131/66 | HR 83 | Temp 97.2°F | Resp 16 | Ht 64.0 in | Wt 173.0 lb

## 2023-06-27 DIAGNOSIS — M25551 Pain in right hip: Secondary | ICD-10-CM | POA: Insufficient documentation

## 2023-06-27 DIAGNOSIS — M7062 Trochanteric bursitis, left hip: Secondary | ICD-10-CM | POA: Insufficient documentation

## 2023-06-27 DIAGNOSIS — M7061 Trochanteric bursitis, right hip: Secondary | ICD-10-CM | POA: Insufficient documentation

## 2023-06-27 DIAGNOSIS — M160 Bilateral primary osteoarthritis of hip: Secondary | ICD-10-CM | POA: Diagnosis not present

## 2023-06-27 DIAGNOSIS — M76892 Other specified enthesopathies of left lower limb, excluding foot: Secondary | ICD-10-CM | POA: Diagnosis not present

## 2023-06-27 DIAGNOSIS — M25552 Pain in left hip: Secondary | ICD-10-CM | POA: Insufficient documentation

## 2023-06-27 DIAGNOSIS — G8929 Other chronic pain: Secondary | ICD-10-CM | POA: Diagnosis not present

## 2023-06-27 MED ORDER — ROPIVACAINE HCL 2 MG/ML IJ SOLN
INTRAMUSCULAR | Status: AC
  Filled 2023-06-27: qty 20

## 2023-06-27 MED ORDER — IOHEXOL 180 MG/ML  SOLN
10.0000 mL | Freq: Once | INTRAMUSCULAR | Status: AC
  Administered 2023-06-27: 10 mL via INTRA_ARTICULAR

## 2023-06-27 MED ORDER — PENTAFLUOROPROP-TETRAFLUOROETH EX AERO
INHALATION_SPRAY | Freq: Once | CUTANEOUS | Status: AC
  Administered 2023-06-27: 30 via TOPICAL

## 2023-06-27 MED ORDER — METHYLPREDNISOLONE ACETATE 80 MG/ML IJ SUSP
160.0000 mg | Freq: Once | INTRAMUSCULAR | Status: AC
  Administered 2023-06-27: 160 mg via INTRA_ARTICULAR

## 2023-06-27 MED ORDER — IOHEXOL 180 MG/ML  SOLN
INTRAMUSCULAR | Status: AC
  Filled 2023-06-27: qty 10

## 2023-06-27 MED ORDER — LACTATED RINGERS IV SOLN: Freq: Once | INTRAVENOUS | Status: AC

## 2023-06-27 MED ORDER — FENTANYL CITRATE (PF) 100 MCG/2ML IJ SOLN
25.0000 ug | INTRAMUSCULAR | Status: DC | PRN
  Administered 2023-06-27: 50 ug via INTRAVENOUS

## 2023-06-27 MED ORDER — LIDOCAINE HCL 2 % IJ SOLN
20.0000 mL | Freq: Once | INTRAMUSCULAR | Status: AC
  Administered 2023-06-27: 400 mg

## 2023-06-27 MED ORDER — LIDOCAINE HCL 2 % IJ SOLN
INTRAMUSCULAR | Status: AC
  Filled 2023-06-27: qty 20

## 2023-06-27 MED ORDER — MIDAZOLAM HCL 5 MG/5ML IJ SOLN
INTRAMUSCULAR | Status: AC
  Filled 2023-06-27: qty 5

## 2023-06-27 MED ORDER — METHYLPREDNISOLONE ACETATE 80 MG/ML IJ SUSP
INTRAMUSCULAR | Status: AC
  Filled 2023-06-27: qty 2

## 2023-06-27 MED ORDER — FENTANYL CITRATE (PF) 100 MCG/2ML IJ SOLN
INTRAMUSCULAR | Status: AC
  Filled 2023-06-27: qty 2

## 2023-06-27 MED ORDER — MIDAZOLAM HCL 5 MG/5ML IJ SOLN
0.5000 mg | Freq: Once | INTRAMUSCULAR | Status: AC
  Administered 2023-06-27: 3 mg via INTRAVENOUS

## 2023-06-27 MED ORDER — ROPIVACAINE HCL 2 MG/ML IJ SOLN
18.0000 mL | Freq: Once | INTRAMUSCULAR | Status: AC
  Administered 2023-06-27: 18 mL via INTRA_ARTICULAR

## 2023-06-27 NOTE — Progress Notes (Signed)
PROVIDER NOTE: Interpretation of information contained herein should be left to medically-trained personnel. Specific patient instructions are provided elsewhere under "Patient Instructions" section of medical record. This document was created in part using STT-dictation technology, any transcriptional errors that may result from this process are unintentional.  Patient: Denise Burns Type: Established DOB: Dec 30, 1970 MRN: [id number] PCP: Sandre Kitty, MD  Service: Procedure DOS: 06/27/2023 Setting: Ambulatory Location: Ambulatory outpatient facility Delivery: Face-to-face Provider: Oswaldo Done, MD Specialty: Interventional Pain Management Specialty designation: 09 Location: Outpatient facility Ref. Prov.: Delano Metz, MD       Interventional Therapy   Type: Hip & bursae injection  R4L3   Laterality: Bilateral (-50)  Bursae: Trochanteric  Laterality: Bilateral (-50)  Approach: Percutaneous posterolateral approach. Level: Lower pelvic and hip joint level.  Imaging: Fluoroscopy-guided Non-spinal ([id number]) Anesthesia: Local anesthesia (1-2% Lidocaine) Anxiolysis: IV Versed 3.0 mg Sedation: Moderate Sedation Fentanyl 1 mL (50 mcg) DOS: 06/27/2023  Performed by: Oswaldo Done, MD  Purpose: Diagnostic/Therapeutic Indications: Hip pain severe enough to impact quality of life or function. Rationale (medical necessity): procedure needed and proper for the diagnosis and/or treatment of Denise Burns's medical symptoms and needs. 1. Chronic hip pain (Bilateral)   2. Enthesopathy hip region (Left)   3. Greater trochanteric bursitis (Bilateral)   4. Osteoarthritis of hips (Bilateral)    NAS-11 Pain score:   Pre-procedure: 4 /10   Post-procedure: 0-No pain/10      Target: Intra-articular aspect of the hip joint & peri-articular bursae Region: Hip joint proper. Femoral region Procedure Type: Percutaneous injection   Position / Prep / Materials:   Position: Prone  Prep solution: ChloraPrep (2% chlorhexidine gluconate and 70% isopropyl alcohol) Prep Area:  Entire Posterolateral hip area. Materials:  Tray: Block tray Needle(s):  Type: Spinal  Gauge (G): 22  Length: 7-in  Qty: 2  H&P (Pre-op Assessment):  Denise Burns is a 52 y.o. (year old), female patient, seen today for interventional treatment. She  has a past surgical history that includes Cholecystectomy (2010); Polypectomy; dental work; Dilation and curettage of uterus (2010); IR NEPHROSTOMY PLACEMENT LEFT (12/14/2020); Nephrolithotomy (Left, 12/14/2020); Wisdom tooth extraction; Diagnostic laparoscopy (2004); Cystoscopy/ureteroscopy/holmium laser/stent placement (Right, 06/28/2021); Cystoscopy w/ ureteral stent removal (Right, 08/09/2021); Cystoscopy w/ retrogrades (Right, 08/09/2021); Cystoscopy/ureteroscopy/holmium laser/stent placement (Right, 08/09/2021); and Colonoscopy with propofol (N/A, 06/08/2022). Denise Burns has a current medication list which includes the following prescription(s): azelastine hcl, cetirizine, escitalopram, fludrocortisone, flonase sensimist, hydrocodone-acetaminophen, [START ON 07/27/2023] hydrocodone-acetaminophen, [START ON 08/26/2023] hydrocodone-acetaminophen, hydrocortisone, lansoprazole, meloxicam, urogesic-blue, naloxone, omega-3 acid ethyl esters, pseudoephedrine, and tizanidine, and the following Facility-Administered Medications: fentanyl. Her primarily concern today is the No chief complaint on file.  Initial Vital Signs:  Pulse/HCG Rate: 83ECG Heart Rate: 81 (NSR) Temp: (!) 97.2 F (36.2 C) Resp: 16 BP: 135/85 SpO2: 100 %  BMI: Estimated body mass index is 29.7 kg/m as calculated from the following:   Height as of this encounter: 5\' 4"  (1.626 m).   Weight as of this encounter: 173 lb (78.5 kg).  Risk Assessment: Allergies: Reviewed. She is allergic to tramadol, lisinopril, and sulfa antibiotics.  Allergy Precautions: None  required Coagulopathies: Reviewed. None identified.  Blood-thinner therapy: None at this time Active Infection(s): Reviewed. None identified. Denise Burns is afebrile  Site Confirmation: Denise Burns was asked to confirm the procedure and laterality before marking the site Procedure checklist: Completed Consent: Before the procedure and under the influence of no sedative(s), amnesic(s), or anxiolytics, the patient was informed of the treatment options, risks  and possible complications. To fulfill our ethical and legal obligations, as recommended by the American Medical Association's Code of Ethics, I have informed the patient of my clinical impression; the nature and purpose of the treatment or procedure; the risks, benefits, and possible complications of the intervention; the alternatives, including doing nothing; the risk(s) and benefit(s) of the alternative treatment(s) or procedure(s); and the risk(s) and benefit(s) of doing nothing. The patient was provided information about the general risks and possible complications associated with the procedure. These may include, but are not limited to: failure to achieve desired goals, infection, bleeding, organ or nerve damage, allergic reactions, paralysis, and death. In addition, the patient was informed of those risks and complications associated to the procedure, such as failure to decrease pain; infection; bleeding; organ or nerve damage with subsequent damage to sensory, motor, and/or autonomic systems, resulting in permanent pain, numbness, and/or weakness of one or several areas of the body; allergic reactions; (i.e.: anaphylactic reaction); and/or death. Furthermore, the patient was informed of those risks and complications associated with the medications. These include, but are not limited to: allergic reactions (i.e.: anaphylactic or anaphylactoid reaction(s)); adrenal axis suppression; blood sugar elevation that in diabetics may result in  ketoacidosis or comma; water retention that in patients with history of congestive heart failure may result in shortness of breath, pulmonary edema, and decompensation with resultant heart failure; weight gain; swelling or edema; medication-induced neural toxicity; particulate matter embolism and blood vessel occlusion with resultant organ, and/or nervous system infarction; and/or aseptic necrosis of one or more joints. Finally, the patient was informed that Medicine is not an exact science; therefore, there is also the possibility of unforeseen or unpredictable risks and/or possible complications that may result in a catastrophic outcome. The patient indicated having understood very clearly. We have given the patient no guarantees and we have made no promises. Enough time was given to the patient to ask questions, all of which were answered to the patient's satisfaction. Denise Burns has indicated that she wanted to continue with the procedure. Attestation: I, the ordering provider, attest that I have discussed with the patient the benefits, risks, side-effects, alternatives, likelihood of achieving goals, and potential problems during recovery for the procedure that I have provided informed consent. Date  Time: 06/27/2023  8:12 AM  Pre-Procedure Preparation:  Monitoring: As per clinic protocol. Respiration, ETCO2, SpO2, BP, heart rate and rhythm monitor placed and checked for adequate function Safety Precautions: Patient was assessed for positional comfort and pressure points before starting the procedure. Time-out: I initiated and conducted the "Time-out" before starting the procedure, as per protocol. The patient was asked to participate by confirming the accuracy of the "Time Out" information. Verification of the correct person, site, and procedure were performed and confirmed by me, the nursing staff, and the patient. "Time-out" conducted as per Joint Commission's Universal Protocol  (UP.01.01.01). Time: 0839 Start Time: 0839 hrs.  Narrative                Rationale (medical necessity): procedure needed and proper for the diagnosis and/or treatment of the patient's medical symptoms and needs. Procedural Technique Safety Precautions: Aspiration looking for blood return was conducted prior to all injections. At no point did we inject any substances, as a needle was being advanced. No attempts were made at seeking any paresthesias. Safe injection practices and needle disposal techniques used. Medications properly checked for expiration dates. SDV (single dose vial) medications used. Description of the Procedure: Protocol guidelines were followed. The  patient was assisted into a comfortable position. The target area was identified and the area prepped in the usual manner. Skin & deeper tissues infiltrated with local anesthetic. Appropriate amount of time allowed to pass for local anesthetics to take effect. The procedure needles were then advanced to the target area. Proper needle placement secured. Negative aspiration confirmed. Solution injected in intermittent fashion, asking for systemic symptoms every 0.5cc of injectate. The needles were then removed and the area cleansed, making sure to leave some of the prepping solution back to take advantage of its long term bactericidal properties.  Technical description of procedure:  Skin & deeper tissues infiltrated with local anesthetic. Appropriate amount of time allowed to pass for local anesthetics to take effect. The procedure needles were then advanced to the target area. Proper needle placement secured. Negative aspiration confirmed. Solution injected in intermittent fashion, asking for systemic symptoms every 0.5cc of injectate. The needles were then removed and the area cleansed, making sure to leave some of the prepping solution back to take advantage of its long term bactericidal properties.             Vitals:   06/27/23  0854 06/27/23 0900 06/27/23 0910 06/27/23 0920  BP:  115/69 127/64 131/66  Pulse:      Resp: 13 16 (!) 23 16  Temp:  97.9 F (36.6 C)  (!) 97.2 F (36.2 C)  TempSrc:  Temporal  Temporal  SpO2: 98% 92% 95% 93%  Weight:      Height:         Start Time: 0839 hrs. End Time: 0854 hrs.  Imaging Guidance (Non-Spinal):          Type of Imaging Technique: Fluoroscopy Guidance (Non-Spinal) Indication(s): Fluoroscopy guidance for needle placement to enhance accuracy in procedures requiring precise needle localization for targeted delivery of medication in or near specific anatomical locations not easily accessible without such real-time imaging assistance. Exposure Time: Please see nurses notes. Contrast: Before injecting any contrast, we confirmed that the patient did not have an allergy to iodine, shellfish, or radiological contrast. Once satisfactory needle placement was completed at the desired level, radiological contrast was injected. Contrast injected under live fluoroscopy. No contrast complications. See chart for type and volume of contrast used. Fluoroscopic Guidance: I was personally present during the use of fluoroscopy. "Tunnel Vision Technique" used to obtain the best possible view of the target area. Parallax error corrected before commencing the procedure. "Direction-depth-direction" technique used to introduce the needle under continuous pulsed fluoroscopy. Once target was reached, antero-posterior, oblique, and lateral fluoroscopic projection used confirm needle placement in all planes. Images permanently stored in EMR. Interpretation: I personally interpreted the imaging intraoperatively. Adequate needle placement confirmed in multiple planes. Appropriate spread of contrast into desired area was observed. No evidence of afferent or efferent intravascular uptake. Permanent images saved into the patient's record.  Post-operative Assessment:  Post-procedure Vital Signs:  Pulse/HCG  Rate: 8371 Temp: (!) 97.2 F (36.2 C) Resp: 16 BP: 131/66 SpO2: 93 %  EBL: None  Complications: No immediate post-treatment complications observed by team, or reported by patient.  Note: The patient tolerated the entire procedure well. A repeat set of vitals were taken after the procedure and the patient was kept under observation following institutional policy, for this type of procedure. Post-procedural neurological assessment was performed, showing return to baseline, prior to discharge. The patient was provided with post-procedure discharge instructions, including a section on how to identify potential problems. Should any problems arise concerning  this procedure, the patient was given instructions to immediately contact us, at any time, without hesitation. In any case, we plan to contact the patient by telephone for a follow-up status report regarding this interventional procedure.  Comments:  No additional relevant information.  Plan of Care (POC)  Orders:  Orders Placed This Encounter  Procedures   HIP INJECTION    Scheduling Instructions:     Side: Bilateral     Sedation: With Sedation.     Timeframe: Today   DG PAIN CLINIC C-ARM 1-60 MIN NO REPORT    Intraoperative interpretation by procedural physician at [facility].    Standing Status:   Standing    Number of Occurrences:   1    Order Specific Question:   Reason for exam:    Answer:   Assistance in needle guidance and placement for procedures requiring needle placement in or near specific anatomical locations not easily accessible without such assistance.   Informed Consent Details: Physician/Practitioner Attestation; Transcribe to consent form and obtain patient signature    Nursing Order: Transcribe to consent form and obtain patient signature. Note: Always confirm laterality of pain with Denise Burns, before procedure.    Order Specific Question:   Physician/Practitioner attestation of informed consent for  procedure/surgical case    Answer:   I, the physician/practitioner, attest that I have discussed with the patient the benefits, risks, side effects, alternatives, likelihood of achieving goals and potential problems during recovery for the procedure that I have provided informed consent.    Order Specific Question:   Procedure    Answer:   Hip injection    Order Specific Question:   Physician/Practitioner performing the procedure    Answer:   Jacquel Redditt A. Laban Emperor, MD    Order Specific Question:   Indication/Reason    Answer:   Hip Joint Pain (Arthralgia)   Provide equipment / supplies at bedside    Procedure tray: "Block Tray" (Disposable  single use) Skin infiltration needle: Regular 1.5-in, 25-G, (x1) Block Needle type: Spinal Amount/quantity: 2 Size: Long (7-inch) Gauge: 22G    Standing Status:   Standing    Number of Occurrences:   1    Order Specific Question:   Specify    Answer:   Block Tray   Chronic Opioid Analgesic:  Hydrocodone/APAP 7.5/325 1 tablet p.o. 3 times daily (22.5 mg/day of hydrocodone)  MME/day: 22.5 mg/day.   Medications ordered for procedure: Meds ordered this encounter  Medications   iohexol (OMNIPAQUE) 180 MG/ML injection 10 mL    Must be Myelogram-compatible. If not available, you may substitute with a water-soluble, non-ionic, hypoallergenic, myelogram-compatible radiological contrast medium.   lidocaine (XYLOCAINE) 2 % (with pres) injection 400 mg   pentafluoroprop-tetrafluoroeth (GEBAUERS) aerosol   lactated ringers infusion   midazolam (VERSED) 5 MG/5ML injection 0.5-2 mg    Make sure Flumazenil is available in the pyxis when using this medication. If oversedation occurs, administer 0.2 mg IV over 15 sec. If after 45 sec no response, administer 0.2 mg again over 1 min; may repeat at 1 min intervals; not to exceed 4 doses (1 mg)   fentaNYL (SUBLIMAZE) injection 25-50 mcg    Make sure Narcan is available in the pyxis when using this medication. In the  event of respiratory depression (RR< 8/min): Titrate NARCAN (naloxone) in increments of 0.1 to 0.2 mg IV at 2-3 minute intervals, until desired degree of reversal.   ropivacaine (PF) 2 mg/mL (0.2%) (NAROPIN) injection 18 mL   methylPREDNISolone  acetate (DEPO-MEDROL) injection 160 mg   Medications administered: We administered iohexol, lidocaine, pentafluoroprop-tetrafluoroeth, lactated ringers, midazolam, fentaNYL, ropivacaine (PF) 2 mg/mL (0.2%), and methylPREDNISolone acetate.  See the medical record for exact dosing, route, and time of administration.  Follow-up plan:   Return in about 2 weeks (around 07/11/2023) for (Face2F), (PPE).       Interventional Therapies  Risk  Complexity Considerations:   WNL   Planned  Pending:   Therapeutic bilateral IA hip + bilateral trochanteric bursa inj. R4L3 (06/27/2023) Diagnostic/therapeutic bilateral lumbar facet MBB #4 + bilateral trochanteric bursa inj. #4    Under consideration:   Therapeutic bilateral lumbar facet medial branch RFA #3    Completed:   Therapeutic bilateral IA hip injection (posterolateral approach) R3L2 (09/06/2022) (100/100/90/90)  Therapeutic bilateral lumbar facet MBB x3 (09/08/2020) (4-0) (100/100/60/60)  Therapeutic right lumbar facet RFA x2 (09/09/2021) (4-2) (100/100/90/>75)  Therapeutic left lumbar facet RFA x2 (10/07/2021) (4-0) (100/100/90/>75)  Therapeutic right IA hip inj. x2 (08/20/2020) (5-1) (100/100/80/80)  Therapeutic left IA hip inj. x1 (08/20/2020) (5-1) (100/100/80/80)  Therapeutic bilateral trochanteric bursa inj. x3 (04/07/2022)  (100/100/90/90)    Therapeutic  Palliative (PRN) options:   Palliative lumbar facet MBB   Palliative lumbar facet RFA  Palliative trochanteric bursa inj.    Pharmacotherapy  Nonopioids transferred 05/11/2020: Baclofen and Mobic       Recent Visits Date Type Provider Dept  06/19/23 Office Visit Delano Metz, MD [facility]  04/20/23 Office Visit  Delano Metz, MD [facility]  Showing recent visits within past 90 days and meeting all other requirements Today's Visits Date Type Provider Dept  06/27/23 Procedure visit Delano Metz, MD [facility]  Showing today's visits and meeting all other requirements Future Appointments Date Type Provider Dept  07/11/23 Appointment Delano Metz, MD [facility]  09/20/23 Appointment Delano Metz, MD [facility]  Showing future appointments within next 90 days and meeting all other requirements  Disposition: Discharge home  Discharge (Date  Time): 06/27/2023; 0922 hrs.   Primary Care Physician: Sandre Kitty, MD Location: [facility] Note by: Oswaldo Done, MD (TTS technology used. I apologize for any typographical errors that were not detected and corrected.) Date: 06/27/2023; Time: 9:56 AM  Disclaimer:  Medicine is not an Visual merchandiser. The only guarantee in medicine is that nothing is guaranteed. It is important to note that the decision to proceed with this intervention was based on the information collected from the patient. The Data and conclusions were drawn from the patient's questionnaire, the interview, and the physical examination. Because the information was provided in large part by the patient, it cannot be guaranteed that it has not been purposely or unconsciously manipulated. Every effort has been made to obtain as much relevant data as possible for this evaluation. It is important to note that the conclusions that lead to this procedure are derived in large part from the available data. Always take into account that the treatment will also be dependent on availability of resources and existing treatment guidelines, considered by other Pain Management Practitioners as being common knowledge and practice, at the time of the intervention. For Medico-Legal purposes, it is also important to  point out that variation in procedural techniques and pharmacological choices are the acceptable norm. The indications, contraindications, technique, and results of the above procedure should only be interpreted and judged by a Board-Certified Interventional Pain Specialist with extensive familiarity and expertise in the same exact procedure and  technique.

## 2023-06-27 NOTE — Patient Instructions (Signed)

## 2023-06-28 ENCOUNTER — Telehealth: Payer: Self-pay | Admitting: *Deleted

## 2023-06-28 NOTE — Telephone Encounter (Signed)
No problems post procedure. 

## 2023-07-11 ENCOUNTER — Ambulatory Visit (HOSPITAL_BASED_OUTPATIENT_CLINIC_OR_DEPARTMENT_OTHER): Payer: BC Managed Care – PPO | Admitting: Pain Medicine

## 2023-07-11 DIAGNOSIS — Z09 Encounter for follow-up examination after completed treatment for conditions other than malignant neoplasm: Secondary | ICD-10-CM

## 2023-07-11 DIAGNOSIS — Z91199 Patient's noncompliance with other medical treatment and regimen due to unspecified reason: Secondary | ICD-10-CM

## 2023-07-11 NOTE — Progress Notes (Signed)
(  07/11/2023) NO-SHOW to postprocedure evaluation.

## 2023-07-24 ENCOUNTER — Telehealth: Payer: Self-pay | Admitting: Pain Medicine

## 2023-07-24 NOTE — Telephone Encounter (Signed)
PT called stated that her prescription is due for pick on the 26th and wants to see if she can pick up on 07-25-23, due to her going out of town. Please give patient a call. TY

## 2023-07-24 NOTE — Telephone Encounter (Signed)
Attempted to call [facility], was unable to speak with pharmacist after 18 minutes on hold. Patient informed, will try again later.

## 2023-08-27 ENCOUNTER — Other Ambulatory Visit: Payer: Self-pay

## 2023-08-28 DIAGNOSIS — G8929 Other chronic pain: Secondary | ICD-10-CM | POA: Insufficient documentation

## 2023-08-28 NOTE — Progress Notes (Unsigned)
PROVIDER NOTE: Information contained herein reflects review and annotations entered in association with encounter. Interpretation of such information and data should be left to medically-trained personnel. Information provided to patient can be located elsewhere in the medical record under "Patient Instructions". Document created using STT-dictation technology, any transcriptional errors that may result from process are unintentional.    Patient: Denise Burns  Service Category: E/M  Provider: Oswaldo Done, MD  DOB: September 19, 1970  DOS: 08/30/2023  Referring Provider: Sandre Kitty, MD  MRN: [id number]  Specialty: Interventional Pain Management  PCP: Denise Kitty, MD  Type: Established Patient  Setting: Ambulatory outpatient    Location: Office  Delivery: Face-to-face     HPI  [name], a 53 y.o. year old female, is here today because of her Right lumbar radiculitis [M54.16]. [name]'s primary complain today is Hip Pain (Right groin)  Pertinent problems: [name] has Chronic midline low back pain with bilateral sciatica; Chronic low back pain (1ry area of Pain) (Bilateral) (R>L) w/o sciatica; Chronic sacroiliac joint pain (2ry area of Pain) (Right); Chronic pain syndrome; Abnormal MRI, lumbar spine (03/22/2018); Lumbar facet hypertrophy (Multilevel) (Bilateral); Lumbar facet syndrome (Bilateral); DDD (degenerative disc disease), lumbosacral; Spondylolisthesis (8 mm) at L5-S1 level; Lumbar Grade 1 Retrolisthesis (2 mm) of L2/L3; L5-S1 pars defect w/ spondylolisthesis (Bilateral); Lumbar pars defect (L5-S1) (Bilateral); Lumbar foraminal stenosis (Left: L2-3) (Bilateral: L5-S1); Osteoarthritis of sacroiliac joint (Bilateral); Chronic musculoskeletal pain; Neurogenic pain; Osteoarthritis of facet joint of lumbar spine; Greater trochanteric bursitis (Bilateral); Lumbar spondylosis; Spondylosis without myelopathy or radiculopathy, lumbosacral region; Chronic hip pain (Right);  Osteoarthritis of hip (Right); Lumbar radiculitis (L1/L2) (Right); Chronic groin pain (Right); Chronic hip pain (Bilateral); Enthesopathy hip region (Left); Osteoarthritis of hips (Bilateral); Kissing spine syndrome; Chronic low back pain (Midline) w/o sciatica; Trigger point with back pain (PSIS area) (Right); Lumbar facet joint pain; Broken toes (Right); Closed displaced fracture of distal phalanx of lesser toe of right foot; Chronic lower extremity pain (Right); Lumbosacral radiculopathy at L2 (Right); Lumbosacral radiculopathy at L3 (Right); and Weakness of lower extremity on their pertinent problem list. Pain Assessment: Severity of Chronic pain is reported as a 3 /10. Location: Hip Right, Left/radiates into right groin and in front to right knee and leg gives out. Onset: More than a month ago. Quality: Aching. Timing: Constant. Modifying factor(s): meds. Vitals:  height is 5\' 4"  (1.626 m) and weight is 175 lb (79.4 kg). Her temperature is 97.6 F (36.4 C). Her blood pressure is 142/82 (abnormal) and her pulse is 96. Her oxygen saturation is 96%.  BMI: Estimated body mass index is 30.04 kg/m as calculated from the following:   Height as of this encounter: 5\' 4"  (1.626 m).   Weight as of this encounter: 175 lb (79.4 kg). Last encounter: 07/11/2023. Last procedure: 06/27/2023.  Reason for encounter: evaluation of worsening, or previously known (established) problem.  Discussed the use of AI scribe software for clinical note transcription with the patient, who gave verbal consent to proceed.  History of Present Illness   The patient presents with severe right leg pain radiating to the knee.  She experiences severe pain in the right leg, particularly when standing and turning, which is intense enough to cause her to scream out. The pain radiates down to the knee, causing the leg to give out, and is primarily located in the front and top of the leg or groin area. It is exacerbated by rotational  movements. No numbness or pain extends  beyond the knee, and the pain is described as different from other types she has experienced, indicating a unique quality to this discomfort.  Additionally, she notes pain during sleep, affecting the sides of the leg. The pain does not extend to the bottom of the right foot. Recently, she has experienced pain in the bottom of the left foot.      Post-procedure evaluation   Type: Hip & bursae injection  R4L3   Laterality: Bilateral (-50)  Bursae: Trochanteric  Laterality: Bilateral (-50)  Approach: Percutaneous posterolateral approach. Level: Lower pelvic and hip joint level.  Imaging: Fluoroscopy-guided Non-spinal ([id number]) Anesthesia: Local anesthesia (1-2% Lidocaine) Anxiolysis: IV Versed 3.0 mg Sedation: Moderate Sedation Fentanyl 1 mL (50 mcg) DOS: 06/27/2023  Performed by: Oswaldo Done, MD  Purpose: Diagnostic/Therapeutic Indications: Hip pain severe enough to impact quality of life or function. Rationale (medical necessity): procedure needed and proper for the diagnosis and/or treatment of [name]'s medical symptoms and needs. 1. Chronic hip pain (Bilateral)   2. Enthesopathy hip region (Left)   3. Greater trochanteric bursitis (Bilateral)   4. Osteoarthritis of hips (Bilateral)    NAS-11 Pain score:   Pre-procedure: 4 /10   Post-procedure: 0-No pain/10       Effectiveness:  Initial hour after procedure: 100 %. Subsequent 4-6 hours post-procedure: 100 %. Analgesia past initial 6 hours: 75 % (lasting approx 2 months). Ongoing improvement:  Analgesic: The patient indicates having attained an ongoing 75% improvement of the hip pain. Function: [name] reports improvement in function ROM: [name] reports improvement in ROM  Pharmacotherapy Assessment  Analgesic: Hydrocodone/APAP 7.5/325 1 tablet p.o. 3 times daily (22.5 mg/day of hydrocodone)  MME/day: 22.5 mg/day.   Monitoring: [place] PMP: PDMP reviewed during  this encounter.       Pharmacotherapy: No side-effects or adverse reactions reported. Compliance: No problems identified. Effectiveness: Clinically acceptable.  Valerie Salts, RN  08/30/2023  1:24 PM  Sign when Signing Visit Safety precautions to be maintained throughout the outpatient stay will include: orient to surroundings, keep bed in low position, maintain call bell within reach at all times, provide assistance with transfer out of bed and ambulation.    No results found for: "CBDTHCR" No results found for: "D8THCCBX" No results found for: "D9THCCBX"  UDS:  Summary  Date Value Ref Range Status  03/22/2023 Note  Final    Comment:    ==================================================================== [employer] (MW) ==================================================================== Test                             Result       Flag       Units  Drug Present and Declared for Prescription Verification   Hydrocodone                    1894         EXPECTED   ng/mg creat   Hydromorphone                  329          EXPECTED   ng/mg creat   Dihydrocodeine                 292          EXPECTED   ng/mg creat   Norhydrocodone                 1758  EXPECTED   ng/mg creat    Sources of hydrocodone include scheduled prescription medications.    Hydromorphone, dihydrocodeine and norhydrocodone are expected    metabolites of hydrocodone. Hydromorphone and dihydrocodeine are    also available as scheduled prescription medications.    Oxymorphone                    335          EXPECTED   ng/mg creat   Noroxymorphone                 134          EXPECTED   ng/mg creat    Sources of oxymorphone are scheduled prescription medications;    oxymorphone is also a metabolite of oxycodone. Noroxymorphone is an    expected metabolite of oxymorphone.  Drug Absent but Declared for Prescription Verification   Oxycodone                      Not Detected UNEXPECTED ng/mg creat     Oxycodone is almost always present in patients taking this drug    consistently.  Absence of oxycodone could be due to lapse of time    since the last dose or unusual pharmacokinetics (rapid metabolism).  ==================================================================== Test                      Result    Flag   Units      Ref Range   Creatinine              65               mg/dL      >=40 ==================================================================== Declared Medications:  The flagging and interpretation on this report are based on the  following declared medications.  Unexpected results may arise from  inaccuracies in the declared medications.   **Note: The testing scope of this panel includes these medications:   Hydrocodone (Norco)  Oxycodone (Percocet)   **Note: The testing scope of this panel does not include the  following reported medications:   Acetaminophen (Norco)  Acetaminophen (Percocet)  Azelastine  Cetirizine (Zyrtec)  Escitalopram (Lexapro)  Fludrocortisone (Florinef)  Fluticasone (Flonase)  Hydrocortisone (Cortef)  Hyoscyamine  Lansoprazole (Prevacid)  Meloxicam (Mobic)  Methenamine  Methylene Blue  Naloxone (Narcan)  Omega-3 Fatty Acids  Pseudoephedrine (Sudafed)  Supplement  Vitamin D2 ==================================================================== For clinical consultation, please call [phone]. ====================================================================       ROS  Constitutional: Denies any fever or chills Gastrointestinal: No reported hemesis, hematochezia, vomiting, or acute GI distress Musculoskeletal: Denies any acute onset joint swelling, redness, loss of ROM, or weakness Neurological: No reported episodes of acute onset apraxia, aphasia, dysarthria, agnosia, amnesia, paralysis, loss of coordination, or loss of consciousness  Medication Review  Azelastine HCl, HYDROcodone-acetaminophen, Urogesic-Blue,  cetirizine, escitalopram, fludrocortisone, fluticasone, hydrocortisone, lansoprazole, meloxicam, naloxone, omega-3 acid ethyl esters, pseudoephedrine, and tiZANidine  History Review  Allergy: [name] is allergic to tramadol, lisinopril, and sulfa antibiotics. Drug: [name]  reports no history of drug use. Alcohol:  reports that she does not currently use alcohol. Tobacco:  reports that she quit smoking about 29 years ago. Her smoking use included cigarettes. She has never used smokeless tobacco. Social: [name]  reports that she quit smoking about 29 years ago. Her smoking use included cigarettes. She has never used smokeless tobacco. She reports that she does not currently use alcohol. She  reports that she does not use drugs. Medical:  has a past medical history of Addison disease (HCC), Arthritis, Back pain, Complication of anesthesia, GERD (gastroesophageal reflux disease), and History of kidney stones. Surgical: [name]  has a past surgical history that includes Cholecystectomy (2010); Polypectomy; dental work; Dilation and curettage of uterus (2010); IR NEPHROSTOMY PLACEMENT LEFT (12/14/2020); Nephrolithotomy (Left, 12/14/2020); Wisdom tooth extraction; Diagnostic laparoscopy (2004); Cystoscopy/ureteroscopy/holmium laser/stent placement (Right, 06/28/2021); Cystoscopy w/ ureteral stent removal (Right, 08/09/2021); Cystoscopy w/ retrogrades (Right, 08/09/2021); Cystoscopy/ureteroscopy/holmium laser/stent placement (Right, 08/09/2021); and Colonoscopy with propofol (N/A, 06/08/2022). Family: family history includes Alcohol abuse in her father; Arthritis in her mother; Brain cancer in her mother; Colon polyps (age of onset: 37) in her maternal grandmother; Hearing loss in her father; High blood pressure in her father and mother; Hyperlipidemia in her father and mother; Hypertension in her mother; Liver cancer in her mother; Lung cancer in her mother.  Laboratory Chemistry Profile    Renal Lab Results  Component Value Date   BUN 22 (H) 09/18/2022   CREATININE 0.72 09/18/2022   BCR 15 06/15/2022   GFR 84.36 08/26/2021   GFRAA 86 08/16/2018   GFRNONAA >60 09/18/2022    Hepatic Lab Results  Component Value Date   AST 36 09/16/2022   ALT 36 09/16/2022   ALBUMIN 4.1 09/16/2022   ALKPHOS 63 09/16/2022   LIPASE 38 09/16/2022    Electrolytes Lab Results  Component Value Date   NA 140 09/18/2022   K 4.6 09/18/2022   CL 108 09/18/2022   CALCIUM 8.7 (L) 09/18/2022   MG 2.2 09/18/2022   PHOS 4.3 09/18/2022    Bone Lab Results  Component Value Date   VD25OH 14.0 (L) 06/15/2022   25OHVITD1 67 08/16/2018   25OHVITD2 <1.0 08/16/2018   25OHVITD3 67 08/16/2018   TESTOSTERONE 1 (L) 08/13/2020    Inflammation (CRP: Acute Phase) (ESR: Chronic Phase) Lab Results  Component Value Date   CRP 2 08/16/2018   ESRSEDRATE 37 (H) 08/16/2018   LATICACIDVEN 1.9 09/16/2022         Note: Above Lab results reviewed.  Recent Imaging Review  DG PAIN CLINIC C-ARM 1-60 MIN NO REPORT Fluoro was used, but no Radiologist interpretation will be provided.  Please refer to "NOTES" tab for provider progress note. Note: Reviewed        Physical Exam  General appearance: Well nourished, well developed, and well hydrated. In no apparent acute distress Mental status: Alert, oriented x 3 (person, place, & time)       Respiratory: No evidence of acute respiratory distress Eyes: PERLA Vitals: BP (!) 142/82   Pulse 96   Temp 97.6 F (36.4 C)   Ht 5\' 4"  (1.626 m)   Wt 175 lb (79.4 kg)   LMP 12/01/2020 (Approximate)   SpO2 96%   BMI 30.04 kg/m  BMI: Estimated body mass index is 30.04 kg/m as calculated from the following:   Height as of this encounter: 5\' 4"  (1.626 m).   Weight as of this encounter: 175 lb (79.4 kg). Ideal: Ideal body weight: 54.7 kg (120 lb 9.5 oz) Adjusted ideal body weight: 64.6 kg (142 lb 5.7 oz)  Physical Exam   MUSCULOSKELETAL: Adequate range of  motion in both legs without bending the knee. Tenderness in the right buttock.     Patrick maneuver was positive for some hip joint discomfort but I was unable to exactly reproduced the patient's right lower extremity pain.  Assessment   Diagnosis Status  1. Lumbar radiculitis (L1/L2) (Right)   2. Lumbosacral radiculopathy at L2 (Right)   3. Chronic hip pain (Bilateral)   4. Lumbar Grade 1 Retrolisthesis (2 mm) of L2/L3   5. L5-S1 pars defect w/ spondylolisthesis (Bilateral)   6. Enthesopathy hip region (Left)   7. Greater trochanteric bursitis (Bilateral)   8. Chronic lower extremity pain (Right)   9. Degeneration of intervertebral disc of lumbosacral region with lower extremity pain   10. Lumbar pars defect (L5-S1) (Bilateral)   11. Spondylolisthesis (8 mm) at L5-S1 level   12. Lumbosacral radiculopathy at L3 (Right)   13. Weakness of lower extremity   14. Postop check    Worsened Worsened Improved   Updated Problems: Problem  Lumbosacral radiculopathy at L2 (Right)  Lumbosacral radiculopathy at L3 (Right)  Weakness of lower extremity     Plan of Care  Problem-specific:  Assessment and Plan    Lumbar Radiculopathy Severe pain in the right buttock radiates to the knee, worsened by rotational movements and standing. Pain occurs during sleep, described as sharp and sudden, causing leg instability. Suspected lumbar radiculopathy, possibly involving L3 or L4 nerve roots. Differential diagnosis includes nerve impingement or lumbar spine instability. Confirm diagnosis with imaging and manage pain with an epidural injection. Discussed risks and benefits of the injection, including potential symptom relief and low risk of complications. Further interventions may be needed based on imaging results. Order lumbar spine x-ray with flexion and extension views. Schedule epidural injection. Consider MRI if symptoms persist or spread.       [name] has a current medication  list which includes the following long-term medication(s): azelastine hcl, escitalopram, flonase sensimist, hydrocodone-acetaminophen, lansoprazole, meloxicam, and omega-3 acid ethyl esters.  Pharmacotherapy (Medications Ordered): No orders of the defined types were placed in this encounter.  Orders:  Orders Placed This Encounter  Procedures   Lumbar Epidural Injection    Standing Status:   Future    Expiration Date:   11/28/2023    Scheduling Instructions:     Procedure: Interlaminar Lumbar Epidural Steroid injection (LESI)  L2-3     Laterality: Right-sided     Sedation: With Sedation.     Timeframe: ASAA    Where will this procedure be performed?:   [facility]   DG Lumbar Spine Complete W/Bend    Patient presents with axial pain with possible radicular component. Please assist [place] in identifying specific level(s) and laterality of any additional findings such as: 1. Facet (Zygapophyseal) joint DJD (Hypertrophy, space narrowing, subchondral sclerosis, and/or osteophyte formation) 2. DDD and/or IVDD (Loss of disc height, desiccation, gas patterns, osteophytes, endplate sclerosis, or "Black disc disease") 3. Pars defects 4. Spondylolisthesis, spondylosis, and/or spondyloarthropathies (include Degree/Grade of displacement in mm) (stability) 5. Vertebral body Fractures (acute/chronic) (state percentage of collapse) 6. Demineralization (osteopenia/osteoporotic) 7. Bone pathology 8. Foraminal narrowing  9. Surgical changes    Standing Status:   Future    Expiration Date:   11/28/2023    Scheduling Instructions:     Please make sure that the patient understands that this needs to be done as soon as possible. Never have the patient do the imaging "just before the next appointment". Inform patient that having the imaging done within the [facility] will expedite the availability of the results and will provide      imaging availability to the requesting physician. In addition inform  the patient that the imaging order has an expiration date and will not be renewed if  not done within the active period.    Reason for Exam (SYMPTOM  OR DIAGNOSIS REQUIRED):   Low back pain    Is patient pregnant?:   No    Preferred imaging location?:   [facility]    Call Results- Best Contact Number?:   [phone] [facility]    Radiology Contrast Protocol - do NOT remove file path:   [email or web address]    Release to patient:   Immediate   Nursing Instructions:    Please complete this patient's postprocedure evaluation.    Scheduling Instructions:     Please complete this patient's postprocedure evaluation.   Follow-up plan:   Return for [facility]): (R) L2-3 LESI #1.      Interventional Therapies  Risk  Complexity Considerations:   WNL   Planned  Pending:   Diagnostic right L2-3 LESI #1    Under consideration:   Therapeutic bilateral lumbar facet medial branch RFA #3    Completed:   Therapeutic bilateral IA hip + bilateral trochanteric bursa inj. R4L3 (06/27/2023) (100/100/70/70)  Therapeutic bilateral lumbar facet MBB x3 (09/08/2020) (4-0) (100/100/60/60)  Therapeutic right lumbar facet RFA x2 (09/09/2021) (4-2) (100/100/90/>75)  Therapeutic left lumbar facet RFA x2 (10/07/2021) (4-0) (100/100/90/>75)  Therapeutic right IA hip inj. x2 (08/20/2020) (5-1) (100/100/80/80)  Therapeutic left IA hip inj. x1 (08/20/2020) (5-1) (100/100/80/80)  Therapeutic bilateral trochanteric bursa inj. x3 (04/07/2022)  (100/100/90/90)    Therapeutic  Palliative (PRN) options:   Palliative lumbar facet MBB   Palliative lumbar facet RFA  Palliative trochanteric bursa inj.    Pharmacotherapy  Nonopioids transferred 05/11/2020: Baclofen and Mobic       Recent Visits Date Type Provider Dept  06/27/23 Procedure visit Delano Metz, MD [facility]  06/19/23 Office Visit Delano Metz, MD [facility]  Showing recent visits within past 90 days and meeting all other requirements Today's Visits Date Type Provider Dept  08/30/23 Office Visit Delano Metz, MD [facility]  Showing today's visits and meeting all other requirements Future Appointments Date Type Provider Dept  09/20/23 Appointment Delano Metz, MD [facility]  Showing future appointments within next 90 days and meeting all other requirements  I discussed the assessment and treatment plan with the patient. The patient was provided an opportunity to ask questions and all were answered. The patient agreed with the plan and demonstrated an understanding of the instructions.  Patient advised to call back or seek an in-person evaluation if the symptoms or condition worsens.  Duration of encounter: 30 minutes.  Total time on encounter, as per AMA guidelines included both the face-to-face and non-face-to-face time personally spent by the physician and/or other qualified health care professional(s) on the day of the encounter (includes time in activities that require the physician or other qualified health care professional and does not include time in activities normally performed by clinical staff). Physician's time may include the following activities when performed: Preparing to see the patient (e.g., pre-charting review of records, searching for previously ordered imaging, lab work, and nerve conduction tests) Review of prior analgesic pharmacotherapies. Reviewing PMP Interpreting ordered tests (e.g., lab work, imaging, nerve conduction tests) Performing post-procedure evaluations, including interpretation of diagnostic procedures Obtaining and/or reviewing separately obtained history Performing a medically appropriate examination and/or evaluation Counseling and educating the patient/family/caregiver Ordering medications, tests, or procedures Referring and  communicating with other health care professionals (when not separately reported) Documenting clinical information in the electronic or other health  record Independently interpreting results (not separately reported) and communicating results to the patient/ family/caregiver Care coordination (not separately reported)  Note by: Oswaldo Done, MD Date: 08/30/2023; Time: 2:15 PM

## 2023-08-30 ENCOUNTER — Encounter: Payer: Self-pay | Admitting: Pain Medicine

## 2023-08-30 ENCOUNTER — Ambulatory Visit
Admission: RE | Admit: 2023-08-30 | Discharge: 2023-08-30 | Disposition: A | Discharge status: Home / Self Care | Payer: BC Managed Care – PPO | Source: Ambulatory Visit | Attending: Pain Medicine | Admitting: Pain Medicine

## 2023-08-30 ENCOUNTER — Ambulatory Visit: Payer: BC Managed Care – PPO | Admitting: Pain Medicine

## 2023-08-30 VITALS — BP 142/82 | HR 96 | Temp 97.6°F | Ht 64.0 in | Wt 175.0 lb

## 2023-08-30 DIAGNOSIS — M4306 Spondylolysis, lumbar region: Secondary | ICD-10-CM

## 2023-08-30 DIAGNOSIS — M79604 Pain in right leg: Secondary | ICD-10-CM | POA: Insufficient documentation

## 2023-08-30 DIAGNOSIS — M7062 Trochanteric bursitis, left hip: Secondary | ICD-10-CM | POA: Insufficient documentation

## 2023-08-30 DIAGNOSIS — G8929 Other chronic pain: Secondary | ICD-10-CM

## 2023-08-30 DIAGNOSIS — Z09 Encounter for follow-up examination after completed treatment for conditions other than malignant neoplasm: Secondary | ICD-10-CM

## 2023-08-30 DIAGNOSIS — R29898 Other symptoms and signs involving the musculoskeletal system: Secondary | ICD-10-CM

## 2023-08-30 DIAGNOSIS — M4317 Spondylolisthesis, lumbosacral region: Secondary | ICD-10-CM

## 2023-08-30 DIAGNOSIS — M4310 Spondylolisthesis, site unspecified: Secondary | ICD-10-CM | POA: Insufficient documentation

## 2023-08-30 DIAGNOSIS — M51371 Other intervertebral disc degeneration, lumbosacral region with lower extremity pain only: Secondary | ICD-10-CM | POA: Insufficient documentation

## 2023-08-30 DIAGNOSIS — M4319 Spondylolisthesis, multiple sites in spine: Secondary | ICD-10-CM | POA: Diagnosis not present

## 2023-08-30 DIAGNOSIS — M25551 Pain in right hip: Secondary | ICD-10-CM | POA: Diagnosis not present

## 2023-08-30 DIAGNOSIS — M5417 Radiculopathy, lumbosacral region: Secondary | ICD-10-CM | POA: Insufficient documentation

## 2023-08-30 DIAGNOSIS — M5416 Radiculopathy, lumbar region: Secondary | ICD-10-CM | POA: Diagnosis not present

## 2023-08-30 DIAGNOSIS — M25552 Pain in left hip: Secondary | ICD-10-CM

## 2023-08-30 DIAGNOSIS — M7061 Trochanteric bursitis, right hip: Secondary | ICD-10-CM | POA: Insufficient documentation

## 2023-08-30 DIAGNOSIS — M5116 Intervertebral disc disorders with radiculopathy, lumbar region: Secondary | ICD-10-CM | POA: Diagnosis not present

## 2023-08-30 DIAGNOSIS — M76892 Other specified enthesopathies of left lower limb, excluding foot: Secondary | ICD-10-CM

## 2023-08-30 DIAGNOSIS — M5117 Intervertebral disc disorders with radiculopathy, lumbosacral region: Secondary | ICD-10-CM | POA: Diagnosis not present

## 2023-08-30 DIAGNOSIS — M4316 Spondylolisthesis, lumbar region: Secondary | ICD-10-CM

## 2023-08-30 DIAGNOSIS — M48061 Spinal stenosis, lumbar region without neurogenic claudication: Secondary | ICD-10-CM | POA: Diagnosis not present

## 2023-08-30 NOTE — Progress Notes (Signed)
Safety precautions to be maintained throughout the outpatient stay will include: orient to surroundings, keep bed in low position, maintain call bell within reach at all times, provide assistance with transfer out of bed and ambulation.

## 2023-08-30 NOTE — Patient Instructions (Signed)

## 2023-09-01 ENCOUNTER — Ambulatory Visit: Payer: BC Managed Care – PPO | Admitting: Internal Medicine

## 2023-09-01 NOTE — Progress Notes (Deleted)
 Name: Denise Burns  MRN/ DOB: [id number], [birth year]    Age/ Sex: 53 y.o., female     PCP: Sandre Kitty, MD   Reason for Endocrinology Evaluation: Addison's Disease     Initial Endocrinology Clinic Visit: 08/06/2018    PATIENT IDENTIFIER: Denise Burns is a 53 y.o., female with a past medical history of Addison's Disease and premature ovarian  failure . She has followed with [facility] since 08/06/2018 for consultative assistance with management of her adrenal insufficiency.   HISTORICAL SUMMARY: The patient was first diagnosed with addison's disease in 2006. She presented with weight loss, hyperpigmentation, and  hypotension. She did not have cosyntropin stim test. She has been on [facility] and Florinef since her diagnosis.   Premature ovarian failure ~ 2017. She did have a postmenopausal bleed between 05/2018- 06/2018. Follows with [facility]    No FH of premature ovarian failure or autoimmune disease.     Testosterone and DHEA-S were not elevated  08/2020-for hair loss    PRE-DIABETES HISTORY :  A1c in 04/2021 was 6.3 %     SUBJECTIVE:    Today (09/01/2023):  Denise Burns is here for a 1 year follow up on adrenal insufficiency. She has been compliant with HC intake.    She continues to receive intra-articular injections of the spine through pain management She sustained right foot metatarsal fracture following a mechanical fall  She follows with urology for renal stones    Denies dizziness, nausea or diarrhea  Denies leg swelling  Denies cramps  except minor feet cramps in the past 2 days     HOME ENDOCRINE MEDICATIONS: Hydrocortisone 15 mg QAM Hydrocortisone 5 mh QPM (between 2-4 pm) Fludrocortisone  0.1 mg , at half a tablet daily        HISTORY:  Past Medical History:  Past Medical History:  Diagnosis Date   Addison disease (HCC)    Arthritis    Back pain    Complication of anesthesia    nausea   GERD (gastroesophageal  reflux disease)    History of kidney stones    Past Surgical History:  Past Surgical History:  Procedure Laterality Date   CHOLECYSTECTOMY  2010   COLONOSCOPY WITH PROPOFOL N/A 06/08/2022   Procedure: COLONOSCOPY WITH PROPOFOL;  Surgeon: Wyline Mood, MD;  Location: [facility];  Service: Gastroenterology;  Laterality: N/A;   CYSTOSCOPY W/ RETROGRADES Right 08/09/2021   Procedure: CYSTOSCOPY WITH RETROGRADE PYELOGRAM;  Surgeon: Vanna Scotland, MD;  Location: [facility] ORS;  Service: Urology;  Laterality: Right;   CYSTOSCOPY W/ URETERAL STENT REMOVAL Right 08/09/2021   Procedure: CYSTOSCOPY WITH STENT REMOVAL;  Surgeon: Vanna Scotland, MD;  Location: [facility] ORS;  Service: Urology;  Laterality: Right;   CYSTOSCOPY/URETEROSCOPY/HOLMIUM LASER/STENT PLACEMENT Right 06/28/2021   Procedure: CYSTOSCOPY/URETEROSCOPY/STENT PLACEMENT;  Surgeon: Vanna Scotland, MD;  Location: [facility] ORS;  Service: Urology;  Laterality: Right;   CYSTOSCOPY/URETEROSCOPY/HOLMIUM LASER/STENT PLACEMENT Right 08/09/2021   Procedure: CYSTOSCOPY/URETEROSCOPY/HOLMIUM LASER/STENT PLACEMENT;  Surgeon: Vanna Scotland, MD;  Location: [facility] ORS;  Service: Urology;  Laterality: Right;   dental work     DIAGNOSTIC LAPAROSCOPY  2004   endometriosis dx   DILATION AND CURETTAGE OF UTERUS  2010   IR NEPHROSTOMY PLACEMENT LEFT  12/14/2020   NEPHROLITHOTOMY Left 12/14/2020   Procedure: NEPHROLITHOTOMY PERCUTANEOUS;  Surgeon: Vanna Scotland, MD;  Location: [facility] ORS;  Service: Urology;  Laterality: Left;   POLYPECTOMY     WISDOM TOOTH EXTRACTION     Social History:  reports that she quit smoking about 29 years ago. Her smoking use included cigarettes. She has never used smokeless tobacco. She reports that she does not currently use alcohol. She reports that she does not use drugs. Family History:  Family History  Problem Relation Age of Onset   Arthritis Mother    Hyperlipidemia Mother    Hypertension Mother    High blood pressure Mother     Lung cancer Mother    Liver cancer Mother    Brain cancer Mother    Alcohol abuse Father    Hearing loss Father    Hyperlipidemia Father    High blood pressure Father    Colon polyps Maternal Grandmother 26   Colon cancer Neg Hx    Esophageal cancer Neg Hx    Stomach cancer Neg Hx    Rectal cancer Neg Hx      HOME MEDICATIONS: Allergies as of 09/01/2023       Reactions   Tramadol Other (See Comments)   Other reaction(s): muscle tension   Lisinopril Rash, Cough   Sulfa Antibiotics Other (See Comments)   Blisters in mouth         Medication List        Accurate as of September 01, 2023 12:45 PM. If you have any questions, ask your nurse or doctor.          Azelastine HCl 137 MCG/SPRAY Soln Place 1 spray into each nostril 2 (two) times daily.   cetirizine 10 MG tablet Commonly known as: ZYRTEC Take 10 mg by mouth daily.   escitalopram 5 MG tablet Commonly known as: LEXAPRO Take 1 tablet (5 mg total) by mouth daily.   Flonase Sensimist 27.5 MCG/SPRAY nasal spray Generic drug: fluticasone Place 1 spray into the nose in the morning and at bedtime.   fludrocortisone 0.1 MG tablet Commonly known as: FLORINEF Take 0.5 tablets (0.05 mg total) by mouth daily.   HYDROcodone-acetaminophen 7.5-325 MG tablet Commonly known as: NORCO Take 1 tablet by mouth every 8 (eight) hours as needed for severe pain (pain score 7-10). Must last 30 days.   hydrocortisone 10 MG tablet Commonly known as: CORTEF Take 1.5 tablets (15 mg total) by mouth with breakfast and 1/2 tablet in the afternoon. (May double up during sick days)   lansoprazole 30 MG capsule Commonly known as: Prevacid Take 1 capsule (30 mg total) by mouth daily at 12 noon.   meloxicam 15 MG tablet Commonly known as: MOBIC Take 1 tablet (15 mg total) by mouth daily.   naloxone 4 MG/0.1ML Liqd nasal spray kit Commonly known as: NARCAN Place 1 spray into the nose as needed for opioid-induced respiratory  depresssion. In case of emergency (overdose), spray once into each nostril. If no response within 3 minutes, repeat application and call 911.   omega-3 acid ethyl esters 1 g capsule Commonly known as: LOVAZA Take 1 g by mouth daily.   pseudoephedrine 30 MG tablet Commonly known as: SUDAFED Take 30 mg by mouth every 12 (twelve) hours as needed for congestion.   tiZANidine 4 MG tablet Commonly known as: ZANAFLEX Take 1 tablet (4 mg total) by mouth every 8 (eight) hours as needed for muscle spasms.   Urogesic-Blue 81.6 MG Tabs Take 1 tablet (81.6 mg total) by mouth 4 (four) times daily as needed.          OBJECTIVE:   PHYSICAL EXAM: VS: LMP 12/01/2020 (Approximate)    EXAM: General: Pt appears well and is in NAD  Neck: General: Supple without adenopathy. Thyroid: Thyroid size normal.    Lungs: Clear with good BS bilat with no rales, rhonchi, or wheezes  Heart: Auscultation: RRR.  Abdomen: Normoactive bowel sounds, soft, nontender, without masses or organomegaly palpable  Extremities:  BL LE: No pretibial edema normal ROM and strength.  Mental Status: Judgment, insight: Intact Orientation: Oriented to time, place, and person Mood and affect: No depression, anxiety, or agitation     DATA REVIEWED:   Latest Reference Range & Units 06/15/22 09:43  Sodium 134 - 144 mmol/L 141  Potassium 3.5 - 5.2 mmol/L 4.1  Chloride 96 - 106 mmol/L 105  CO2 20 - 29 mmol/L 25  Glucose 70 - 99 mg/dL 96  BUN 6 - 24 mg/dL 12  Creatinine 1.30 - 8.65 mg/dL 7.84  Calcium 8.7 - 69.6 mg/dL 9.3  BUN/Creatinine Ratio 9 - 23  15  eGFR >59 mL/min/1.73 87  Alkaline Phosphatase 44 - 121 IU/L 52  Albumin 3.8 - 4.9 g/dL 4.0  Albumin/Globulin Ratio 1.2 - 2.2  1.7  AST 0 - 40 IU/L 24  ALT 0 - 32 IU/L 22  Total Protein 6.0 - 8.5 g/dL 6.4  Total Bilirubin 0.0 - 1.2 mg/dL 1.0    Latest Reference Range & Units 06/15/22 09:43  Glucose 70 - 99 mg/dL 96  Hemoglobin E9B 4.8 - 5.6 % 5.9 (H)  Est.  average glucose Bld gHb Est-mCnc mg/dL 284  TSH 1.324 - 4.010 uIU/mL 2.250  T4,Free(Direct) 0.82 - 1.77 ng/dL 2.72 (L)        ASSESSMENT / PLAN / RECOMMENDATIONS:   1. Primary Adrenal Insufficiency (Addison's Disease)     - Reviewed the sick day rule - She already wears a medical alert bracelet. - BMP normal  - We discussed switching HC to prednisone for ease of taking , but the patient will feels nauseous with prednisone - No changes today    Medications : - Hydrocortisone 15 mg QAM - Hydrocortisone 5 mh QPM (between 2-4 pm) - Continue Fludrocortisone  0.1 mg , at half a tablet daily        F/u in 1 yr    Signed electronically by: Lyndle Herrlich, MD  [facility]  [facility] [address] [place] [place], [place] [address] Phone: [phone] FAX: [phone]      CC: Sandre Kitty, MD [address] [place] [address] Phone: [phone]  Fax: [phone]   Return to Endocrinology clinic as below: Future Appointments  Date Time Provider Department Center  09/01/2023  1:20 PM Allon Costlow, Konrad Dolores, MD [facility] None  09/05/2023 10:40 AM Delano Metz, MD [facility] None  09/06/2023  3:10 PM Sandre Kitty, MD [facility] None  09/20/2023  3:00 PM Delano Metz, MD [facility] None  09/27/2023  3:30 PM Sandre Kitty, MD [facility] None  10/06/2023 10:00 AM Carman Ching, PA-C [facility] None  10/30/2023  4:10 PM Sandre Kitty, MD [facility] None

## 2023-09-05 ENCOUNTER — Ambulatory Visit (HOSPITAL_BASED_OUTPATIENT_CLINIC_OR_DEPARTMENT_OTHER): Payer: BC Managed Care – PPO | Admitting: Pain Medicine

## 2023-09-05 DIAGNOSIS — Z91199 Patient's noncompliance with other medical treatment and regimen due to unspecified reason: Secondary | ICD-10-CM

## 2023-09-05 DIAGNOSIS — Z5189 Encounter for other specified aftercare: Secondary | ICD-10-CM

## 2023-09-05 DIAGNOSIS — M51372 Other intervertebral disc degeneration, lumbosacral region with discogenic back pain and lower extremity pain: Secondary | ICD-10-CM | POA: Insufficient documentation

## 2023-09-05 NOTE — Patient Instructions (Signed)

## 2023-09-05 NOTE — Progress Notes (Signed)
(  09/05/2023) NO-SHOW to procedure encounter.

## 2023-09-06 ENCOUNTER — Ambulatory Visit: Payer: BC Managed Care – PPO | Admitting: Family Medicine

## 2023-09-06 VITALS — BP 128/78 | HR 85 | Ht 64.0 in | Wt 185.0 lb

## 2023-09-06 DIAGNOSIS — M25521 Pain in right elbow: Secondary | ICD-10-CM | POA: Diagnosis not present

## 2023-09-06 DIAGNOSIS — M25522 Pain in left elbow: Secondary | ICD-10-CM

## 2023-09-06 NOTE — Progress Notes (Signed)
   Acute Office Visit  Subjective:     Patient ID: Denise Burns, female    DOB: 02-21-1971, 53 y.o.   MRN: [id number]  Chief Complaint  Patient presents with   Carpal Tunnel    HPI Patient is in today for wrist pain  Patient has been complaining of bilateral elbow pain going back several months.  Describes it as an aching.  Does not really have a burning, pins-and-needles or numbness.  She does have a 10-year history of carpal tunnel syndrome and treats this with over-the-counter medications, wrist splints and it also improves when she gets the low back injections.  He currently manages the dialysis unit.  Prior to that she would do hair transplants.  Patient is a side sleeper.  She does not do any sports or hobbies that would cause strain or stress on her elbows.    ROS      Objective:    BP 128/78   Pulse 85   Ht 5' 4 (1.626 m)   Wt 185 lb (83.9 kg)   LMP 12/01/2020 (Approximate)   SpO2 100%   BMI 31.76 kg/m    Physical Exam General: Alert, oriented Pulmonary: No respiratory stress MSK: No swelling or deformity of the elbows.  Tenderness near the medial epicondyle, right side worse than left.  No results found for any visits on 09/06/23.      Assessment & Plan:   Pain of both elbows Assessment & Plan: Would favor medial epicondylitis, but I cannot say what triggered it or why would be bilateral.  Does not do any excessive repetitive movements or play sports that would cause this.  Cubital fossa syndrome also possibility.  She does have a history of carpal tunnel syndrome.  Advised her she can get a over-the-counter elbow splint to wear at night.  Also showed her exercises and a Thera-Band that she can purchase for additional exercises.  When I follow-up in 2 weeks for her physical if she has done these and symptoms are not better will refer to orthopedics.      No follow-ups on file.  Toribio MARLA Slain, MD

## 2023-09-06 NOTE — Assessment & Plan Note (Signed)
 Would favor medial epicondylitis, but I cannot say what triggered it or why would be bilateral.  Does not do any excessive repetitive movements or play sports that would cause this.  Cubital fossa syndrome also possibility.  She does have a history of carpal tunnel syndrome.  Advised her she can get a over-the-counter elbow splint to wear at night.  Also showed her exercises and a Thera-Band that she can purchase for additional exercises.  When I follow-up in 2 weeks for her physical if she has done these and symptoms are not better will refer to orthopedics.

## 2023-09-06 NOTE — Patient Instructions (Addendum)
 It was nice to see you today,  We addressed the following topics today: -We pain in your elbows is likely to be 1 of 2 things: Medial epicondylitis or cubital tunnel syndrome.  I think it is more likely to be medial epicondylitis but we can repeat both things at the same time. - For cubital tunnel syndrome there is an elbow splint similar to the wrist splint that you have.  You would wear this at night to prevent your elbows from flexing and pinching your nerve. - For medial epicondylitis I have provided some exercises you can do.  Some of these can be made easier with a foam cylinder that you can pick up from a sporting goods store.  Picture is below.  You can also look for videos on YouTube from physical therapist about Thera-Band exercises for medial epicondylitis.   Have a great day,  Rolan Slain, MD

## 2023-09-12 ENCOUNTER — Other Ambulatory Visit: Payer: Self-pay

## 2023-09-12 ENCOUNTER — Other Ambulatory Visit: Payer: BC Managed Care – PPO

## 2023-09-12 DIAGNOSIS — Z0000 Encounter for general adult medical examination without abnormal findings: Secondary | ICD-10-CM

## 2023-09-13 LAB — COMPREHENSIVE METABOLIC PANEL
ALT: 31 [IU]/L (ref 0–32)
AST: 25 [IU]/L (ref 0–40)
Albumin: 4.4 g/dL (ref 3.8–4.9)
Alkaline Phosphatase: 68 [IU]/L (ref 44–121)
BUN/Creatinine Ratio: 20 (ref 9–23)
BUN: 18 mg/dL (ref 6–24)
Bilirubin Total: 0.7 mg/dL (ref 0.0–1.2)
CO2: 24 mmol/L (ref 20–29)
Calcium: 9.5 mg/dL (ref 8.7–10.2)
Chloride: 104 mmol/L (ref 96–106)
Creatinine, Ser: 0.91 mg/dL (ref 0.57–1.00)
Globulin, Total: 2.1 g/dL (ref 1.5–4.5)
Glucose: 154 mg/dL — ABNORMAL HIGH (ref 70–99)
Potassium: 3.9 mmol/L (ref 3.5–5.2)
Sodium: 142 mmol/L (ref 134–144)
Total Protein: 6.5 g/dL (ref 6.0–8.5)
eGFR: 76 mL/min/{1.73_m2} (ref >59)

## 2023-09-13 LAB — CBC WITH DIFFERENTIAL/PLATELET
Basophils Absolute: 0.1 10*3/uL (ref 0.0–0.2)
Basos: 1 %
EOS (ABSOLUTE): 0.4 10*3/uL (ref 0.0–0.4)
Eos: 5 %
Hematocrit: 43.9 % (ref 34.0–46.6)
Hemoglobin: 14.9 g/dL (ref 11.1–15.9)
Immature Grans (Abs): 0 10*3/uL (ref 0.0–0.1)
Immature Granulocytes: 0 %
Lymphocytes Absolute: 3.1 10*3/uL (ref 0.7–3.1)
Lymphs: 39 %
MCH: 31.9 pg (ref 26.6–33.0)
MCHC: 33.9 g/dL (ref 31.5–35.7)
MCV: 94 fL (ref 79–97)
Monocytes Absolute: 0.6 10*3/uL (ref 0.1–0.9)
Monocytes: 8 %
Neutrophils Absolute: 3.7 10*3/uL (ref 1.4–7.0)
Neutrophils: 47 %
Platelets: 208 10*3/uL (ref 150–450)
RBC: 4.67 x10E6/uL (ref 3.77–5.28)
RDW: 12.8 % (ref 11.7–15.4)
WBC: 7.9 10*3/uL (ref 3.4–10.8)

## 2023-09-13 LAB — LIPID PANEL
Chol/HDL Ratio: 5.1 {ratio} — ABNORMAL HIGH (ref 0.0–4.4)
Cholesterol, Total: 280 mg/dL — ABNORMAL HIGH (ref 100–199)
HDL: 55 mg/dL (ref >39)
LDL Chol Calc (NIH): 192 mg/dL — ABNORMAL HIGH (ref 0–99)
Triglycerides: 175 mg/dL — ABNORMAL HIGH (ref 0–149)
VLDL Cholesterol Cal: 33 mg/dL (ref 5–40)

## 2023-09-13 LAB — TSH: TSH: 1.72 u[IU]/mL (ref 0.450–4.500)

## 2023-09-13 LAB — HEMOGLOBIN A1C
Est. average glucose Bld gHb Est-mCnc: 128 mg/dL
Hgb A1c MFr Bld: 6.1 % — ABNORMAL HIGH (ref 4.8–5.6)

## 2023-09-13 NOTE — Progress Notes (Unsigned)
PROVIDER NOTE: Interpretation of information contained herein should be left to medically-trained personnel. Specific patient instructions are provided elsewhere under "Patient Instructions" section of medical record. This document was created in part using STT-dictation technology, any transcriptional errors that may result from this process are unintentional.  Patient: Denise Burns Type: Established DOB: [birth year] MRN: [id number] PCP: Sandre Kitty, MD  Service: Procedure DOS: 09/14/2023 Setting: Ambulatory Location: Ambulatory outpatient facility Delivery: Face-to-face Provider: Oswaldo Done, MD Specialty: Interventional Pain Management Specialty designation: 09 Location: Outpatient facility Ref. Prov.: Sandre Kitty, MD       Interventional Therapy   Type: Lumbar epidural steroid injection (LESI) (interlaminar) #1    Laterality: Right   Level:  L2-3 Level.  Imaging: Fluoroscopic guidance         Anesthesia: Local anesthesia (1-2% Lidocaine) Anxiolysis: None                 Sedation: No Sedation                       DOS: 09/14/2023  Performed by: Oswaldo Done, MD  Purpose: Diagnostic/Therapeutic Indications: Lumbar radicular pain of intraspinal etiology of more than 4 weeks that has failed to respond to conservative therapy and is severe enough to impact quality of life or function. 1. Chronic low back pain (1ry area of Pain) (Bilateral) (R>L) w/o sciatica   2. Chronic lower extremity pain (Right)   3. DDD (degenerative disc disease), lumbosacral w/ LBP and LEP   4. Lumbar Grade 1 Retrolisthesis (2 mm) of L2/L3   5. Lumbar foraminal stenosis (Left: L2-3) (Bilateral: L5-S1)   6. Lumbar radiculitis (L1/L2) (Right)   7. Lumbosacral radiculopathy at L2 (Right)   8. Lumbosacral radiculopathy at L3 (Right)    NAS-11 Pain score:   Pre-procedure: 3 /10   Post-procedure: 0-No pain/10      Position / Prep / Materials:  Position: Prone w/ head of the table  raised (slight reverse trendelenburg) to facilitate breathing.  Prep solution: ChloraPrep (2% chlorhexidine gluconate and 70% isopropyl alcohol) Prep Area: Entire Posterior Lumbar Region from lower scapular tip down to mid buttocks area and from flank to flank. Materials:  Tray: Epidural tray Needle(s):  Type: Epidural needle (Tuohy) Gauge (G):  17 Length: Regular (3.5-in) Qty: 1   H&P (Pre-op Assessment):  Denise Burns is a 53 y.o. (year old), female patient, seen today for interventional treatment. She  has a past surgical history that includes Cholecystectomy (2010); Polypectomy; dental work; Dilation and curettage of uterus (2010); IR NEPHROSTOMY PLACEMENT LEFT (12/14/2020); Nephrolithotomy (Left, 12/14/2020); Wisdom tooth extraction; Diagnostic laparoscopy (2004); Cystoscopy/ureteroscopy/holmium laser/stent placement (Right, 06/28/2021); Cystoscopy w/ ureteral stent removal (Right, 08/09/2021); Cystoscopy w/ retrogrades (Right, 08/09/2021); Cystoscopy/ureteroscopy/holmium laser/stent placement (Right, 08/09/2021); and Colonoscopy with propofol (N/A, 06/08/2022). Ms. Grothaus has a current medication list which includes the following prescription(s): azelastine hcl, cetirizine, escitalopram, fludrocortisone, flonase sensimist, hydrocodone-acetaminophen, hydrocortisone, lansoprazole, meloxicam, urogesic-blue, naloxone, omega-3 acid ethyl esters, pseudoephedrine, and tizanidine. Her primarily concern today is the Back Pain  Initial Vital Signs:  Pulse/HCG Rate: 87ECG Heart Rate: 83 Temp: (!) 97.5 F (36.4 C) Resp: 16 BP: (!) 133/94 SpO2: 99 %  BMI: Estimated body mass index is 30.04 kg/m as calculated from the following:   Height as of this encounter: 5\' 4"  (1.626 m).   Weight as of this encounter: 175 lb (79.4 kg).  Risk Assessment: Allergies: Reviewed. She is allergic to tramadol, lisinopril, and sulfa antibiotics.  Allergy Precautions: None  required Coagulopathies: Reviewed. None  identified.  Blood-thinner therapy: None at this time Active Infection(s): Reviewed. None identified. Denise Burns is afebrile  Site Confirmation: Denise Burns was asked to confirm the procedure and laterality before marking the site Procedure checklist: Completed Consent: Before the procedure and under the influence of no sedative(s), amnesic(s), or anxiolytics, the patient was informed of the treatment options, risks and possible complications. To fulfill our ethical and legal obligations, as recommended by the American Medical Association's Code of Ethics, I have informed the patient of my clinical impression; the nature and purpose of the treatment or procedure; the risks, benefits, and possible complications of the intervention; the alternatives, including doing nothing; the risk(s) and benefit(s) of the alternative treatment(s) or procedure(s); and the risk(s) and benefit(s) of doing nothing. The patient was provided information about the general risks and possible complications associated with the procedure. These may include, but are not limited to: failure to achieve desired goals, infection, bleeding, organ or nerve damage, allergic reactions, paralysis, and death. In addition, the patient was informed of those risks and complications associated to Spine-related procedures, such as failure to decrease pain; infection (i.e.: Meningitis, epidural or intraspinal abscess); bleeding (i.e.: epidural hematoma, subarachnoid hemorrhage, or any other type of intraspinal or peri-dural bleeding); organ or nerve damage (i.e.: Any type of peripheral nerve, nerve root, or spinal cord injury) with subsequent damage to sensory, motor, and/or autonomic systems, resulting in permanent pain, numbness, and/or weakness of one or several areas of the body; allergic reactions; (i.e.: anaphylactic reaction); and/or death. Furthermore, the patient was informed of those risks and complications associated with the  medications. These include, but are not limited to: allergic reactions (i.e.: anaphylactic or anaphylactoid reaction(s)); adrenal axis suppression; blood sugar elevation that in diabetics may result in ketoacidosis or comma; water retention that in patients with history of congestive heart failure may result in shortness of breath, pulmonary edema, and decompensation with resultant heart failure; weight gain; swelling or edema; medication-induced neural toxicity; particulate matter embolism and blood vessel occlusion with resultant organ, and/or nervous system infarction; and/or aseptic necrosis of one or more joints. Finally, the patient was informed that Medicine is not an exact science; therefore, there is also the possibility of unforeseen or unpredictable risks and/or possible complications that may result in a catastrophic outcome. The patient indicated having understood very clearly. We have given the patient no guarantees and we have made no promises. Enough time was given to the patient to ask questions, all of which were answered to the patient's satisfaction. Ms. Tortorelli has indicated that she wanted to continue with the procedure. Attestation: I, the ordering provider, attest that I have discussed with the patient the benefits, risks, side-effects, alternatives, likelihood of achieving goals, and potential problems during recovery for the procedure that I have provided informed consent. Date  Time: 09/14/2023 10:32 AM  Pre-Procedure Preparation:  Monitoring: As per clinic protocol. Respiration, ETCO2, SpO2, BP, heart rate and rhythm monitor placed and checked for adequate function Safety Precautions: Patient was assessed for positional comfort and pressure points before starting the procedure. Time-out: I initiated and conducted the "Time-out" before starting the procedure, as per protocol. The patient was asked to participate by confirming the accuracy of the "Time Out" information.  Verification of the correct person, site, and procedure were performed and confirmed by me, the nursing staff, and the patient. "Time-out" conducted as per Joint Commission's Universal Protocol (UP.01.01.01). Time: 1052 Start Time: 1052 hrs.  Description/Narrative of Procedure:  Target: Epidural space via interlaminar opening, initially targeting the lower laminar border of the superior vertebral body. Region: Lumbar Approach: Percutaneous paravertebral  Rationale (medical necessity): procedure needed and proper for the diagnosis and/or treatment of the patient's medical symptoms and needs. Procedural Technique Safety Precautions: Aspiration looking for blood return was conducted prior to all injections. At no point did we inject any substances, as a needle was being advanced. No attempts were made at seeking any paresthesias. Safe injection practices and needle disposal techniques used. Medications properly checked for expiration dates. SDV (single dose vial) medications used. Description of the Procedure: Protocol guidelines were followed. The procedure needle was introduced through the skin, ipsilateral to the reported pain, and advanced to the target area. Bone was contacted and the needle walked caudad, until the lamina was cleared. The epidural space was identified using "loss-of-resistance technique" with 2-3 ml of PF-NaCl (0.9% NSS), in a 5cc LOR glass syringe.  Vitals:   09/14/23 1050 09/14/23 1055 09/14/23 1100 09/14/23 1101  BP: (!) 152/100 (!) 149/105 (!) 160/98 (!) 160/99  Pulse:      Resp: 13 20 14 13   Temp:      SpO2: 100% 99% 100% 98%  Weight:      Height:        Start Time: 1052 hrs. End Time: 1059 hrs.  Imaging Guidance (Spinal):          Type of Imaging Technique: Fluoroscopy Guidance (Spinal) Indication(s): Fluoroscopy guidance for needle placement to enhance accuracy in procedures requiring precise needle localization for targeted delivery of medication in or  near specific anatomical locations not easily accessible without such real-time imaging assistance. Exposure Time: Please see nurses notes. Contrast: Before injecting any contrast, we confirmed that the patient did not have an allergy to iodine, shellfish, or radiological contrast. Once satisfactory needle placement was completed at the desired level, radiological contrast was injected. Contrast injected under live fluoroscopy. No contrast complications. See chart for type and volume of contrast used. Fluoroscopic Guidance: I was personally present during the use of fluoroscopy. "Tunnel Vision Technique" used to obtain the best possible view of the target area. Parallax error corrected before commencing the procedure. "Direction-depth-direction" technique used to introduce the needle under continuous pulsed fluoroscopy. Once target was reached, antero-posterior, oblique, and lateral fluoroscopic projection used confirm needle placement in all planes. Images permanently stored in EMR. Interpretation: I personally interpreted the imaging intraoperatively. Adequate needle placement confirmed in multiple planes. Appropriate spread of contrast into desired area was observed. No evidence of afferent or efferent intravascular uptake. No intrathecal or subarachnoid spread observed. Permanent images saved into the patient's record.  Antibiotic Prophylaxis:   Anti-infectives (From admission, onward)    None      Indication(s): None identified  Post-operative Assessment:  Post-procedure Vital Signs:  Pulse/HCG Rate: 8775 Temp: (!) 97.5 F (36.4 C) Resp: 13 BP: (!) 160/99 SpO2: 98 %  EBL: None  Complications: No immediate post-treatment complications observed by team, or reported by patient.  Note: The patient tolerated the entire procedure well. A repeat set of vitals were taken after the procedure and the patient was kept under observation following institutional policy, for this type of  procedure. Post-procedural neurological assessment was performed, showing return to baseline, prior to discharge. The patient was provided with post-procedure discharge instructions, including a section on how to identify potential problems. Should any problems arise concerning this procedure, the patient was given instructions to immediately contact us, at any time, without hesitation. In any  case, we plan to contact the patient by telephone for a follow-up status report regarding this interventional procedure.  Comments:  No additional relevant information.  Plan of Care (POC)  Orders:  Orders Placed This Encounter  Procedures   Lumbar Epidural Injection    Scheduling Instructions:     Procedure: Interlaminar LESI L2-3     Laterality: Right     Sedation: Patient's choice     Timeframe: Today    Where will this procedure be performed?:   [facility]   [facility] C-ARM 1-60 MIN NO REPORT    Intraoperative interpretation by procedural physician at [facility].    Standing Status:   Standing    Number of Occurrences:   1    Reason for exam::   Assistance in needle guidance and placement for procedures requiring needle placement in or near specific anatomical locations not easily accessible without such assistance.   Informed Consent Details: Physician/Practitioner Attestation; Transcribe to consent form and obtain patient signature    Note: Always confirm laterality of pain with Ms. Lula Olszewski, before procedure. Transcribe to consent form and obtain patient signature.    Physician/Practitioner attestation of informed consent for procedure/surgical case:   I, the physician/practitioner, attest that I have discussed with the patient the benefits, risks, side effects, alternatives, likelihood of achieving goals and potential problems during recovery for the procedure that I have provided informed consent.    Procedure:   Lumbar epidural steroid injection under fluoroscopic  guidance    Physician/Practitioner performing the procedure:   Miryam Mcelhinney A. Laban Emperor, MD    Indication/Reason:   Low back and/or lower extremity pain secondary to lumbar radiculitis   Provide equipment / supplies at bedside    Procedural tray: Epidural Tray (Disposable  single use) Skin infiltration needle: Regular 1.5-in, 25-G, (x1) Block needle size: Regular standard Catheter: No catheter required    Standing Status:   Standing    Number of Occurrences:   1    Specify:   Epidural Tray   Chronic Opioid Analgesic:   Hydrocodone/APAP 7.5/325 1 tablet p.o. 3 times daily (22.5 mg/day of hydrocodone)  MME/day: 22.5 mg/day.   Medications ordered for procedure: Meds ordered this encounter  Medications   iohexol (OMNIPAQUE) 180 MG/ML injection 10 mL    Must be Myelogram-compatible. If not available, you may substitute with a water-soluble, non-ionic, hypoallergenic, myelogram-compatible radiological contrast medium.   lidocaine (XYLOCAINE) 2 % (with pres) injection 400 mg   pentafluoroprop-tetrafluoroeth (GEBAUERS) aerosol   DISCONTD: midazolam (VERSED) injection 0.5-2 mg    Make sure Flumazenil is available in the pyxis when using this medication. If oversedation occurs, administer 0.2 mg IV over 15 sec. If after 45 sec no response, administer 0.2 mg again over 1 min; may repeat at 1 min intervals; not to exceed 4 doses (1 mg)   sodium chloride flush (NS) 0.9 % injection 2 mL   ropivacaine (PF) 2 mg/mL (0.2%) (NAROPIN) injection 2 mL   triamcinolone acetonide (KENALOG-40) injection 40 mg   Medications administered: We administered iohexol, lidocaine, pentafluoroprop-tetrafluoroeth, sodium chloride flush, ropivacaine (PF) 2 mg/mL (0.2%), and triamcinolone acetonide.  See the medical record for exact dosing, route, and time of administration.  Follow-up plan:   Return in about 2 weeks (around 09/28/2023) for (Face2F), (PPE).       Interventional Therapies  Risk  Complexity  Considerations:   WNL   Planned  Pending:   Diagnostic right L2-3 LESI #1    Under consideration:  Therapeutic bilateral lumbar facet medial branch RFA #3    Completed:   Therapeutic bilateral IA hip + bilateral trochanteric bursa inj. R4L3 (06/27/2023) (100/100/70/70)  Therapeutic bilateral lumbar facet MBB x3 (09/08/2020) (4-0) (100/100/60/60)  Therapeutic right lumbar facet RFA x2 (09/09/2021) (4-2) (100/100/90/>75)  Therapeutic left lumbar facet RFA x2 (10/07/2021) (4-0) (100/100/90/>75)  Therapeutic right IA hip inj. x2 (08/20/2020) (5-1) (100/100/80/80)  Therapeutic left IA hip inj. x1 (08/20/2020) (5-1) (100/100/80/80)  Therapeutic bilateral trochanteric bursa inj. x3 (04/07/2022)  (100/100/90/90)    Therapeutic  Palliative (PRN) options:   Palliative lumbar facet MBB   Palliative lumbar facet RFA  Palliative trochanteric bursa inj.    Pharmacotherapy  Nonopioids transferred 05/11/2020: Baclofen and Mobic       Recent Visits Date Type Provider Dept  08/30/23 Office Visit Delano Metz, MD [facility]  06/27/23 Procedure visit Delano Metz, MD [facility]  06/19/23 Office Visit Delano Metz, MD [facility]  Showing recent visits within past 90 days and meeting all other requirements Today's Visits Date Type Provider Dept  09/14/23 Procedure visit Delano Metz, MD [facility]  Showing today's visits and meeting all other requirements Future Appointments Date Type Provider Dept  09/20/23 Appointment Delano Metz, MD [facility]  10/10/23 Appointment Delano Metz, MD [facility]  Showing future appointments within next 90 days and meeting all other requirements  Disposition: Discharge home  Discharge (Date  Time): 09/14/2023; 1105 hrs.   Primary Care Physician: Sandre Kitty, MD Location: [facility] Note by: Oswaldo Done, MD (TTS  technology used. I apologize for any typographical errors that were not detected and corrected.) Date: 09/14/2023; Time: 11:07 AM  Disclaimer:  Medicine is not an Visual merchandiser. The only guarantee in medicine is that nothing is guaranteed. It is important to note that the decision to proceed with this intervention was based on the information collected from the patient. The Data and conclusions were drawn from the patient's questionnaire, the interview, and the physical examination. Because the information was provided in large part by the patient, it cannot be guaranteed that it has not been purposely or unconsciously manipulated. Every effort has been made to obtain as much relevant data as possible for this evaluation. It is important to note that the conclusions that lead to this procedure are derived in large part from the available data. Always take into account that the treatment will also be dependent on availability of resources and existing treatment guidelines, considered by other Pain Management Practitioners as being common knowledge and practice, at the time of the intervention. For Medico-Legal purposes, it is also important to point out that variation in procedural techniques and pharmacological choices are the acceptable norm. The indications, contraindications, technique, and results of the above procedure should only be interpreted and judged by a Board-Certified Interventional Pain Specialist with extensive familiarity and expertise in the same exact procedure and technique.

## 2023-09-13 NOTE — Patient Instructions (Signed)

## 2023-09-14 ENCOUNTER — Ambulatory Visit: Payer: BC Managed Care – PPO | Attending: Pain Medicine | Admitting: Pain Medicine

## 2023-09-14 ENCOUNTER — Ambulatory Visit
Admission: RE | Admit: 2023-09-14 | Discharge: 2023-09-14 | Disposition: A | Discharge status: Home / Self Care | Payer: BC Managed Care – PPO | Source: Ambulatory Visit | Attending: Pain Medicine | Admitting: Pain Medicine

## 2023-09-14 VITALS — BP 160/99 | HR 87 | Temp 97.5°F | Resp 13 | Ht 64.0 in | Wt 175.0 lb

## 2023-09-14 DIAGNOSIS — M79604 Pain in right leg: Secondary | ICD-10-CM | POA: Diagnosis not present

## 2023-09-14 DIAGNOSIS — M545 Low back pain, unspecified: Secondary | ICD-10-CM | POA: Insufficient documentation

## 2023-09-14 DIAGNOSIS — M5416 Radiculopathy, lumbar region: Secondary | ICD-10-CM | POA: Diagnosis not present

## 2023-09-14 DIAGNOSIS — M5417 Radiculopathy, lumbosacral region: Secondary | ICD-10-CM

## 2023-09-14 DIAGNOSIS — M4306 Spondylolysis, lumbar region: Secondary | ICD-10-CM

## 2023-09-14 DIAGNOSIS — G8929 Other chronic pain: Secondary | ICD-10-CM

## 2023-09-14 DIAGNOSIS — M4310 Spondylolisthesis, site unspecified: Secondary | ICD-10-CM | POA: Diagnosis not present

## 2023-09-14 DIAGNOSIS — M48061 Spinal stenosis, lumbar region without neurogenic claudication: Secondary | ICD-10-CM | POA: Diagnosis not present

## 2023-09-14 DIAGNOSIS — R29898 Other symptoms and signs involving the musculoskeletal system: Secondary | ICD-10-CM | POA: Diagnosis not present

## 2023-09-14 DIAGNOSIS — M4317 Spondylolisthesis, lumbosacral region: Secondary | ICD-10-CM | POA: Diagnosis not present

## 2023-09-14 DIAGNOSIS — M51371 Other intervertebral disc degeneration, lumbosacral region with lower extremity pain only: Secondary | ICD-10-CM

## 2023-09-14 DIAGNOSIS — M51372 Other intervertebral disc degeneration, lumbosacral region with discogenic back pain and lower extremity pain: Secondary | ICD-10-CM

## 2023-09-14 MED ORDER — LIDOCAINE HCL 2 % IJ SOLN
20.0000 mL | Freq: Once | INTRAMUSCULAR | Status: AC
Start: 2023-09-14 — End: 2023-09-14
  Administered 2023-09-14: 400 mg
  Filled 2023-09-14: qty 20

## 2023-09-14 MED ORDER — ROPIVACAINE HCL 2 MG/ML IJ SOLN
2.0000 mL | Freq: Once | INTRAMUSCULAR | Status: AC
  Administered 2023-09-14: 2 mL via EPIDURAL
  Filled 2023-09-14: qty 20

## 2023-09-14 MED ORDER — MIDAZOLAM HCL 2 MG/2ML IJ SOLN: 0.5000 mg | Freq: Once | INTRAMUSCULAR | Status: DC

## 2023-09-14 MED ORDER — PENTAFLUOROPROP-TETRAFLUOROETH EX AERO
INHALATION_SPRAY | Freq: Once | CUTANEOUS | Status: AC
Start: 2023-09-14 — End: 2023-09-14
  Administered 2023-09-14: 30 via TOPICAL

## 2023-09-14 MED ORDER — IOHEXOL 180 MG/ML  SOLN
10.0000 mL | Freq: Once | INTRAMUSCULAR | Status: AC
Start: 2023-09-14 — End: 2023-09-14
  Administered 2023-09-14: 10 mL via EPIDURAL
  Filled 2023-09-14: qty 20

## 2023-09-14 MED ORDER — TRIAMCINOLONE ACETONIDE 40 MG/ML IJ SUSP
40.0000 mg | Freq: Once | INTRAMUSCULAR | Status: AC
Start: 2023-09-14 — End: 2023-09-14
  Administered 2023-09-14: 40 mg
  Filled 2023-09-14: qty 1

## 2023-09-14 MED ORDER — SODIUM CHLORIDE (PF) 0.9 % IJ SOLN
INTRAMUSCULAR | Status: AC
  Filled 2023-09-14: qty 10

## 2023-09-14 MED ORDER — SODIUM CHLORIDE 0.9% FLUSH
2.0000 mL | Freq: Once | INTRAVENOUS | Status: AC
  Administered 2023-09-14: 2 mL

## 2023-09-15 ENCOUNTER — Telehealth: Payer: Self-pay | Admitting: *Deleted

## 2023-09-15 NOTE — Telephone Encounter (Signed)
Post procedure call; voicemail left

## 2023-09-20 ENCOUNTER — Other Ambulatory Visit: Payer: Self-pay

## 2023-09-20 ENCOUNTER — Encounter: Payer: Self-pay | Admitting: Pain Medicine

## 2023-09-20 ENCOUNTER — Ambulatory Visit: Payer: BC Managed Care – PPO | Attending: Pain Medicine | Admitting: Pain Medicine

## 2023-09-20 VITALS — BP 145/89 | HR 94 | Temp 97.6°F | Resp 16 | Ht 64.0 in | Wt 175.0 lb

## 2023-09-20 DIAGNOSIS — M4310 Spondylolisthesis, site unspecified: Secondary | ICD-10-CM | POA: Insufficient documentation

## 2023-09-20 DIAGNOSIS — M5416 Radiculopathy, lumbar region: Secondary | ICD-10-CM | POA: Insufficient documentation

## 2023-09-20 DIAGNOSIS — G8929 Other chronic pain: Secondary | ICD-10-CM | POA: Insufficient documentation

## 2023-09-20 DIAGNOSIS — Z09 Encounter for follow-up examination after completed treatment for conditions other than malignant neoplasm: Secondary | ICD-10-CM | POA: Diagnosis not present

## 2023-09-20 DIAGNOSIS — M25552 Pain in left hip: Secondary | ICD-10-CM | POA: Insufficient documentation

## 2023-09-20 DIAGNOSIS — M25551 Pain in right hip: Secondary | ICD-10-CM | POA: Insufficient documentation

## 2023-09-20 DIAGNOSIS — M5417 Radiculopathy, lumbosacral region: Secondary | ICD-10-CM | POA: Insufficient documentation

## 2023-09-20 DIAGNOSIS — M533 Sacrococcygeal disorders, not elsewhere classified: Secondary | ICD-10-CM | POA: Diagnosis not present

## 2023-09-20 DIAGNOSIS — Z79891 Long term (current) use of opiate analgesic: Secondary | ICD-10-CM | POA: Diagnosis not present

## 2023-09-20 DIAGNOSIS — G894 Chronic pain syndrome: Secondary | ICD-10-CM | POA: Diagnosis not present

## 2023-09-20 DIAGNOSIS — M79604 Pain in right leg: Secondary | ICD-10-CM | POA: Insufficient documentation

## 2023-09-20 DIAGNOSIS — M4316 Spondylolisthesis, lumbar region: Secondary | ICD-10-CM | POA: Diagnosis not present

## 2023-09-20 DIAGNOSIS — M47816 Spondylosis without myelopathy or radiculopathy, lumbar region: Secondary | ICD-10-CM | POA: Insufficient documentation

## 2023-09-20 DIAGNOSIS — M4726 Other spondylosis with radiculopathy, lumbar region: Secondary | ICD-10-CM

## 2023-09-20 DIAGNOSIS — Z79899 Other long term (current) drug therapy: Secondary | ICD-10-CM | POA: Insufficient documentation

## 2023-09-20 DIAGNOSIS — M4317 Spondylolisthesis, lumbosacral region: Secondary | ICD-10-CM | POA: Diagnosis not present

## 2023-09-20 DIAGNOSIS — M545 Low back pain, unspecified: Secondary | ICD-10-CM | POA: Insufficient documentation

## 2023-09-20 MED ORDER — HYDROCODONE-ACETAMINOPHEN 7.5-325 MG PO TABS
1.0000 | ORAL_TABLET | Freq: Three times a day (TID) | ORAL | 0 refills | Status: DC | PRN
  Filled 2023-10-25: qty 90, 30d supply, fill #0

## 2023-09-20 MED ORDER — HYDROCODONE-ACETAMINOPHEN 7.5-325 MG PO TABS
1.0000 | ORAL_TABLET | Freq: Three times a day (TID) | ORAL | 0 refills | Status: DC | PRN
  Filled 2023-09-25: qty 90, 30d supply, fill #0

## 2023-09-20 MED ORDER — HYDROCODONE-ACETAMINOPHEN 7.5-325 MG PO TABS
1.0000 | ORAL_TABLET | Freq: Three times a day (TID) | ORAL | 0 refills | Status: DC | PRN
  Filled 2023-11-24: qty 90, 30d supply, fill #0

## 2023-09-20 NOTE — Progress Notes (Signed)
PROVIDER NOTE: Information contained herein reflects review and annotations entered in association with encounter. Interpretation of such information and data should be left to medically-trained personnel. Information provided to patient can be located elsewhere in the medical record under "Patient Instructions". Document created using STT-dictation technology, any transcriptional errors that may result from process are unintentional.    Patient: Denise Burns  Service Category: E/M  Provider: Oswaldo Done, MD  DOB: 31-May-1971  DOS: 09/20/2023  Referring Provider: Sandre Kitty, MD  MRN: [id number]  Specialty: Interventional Pain Management  PCP: Sandre Kitty, MD  Type: Established Patient  Setting: Ambulatory outpatient    Location: Office  Delivery: Face-to-face     HPI  Denise Burns, a 53 y.o. year old female, is here today because of her Chronic bilateral low back pain without sciatica [M54.50, G89.29]. Denise Burns's primary complain today is Back Pain  Pertinent problems: Denise Burns has Chronic midline low back pain with bilateral sciatica; Chronic low back pain (1ry area of Pain) (Bilateral) (R>L) w/o sciatica; Chronic sacroiliac joint pain (2ry area of Pain) (Right); Chronic pain syndrome; Abnormal MRI, lumbar spine (03/22/2018); Lumbar facet hypertrophy (Multilevel) (Bilateral); Lumbar facet syndrome (Bilateral); Spondylolisthesis (8 mm) at L5-S1 level; Lumbar Grade 1 Retrolisthesis (2 mm) of L2/L3; L5-S1 pars defect w/ spondylolisthesis (Bilateral); Lumbar pars defect (L5-S1) (Bilateral); Lumbar foraminal stenosis (Left: L2-3) (Bilateral: L5-S1); Osteoarthritis of sacroiliac joint (Bilateral); Chronic musculoskeletal pain; Neurogenic pain; Osteoarthritis of facet joint of lumbar spine; Greater trochanteric bursitis (Bilateral); Lumbar spondylosis; Spondylosis without myelopathy or radiculopathy, lumbosacral region; Chronic hip pain (Right); Osteoarthritis of hip  (Right); Lumbar radiculitis (L1/L2) (Right); Chronic groin pain (Right); Chronic hip pain (Bilateral); Enthesopathy hip region (Left); Osteoarthritis of hips (Bilateral); Kissing spine syndrome; Chronic low back pain (Midline) w/o sciatica; Trigger point with back pain (PSIS area) (Right); Lumbar facet joint pain; Broken toes (Right); Closed displaced fracture of distal phalanx of lesser toe of right foot; Chronic lower extremity pain (Right); Lumbosacral radiculopathy at L2 (Right); Lumbosacral radiculopathy at L3 (Right); Weakness of lower extremity; DDD (degenerative disc disease), lumbosacral w/ LBP and LEP; Pain of both elbows; Lumbosacral Anterolisthesis of L5/S1 (Dynamic) (N/E:23mm  F:54mm); Multifactorial low back pain; and Low back pain of over 3 months duration on their pertinent problem list. Pain Assessment: Severity of Chronic pain is reported as a 2 /10. Location: Back Lower/denies. Onset: More than a month ago. Quality: Aching. Timing: Constant. Modifying factor(s): procedures. Vitals:  height is 5\' 4"  (1.626 m) and weight is 175 lb (79.4 kg). Her temperature is 97.6 F (36.4 C). Her blood pressure is 145/89 (abnormal) and her pulse is 94. Her respiration is 16 and oxygen saturation is 99%.  BMI: Estimated body mass index is 30.04 kg/m as calculated from the following:   Height as of this encounter: 5\' 4"  (1.626 m).   Weight as of this encounter: 175 lb (79.4 kg). Last encounter: 08/30/2023. Last procedure: 09/14/2023.  Reason for encounter: post-procedure evaluation and assessment.  The patient indicates doing well with the current medication regimen. No adverse reactions or side effects reported to the medications.   Diagnostic x-rays of the lumbar spine with bending views (08/30/2023) IMPRESSION: 1. Anterolisthesis of L5 on S1 with prominent degenerative disc disease and facet hypertrophy. Anterolisthesis measures 11 mm on neutral and extension, 14 mm on flexion. The known pars defects  on prior exam are not well demonstrated by radiograph. 2. Mild degenerative disc disease at L3-4 and L4-5.  Discussed the use  of AI scribe software for clinical note transcription with the patient, who gave verbal consent to proceed.  History of Present Illness   Denise Burns is a 53 year old female with lumbar spondylolisthesis who presents for follow-up after an L2-3 lumbar epidural steroid injection.  Following the L2-3 lumbar epidural steroid injection, she experienced significant pain relief, with approximately 75% improvement. The pain occasionally recurs slightly when she turns, but it does not radiate down her leg nor cause severe discomfort. She describes the sensation as something she 'just notices' rather than being debilitating.  Her past medical history includes a congenital L5 pars defect, resulting in some movement at that level. She has a history of anterolisthesis at L5-S1 and retrolisthesis at L2-3. The most recent x-ray showed an increase in the anterolisthesis at L5-S1 from 8 millimeters to 11 millimeters in neutral position and extension, and 14 millimeters on flexion, indicating some dynamic movement. The retrolisthesis at L2-3 was previously measured at 2 millimeters but was not mentioned in the latest x-ray report.  She is currently not experiencing any side effects or adverse reactions to her pain medication. Her pharmacy remains [facility] [address] in [place].     RTCB: 12/24/2023   Post-procedure evaluation   Type: Lumbar epidural steroid injection (LESI) (interlaminar) #1    Laterality: Right   Level:  L2-3 Level.  Imaging: Fluoroscopic guidance         Anesthesia: Local anesthesia (1-2% Lidocaine) Anxiolysis: None                 Sedation: No Sedation                       DOS: 09/14/2023  Performed by: Oswaldo Done, MD  Purpose: Diagnostic/Therapeutic Indications: Lumbar radicular pain of intraspinal etiology of more than 4 weeks  that has failed to respond to conservative therapy and is severe enough to impact quality of life or function. 1. Chronic low back pain (1ry area of Pain) (Bilateral) (R>L) w/o sciatica   2. Chronic lower extremity pain (Right)   3. DDD (degenerative disc disease), lumbosacral w/ LBP and LEP   4. Lumbar Grade 1 Retrolisthesis (2 mm) of L2/L3   5. Lumbar foraminal stenosis (Left: L2-3) (Bilateral: L5-S1)   6. Lumbar radiculitis (L1/L2) (Right)   7. Lumbosacral radiculopathy at L2 (Right)   8. Lumbosacral radiculopathy at L3 (Right)    NAS-11 Pain score:   Pre-procedure: 3 /10   Post-procedure: 0-No pain/10     Effectiveness:  Initial hour after procedure: 100 %. Subsequent 4-6 hours post-procedure: 100 %. Analgesia past initial 6 hours: 75 % (ongoing). Ongoing improvement:  Analgesic: The patient indicates having attained 100% relief of the pain for the duration of the local anesthetic followed by ongoing 75% improvement of her low back pain and lower extremity pain. Function: Ms. Randon reports improvement in function ROM: Ms. Hahne reports improvement in ROM  Pharmacotherapy Assessment  Analgesic: Hydrocodone/APAP 7.5/325 1 tablet p.o. 3 times daily (22.5 mg/day of hydrocodone)  MME/day: 22.5 mg/day.   Monitoring: [place] PMP: PDMP reviewed during this encounter.       Pharmacotherapy: No side-effects or adverse reactions reported. Compliance: No problems identified. Effectiveness: Clinically acceptable.  Valerie Salts, RN  09/20/2023  8:12 AM  Sign when Signing Visit Nursing Pain Medication Assessment:  Safety precautions to be maintained throughout the outpatient stay will include: orient to surroundings, keep bed in low position, maintain call bell  within reach at all times, provide assistance with transfer out of bed and ambulation.  Medication Inspection Compliance: Pill count conducted under aseptic conditions, in front of the patient. Neither the pills nor the bottle was  removed from the patient's sight at any time. Once count was completed pills were immediately returned to the patient in their original bottle.  Medication: Hydrocodone/APAP Pill/Patch Count:  7 of 90 pills remain Pill/Patch Appearance: Markings consistent with prescribed medication Bottle Appearance: Standard pharmacy container. Clearly labeled. Filled Date: 01 / 24 / 2025 Last Medication intake:  Today    No results found for: "CBDTHCR" No results found for: "D8THCCBX" No results found for: "D9THCCBX"  UDS:  Summary  Date Value Ref Range Status  03/22/2023 Note  Final    Comment:    ==================================================================== [employer] [facility] 13 (MW) ==================================================================== Test                             Result       Flag       Units  Drug Present and Declared for Prescription Verification   Hydrocodone                    1894         EXPECTED   ng/mg creat   Hydromorphone                  329          EXPECTED   ng/mg creat   Dihydrocodeine                 292          EXPECTED   ng/mg creat   Norhydrocodone                 1758         EXPECTED   ng/mg creat    Sources of hydrocodone include scheduled prescription medications.    Hydromorphone, dihydrocodeine and norhydrocodone are expected    metabolites of hydrocodone. Hydromorphone and dihydrocodeine are    also available as scheduled prescription medications.    Oxymorphone                    335          EXPECTED   ng/mg creat   Noroxymorphone                 134          EXPECTED   ng/mg creat    Sources of oxymorphone are scheduled prescription medications;    oxymorphone is also a metabolite of oxycodone. Noroxymorphone is an    expected metabolite of oxymorphone.  Drug Absent but Declared for Prescription Verification   Oxycodone                      Not Detected UNEXPECTED ng/mg creat    Oxycodone is almost always present in patients taking  this drug    consistently.  Absence of oxycodone could be due to lapse of time    since the last dose or unusual pharmacokinetics (rapid metabolism).  ==================================================================== Test                      Result    Flag   Units      Ref Range   Creatinine  65               mg/dL      >=40 ==================================================================== Declared Medications:  The flagging and interpretation on this report are based on the  following declared medications.  Unexpected results may arise from  inaccuracies in the declared medications.   **Note: The testing scope of this panel includes these medications:   Hydrocodone (Norco)  Oxycodone (Percocet)   **Note: The testing scope of this panel does not include the  following reported medications:   Acetaminophen (Norco)  Acetaminophen (Percocet)  Azelastine  Cetirizine (Zyrtec)  Escitalopram (Lexapro)  Fludrocortisone (Florinef)  Fluticasone (Flonase)  Hydrocortisone (Cortef)  Hyoscyamine  Lansoprazole (Prevacid)  Meloxicam (Mobic)  Methenamine  Methylene Blue  Naloxone (Narcan)  Omega-3 Fatty Acids  Pseudoephedrine (Sudafed)  Supplement  Vitamin D2 ==================================================================== For clinical consultation, please call [phone]. ====================================================================       ROS  Constitutional: Denies any fever or chills Gastrointestinal: No reported hemesis, hematochezia, vomiting, or acute GI distress Musculoskeletal: Denies any acute onset joint swelling, redness, loss of ROM, or weakness Neurological: No reported episodes of acute onset apraxia, aphasia, dysarthria, agnosia, amnesia, paralysis, loss of coordination, or loss of consciousness  Medication Review  Azelastine HCl, HYDROcodone-acetaminophen, Urogesic-Blue, cetirizine, escitalopram, fludrocortisone, fluticasone,  hydrocortisone, lansoprazole, meloxicam, naloxone, omega-3 acid ethyl esters, pseudoephedrine, and tiZANidine  History Review  Allergy: Ms. Redlich is allergic to tramadol, lisinopril, and sulfa antibiotics. Drug: Ms. Darr  reports no history of drug use. Alcohol:  reports that she does not currently use alcohol. Tobacco:  reports that she quit smoking about 29 years ago. Her smoking use included cigarettes. She has never used smokeless tobacco. Social: Ms. Oatley  reports that she quit smoking about 29 years ago. Her smoking use included cigarettes. She has never used smokeless tobacco. She reports that she does not currently use alcohol. She reports that she does not use drugs. Medical:  has a past medical history of Addison disease (HCC), Arthritis, Back pain, Complication of anesthesia, GERD (gastroesophageal reflux disease), and History of kidney stones. Surgical: Ms. Hollick  has a past surgical history that includes Cholecystectomy (2010); Polypectomy; dental work; Dilation and curettage of uterus (2010); IR NEPHROSTOMY PLACEMENT LEFT (12/14/2020); Nephrolithotomy (Left, 12/14/2020); Wisdom tooth extraction; Diagnostic laparoscopy (2004); Cystoscopy/ureteroscopy/holmium laser/stent placement (Right, 06/28/2021); Cystoscopy w/ ureteral stent removal (Right, 08/09/2021); Cystoscopy w/ retrogrades (Right, 08/09/2021); Cystoscopy/ureteroscopy/holmium laser/stent placement (Right, 08/09/2021); and Colonoscopy with propofol (N/A, 06/08/2022). Family: family history includes Alcohol abuse in her father; Arthritis in her mother; Brain cancer in her mother; Colon polyps (age of onset: 43) in her maternal grandmother; Hearing loss in her father; High blood pressure in her father and mother; Hyperlipidemia in her father and mother; Hypertension in her mother; Liver cancer in her mother; Lung cancer in her mother.  Laboratory Chemistry Profile   Renal Lab Results  Component Value Date   BUN 18  09/12/2023   CREATININE 0.91 09/12/2023   BCR 20 09/12/2023   GFR 84.36 08/26/2021   GFRAA 86 08/16/2018   GFRNONAA >60 09/18/2022    Hepatic Lab Results  Component Value Date   AST 25 09/12/2023   ALT 31 09/12/2023   ALBUMIN 4.4 09/12/2023   ALKPHOS 68 09/12/2023   LIPASE 38 09/16/2022    Electrolytes Lab Results  Component Value Date   NA 142 09/12/2023   K 3.9 09/12/2023   CL 104 09/12/2023   CALCIUM 9.5 09/12/2023   MG 2.2 09/18/2022   PHOS 4.3 09/18/2022  Bone Lab Results  Component Value Date   VD25OH 14.0 (L) 06/15/2022   25OHVITD1 67 08/16/2018   25OHVITD2 <1.0 08/16/2018   25OHVITD3 67 08/16/2018   TESTOSTERONE 1 (L) 08/13/2020    Inflammation (CRP: Acute Phase) (ESR: Chronic Phase) Lab Results  Component Value Date   CRP 2 08/16/2018   ESRSEDRATE 37 (H) 08/16/2018   LATICACIDVEN 1.9 09/16/2022         Note: Above Lab results reviewed.  Recent Imaging Review  DG PAIN CLINIC C-ARM 1-60 MIN NO REPORT Fluoro was used, but no Radiologist interpretation will be provided.  Please refer to "NOTES" tab for provider progress note. Note: Reviewed        Physical Exam  General appearance: Well nourished, well developed, and well hydrated. In no apparent acute distress Mental status: Alert, oriented x 3 (person, place, & time)       Respiratory: No evidence of acute respiratory distress Eyes: PERLA Vitals: BP (!) 145/89   Pulse 94   Temp 97.6 F (36.4 C)   Resp 16   Ht 5\' 4"  (1.626 m)   Wt 175 lb (79.4 kg)   LMP 12/01/2020 (Approximate)   SpO2 99%   BMI 30.04 kg/m  BMI: Estimated body mass index is 30.04 kg/m as calculated from the following:   Height as of this encounter: 5\' 4"  (1.626 m).   Weight as of this encounter: 175 lb (79.4 kg). Ideal: Ideal body weight: 54.7 kg (120 lb 9.5 oz) Adjusted ideal body weight: 64.6 kg (142 lb 5.7 oz)  Assessment   Diagnosis Status  1. Chronic low back pain (1ry area of Pain) (Bilateral) (R>L) w/o sciatica    2. Low back pain of over 3 months duration   3. Multifactorial low back pain   4. Chronic lower extremity pain (Right)   5. Lumbar Grade 1 Retrolisthesis (2 mm) of L2/L3   6. Lumbar radiculitis (L1/L2) (Right)   7. Lumbosacral radiculopathy at L2 (Right)   8. Lumbosacral radiculopathy at L3 (Right)   9. L5-S1 pars defect w/ spondylolisthesis (Bilateral)   10. Spondylolisthesis (8 mm) at L5-S1 level   11. Lumbosacral Anterolisthesis of L5/S1 (Dynamic) (N/E:50mm  F:79mm)   12. Chronic sacroiliac joint pain (2ry area of Pain) (Right)   13. Lumbar facet syndrome (Bilateral)   14. Chronic hip pain (Bilateral)   15. Chronic pain syndrome   16. Pharmacologic therapy   17. Chronic use of opiate for therapeutic purpose   18. Encounter for medication management   19. Encounter for chronic pain management   20. Postop check    Improved Improved Improved   Updated Problems: Problem  Lumbosacral Anterolisthesis of L5/S1 (Dynamic) (N/E:51mm  F:16mm)   Diagnostic x-rays of the lumbar spine with bending views (08/30/2023) IMPRESSION: 1. Anterolisthesis of L5 on S1 with prominent degenerative disc disease and facet hypertrophy. Anterolisthesis measures 11 mm on neutral and extension, 14 mm on flexion. The known pars defects on prior exam are not well demonstrated by radiograph.   Multifactorial Low Back Pain  Low Back Pain of Over 3 Months Duration   Plan of Care  Problem-specific:  Assessment and Plan    Lumbar Spondylolisthesis Anterolisthesis at L5-S1 has progressed from 8 mm to 11 mm in neutral position and extension, and to 14 mm on flexion, indicating dynamic movement. Retrolisthesis at L2-3 remains at 2 mm. There is a 75% improvement in pain following a lumbar epidural steroid injection at L2-3, with only occasional mild pain. A referral  to neurosurgery is discussed for potential fusion surgery and preventive measures. Instability criteria (4 mm displacement) and current displacement  (3 mm) are explained. Refer to neurosurgery for evaluation of potential fusion surgery and preventive measures. Place a standing order for a second lumbar epidural steroid injection if pain worsens.  Pain Management There is significant improvement in pain following the lumbar epidural steroid injection, with only occasional mild pain and no side effects or adverse reactions to current pain medication. Prefers to hold off on a second epidural injection but agrees to a standing order for future use if needed. Continue the current pain medication regimen. Cancel the follow-up appointment scheduled for September 12, 2023.      Ms. Tishia Maestre has a current medication list which includes the following long-term medication(s): azelastine hcl, escitalopram, flonase sensimist, [START ON 09/25/2023] hydrocodone-acetaminophen, [START ON 10/25/2023] hydrocodone-acetaminophen, [START ON 11/24/2023] hydrocodone-acetaminophen, lansoprazole, meloxicam, and omega-3 acid ethyl esters.  Pharmacotherapy (Medications Ordered): Meds ordered this encounter  Medications   HYDROcodone-acetaminophen (NORCO) 7.5-325 MG tablet    Sig: Take 1 tablet by mouth every 8 (eight) hours as needed for severe pain (pain score 7-10). Must last 30 days.    Dispense:  90 tablet    Refill:  0    DO NOT: delete (not duplicate); no partial-fill (will deny script to complete), no refill request (F/U required). DISPENSE: 1 day early if closed on fill date. WARN: No CNS-depressants within 8 hrs of med.   HYDROcodone-acetaminophen (NORCO) 7.5-325 MG tablet    Sig: Take 1 tablet by mouth every 8 (eight) hours as needed for severe pain (pain score 7-10). Must last 30 days.    Dispense:  90 tablet    Refill:  0    DO NOT: delete (not duplicate); no partial-fill (will deny script to complete), no refill request (F/U required). DISPENSE: 1 day early if closed on fill date. WARN: No CNS-depressants within 8 hrs of med.    HYDROcodone-acetaminophen (NORCO) 7.5-325 MG tablet    Sig: Take 1 tablet by mouth every 8 (eight) hours as needed for severe pain (pain score 7-10). Must last 30 days.    Dispense:  90 tablet    Refill:  0    DO NOT: delete (not duplicate); no partial-fill (will deny script to complete), no refill request (F/U required). DISPENSE: 1 day early if closed on fill date. WARN: No CNS-depressants within 8 hrs of med.   Orders:  Orders Placed This Encounter  Procedures   Lumbar Epidural Injection    Standing Status:   Standing    Number of Occurrences:   1    Expiration Date:   12/18/2023    Scheduling Instructions:     Procedure: Interlaminar Lumbar Epidural Steroid injection (LESI)  L2-3     Laterality: Right-sided     Sedation: With Sedation.     Timeframe: PRN    Where will this procedure be performed?:   [facility]   Ambulatory referral to Neurosurgery    Referral Priority:   Routine    Referral Type:   Surgical    Referral Reason:   Specialty Services Required    Referred to Provider:   Lovenia Kim, MD    Requested Specialty:   Neurosurgery    Number of Visits Requested:   1   Nursing Instructions:    Please complete this patient's postprocedure evaluation.    Scheduling Instructions:     Please complete this patient's postprocedure evaluation.  Follow-up plan:   No follow-ups on file.      Interventional Therapies  Risk  Complexity Considerations:   WNL   Planned  Pending:   Diagnostic right L2-3 LESI #2 (PRN)  Neurosurgery consult for evaluation of dynamic lumbar spondylosis and possible surgical alternatives (09/20/2023)   Under consideration:   Diagnostic right L2-3 LESI #2  Therapeutic bilateral lumbar facet medial branch RFA #3    Completed:   Diagnostic right L2-3 LESI x1 (09/14/2023) (3-0/10)  Therapeutic bilateral IA hip + bilateral trochanteric bursa inj. R4L3 (06/27/2023) (100/100/70/70)  Therapeutic bilateral lumbar facet MBB x3  (09/08/2020) (4-0) (100/100/60/60)  Therapeutic right lumbar facet RFA x2 (09/09/2021) (4-2) (100/100/90/>75)  Therapeutic left lumbar facet RFA x2 (10/07/2021) (4-0) (100/100/90/>75)  Therapeutic right IA hip inj. x2 (08/20/2020) (5-1) (100/100/80/80)  Therapeutic left IA hip inj. x1 (08/20/2020) (5-1) (100/100/80/80)  Therapeutic bilateral trochanteric bursa inj. x3 (04/07/2022)  (100/100/90/90)    Therapeutic  Palliative (PRN) options:   Palliative lumbar facet MBB   Palliative lumbar facet RFA  Palliative trochanteric bursa inj.    Pharmacotherapy  Nonopioids transferred 05/11/2020: Baclofen and Mobic      Recent Visits Date Type Provider Dept  09/14/23 Procedure visit Delano Metz, MD [facility]  08/30/23 Office Visit Delano Metz, MD [facility]  06/27/23 Procedure visit Delano Metz, MD [facility]  Showing recent visits within past 90 days and meeting all other requirements Today's Visits Date Type Provider Dept  09/20/23 Office Visit Delano Metz, MD [facility]  Showing today's visits and meeting all other requirements Future Appointments No visits were found meeting these conditions. Showing future appointments within next 90 days and meeting all other requirements  I discussed the assessment and treatment plan with the patient. The patient was provided an opportunity to ask questions and all were answered. The patient agreed with the plan and demonstrated an understanding of the instructions.  Patient advised to call back or seek an in-person evaluation if the symptoms or condition worsens.  Duration of encounter: 42 minutes.  Total time on encounter, as per AMA guidelines included both the face-to-face and non-face-to-face time personally spent by the physician and/or other qualified health care professional(s) on the day of the encounter (includes time in activities that require the physician or other  qualified health care professional and does not include time in activities normally performed by clinical staff). Physician's time may include the following activities when performed: Preparing to see the patient (e.g., pre-charting review of records, searching for previously ordered imaging, lab work, and nerve conduction tests) Review of prior analgesic pharmacotherapies. Reviewing PMP Interpreting ordered tests (e.g., lab work, imaging, nerve conduction tests) Performing post-procedure evaluations, including interpretation of diagnostic procedures Obtaining and/or reviewing separately obtained history Performing a medically appropriate examination and/or evaluation Counseling and educating the patient/family/caregiver Ordering medications, tests, or procedures Referring and communicating with other health care professionals (when not separately reported) Documenting clinical information in the electronic or other health record Independently interpreting results (not separately reported) and communicating results to the patient/ family/caregiver Care coordination (not separately reported)  Note by: Oswaldo Done, MD Date: 09/20/2023; Time: 8:58 AM

## 2023-09-20 NOTE — Progress Notes (Signed)
Nursing Pain Medication Assessment:  Safety precautions to be maintained throughout the outpatient stay will include: orient to surroundings, keep bed in low position, maintain call bell within reach at all times, provide assistance with transfer out of bed and ambulation.  Medication Inspection Compliance: Pill count conducted under aseptic conditions, in front of the patient. Neither the pills nor the bottle was removed from the patient's sight at any time. Once count was completed pills were immediately returned to the patient in their original bottle.  Medication: Hydrocodone/APAP Pill/Patch Count:  7 of 90 pills remain Pill/Patch Appearance: Markings consistent with prescribed medication Bottle Appearance: Standard pharmacy container. Clearly labeled. Filled Date: 01 / 24 / 2025 Last Medication intake:  Today

## 2023-09-20 NOTE — Patient Instructions (Addendum)
Filldates 09-25-23 10-25-23 11-24-23    reparing for your procedure (without sedation) Instructions: Oral Intake: Do not eat or drink anything for at least 3 hours prior to your procedure. Transportation: Unless otherwise stated by your physician, you may drive yourself after the procedure. Blood Pressure Medicine: Take your blood pressure medicine with a sip of water the morning of the procedure. Insulin: Take only  of your normal insulin dose. Preventing infections: Shower with an antibacterial soap the morning of your procedure. Build-up your immune system: Take 1000 mg of Vitamin C with every meal (3 times a day) the day prior to your procedure. Pregnancy: If you are pregnant, call and cancel the procedure. Sickness: If you have a cold, fever, or any active infections, call and cancel the procedure. Arrival: You must be in the facility at least 30 minutes prior to your scheduled procedure. Children: Do not bring any children with you. Dress appropriately: Bring dark clothing that you would not mind if they get stained. Valuables: Do not bring any jewelry or valuables. Procedure appointments are reserved for interventional treatments only. No Prescription Refills. No medication changes will be discussed during procedure appointments. No disability issues will be discussed.

## 2023-09-25 ENCOUNTER — Other Ambulatory Visit: Payer: Self-pay

## 2023-09-27 ENCOUNTER — Encounter: Payer: BC Managed Care – PPO | Admitting: Family Medicine

## 2023-09-27 NOTE — Progress Notes (Deleted)
   Annual physical  Subjective    Patient ID: Denise Burns, female    DOB: Mar 05, 1971  Age: 53 y.o. MRN: [id number]  No chief complaint on file.  HPI Kahlee is a 53 y.o. old female here  for annual exam.   Changes in his/her health in the last 12 months: {yes/no:63}  The patient currently works as a***/does not work***.  She is married/single/in a relationship and recently/not recently sexually active with ***female partner.  She does/does not use tobacco***, drinks***days per***, does***use recreational drugs.  The patient eats a ***diet.  She exercises ***times per week doing***.  The patient has ***regular periods.  Last date of menses was***.  Patient does*** use birth control:***  The patient does***have a family history of breast/ovarian/endometrial carcinoma.  Patient does*** have a family history of colorectal cancer.  The patient does***have an advanced directive.  The patient declines/endorses concerns for impaired hearing.  The patient declines/endorses concerns for impaired vision.  The patient does/does not see a dentist regularly.  The patient has the following chronic health issues that are monitored on a routine basis: ***  A1c 6.1  Cholesterol-A1c 191.  Lexapro 5 mg  Fludrocortisone, hydrocortisone  Pain-meloxicam, tizanidine, Norco 7.5 I am  HPI  Separate, acute concerns today: ***  The 10-year ASCVD risk score (Arnett DK, et al., 2019) is: 2.9%  Health Maintenance Due  Topic Date Due   Hepatitis C Screening  Never done   DTaP/Tdap/Td (1 - Tdap) Never done   Cervical Cancer Screening (HPV/Pap Cotest)  06/18/2021   INFLUENZA VACCINE  03/02/2023   COVID-19 Vaccine (4 - 2024-25 season) 04/02/2023      Objective:     LMP 12/01/2020 (Approximate)  {Vitals History (Optional):23777}  Physical Exam   No results found for any visits on 09/27/23.      Assessment & Plan:   There are no diagnoses linked to this encounter.   No follow-ups on  file.    Sandre Kitty, MD

## 2023-10-02 NOTE — Progress Notes (Unsigned)
 Referring Physician:  Sandre Kitty, MD [address] [place],  [place] [address]  Primary Physician:  Sandre Kitty, MD  History of Present Illness: 10/03/2023 [name] has a history of addison's disease, chronic pain syndrome, obesity, prediabetes, dyslipidemia.   She sees pain management Laban Emperor).   She has intermittent LBP with right groin/thigh pain to her knee. No left leg pain. She has new symptom of intermittent giving way of right leg. Pain is worse with prolonged standing. Some relief with mediations. She has weakness in right leg. No numbness or tingling.   She has known carpal tunnel in both hands.   She is taking norco, mobic, and zanaflex.   She does not smoke. Quit in 1996.   Bowel/Bladder Dysfunction: none, she has occasional urinary urgency  Conservative measures:  Physical therapy:  has not participated in PT in years (2019) Multimodal medical therapy including regular antiinflammatories:  hydrocodone, meloxicam, tizanidine Injections:   09/14/23: Right L2-3 ESI  06/27/23: Bilateral Bursitis Injection 03/09/23: Left L2-S1 RFA 02/21/23: Right L2-S1 RFA 09/06/22: Bilateral Hip injection  Past Surgery: no spinal surgeries  Denise Burns has no symptoms of cervical myelopathy.  The symptoms are causing a significant impact on the patient's life.   Review of Systems:  A 10 point review of systems is negative, except for the pertinent positives and negatives detailed in the HPI.  Past Medical History: Past Medical History:  Diagnosis Date   Addison disease (HCC)    Arthritis    Back pain    Complication of anesthesia    nausea   GERD (gastroesophageal reflux disease)    History of kidney stones     Past Surgical History: Past Surgical History:  Procedure Laterality Date   CHOLECYSTECTOMY  2010   COLONOSCOPY WITH PROPOFOL N/A 06/08/2022   Procedure: COLONOSCOPY WITH PROPOFOL;  Surgeon: Wyline Mood, MD;  Location: [facility] ENDOSCOPY;   Service: Gastroenterology;  Laterality: N/A;   CYSTOSCOPY W/ RETROGRADES Right 08/09/2021   Procedure: CYSTOSCOPY WITH RETROGRADE PYELOGRAM;  Surgeon: Vanna Scotland, MD;  Location: [facility] ORS;  Service: Urology;  Laterality: Right;   CYSTOSCOPY W/ URETERAL STENT REMOVAL Right 08/09/2021   Procedure: CYSTOSCOPY WITH STENT REMOVAL;  Surgeon: Vanna Scotland, MD;  Location: [facility] ORS;  Service: Urology;  Laterality: Right;   CYSTOSCOPY/URETEROSCOPY/HOLMIUM LASER/STENT PLACEMENT Right 06/28/2021   Procedure: CYSTOSCOPY/URETEROSCOPY/STENT PLACEMENT;  Surgeon: Vanna Scotland, MD;  Location: [facility] ORS;  Service: Urology;  Laterality: Right;   CYSTOSCOPY/URETEROSCOPY/HOLMIUM LASER/STENT PLACEMENT Right 08/09/2021   Procedure: CYSTOSCOPY/URETEROSCOPY/HOLMIUM LASER/STENT PLACEMENT;  Surgeon: Vanna Scotland, MD;  Location: [facility] ORS;  Service: Urology;  Laterality: Right;   dental work     DIAGNOSTIC LAPAROSCOPY  2004   endometriosis dx   DILATION AND CURETTAGE OF UTERUS  2010   IR NEPHROSTOMY PLACEMENT LEFT  12/14/2020   NEPHROLITHOTOMY Left 12/14/2020   Procedure: NEPHROLITHOTOMY PERCUTANEOUS;  Surgeon: Vanna Scotland, MD;  Location: [facility] ORS;  Service: Urology;  Laterality: Left;   POLYPECTOMY     WISDOM TOOTH EXTRACTION      Allergies: Allergies as of 10/03/2023 - Review Complete 09/20/2023  Allergen Reaction Noted   Tramadol Other (See Comments) 04/30/2018   Lisinopril Rash and Cough 04/30/2018   Sulfa antibiotics Other (See Comments) 04/30/2018    Medications: Outpatient Encounter Medications as of 10/03/2023  Medication Sig   Azelastine HCl 137 MCG/SPRAY SOLN Place 1 spray into each nostril 2 (two) times daily.   cetirizine (ZYRTEC) 10 MG tablet Take 10 mg by mouth  daily.   escitalopram (LEXAPRO) 5 MG tablet Take 1 tablet (5 mg total) by mouth daily.   fludrocortisone (FLORINEF) 0.1 MG tablet Take 0.5 tablets (0.05 mg total) by mouth daily.   fluticasone (FLONASE SENSIMIST) 27.5 MCG/SPRAY  nasal spray Place 1 spray into the nose in the morning and at bedtime.   HYDROcodone-acetaminophen (NORCO) 7.5-325 MG tablet Take 1 tablet by mouth every 8 (eight) hours as needed for severe pain (pain score 7-10). Must last 30 days.   [START ON 10/25/2023] HYDROcodone-acetaminophen (NORCO) 7.5-325 MG tablet Take 1 tablet by mouth every 8 (eight) hours as needed for severe pain (pain score 7-10). Must last 30 days.   [START ON 11/24/2023] HYDROcodone-acetaminophen (NORCO) 7.5-325 MG tablet Take 1 tablet by mouth every 8 (eight) hours as needed for severe pain (pain score 7-10). Must last 30 days.   hydrocortisone (CORTEF) 10 MG tablet Take 1.5 tablets (15 mg total) by mouth with breakfast and 1/2 tablet in the afternoon. (May double up during sick days)   lansoprazole (PREVACID) 30 MG capsule Take 1 capsule (30 mg total) by mouth daily at 12 noon.   meloxicam (MOBIC) 15 MG tablet Take 1 tablet (15 mg total) by mouth daily.   Methen-Hyosc-Meth Blue-Na Phos (UROGESIC-BLUE) 81.6 MG TABS Take 1 tablet (81.6 mg total) by mouth 4 (four) times daily as needed.   naloxone (NARCAN) nasal spray 4 mg/0.1 mL Place 1 spray into the nose as needed for opioid-induced respiratory depresssion. In case of emergency (overdose), spray once into each nostril. If no response within 3 minutes, repeat application and call 911.   omega-3 acid ethyl esters (LOVAZA) 1 g capsule Take 1 g by mouth daily.   pseudoephedrine (SUDAFED) 30 MG tablet Take 30 mg by mouth every 12 (twelve) hours as needed for congestion.    tiZANidine (ZANAFLEX) 4 MG tablet Take 1 tablet (4 mg total) by mouth every 8 (eight) hours as needed for muscle spasms.   No facility-administered encounter medications on file as of 10/03/2023.    Social History: Social History   Tobacco Use   Smoking status: Former    Current packs/day: 0.00    Types: Cigarettes    Quit date: 08/01/1994    Years since quitting: 29.1   Smokeless tobacco: Never  Vaping Use    Vaping status: Never Used  Substance Use Topics   Alcohol use: Not Currently    Comment: once per 6 months   Drug use: Never    Family Medical History: Family History  Problem Relation Age of Onset   Arthritis Mother    Hyperlipidemia Mother    Hypertension Mother    High blood pressure Mother    Lung cancer Mother    Liver cancer Mother    Brain cancer Mother    Alcohol abuse Father    Hearing loss Father    Hyperlipidemia Father    High blood pressure Father    Colon polyps Maternal Grandmother 37   Colon cancer Neg Hx    Esophageal cancer Neg Hx    Stomach cancer Neg Hx    Rectal cancer Neg Hx     Physical Examination: There were no vitals filed for this visit.  General: Patient is well developed, well nourished, calm, collected, and in no apparent distress. Attention to examination is appropriate.  Respiratory: Patient is breathing without any difficulty.   NEUROLOGICAL:     Awake, alert, oriented to person, place, and time.  Speech is clear and fluent. Fund  of knowledge is appropriate.   Cranial Nerves: Pupils equal round and reactive to light.  Facial tone is symmetric.   No posterior lumbar tenderness.   No abnormal lesions on exposed skin.   Strength: Side Biceps Triceps Deltoid Interossei Grip Wrist Ext. Wrist Flex.  R 5 5 5 5 5 5 5   L 5 5 5 5 5 5 5    Side Iliopsoas Quads Hamstring PF DF EHL  R 5 5 5 5 5 5   L 5 5 5 5 5 5    Reflexes are 2+ and symmetric at the biceps, brachioradialis, patella and achilles.   Hoffman's is absent.  Clonus is not present.   Bilateral upper and lower extremity sensation is intact to light touch.     She has no pain with IR/ER of both hips.   Gait is normal.     Medical Decision Making  Imaging: Lumbar xrays dated 08/30/23: FINDINGS: Five non-rib-bearing lumbar vertebra. 11 mm anterolisthesis of L5 on S1 on neutral and extension, this measures 14 mm on flexion. Prominent L5-S1 degenerative disc disease and  facet hypertrophy. The known L5 pars defects on prior exam are not well demonstrated by radiograph. The 2 mm of retrolisthesis of L2 on L3 on MRI is not seen on the current exam. There is mild L4-L5 and L3-L4 disc space narrowing. L3 hemangioma on prior exam is not well demonstrated by radiograph. No evidence of acute fracture or compression deformity. The sacroiliac joints are congruent.   IMPRESSION: 1. Anterolisthesis of L5 on S1 with prominent degenerative disc disease and facet hypertrophy. Anterolisthesis measures 11 mm on neutral and extension, 14 mm on flexion. The known pars defects on prior exam are not well demonstrated by radiograph. 2. Mild degenerative disc disease at L3-L4 and L4-L5.     Electronically Signed   By: Narda Rutherford M.D.   On: 09/08/2023 16:34  I have personally reviewed the images and agree with the above interpretation.  Assessment and Plan: Ms. Soltis has intermittent LBP with right groin/thigh pain to her knee x years. No left leg pain. She has new symptom of intermittent giving way of right leg. No numbness or tingling.   She has known slip at L5-S1 measuring 11 mm on neutral and extension and 14 mm on flexion.   Treatment options discussed with patient and following plan made:   - MRI of lumbar spine to further evaluate right lumbar radiculopathy. No improvement with time, medications, or injections.  - Once she has MRI completed she will let me know so I can call to get it read.  - Will schedule her to see Dr. Myer Haff to discuss possible surgical options once we have MRI results.   I spent a total of 35 minutes in face-to-face and non-face-to-face activities related to this patient's care today including review of outside records, review of imaging, review of symptoms, physical exam, discussion of differential diagnosis, discussion of treatment options, and documentation.   Thank you for involving me in the care of this patient.   Drake Leach PA-C Dept. of Neurosurgery

## 2023-10-03 ENCOUNTER — Encounter: Payer: Self-pay | Admitting: Orthopedic Surgery

## 2023-10-03 ENCOUNTER — Ambulatory Visit: Payer: BC Managed Care – PPO | Admitting: Orthopedic Surgery

## 2023-10-03 VITALS — BP 130/72 | Ht 64.0 in | Wt 181.0 lb

## 2023-10-03 DIAGNOSIS — M5416 Radiculopathy, lumbar region: Secondary | ICD-10-CM

## 2023-10-03 DIAGNOSIS — M4316 Spondylolisthesis, lumbar region: Secondary | ICD-10-CM

## 2023-10-03 DIAGNOSIS — M51362 Other intervertebral disc degeneration, lumbar region with discogenic back pain and lower extremity pain: Secondary | ICD-10-CM

## 2023-10-03 NOTE — Patient Instructions (Signed)
 It was so nice to see you today. Thank you so much for coming in.    You have a slip at L5-S1 that is likely causing your pain.   I want to get an MRI of your lower back to look into things further. We will get this approved through your insurance and [facility] will call you to schedule the appointment.   [facility] (building with the white pillars) is located off of [address]. The address is [address] [place], [place] [address].    After you have the MRI, please let me know so that I can get the scan read. Once we have the results, we will call to schedule you a follow up with Dr. Myer Haff to discuss possible surgery options.    Please do not hesitate to call if you have any questions or concerns. You can also message me in MyChart.   Drake Leach PA-C [phone]     The physicians and staff at [facility] at [facility] are committed to providing excellent care. You may receive a survey asking for feedback about your experience at our office. We value you your feedback and appreciate you taking the time to to fill it out. The [facility] leadership team is also available to discuss your experience in person, feel free to contact us [phone].

## 2023-10-06 ENCOUNTER — Ambulatory Visit
Admission: RE | Admit: 2023-10-06 | Discharge: 2023-10-06 | Disposition: A | Discharge status: Home / Self Care | Attending: Physician Assistant | Admitting: Physician Assistant

## 2023-10-06 ENCOUNTER — Other Ambulatory Visit: Payer: Self-pay

## 2023-10-06 ENCOUNTER — Ambulatory Visit
Admission: RE | Admit: 2023-10-06 | Discharge: 2023-10-06 | Disposition: A | Discharge status: Home / Self Care | Source: Ambulatory Visit | Attending: Physician Assistant | Admitting: Physician Assistant

## 2023-10-06 ENCOUNTER — Encounter: Payer: Self-pay | Admitting: Physician Assistant

## 2023-10-06 ENCOUNTER — Ambulatory Visit: Payer: BC Managed Care – PPO | Admitting: Physician Assistant

## 2023-10-06 VITALS — BP 133/83 | HR 85 | Ht 64.0 in | Wt 182.8 lb

## 2023-10-06 DIAGNOSIS — Z87448 Personal history of other diseases of urinary system: Secondary | ICD-10-CM | POA: Insufficient documentation

## 2023-10-06 DIAGNOSIS — N200 Calculus of kidney: Secondary | ICD-10-CM | POA: Diagnosis not present

## 2023-10-06 NOTE — Progress Notes (Signed)
 10/06/2023 10:00 AM   [name] [birth year] [id number]  CC: Chief Complaint  Patient presents with   Pyelonephritis   HPI: Denise Burns is a 53 y.o. female with PMH nephrolithiasis who presents today for annual follow-up.   Today she reports no flank pain, gross hematuria, or stones passed in the last year.  She has been taking [place] brand vaginal probiotic and bladder supplements and feels they are working well for her.  KUB today with no radiopaque urolithiasis.  PMH: Past Medical History:  Diagnosis Date   Addison disease (HCC)    Arthritis    Back pain    Complication of anesthesia    nausea   GERD (gastroesophageal reflux disease)    History of kidney stones     Surgical History: Past Surgical History:  Procedure Laterality Date   CHOLECYSTECTOMY  2010   COLONOSCOPY WITH PROPOFOL N/A 06/08/2022   Procedure: COLONOSCOPY WITH PROPOFOL;  Surgeon: Wyline Mood, MD;  Location: [facility] ENDOSCOPY;  Service: Gastroenterology;  Laterality: N/A;   CYSTOSCOPY W/ RETROGRADES Right 08/09/2021   Procedure: CYSTOSCOPY WITH RETROGRADE PYELOGRAM;  Surgeon: Vanna Scotland, MD;  Location: [facility] ORS;  Service: Urology;  Laterality: Right;   CYSTOSCOPY W/ URETERAL STENT REMOVAL Right 08/09/2021   Procedure: CYSTOSCOPY WITH STENT REMOVAL;  Surgeon: Vanna Scotland, MD;  Location: [facility] ORS;  Service: Urology;  Laterality: Right;   CYSTOSCOPY/URETEROSCOPY/HOLMIUM LASER/STENT PLACEMENT Right 06/28/2021   Procedure: CYSTOSCOPY/URETEROSCOPY/STENT PLACEMENT;  Surgeon: Vanna Scotland, MD;  Location: [facility] ORS;  Service: Urology;  Laterality: Right;   CYSTOSCOPY/URETEROSCOPY/HOLMIUM LASER/STENT PLACEMENT Right 08/09/2021   Procedure: CYSTOSCOPY/URETEROSCOPY/HOLMIUM LASER/STENT PLACEMENT;  Surgeon: Vanna Scotland, MD;  Location: [facility] ORS;  Service: Urology;  Laterality: Right;   dental work     DIAGNOSTIC LAPAROSCOPY  2004   endometriosis dx   DILATION AND CURETTAGE OF UTERUS  2010    IR NEPHROSTOMY PLACEMENT LEFT  12/14/2020   NEPHROLITHOTOMY Left 12/14/2020   Procedure: NEPHROLITHOTOMY PERCUTANEOUS;  Surgeon: Vanna Scotland, MD;  Location: [facility] ORS;  Service: Urology;  Laterality: Left;   POLYPECTOMY     WISDOM TOOTH EXTRACTION      Home Medications:  Allergies as of 10/06/2023       Reactions   Tramadol Other (See Comments)   Other reaction(s): muscle tension   Lisinopril Rash, Cough   Sulfa Antibiotics Other (See Comments)   Blisters in mouth         Medication List        Accurate as of October 06, 2023 10:00 AM. If you have any questions, ask your nurse or doctor.          Azelastine HCl 137 MCG/SPRAY Soln Place 1 spray into each nostril 2 (two) times daily.   cetirizine 10 MG tablet Commonly known as: ZYRTEC Take 10 mg by mouth daily.   escitalopram 5 MG tablet Commonly known as: LEXAPRO Take 1 tablet (5 mg total) by mouth daily.   Flonase Sensimist 27.5 MCG/SPRAY nasal spray Generic drug: fluticasone Place 1 spray into the nose in the morning and at bedtime.   fludrocortisone 0.1 MG tablet Commonly known as: FLORINEF Take 0.5 tablets (0.05 mg total) by mouth daily.   HYDROcodone-acetaminophen 7.5-325 MG tablet Commonly known as: NORCO Take 1 tablet by mouth every 8 (eight) hours as needed for severe pain (pain score 7-10). Must last 30 days.   HYDROcodone-acetaminophen 7.5-325 MG tablet Commonly known as: NORCO Take 1 tablet by mouth every 8 (eight) hours as needed for severe pain (  pain score 7-10). Must last 30 days. Start taking on: October 25, 2023   HYDROcodone-acetaminophen 7.5-325 MG tablet Commonly known as: NORCO Take 1 tablet by mouth every 8 (eight) hours as needed for severe pain (pain score 7-10). Must last 30 days. Start taking on: November 24, 2023   hydrocortisone 10 MG tablet Commonly known as: CORTEF Take 1.5 tablets (15 mg total) by mouth with breakfast and 1/2 tablet in the afternoon. (May double up during sick  days)   lansoprazole 30 MG capsule Commonly known as: Prevacid Take 1 capsule (30 mg total) by mouth daily at 12 noon.   meloxicam 15 MG tablet Commonly known as: MOBIC Take 1 tablet (15 mg total) by mouth daily.   naloxone 4 MG/0.1ML Liqd nasal spray kit Commonly known as: NARCAN Place 1 spray into the nose as needed for opioid-induced respiratory depresssion. In case of emergency (overdose), spray once into each nostril. If no response within 3 minutes, repeat application and call 911.   omega-3 acid ethyl esters 1 g capsule Commonly known as: LOVAZA Take 1 g by mouth daily.   pseudoephedrine 30 MG tablet Commonly known as: SUDAFED Take 30 mg by mouth every 12 (twelve) hours as needed for congestion.   tiZANidine 4 MG tablet Commonly known as: ZANAFLEX Take 1 tablet (4 mg total) by mouth every 8 (eight) hours as needed for muscle spasms.   Urogesic-Blue 81.6 MG Tabs Take 1 tablet (81.6 mg total) by mouth 4 (four) times daily as needed.        Allergies:  Allergies  Allergen Reactions   Tramadol Other (See Comments)    Other reaction(s): muscle tension   Lisinopril Rash and Cough   Sulfa Antibiotics Other (See Comments)    Blisters in mouth     Family History: Family History  Problem Relation Age of Onset   Arthritis Mother    Hyperlipidemia Mother    Hypertension Mother    High blood pressure Mother    Lung cancer Mother    Liver cancer Mother    Brain cancer Mother    Alcohol abuse Father    Hearing loss Father    Hyperlipidemia Father    High blood pressure Father    Colon polyps Maternal Grandmother 62   Colon cancer Neg Hx    Esophageal cancer Neg Hx    Stomach cancer Neg Hx    Rectal cancer Neg Hx     Social History:   reports that she quit smoking about 29 years ago. Her smoking use included cigarettes. She has never used smokeless tobacco. She reports that she does not currently use alcohol. She reports that she does not use drugs.  Physical  Exam: BP 133/83   Pulse 85   Ht 5\' 4"  (1.626 m)   Wt 182 lb 12.8 oz (82.9 kg)   LMP 12/01/2020 (Approximate)   BMI 31.38 kg/m   Constitutional:  Alert and oriented, no acute distress, nontoxic appearing HEENT: [place], AT Cardiovascular: No clubbing, cyanosis, or edema Respiratory: Normal respiratory effort, no increased work of breathing Skin: No rashes, bruises or suspicious lesions Neurologic: Grossly intact, no focal deficits, moving all 4 extremities Psychiatric: Normal mood and affect  Pertinent Imaging: KUB, 10/06/2023: See epic  I personally reviewed the images referenced above and note no radiopaque urolithiasis.  Assessment & Plan:   1. Nephrolithiasis (Primary) Asymptomatic x 1 year.  No stone seen on KUB today.  Will plan for annual follow-up with KUB next year.  If no significant stone growth in the interim, will be okay to follow-up as needed thereafter. - Abdomen 1 view (KUB); Future  Return in about 1 year (around 10/05/2024) for Annual stone visit with KUB prior.  Carman Ching, PA-C  [facility] [place] [address] [place], [place] [address] [phone]

## 2023-10-08 ENCOUNTER — Other Ambulatory Visit: Payer: Self-pay | Admitting: Family Medicine

## 2023-10-08 DIAGNOSIS — G8929 Other chronic pain: Secondary | ICD-10-CM

## 2023-10-09 ENCOUNTER — Other Ambulatory Visit: Payer: Self-pay

## 2023-10-09 ENCOUNTER — Other Ambulatory Visit: Payer: Self-pay | Admitting: Family Medicine

## 2023-10-09 DIAGNOSIS — G8929 Other chronic pain: Secondary | ICD-10-CM

## 2023-10-10 ENCOUNTER — Other Ambulatory Visit: Payer: Self-pay | Admitting: Internal Medicine

## 2023-10-10 ENCOUNTER — Ambulatory Visit
Admission: RE | Admit: 2023-10-10 | Discharge: 2023-10-10 | Disposition: A | Discharge status: Home / Self Care | Source: Ambulatory Visit | Attending: Orthopedic Surgery | Admitting: Orthopedic Surgery

## 2023-10-10 ENCOUNTER — Ambulatory Visit: Payer: BC Managed Care – PPO | Admitting: Pain Medicine

## 2023-10-10 DIAGNOSIS — M51362 Other intervertebral disc degeneration, lumbar region with discogenic back pain and lower extremity pain: Secondary | ICD-10-CM | POA: Insufficient documentation

## 2023-10-10 DIAGNOSIS — M4316 Spondylolisthesis, lumbar region: Secondary | ICD-10-CM | POA: Diagnosis not present

## 2023-10-10 DIAGNOSIS — M48061 Spinal stenosis, lumbar region without neurogenic claudication: Secondary | ICD-10-CM | POA: Diagnosis not present

## 2023-10-10 DIAGNOSIS — D1809 Hemangioma of other sites: Secondary | ICD-10-CM | POA: Diagnosis not present

## 2023-10-10 DIAGNOSIS — M4807 Spinal stenosis, lumbosacral region: Secondary | ICD-10-CM | POA: Diagnosis not present

## 2023-10-11 ENCOUNTER — Other Ambulatory Visit: Payer: Self-pay

## 2023-10-11 MED ORDER — FLUDROCORTISONE ACETATE 0.1 MG PO TABS
0.0500 mg | ORAL_TABLET | Freq: Every day | ORAL | 0 refills | Status: DC
  Filled 2023-10-11: qty 45, 90d supply, fill #0

## 2023-10-25 ENCOUNTER — Other Ambulatory Visit (HOSPITAL_COMMUNITY): Payer: Self-pay

## 2023-10-25 ENCOUNTER — Other Ambulatory Visit: Payer: Self-pay

## 2023-10-26 ENCOUNTER — Other Ambulatory Visit: Payer: Self-pay

## 2023-10-26 ENCOUNTER — Ambulatory Visit: Payer: BC Managed Care – PPO | Admitting: Internal Medicine

## 2023-10-26 ENCOUNTER — Encounter: Payer: Self-pay | Admitting: Internal Medicine

## 2023-10-26 VITALS — BP 118/76 | HR 85 | Ht 64.0 in | Wt 185.0 lb

## 2023-10-26 DIAGNOSIS — E271 Primary adrenocortical insufficiency: Secondary | ICD-10-CM | POA: Diagnosis not present

## 2023-10-26 DIAGNOSIS — E2839 Other primary ovarian failure: Secondary | ICD-10-CM | POA: Diagnosis not present

## 2023-10-26 DIAGNOSIS — R7303 Prediabetes: Secondary | ICD-10-CM | POA: Diagnosis not present

## 2023-10-26 MED ORDER — FLUDROCORTISONE ACETATE 0.1 MG PO TABS
0.0500 mg | ORAL_TABLET | Freq: Every day | ORAL | 3 refills | Status: AC
  Filled 2023-10-26 – 2024-01-19 (×2): qty 45, 90d supply, fill #0
  Filled 2024-04-20: qty 45, 90d supply, fill #1
  Filled 2024-07-20 – 2024-07-21 (×2): qty 45, 90d supply, fill #2

## 2023-10-26 MED ORDER — HYDROCORTISONE 10 MG PO TABS
ORAL_TABLET | ORAL | 3 refills | Status: AC
  Filled 2023-10-26: qty 180, 90d supply, fill #0
  Filled 2023-11-07: qty 200, 90d supply, fill #0
  Filled 2024-02-16: qty 200, 90d supply, fill #1
  Filled 2024-05-12: qty 200, 90d supply, fill #2
  Filled 2024-08-19: qty 200, 90d supply, fill #3

## 2023-10-26 NOTE — Progress Notes (Signed)
 Name: Denise Burns  MRN/ DOB: [id number], 1970-11-23    Age/ Sex: 53 y.o., female     PCP: Sandre Kitty, MD   Reason for Endocrinology Evaluation: Addison's Disease     Initial Endocrinology Clinic Visit: 08/06/2018    PATIENT IDENTIFIER: Denise Burns is a 53 y.o., female with a past medical history of Addison's Disease and premature ovarian  failure . She has followed with [facility] since 08/06/2018 for consultative assistance with management of her adrenal insufficiency.   HISTORICAL SUMMARY: The patient was first diagnosed with addison's disease in 2006. She presented with weight loss, hyperpigmentation, and  hypotension. She did not have cosyntropin stim test. She has been on [facility] and [facility] since her diagnosis.   Premature ovarian failure ~ 2017. She did have a postmenopausal bleed between 05/2018- 06/2018. Follows with [facility]    No FH of premature ovarian failure or autoimmune disease.     Testosterone and DHEA-S were not elevated  08/2020-for hair loss    PRE-DIABETES HISTORY :  A1c in 04/2021 was 6.3 %     SUBJECTIVE:    Today (10/26/2023):  Denise Burns is here for a 1 year follow up on adrenal insufficiency. She has been compliant with HC intake.    She continues to receive intra-articular injections of the spine She follows with urology for renal stones ( [facility] )    She did sustain a fall in 04/2023 resulting in right metatarsal head fracture  Denies dizziness Denies nausea or vomiting  Denies constipation or diarrhea  Denies leg swelling  LMP 2  yrs ago  No Fh of osteoporosis   She is on Calcium 1000/Vitamin D  mg daily    HOME ENDOCRINE MEDICATIONS: Hydrocortisone 15 mg QAM Hydrocortisone 5 mh QPM (between 2-4 pm) Fludrocortisone  0.1 mg , at half a tablet daily        HISTORY:  Past Medical History:  Past Medical History:  Diagnosis Date   Addison disease (HCC)    Arthritis    Back pain     Complication of anesthesia    nausea   GERD (gastroesophageal reflux disease)    History of kidney stones    Past Surgical History:  Past Surgical History:  Procedure Laterality Date   CHOLECYSTECTOMY  2010   COLONOSCOPY WITH PROPOFOL N/A 06/08/2022   Procedure: COLONOSCOPY WITH PROPOFOL;  Surgeon: Wyline Mood, MD;  Location: [facility] ENDOSCOPY;  Service: Gastroenterology;  Laterality: N/A;   CYSTOSCOPY W/ RETROGRADES Right 08/09/2021   Procedure: CYSTOSCOPY WITH RETROGRADE PYELOGRAM;  Surgeon: Vanna Scotland, MD;  Location: [facility] ORS;  Service: Urology;  Laterality: Right;   CYSTOSCOPY W/ URETERAL STENT REMOVAL Right 08/09/2021   Procedure: CYSTOSCOPY WITH STENT REMOVAL;  Surgeon: Vanna Scotland, MD;  Location: [facility] ORS;  Service: Urology;  Laterality: Right;   CYSTOSCOPY/URETEROSCOPY/HOLMIUM LASER/STENT PLACEMENT Right 06/28/2021   Procedure: CYSTOSCOPY/URETEROSCOPY/STENT PLACEMENT;  Surgeon: Vanna Scotland, MD;  Location: [facility] ORS;  Service: Urology;  Laterality: Right;   CYSTOSCOPY/URETEROSCOPY/HOLMIUM LASER/STENT PLACEMENT Right 08/09/2021   Procedure: CYSTOSCOPY/URETEROSCOPY/HOLMIUM LASER/STENT PLACEMENT;  Surgeon: Vanna Scotland, MD;  Location: [facility] ORS;  Service: Urology;  Laterality: Right;   dental work     DIAGNOSTIC LAPAROSCOPY  2004   endometriosis dx   DILATION AND CURETTAGE OF UTERUS  2010   IR NEPHROSTOMY PLACEMENT LEFT  12/14/2020   NEPHROLITHOTOMY Left 12/14/2020   Procedure: NEPHROLITHOTOMY PERCUTANEOUS;  Surgeon: Vanna Scotland, MD;  Location: [facility] ORS;  Service: Urology;  Laterality: Left;  POLYPECTOMY     WISDOM TOOTH EXTRACTION     Social History:  reports that she quit smoking about 29 years ago. Her smoking use included cigarettes. She has never used smokeless tobacco. She reports that she does not currently use alcohol. She reports that she does not use drugs. Family History:  Family History  Problem Relation Age of Onset   Arthritis Mother    Hyperlipidemia  Mother    Hypertension Mother    High blood pressure Mother    Lung cancer Mother    Liver cancer Mother    Brain cancer Mother    Alcohol abuse Father    Hearing loss Father    Hyperlipidemia Father    High blood pressure Father    Colon polyps Maternal Grandmother 95   Colon cancer Neg Hx    Esophageal cancer Neg Hx    Stomach cancer Neg Hx    Rectal cancer Neg Hx      HOME MEDICATIONS: Allergies as of 10/26/2023       Reactions   Tramadol Other (See Comments)   Other reaction(s): muscle tension   Lisinopril Rash, Cough   Sulfa Antibiotics Other (See Comments)   Blisters in mouth         Medication List        Accurate as of October 26, 2023  8:55 AM. If you have any questions, ask your nurse or doctor.          Azelastine HCl 137 MCG/SPRAY Soln Place 1 spray into each nostril 2 (two) times daily.   cetirizine 10 MG tablet Commonly known as: ZYRTEC Take 10 mg by mouth daily.   escitalopram 5 MG tablet Commonly known as: LEXAPRO Take 1 tablet (5 mg total) by mouth daily.   Flonase Sensimist 27.5 MCG/SPRAY nasal spray Generic drug: fluticasone Place 1 spray into the nose in the morning and at bedtime.   fludrocortisone 0.1 MG tablet Commonly known as: FLORINEF Take 0.5 tablets (0.05 mg total) by mouth daily.   HYDROcodone-acetaminophen 7.5-325 MG tablet Commonly known as: NORCO Take 1 tablet by mouth every 8 (eight) hours as needed for severe pain (pain score 7-10). Must last 30 days.   HYDROcodone-acetaminophen 7.5-325 MG tablet Commonly known as: NORCO Take 1 tablet by mouth every 8 (eight) hours as needed for severe pain (pain score 7-10). Must last 30 days.   HYDROcodone-acetaminophen 7.5-325 MG tablet Commonly known as: NORCO Take 1 tablet by mouth every 8 (eight) hours as needed for severe pain (pain score 7-10). Must last 30 days. Start taking on: November 24, 2023   hydrocortisone 10 MG tablet Commonly known as: CORTEF Take 1.5 tablets (15  mg total) by mouth with breakfast and 1/2 tablet in the afternoon. (May double up during sick days)   lansoprazole 30 MG capsule Commonly known as: Prevacid Take 1 capsule (30 mg total) by mouth daily at 12 noon.   meloxicam 15 MG tablet Commonly known as: MOBIC Take 1 tablet (15 mg total) by mouth daily.   naloxone 4 MG/0.1ML Liqd nasal spray kit Commonly known as: NARCAN Place 1 spray into the nose as needed for opioid-induced respiratory depresssion. In case of emergency (overdose), spray once into each nostril. If no response within 3 minutes, repeat application and call 911.   omega-3 acid ethyl esters 1 g capsule Commonly known as: LOVAZA Take 1 g by mouth daily.   pseudoephedrine 30 MG tablet Commonly known as: SUDAFED Take 30 mg by mouth every  12 (twelve) hours as needed for congestion.   tiZANidine 4 MG tablet Commonly known as: ZANAFLEX Take 1 tablet (4 mg total) by mouth every 8 (eight) hours as needed for muscle spasms.   Urogesic-Blue 81.6 MG Tabs Take 1 tablet (81.6 mg total) by mouth 4 (four) times daily as needed.          OBJECTIVE:   PHYSICAL EXAM: VS: BP 118/76 (BP Location: Left Arm, Patient Position: Sitting, Cuff Size: Normal)   Pulse 85   Ht 5\' 4"  (1.626 m)   Wt 185 lb (83.9 kg)   LMP 12/01/2020 (Approximate)   SpO2 95%   BMI 31.76 kg/m    EXAM: General: Pt appears well and is in NAD  Neck: General: Supple without adenopathy. Thyroid: Thyroid size normal.    Lungs: Clear with good BS bilat   Heart: Auscultation: RRR.  Abdomen: Soft, nontender  Extremities:  BL LE: No pretibial edema   Mental Status: Judgment, insight: Intact Orientation: Oriented to time, place, and person Mood and affect: No depression, anxiety, or agitation     DATA REVIEWED:    Latest Reference Range & Units 09/12/23 09:34  Sodium 134 - 144 mmol/L 142  Potassium 3.5 - 5.2 mmol/L 3.9  Chloride 96 - 106 mmol/L 104  CO2 20 - 29 mmol/L 24  Glucose 70 - 99  mg/dL 540 (H)  BUN 6 - 24 mg/dL 18  Creatinine 9.81 - 1.91 mg/dL 4.78  Calcium 8.7 - 29.5 mg/dL 9.5  BUN/Creatinine Ratio 9 - 23  20  eGFR >59 mL/min/1.73 76  Alkaline Phosphatase 44 - 121 IU/L 68  Albumin 3.8 - 4.9 g/dL 4.4  AST 0 - 40 IU/L 25  ALT 0 - 32 IU/L 31  Total Protein 6.0 - 8.5 g/dL 6.5  Total Bilirubin 0.0 - 1.2 mg/dL 0.7    Latest Reference Range & Units 09/12/23 09:34  Glucose 70 - 99 mg/dL 621 (H)  Hemoglobin H0Q 4.8 - 5.6 % 6.1 (H)  Est. average glucose Bld gHb Est-mCnc mg/dL 657  TSH 8.469 - 6.295 uIU/mL 1.720  (H): Data is abnormally high      ASSESSMENT / PLAN / RECOMMENDATIONS:   1. Primary Adrenal Insufficiency (Addison's Disease)     -Patient asymptomatic -Recent labs reviewed through PCPs office with normal electrolytes - She already wears a medical alert bracelet. - BMP normal  -In the past we had discussed switching HC to prednisone for ease of taking , but the patient will feels nauseous with prednisone - No changes today    Medications : - Hydrocortisone 15 mg QAM - Hydrocortisone 5 mh QPM (between 2-4 pm) - Continue Fludrocortisone  0.1 mg , at half a tablet daily      2.  Prediabetes:  -A1c has trended up from 5.9% to 6.1% -We again emphasized the importance of low-carb diet and exercise, recommended aquatic exercises to help with joint aches and pains -We also entertain the idea of metformin if needed   3.  Estrogen deficiency:  -She had toe fractures in the fall 2024, I did explain to the patient that toe fractures are Not considered fragility fractures but given her history of long glucose cord intake I would like to proceed with DXA scan -She has been postmenopausal for 2 years -She recently started taking calcium supplements  F/u in 1 yr    Signed electronically by: Lyndle Herrlich, MD  [facility]  [facility] [address] [place]  [place], [place] [address] Phone: [phone] FAX:  [phone]      CC: Sandre Kitty, MD [address] [place] [address] Phone: [phone]  Fax: [phone]   Return to Endocrinology clinic as below: Future Appointments  Date Time Provider Department Center  10/30/2023  4:10 PM Sandre Kitty, MD [facility] None  12/20/2023  3:00 PM Delano Metz, MD [facility] None  10/01/2024  9:40 AM Dannette Barbara, Lelon Mast, PA-C [facility] None

## 2023-10-30 ENCOUNTER — Ambulatory Visit: Payer: BC Managed Care – PPO | Admitting: Family Medicine

## 2023-11-06 ENCOUNTER — Encounter: Payer: Self-pay | Admitting: Orthopedic Surgery

## 2023-11-06 ENCOUNTER — Other Ambulatory Visit: Payer: Self-pay

## 2023-11-06 DIAGNOSIS — M4316 Spondylolisthesis, lumbar region: Secondary | ICD-10-CM

## 2023-11-06 DIAGNOSIS — M51362 Other intervertebral disc degeneration, lumbar region with discogenic back pain and lower extremity pain: Secondary | ICD-10-CM

## 2023-11-06 NOTE — Telephone Encounter (Signed)
 Lumbar MRI dated 10/10/23:  FINDINGS: Segmentation: There are five lumbar type vertebral bodies. The last full intervertebral disc space is labeled L5-S1. This correlates with the prior MRI examination.   Alignment: Bilateral pars defects at L5 with stable anterolisthesis. The other lumbar vertebral bodies are normally aligned.   Vertebrae: Stable large L3 hemangioma. No worrisome bone lesions or fractures.   Conus medullaris and cauda equina: Conus extends to the L1 level. Conus and cauda equina appear normal.   Paraspinal and other soft tissues: No significant paraspinal or retroperitoneal findings.   Disc levels:   T12-L1: No significant findings.   L1-2: Very small central disc protrusion no neural compression. No spinal or foraminal stenosis. Mild facet disease.   L2-3: Mild facet disease but no disc protrusions, spinal or foraminal stenosis.   L3-4: Moderate facet disease but no disc protrusions, spinal or foraminal stenosis.   L4-5: Annular fissure and shallow left paracentral disc protrusion contacting the left side of the thecal sac and potentially irritating the left L5 nerve root. The exiting L4 nerve roots are normal. Moderate to advanced right-sided facet disease.   L5-S1: Bilateral pars defects and associated severe bilateral facet disease, right greater than left with mild mass effect on the posterolateral aspect of the thecal sac. Diffuse bulging uncovered disc and mild osteophytic ridging but the spinal canal is generous and there is no significant spinal stenosis. Mild foraminal stenosis bilaterally.   IMPRESSION: 1. Bilateral pars defects at L5 with stable anterolisthesis. Associated stable appearing severe bilateral facet disease at L5-S1, right greater than left. 2. Annular fissure and shallow left paracentral disc protrusion at L4-5 contacting the left side of the thecal sac and potentially irritating the left L5 nerve root. 3. Mild foraminal  stenosis bilaterally at L5-S1.     Electronically Signed   By: Rudie Meyer M.D.   On: 11/05/2023 14:42  I have personally reviewed the images and agree with the above interpretation.  Previous lumbar xrays showed slip at L5-S1 measuring 11 mm on neutral and extension and 14 mm on flexion.   She has not done PT, but slip is > 10 mm.   Message to patient to see if she wants to revisit PT prior to seeing Dr. Myer Haff.

## 2023-11-07 ENCOUNTER — Other Ambulatory Visit: Payer: Self-pay | Admitting: Family Medicine

## 2023-11-07 DIAGNOSIS — F411 Generalized anxiety disorder: Secondary | ICD-10-CM

## 2023-11-07 DIAGNOSIS — G8929 Other chronic pain: Secondary | ICD-10-CM

## 2023-11-08 ENCOUNTER — Other Ambulatory Visit: Payer: Self-pay

## 2023-11-08 MED ORDER — ESCITALOPRAM OXALATE 5 MG PO TABS
5.0000 mg | ORAL_TABLET | Freq: Every day | ORAL | 1 refills | Status: DC
  Filled 2023-11-08 – 2023-11-13 (×2): qty 90, 90d supply, fill #0
  Filled 2024-03-20: qty 90, 90d supply, fill #1

## 2023-11-08 MED ORDER — TIZANIDINE HCL 4 MG PO TABS
4.0000 mg | ORAL_TABLET | Freq: Three times a day (TID) | ORAL | 1 refills | Status: DC | PRN
  Filled 2023-11-08: qty 90, 30d supply, fill #0
  Filled 2024-02-16: qty 90, 30d supply, fill #1

## 2023-11-13 ENCOUNTER — Other Ambulatory Visit: Payer: Self-pay

## 2023-11-14 NOTE — Addendum Note (Signed)
 Addended byLucetta Russel on: 11/14/2023 01:20 PM   Modules accepted: Orders

## 2023-11-17 ENCOUNTER — Encounter: Payer: Self-pay | Admitting: Internal Medicine

## 2023-11-22 ENCOUNTER — Other Ambulatory Visit: Payer: Self-pay

## 2023-11-24 ENCOUNTER — Other Ambulatory Visit: Payer: Self-pay

## 2023-12-18 ENCOUNTER — Ambulatory Visit: Attending: Pain Medicine | Admitting: Nurse Practitioner

## 2023-12-18 ENCOUNTER — Encounter: Payer: Self-pay | Admitting: Nurse Practitioner

## 2023-12-18 VITALS — BP 138/100 | HR 99 | Temp 97.9°F | Resp 16 | Ht 64.0 in | Wt 175.0 lb

## 2023-12-18 DIAGNOSIS — Z79899 Other long term (current) drug therapy: Secondary | ICD-10-CM | POA: Diagnosis not present

## 2023-12-18 DIAGNOSIS — M47816 Spondylosis without myelopathy or radiculopathy, lumbar region: Secondary | ICD-10-CM

## 2023-12-18 DIAGNOSIS — G8929 Other chronic pain: Secondary | ICD-10-CM | POA: Insufficient documentation

## 2023-12-18 DIAGNOSIS — M5416 Radiculopathy, lumbar region: Secondary | ICD-10-CM | POA: Diagnosis not present

## 2023-12-18 DIAGNOSIS — M51372 Other intervertebral disc degeneration, lumbosacral region with discogenic back pain and lower extremity pain: Secondary | ICD-10-CM | POA: Diagnosis not present

## 2023-12-18 DIAGNOSIS — Z79891 Long term (current) use of opiate analgesic: Secondary | ICD-10-CM | POA: Diagnosis not present

## 2023-12-18 DIAGNOSIS — M79604 Pain in right leg: Secondary | ICD-10-CM | POA: Diagnosis not present

## 2023-12-18 DIAGNOSIS — M25552 Pain in left hip: Secondary | ICD-10-CM | POA: Diagnosis not present

## 2023-12-18 DIAGNOSIS — M533 Sacrococcygeal disorders, not elsewhere classified: Secondary | ICD-10-CM

## 2023-12-18 DIAGNOSIS — M5417 Radiculopathy, lumbosacral region: Secondary | ICD-10-CM | POA: Diagnosis not present

## 2023-12-18 DIAGNOSIS — M4310 Spondylolisthesis, site unspecified: Secondary | ICD-10-CM

## 2023-12-18 DIAGNOSIS — M25551 Pain in right hip: Secondary | ICD-10-CM | POA: Diagnosis not present

## 2023-12-18 DIAGNOSIS — M545 Low back pain, unspecified: Secondary | ICD-10-CM | POA: Insufficient documentation

## 2023-12-18 DIAGNOSIS — G894 Chronic pain syndrome: Secondary | ICD-10-CM

## 2023-12-18 MED ORDER — HYDROCODONE-ACETAMINOPHEN 7.5-325 MG PO TABS: 1.0000 | ORAL_TABLET | Freq: Three times a day (TID) | ORAL | 0 refills | Status: DC | PRN

## 2023-12-18 NOTE — Patient Instructions (Signed)

## 2023-12-18 NOTE — Progress Notes (Signed)
 Nursing Pain Medication Assessment:  Safety precautions to be maintained throughout the outpatient stay will include: orient to surroundings, keep bed in low position, maintain call bell within reach at all times, provide assistance with transfer out of bed and ambulation.  Medication Inspection Compliance: Pill count conducted under aseptic conditions, in front of the patient. Neither the pills nor the bottle was removed from the patient's sight at any time. Once count was completed pills were immediately returned to the patient in their original bottle.  Medication: Hydrocodone /APAP Pill/Patch Count: 5 of 90 pills/patches remain Pill/Patch Appearance: Markings consistent with prescribed medication Bottle Appearance: Standard pharmacy container. Clearly labeled. Filled Date: 4 / 25 / 2025 Last Medication intake:  Today

## 2023-12-18 NOTE — Progress Notes (Addendum)
 PROVIDER NOTE: Interpretation of information contained herein should be left to medically-trained personnel. Specific patient instructions are provided elsewhere under "Patient Instructions" section of medical record. This document was created in part using AI and STT-dictation technology, any transcriptional errors that may result from this process are unintentional.  Patient: Denise Burns  Service: E/M   PCP: Laneta Pintos, MD  DOB: 28-Mar-1971  DOS: 12/18/2023  Provider: Candi Chafe, MD  MRN: [id number]  Delivery: Face-to-face  Specialty: Interventional Pain Management  Type: Established Patient  Setting: Ambulatory outpatient facility  Specialty designation: 09  Referring Prov.: Laneta Pintos, MD  Location: Outpatient office facility       HPI  [name], a 53 y.o. year old female, is here today because of her Right lumbar radiculitis [M54.16]. [name]'s primary complain today is Back Pain and Hip Pain (bilat)   Pain Assessment: Severity of Chronic pain is reported as a 4 /10. Location: Back Lower/to hips bilat; right side down right leg to knee. Onset: More than a month ago. Quality: Aching, Shooting. Timing: Constant. Modifying factor(s): meds. Vitals:  height is 5\' 4"  (1.626 m) and weight is 175 lb (79.4 kg). Her temperature is 97.9 F (36.6 C). Her blood pressure is 138/100 (abnormal) and her pulse is 99. Her respiration is 16 and oxygen saturation is 95%.  BMI: Estimated body mass index is 30.04 kg/m as calculated from the following:   Height as of this encounter: 5\' 4"  (1.626 m).   Weight as of this encounter: 175 lb (79.4 kg). Last encounter: 09/20/2023.  Reason for encounter: medication management.  The patient reports doing well on the current medication regimen, with no adverse reaction or side effects reported to medication.  The patient reports low back pain and bilateral hip pain.   Of note, the patient received a L2-3 lumbar epidural steroid  injection (LESI) #1 on 09/14/2023, which resulted in approximately 75% improvement in symptoms.  Given the reoccurrence of lumbar pain, we discussed the proceeding with a repeat the lumbar epidural steroid injection.  Pharmacotherapy Assessment  Analgesic: Hydrocodone -acetaminophen  (Norco) 7.5-325 mg tablet every 8 hours as needed for severe pain. MME=22.50 Monitoring: [place] PMP: PDMP reviewed during this encounter.       Pharmacotherapy: No side-effects or adverse reactions reported. Compliance: No problems identified. Effectiveness: Clinically acceptable.  Lennis Rabon, RN  12/18/2023  9:26 AM  Signed Nursing Pain Medication Assessment:  Safety precautions to be maintained throughout the outpatient stay will include: orient to surroundings, keep bed in low position, maintain call bell within reach at all times, provide assistance with transfer out of bed and ambulation.  Medication Inspection Compliance: Pill count conducted under aseptic conditions, in front of the patient. Neither the pills nor the bottle was removed from the patient's sight at any time. Once count was completed pills were immediately returned to the patient in their original bottle.  Medication: Hydrocodone /APAP Pill/Patch Count: 5 of 90 pills/patches remain Pill/Patch Appearance: Markings consistent with prescribed medication Bottle Appearance: Standard pharmacy container. Clearly labeled. Filled Date: 4 / 25 / 2025 Last Medication intake:  Today    No results found for: "CBDTHCR" No results found for: "D8THCCBX" No results found for: "D9THCCBX"  UDS:  Summary  Date Value Ref Range Status  03/22/2023 Note  Final    Comment:    ==================================================================== [facility] (MW) ==================================================================== Test  Result       Flag       Units  Drug Present and Declared for Prescription Verification    Hydrocodone                     1894         EXPECTED   ng/mg creat   Hydromorphone                   329          EXPECTED   ng/mg creat   Dihydrocodeine                 292          EXPECTED   ng/mg creat   Norhydrocodone                 1758         EXPECTED   ng/mg creat    Sources of hydrocodone  include scheduled prescription medications.    Hydromorphone , dihydrocodeine and norhydrocodone are expected    metabolites of hydrocodone . Hydromorphone  and dihydrocodeine are    also available as scheduled prescription medications.    Oxymorphone                    335          EXPECTED   ng/mg creat   Noroxymorphone                 134          EXPECTED   ng/mg creat    Sources of oxymorphone are scheduled prescription medications;    oxymorphone is also a metabolite of oxycodone . Noroxymorphone is an    expected metabolite of oxymorphone.  Drug Absent but Declared for Prescription Verification   Oxycodone                       Not Detected UNEXPECTED ng/mg creat    Oxycodone  is almost always present in patients taking this drug    consistently.  Absence of oxycodone  could be due to lapse of time    since the last dose or unusual pharmacokinetics (rapid metabolism).  ==================================================================== Test                      Result    Flag   Units      Ref Range   Creatinine              65               mg/dL      >=84 ==================================================================== Declared Medications:  The flagging and interpretation on this report are based on the  following declared medications.  Unexpected results may arise from  inaccuracies in the declared medications.   **Note: The testing scope of this panel includes these medications:   Hydrocodone  (Norco)  Oxycodone  (Percocet)   **Note: The testing scope of this panel does not include the  following reported medications:   Acetaminophen  (Norco)  Acetaminophen  (Percocet)   Azelastine   Cetirizine (Zyrtec)  Escitalopram  (Lexapro )  Fludrocortisone  (Florinef )  Fluticasone  (Flonase )  Hydrocortisone  (Cortef )  Hyoscyamine  Lansoprazole  (Prevacid )  Meloxicam  (Mobic )  Methenamine   Methylene Blue  Naloxone  (Narcan )  Omega-3 Fatty Acids  Pseudoephedrine  (Sudafed)  Supplement  Vitamin D2 ==================================================================== For clinical consultation, please call [phone]. ====================================================================       ROS  Constitutional: Denies any fever or chills Gastrointestinal:  No reported hemesis, hematochezia, vomiting, or acute GI distress Musculoskeletal: low back pain bilateral (R>L) radiate down to bilateral hip  Neurological: No reported episodes of acute onset apraxia, aphasia, dysarthria, agnosia, amnesia, paralysis, loss of coordination, or loss of consciousness  Medication Review  Azelastine  HCl, HYDROcodone -acetaminophen , Urogesic-Blue, cetirizine, escitalopram , fludrocortisone , fluticasone , hydrocortisone , lansoprazole , meloxicam , naloxone , omega-3 acid ethyl esters, pseudoephedrine , and tiZANidine   History Review  Allergy: [name] is allergic to tramadol, lisinopril, and sulfa antibiotics. Drug: [name]  reports no history of drug use. Alcohol:  reports that she does not currently use alcohol. Tobacco:  reports that she quit smoking about 29 years ago. Her smoking use included cigarettes. She has never used smokeless tobacco. Social: [name]  reports that she quit smoking about 29 years ago. Her smoking use included cigarettes. She has never used smokeless tobacco. She reports that she does not currently use alcohol. She reports that she does not use drugs. Medical:  has a past medical history of Addison disease (HCC), Arthritis, Back pain, Complication of anesthesia, GERD (gastroesophageal reflux disease), and History of kidney stones. Surgical: [name]   has a past surgical history that includes Cholecystectomy (2010); Polypectomy; dental work; Dilation and curettage of uterus (2010); IR NEPHROSTOMY PLACEMENT LEFT (12/14/2020); Nephrolithotomy (Left, 12/14/2020); Wisdom tooth extraction; Diagnostic laparoscopy (2004); Cystoscopy/ureteroscopy/holmium laser/stent placement (Right, 06/28/2021); Cystoscopy w/ ureteral stent removal (Right, 08/09/2021); Cystoscopy w/ retrogrades (Right, 08/09/2021); Cystoscopy/ureteroscopy/holmium laser/stent placement (Right, 08/09/2021); and Colonoscopy with propofol  (N/A, 06/08/2022). Family: family history includes Alcohol abuse in her father; Arthritis in her mother; Brain cancer in her mother; Colon polyps (age of onset: 70) in her maternal grandmother; Hearing loss in her father; High blood pressure in her father and mother; Hyperlipidemia in her father and mother; Hypertension in her mother; Liver cancer in her mother; Lung cancer in her mother.  Laboratory Chemistry Profile   Renal Lab Results  Component Value Date   BUN 18 09/12/2023   CREATININE 0.91 09/12/2023   BCR 20 09/12/2023   GFR 84.36 08/26/2021   GFRAA 86 08/16/2018   GFRNONAA >60 09/18/2022    Hepatic Lab Results  Component Value Date   AST 25 09/12/2023   ALT 31 09/12/2023   ALBUMIN 4.4 09/12/2023   ALKPHOS 68 09/12/2023   LIPASE 38 09/16/2022    Electrolytes Lab Results  Component Value Date   NA 142 09/12/2023   K 3.9 09/12/2023   CL 104 09/12/2023   CALCIUM 9.5 09/12/2023   MG 2.2 09/18/2022   PHOS 4.3 09/18/2022    Bone Lab Results  Component Value Date   VD25OH 14.0 (L) 06/15/2022   25OHVITD1 67 08/16/2018   25OHVITD2 <1.0 08/16/2018   25OHVITD3 67 08/16/2018   TESTOSTERONE  1 (L) 08/13/2020    Inflammation (CRP: Acute Phase) (ESR: Chronic Phase) Lab Results  Component Value Date   CRP 2 08/16/2018   ESRSEDRATE 37 (H) 08/16/2018   LATICACIDVEN 1.9 09/16/2022         Note: Above Lab results reviewed.  Recent Imaging  Review  MR LUMBAR SPINE WO CONTRAST CLINICAL DATA:  Chronic low back and right leg pain.  EXAM: MRI LUMBAR SPINE WITHOUT CONTRAST  TECHNIQUE: Multiplanar, multisequence MR imaging of the lumbar spine was performed. No intravenous contrast was administered.  COMPARISON:  Prior MRI from 2019  FINDINGS: Segmentation: There are five lumbar type vertebral bodies. The last full intervertebral disc space is labeled L5-S1. This correlates with the prior MRI examination.  Alignment: Bilateral pars defects at L5 with  stable anterolisthesis. The other lumbar vertebral bodies are normally aligned.  Vertebrae: Stable large L3 hemangioma. No worrisome bone lesions or fractures.  Conus medullaris and cauda equina: Conus extends to the L1 level. Conus and cauda equina appear normal.  Paraspinal and other soft tissues: No significant paraspinal or retroperitoneal findings.  Disc levels:  T12-L1: No significant findings.  L1-2: Very small central disc protrusion no neural compression. No spinal or foraminal stenosis. Mild facet disease.  L2-3: Mild facet disease but no disc protrusions, spinal or foraminal stenosis.  L3-4: Moderate facet disease but no disc protrusions, spinal or foraminal stenosis.  L4-5: Annular fissure and shallow left paracentral disc protrusion contacting the left side of the thecal sac and potentially irritating the left L5 nerve root. The exiting L4 nerve roots are normal. Moderate to advanced right-sided facet disease.  L5-S1: Bilateral pars defects and associated severe bilateral facet disease, right greater than left with mild mass effect on the posterolateral aspect of the thecal sac. Diffuse bulging uncovered disc and mild osteophytic ridging but the spinal canal is generous and there is no significant spinal stenosis. Mild foraminal stenosis bilaterally.  IMPRESSION: 1. Bilateral pars defects at L5 with stable anterolisthesis. Associated stable  appearing severe bilateral facet disease at L5-S1, right greater than left. 2. Annular fissure and shallow left paracentral disc protrusion at L4-5 contacting the left side of the thecal sac and potentially irritating the left L5 nerve root. 3. Mild foraminal stenosis bilaterally at L5-S1.  Electronically Signed   By: Marrian Siva M.D.   On: 11/05/2023 14:42 Note: Reviewed         Physical Exam  General appearance: Well nourished, well developed, and well hydrated. In no apparent acute distress Mental status: Alert, oriented x 3 (person, place, & time)       Respiratory: No evidence of acute respiratory distress Eyes: PERLA Vitals: BP (!) 138/100   Pulse 99   Temp 97.9 F (36.6 C)   Resp 16   Ht 5\' 4"  (1.626 m)   Wt 175 lb (79.4 kg)   LMP 12/01/2020 (Approximate)   SpO2 95%   BMI 30.04 kg/m  BMI: Estimated body mass index is 30.04 kg/m as calculated from the following:   Height as of this encounter: 5\' 4"  (1.626 m).   Weight as of this encounter: 175 lb (79.4 kg). Ideal: Ideal body weight: 54.7 kg (120 lb 9.5 oz) Adjusted ideal body weight: 64.6 kg (142 lb 5.7 oz)  Musculoskeletal: Low back pain worse with weight bearing and walking   Assessment   Diagnosis Status  1. Lumbar radiculitis (L1/L2) (Right)   2. Lumbosacral radiculopathy at L3 (Right)   3. Lumbosacral radiculopathy at L2 (Right)   4. Chronic low back pain (1ry area of Pain) (Bilateral) (R>L) w/o sciatica   5. Chronic sacroiliac joint pain (2ry area of Pain) (Right)   6. Lumbar facet syndrome (Bilateral)   7. Chronic hip pain (Bilateral)   8. Chronic pain syndrome   9. Pharmacologic therapy   10. Chronic use of opiate for therapeutic purpose   11. Encounter for medication management   12. Encounter for chronic pain management   13. Chronic lower extremity pain (Right)   14. DDD (degenerative disc disease), lumbosacral w/ LBP and LEP   15. L5-S1 pars defect w/ spondylolisthesis (Bilateral)   16. Low  back pain of over 3 months duration   17. Lumbar Grade 1 Retrolisthesis (2 mm) of L2/L3    Having a Flare-up Having  a Flare-up Having a Flare-up   Plan of Care  Assessment and Plan Will continue on current medication regimen.  Prescribing drug monitoring (PDMP) reviewed; findings consistent with prescribed medication use and no evidence of narcotic misuse or abuse.  Urine drug screening (UDS) up to date.  Schedule follow-up visit in 90 days with Kristan Votta, NP.   Given flareup of lumbar pain that was previously responsive to the L2-L3 lumbar epidural steroid injection on September 14, 2023, we discussed repeating L2-3 lumbar epidural steroid injection (LESI) # 2 and patient would like to proceed with the plan.   Interventional Therapies  Risk  Complexity Considerations:   WNL    Planned  Pending:   Diagnostic right L2-3 LESI #2 (PRN)     Under consideration:   Diagnostic right L2-3 LESI #2  Therapeutic bilateral lumbar facet medial branch RFA #3     Completed:   Diagnostic right L2-3 LESI x1 (09/14/2023) (3-0/10)  Therapeutic bilateral IA hip + bilateral trochanteric bursa inj. R4L3 (06/27/2023) (100/100/70/70)  Therapeutic bilateral lumbar facet MBB x3 (09/08/2020) (4-0) (100/100/60/60)  Therapeutic right lumbar facet RFA x2 (09/09/2021) (4-2) (100/100/90/>75)  Therapeutic left lumbar facet RFA x2 (10/07/2021) (4-0) (100/100/90/>75)  Therapeutic right IA hip inj. x2 (08/20/2020) (5-1) (100/100/80/80)  Therapeutic left IA hip inj. x1 (08/20/2020) (5-1) (100/100/80/80)  Therapeutic bilateral trochanteric bursa inj. x3 (04/07/2022)  (100/100/90/90)  Neurosurgery consult for evaluation of dynamic lumbar spondylosis and possible surgical alternatives (09/20/2023)    Therapeutic  Palliative (PRN) options:   Palliative lumbar facet MBB   Palliative lumbar facet RFA  Palliative trochanteric bursa inj.     Pharmacotherapy  Nonopioids transferred 05/11/2020: Baclofen  and Mobic          Pharmacotherapy (Medications Ordered): Meds ordered this encounter  Medications   HYDROcodone -acetaminophen  (NORCO) 7.5-325 MG tablet    Sig: Take 1 tablet by mouth every 8 (eight) hours as needed for severe pain (pain score 7-10). Must last 30 days.    Dispense:  90 tablet    Refill:  0    DO NOT: delete (not duplicate); no partial-fill (will deny script to complete), no refill request (F/U required). DISPENSE: 1 day early if closed on fill date. WARN: No CNS-depressants within 8 hrs of med.   HYDROcodone -acetaminophen  (NORCO) 7.5-325 MG tablet    Sig: Take 1 tablet by mouth every 8 (eight) hours as needed for severe pain (pain score 7-10). Must last 30 days.    Dispense:  90 tablet    Refill:  0    DO NOT: delete (not duplicate); no partial-fill (will deny script to complete), no refill request (F/U required). DISPENSE: 1 day early if closed on fill date. WARN: No CNS-depressants within 8 hrs of med.   HYDROcodone -acetaminophen  (NORCO) 7.5-325 MG tablet    Sig: Take 1 tablet by mouth every 8 (eight) hours as needed for severe pain (pain score 7-10). Must last 30 days.    Dispense:  90 tablet    Refill:  0    DO NOT: delete (not duplicate); no partial-fill (will deny script to complete), no refill request (F/U required). DISPENSE: 1 day early if closed on fill date. WARN: No CNS-depressants within 8 hrs of med.   Orders:  Orders Placed This Encounter  Procedures   Lumbar Epidural Injection    Standing Status:   Future    Expiration Date:   03/19/2024    Scheduling Instructions:     Procedure: Interlaminar Lumbar Epidural Steroid injection (LESI)  L2-3  Laterality: Right-sided     Sedation: With Sedation.     Timeframe: As soon as schedule allows.    Where will this procedure be performed?:   [facility]   Follow-up plan:   Return for (ECT): (R) L2-3 LESI #2.        Recent Visits Date Type Provider Dept  09/20/23 Office Visit Renaldo Caroli, MD [facility]  Showing recent visits within past 90 days and meeting all other requirements Today's Visits Date Type Provider Dept  12/18/23 Office Visit Trudi Morgenthaler K, NP [facility]  Showing today's visits and meeting all other requirements Future Appointments No visits were found meeting these conditions. Showing future appointments within next 90 days and meeting all other requirements   I discussed the assessment and treatment plan with the patient. The patient was provided an opportunity to ask questions and all were answered. The patient agreed with the plan and demonstrated an understanding of the instructions.  Patient advised to call back or seek an in-person evaluation if the symptoms or condition worsens.  Duration of encounter: 30 minutes.  Total time on encounter, as per AMA guidelines included both the face-to-face and non-face-to-face time personally spent by the physician and/or other qualified health care professional(s) on the day of the encounter (includes time in activities that require the physician or other qualified health care professional and does not include time in activities normally performed by clinical staff). Physician's time may include the following activities when performed: Preparing to see the patient (e.g., pre-charting review of records, searching for previously ordered imaging, lab work, and nerve conduction tests) Review of prior analgesic pharmacotherapies. Reviewing PMP Interpreting ordered tests (e.g., lab work, imaging, nerve conduction tests) Performing post-procedure evaluations, including interpretation of diagnostic procedures Obtaining and/or reviewing separately obtained history Performing a medically appropriate examination and/or evaluation Counseling and educating the patient/family/caregiver Ordering medications, tests, or procedures Referring and communicating with other health care professionals (when not separately  reported) Documenting clinical information in the electronic or other health record Independently interpreting results (not separately reported) and communicating results to the patient/ family/caregiver Care coordination (not separately reported)  Note by: Tylen Leverich K Hero Kulish, NP/ Francisco A Naveira, MD (TTS and AI technology used. I apologize for any typographical errors that were not detected and corrected.) Date: 12/18/2023; Time: 10:43 AM

## 2023-12-20 ENCOUNTER — Encounter: Payer: BC Managed Care – PPO | Admitting: Pain Medicine

## 2023-12-26 ENCOUNTER — Encounter: Payer: Self-pay | Admitting: Family Medicine

## 2023-12-26 ENCOUNTER — Other Ambulatory Visit: Payer: Self-pay

## 2023-12-26 ENCOUNTER — Ambulatory Visit: Admitting: Family Medicine

## 2023-12-26 VITALS — BP 121/79 | HR 86 | Ht 64.0 in | Wt 186.0 lb

## 2023-12-26 DIAGNOSIS — L710 Perioral dermatitis: Secondary | ICD-10-CM | POA: Diagnosis not present

## 2023-12-26 DIAGNOSIS — E782 Mixed hyperlipidemia: Secondary | ICD-10-CM | POA: Diagnosis not present

## 2023-12-26 DIAGNOSIS — R7303 Prediabetes: Secondary | ICD-10-CM

## 2023-12-26 DIAGNOSIS — L723 Sebaceous cyst: Secondary | ICD-10-CM | POA: Diagnosis not present

## 2023-12-26 DIAGNOSIS — G4733 Obstructive sleep apnea (adult) (pediatric): Secondary | ICD-10-CM | POA: Insufficient documentation

## 2023-12-26 MED ORDER — DOXYCYCLINE HYCLATE 100 MG PO TABS
ORAL_TABLET | ORAL | 0 refills | Status: AC
Start: 2023-12-26 — End: 2024-02-25
  Filled 2023-12-26: qty 45, 60d supply, fill #0

## 2023-12-26 MED ORDER — ROSUVASTATIN CALCIUM 5 MG PO TABS
5.0000 mg | ORAL_TABLET | Freq: Every day | ORAL | 3 refills | Status: AC
  Filled 2023-12-26: qty 90, 90d supply, fill #0
  Filled 2024-02-16 – 2024-03-20 (×2): qty 90, 90d supply, fill #1
  Filled 2024-06-26: qty 90, 90d supply, fill #2

## 2023-12-26 NOTE — Assessment & Plan Note (Signed)
-   A1C >6.0, trending upward    - Rechecking A1C today    - Discussed GLP-1 medication options (Wegovy, Zepbound)    - Patient to check insurance coverage and requirements    - Follow-up in 3 months

## 2023-12-26 NOTE — Assessment & Plan Note (Addendum)
-   Diagnosed approximately 10 years ago in [place]    - Has CPAP but not currently using    - May help qualify for GLP-1 medication coverage

## 2023-12-26 NOTE — Patient Instructions (Addendum)
 It was nice to see you today,  We addressed the following topics today: -I am going to send in a referral to the dermatologist.  Someone should call you to schedule this  - For the GLP-1 medication I would like you to call your insurance company and ask them the following questions: 1.  Do you would cover GLP-1 a medication for nondiabetics? 2..  What conditions are they covered for and what medications (Zepbound or Wegovy) 3..  Are there any dietary and exercise documentation requirements?  - I have sent in doxycycline to take 100 mg a day for 1 month, then every other day for another month. - I have sent in Crestor.  Take this every day.  Have a great day,  Etha Henle, MD

## 2023-12-26 NOTE — Progress Notes (Signed)
 Established Patient Office Visit  Subjective   Patient ID: Denise Burns, female    DOB: [birth year]  Age: 53 y.o. MRN: [id number]  Chief Complaint  Patient presents with   Referral    HPI  Subjective - Perioral dermatitis since February, likely triggered by trying a different skin product - Previously treated with oral antibiotics (possibly doxycycline  or minocycline) which worked better than topical treatments - Has been using an unspecified lotion that helps temporarily but condition flares with lip balm use - Interested in GLP-1 medication for weight management and blood sugar control - Reports difficulty losing weight, especially with steroid injections for back pain - Has Addison's disease, takes steroids which slightly increase appetite - Recently trying to make better dietary choices, eating less carbohydrates - Back pain: gets temporary relief from injections, referred to surgeon, considering surgery later this year  Medications: Steroid medication for Addison's disease, unspecified lotion for perioral dermatitis  PMH, PSH, FH, Social Hx: Addison's disease managed by endocrinologist, pre-diabetes with A1C trending upward, diagnosed sleep apnea approximately 10 years ago with CPAP not currently used, back issues requiring injections, family history of hypercholesterolemia with good statin tolerance  ROS: Denies worsening back pain   Body mass index is 31.93 kg/m.    The 10-year ASCVD risk score (Arnett DK, et al., 2019) is: 2.2%  Health Maintenance Due  Topic Date Due   Hepatitis C Screening  Never done   DTaP/Tdap/Td (1 - Tdap) Never done   Cervical Cancer Screening (HPV/Pap Cotest)  06/18/2021   COVID-19 Vaccine (4 - 2024-25 season) 04/02/2023      Objective:     BP 121/79   Pulse 86   Ht 5\' 4"  (1.626 m)   Wt 186 lb 0.6 oz (84.4 kg)   LMP 12/01/2020 (Approximate)   SpO2 94%   BMI 31.93 kg/m    Physical Exam Gen: alert, oriented Skin: 8mm  flesh colored papule on the skin above the upper lip on the left side Psych: pleasant affect.    No results found for any visits on 12/26/23.      Assessment & Plan:   Mixed hyperlipidemia Assessment & Plan:    - LDL >190, strong genetic component    - Starting rosuvastatin  5mg  daily    - Counseled on side effects including muscle aches, rare liver injury    - Goal LDL <100    - Labs in 2 months, follow-up in 3 months  Orders: -     Lipid panel -     Hemoglobin A1c  Prediabetes Assessment & Plan:    - A1C >6.0, trending upward    - Rechecking A1C today    - Discussed GLP-1 medication options (Wegovy, Zepbound)    - Patient to check insurance coverage and requirements    - Follow-up in 3 months  Orders: -     Hemoglobin A1c  Perioral dermatitis Assessment & Plan:    - Recurrent since February, previously treated with oral antibiotics    - Will prescribe oral antibiotic (likely doxycycline )    - Referral to dermatology  Orders: -     Ambulatory referral to Dermatology  Sebaceous cyst -     Ambulatory referral to Dermatology  OSA (obstructive sleep apnea) Assessment & Plan:    - Diagnosed approximately 10 years ago in [place]    - Has CPAP but not currently using    - May help qualify for GLP-1 medication coverage   Other orders -  Doxycycline Hyclate; Take 1 tablet (100 mg total) by mouth daily for 30 days, THEN 1 tablet (100 mg total) every other day.  Dispense: 45 tablet; Refill: 0 -     Rosuvastatin Calcium; Take 1 tablet (5 mg total) by mouth daily.  Dispense: 90 tablet; Refill: 3     Return in about 3 months (around 03/27/2024) for hld.    Laneta Pintos, MD

## 2023-12-26 NOTE — Assessment & Plan Note (Signed)
-   Recurrent since February, previously treated with oral antibiotics    - Will prescribe oral antibiotic (likely doxycycline )    - Referral to dermatology

## 2023-12-26 NOTE — Assessment & Plan Note (Signed)
-   LDL >190, strong genetic component    - Starting rosuvastatin 5mg  daily    - Counseled on side effects including muscle aches, rare liver injury    - Goal LDL <100    - Labs in 2 months, follow-up in 3 months

## 2023-12-27 ENCOUNTER — Ambulatory Visit: Payer: Self-pay | Admitting: Family Medicine

## 2023-12-27 LAB — LIPID PANEL
Chol/HDL Ratio: 5.3 ratio — ABNORMAL HIGH (ref 0.0–4.4)
Cholesterol, Total: 280 mg/dL — ABNORMAL HIGH (ref 100–199)
HDL: 53 mg/dL (ref >39)
LDL Chol Calc (NIH): 198 mg/dL — ABNORMAL HIGH (ref 0–99)
Triglycerides: 156 mg/dL — ABNORMAL HIGH (ref 0–149)
VLDL Cholesterol Cal: 29 mg/dL (ref 5–40)

## 2023-12-27 LAB — HEMOGLOBIN A1C
Est. average glucose Bld gHb Est-mCnc: 128 mg/dL
Hgb A1c MFr Bld: 6.1 % — ABNORMAL HIGH (ref 4.8–5.6)

## 2024-01-09 ENCOUNTER — Ambulatory Visit: Admitting: Orthopedic Surgery

## 2024-01-17 ENCOUNTER — Encounter: Payer: Self-pay | Admitting: Nurse Practitioner

## 2024-01-17 ENCOUNTER — Ambulatory Visit: Attending: Nurse Practitioner | Admitting: Nurse Practitioner

## 2024-01-17 VITALS — BP 141/83 | HR 84 | Temp 97.9°F | Ht 64.0 in | Wt 178.0 lb

## 2024-01-17 DIAGNOSIS — M5416 Radiculopathy, lumbar region: Secondary | ICD-10-CM | POA: Diagnosis not present

## 2024-01-17 DIAGNOSIS — M5417 Radiculopathy, lumbosacral region: Secondary | ICD-10-CM | POA: Insufficient documentation

## 2024-01-17 DIAGNOSIS — M47816 Spondylosis without myelopathy or radiculopathy, lumbar region: Secondary | ICD-10-CM | POA: Insufficient documentation

## 2024-01-17 DIAGNOSIS — G894 Chronic pain syndrome: Secondary | ICD-10-CM | POA: Insufficient documentation

## 2024-01-17 DIAGNOSIS — M25551 Pain in right hip: Secondary | ICD-10-CM | POA: Insufficient documentation

## 2024-01-17 DIAGNOSIS — R29898 Other symptoms and signs involving the musculoskeletal system: Secondary | ICD-10-CM | POA: Insufficient documentation

## 2024-01-17 DIAGNOSIS — M4726 Other spondylosis with radiculopathy, lumbar region: Secondary | ICD-10-CM

## 2024-01-17 DIAGNOSIS — G8929 Other chronic pain: Secondary | ICD-10-CM | POA: Diagnosis not present

## 2024-01-17 DIAGNOSIS — M25552 Pain in left hip: Secondary | ICD-10-CM | POA: Diagnosis not present

## 2024-01-17 MED ORDER — KETOROLAC TROMETHAMINE 60 MG/2ML IM SOLN
60.0000 mg | Freq: Once | INTRAMUSCULAR | Status: AC
  Administered 2024-01-17: 60 mg via INTRAMUSCULAR
  Filled 2024-01-17: qty 2

## 2024-01-17 MED ORDER — METHOCARBAMOL 1000 MG/10ML IJ SOLN
200.0000 mg | Freq: Once | INTRAMUSCULAR | Status: AC
  Administered 2024-01-17: 200 mg via INTRAMUSCULAR
  Filled 2024-01-17: qty 10

## 2024-01-17 NOTE — Progress Notes (Signed)
 Safety precautions to be maintained throughout the outpatient stay will include: orient to surroundings, keep bed in low position, maintain call bell within reach at all times, provide assistance with transfer out of bed and ambulation.

## 2024-01-17 NOTE — Progress Notes (Signed)
 PROVIDER NOTE: Interpretation of information contained herein should be left to medically-trained personnel. Specific patient instructions are provided elsewhere under Patient Instructions section of medical record. This document was created in part using AI and STT-dictation technology, any transcriptional errors that may result from this process are unintentional.  Patient: Denise Burns  Service: E/M   PCP: Laneta Pintos, MD  DOB: Jul 02, 1971  DOS: 01/17/2024  Provider: Cherylin Corrigan, NP  MRN: [id number]  Delivery: Face-to-face  Specialty: Interventional Pain Management  Type: Established Patient  Setting: Ambulatory outpatient facility  Specialty designation: 09  Referring Prov.: Laneta Pintos, MD  Location: Outpatient office facility       History of present illness (HPI) [name], a 53 y.o. year old female, is here today because of her Chronic pain syndrome [G89.4]. [name]'s primary complain today is Back Pain (Buttocks, hips and thighs)  Pertinent problems: Denise Burns has Chronic midline low back pain with bilateral sciatica; Chronic low back pain(1ry area of pain) (Bilateral) (R>L) w/o sciatica; Lumbar facet hypertrophy (multilevel) (Bilateral); Lumbar facet syndrome; Chronic musculoskeletal pain; Neurogenic pain; Greater trochanteric bursitis (Bilateral); Chronic hip pain (Right); Osteoarthritis of hip (Right); Chronic hip pain (Bilateral); and Chronic pain syndrome on their pertinent problem list.  Pain Assessment: Severity of Chronic pain is reported as a 5 /10. Location: Back Lower/radiates to buttocks and bilateral hips and thighs. Onset: More than a month ago. Quality: Aching, Burning. Timing: Constant. Modifying factor(s): meds, hot baths. Vitals:  height is 5' 4 (1.626 m) and weight is 178 lb (80.7 kg). Her temperature is 97.9 F (36.6 C). Her blood pressure is 141/83 (abnormal) and her pulse is 84. Her oxygen saturation is 99%.  BMI: Estimated body mass  index is 30.55 kg/m as calculated from the following:   Height as of this encounter: 5' 4 (1.626 m).   Weight as of this encounter: 178 lb (80.7 kg).  Last encounter: 12/18/2023. Last procedure: 09/14/2023  Reason for encounter: evaluation of worsening, or previously known (established) problem.  The patient presents with the complaints of bilateral low back pain that radiates to both hips, extending down to the lateral aspect of the left leg and the anterior aspect of the thigh.   Pharmacotherapy Assessment   Monitoring: [place] PMP: PDMP reviewed during this encounter.       Pharmacotherapy: No side-effects or adverse reactions reported. Compliance: No problems identified. Effectiveness: Clinically acceptable.  Merilyn Staple, RN  01/17/2024  9:05 AM  Sign when Signing Visit Safety precautions to be maintained throughout the outpatient stay will include: orient to surroundings, keep bed in low position, maintain call bell within reach at all times, provide assistance with transfer out of bed and ambulation.     UDS:  Summary  Date Value Ref Range Status  03/22/2023 Note  Final    Comment:    ==================================================================== [employer] (MW) ==================================================================== Test                             Result       Flag       Units  Drug Present and Declared for Prescription Verification   Hydrocodone                     1894         EXPECTED   ng/mg creat   Hydromorphone   329          EXPECTED   ng/mg creat   Dihydrocodeine                 292          EXPECTED   ng/mg creat   Norhydrocodone                 1758         EXPECTED   ng/mg creat    Sources of hydrocodone  include scheduled prescription medications.    Hydromorphone , dihydrocodeine and norhydrocodone are expected    metabolites of hydrocodone . Hydromorphone  and dihydrocodeine are    also available as scheduled  prescription medications.    Oxymorphone                    335          EXPECTED   ng/mg creat   Noroxymorphone                 134          EXPECTED   ng/mg creat    Sources of oxymorphone are scheduled prescription medications;    oxymorphone is also a metabolite of oxycodone . Noroxymorphone is an    expected metabolite of oxymorphone.  Drug Absent but Declared for Prescription Verification   Oxycodone                       Not Detected UNEXPECTED ng/mg creat    Oxycodone  is almost always present in patients taking this drug    consistently.  Absence of oxycodone  could be due to lapse of time    since the last dose or unusual pharmacokinetics (rapid metabolism).  ==================================================================== Test                      Result    Flag   Units      Ref Range   Creatinine              65               mg/dL      >=40 ==================================================================== Declared Medications:  The flagging and interpretation on this report are based on the  following declared medications.  Unexpected results may arise from  inaccuracies in the declared medications.   **Note: The testing scope of this panel includes these medications:   Hydrocodone  (Norco)  Oxycodone  (Percocet)   **Note: The testing scope of this panel does not include the  following reported medications:   Acetaminophen  (Norco)  Acetaminophen  (Percocet)  Azelastine   Cetirizine (Zyrtec)  Escitalopram  (Lexapro )  Fludrocortisone  (Florinef )  Fluticasone  (Flonase )  Hydrocortisone  (Cortef )  Hyoscyamine  Lansoprazole  (Prevacid )  Meloxicam  (Mobic )  Methenamine   Methylene Blue  Naloxone  (Narcan )  Omega-3 Fatty Acids  Pseudoephedrine  (Sudafed)  Supplement  Vitamin D2 ==================================================================== For clinical consultation, please call [phone]. ====================================================================      No results found for: CBDTHCR No results found for: D8THCCBX No results found for: D9THCCBX  ROS  Constitutional: Denies any fever or chills Gastrointestinal: No reported hemesis, hematochezia, vomiting, or acute GI distress Musculoskeletal: Low back pain, bilateral hip pain radiate to anterior aspect of thigh (L>R) Neurological: No reported episodes of acute onset apraxia, aphasia, dysarthria, agnosia, amnesia, paralysis, loss of coordination, or loss of consciousness  Medication Review  Azelastine  HCl, HYDROcodone -acetaminophen , Urogesic-Blue, cetirizine, doxycycline , escitalopram , fludrocortisone , fluticasone , hydrocortisone , lansoprazole , meloxicam , naloxone , omega-3 acid ethyl esters, pseudoephedrine ,  rosuvastatin , and tiZANidine   History Review  Allergy: Denise Burns is allergic to tramadol, lisinopril, and sulfa antibiotics. Drug: Denise Burns  reports no history of drug use. Alcohol:  reports that she does not currently use alcohol. Tobacco:  reports that she quit smoking about 29 years ago. Her smoking use included cigarettes. She has never used smokeless tobacco. Social: Denise Burns  reports that she quit smoking about 29 years ago. Her smoking use included cigarettes. She has never used smokeless tobacco. She reports that she does not currently use alcohol. She reports that she does not use drugs. Medical:  has a past medical history of Addison disease (HCC), Arthritis, Back pain, Complication of anesthesia, GERD (gastroesophageal reflux disease), and History of kidney stones. Surgical: Denise Burns  has a past surgical history that includes Cholecystectomy (2010); Polypectomy; dental work; Dilation and curettage of uterus (2010); IR NEPHROSTOMY PLACEMENT LEFT (12/14/2020); Nephrolithotomy (Left, 12/14/2020); Wisdom tooth extraction; Diagnostic laparoscopy (2004); Cystoscopy/ureteroscopy/holmium laser/stent placement (Right, 06/28/2021); Cystoscopy w/ ureteral stent  removal (Right, 08/09/2021); Cystoscopy w/ retrogrades (Right, 08/09/2021); Cystoscopy/ureteroscopy/holmium laser/stent placement (Right, 08/09/2021); and Colonoscopy with propofol  (N/A, 06/08/2022). Family: family history includes Alcohol abuse in her father; Arthritis in her mother; Brain cancer in her mother; Colon polyps (age of onset: 56) in her maternal grandmother; Hearing loss in her father; High blood pressure in her father and mother; Hyperlipidemia in her father and mother; Hypertension in her mother; Liver cancer in her mother; Lung cancer in her mother.  Laboratory Chemistry Profile   Renal Lab Results  Component Value Date   BUN 18 09/12/2023   CREATININE 0.91 09/12/2023   BCR 20 09/12/2023   GFR 84.36 08/26/2021   GFRAA 86 08/16/2018   GFRNONAA >60 09/18/2022    Hepatic Lab Results  Component Value Date   AST 25 09/12/2023   ALT 31 09/12/2023   ALBUMIN 4.4 09/12/2023   ALKPHOS 68 09/12/2023   LIPASE 38 09/16/2022    Electrolytes Lab Results  Component Value Date   NA 142 09/12/2023   K 3.9 09/12/2023   CL 104 09/12/2023   CALCIUM  9.5 09/12/2023   MG 2.2 09/18/2022   PHOS 4.3 09/18/2022    Bone Lab Results  Component Value Date   VD25OH 14.0 (L) 06/15/2022   25OHVITD1 67 08/16/2018   25OHVITD2 <1.0 08/16/2018   25OHVITD3 67 08/16/2018   TESTOSTERONE  1 (L) 08/13/2020    Inflammation (CRP: Acute Phase) (ESR: Chronic Phase) Lab Results  Component Value Date   CRP 2 08/16/2018   ESRSEDRATE 37 (H) 08/16/2018   LATICACIDVEN 1.9 09/16/2022         Note: Above Lab results reviewed.  Recent Imaging Review  MR LUMBAR SPINE WO CONTRAST CLINICAL DATA:  Chronic low back and right leg pain.  EXAM: MRI LUMBAR SPINE WITHOUT CONTRAST  TECHNIQUE: Multiplanar, multisequence MR imaging of the lumbar spine was performed. No intravenous contrast was administered.  COMPARISON:  Prior MRI from 2019  FINDINGS: Segmentation: There are five lumbar type vertebral bodies.  The last full intervertebral disc space is labeled L5-S1. This correlates with the prior MRI examination.  Alignment: Bilateral pars defects at L5 with stable anterolisthesis. The other lumbar vertebral bodies are normally aligned.  Vertebrae: Stable large L3 hemangioma. No worrisome bone lesions or fractures.  Conus medullaris and cauda equina: Conus extends to the L1 level. Conus and cauda equina appear normal.  Paraspinal and other soft tissues: No significant paraspinal or retroperitoneal findings.  Disc levels:  T12-L1: No significant findings.  L1-2: Very small central disc protrusion no neural compression. No spinal or foraminal stenosis. Mild facet disease.  L2-3: Mild facet disease but no disc protrusions, spinal or foraminal stenosis.  L3-4: Moderate facet disease but no disc protrusions, spinal or foraminal stenosis.  L4-5: Annular fissure and shallow left paracentral disc protrusion contacting the left side of the thecal sac and potentially irritating the left L5 nerve root. The exiting L4 nerve roots are normal. Moderate to advanced right-sided facet disease.  L5-S1: Bilateral pars defects and associated severe bilateral facet disease, right greater than left with mild mass effect on the posterolateral aspect of the thecal sac. Diffuse bulging uncovered disc and mild osteophytic ridging but the spinal canal is generous and there is no significant spinal stenosis. Mild foraminal stenosis bilaterally.  IMPRESSION: 1. Bilateral pars defects at L5 with stable anterolisthesis. Associated stable appearing severe bilateral facet disease at L5-S1, right greater than left. 2. Annular fissure and shallow left paracentral disc protrusion at L4-5 contacting the left side of the thecal sac and potentially irritating the left L5 nerve root. 3. Mild foraminal stenosis bilaterally at L5-S1.  Electronically Signed   By: Marrian Siva M.D.   On: 11/05/2023 14:42 Note:  Reviewed        Physical Exam  General appearance: Well nourished, well developed, and well hydrated. In no apparent acute distress Mental status: Alert, oriented x 3 (person, place, & time)       Respiratory: No evidence of acute respiratory distress Eyes: PERLA Vitals: BP (!) 141/83   Pulse 84   Temp 97.9 F (36.6 C)   Ht 5' 4 (1.626 m)   Wt 178 lb (80.7 kg)   LMP 12/01/2020 (Approximate)   SpO2 99%   BMI 30.55 kg/m  BMI: Estimated body mass index is 30.55 kg/m as calculated from the following:   Height as of this encounter: 5' 4 (1.626 m).   Weight as of this encounter: 178 lb (80.7 kg). Ideal: Ideal body weight: 54.7 kg (120 lb 9.5 oz) Adjusted ideal body weight: 65.1 kg (143 lb 8.9 oz)  Musculoskeletal: Low back pain and hip pain worse with sitting, standing, walking  Assessment   Diagnosis Status  1. Chronic pain syndrome   2. Chronic hip pain (Bilateral)   3. Lumbar radiculitis (L1/L2) (Right)   4. Lumbosacral radiculopathy at L3 (Right)   5. Lumbosacral radiculopathy at L2 (Right)   6. Lumbar facet syndrome (Bilateral)   7. Weakness of lower extremity    Controlled Controlled Controlled   Updated Problems: No problems updated.  Plan of Care  Problem-specific:  Assessment and Plan Toradol  IM 60 Mg/2ML and methocarbamol (Robaxin) 200 mg IM given at clinic to manage her pain.    Interventional Therapies  Risk  Complexity Considerations:   WNL    Planned  Pending:   Diagnostic right L2-3 LESI #2 (PRN)     Under consideration:   Diagnostic right L2-3 LESI #2  Therapeutic bilateral lumbar facet medial branch RFA #3     Completed:   Diagnostic right L2-3 LESI x1 (09/14/2023) (3-0/10)  Therapeutic bilateral IA hip + bilateral trochanteric bursa inj. R4L3 (06/27/2023) (100/100/70/70)  Therapeutic bilateral lumbar facet MBB x3 (09/08/2020) (4-0) (100/100/60/60)  Therapeutic right lumbar facet RFA x2 (09/09/2021) (4-2) (100/100/90/>75)  Therapeutic  left lumbar facet RFA x2 (10/07/2021) (4-0) (100/100/90/>75)  Therapeutic right IA hip inj. x2 (08/20/2020) (5-1) (100/100/80/80)  Therapeutic left IA hip inj. x1 (08/20/2020) (5-1) (100/100/80/80)  Therapeutic bilateral trochanteric bursa inj. x3 (  04/07/2022)  (100/100/90/90)  Neurosurgery consult for evaluation of dynamic lumbar spondylosis and possible surgical alternatives (09/20/2023)    Therapeutic  Palliative (PRN) options:   Palliative lumbar facet MBB   Palliative lumbar facet RFA  Palliative trochanteric bursa inj.     Pharmacotherapy  Nonopioids transferred 05/11/2020: Baclofen  and Mobic      Denise Burns has a current medication list which includes the following long-term medication(s): azelastine  hcl, escitalopram , flonase  sensimist, hydrocodone -acetaminophen , [START ON 01/23/2024] hydrocodone -acetaminophen , [START ON 02/22/2024] hydrocodone -acetaminophen , lansoprazole , meloxicam , omega-3 acid ethyl esters, and rosuvastatin .  Pharmacotherapy (Medications Ordered): Meds ordered this encounter  Medications   ketorolac  (TORADOL ) injection 60 mg   methocarbamol (ROBAXIN) injection 200 mg   Orders:  No orders of the defined types were placed in this encounter.       No follow-ups on file.    Recent Visits Date Type Provider Dept  12/18/23 Office Visit Petr Bontempo K, NP [facility]  Showing recent visits within past 90 days and meeting all other requirements Today's Visits Date Type Provider Dept  01/17/24 Office Visit Eliabeth Shoff K, NP [facility]  Showing today's visits and meeting all other requirements Future Appointments Date Type Provider Dept  01/23/24 Appointment Renaldo Caroli, MD [facility]  03/19/24 Appointment Ankur Snowdon K, NP [facility]  Showing future appointments within next 90 days and meeting all other requirements  I discussed the assessment and treatment plan with the patient. The patient was  provided an opportunity to ask questions and all were answered. The patient agreed with the plan and demonstrated an understanding of the instructions.  Patient advised to call back or seek an in-person evaluation if the symptoms or condition worsens.  Duration of encounter: 20 minutes.  Total time on encounter, as per AMA guidelines included both the face-to-face and non-face-to-face time personally spent by the physician and/or other qualified health care professional(s) on the day of the encounter (includes time in activities that require the physician or other qualified health care professional and does not include time in activities normally performed by clinical staff). Physician's time may include the following activities when performed: Preparing to see the patient (e.g., pre-charting review of records, searching for previously ordered imaging, lab work, and nerve conduction tests) Review of prior analgesic pharmacotherapies. Reviewing PMP Interpreting ordered tests (e.g., lab work, imaging, nerve conduction tests) Performing post-procedure evaluations, including interpretation of diagnostic procedures Obtaining and/or reviewing separately obtained history Performing a medically appropriate examination and/or evaluation Counseling and educating the patient/family/caregiver Ordering medications, tests, or procedures Referring and communicating with other health care professionals (when not separately reported) Documenting clinical information in the electronic or other health record Independently interpreting results (not separately reported) and communicating results to the patient/ family/caregiver Care coordination (not separately reported)  Note by: Duglas Heier K Azalya Galyon, NP (TTS and AI technology used. I apologize for any typographical errors that were not detected and corrected.) Date: 01/17/2024; Time: 9:53 AM

## 2024-01-19 ENCOUNTER — Other Ambulatory Visit: Payer: Self-pay

## 2024-01-23 ENCOUNTER — Ambulatory Visit (HOSPITAL_BASED_OUTPATIENT_CLINIC_OR_DEPARTMENT_OTHER): Admitting: Pain Medicine

## 2024-01-23 ENCOUNTER — Encounter: Payer: Self-pay | Admitting: Pain Medicine

## 2024-01-23 ENCOUNTER — Ambulatory Visit
Admission: RE | Admit: 2024-01-23 | Discharge: 2024-01-23 | Disposition: A | Discharge status: Home / Self Care | Source: Ambulatory Visit | Attending: Pain Medicine | Admitting: Pain Medicine

## 2024-01-23 VITALS — BP 147/97 | HR 104 | Temp 98.4°F | Resp 18 | Ht 64.0 in | Wt 179.0 lb

## 2024-01-23 DIAGNOSIS — M4310 Spondylolisthesis, site unspecified: Secondary | ICD-10-CM | POA: Diagnosis not present

## 2024-01-23 DIAGNOSIS — M545 Low back pain, unspecified: Secondary | ICD-10-CM | POA: Diagnosis not present

## 2024-01-23 DIAGNOSIS — G8929 Other chronic pain: Secondary | ICD-10-CM

## 2024-01-23 DIAGNOSIS — M48061 Spinal stenosis, lumbar region without neurogenic claudication: Secondary | ICD-10-CM | POA: Insufficient documentation

## 2024-01-23 DIAGNOSIS — M5416 Radiculopathy, lumbar region: Secondary | ICD-10-CM | POA: Insufficient documentation

## 2024-01-23 DIAGNOSIS — M5417 Radiculopathy, lumbosacral region: Secondary | ICD-10-CM | POA: Insufficient documentation

## 2024-01-23 DIAGNOSIS — M51372 Other intervertebral disc degeneration, lumbosacral region with discogenic back pain and lower extremity pain: Secondary | ICD-10-CM | POA: Diagnosis not present

## 2024-01-23 DIAGNOSIS — M79604 Pain in right leg: Secondary | ICD-10-CM | POA: Diagnosis not present

## 2024-01-23 MED ORDER — FENTANYL CITRATE (PF) 100 MCG/2ML IJ SOLN
INTRAMUSCULAR | Status: AC
  Filled 2024-01-23: qty 2

## 2024-01-23 MED ORDER — PENTAFLUOROPROP-TETRAFLUOROETH EX AERO
INHALATION_SPRAY | Freq: Once | CUTANEOUS | Status: AC
  Administered 2024-01-23: 30 via TOPICAL

## 2024-01-23 MED ORDER — SODIUM CHLORIDE 0.9% FLUSH
2.0000 mL | Freq: Once | INTRAVENOUS | Status: AC
  Administered 2024-01-23: 2 mL

## 2024-01-23 MED ORDER — MIDAZOLAM HCL 5 MG/5ML IJ SOLN: 0.5000 mg | Freq: Once | INTRAMUSCULAR | Status: DC

## 2024-01-23 MED ORDER — LIDOCAINE HCL 2 % IJ SOLN
20.0000 mL | Freq: Once | INTRAMUSCULAR | Status: AC
  Administered 2024-01-23: 100 mg

## 2024-01-23 MED ORDER — ROPIVACAINE HCL 2 MG/ML IJ SOLN
2.0000 mL | Freq: Once | INTRAMUSCULAR | Status: AC
  Administered 2024-01-23: 2 mL via EPIDURAL

## 2024-01-23 MED ORDER — IOHEXOL 180 MG/ML  SOLN
INTRAMUSCULAR | Status: AC
  Filled 2024-01-23: qty 10

## 2024-01-23 MED ORDER — FENTANYL CITRATE (PF) 100 MCG/2ML IJ SOLN: 25.0000 ug | INTRAMUSCULAR | Status: DC | PRN

## 2024-01-23 MED ORDER — LIDOCAINE HCL (PF) 2 % IJ SOLN
INTRAMUSCULAR | Status: AC
  Filled 2024-01-23: qty 10

## 2024-01-23 MED ORDER — IOHEXOL 180 MG/ML  SOLN
10.0000 mL | Freq: Once | INTRAMUSCULAR | Status: AC
  Administered 2024-01-23: 10 mL via EPIDURAL

## 2024-01-23 MED ORDER — ROPIVACAINE HCL 2 MG/ML IJ SOLN
INTRAMUSCULAR | Status: AC
  Filled 2024-01-23: qty 20

## 2024-01-23 MED ORDER — TRIAMCINOLONE ACETONIDE 40 MG/ML IJ SUSP
INTRAMUSCULAR | Status: AC
  Filled 2024-01-23: qty 1

## 2024-01-23 MED ORDER — TRIAMCINOLONE ACETONIDE 40 MG/ML IJ SUSP
40.0000 mg | Freq: Once | INTRAMUSCULAR | Status: AC
  Administered 2024-01-23: 40 mg

## 2024-01-23 MED ORDER — SODIUM CHLORIDE (PF) 0.9 % IJ SOLN
INTRAMUSCULAR | Status: AC
  Filled 2024-01-23: qty 10

## 2024-01-23 MED ORDER — MIDAZOLAM HCL 5 MG/5ML IJ SOLN
INTRAMUSCULAR | Status: AC
  Filled 2024-01-23: qty 5

## 2024-01-23 NOTE — Progress Notes (Signed)
 Safety precautions to be maintained throughout the outpatient stay will include: orient to surroundings, keep bed in low position, maintain call bell within reach at all times, provide assistance with transfer out of bed and ambulation.

## 2024-01-23 NOTE — Progress Notes (Signed)
 PROVIDER NOTE: Interpretation of information contained herein should be left to medically-trained personnel. Specific patient instructions are provided elsewhere under Patient Instructions section of medical record. This document was created in part using STT-dictation technology, any transcriptional errors that may result from this process are unintentional.  Patient: Denise Burns Type: Established DOB: Jul 18, 1971 MRN: [id number] PCP: Chandra Toribio POUR, MD  Service: Procedure DOS: 01/23/2024 Setting: Ambulatory Location: Ambulatory outpatient facility Delivery: Face-to-face Provider: Eric DELENA Como, MD Specialty: Interventional Pain Management Specialty designation: 09 Location: Outpatient facility Ref. Prov.: Como Eric, MD       Interventional Therapy   Type: Lumbar epidural steroid injection (LESI) (interlaminar) #2    Laterality: Right   Level:  L2-3 Level.  Imaging: Fluoroscopic guidance Spinal ([id number]) Anesthesia: Local anesthesia (1-2% Lidocaine ) Anxiolysis: None                 Sedation: No Sedation                       DOS: 01/23/2024  Performed by: Eric DELENA Como, MD  Purpose: Diagnostic/Therapeutic Indications: Lumbar radicular pain of intraspinal etiology of more than 4 weeks that has failed to respond to conservative therapy and is severe enough to impact quality of life or function. 1. Lumbar radiculitis (L1/L2) (Right)   2. Lumbosacral radiculopathy at L2 (Right)   3. Lumbosacral radiculopathy at L3 (Right)   4. Chronic lower extremity pain (Right)   5. Lumbar Grade 1 Retrolisthesis (2 mm) of L2/L3   6. Lumbar foraminal stenosis (Left: L2-3) (Bilateral: L5-S1)   7. Chronic low back pain (1ry area of Pain) (Bilateral) (R>L) w/o sciatica   8. DDD (degenerative disc disease), lumbosacral w/ LBP and LEP   9. Low back pain of over 3 months duration    NAS-11 Pain score:   Pre-procedure: 5 /10   Post-procedure: 5 /10      Position / Prep  / Materials:  Position: Prone w/ head of the table raised (slight reverse trendelenburg) to facilitate breathing.  Prep solution: ChloraPrep (2% chlorhexidine  gluconate and 70% isopropyl alcohol) Prep Area: Entire Posterior Lumbar Region from lower scapular tip down to mid buttocks area and from flank to flank. Materials:  Tray: Epidural tray Needle(s):  Type: Epidural needle (Tuohy) Gauge (G):  17 Length: Regular (3.5-in) Qty: 1  H&P (Pre-op Assessment):  Denise Burns is a 53 y.o. (year old), female patient, seen today for interventional treatment. She  has a past surgical history that includes Cholecystectomy (2010); Polypectomy; dental work; Dilation and curettage of uterus (2010); IR NEPHROSTOMY PLACEMENT LEFT (12/14/2020); Nephrolithotomy (Left, 12/14/2020); Wisdom tooth extraction; Diagnostic laparoscopy (2004); Cystoscopy/ureteroscopy/holmium laser/stent placement (Right, 06/28/2021); Cystoscopy w/ ureteral stent removal (Right, 08/09/2021); Cystoscopy w/ retrogrades (Right, 08/09/2021); Cystoscopy/ureteroscopy/holmium laser/stent placement (Right, 08/09/2021); and Colonoscopy with propofol  (N/A, 06/08/2022). Denise Burns has a current medication list which includes the following prescription(s): azelastine  hcl, cetirizine, doxycycline , escitalopram , fludrocortisone , flonase  sensimist, hydrocodone -acetaminophen , hydrocodone -acetaminophen , [START ON 02/22/2024] hydrocodone -acetaminophen , hydrocortisone , lansoprazole , meloxicam , urogesic-blue, naloxone , omega-3 acid ethyl esters, pseudoephedrine , rosuvastatin , and tizanidine . Her primarily concern today is the Back Pain  Initial Vital Signs:  Pulse/HCG Rate: (!) 104ECG Heart Rate: 94 Temp: 98.4 F (36.9 C) Resp: 18 BP: (!) 132/94 SpO2: 97 %  BMI: Estimated body mass index is 30.73 kg/m as calculated from the following:   Height as of this encounter: 5' 4 (1.626 m).   Weight as of this encounter: 179 lb (81.2 kg).  Risk  Assessment: Allergies: Reviewed. She is  allergic to tramadol, lisinopril, and sulfa antibiotics.  Allergy Precautions: None required Coagulopathies: Reviewed. None identified.  Blood-thinner therapy: None at this time Active Infection(s): Reviewed. None identified. Denise Burns is afebrile  Site Confirmation: Denise Burns was asked to confirm the procedure and laterality before marking the site Procedure checklist: Completed Consent: Before the procedure and under the influence of no sedative(s), amnesic(s), or anxiolytics, the patient was informed of the treatment options, risks and possible complications. To fulfill our ethical and legal obligations, as recommended by the American Medical Association's Code of Ethics, I have informed the patient of my clinical impression; the nature and purpose of the treatment or procedure; the risks, benefits, and possible complications of the intervention; the alternatives, including doing nothing; the risk(s) and benefit(s) of the alternative treatment(s) or procedure(s); and the risk(s) and benefit(s) of doing nothing. The patient was provided information about the general risks and possible complications associated with the procedure. These may include, but are not limited to: failure to achieve desired goals, infection, bleeding, organ or nerve damage, allergic reactions, paralysis, and death. In addition, the patient was informed of those risks and complications associated to Spine-related procedures, such as failure to decrease pain; infection (i.e.: Meningitis, epidural or intraspinal abscess); bleeding (i.e.: epidural hematoma, subarachnoid hemorrhage, or any other type of intraspinal or peri-dural bleeding); organ or nerve damage (i.e.: Any type of peripheral nerve, nerve root, or spinal cord injury) with subsequent damage to sensory, motor, and/or autonomic systems, resulting in permanent pain, numbness, and/or weakness of one or several areas of the body;  allergic reactions; (i.e.: anaphylactic reaction); and/or death. Furthermore, the patient was informed of those risks and complications associated with the medications. These include, but are not limited to: allergic reactions (i.e.: anaphylactic or anaphylactoid reaction(s)); adrenal axis suppression; blood sugar elevation that in diabetics may result in ketoacidosis or comma; water retention that in patients with history of congestive heart failure may result in shortness of breath, pulmonary edema, and decompensation with resultant heart failure; weight gain; swelling or edema; medication-induced neural toxicity; particulate matter embolism and blood vessel occlusion with resultant organ, and/or nervous system infarction; and/or aseptic necrosis of one or more joints. Finally, the patient was informed that Medicine is not an exact science; therefore, there is also the possibility of unforeseen or unpredictable risks and/or possible complications that may result in a catastrophic outcome. The patient indicated having understood very clearly. We have given the patient no guarantees and we have made no promises. Enough time was given to the patient to ask questions, all of which were answered to the patient's satisfaction. Ms. Oesterling has indicated that she wanted to continue with the procedure. Attestation: I, the ordering provider, attest that I have discussed with the patient the benefits, risks, side-effects, alternatives, likelihood of achieving goals, and potential problems during recovery for the procedure that I have provided informed consent. Date  Time: 01/23/2024 10:31 AM  Pre-Procedure Preparation:  Monitoring: As per clinic protocol. Respiration, ETCO2, SpO2, BP, heart rate and rhythm monitor placed and checked for adequate function Safety Precautions: Patient was assessed for positional comfort and pressure points before starting the procedure. Time-out: I initiated and conducted the  Time-out before starting the procedure, as per protocol. The patient was asked to participate by confirming the accuracy of the Time Out information. Verification of the correct person, site, and procedure were performed and confirmed by me, the nursing staff, and the patient. Time-out conducted as per Joint Commission's Universal Protocol (UP.01.01.01). Time: 1117  Start Time: 1117 hrs.  Description/Narrative of Procedure:          Target: Epidural space via interlaminar opening, initially targeting the lower laminar border of the superior vertebral body. Region: Lumbar Approach: Percutaneous paravertebral  Rationale (medical necessity): procedure needed and proper for the diagnosis and/or treatment of the patient's medical symptoms and needs. Procedural Technique Safety Precautions: Aspiration looking for blood return was conducted prior to all injections. At no point did we inject any substances, as a needle was being advanced. No attempts were made at seeking any paresthesias. Safe injection practices and needle disposal techniques used. Medications properly checked for expiration dates. SDV (single dose vial) medications used. Description of the Procedure: Protocol guidelines were followed. The procedure needle was introduced through the skin, ipsilateral to the reported pain, and advanced to the target area. Bone was contacted and the needle walked caudad, until the lamina was cleared. The epidural space was identified using "loss-of-resistance technique" with 2-3 ml of PF-NaCl (0.9% NSS), in a 5cc LOR glass syringe.  Vitals:   01/23/24 1029 01/23/24 1116 01/23/24 1122  BP: (!) 132/94 (!) 146/107 (!) 147/97  Pulse: (!) 104    Resp: 18 18 18   Temp: 98.4 F (36.9 C)    SpO2: 97% 96% 97%  Weight: 179 lb (81.2 kg)    Height: 5' 4 (1.626 m)      Start Time: 1117 hrs. End Time: 1122 hrs.  Imaging Guidance (Spinal):          Type of Imaging Technique: Fluoroscopy Guidance  (Spinal) Indication(s): Fluoroscopy guidance for needle placement to enhance accuracy in procedures requiring precise needle localization for targeted delivery of medication in or near specific anatomical locations not easily accessible without such real-time imaging assistance. Exposure Time: Please see nurses notes. Contrast: Before injecting any contrast, we confirmed that the patient did not have an allergy to iodine , shellfish, or radiological contrast. Once satisfactory needle placement was completed at the desired level, radiological contrast was injected. Contrast injected under live fluoroscopy. No contrast complications. See chart for type and volume of contrast used. Fluoroscopic Guidance: I was personally present during the use of fluoroscopy. Tunnel Vision Technique used to obtain the best possible view of the target area. Parallax error corrected before commencing the procedure. Direction-depth-direction technique used to introduce the needle under continuous pulsed fluoroscopy. Once target was reached, antero-posterior, oblique, and lateral fluoroscopic projection used confirm needle placement in all planes. Images permanently stored in EMR. Interpretation: I personally interpreted the imaging intraoperatively. Adequate needle placement confirmed in multiple planes. Appropriate spread of contrast into desired area was observed. No evidence of afferent or efferent intravascular uptake. No intrathecal or subarachnoid spread observed. Permanent images saved into the patient's record.  Antibiotic Prophylaxis:   Anti-infectives (From admission, onward)    None      Indication(s): None identified  Post-operative Assessment:  Post-procedure Vital Signs:  Pulse/HCG Rate: (!) 10490 Temp: 98.4 F (36.9 C) Resp: 18 BP: (!) 147/97 SpO2: 97 %  EBL: None  Complications: No immediate post-treatment complications observed by team, or reported by patient.  Note: The patient tolerated  the entire procedure well. A repeat set of vitals were taken after the procedure and the patient was kept under observation following institutional policy, for this type of procedure. Post-procedural neurological assessment was performed, showing return to baseline, prior to discharge. The patient was provided with post-procedure discharge instructions, including a section on how to identify potential problems. Should any problems arise concerning this  procedure, the patient was given instructions to immediately contact us , at any time, without hesitation. In any case, we plan to contact the patient by telephone for a follow-up status report regarding this interventional procedure.  Comments:  No additional relevant information.  Plan of Care (POC)  Orders:  Orders Placed This Encounter  Procedures   Lumbar Epidural Injection    Scheduling Instructions:     Procedure: Interlaminar LESI L2-3     Laterality: Right     Sedation: Patient's choice     Date: 01/23/2024    Where will this procedure be performed?:   [facility]   [facility] C-ARM 1-60 MIN NO REPORT    Intraoperative interpretation by procedural physician at [facility].    Standing Status:   Standing    Number of Occurrences:   1    Reason for exam::   Assistance in needle guidance and placement for procedures requiring needle placement in or near specific anatomical locations not easily accessible without such assistance.   Informed Consent Details: Physician/Practitioner Attestation; Transcribe to consent form and obtain patient signature    Note: Always confirm laterality of pain with Ms. Sammie, before procedure. Transcribe to consent form and obtain patient signature.    Physician/Practitioner attestation of informed consent for procedure/surgical case:   I, the physician/practitioner, attest that I have discussed with the patient the benefits, risks, side effects, alternatives, likelihood of achieving  goals and potential problems during recovery for the procedure that I have provided informed consent.    Procedure:   Lumbar epidural steroid injection under fluoroscopic guidance    Physician/Practitioner performing the procedure:   Leonie Amacher A. Vashti Bolanos, MD    Indication/Reason:   Low back and/or lower extremity pain secondary to lumbar radiculitis   Provide equipment / supplies at bedside    Procedural tray: Epidural Tray (Disposable  single use) Skin infiltration needle: Regular 1.5-in, 25-G, (x1) Block needle size: Regular standard Catheter: No catheter required    Standing Status:   Standing    Number of Occurrences:   1    Specify:   Epidural Tray    Opioid Analgesic(s):  Hydrocodone /APAP 7.5/325 1 tablet p.o. 3 times daily (22.5 mg/day of hydrocodone )  MME/day: 22.5 mg/day.    Medications ordered for procedure: Meds ordered this encounter  Medications   iohexol  (OMNIPAQUE ) 180 MG/ML injection 10 mL    Must be Myelogram-compatible. If not available, you may substitute with a water-soluble, non-ionic, hypoallergenic, myelogram-compatible radiological contrast medium.   lidocaine  (XYLOCAINE ) 2 % (with pres) injection 400 mg   pentafluoroprop-tetrafluoroeth (GEBAUERS) aerosol   DISCONTD: midazolam  (VERSED ) 5 MG/5ML injection 0.5-2 mg    Make sure Flumazenil is available in the pyxis when using this medication. If oversedation occurs, administer 0.2 mg IV over 15 sec. If after 45 sec no response, administer 0.2 mg again over 1 min; may repeat at 1 min intervals; not to exceed 4 doses (1 mg)   DISCONTD: fentaNYL  (SUBLIMAZE ) injection 25-50 mcg    Make sure Narcan  is available in the pyxis when using this medication. In the event of respiratory depression (RR< 8/min): Titrate NARCAN  (naloxone ) in increments of 0.1 to 0.2 mg IV at 2-3 minute intervals, until desired degree of reversal.   sodium chloride  flush (NS) 0.9 % injection 2 mL   ropivacaine  (PF) 2 mg/mL (0.2%) (NAROPIN )  injection 2 mL   triamcinolone  acetonide (KENALOG -40) injection 40 mg   Medications administered: We administered iohexol , lidocaine , pentafluoroprop-tetrafluoroeth, sodium chloride   flush, ropivacaine  (PF) 2 mg/mL (0.2%), and triamcinolone  acetonide.  See the medical record for exact dosing, route, and time of administration.    Interventional Therapies  Risk  Complexity Considerations:   WNL   Planned  Pending:   Diagnostic right L2-3 LESI #2 (PRN)  Neurosurgery consult for evaluation of dynamic lumbar spondylosis and possible surgical alternatives (09/20/2023)   Under consideration:   Diagnostic right L2-3 LESI #2  Therapeutic bilateral lumbar facet medial branch RFA #3    Completed:   Diagnostic right L2-3 LESI x1 (09/14/2023) (3-0/10)  Therapeutic bilateral IA hip + bilateral trochanteric bursa inj. R4L3 (06/27/2023) (100/100/70/70)  Therapeutic bilateral lumbar facet MBB x3 (09/08/2020) (4-0) (100/100/60/60)  Therapeutic right lumbar facet RFA x2 (09/09/2021) (4-2) (100/100/90/>75)  Therapeutic left lumbar facet RFA x2 (10/07/2021) (4-0) (100/100/90/>75)  Therapeutic right IA hip inj. x2 (08/20/2020) (5-1) (100/100/80/80)  Therapeutic left IA hip inj. x1 (08/20/2020) (5-1) (100/100/80/80)  Therapeutic bilateral trochanteric bursa inj. x3 (04/07/2022)  (100/100/90/90)    Therapeutic  Palliative (PRN) options:   Palliative lumbar facet MBB   Palliative lumbar facet RFA  Palliative trochanteric bursa inj.    Pharmacotherapy  Nonopioids transferred 05/11/2020: Baclofen  and Mobic        Follow-up plan:   Return in about 2 weeks (around 02/06/2024) for (PPE), (Face2F).     Recent Visits Date Type Provider Dept  01/17/24 Office Visit Patel, Seema K, NP [facility]  12/18/23 Office Visit Patel, Seema K, NP [facility]  Showing recent visits within past 90 days and meeting all other requirements Today's Visits Date Type Provider Dept  01/23/24 Procedure  visit Tanya Glisson, MD [facility]  Showing today's visits and meeting all other requirements Future Appointments Date Type Provider Dept  02/06/24 Appointment Tanya Glisson, MD [facility]  03/19/24 Appointment Patel, Seema K, NP [facility]  Showing future appointments within next 90 days and meeting all other requirements   Disposition: Discharge home  Discharge (Date  Time): 01/23/2024; 1135 hrs.   Primary Care Physician: Chandra Toribio POUR, MD Location: [facility] Note by: Glisson DELENA Tanya, MD (TTS technology used. I apologize for any typographical errors that were not detected and corrected.) Date: 01/23/2024; Time: 11:26 AM  Disclaimer:  Medicine is not an Visual merchandiser. The only guarantee in medicine is that nothing is guaranteed. It is important to note that the decision to proceed with this intervention was based on the information collected from the patient. The Data and conclusions were drawn from the patient's questionnaire, the interview, and the physical examination. Because the information was provided in large part by the patient, it cannot be guaranteed that it has not been purposely or unconsciously manipulated. Every effort has been made to obtain as much relevant data as possible for this evaluation. It is important to note that the conclusions that lead to this procedure are derived in large part from the available data. Always take into account that the treatment will also be dependent on availability of resources and existing treatment guidelines, considered by other Pain Management Practitioners as being common knowledge and practice, at the time of the intervention. For Medico-Legal purposes, it is also important to point out that variation in procedural techniques and pharmacological choices are the acceptable norm. The indications, contraindications, technique, and results of the above procedure should  only be interpreted and judged by a Board-Certified Interventional Pain Specialist with extensive familiarity and expertise in the same exact procedure and technique.

## 2024-01-23 NOTE — Patient Instructions (Addendum)

## 2024-01-24 ENCOUNTER — Telehealth: Payer: Self-pay | Admitting: *Deleted

## 2024-01-24 NOTE — Telephone Encounter (Signed)
 Attempted to call for post procedure follow-up. Message left.

## 2024-02-06 ENCOUNTER — Encounter: Payer: Self-pay | Admitting: Pain Medicine

## 2024-02-06 ENCOUNTER — Ambulatory Visit: Attending: Pain Medicine | Admitting: Pain Medicine

## 2024-02-06 VITALS — BP 144/92 | HR 89 | Temp 97.4°F | Resp 16 | Ht 64.0 in | Wt 179.0 lb

## 2024-02-06 DIAGNOSIS — R1031 Right lower quadrant pain: Secondary | ICD-10-CM | POA: Diagnosis not present

## 2024-02-06 DIAGNOSIS — M5416 Radiculopathy, lumbar region: Secondary | ICD-10-CM | POA: Insufficient documentation

## 2024-02-06 DIAGNOSIS — G8929 Other chronic pain: Secondary | ICD-10-CM | POA: Insufficient documentation

## 2024-02-06 DIAGNOSIS — Z09 Encounter for follow-up examination after completed treatment for conditions other than malignant neoplasm: Secondary | ICD-10-CM | POA: Insufficient documentation

## 2024-02-06 DIAGNOSIS — M25551 Pain in right hip: Secondary | ICD-10-CM | POA: Insufficient documentation

## 2024-02-06 DIAGNOSIS — M79604 Pain in right leg: Secondary | ICD-10-CM | POA: Insufficient documentation

## 2024-02-06 DIAGNOSIS — M160 Bilateral primary osteoarthritis of hip: Secondary | ICD-10-CM | POA: Diagnosis not present

## 2024-02-06 DIAGNOSIS — M25552 Pain in left hip: Secondary | ICD-10-CM | POA: Diagnosis not present

## 2024-02-06 NOTE — Progress Notes (Signed)
 Safety precautions to be maintained throughout the outpatient stay will include: orient to surroundings, keep bed in low position, maintain call bell within reach at all times, provide assistance with transfer out of bed and ambulation.

## 2024-02-06 NOTE — Patient Instructions (Signed)

## 2024-02-06 NOTE — Progress Notes (Signed)
 PROVIDER NOTE: Interpretation of information contained herein should be left to medically-trained personnel. Specific patient instructions are provided elsewhere under Patient Instructions section of medical record. This document was created in part using AI and STT-dictation technology, any transcriptional errors that may result from this process are unintentional.  Patient: Denise Burns  Service: E/M   PCP: Chandra Toribio POUR, MD  DOB: [birth year]  DOS: 02/06/2024  Provider: Eric DELENA Como, MD  MRN: [id number]  Delivery: Face-to-face  Specialty: Interventional Pain Management  Type: Established Patient  Setting: Ambulatory outpatient facility  Specialty designation: 09  Referring Prov.: Chandra Toribio POUR, MD  Location: Outpatient office facility       History of present illness (HPI) Denise Burns, a 53 y.o. year old female, is here today because of her Chronic pain of right lower extremity [M79.604, G89.29]. Denise Burns's primary complain today is Back Pain  Pertinent problems: Denise Burns has Chronic midline low back pain with bilateral sciatica; Chronic low back pain (1ry area of Pain) (Bilateral) (R>L) w/o sciatica; Chronic sacroiliac joint pain (2ry area of Pain) (Right); Chronic pain syndrome; Abnormal MRI, lumbar spine (03/22/2018); Lumbar facet hypertrophy (Multilevel) (Bilateral); Lumbar facet syndrome (Bilateral); Spondylolisthesis (8 mm) at L5-S1 level; Lumbar Grade 1 Retrolisthesis (2 mm) of L2/L3; L5-S1 pars defect w/ spondylolisthesis (Bilateral); Lumbar pars defect (L5-S1) (Bilateral); Lumbar foraminal stenosis (Left: L2-3) (Bilateral: L5-S1); Osteoarthritis of sacroiliac joint (Bilateral); Chronic musculoskeletal pain; Neurogenic pain; Osteoarthritis of facet joint of lumbar spine; Greater trochanteric bursitis (Bilateral); Lumbar spondylosis; Spondylosis without myelopathy or radiculopathy, lumbosacral region; Chronic hip pain (Right); Osteoarthritis of hip (Right); Lumbar  radiculitis (L1/L2) (Right); Chronic groin pain (Right); Chronic hip pain (Bilateral); Enthesopathy hip region (Left); Osteoarthritis of hips (Bilateral); Kissing spine syndrome; Chronic low back pain (Midline) w/o sciatica; Trigger point with back pain (PSIS area) (Right); Lumbar facet joint pain; Broken toes (Right); Closed displaced fracture of distal phalanx of lesser toe of right foot; Chronic lower extremity pain (Right); Lumbosacral radiculopathy at L2 (Right); Lumbosacral radiculopathy at L3 (Right); Weakness of lower extremity; DDD (degenerative disc disease), lumbosacral w/ LBP and LEP; Pain of both elbows; Lumbosacral Anterolisthesis of L5/S1 (Dynamic) (N/E:44mm  F:21mm); Multifactorial low back pain; and Low back pain of over 3 months duration on their pertinent problem list.  Pain Assessment: Severity of Chronic pain is reported as a 4 /10. Location: Back Lower, Right, Left/Radaites from lower back into hips bilateral into outer upper thigh bilateral. Onset: More than a month ago. Quality: Constant, Aching, Shooting. Timing: Constant. Modifying factor(s): Sitting, resting, ice, heat and pain medication. Vitals:  height is 5' 4 (1.626 m) and weight is 179 lb (81.2 kg). Her temporal temperature is 97.4 F (36.3 C) (abnormal). Her blood pressure is 144/92 (abnormal) and her pulse is 89. Her respiration is 16 and oxygen saturation is 98%.  BMI: Estimated body mass index is 30.73 kg/m as calculated from the following:   Height as of this encounter: 5' 4 (1.626 m).   Weight as of this encounter: 179 lb (81.2 kg).  Last encounter: 09/20/2023. Last procedure: 01/23/2024.  Reason for encounter: post-procedure evaluation and assessment.   Discussed the use of AI scribe software for clinical note transcription with the patient, who gave verbal consent to proceed.  History of Present Illness   Nil Bolser is a 53 year old female who presents with persistent hip and lower back pain.  She  experiences pain located in the middle of the buttocks, top of the thighs,  and lower back, which disrupts her sleep as she wakes up multiple times to change positions. The pain in her right leg, previously more severe, has improved significantly since the last procedure, although there was a slight recurrence last night and today. The pain no longer causes her leg to give out.  She describes the pain as feeling 'really, really tight' in the back, with stretching and bending over providing some relief. This pain is similar to previous episodes, although the last epidural was for a different issue.  The last treatment for her hip pain was in November of the previous year, initially providing complete relief and then a 70% improvement after the numbness wore off. However, the pain has started to bother her again, particularly in the hip joint, radiating into the thigh and side of the hip. She also experiences pain in the groin area that extends to the sides.  No use of blood thinners. She confirms experiencing pain in the groin area.      Post-Procedure Evaluation   Type: Lumbar epidural steroid injection (LESI) (interlaminar) #2    Laterality: Right   Level:  L2-3 Level.  Imaging: Fluoroscopic guidance Spinal ([id number]) Anesthesia: Local anesthesia (1-2% Lidocaine ) Anxiolysis: None                 Sedation: No Sedation                       DOS: 01/23/2024  Performed by: Eric DELENA Como, MD  Purpose: Diagnostic/Therapeutic Indications: Lumbar radicular pain of intraspinal etiology of more than 4 weeks that has failed to respond to conservative therapy and is severe enough to impact quality of life or function. 1. Lumbar radiculitis (L1/L2) (Right)   2. Lumbosacral radiculopathy at L2 (Right)   3. Lumbosacral radiculopathy at L3 (Right)   4. Chronic lower extremity pain (Right)   5. Lumbar Grade 1 Retrolisthesis (2 mm) of L2/L3   6. Lumbar foraminal stenosis (Left: L2-3) (Bilateral:  L5-S1)   7. Chronic low back pain (1ry area of Pain) (Bilateral) (R>L) w/o sciatica   8. DDD (degenerative disc disease), lumbosacral w/ LBP and LEP   9. Low back pain of over 3 months duration    NAS-11 Pain score:   Pre-procedure: 5 /10   Post-procedure: 5 /10     Effectiveness:  Initial hour after procedure: 100 %. Subsequent 4-6 hours post-procedure: 100 %. Analgesia past initial 6 hours: 60 % (60% relief long term since 2 days ago, before 2 days ago it was 100% releif). Ongoing improvement:  Analgesic: The patient indicates having attained 100% relief of the pain for the duration of local anesthetic which actually continued SI 100% relief until approximately 2 days ago when he went down to an ongoing 60% improvement. Function: Denise Burns reports improvement in function ROM: Denise Burns reports improvement in ROM  Pharmacotherapy Assessment   Opioid Analgesic(s):  Hydrocodone /APAP 7.5/325 1 tablet p.o. 3 times daily (22.5 mg/day of hydrocodone )  MME/day: 22.5 mg/day.   Monitoring: [place] PMP: PDMP reviewed during this encounter.       Pharmacotherapy: No side-effects or adverse reactions reported. Compliance: No problems identified. Effectiveness: Clinically acceptable.  Denise Norris, RN  02/06/2024 12:48 PM  Sign when Signing Visit Safety precautions to be maintained throughout the outpatient stay will include: orient to surroundings, keep bed in low position, maintain call bell within reach at all times, provide assistance with transfer out of bed and ambulation.  UDS:  Summary  Date Value Ref Range Status  03/22/2023 Note  Final    Comment:    ==================================================================== [employer] [facility] (MW) ==================================================================== Test                             Result       Flag       Units  Drug Present and Declared for Prescription Verification   Hydrocodone                     1894          EXPECTED   ng/mg creat   Hydromorphone                   329          EXPECTED   ng/mg creat   Dihydrocodeine                 292          EXPECTED   ng/mg creat   Norhydrocodone                 1758         EXPECTED   ng/mg creat    Sources of hydrocodone  include scheduled prescription medications.    Hydromorphone , dihydrocodeine and norhydrocodone are expected    metabolites of hydrocodone . Hydromorphone  and dihydrocodeine are    also available as scheduled prescription medications.    Oxymorphone                    335          EXPECTED   ng/mg creat   Noroxymorphone                 134          EXPECTED   ng/mg creat    Sources of oxymorphone are scheduled prescription medications;    oxymorphone is also a metabolite of oxycodone . Noroxymorphone is an    expected metabolite of oxymorphone.  Drug Absent but Declared for Prescription Verification   Oxycodone                       Not Detected UNEXPECTED ng/mg creat    Oxycodone  is almost always present in patients taking this drug    consistently.  Absence of oxycodone  could be due to lapse of time    since the last dose or unusual pharmacokinetics (rapid metabolism).  ==================================================================== Test                      Result    Flag   Units      Ref Range   Creatinine              65               mg/dL      >=79 ==================================================================== Declared Medications:  The flagging and interpretation on this report are based on the  following declared medications.  Unexpected results may arise from  inaccuracies in the declared medications.   **Note: The testing scope of this panel includes these medications:   Hydrocodone  (Norco)  Oxycodone  (Percocet)   **Note: The testing scope of this panel does not include the  following reported medications:   Acetaminophen  (Norco)  Acetaminophen  (Percocet)  Azelastine   Cetirizine (Zyrtec)  Escitalopram   (Lexapro )  Fludrocortisone  (Florinef )  Fluticasone  (  Flonase )  Hydrocortisone  (Cortef )  Hyoscyamine  Lansoprazole  (Prevacid )  Meloxicam  (Mobic )  Methenamine   Methylene Blue  Naloxone  (Narcan )  Omega-3 Fatty Acids  Pseudoephedrine  (Sudafed)  Supplement  Vitamin D2 ==================================================================== For clinical consultation, please call [phone]. ====================================================================     No results found for: CBDTHCR No results found for: D8THCCBX No results found for: D9THCCBX  ROS  Constitutional: Denies any fever or chills Gastrointestinal: No reported hemesis, hematochezia, vomiting, or acute GI distress Musculoskeletal: Denies any acute onset joint swelling, redness, loss of ROM, or weakness Neurological: No reported episodes of acute onset apraxia, aphasia, dysarthria, agnosia, amnesia, paralysis, loss of coordination, or loss of consciousness  Medication Review  Azelastine  HCl, HYDROcodone -acetaminophen , Urogesic-Blue, cetirizine, doxycycline , escitalopram , fludrocortisone , fluticasone , hydrocortisone , lansoprazole , meloxicam , naloxone , omega-3 acid ethyl esters, pseudoephedrine , rosuvastatin , and tiZANidine   History Review  Allergy: Denise Burns is allergic to tramadol, lisinopril, and sulfa antibiotics. Drug: Denise Burns  reports no history of drug use. Alcohol:  reports that she does not currently use alcohol. Tobacco:  reports that she quit smoking about 29 years ago. Her smoking use included cigarettes. She has never used smokeless tobacco. Social: Ms. Beam  reports that she quit smoking about 29 years ago. Her smoking use included cigarettes. She has never used smokeless tobacco. She reports that she does not currently use alcohol. She reports that she does not use drugs. Medical:  has a past medical history of Addison disease (HCC), Arthritis, Back pain, Complication of anesthesia,  GERD (gastroesophageal reflux disease), and History of kidney stones. Surgical: Ms. Goh  has a past surgical history that includes Cholecystectomy (2010); Polypectomy; dental work; Dilation and curettage of uterus (2010); IR NEPHROSTOMY PLACEMENT LEFT (12/14/2020); Nephrolithotomy (Left, 12/14/2020); Wisdom tooth extraction; Diagnostic laparoscopy (2004); Cystoscopy/ureteroscopy/holmium laser/stent placement (Right, 06/28/2021); Cystoscopy w/ ureteral stent removal (Right, 08/09/2021); Cystoscopy w/ retrogrades (Right, 08/09/2021); Cystoscopy/ureteroscopy/holmium laser/stent placement (Right, 08/09/2021); and Colonoscopy with propofol  (N/A, 06/08/2022). Family: family history includes Alcohol abuse in her father; Arthritis in her mother; Brain cancer in her mother; Colon polyps (age of onset: 72) in her maternal grandmother; Hearing loss in her father; High blood pressure in her father and mother; Hyperlipidemia in her father and mother; Hypertension in her mother; Liver cancer in her mother; Lung cancer in her mother.  Laboratory Chemistry Profile   Renal Lab Results  Component Value Date   BUN 18 09/12/2023   CREATININE 0.91 09/12/2023   BCR 20 09/12/2023   GFR 84.36 08/26/2021   GFRAA 86 08/16/2018   GFRNONAA >60 09/18/2022    Hepatic Lab Results  Component Value Date   AST 25 09/12/2023   ALT 31 09/12/2023   ALBUMIN 4.4 09/12/2023   ALKPHOS 68 09/12/2023   LIPASE 38 09/16/2022    Electrolytes Lab Results  Component Value Date   NA 142 09/12/2023   K 3.9 09/12/2023   CL 104 09/12/2023   CALCIUM  9.5 09/12/2023   MG 2.2 09/18/2022   PHOS 4.3 09/18/2022    Bone Lab Results  Component Value Date   VD25OH 14.0 (L) 06/15/2022   25OHVITD1 67 08/16/2018   25OHVITD2 <1.0 08/16/2018   25OHVITD3 67 08/16/2018   TESTOSTERONE  1 (L) 08/13/2020    Inflammation (CRP: Acute Phase) (ESR: Chronic Phase) Lab Results  Component Value Date   CRP 2 08/16/2018   ESRSEDRATE 37 (H) 08/16/2018    LATICACIDVEN 1.9 09/16/2022         Note: Above Lab results reviewed.  Recent Imaging Review  DG PAIN CLINIC C-ARM 1-60 MIN NO  REPORT Fluoro was used, but no Radiologist interpretation will be provided.  Please refer to NOTES tab for provider progress note. Note: Reviewed        Physical Exam  Vitals: BP (!) 144/92 (Cuff Size: Normal)   Pulse 89   Temp (!) 97.4 F (36.3 C) (Temporal)   Resp 16   Ht 5' 4 (1.626 m)   Wt 179 lb (81.2 kg)   LMP 12/01/2020 (Approximate)   SpO2 98%   BMI 30.73 kg/m  BMI: Estimated body mass index is 30.73 kg/m as calculated from the following:   Height as of this encounter: 5' 4 (1.626 m).   Weight as of this encounter: 179 lb (81.2 kg). Ideal: Ideal body weight: 54.7 kg (120 lb 9.5 oz) Adjusted ideal body weight: 65.3 kg (143 lb 15.3 oz) General appearance: Well nourished, well developed, and well hydrated. In no apparent acute distress Mental status: Alert, oriented x 3 (person, place, & time)       Respiratory: No evidence of acute respiratory distress Eyes: PERLA Physical Exam   MUSCULOSKELETAL: Pain in the hip joint with maneuvering, radiating to the thigh.       Assessment   Diagnosis Status  1. Chronic lower extremity pain (Right)   2. Lumbar radiculitis (L1/L2) (Right)   3. Chronic hip pain (Bilateral)   4. Chronic groin pain (Right)   5. Osteoarthritis of hips (Bilateral)   6. Postop check    Improved Improved Worsened   Updated Problems: No problems updated.  Plan of Care  Problem-specific:  Assessment and Plan    Chronic hip joint pain   Chronic hip joint pain has recently worsened, radiating into the thigh, side of the hip, and groin. A previous intervention in November last year resulted in 70% improvement. The recurrence of pain suggests worsening symptoms. Schedule a hip joint injection with sedation, ensuring no contraindications such as blood thinners.  Chronic facet joint pain   Chronic facet joint pain  affects the lower back, worsened by hyperextension and rotation. Previous epidural intervention provided some relief, but symptoms persist, especially on the left side. Pain during the camp maneuver indicates facet joint involvement. Monitor symptoms and consider future interventions if pain persists or worsens.       Denise Burns has a current medication list which includes the following long-term medication(s): azelastine  hcl, escitalopram , flonase  sensimist, hydrocodone -acetaminophen , hydrocodone -acetaminophen , [START ON 02/22/2024] hydrocodone -acetaminophen , lansoprazole , meloxicam , omega-3 acid ethyl esters, and rosuvastatin .  Pharmacotherapy (Medications Ordered): No orders of the defined types were placed in this encounter.  Orders:  Orders Placed This Encounter  Procedures   HIP INJECTION    Standing Status:   Future    Expiration Date:   05/08/2024    Scheduling Instructions:     Procedure: Intra-articular Hip joint injection     Side: Bilateral     Approach: Lateral     Sedation: With Sedation.     Timeframe: As soon as schedule allows   Nursing Instructions:    Please complete this patient's postprocedure evaluation.    Scheduling Instructions:     Please complete this patient's postprocedure evaluation.     Interventional Therapies  Risk  Complexity Considerations:   WNL   Planned  Pending:   Diagnostic right L2-3 LESI #2 (PRN)  Neurosurgery consult for evaluation of dynamic lumbar spondylosis and possible surgical alternatives (09/20/2023)   Under consideration:   Diagnostic right L2-3 LESI #2  Therapeutic bilateral lumbar facet medial branch RFA #3  Completed:   Diagnostic right L2-3 LESI x1 (09/14/2023) (3-0/10)  Therapeutic bilateral IA hip + bilateral trochanteric bursa inj. R4L3 (06/27/2023) (100/100/70/70)  Therapeutic bilateral lumbar facet MBB x3 (09/08/2020) (4-0) (100/100/60/60)  Therapeutic right lumbar facet RFA x2 (09/09/2021) (4-2)  (100/100/90/>75)  Therapeutic left lumbar facet RFA x2 (10/07/2021) (4-0) (100/100/90/>75)  Therapeutic right IA hip inj. x2 (08/20/2020) (5-1) (100/100/80/80)  Therapeutic left IA hip inj. x1 (08/20/2020) (5-1) (100/100/80/80)  Therapeutic bilateral trochanteric bursa inj. x3 (04/07/2022)  (100/100/90/90)    Therapeutic  Palliative (PRN) options:   Palliative lumbar facet MBB   Palliative lumbar facet RFA  Palliative trochanteric bursa inj.    Pharmacotherapy  Nonopioids transferred 05/11/2020: Baclofen  and Mobic       Return for (ECT): (B) IA Hip inj..    Recent Visits Date Type Provider Dept  01/23/24 Procedure visit Tanya Glisson, MD [facility]  01/17/24 Office Visit Patel, Seema K, NP [facility]  12/18/23 Office Visit Patel, Seema K, NP [facility]  Showing recent visits within past 90 days and meeting all other requirements Today's Visits Date Type Provider Dept  02/06/24 Office Visit Tanya Glisson, MD [facility]  Showing today's visits and meeting all other requirements Future Appointments Date Type Provider Dept  03/19/24 Appointment Patel, Seema K, NP [facility]  Showing future appointments within next 90 days and meeting all other requirements  I discussed the assessment and treatment plan with the patient. The patient was provided an opportunity to ask questions and all were answered. The patient agreed with the plan and demonstrated an understanding of the instructions.  Patient advised to call back or seek an in-person evaluation if the symptoms or condition worsens.  Duration of encounter: 30 minutes.  Total time on encounter, as per AMA guidelines included both the face-to-face and non-face-to-face time personally spent by the physician and/or other qualified health care professional(s) on the day of the encounter (includes time in activities that require the physician or other qualified health care  professional and does not include time in activities normally performed by clinical staff). Physician's time may include the following activities when performed: Preparing to see the patient (e.g., pre-charting review of records, searching for previously ordered imaging, lab work, and nerve conduction tests) Review of prior analgesic pharmacotherapies. Reviewing PMP Interpreting ordered tests (e.g., lab work, imaging, nerve conduction tests) Performing post-procedure evaluations, including interpretation of diagnostic procedures Obtaining and/or reviewing separately obtained history Performing a medically appropriate examination and/or evaluation Counseling and educating the patient/family/caregiver Ordering medications, tests, or procedures Referring and communicating with other health care professionals (when not separately reported) Documenting clinical information in the electronic or other health record Independently interpreting results (not separately reported) and communicating results to the patient/ family/caregiver Care coordination (not separately reported)  Note by: Glisson DELENA Tanya, MD (TTS and AI technology used. I apologize for any typographical errors that were not detected and corrected.) Date: 02/06/2024; Time: 1:29 PM

## 2024-02-13 ENCOUNTER — Ambulatory Visit
Admission: RE | Admit: 2024-02-13 | Discharge: 2024-02-13 | Disposition: A | Discharge status: Home / Self Care | Source: Ambulatory Visit | Attending: Pain Medicine | Admitting: Pain Medicine

## 2024-02-13 ENCOUNTER — Encounter: Payer: Self-pay | Admitting: Pain Medicine

## 2024-02-13 ENCOUNTER — Ambulatory Visit (HOSPITAL_BASED_OUTPATIENT_CLINIC_OR_DEPARTMENT_OTHER): Admitting: Pain Medicine

## 2024-02-13 VITALS — BP 140/84 | HR 84 | Temp 97.7°F | Resp 14 | Ht 64.0 in | Wt 179.0 lb

## 2024-02-13 DIAGNOSIS — M25552 Pain in left hip: Secondary | ICD-10-CM | POA: Diagnosis not present

## 2024-02-13 DIAGNOSIS — M7061 Trochanteric bursitis, right hip: Secondary | ICD-10-CM

## 2024-02-13 DIAGNOSIS — G8929 Other chronic pain: Secondary | ICD-10-CM | POA: Insufficient documentation

## 2024-02-13 DIAGNOSIS — M25551 Pain in right hip: Secondary | ICD-10-CM | POA: Insufficient documentation

## 2024-02-13 DIAGNOSIS — M160 Bilateral primary osteoarthritis of hip: Secondary | ICD-10-CM | POA: Insufficient documentation

## 2024-02-13 DIAGNOSIS — M76892 Other specified enthesopathies of left lower limb, excluding foot: Secondary | ICD-10-CM | POA: Diagnosis not present

## 2024-02-13 DIAGNOSIS — M7062 Trochanteric bursitis, left hip: Secondary | ICD-10-CM | POA: Diagnosis not present

## 2024-02-13 DIAGNOSIS — R1031 Right lower quadrant pain: Secondary | ICD-10-CM | POA: Diagnosis not present

## 2024-02-13 MED ORDER — ROPIVACAINE HCL 2 MG/ML IJ SOLN
18.0000 mL | Freq: Once | INTRAMUSCULAR | Status: AC
  Administered 2024-02-13: 18 mL via INTRA_ARTICULAR

## 2024-02-13 MED ORDER — IOHEXOL 180 MG/ML  SOLN
10.0000 mL | Freq: Once | INTRAMUSCULAR | Status: AC
  Administered 2024-02-13: 10 mL via INTRA_ARTICULAR

## 2024-02-13 MED ORDER — FENTANYL CITRATE (PF) 100 MCG/2ML IJ SOLN
INTRAMUSCULAR | Status: AC
  Filled 2024-02-13: qty 2

## 2024-02-13 MED ORDER — MIDAZOLAM HCL 5 MG/5ML IJ SOLN
INTRAMUSCULAR | Status: AC
  Filled 2024-02-13: qty 5

## 2024-02-13 MED ORDER — PENTAFLUOROPROP-TETRAFLUOROETH EX AERO: INHALATION_SPRAY | Freq: Once | CUTANEOUS | Status: DC

## 2024-02-13 MED ORDER — LIDOCAINE HCL 2 % IJ SOLN
INTRAMUSCULAR | Status: AC
  Filled 2024-02-13: qty 20

## 2024-02-13 MED ORDER — FENTANYL CITRATE (PF) 100 MCG/2ML IJ SOLN
25.0000 ug | INTRAMUSCULAR | Status: DC | PRN
  Administered 2024-02-13: 50 ug via INTRAVENOUS

## 2024-02-13 MED ORDER — IOHEXOL 180 MG/ML  SOLN
INTRAMUSCULAR | Status: AC
  Filled 2024-02-13: qty 10

## 2024-02-13 MED ORDER — MIDAZOLAM HCL 5 MG/5ML IJ SOLN
0.5000 mg | Freq: Once | INTRAMUSCULAR | Status: AC
  Administered 2024-02-13: 3 mg via INTRAVENOUS

## 2024-02-13 MED ORDER — METHYLPREDNISOLONE ACETATE 80 MG/ML IJ SUSP
160.0000 mg | Freq: Once | INTRAMUSCULAR | Status: AC
  Administered 2024-02-13: 160 mg via INTRA_ARTICULAR

## 2024-02-13 MED ORDER — ROPIVACAINE HCL 2 MG/ML IJ SOLN
INTRAMUSCULAR | Status: AC
  Filled 2024-02-13: qty 20

## 2024-02-13 MED ORDER — METHYLPREDNISOLONE ACETATE 80 MG/ML IJ SUSP
INTRAMUSCULAR | Status: AC
  Filled 2024-02-13: qty 2

## 2024-02-13 MED ORDER — LIDOCAINE HCL 2 % IJ SOLN
20.0000 mL | Freq: Once | INTRAMUSCULAR | Status: AC
  Administered 2024-02-13: 400 mg

## 2024-02-13 NOTE — Progress Notes (Signed)
 Safety precautions to be maintained throughout the outpatient stay will include: orient to surroundings, keep bed in low position, maintain call bell within reach at all times, provide assistance with transfer out of bed and ambulation.

## 2024-02-13 NOTE — Progress Notes (Signed)
 PROVIDER NOTE: Interpretation of information contained herein should be left to medically-trained personnel. Specific patient instructions are provided elsewhere under Patient Instructions section of medical record. This document was created in part using STT-dictation technology, any transcriptional errors that may result from this process are unintentional.  Patient: Denise Burns Type: Established DOB: Feb 22, 1971 MRN: [id number] PCP: Chandra Toribio POUR, MD  Service: Procedure DOS: 02/13/2024 Setting: Ambulatory Location: Ambulatory outpatient facility Delivery: Face-to-face Provider: Eric DELENA Como, MD Specialty: Interventional Pain Management Specialty designation: 09 Location: Outpatient facility Ref. Prov.: Como Eric, MD       Interventional Therapy   Type: Hip & bursae injection R5L4  Laterality: Bilateral (-50)  Bursae: Trochanteric  Laterality: Bilateral (-50)  Approach: Percutaneous posterolateral approach. Level: Lower pelvic and hip joint level.  Imaging: Fluoroscopy-guided Non-spinal ([id number]) Anesthesia: Local anesthesia (1-2% Lidocaine ) Anxiolysis: IV Versed  3.0 mg Sedation: Moderate Sedation Fentanyl  1 mL (50 mcg) DOS: 02/13/2024  Performed by: Eric DELENA Como, MD  Purpose: Diagnostic/Therapeutic Indications: Hip pain severe enough to impact quality of life or function. Rationale (medical necessity): procedure needed and proper for the diagnosis and/or treatment of Denise Burns's medical symptoms and needs. 1. Chronic hip pain (Bilateral)   2. Chronic groin pain (Right)   3. Enthesopathy hip region (Left)   4. Greater trochanteric bursitis (Bilateral)   5. Osteoarthritis of hips (Bilateral)    NAS-11 Pain score:   Pre-procedure: 4 /10   Post-procedure: 0-No pain/10      Target: Intra-articular aspect of the hip joint & peri-articular bursae Region: Hip joint proper. Femoral region Procedure Type: Percutaneous injection   Position  / Prep / Materials:  Position: Prone  Prep solution: ChloraPrep (2% chlorhexidine  gluconate and 70% isopropyl alcohol) Prep Area:  Entire Posterolateral hip area. Materials:  Tray: Block tray Needle(s):  Type: Spinal  Gauge (G): 22  Length: 7-in  Qty: 2  H&P (Pre-op Assessment):  Denise Burns is a 53 y.o. (year old), female patient, seen today for interventional treatment. She  has a past surgical history that includes Cholecystectomy (2010); Polypectomy; dental work; Dilation and curettage of uterus (2010); IR NEPHROSTOMY PLACEMENT LEFT (12/14/2020); Nephrolithotomy (Left, 12/14/2020); Wisdom tooth extraction; Diagnostic laparoscopy (2004); Cystoscopy/ureteroscopy/holmium laser/stent placement (Right, 06/28/2021); Cystoscopy w/ ureteral stent removal (Right, 08/09/2021); Cystoscopy w/ retrogrades (Right, 08/09/2021); Cystoscopy/ureteroscopy/holmium laser/stent placement (Right, 08/09/2021); and Colonoscopy with propofol  (N/A, 06/08/2022). Denise Burns has a current medication list which includes the following prescription(s): azelastine  hcl, cetirizine, doxycycline , escitalopram , fludrocortisone , flonase  sensimist, hydrocodone -acetaminophen , hydrocodone -acetaminophen , [START ON 02/22/2024] hydrocodone -acetaminophen , hydrocortisone , lansoprazole , meloxicam , urogesic-blue, naloxone , omega-3 acid ethyl esters, pseudoephedrine , rosuvastatin , and tizanidine , and the following Facility-Administered Medications: fentanyl  and pentafluoroprop-tetrafluoroeth. Her primarily concern today is the Hip Pain (both)  Initial Vital Signs:  Pulse/HCG Rate: 84ECG Heart Rate: 78 (nsr) Temp: 98.1 F (36.7 C) Resp: 18 BP: (!) 142/92 SpO2: 97 %  BMI: Estimated body mass index is 30.73 kg/m as calculated from the following:   Height as of this encounter: 5' 4 (1.626 m).   Weight as of this encounter: 179 lb (81.2 kg).  Risk Assessment: Allergies: Reviewed. She is allergic to tramadol, lisinopril, and sulfa  antibiotics.  Allergy Precautions: None required Coagulopathies: Reviewed. None identified.  Blood-thinner therapy: None at this time Active Infection(s): Reviewed. None identified. Denise Burns is afebrile  Site Confirmation: Denise Burns was asked to confirm the procedure and laterality before marking the site Procedure checklist: Completed Consent: Before the procedure and under the influence of no sedative(s), amnesic(s), or anxiolytics, the patient was informed of  the treatment options, risks and possible complications. To fulfill our ethical and legal obligations, as recommended by the American Medical Association's Code of Ethics, I have informed the patient of my clinical impression; the nature and purpose of the treatment or procedure; the risks, benefits, and possible complications of the intervention; the alternatives, including doing nothing; the risk(s) and benefit(s) of the alternative treatment(s) or procedure(s); and the risk(s) and benefit(s) of doing nothing. The patient was provided information about the general risks and possible complications associated with the procedure. These may include, but are not limited to: failure to achieve desired goals, infection, bleeding, organ or nerve damage, allergic reactions, paralysis, and death. In addition, the patient was informed of those risks and complications associated to the procedure, such as failure to decrease pain; infection; bleeding; organ or nerve damage with subsequent damage to sensory, motor, and/or autonomic systems, resulting in permanent pain, numbness, and/or weakness of one or several areas of the body; allergic reactions; (i.e.: anaphylactic reaction); and/or death. Furthermore, the patient was informed of those risks and complications associated with the medications. These include, but are not limited to: allergic reactions (i.e.: anaphylactic or anaphylactoid reaction(s)); adrenal axis suppression; blood sugar elevation  that in diabetics may result in ketoacidosis or comma; water retention that in patients with history of congestive heart failure may result in shortness of breath, pulmonary edema, and decompensation with resultant heart failure; weight gain; swelling or edema; medication-induced neural toxicity; particulate matter embolism and blood vessel occlusion with resultant organ, and/or nervous system infarction; and/or aseptic necrosis of one or more joints. Finally, the patient was informed that Medicine is not an exact science; therefore, there is also the possibility of unforeseen or unpredictable risks and/or possible complications that may result in a catastrophic outcome. The patient indicated having understood very clearly. We have given the patient no guarantees and we have made no promises. Enough time was given to the patient to ask questions, all of which were answered to the patient's satisfaction. Ms. Henigan has indicated that she wanted to continue with the procedure. Attestation: I, the ordering provider, attest that I have discussed with the patient the benefits, risks, side-effects, alternatives, likelihood of achieving goals, and potential problems during recovery for the procedure that I have provided informed consent. Date  Time: 02/13/2024  9:46 AM  Pre-Procedure Preparation:  Monitoring: As per clinic protocol. Respiration, ETCO2, SpO2, BP, heart rate and rhythm monitor placed and checked for adequate function Safety Precautions: Patient was assessed for positional comfort and pressure points before starting the procedure. Time-out: I initiated and conducted the Time-out before starting the procedure, as per protocol. The patient was asked to participate by confirming the accuracy of the Time Out information. Verification of the correct person, site, and procedure were performed and confirmed by me, the nursing staff, and the patient. Time-out conducted as per Joint Commission's  Universal Protocol (UP.01.01.01). Time: 1047 Start Time: 1047 hrs.  Narrative                Rationale (medical necessity): procedure needed and proper for the diagnosis and/or treatment of the patient's medical symptoms and needs. Procedural Technique Safety Precautions: Aspiration looking for blood return was conducted prior to all injections. At no point did we inject any substances, as a needle was being advanced. No attempts were made at seeking any paresthesias. Safe injection practices and needle disposal techniques used. Medications properly checked for expiration dates. SDV (single dose vial) medications used. Description of the Procedure: Protocol  guidelines were followed. The patient was assisted into a comfortable position. The target area was identified and the area prepped in the usual manner. Skin & deeper tissues infiltrated with local anesthetic. Appropriate amount of time allowed to pass for local anesthetics to take effect. The procedure needles were then advanced to the target area. Proper needle placement secured. Negative aspiration confirmed. Solution injected in intermittent fashion, asking for systemic symptoms every 0.5cc of injectate. The needles were then removed and the area cleansed, making sure to leave some of the prepping solution back to take advantage of its long term bactericidal properties.  Technical description of procedure:  Skin & deeper tissues infiltrated with local anesthetic. Appropriate amount of time allowed to pass for local anesthetics to take effect. The procedure needles were then advanced to the target area. Proper needle placement secured. Negative aspiration confirmed. Solution injected in intermittent fashion, asking for systemic symptoms every 0.5cc of injectate. The needles were then removed and the area cleansed, making sure to leave some of the prepping solution back to take advantage of its long term bactericidal properties.                           Vitals:   02/13/24 1058 02/13/24 1107 02/13/24 1120 02/13/24 1128  BP: 126/88 (!) 128/92 132/78 (!) 140/84  Pulse:      Resp: 15 15 15 14   Temp:  (!) 97.3 F (36.3 C)  97.7 F (36.5 C)  TempSrc:  Temporal  Temporal  SpO2: 100% 96% 99% 99%  Weight:      Height:         Start Time: 1047 hrs. End Time: 1058 hrs.  Imaging Guidance (Non-Spinal):          Type of Imaging Technique: Fluoroscopy Guidance (Non-Spinal) Indication(s): Fluoroscopy guidance for needle placement to enhance accuracy in procedures requiring precise needle localization for targeted delivery of medication in or near specific anatomical locations not easily accessible without such real-time imaging assistance. Exposure Time: Please see nurses notes. Contrast: Before injecting any contrast, we confirmed that the patient did not have an allergy to iodine , shellfish, or radiological contrast. Once satisfactory needle placement was completed at the desired level, radiological contrast was injected. Contrast injected under live fluoroscopy. No contrast complications. See chart for type and volume of contrast used. Fluoroscopic Guidance: I was personally present during the use of fluoroscopy. Tunnel Vision Technique used to obtain the best possible view of the target area. Parallax error corrected before commencing the procedure. Direction-depth-direction technique used to introduce the needle under continuous pulsed fluoroscopy. Once target was reached, antero-posterior, oblique, and lateral fluoroscopic projection used confirm needle placement in all planes. Images permanently stored in EMR. Interpretation: I personally interpreted the imaging intraoperatively. Adequate needle placement confirmed in multiple planes. Appropriate spread of contrast into desired area was observed. No evidence of afferent or efferent intravascular uptake. Permanent images saved into the patient's record.  Post-operative Assessment:   Post-procedure Vital Signs:  Pulse/HCG Rate: 8461 Temp: 97.7 F (36.5 C) Resp: 14 BP: (!) 140/84 SpO2: 99 %  EBL: None  Complications: No immediate post-treatment complications observed by team, or reported by patient.  Note: The patient tolerated the entire procedure well. A repeat set of vitals were taken after the procedure and the patient was kept under observation following institutional policy, for this type of procedure. Post-procedural neurological assessment was performed, showing return to baseline, prior to discharge. The patient was provided  with post-procedure discharge instructions, including a section on how to identify potential problems. Should any problems arise concerning this procedure, the patient was given instructions to immediately contact us , at any time, without hesitation. In any case, we plan to contact the patient by telephone for a follow-up status report regarding this interventional procedure.  Comments:  No additional relevant information.  Plan of Care (POC)  Orders:  Orders Placed This Encounter  Procedures   HIP INJECTION    Scheduling Instructions:     Side: Bilateral     Sedation: With Sedation.     Date: 02/13/2024   DG PAIN [facility] C-ARM 1-60 MIN NO REPORT    Intraoperative interpretation by procedural physician at [facility].    Standing Status:   Standing    Number of Occurrences:   1    Reason for exam::   Assistance in needle guidance and placement for procedures requiring needle placement in or near specific anatomical locations not easily accessible without such assistance.   Informed Consent Details: Physician/Practitioner Attestation; Transcribe to consent form and obtain patient signature    Nursing Order: Transcribe to consent form and obtain patient signature. Note: Always confirm laterality of pain with Ms. Sammie, before procedure.    Physician/Practitioner attestation of informed consent for procedure/surgical case:    I, the physician/practitioner, attest that I have discussed with the patient the benefits, risks, side effects, alternatives, likelihood of achieving goals and potential problems during recovery for the procedure that I have provided informed consent.    Procedure:   Hip injection    Physician/Practitioner performing the procedure:   Onna Nodal A. Kei Langhorst, MD    Indication/Reason:   Hip Joint Pain (Arthralgia)   Provide equipment / supplies at bedside    Procedure tray: Block Tray (Disposable  single use) Skin infiltration needle: Regular 1.5-in, 25-G, (x1) Block Needle type: Spinal Amount/quantity: 2 Size: Long (7-inch) Gauge: 22G    Standing Status:   Standing    Number of Occurrences:   1    Specify:   Block Tray   Saline lock IV    Have LR [phone] mL available and administer at 125 mL/hr if patient becomes hypotensive.    Standing Status:   Standing    Number of Occurrences:   1     Opioid Analgesic(s): Hydrocodone /APAP 7.5/325 1 tablet p.o. 3 times daily (22.5 mg/day of hydrocodone )  MME/day: 22.5 mg/day.    Medications ordered for procedure: Meds ordered this encounter  Medications   iohexol  (OMNIPAQUE ) 180 MG/ML injection 10 mL    Must be Myelogram-compatible. If not available, you may substitute with a water-soluble, non-ionic, hypoallergenic, myelogram-compatible radiological contrast medium.   lidocaine  (XYLOCAINE ) 2 % (with pres) injection 400 mg   pentafluoroprop-tetrafluoroeth (GEBAUERS) aerosol   midazolam  (VERSED ) 5 MG/5ML injection 0.5-2 mg    Make sure Flumazenil is available in the pyxis when using this medication. If oversedation occurs, administer 0.2 mg IV over 15 sec. If after 45 sec no response, administer 0.2 mg again over 1 min; may repeat at 1 min intervals; not to exceed 4 doses (1 mg)   fentaNYL  (SUBLIMAZE ) injection 25-50 mcg    Make sure Narcan  is available in the pyxis when using this medication. In the event of respiratory depression (RR< 8/min):  Titrate NARCAN  (naloxone ) in increments of 0.1 to 0.2 mg IV at 2-3 minute intervals, until desired degree of reversal.   methylPREDNISolone  acetate (DEPO-MEDROL ) injection 160 mg   ropivacaine  (PF) 2 mg/mL (0.2%) (  NAROPIN ) injection 18 mL   Medications administered: We administered iohexol , lidocaine , midazolam , fentaNYL , methylPREDNISolone  acetate, and ropivacaine  (PF) 2 mg/mL (0.2%).  See the medical record for exact dosing, route, and time of administration.    Interventional Therapies  Risk  Complexity Considerations:   WNL   Planned  Pending:   Therapeutic bilateral IA hip + bilateral trochanteric bursa inj. R5L4    Under consideration:   Diagnostic right L2-3 LESI #3  Therapeutic bilateral lumbar facet medial branch RFA #3    Completed:   Diagnostic right L2-3 LESI x2 (01/23/2024) (100/100/60/60-100)  Therapeutic bilateral IA hip + bilateral trochanteric bursa inj. R4L3 (06/27/2023) (100/100/70/70)  Therapeutic bilateral lumbar facet MBB x3 (09/08/2020) (4-0) (100/100/60/60)  Therapeutic right lumbar facet RFA x2 (09/09/2021) (4-2) (100/100/90/>75)  Therapeutic left lumbar facet RFA x2 (10/07/2021) (4-0) (100/100/90/>75)  Therapeutic right IA hip inj. x2 (08/20/2020) (5-1) (100/100/80/80)  Therapeutic left IA hip inj. x1 (08/20/2020) (5-1) (100/100/80/80)  Therapeutic bilateral trochanteric bursa inj. x3 (04/07/2022)  (100/100/90/90)  Neurosurgery consult for evaluation of dynamic lumbar spondylosis and possible surgical alternatives (09/20/2023)   Therapeutic  Palliative (PRN) options:   Palliative lumbar facet MBB   Palliative lumbar facet RFA  Palliative trochanteric bursa inj.    Pharmacotherapy  Nonopioids transferred 05/11/2020: Baclofen  and Mobic        Follow-up plan:   Return in about 2 weeks (around 02/27/2024) for (Face2F), (PPE).     Recent Visits Date Type Provider Dept  02/06/24 Office Visit Tanya Glisson, MD [facility]  01/23/24  Procedure visit Tanya Glisson, MD [facility]  01/17/24 Office Visit Patel, Seema K, NP [facility]  12/18/23 Office Visit Patel, Seema K, NP [facility]  Showing recent visits within past 90 days and meeting all other requirements Today's Visits Date Type Provider Dept  02/13/24 Procedure visit Tanya Glisson, MD [facility]  Showing today's visits and meeting all other requirements Future Appointments Date Type Provider Dept  02/27/24 Appointment Tanya Glisson, MD [facility]  03/19/24 Appointment Patel, Seema K, NP [facility]  Showing future appointments within next 90 days and meeting all other requirements   Disposition: Discharge home  Discharge (Date  Time): 02/13/2024; 1133 hrs.   Primary Care Physician: Chandra Toribio POUR, MD Location: [facility] Note by: Glisson DELENA Tanya, MD (TTS technology used. I apologize for any typographical errors that were not detected and corrected.) Date: 02/13/2024; Time: 11:59 AM  Disclaimer:  Medicine is not an Visual merchandiser. The only guarantee in medicine is that nothing is guaranteed. It is important to note that the decision to proceed with this intervention was based on the information collected from the patient. The Data and conclusions were drawn from the patient's questionnaire, the interview, and the physical examination. Because the information was provided in large part by the patient, it cannot be guaranteed that it has not been purposely or unconsciously manipulated. Every effort has been made to obtain as much relevant data as possible for this evaluation. It is important to note that the conclusions that lead to this procedure are derived in large part from the available data. Always take into account that the treatment will also be dependent on availability of resources and existing treatment guidelines, considered by other Pain  Management Practitioners as being common knowledge and practice, at the time of the intervention. For Medico-Legal purposes, it is also important to point out that variation in procedural techniques and pharmacological choices are  the acceptable norm. The indications, contraindications, technique, and results of the above procedure should only be interpreted and judged by a Board-Certified Interventional Pain Specialist with extensive familiarity and expertise in the same exact procedure and technique.

## 2024-02-13 NOTE — Patient Instructions (Signed)

## 2024-02-14 ENCOUNTER — Telehealth: Payer: Self-pay

## 2024-02-14 NOTE — Telephone Encounter (Signed)
 Post procedure follow up. Patient states she is fine.

## 2024-02-16 ENCOUNTER — Other Ambulatory Visit: Payer: Self-pay

## 2024-02-16 NOTE — Progress Notes (Signed)
 Referring Physician:  Chandra Toribio POUR, MD [address],  [place] [address]  Primary Physician:  Chandra Toribio POUR, MD  History of Present Illness: [name] has a history of addison's disease, chronic pain syndrome, obesity, prediabetes, dyslipidemia.   She sees pain management [facility]).   She has known pars at L5 with slip at L5-S1 (11mm on neutral and 14mm on flexion) with left paracentral disc contacting left L5 nerve root along with mild bilateral foraminal stenosis L5-S1.   She was sent to PT after we discussed her MRI results. She did not go.  She had repeat lumbar ESI in June and bilateral GTB injections in July with pain management.   She is here for follow up.   She had improvement with above injections. Her hip and right leg pain are better but she continues with intermittent LBP. She is worse in the morning and at night. Pain is worse with standing, walking, and bending. No numbness, tingling, or weakness.   She has known carpal tunnel in both hands.   She is taking norco, mobic , and zanaflex .   She does not smoke. Quit in 1996.   Bowel/Bladder Dysfunction: none, she has occasional urinary urgency  Conservative measures:  Physical therapy:  has not participated in PT in years (2019) Multimodal medical therapy including regular antiinflammatories:  hydrocodone , meloxicam , tizanidine  Injections:   02/13/24: bilateral greater trochanteric bursa injections 01/23/24: Right L2-L3 IL ESI  09/14/23: Right L2-3 ESI  06/27/23: Bilateral Bursitis Injection 03/09/23: Left L2-S1 RFA 02/21/23: Right L2-S1 RFA 09/06/22: Bilateral Hip injection  Past Surgery: no spinal surgeries  Denise Burns has no symptoms of cervical myelopathy.  The symptoms are causing a significant impact on the patient's life.   Review of Systems:  A 10 point review of systems is negative, except for the pertinent positives and negatives detailed in the HPI.  Past Medical  History: Past Medical History:  Diagnosis Date   Addison disease (HCC)    Arthritis    Back pain    Complication of anesthesia    nausea   GERD (gastroesophageal reflux disease)    History of kidney stones     Past Surgical History: Past Surgical History:  Procedure Laterality Date   CHOLECYSTECTOMY  2010   COLONOSCOPY WITH PROPOFOL  N/A 06/08/2022   Procedure: COLONOSCOPY WITH PROPOFOL ;  Surgeon: Therisa Bi, MD;  Location: [facility] ENDOSCOPY;  Service: Gastroenterology;  Laterality: N/A;   CYSTOSCOPY W/ RETROGRADES Right 08/09/2021   Procedure: CYSTOSCOPY WITH RETROGRADE PYELOGRAM;  Surgeon: Penne Knee, MD;  Location: [facility] ORS;  Service: Urology;  Laterality: Right;   CYSTOSCOPY W/ URETERAL STENT REMOVAL Right 08/09/2021   Procedure: CYSTOSCOPY WITH STENT REMOVAL;  Surgeon: Penne Knee, MD;  Location: [facility] ORS;  Service: Urology;  Laterality: Right;   CYSTOSCOPY/URETEROSCOPY/HOLMIUM LASER/STENT PLACEMENT Right 06/28/2021   Procedure: CYSTOSCOPY/URETEROSCOPY/STENT PLACEMENT;  Surgeon: Penne Knee, MD;  Location: [facility] ORS;  Service: Urology;  Laterality: Right;   CYSTOSCOPY/URETEROSCOPY/HOLMIUM LASER/STENT PLACEMENT Right 08/09/2021   Procedure: CYSTOSCOPY/URETEROSCOPY/HOLMIUM LASER/STENT PLACEMENT;  Surgeon: Penne Knee, MD;  Location: [facility] ORS;  Service: Urology;  Laterality: Right;   dental work     DIAGNOSTIC LAPAROSCOPY  2004   endometriosis dx   DILATION AND CURETTAGE OF UTERUS  2010   IR NEPHROSTOMY PLACEMENT LEFT  12/14/2020   NEPHROLITHOTOMY Left 12/14/2020   Procedure: NEPHROLITHOTOMY PERCUTANEOUS;  Surgeon: Penne Knee, MD;  Location: [facility] ORS;  Service: Urology;  Laterality: Left;   POLYPECTOMY     WISDOM  TOOTH EXTRACTION      Allergies: Allergies as of 02/20/2024 - Review Complete 02/13/2024  Allergen Reaction Noted   Tramadol Other (See Comments) 04/30/2018   Lisinopril Rash and Cough 04/30/2018   Sulfa antibiotics Other (See Comments) 04/30/2018     Medications: Outpatient Encounter Medications as of 02/20/2024  Medication Sig   Azelastine  HCl 137 MCG/SPRAY SOLN Place 1 spray into each nostril 2 (two) times daily.   cetirizine (ZYRTEC) 10 MG tablet Take 10 mg by mouth daily.   doxycycline  (VIBRA -TABS) 100 MG tablet Take 1 tablet (100 mg total) by mouth daily for 30 days, THEN 1 tablet (100 mg total) every other day.   escitalopram  (LEXAPRO ) 5 MG tablet Take 1 tablet (5 mg total) by mouth daily.   fludrocortisone  (FLORINEF ) 0.1 MG tablet Take 0.5 tablets (0.05 mg total) by mouth daily.   fluticasone  (FLONASE  SENSIMIST) 27.5 MCG/SPRAY nasal spray Place 1 spray into the nose in the morning and at bedtime.   HYDROcodone -acetaminophen  (NORCO) 7.5-325 MG tablet Take 1 tablet by mouth every 8 (eight) hours as needed for severe pain (pain score 7-10). Must last 30 days.   HYDROcodone -acetaminophen  (NORCO) 7.5-325 MG tablet Take 1 tablet by mouth every 8 (eight) hours as needed for severe pain (pain score 7-10). Must last 30 days.   [START ON 02/22/2024] HYDROcodone -acetaminophen  (NORCO) 7.5-325 MG tablet Take 1 tablet by mouth every 8 (eight) hours as needed for severe pain (pain score 7-10). Must last 30 days.   hydrocortisone  (CORTEF ) 10 MG tablet Take 1.5 tablets (15 mg total) by mouth daily with breakfast AND 0.5 tablets (5 mg total) daily in the afternoon.   lansoprazole  (PREVACID ) 30 MG capsule Take 1 capsule (30 mg total) by mouth daily at 12 noon.   meloxicam  (MOBIC ) 15 MG tablet Take 1 tablet (15 mg total) by mouth daily.   Methen-Hyosc-Meth Blue-Na Phos (UROGESIC-BLUE) 81.6 MG TABS Take 1 tablet (81.6 mg total) by mouth 4 (four) times daily as needed.   naloxone  (NARCAN ) nasal spray 4 mg/0.1 mL Place 1 spray into the nose as needed for opioid-induced respiratory depresssion. In case of emergency (overdose), spray once into each nostril. If no response within 3 minutes, repeat application and call 911.   omega-3 acid ethyl esters (LOVAZA)  1 g capsule Take 1 g by mouth daily.   pseudoephedrine  (SUDAFED) 30 MG tablet Take 30 mg by mouth every 12 (twelve) hours as needed for congestion.    rosuvastatin  (CRESTOR ) 5 MG tablet Take 1 tablet (5 mg total) by mouth daily.   tiZANidine  (ZANAFLEX ) 4 MG tablet Take 1 tablet (4 mg total) by mouth every 8 (eight) hours as needed for muscle spasms.   No facility-administered encounter medications on file as of 02/20/2024.    Social History: Social History   Tobacco Use   Smoking status: Former    Current packs/day: 0.00    Types: Cigarettes    Quit date: 08/01/1994    Years since quitting: 29.5   Smokeless tobacco: Never  Vaping Use   Vaping status: Never Used  Substance Use Topics   Alcohol use: Not Currently    Comment: once per 6 months   Drug use: Never    Family Medical History: Family History  Problem Relation Age of Onset   Arthritis Mother    Hyperlipidemia Mother    Hypertension Mother    High blood pressure Mother    Lung cancer Mother    Liver cancer Mother    Brain cancer  Mother    Alcohol abuse Father    Hearing loss Father    Hyperlipidemia Father    High blood pressure Father    Colon polyps Maternal Grandmother 66   Colon cancer Neg Hx    Esophageal cancer Neg Hx    Stomach cancer Neg Hx    Rectal cancer Neg Hx     Physical Examination: There were no vitals filed for this visit.    Awake, alert, oriented to person, place, and time.  Speech is clear and fluent. Fund of knowledge is appropriate.   Cranial Nerves: Pupils equal round and reactive to light.  Facial tone is symmetric.   No posterior lumbar tenderness.   No abnormal lesions on exposed skin.   Strength: Side Biceps Triceps Deltoid Interossei Grip Wrist Ext. Wrist Flex.  R 5 5 5 5 5 5 5   L 5 5 5 5 5 5 5    Side Iliopsoas Quads Hamstring PF DF EHL  R 5 5 5 5 5 5   L 5 5 5 5 5 5    Reflexes are 2+ and symmetric at the biceps, brachioradialis, patella and achilles.   Hoffman's is  absent.  Clonus is not present.   Bilateral upper and lower extremity sensation is intact to light touch.     She has no pain with IR/ER of both hips.   Gait is normal.     Medical Decision Making  Imaging: none  Assessment and Plan: Denise Burns had improvement with lumbar and GTB injections.  Her hip and right leg pain are better but she continues with intermittent LBP. She is worse in the morning and at night. Pain is worse with standing, walking, and bending. No numbness, tingling, or weakness.   She has known slip at L5-S1 measuring 11 mm on neutral and extension and 14 mm on flexion.   Treatment options discussed with patient and following plan made:   - She would like to see Dr. Clois to discuss surgical options. Appointment scheduled.  - She is thinking of scheduling surgery in September/October.  - No improvement with time, medications, or injections. No recent PT, however she has instability with slip at L5-S1 measuring 11 mm on neutral and extension and 14 mm on flexion.   I spent a total of 30 minutes in face-to-face and non-face-to-face activities related to this patient's care today including review of outside records, review of imaging, review of symptoms, physical exam, discussion of differential diagnosis, discussion of treatment options, and documentation.   Glade Boys PA-C Dept. of Neurosurgery

## 2024-02-20 ENCOUNTER — Ambulatory Visit: Admitting: Orthopedic Surgery

## 2024-02-20 ENCOUNTER — Encounter: Payer: Self-pay | Admitting: Orthopedic Surgery

## 2024-02-20 ENCOUNTER — Telehealth: Payer: Self-pay

## 2024-02-20 VITALS — BP 124/68 | Ht 64.0 in | Wt 179.0 lb

## 2024-02-20 DIAGNOSIS — M51362 Other intervertebral disc degeneration, lumbar region with discogenic back pain and lower extremity pain: Secondary | ICD-10-CM

## 2024-02-20 DIAGNOSIS — M4316 Spondylolisthesis, lumbar region: Secondary | ICD-10-CM

## 2024-02-20 NOTE — Telephone Encounter (Signed)
 Left message at [facility] office asking for office notes on the patient to be faxed to us  prior to her appointment with us  today if possible.

## 2024-02-27 ENCOUNTER — Encounter: Payer: Self-pay | Admitting: Pain Medicine

## 2024-02-27 ENCOUNTER — Ambulatory Visit: Attending: Pain Medicine | Admitting: Pain Medicine

## 2024-02-27 VITALS — BP 131/92 | HR 90 | Temp 97.3°F | Resp 18 | Ht 64.0 in | Wt 175.0 lb

## 2024-02-27 DIAGNOSIS — M25552 Pain in left hip: Secondary | ICD-10-CM | POA: Insufficient documentation

## 2024-02-27 DIAGNOSIS — M76892 Other specified enthesopathies of left lower limb, excluding foot: Secondary | ICD-10-CM | POA: Diagnosis not present

## 2024-02-27 DIAGNOSIS — M25551 Pain in right hip: Secondary | ICD-10-CM | POA: Insufficient documentation

## 2024-02-27 DIAGNOSIS — R1031 Right lower quadrant pain: Secondary | ICD-10-CM | POA: Diagnosis not present

## 2024-02-27 DIAGNOSIS — G8929 Other chronic pain: Secondary | ICD-10-CM | POA: Diagnosis not present

## 2024-02-27 DIAGNOSIS — R937 Abnormal findings on diagnostic imaging of other parts of musculoskeletal system: Secondary | ICD-10-CM | POA: Insufficient documentation

## 2024-02-27 DIAGNOSIS — Z09 Encounter for follow-up examination after completed treatment for conditions other than malignant neoplasm: Secondary | ICD-10-CM | POA: Insufficient documentation

## 2024-02-27 DIAGNOSIS — M7061 Trochanteric bursitis, right hip: Secondary | ICD-10-CM | POA: Insufficient documentation

## 2024-02-27 DIAGNOSIS — M7062 Trochanteric bursitis, left hip: Secondary | ICD-10-CM | POA: Diagnosis not present

## 2024-02-27 NOTE — Progress Notes (Signed)
 Referring Physician:  Chandra Toribio POUR, MD [address] [place],  [place] [address]  Primary Physician:  Chandra Toribio POUR, MD  History of Present Illness: 03/05/2024 [name] is here today with a chief complaint of back and leg pain.  Her right leg gives her trouble with pain in her buttock and down the back of her thigh when she rotates in certain directions.  She also has some pain with walking.  She gets low back pain as well with activity.  Her symptoms have been slowly worsening over time.  She is about to start physical therapy, and had recent injections in her hips which have helped her hip pain.  The symptoms are causing a significant impact on the patient's life.   I have utilized the care everywhere function in epic to review the outside records available from external health systems.  Progress Note from Glade Boys, [place] on 02/20/24:  History of Present Illness: [name] has a history of addison's disease, chronic pain syndrome, obesity, prediabetes, dyslipidemia.    She sees pain management [facility]).    She has known pars at L5 with slip at L5-S1 (11mm on neutral and 14mm on flexion) with left paracentral disc contacting left L5 nerve root along with mild bilateral foraminal stenosis L5-S1.    She was sent to PT after we discussed her MRI results. She did not go.   She had repeat lumbar ESI in June and bilateral GTB injections in July with pain management.    She is here for follow up.    She had improvement with above injections. Her hip and right leg pain are better but she continues with intermittent LBP. She is worse in the morning and at night. Pain is worse with standing, walking, and bending. No numbness, tingling, or weakness.    She has known carpal tunnel in both hands.    She is taking norco, mobic , and zanaflex .    She does not smoke. Quit in 1996.    Bowel/Bladder Dysfunction: none, she has occasional urinary urgency   Conservative  measures:  Physical therapy:  has not participated in PT in years (2019) Multimodal medical therapy including regular antiinflammatories:  hydrocodone , meloxicam , tizanidine  Injections:   02/13/24: bilateral greater trochanteric bursa injections 01/23/24: Right L2-L3 IL ESI  09/14/23: Right L2-3 ESI  06/27/23: Bilateral Bursitis Injection 03/09/23: Left L2-S1 RFA 02/21/23: Right L2-S1 RFA 09/06/22: Bilateral Hip injection   Past Surgery: no spinal surgeries  Review of Systems:  A 10 point review of systems is negative, except for the pertinent positives and negatives detailed in the HPI.  Past Medical History: Past Medical History:  Diagnosis Date   Addison disease (HCC)    Arthritis    Back pain    Complication of anesthesia    nausea   GERD (gastroesophageal reflux disease)    History of kidney stones     Past Surgical History: Past Surgical History:  Procedure Laterality Date   CHOLECYSTECTOMY  2010   COLONOSCOPY WITH PROPOFOL  N/A 06/08/2022   Procedure: COLONOSCOPY WITH PROPOFOL ;  Surgeon: Therisa Bi, MD;  Location: [facility] ENDOSCOPY;  Service: Gastroenterology;  Laterality: N/A;   CYSTOSCOPY W/ RETROGRADES Right 08/09/2021   Procedure: CYSTOSCOPY WITH RETROGRADE PYELOGRAM;  Surgeon: Penne Knee, MD;  Location: [facility];  Service: Urology;  Laterality: Right;   CYSTOSCOPY W/ URETERAL STENT REMOVAL Right 08/09/2021   Procedure: CYSTOSCOPY WITH STENT REMOVAL;  Surgeon: Penne Knee, MD;  Location: [facility] OR[facility];  Service: Urology;  Laterality: Right;   CYSTOSCOPY/URETEROSCOPY/HOLMIUM LASER/STENT PLACEMENT Right 06/28/2021   Procedure: CYSTOSCOPY/URETEROSCOPY/STENT PLACEMENT;  Surgeon: Penne Knee, MD;  Location: [facility] OR[facility];  Service: Urology;  Laterality: Right;   CYSTOSCOPY/URETEROSCOPY/HOLMIUM LASER/STENT PLACEMENT Right 08/09/2021   Procedure: CYSTOSCOPY/URETEROSCOPY/HOLMIUM LASER/STENT PLACEMENT;  Surgeon: Penne Knee, MD;  Location: [facility] OR[facility];  Service: Urology;  Laterality:  Right;   dental work     DIAGNOSTIC LAPAROSCOPY  2004   endometriosis dx   DILATION AND CURETTAGE OF UTERUS  2010   IR NEPHROSTOMY PLACEMENT LEFT  12/14/2020   NEPHROLITHOTOMY Left 12/14/2020   Procedure: NEPHROLITHOTOMY PERCUTANEOUS;  Surgeon: Penne Knee, MD;  Location: [facility] ORS;  Service: Urology;  Laterality: Left;   POLYPECTOMY     WISDOM TOOTH EXTRACTION      Allergies: Allergies as of 03/05/2024 - Review Complete 03/05/2024  Allergen Reaction Noted   Tramadol Other (See Comments) 04/30/2018   Lisinopril Rash and Cough 04/30/2018   Sulfa antibiotics Other (See Comments) 04/30/2018    Medications:  Current Outpatient Medications:    Azelastine  HCl 137 MCG/SPRAY SOLN, Place 1 spray into each nostril 2 (two) times daily., Disp: 30 mL, Rfl: 1   cetirizine (ZYRTEC) 10 MG tablet, Take 10 mg by mouth daily., Disp: , Rfl:    escitalopram  (LEXAPRO ) 5 MG tablet, Take 1 tablet (5 mg total) by mouth daily., Disp: 90 tablet, Rfl: 1   fludrocortisone  (FLORINEF ) 0.1 MG tablet, Take 0.5 tablets (0.05 mg total) by mouth daily., Disp: 45 tablet, Rfl: 3   fluticasone  (FLONASE  SENSIMIST) 27.5 MCG/SPRAY nasal spray, Place 1 spray into the nose in the morning and at bedtime., Disp: , Rfl:    HYDROcodone -acetaminophen  (NORCO) 7.5-325 MG tablet, Take 1 tablet by mouth every 8 (eight) hours as needed for severe pain (pain score 7-10). Must last 30 days., Disp: 90 tablet, Rfl: 0   HYDROcodone -acetaminophen  (NORCO) 7.5-325 MG tablet, Take 1 tablet by mouth every 8 (eight) hours as needed for severe pain (pain score 7-10). Must last 30 days., Disp: 90 tablet, Rfl: 0   HYDROcodone -acetaminophen  (NORCO) 7.5-325 MG tablet, Take 1 tablet by mouth every 8 (eight) hours as needed for severe pain (pain score 7-10). Must last 30 days., Disp: 90 tablet, Rfl: 0   hydrocortisone  (CORTEF ) 10 MG tablet, Take 1.5 tablets (15 mg total) by mouth daily with breakfast AND 0.5 tablets (5 mg total) daily in the afternoon.,  Disp: 200 tablet, Rfl: 3   lansoprazole  (PREVACID ) 30 MG capsule, Take 1 capsule (30 mg total) by mouth daily at 12 noon., Disp: 90 capsule, Rfl: 1   meloxicam  (MOBIC ) 15 MG tablet, Take 1 tablet (15 mg total) by mouth daily., Disp: 90 tablet, Rfl: 1   Methen-Hyosc-Meth Blue-Na Phos (UROGESIC-BLUE) 81.6 MG TABS, Take 1 tablet (81.6 mg total) by mouth 4 (four) times daily as needed., Disp: 120 tablet, Rfl: 0   naloxone  (NARCAN ) nasal spray 4 mg/0.1 mL, Place 1 spray into the nose as needed for opioid-induced respiratory depresssion. In case of emergency (overdose), spray once into each nostril. If no response within 3 minutes, repeat application and call 911., Disp: 2 each, Rfl: 0   omega-3 acid ethyl esters (LOVAZA) 1 g capsule, Take 1 g by mouth daily., Disp: , Rfl:    pseudoephedrine  (SUDAFED) 30 MG tablet, Take 30 mg by mouth every 12 (twelve) hours as needed for congestion. , Disp: , Rfl:    rosuvastatin  (CRESTOR ) 5 MG tablet, Take 1 tablet (5 mg total) by mouth daily., Disp: 90  tablet, Rfl: 3   tiZANidine  (ZANAFLEX ) 4 MG tablet, Take 1 tablet (4 mg total) by mouth every 8 (eight) hours as needed for muscle spasms., Disp: 90 tablet, Rfl: 1  Social History: Social History   Tobacco Use   Smoking status: Former    Current packs/day: 0.00    Types: Cigarettes    Quit date: 08/01/1994    Years since quitting: 29.6   Smokeless tobacco: Never  Vaping Use   Vaping status: Never Used  Substance Use Topics   Alcohol use: Not Currently    Comment: once per 6 months   Drug use: Never    Family Medical History: Family History  Problem Relation Age of Onset   Arthritis Mother    Hyperlipidemia Mother    Hypertension Mother    High blood pressure Mother    Lung cancer Mother    Liver cancer Mother    Brain cancer Mother    Alcohol abuse Father    Hearing loss Father    Hyperlipidemia Father    High blood pressure Father    Colon polyps Maternal Grandmother 75   Colon cancer Neg Hx     Esophageal cancer Neg Hx    Stomach cancer Neg Hx    Rectal cancer Neg Hx     Physical Examination: Vitals:   03/05/24 0908  BP: 128/82    General: Patient is in no apparent distress. Attention to examination is appropriate.  Neck:   Supple.  Full range of motion.  Respiratory: Patient is breathing without any difficulty.   NEUROLOGICAL:     Awake, alert, oriented to person, place, and time.  Speech is clear and fluent.   Cranial Nerves: Pupils equal round and reactive to light.  Facial tone is symmetric.  Facial sensation is symmetric. Shoulder shrug is symmetric. Tongue protrusion is midline.  There is no pronator drift.  Strength: Side Biceps Triceps Deltoid Interossei Grip Wrist Ext. Wrist Flex.  R 5 5 5 5 5 5 5   L 5 5 5 5 5 5 5    Side Iliopsoas Quads Hamstring PF DF EHL  R 5 5 5 5 5 5   L 5 5 5 5 5 5    Reflexes are 2+ and symmetric at the biceps, triceps, brachioradialis, patella and achilles.   Hoffman's is absent.   Bilateral upper and lower extremity sensation is intact to light touch.    No evidence of dysmetria noted.  Gait is normal.     Medical Decision Making  Imaging: MRI L spine 10/10/2023 IMPRESSION: 1. Bilateral pars defects at L5 with stable anterolisthesis. Associated stable appearing severe bilateral facet disease at L5-S1, right greater than left. 2. Annular fissure and shallow left paracentral disc protrusion at L4-5 contacting the left side of the thecal sac and potentially irritating the left L5 nerve root. 3. Mild foraminal stenosis bilaterally at L5-S1.     Electronically Signed   By: MYRTIS Stammer M.D.   On: 11/05/2023 14:42  Xray 08/30/2023 IMPRESSION: 1. Anterolisthesis of L5 on S1 with prominent degenerative disc disease and facet hypertrophy. Anterolisthesis measures 11 mm on neutral and extension, 14 mm on flexion. The known pars defects on prior exam are not well demonstrated by radiograph. 2. Mild degenerative disc  disease at L3-L4 and L4-L5.     Electronically Signed   By: Andrea Gasman M.D.   On: 09/08/2023 16:34  I have personally reviewed the images and agree with the above interpretation.  Assessment and Plan: Ms.  Nihiser is a pleasant 53 y.o. female with pars defect at L5 with a significant spondylolisthesis.  She has back pain with right-sided sciatica.  There is certainly an element of instability given the findings on her x-rays.  She would like to start physical therapy, but I suspect she will ultimately need some form of intervention.  Given the current findings, an anterior lumbar interbody fusion followed by posterior fixation seems most appropriate.  However, we will try physical therapy first before making any decisions.  I will see her back in about 2 months.  I spent a total of 30 minutes in this patient's care today. This time was spent reviewing pertinent records including imaging studies, obtaining and confirming history, performing a directed evaluation, formulating and discussing my recommendations, and documenting the visit within the medical record.    Thank you for involving me in the care of this patient.      Shailynn Fong K. Clois MD, [facility] Neurosurgery

## 2024-02-27 NOTE — Progress Notes (Signed)
 Safety precautions to be maintained throughout the outpatient stay will include: orient to surroundings, keep bed in low position, maintain call bell within reach at all times, provide assistance with transfer out of bed and ambulation.

## 2024-02-27 NOTE — Progress Notes (Signed)
 PROVIDER NOTE: Interpretation of information contained herein should be left to medically-trained personnel. Specific patient instructions are provided elsewhere under Patient Instructions section of medical record. This document was created in part using AI and STT-dictation technology, any transcriptional errors that may result from this process are unintentional.  Patient: Denise Burns  Service: E/M   PCP: Chandra Toribio POUR, MD  DOB: 06-27-1971  DOS: 02/27/2024  Provider: Eric DELENA Como, MD  MRN: [id number]  Delivery: Face-to-face  Specialty: Interventional Pain Management  Type: Established Patient  Setting: Ambulatory outpatient facility  Specialty designation: 09  Referring Prov.: Chandra Toribio POUR, MD  Location: Outpatient office facility       History of present illness (HPI) [name], a 53 y.o. year old female, is here today because of her Chronic hip pain, bilateral [M25.551, M25.552, G89.29]. Ms. Taglieri's primary complain today is Hip Pain and Back Pain  Pertinent problems: [name] has Chronic midline low back pain with bilateral sciatica; Chronic low back pain (1ry area of Pain) (Bilateral) (R>L) w/o sciatica; Chronic sacroiliac joint pain (2ry area of Pain) (Right); Chronic pain syndrome; Abnormal MRI, lumbar spine (11/05/2023); Lumbar facet hypertrophy (Multilevel) (Bilateral); Lumbar facet syndrome (Bilateral); Spondylolisthesis (8 mm) at L5-S1 level; Lumbar Grade 1 Retrolisthesis (2 mm) of L2/L3; L5-S1 pars defect w/ spondylolisthesis (Bilateral); Lumbar pars defect (L5-S1) (Bilateral); Lumbar foraminal stenosis (Left: L2-3) (Bilateral: L5-S1); Osteoarthritis of sacroiliac joint (Bilateral); Chronic musculoskeletal pain; Neurogenic pain; Osteoarthritis of facet joint of lumbar spine; Greater trochanteric bursitis (Bilateral); Lumbar spondylosis; Spondylosis without myelopathy or radiculopathy, lumbosacral region; Chronic hip pain (Right); Osteoarthritis of hip  (Right); Lumbar radiculitis (L1/L2) (Right); Chronic groin pain (Right); Chronic hip pain (Bilateral); Enthesopathy hip region (Left); Osteoarthritis of hips (Bilateral); Kissing spine syndrome; Chronic low back pain (Midline) w/o sciatica; Trigger point with back pain (PSIS area) (Right); Lumbar facet joint pain; Broken toes (Right); Closed displaced fracture of distal phalanx of lesser toe of right foot; Chronic lower extremity pain (Right); Lumbosacral radiculopathy at L2 (Right); Lumbosacral radiculopathy at L3 (Right); Weakness of lower extremity; DDD (degenerative disc disease), lumbosacral w/ LBP and LEP; Pain of both elbows; Lumbosacral Anterolisthesis of L5/S1 (Dynamic) (N/E:57mm  F:3mm); Multifactorial low back pain; and Low back pain of over 3 months duration on their pertinent problem list.  Pain Assessment: Severity of Chronic pain is reported as a 3 /10. Location: Hip Right, Left/Radiates down both thighs, Injections helped.. Onset: More than a month ago. Quality: Aching, Burning, Constant. Timing: Constant. Modifying factor(s): Resting, heat and Ice, medication. Vitals:  height is 5' 4 (1.626 m) and weight is 175 lb (79.4 kg). Her temperature is 97.3 F (36.3 C) (abnormal). Her blood pressure is 131/92 (abnormal) and her pulse is 90. Her respiration is 18 and oxygen saturation is 99%.  BMI: Estimated body mass index is 30.04 kg/m as calculated from the following:   Height as of this encounter: 5' 4 (1.626 m).   Weight as of this encounter: 175 lb (79.4 kg).  Last encounter: 02/06/2024. Last procedure: 02/13/2024.  Reason for encounter: post-procedure evaluation and assessment.   Discussed the use of AI scribe software for clinical note transcription with the patient, who gave verbal consent to proceed.  History of Present Illness   Denise Burns is a 53 year old female who presents for follow-up after bilateral hip and trochanteric bursa injections.  She returns for  follow-up after receiving bilateral interarticular hip joint injections and bilateral trochanteric bursa injections on February 13, 2024,  under fluoroscopic guidance and IV sedation. She achieved 100% pain relief initially with the local anesthetic, followed by a sustained 75% improvement. She is now able to cross her legs and walk up steps without pain.  She continues to experience pain in the lower back, which radiates into her buttocks but does not extend down her legs. She recalls undergoing lumbar facet radiofrequency treatment in the past, with the last known procedure occurring in 2020, although she believes another treatment was done around the same time last year. No radiation of pain down the legs.      Post-Procedure Evaluation   Type: Hip & bursae injection R5L4  Laterality: Bilateral (-50)  Bursae: Trochanteric  Laterality: Bilateral (-50)  Approach: Percutaneous posterolateral approach. Level: Lower pelvic and hip joint level.  Imaging: Fluoroscopy-guided Non-spinal ([id number]) Anesthesia: Local anesthesia (1-2% Lidocaine ) Anxiolysis: IV Versed  3.0 mg Sedation: Moderate Sedation Fentanyl  1 mL (50 mcg) DOS: 02/13/2024  Performed by: Eric DELENA Como, MD  Purpose: Diagnostic/Therapeutic Indications: Hip pain severe enough to impact quality of life or function. Rationale (medical necessity): procedure needed and proper for the diagnosis and/or treatment of Ms. Bruster's medical symptoms and needs. 1. Chronic hip pain (Bilateral)   2. Chronic groin pain (Right)   3. Enthesopathy hip region (Left)   4. Greater trochanteric bursitis (Bilateral)   5. Osteoarthritis of hips (Bilateral)    NAS-11 Pain score:   Pre-procedure: 4 /10   Post-procedure: 0-No pain/10       Effectiveness:  Initial hour after procedure: 100 %. Subsequent 4-6 hours post-procedure: 100 %. Analgesia past initial 6 hours: 75 %. Ongoing improvement:  Analgesic: The patient indicates having attained  100% relief of the pain for the duration of the local anesthetic confirming the pain to be coming from the hip joint area.  In addition she describes having an ongoing 75% improvement in the pain that she was experiencing on both hips and groin area.  She refers that currently she is having some discomfort in the area of the lower back. Function: Ms. Wittke reports improvement in function ROM: Ms. Mendel reports improvement in ROM  Pharmacotherapy Assessment   Opioid Analgesic(s): Hydrocodone /APAP 7.5/325 1 tablet p.o. 3 times daily (22.5 mg/day of hydrocodone )  MME/day: 22.5 mg/day.   Monitoring: [place] PMP: PDMP reviewed during this encounter.       Pharmacotherapy: No side-effects or adverse reactions reported. Compliance: No problems identified. Effectiveness: Clinically acceptable.  Erlene Doyal [place], [place]  02/27/2024  3:02 PM  Sign when Signing Visit Safety precautions to be maintained throughout the outpatient stay will include: orient to surroundings, keep bed in low position, maintain call bell within reach at all times, provide assistance with transfer out of bed and ambulation.     UDS:  Summary  Date Value Ref Range Status  03/22/2023 Note  Final    Comment:    ==================================================================== [employer] [facility] (MW) ==================================================================== Test                             Result       Flag       Units  Drug Present and Declared for Prescription Verification   Hydrocodone                     1894         EXPECTED   ng/mg creat   Hydromorphone   329          EXPECTED   ng/mg creat   Dihydrocodeine                 292          EXPECTED   ng/mg creat   Norhydrocodone                 1758         EXPECTED   ng/mg creat    Sources of hydrocodone  include scheduled prescription medications.    Hydromorphone , dihydrocodeine and norhydrocodone are expected    metabolites of hydrocodone .  Hydromorphone  and dihydrocodeine are    also available as scheduled prescription medications.    Oxymorphone                    335          EXPECTED   ng/mg creat   Noroxymorphone                 134          EXPECTED   ng/mg creat    Sources of oxymorphone are scheduled prescription medications;    oxymorphone is also a metabolite of oxycodone . Noroxymorphone is an    expected metabolite of oxymorphone.  Drug Absent but Declared for Prescription Verification   Oxycodone                       Not Detected UNEXPECTED ng/mg creat    Oxycodone  is almost always present in patients taking this drug    consistently.  Absence of oxycodone  could be due to lapse of time    since the last dose or unusual pharmacokinetics (rapid metabolism).  ==================================================================== Test                      Result    Flag   Units      Ref Range   Creatinine              65               mg/dL      >=79 ==================================================================== Declared Medications:  The flagging and interpretation on this report are based on the  following declared medications.  Unexpected results may arise from  inaccuracies in the declared medications.   **Note: The testing scope of this panel includes these medications:   Hydrocodone  (Norco)  Oxycodone  (Percocet)   **Note: The testing scope of this panel does not include the  following reported medications:   Acetaminophen  (Norco)  Acetaminophen  (Percocet)  Azelastine   Cetirizine (Zyrtec)  Escitalopram  (Lexapro )  Fludrocortisone  (Florinef )  Fluticasone  (Flonase )  Hydrocortisone  (Cortef )  Hyoscyamine  Lansoprazole  (Prevacid )  Meloxicam  (Mobic )  Methenamine   Methylene Blue  Naloxone  (Narcan )  Omega-3 Fatty Acids  Pseudoephedrine  (Sudafed)  Supplement  Vitamin D2 ==================================================================== For clinical consultation, please call [phone]. ====================================================================     No results found for: CBDTHCR No results found for: D8THCCBX No results found for: D9THCCBX  ROS  Constitutional: Denies any fever or chills Gastrointestinal: No reported hemesis, hematochezia, vomiting, or acute GI distress Musculoskeletal: Denies any acute onset joint swelling, redness, loss of ROM, or weakness Neurological: No reported episodes of acute onset apraxia, aphasia, dysarthria, agnosia, amnesia, paralysis, loss of coordination, or loss of consciousness  Medication Review  Azelastine  HCl, HYDROcodone -acetaminophen , Urogesic-Blue, cetirizine, escitalopram , fludrocortisone , fluticasone , hydrocortisone , lansoprazole , meloxicam , naloxone , omega-3 acid ethyl esters, pseudoephedrine , rosuvastatin , and  tiZANidine   History Review  Allergy: Ms. Murton is allergic to tramadol, lisinopril, and sulfa antibiotics. Drug: Ms. Horvath  reports no history of drug use. Alcohol:  reports that she does not currently use alcohol. Tobacco:  reports that she quit smoking about 29 years ago. Her smoking use included cigarettes. She has never used smokeless tobacco. Social: Ms. Trawick  reports that she quit smoking about 29 years ago. Her smoking use included cigarettes. She has never used smokeless tobacco. She reports that she does not currently use alcohol. She reports that she does not use drugs. Medical:  has a past medical history of Addison disease (HCC), Arthritis, Back pain, Complication of anesthesia, GERD (gastroesophageal reflux disease), and History of kidney stones. Surgical: Ms. Buchberger  has a past surgical history that includes Cholecystectomy (2010); Polypectomy; dental work; Dilation and curettage of uterus (2010); IR NEPHROSTOMY PLACEMENT LEFT (12/14/2020); Nephrolithotomy (Left, 12/14/2020); Wisdom tooth extraction; Diagnostic laparoscopy (2004); Cystoscopy/ureteroscopy/holmium laser/stent  placement (Right, 06/28/2021); Cystoscopy w/ ureteral stent removal (Right, 08/09/2021); Cystoscopy w/ retrogrades (Right, 08/09/2021); Cystoscopy/ureteroscopy/holmium laser/stent placement (Right, 08/09/2021); and Colonoscopy with propofol  (N/A, 06/08/2022). Family: family history includes Alcohol abuse in her father; Arthritis in her mother; Brain cancer in her mother; Colon polyps (age of onset: 62) in her maternal grandmother; Hearing loss in her father; High blood pressure in her father and mother; Hyperlipidemia in her father and mother; Hypertension in her mother; Liver cancer in her mother; Lung cancer in her mother.  Laboratory Chemistry Profile   Renal Lab Results  Component Value Date   BUN 18 09/12/2023   CREATININE 0.91 09/12/2023   BCR 20 09/12/2023   GFR 84.36 08/26/2021   GFRAA 86 08/16/2018   GFRNONAA >60 09/18/2022    Hepatic Lab Results  Component Value Date   AST 25 09/12/2023   ALT 31 09/12/2023   ALBUMIN 4.4 09/12/2023   ALKPHOS 68 09/12/2023   LIPASE 38 09/16/2022    Electrolytes Lab Results  Component Value Date   NA 142 09/12/2023   K 3.9 09/12/2023   CL 104 09/12/2023   CALCIUM  9.5 09/12/2023   MG 2.2 09/18/2022   PHOS 4.3 09/18/2022    Bone Lab Results  Component Value Date   VD25OH 14.0 (L) 06/15/2022   25OHVITD1 67 08/16/2018   25OHVITD2 <1.0 08/16/2018   25OHVITD3 67 08/16/2018   TESTOSTERONE  1 (L) 08/13/2020    Inflammation (CRP: Acute Phase) (ESR: Chronic Phase) Lab Results  Component Value Date   CRP 2 08/16/2018   ESRSEDRATE 37 (H) 08/16/2018   LATICACIDVEN 1.9 09/16/2022         Note: Above Lab results reviewed.  Recent Imaging Review  DG PAIN CLINIC C-ARM 1-60 MIN NO REPORT Fluoro was used, but no Radiologist interpretation will be provided.  Please refer to NOTES tab for provider progress note. Note: Reviewed        Physical Exam  Vitals: BP (!) 131/92   Pulse 90   Temp (!) 97.3 F (36.3 C)   Resp 18   Ht 5' 4 (1.626 m)    Wt 175 lb (79.4 kg)   LMP 12/01/2020 (Approximate)   SpO2 99%   BMI 30.04 kg/m  BMI: Estimated body mass index is 30.04 kg/m as calculated from the following:   Height as of this encounter: 5' 4 (1.626 m).   Weight as of this encounter: 175 lb (79.4 kg). Ideal: Ideal body weight: 54.7 kg (120 lb 9.5 oz) Adjusted ideal body weight: 64.6 kg (142 lb 5.7  oz) General appearance: Well nourished, well developed, and well hydrated. In no apparent acute distress Mental status: Alert, oriented x 3 (person, place, & time)       Respiratory: No evidence of acute respiratory distress Eyes: PERLA   Assessment   Diagnosis Status  1. Chronic hip pain (Bilateral)   2. Chronic groin pain (Right)   3. Enthesopathy hip region (Left)   4. Greater trochanteric bursitis (Bilateral)   5. Postop check   6. Abnormal MRI, lumbar spine (03/22/2018)    Controlled Controlled Controlled   Updated Problems: Problem  Abnormal MRI, lumbar spine (11/05/2023)   (11/05/2023) LUMBAR MRI FINDINGS: Alignment: Bilateral pars defects at L5 with stable anterolisthesis. Vertebrae: Stable large L3 hemangioma.   DISC LEVELS: L1-2: Very small central disc protrusion no neural compression. No spinal or foraminal stenosis. Mild facet disease. L2-3: Mild facet disease but no disc protrusions, spinal or foraminal stenosis. L3-4: Moderate facet disease but no disc protrusions, spinal or foraminal stenosis. L4-5: Annular fissure and shallow left paracentral disc protrusion contacting the left side of the thecal sac and potentially irritating the left L5 nerve root. The exiting L4 nerve roots are normal. Moderate to advanced right-sided facet disease. L5-S1: Bilateral pars defects and associated severe bilateral facet disease, right greater than left (R>L) with mild mass effect on the posterolateral aspect of the thecal sac. Diffuse bulging uncovered disc and mild osteophytic ridging but the spinal canal is generous and there  is no significant spinal stenosis. Mild foraminal stenosis bilaterally.  IMPRESSION: 1. Bilateral pars defects at L5 with stable anterolisthesis. Associated stable appearing severe bilateral facet disease at L5-S1, right greater than left. 2. Annular fissure and shallow left paracentral disc protrusion at L4-5 contacting the left side of the thecal sac and potentially irritating the left L5 nerve root. 3. Mild foraminal stenosis bilaterally at L5-S1.  ______________________________________________________________   (03/22/2018) LUMBAR MRI FINDINGS: Alignment: Chronic bilateral pars defects at L5 with associated 8 mm spondylolisthesis of L5 on S1. Trace 2 mm retrolisthesis of L2 on L3. Alignment otherwise normal. Vertebrae: Vertebral body heights well maintained without evidence for acute or chronic fracture. Bone marrow signal intensity within normal limits. Well-circumscribed 2.3 cm T2 hyperintense lesion within the L3 vertebral body demonstrates internal trabeculation, felt to be most consistent with an atypical hemangioma. No other discrete or worrisome osseous lesions. Mild reactive endplate changes noted about the T11-12 interspace anteriorly.  Disc levels: T11-12: Seen only on sagittal projection. Diffuse disc bulge with disc desiccation and intervertebral disc space narrowing. Mild reactive endplate changes. No stenosis. T12-L1: Unremarkable. L1-2: Tiny central disc protrusion indents the ventral thecal sac. Mild facet hypertrophy. No stenosis. L2-3: Trace retrolisthesis. Minimal annular disc bulge with disc desiccation. Mild facet hypertrophy. No significant spinal stenosis. Mild left L2 foraminal narrowing. L3-4: Disc desiccation with minimal bulge. Mild facet hypertrophy. No stenosis. L4-5: Negative interspace. Moderate facet hypertrophy. No stenosis. L5-S1: Chronic bilateral pars defects with 8 mm spondylolisthesis. Associated broad posterior pseudo disc bulge/uncovering. Central  canal remains widely patent. Moderate to advanced bilateral L5 foraminal stenosis, potentially affecting either of the L5 nerve roots.  IMPRESSION: 1. Chronic bilateral pars defects at L5 with associated 8 mm spondylolisthesis and resultant moderate bilateral L5 foraminal stenosis. Either of the L5 nerve roots could be affected. 2. Minor disc bulge with facet hypertrophy at L2-3 with resultant mild left L2 foraminal narrowing. 3. Additional minor degenerative changes elsewhere within the lumbar spine without significant stenosis or impingement.  Electronically Signed   By:  Morene Hoard M.D.   On: 03/22/2018 21:51     Plan of Care  Problem-specific:  Assessment and Plan    Chronic bilateral hip pain   Chronic bilateral hip pain has significantly improved following bilateral interarticular hip joint and trochanteric bursa injections on 02-13-24. She reports 100% relief immediately post-procedure with ongoing 75% improvement. She can now cross her legs and walk up steps without pain.  Chronic low back pain with buttock radiation   Chronic low back pain with radiation to the buttocks persists, localized to the lower back and buttocks without leg radiation. Previous lumbar facet radiofrequency was performed, with the last known procedure in 2020, though she recalls a procedure around this time last year. She is scheduled to meet with Dr. Clois for further surgical evaluation.       Ms. Renatha Rosen has a current medication list which includes the following long-term medication(s): azelastine  hcl, escitalopram , flonase  sensimist, hydrocodone -acetaminophen , hydrocodone -acetaminophen , hydrocodone -acetaminophen , lansoprazole , meloxicam , omega-3 acid ethyl esters, and rosuvastatin .  Pharmacotherapy (Medications Ordered): No orders of the defined types were placed in this encounter.  Orders:  Orders Placed This Encounter  Procedures   Nursing Instructions:    Please complete  this patient's postprocedure evaluation.    Scheduling Instructions:     Please complete this patient's postprocedure evaluation.     Interventional Therapies  Risk  Complexity Considerations:   WNL   Planned  Pending:      Under consideration:   Possible spinal cord stimulator trial (information about device provided on 02/27/2024)  Pending evaluation by Dr. Katrina for neurosurgical alternatives. Therapeutic right L2-3 LESI #3  Therapeutic bilateral lumbar facet MB RFA #4    Completed:   Diagnostic right L2-3 LESI x2 (01/23/2024) (100/100/60/60-100)  Therapeutic bilateral IA hip + bilateral trochanteric bursa inj. R5L4 (02/13/2024) (100/100/75/75)  Therapeutic bilateral lumbar facet MBB x3 (09/08/2020) (4-0) (100/100/60/60)  Therapeutic right lumbar facet RFA x3 (02/21/2023) (4-4) (100/100/80/80)  Therapeutic left lumbar facet RFA x3 (03/09/2023) (3-0) (100/100/90/80-90%)  Therapeutic right IA hip inj. x2 (08/20/2020) (5-1) (100/100/80/80)  Therapeutic left IA hip inj. x1 (08/20/2020) (5-1) (100/100/80/80)  Therapeutic bilateral trochanteric bursa inj. x3 (04/07/2022)  (100/100/90/90)  Neurosurgery consult for evaluation of dynamic lumbar spondylosis and possible surgical alternatives (09/20/2023)   Therapeutic  Palliative (PRN) options:   Palliative lumbar facet RFA  Palliative Hip & trochanteric bursa inj.    Pharmacotherapy  Nonopioids transferred 05/11/2020: Baclofen  and Mobic       Return if symptoms worsen or fail to improve.    Recent Visits Date Type Provider Dept  02/13/24 Procedure visit Tanya Glisson, MD [facility]  02/06/24 Office Visit Tanya Glisson, MD [facility]  01/23/24 Procedure visit Tanya Glisson, MD [facility]  01/17/24 Office Visit Patel, Seema K, NP [facility]  12/18/23 Office Visit Patel, Seema K, NP [facility]  Showing recent visits within past 90 days and meeting all other  requirements Today's Visits Date Type Provider Dept  02/27/24 Office Visit Tanya Glisson, MD [facility]  Showing today's visits and meeting all other requirements Future Appointments Date Type Provider Dept  03/19/24 Appointment Patel, Seema K, NP [facility]  Showing future appointments within next 90 days and meeting all other requirements  I discussed the assessment and treatment plan with the patient. The patient was provided an opportunity to ask questions and all were answered. The patient agreed with the plan and demonstrated an understanding of the instructions.  Patient advised  to call back or seek an in-person evaluation if the symptoms or condition worsens.  Duration of encounter: 30 minutes.  Total time on encounter, as per AMA guidelines included both the face-to-face and non-face-to-face time personally spent by the physician and/or other qualified health care professional(s) on the day of the encounter (includes time in activities that require the physician or other qualified health care professional and does not include time in activities normally performed by clinical staff). Physician's time may include the following activities when performed: Preparing to see the patient (e.g., pre-charting review of records, searching for previously ordered imaging, lab work, and nerve conduction tests) Review of prior analgesic pharmacotherapies. Reviewing PMP Interpreting ordered tests (e.g., lab work, imaging, nerve conduction tests) Performing post-procedure evaluations, including interpretation of diagnostic procedures Obtaining and/or reviewing separately obtained history Performing a medically appropriate examination and/or evaluation Counseling and educating the patient/family/caregiver Ordering medications, tests, or procedures Referring and communicating with other health care professionals (when not separately reported) Documenting clinical  information in the electronic or other health record Independently interpreting results (not separately reported) and communicating results to the patient/ family/caregiver Care coordination (not separately reported)  Note by: Eric DELENA Como, MD (TTS and AI technology used. I apologize for any typographical errors that were not detected and corrected.) Date: 02/27/2024; Time: 4:31 PM

## 2024-02-28 DIAGNOSIS — M4316 Spondylolisthesis, lumbar region: Secondary | ICD-10-CM

## 2024-02-28 DIAGNOSIS — M51362 Other intervertebral disc degeneration, lumbar region with discogenic back pain and lower extremity pain: Secondary | ICD-10-CM

## 2024-02-28 NOTE — Addendum Note (Signed)
 Addended by: HILMA HASTINGS on: 02/28/2024 03:02 PM   Modules accepted: Orders

## 2024-03-05 ENCOUNTER — Ambulatory Visit: Admitting: Neurosurgery

## 2024-03-05 ENCOUNTER — Encounter: Payer: Self-pay | Admitting: Neurosurgery

## 2024-03-05 VITALS — BP 128/82 | Ht 64.0 in | Wt 175.0 lb

## 2024-03-05 DIAGNOSIS — G8929 Other chronic pain: Secondary | ICD-10-CM

## 2024-03-05 DIAGNOSIS — M532X6 Spinal instabilities, lumbar region: Secondary | ICD-10-CM | POA: Diagnosis not present

## 2024-03-05 DIAGNOSIS — M5441 Lumbago with sciatica, right side: Secondary | ICD-10-CM

## 2024-03-05 DIAGNOSIS — M4310 Spondylolisthesis, site unspecified: Secondary | ICD-10-CM

## 2024-03-05 DIAGNOSIS — M4316 Spondylolisthesis, lumbar region: Secondary | ICD-10-CM | POA: Diagnosis not present

## 2024-03-19 ENCOUNTER — Ambulatory Visit: Attending: Nurse Practitioner | Admitting: Nurse Practitioner

## 2024-03-19 ENCOUNTER — Encounter: Payer: Self-pay | Admitting: Nurse Practitioner

## 2024-03-19 DIAGNOSIS — Z79891 Long term (current) use of opiate analgesic: Secondary | ICD-10-CM | POA: Insufficient documentation

## 2024-03-19 DIAGNOSIS — M25552 Pain in left hip: Secondary | ICD-10-CM | POA: Diagnosis not present

## 2024-03-19 DIAGNOSIS — M25551 Pain in right hip: Secondary | ICD-10-CM | POA: Diagnosis not present

## 2024-03-19 DIAGNOSIS — M533 Sacrococcygeal disorders, not elsewhere classified: Secondary | ICD-10-CM | POA: Insufficient documentation

## 2024-03-19 DIAGNOSIS — M545 Low back pain, unspecified: Secondary | ICD-10-CM | POA: Diagnosis not present

## 2024-03-19 DIAGNOSIS — M47816 Spondylosis without myelopathy or radiculopathy, lumbar region: Secondary | ICD-10-CM | POA: Diagnosis not present

## 2024-03-19 DIAGNOSIS — G8929 Other chronic pain: Secondary | ICD-10-CM | POA: Insufficient documentation

## 2024-03-19 DIAGNOSIS — Z79899 Other long term (current) drug therapy: Secondary | ICD-10-CM | POA: Insufficient documentation

## 2024-03-19 DIAGNOSIS — G894 Chronic pain syndrome: Secondary | ICD-10-CM | POA: Insufficient documentation

## 2024-03-19 MED ORDER — HYDROCODONE-ACETAMINOPHEN 7.5-325 MG PO TABS: 1.0000 | ORAL_TABLET | Freq: Three times a day (TID) | ORAL | 0 refills | Status: DC | PRN

## 2024-03-19 MED ORDER — HYDROCODONE-ACETAMINOPHEN 7.5-325 MG PO TABS
1.0000 | ORAL_TABLET | Freq: Three times a day (TID) | ORAL | 0 refills | Status: DC | PRN
Start: 2024-03-23 — End: 2024-04-17

## 2024-03-19 MED ORDER — NALOXONE HCL 4 MG/0.1ML NA LIQD: 1.0000 | NASAL | 0 refills | Status: AC | PRN

## 2024-03-19 NOTE — Progress Notes (Signed)
 Safety precautions to be maintained throughout the outpatient stay will include: orient to surroundings, keep bed in low position, maintain call bell within reach at all times, provide assistance with transfer out of bed and ambulation.   Nursing Pain Medication Assessment:  Safety precautions to be maintained throughout the outpatient stay will include: orient to surroundings, keep bed in low position, maintain call bell within reach at all times, provide assistance with transfer out of bed and ambulation.  Medication Inspection Compliance: Pill count conducted under aseptic conditions, in front of the patient. Neither the pills nor the bottle was removed from the patient's sight at any time. Once count was completed pills were immediately returned to the patient in their original bottle.  Medication: Hydrocodone /APAP Pill/Patch Count: 5 of 90 pills/patches remain Pill/Patch Appearance: Markings consistent with prescribed medication Bottle Appearance: Standard pharmacy container. Clearly labeled. Filled Date: 7 / 24 / 2025 Last Medication intake:  Today

## 2024-03-19 NOTE — Progress Notes (Signed)
 PROVIDER NOTE: Interpretation of information contained herein should be left to medically-trained personnel. Specific patient instructions are provided elsewhere under Patient Instructions section of medical record. This document was created in part using AI and STT-dictation technology, any transcriptional errors that may result from this process are unintentional.  Patient: Denise Burns  Service: E/M   PCP: Chandra Toribio POUR, MD  DOB: 1971/02/12  DOS: 03/19/2024  Provider: Emmy POUR Blanch, NP  MRN: [id number]  Delivery: Face-to-face  Specialty: Interventional Pain Management  Type: Established Patient  Setting: Ambulatory outpatient facility  Specialty designation: 09  Referring Prov.: Chandra Toribio POUR, MD  Location: Outpatient office facility       History of present illness (HPI) Denise Burns, a 53 y.o. year old female, is here today because of her No primary diagnosis found.. [name]'s primary complain today is Back Pain (lower)  Pertinent problems: Denise Burns has Chronic midline low back pain with bilateral sciatica; Chronic low back pain(1ry area of pain) (Bilateral) (R>L) w/o sciatica; Lumbar facet hypertrophy (multilevel) (Bilateral); Lumbar facet syndrome; Chronic musculoskeletal pain; Neurogenic pain; Greater trochanteric bursitis (Bilateral); Chronic hip pain (Right); Osteoarthritis of hip (Right); Chronic hip pain (Bilateral); and Chronic pain syndrome on their pertinent problem list.   Pain Assessment: Severity of Chronic pain is reported as a 4 /10. Location: Back Lower/radiates down both legs, hips to top of thigh. Onset: More than a month ago. Quality: Aching. Timing: Constant. Modifying factor(s): relaxing, meds, cold therapy. Vitals:  height is 5' 4 (1.626 m) and weight is 175 lb (79.4 kg). Her temperature is 98.1 F (36.7 C). Her blood pressure is 145/93 (abnormal) and her pulse is 90. Her oxygen saturation is 100%.  BMI: Estimated body mass index is 30.04 kg/m  as calculated from the following:   Height as of this encounter: 5' 4 (1.626 m).   Weight as of this encounter: 175 lb (79.4 kg).  Last encounter: 01/17/2024. Last procedure: Visit date not found.  Reason for encounter: medication management.  The patient reports doing well on the current medication regimen, with no adverse reaction or side effects reported to medication.  The patient reports low back pain and bilateral hip pain. The patient presents with the complaints of bilateral low back pain that radiates to both hips, extending down to the lateral aspect of the left leg and the anterior aspect of the thigh; however after hip injection it feels better to help in functional improvement along with pain relief.   Pharmacotherapy Assessment    Hydrocodone -acetaminophen  (Norco) 7.5-325 mg tablet every 8 hours as needed for severe pain. MME=22.50  Monitoring: [place] PMP: PDMP reviewed during this encounter.       Pharmacotherapy: No side-effects or adverse reactions reported. Compliance: No problems identified. Effectiveness: Clinically acceptable.  Denise Burns PARAS, RN  03/19/2024  9:27 AM  Sign when Signing Visit Safety precautions to be maintained throughout the outpatient stay will include: orient to surroundings, keep bed in low position, maintain call bell within reach at all times, provide assistance with transfer out of bed and ambulation.   Nursing Pain Medication Assessment:  Safety precautions to be maintained throughout the outpatient stay will include: orient to surroundings, keep bed in low position, maintain call bell within reach at all times, provide assistance with transfer out of bed and ambulation.  Medication Inspection Compliance: Pill count conducted under aseptic conditions, in front of the patient. Neither the pills nor the bottle was removed from the patient's sight at any  time. Once count was completed pills were immediately returned to the patient in their original  bottle.  Medication: Hydrocodone /APAP Pill/Patch Count: 5 of 90 pills/patches remain Pill/Patch Appearance: Markings consistent with prescribed medication Bottle Appearance: Standard pharmacy container. Clearly labeled. Filled Date: 7 / 24 / 2025 Last Medication intake:  Today    UDS:  Summary  Date Value Ref Range Status  03/22/2023 Note  Final    Comment:    ==================================================================== [employer] Select 13 (MW) ==================================================================== Test                             Result       Flag       Units  Drug Present and Declared for Prescription Verification   Hydrocodone                     1894         EXPECTED   ng/mg creat   Hydromorphone                   329          EXPECTED   ng/mg creat   Dihydrocodeine                 292          EXPECTED   ng/mg creat   Norhydrocodone                 1758         EXPECTED   ng/mg creat    Sources of hydrocodone  include scheduled prescription medications.    Hydromorphone , dihydrocodeine and norhydrocodone are expected    metabolites of hydrocodone . Hydromorphone  and dihydrocodeine are    also available as scheduled prescription medications.    Oxymorphone                    335          EXPECTED   ng/mg creat   Noroxymorphone                 134          EXPECTED   ng/mg creat    Sources of oxymorphone are scheduled prescription medications;    oxymorphone is also a metabolite of oxycodone . Noroxymorphone is an    expected metabolite of oxymorphone.  Drug Absent but Declared for Prescription Verification   Oxycodone                       Not Detected UNEXPECTED ng/mg creat    Oxycodone  is almost always present in patients taking this drug    consistently.  Absence of oxycodone  could be due to lapse of time    since the last dose or unusual pharmacokinetics (rapid metabolism).  ==================================================================== Test                       Result    Flag   Units      Ref Range   Creatinine              65               mg/dL      >=79 ==================================================================== Declared Medications:  The flagging and interpretation on this report are based on the  following declared medications.  Unexpected results may arise from  inaccuracies in the declared medications.   **  Note: The testing scope of this panel includes these medications:   Hydrocodone  (Norco)  Oxycodone  (Percocet)   **Note: The testing scope of this panel does not include the  following reported medications:   Acetaminophen  (Norco)  Acetaminophen  (Percocet)  Azelastine   Cetirizine (Zyrtec)  Escitalopram  (Lexapro )  Fludrocortisone  (Florinef )  Fluticasone  (Flonase )  Hydrocortisone  (Cortef )  Hyoscyamine  Lansoprazole  (Prevacid )  Meloxicam  (Mobic )  Methenamine   Methylene Blue  Naloxone  (Narcan )  Omega-3 Fatty Acids  Pseudoephedrine  (Sudafed)  Supplement  Vitamin D2 ==================================================================== For clinical consultation, please call [phone]. ====================================================================     No results found for: CBDTHCR No results found for: D8THCCBX No results found for: D9THCCBX  ROS  Constitutional: Denies any fever or chills Gastrointestinal: No reported hemesis, hematochezia, vomiting, or acute GI distress Musculoskeletal: Denies any acute onset joint swelling, redness, loss of ROM, or weakness Neurological: No reported episodes of acute onset apraxia, aphasia, dysarthria, agnosia, amnesia, paralysis, loss of coordination, or loss of consciousness  Medication Review  Azelastine  HCl, HYDROcodone -acetaminophen , Urogesic-Blue, cetirizine, escitalopram , fludrocortisone , fluticasone , hydrocortisone , lansoprazole , meloxicam , naloxone , omega-3 acid ethyl esters, pseudoephedrine , rosuvastatin , and tiZANidine   History  Review  Allergy: Ms. Takeda is allergic to tramadol, lisinopril, and sulfa antibiotics. Drug: Ms. Wiersma  reports no history of drug use. Alcohol:  reports that she does not currently use alcohol. Tobacco:  reports that she quit smoking about 29 years ago. Her smoking use included cigarettes. She has never used smokeless tobacco. Social: Ms. Bloch  reports that she quit smoking about 29 years ago. Her smoking use included cigarettes. She has never used smokeless tobacco. She reports that she does not currently use alcohol. She reports that she does not use drugs. Medical:  has a past medical history of Addison disease (HCC), Arthritis, Back pain, Complication of anesthesia, GERD (gastroesophageal reflux disease), and History of kidney stones. Surgical: Ms. Stobaugh  has a past surgical history that includes Cholecystectomy (2010); Polypectomy; dental work; Dilation and curettage of uterus (2010); IR NEPHROSTOMY PLACEMENT LEFT (12/14/2020); Nephrolithotomy (Left, 12/14/2020); Wisdom tooth extraction; Diagnostic laparoscopy (2004); Cystoscopy/ureteroscopy/holmium laser/stent placement (Right, 06/28/2021); Cystoscopy w/ ureteral stent removal (Right, 08/09/2021); Cystoscopy w/ retrogrades (Right, 08/09/2021); Cystoscopy/ureteroscopy/holmium laser/stent placement (Right, 08/09/2021); and Colonoscopy with propofol  (N/A, 06/08/2022). Family: family history includes Alcohol abuse in her father; Arthritis in her mother; Brain cancer in her mother; Colon polyps (age of onset: 60) in her maternal grandmother; Hearing loss in her father; High blood pressure in her father and mother; Hyperlipidemia in her father and mother; Hypertension in her mother; Liver cancer in her mother; Lung cancer in her mother.  Laboratory Chemistry Profile   Renal Lab Results  Component Value Date   BUN 18 09/12/2023   CREATININE 0.91 09/12/2023   BCR 20 09/12/2023   GFR 84.36 08/26/2021   GFRAA 86 08/16/2018   GFRNONAA >60  09/18/2022    Hepatic Lab Results  Component Value Date   AST 25 09/12/2023   ALT 31 09/12/2023   ALBUMIN 4.4 09/12/2023   ALKPHOS 68 09/12/2023   LIPASE 38 09/16/2022    Electrolytes Lab Results  Component Value Date   NA 142 09/12/2023   K 3.9 09/12/2023   CL 104 09/12/2023   CALCIUM  9.5 09/12/2023   MG 2.2 09/18/2022   PHOS 4.3 09/18/2022    Bone Lab Results  Component Value Date   VD25OH 14.0 (L) 06/15/2022   25OHVITD1 67 08/16/2018   25OHVITD2 <1.0 08/16/2018   25OHVITD3 67 08/16/2018   TESTOSTERONE  1 (L) 08/13/2020    Inflammation (  CRP: Acute Phase) (ESR: Chronic Phase) Lab Results  Component Value Date   CRP 2 08/16/2018   ESRSEDRATE 37 (H) 08/16/2018   LATICACIDVEN 1.9 09/16/2022         Note: Above Lab results reviewed.  Recent Imaging Review  DG PAIN CLINIC C-ARM 1-60 MIN NO REPORT Fluoro was used, but no Radiologist interpretation will be provided.  Please refer to NOTES tab for provider progress note. Note: Reviewed        Physical Exam  Vitals: BP (!) 145/93   Pulse 90   Temp 98.1 F (36.7 C)   Ht 5' 4 (1.626 m)   Wt 175 lb (79.4 kg)   LMP 12/01/2020 (Approximate)   SpO2 100%   BMI 30.04 kg/m  BMI: Estimated body mass index is 30.04 kg/m as calculated from the following:   Height as of this encounter: 5' 4 (1.626 m).   Weight as of this encounter: 175 lb (79.4 kg). Ideal: Ideal body weight: 54.7 kg (120 lb 9.5 oz) Adjusted ideal body weight: 64.6 kg (142 lb 5.7 oz) General appearance: Well nourished, well developed, and well hydrated. In no apparent acute distress Mental status: Alert, oriented x 3 (person, place, & time)       Respiratory: No evidence of acute respiratory distress Eyes: PERLA   Assessment   Diagnosis Status  1. Chronic bilateral low back pain without sciatica   2. Chronic right sacroiliac joint pain   3. Lumbar facet joint syndrome   4. Chronic hip pain, bilateral   5. Chronic pain syndrome   6.  Pharmacologic therapy   7. Chronic use of opiate for therapeutic purpose   8. Encounter for medication management   9. Encounter for chronic pain management    Controlled Controlled Controlled   Updated Problems: No problems updated.  Plan of Care  Problem-specific:  Assessment and Plan   Will continue on current medication regimen.  Prescribing drug monitoring (PDMP) reviewed; findings consistent with prescribed medication use and no evidence of narcotic misuse or abuse.  Routine UDS ordered today. Schedule follow-up visit in 90 days with Damira Kem, NP.  No other new issues or problems reported at this visit.  Ms. Blyss Lugar has a current medication list which includes the following long-term medication(s): azelastine  hcl, escitalopram , flonase  sensimist, lansoprazole , meloxicam , omega-3 acid ethyl esters, rosuvastatin , [START ON 03/23/2024] hydrocodone -acetaminophen , [START ON 04/22/2024] hydrocodone -acetaminophen , and [START ON 05/22/2024] hydrocodone -acetaminophen .  Pharmacotherapy (Medications Ordered): Meds ordered this encounter  Medications   HYDROcodone -acetaminophen  (NORCO) 7.5-325 MG tablet    Sig: Take 1 tablet by mouth every 8 (eight) hours as needed for severe pain (pain score 7-10). Must last 30 days.    Dispense:  90 tablet    Refill:  0    DO NOT: delete (not duplicate); no partial-fill (will deny script to complete), no refill request (F/U required). DISPENSE: 1 day early if closed on fill date. WARN: No CNS-depressants within 8 hrs of med.   HYDROcodone -acetaminophen  (NORCO) 7.5-325 MG tablet    Sig: Take 1 tablet by mouth every 8 (eight) hours as needed for severe pain (pain score 7-10). Must last 30 days.    Dispense:  90 tablet    Refill:  0    DO NOT: delete (not duplicate); no partial-fill (will deny script to complete), no refill request (F/U required). DISPENSE: 1 day early if closed on fill date. WARN: No CNS-depressants within 8 hrs of med.    HYDROcodone -acetaminophen  (NORCO) 7.5-325 MG tablet  Sig: Take 1 tablet by mouth every 8 (eight) hours as needed for severe pain (pain score 7-10). Must last 30 days.    Dispense:  90 tablet    Refill:  0    DO NOT: delete (not duplicate); no partial-fill (will deny script to complete), no refill request (F/U required). DISPENSE: 1 day early if closed on fill date. WARN: No CNS-depressants within 8 hrs of med.   Orders:  Orders Placed This Encounter  Procedures   [employer] Select 13 (MW), Urine    Volume: 30 ml(s). Minimum 3 ml of urine is needed. Document temperature of fresh sample. Indications: Long term (current) use of opiate analgesic (S20.108)    Release to patient:   Immediate        Return in about 3 months (around 06/19/2024) for (F2F), (MM), Emmy Blanch NP.    Recent Visits Date Type Provider Dept  02/27/24 Office Visit Tanya Glisson, MD [facility]  02/13/24 Procedure visit Tanya Glisson, MD [facility]  02/06/24 Office Visit Tanya Glisson, MD [facility]  01/23/24 Procedure visit Tanya Glisson, MD [facility]  01/17/24 Office Visit [name] K, NP [facility]  Showing recent visits within past 90 days and meeting all other requirements Today's Visits Date Type Provider Dept  03/19/24 Office Visit Alante Tolan K, NP [facility]  Showing today's visits and meeting all other requirements Future Appointments Date Type Provider Dept  03/20/24 Appointment Tanya Glisson, MD [facility]  06/10/24 Appointment Violanda Bobeck K, NP [facility]  Showing future appointments within next 90 days and meeting all other requirements  I discussed the assessment and treatment plan with the patient. The patient was provided an opportunity to ask questions and all were answered. The patient agreed with the plan and demonstrated an understanding of the instructions.  Patient  advised to call back or seek an in-person evaluation if the symptoms or condition worsens.  Duration of encounter: 30 minutes.  Total time on encounter, as per AMA guidelines included both the face-to-face and non-face-to-face time personally spent by the physician and/or other qualified health care professional(s) on the day of the encounter (includes time in activities that require the physician or other qualified health care professional and does not include time in activities normally performed by clinical staff). Physician's time may include the following activities when performed: Preparing to see the patient (e.g., pre-charting review of records, searching for previously ordered imaging, lab work, and nerve conduction tests) Review of prior analgesic pharmacotherapies. Reviewing PMP Interpreting ordered tests (e.g., lab work, imaging, nerve conduction tests) Performing post-procedure evaluations, including interpretation of diagnostic procedures Obtaining and/or reviewing separately obtained history Performing a medically appropriate examination and/or evaluation Counseling and educating the patient/family/caregiver Ordering medications, tests, or procedures Referring and communicating with other health care professionals (when not separately reported) Documenting clinical information in the electronic or other health record Independently interpreting results (not separately reported) and communicating results to the patient/ family/caregiver Care coordination (not separately reported)  Note by: Jeriann Sayres K Kaelan Amble, NP (TTS and AI technology used. I apologize for any typographical errors that were not detected and corrected.) Date: 03/19/2024; Time: 10:10 AM

## 2024-03-20 ENCOUNTER — Encounter: Payer: Self-pay | Admitting: Pain Medicine

## 2024-03-20 ENCOUNTER — Ambulatory Visit: Attending: Pain Medicine | Admitting: Pain Medicine

## 2024-03-20 ENCOUNTER — Ambulatory Visit: Admitting: Pain Medicine

## 2024-03-20 VITALS — BP 151/88 | HR 81 | Temp 98.0°F | Resp 16 | Ht 64.0 in | Wt 175.0 lb

## 2024-03-20 DIAGNOSIS — M4820 Kissing spine, site unspecified: Secondary | ICD-10-CM | POA: Insufficient documentation

## 2024-03-20 DIAGNOSIS — M5459 Other low back pain: Secondary | ICD-10-CM | POA: Diagnosis not present

## 2024-03-20 DIAGNOSIS — M545 Low back pain, unspecified: Secondary | ICD-10-CM | POA: Insufficient documentation

## 2024-03-20 DIAGNOSIS — M4306 Spondylolysis, lumbar region: Secondary | ICD-10-CM | POA: Insufficient documentation

## 2024-03-20 DIAGNOSIS — M47816 Spondylosis without myelopathy or radiculopathy, lumbar region: Secondary | ICD-10-CM | POA: Insufficient documentation

## 2024-03-20 DIAGNOSIS — G8929 Other chronic pain: Secondary | ICD-10-CM | POA: Insufficient documentation

## 2024-03-20 DIAGNOSIS — M4317 Spondylolisthesis, lumbosacral region: Secondary | ICD-10-CM | POA: Diagnosis not present

## 2024-03-20 DIAGNOSIS — M4310 Spondylolisthesis, site unspecified: Secondary | ICD-10-CM | POA: Diagnosis not present

## 2024-03-20 NOTE — Progress Notes (Signed)
 PROVIDER NOTE: Interpretation of information contained herein should be left to medically-trained personnel. Specific patient instructions are provided elsewhere under Patient Instructions section of medical record. This document was created in part using AI and STT-dictation technology, any transcriptional errors that may result from this process are unintentional.  Patient: Denise Burns  Service: E/M   PCP: Chandra Toribio POUR, MD  DOB: Oct 04, 1970  DOS: 03/20/2024  Provider: Eric DELENA Como, MD  MRN: [id number]  Delivery: Face-to-face  Specialty: Interventional Pain Management  Type: Established Patient  Setting: Ambulatory outpatient facility  Specialty designation: 09  Referring Prov.: Chandra Toribio POUR, MD  Location: Outpatient office facility       History of present illness (HPI) [name], a 53 y.o. year old female, is here today because of her Chronic bilateral low back pain without sciatica [M54.50, G89.29]. [name]'s primary complain today is Back Pain  Pertinent problems: [name] has Chronic midline low back pain with bilateral sciatica; Chronic low back pain (1ry area of Pain) (Bilateral) (R>L) w/o sciatica; Chronic sacroiliac joint pain (2ry area of Pain) (Right); Chronic pain syndrome; Abnormal MRI, lumbar spine (11/05/2023); Lumbar facet hypertrophy (Multilevel) (Bilateral); Lumbar facet syndrome (Bilateral); Spondylolisthesis (8 mm) at L5-S1 level; Lumbar Grade 1 Retrolisthesis (2 mm) of L2/L3; L5-S1 pars defect w/ spondylolisthesis (Bilateral); Lumbar pars defect (L5-S1) (Bilateral); Lumbar foraminal stenosis (Left: L2-3) (Bilateral: L5-S1); Osteoarthritis of sacroiliac joint (Bilateral); Chronic musculoskeletal pain; Neurogenic pain; Osteoarthritis of facet joint of lumbar spine; Greater trochanteric bursitis (Bilateral); Lumbar spondylosis; Spondylosis without myelopathy or radiculopathy, lumbosacral region; Chronic hip pain (Right); Osteoarthritis of hip  (Right); Lumbar radiculitis (L1/L2) (Right); Chronic groin pain (Right); Chronic hip pain (Bilateral); Enthesopathy hip region (Left); Osteoarthritis of hips (Bilateral); Kissing spine syndrome; Chronic low back pain (Midline) w/o sciatica; Trigger point with back pain (PSIS area) (Right); Lumbar facet joint pain; Broken toes (Right); Closed displaced fracture of distal phalanx of lesser toe of right foot; Chronic lower extremity pain (Right); Lumbosacral radiculopathy at L2 (Right); Lumbosacral radiculopathy at L3 (Right); Weakness of lower extremity; DDD (degenerative disc disease), lumbosacral w/ LBP and LEP; Pain of both elbows; Lumbosacral Anterolisthesis of L5/S1 (Dynamic) (N/E:64mm  F:76mm); Multifactorial low back pain; and Low back pain of over 3 months duration on their pertinent problem list.  Pain Assessment: Severity of Chronic pain is reported as a 4 /10. Location: Back Lower/to buttom of buttocks bilat. Onset: More than a month ago. Quality: Aching. Timing: Constant. Modifying factor(s): relaxing, meds, cold therapy. Vitals:  height is 5' 4 (1.626 m) and weight is 175 lb (79.4 kg). Her temperature is 98 F (36.7 C). Her blood pressure is 151/88 (abnormal) and her pulse is 81. Her respiration is 16 and oxygen saturation is 98%.  BMI: Estimated body mass index is 30.04 kg/m as calculated from the following:   Height as of this encounter: 5' 4 (1.626 m).   Weight as of this encounter: 175 lb (79.4 kg).  Last encounter: 02/27/2024. Last procedure: 02/13/2024.  Reason for encounter: evaluation of worsening, or previously known (established) problem.   Discussed the use of AI scribe software for clinical note transcription with the patient, who gave verbal consent to proceed.  History of Present Illness   Denise Burns is a 53 year old female who presents with chronic back pain.  She experiences back pain radiating to the buttock area, specifically at the top of the thigh and  bottom of the buttock, without numbness, pain, or weakness in the  legs. She underwent radiofrequency treatment in July and August of last year, which provided relief for about a year. The first RF treatment lasted 18 months, but the subsequent one lasted only a year.       Pharmacotherapy Assessment   Opioid Analgesic(s): Hydrocodone /APAP 7.5/325 1 tablet p.o. 3 times daily (22.5 mg/day of hydrocodone )  MME/day: 22.5 mg/day.   Monitoring: [place] PMP: PDMP reviewed during this encounter.       Pharmacotherapy: No side-effects or adverse reactions reported. Compliance: No problems identified. Effectiveness: Clinically acceptable.  Dayna Pulling, RN  03/20/2024  3:10 PM  Sign when Signing Visit Safety precautions to be maintained throughout the outpatient stay will include: orient to surroundings, keep bed in low position, maintain call bell within reach at all times, provide assistance with transfer out of bed and ambulation.     UDS:  Summary  Date Value Ref Range Status  03/19/2024 FINAL  Final    Comment:    ==================================================================== [employer] [facility] (MW) ==================================================================== Test                             Result       Flag       Units  Drug Present and Declared for Prescription Verification   Hydrocodone                     389          EXPECTED   ng/mg creat   Hydromorphone                   111          EXPECTED   ng/mg creat   Dihydrocodeine                 61           EXPECTED   ng/mg creat   Norhydrocodone                 512          EXPECTED   ng/mg creat    Sources of hydrocodone  include scheduled prescription medications.    Hydromorphone , dihydrocodeine and norhydrocodone are expected    metabolites of hydrocodone . Hydromorphone  and dihydrocodeine are    also available as scheduled prescription  medications.  ==================================================================== Test                      Result    Flag   Units      Ref Range   Creatinine              99               mg/dL      >=79 ==================================================================== Declared Medications:  The flagging and interpretation on this report are based on the  following declared medications.  Unexpected results may arise from  inaccuracies in the declared medications.   **Note: The testing scope of this panel includes these medications:   Hydrocodone    **Note: The testing scope of this panel does not include the  following reported medications:   Acetaminophen   Azelastine   Cetirizine (Zyrtec)  Escitalopram  (Lexapro )  Fludrocortisone  (Florinef )  Fluticasone  (Flonase )  Hydrocortisone  (Cortef )  Hyoscyamine  Lansoprazole  (Prevacid )  Meloxicam  (Mobic )  Methenamine   Methylene Blue  Naloxone  (Narcan )  Omega-3-Acid Ethyl Esters (Lovaza)  Pseudoephedrine  (Sudafed)  Rosuvastatin  (Crestor )  Sodium Phosphate   Tizanidine  ==================================================================== For clinical  consultation, please call [phone]. ====================================================================     No results found for: CBDTHCR No results found for: D8THCCBX No results found for: D9THCCBX  ROS  Constitutional: Denies any fever or chills Gastrointestinal: No reported hemesis, hematochezia, vomiting, or acute GI distress Musculoskeletal: Denies any acute onset joint swelling, redness, loss of ROM, or weakness Neurological: No reported episodes of acute onset apraxia, aphasia, dysarthria, agnosia, amnesia, paralysis, loss of coordination, or loss of consciousness  Medication Review  Azelastine  HCl, HYDROcodone -acetaminophen , Urogesic-Blue, cetirizine, escitalopram , fludrocortisone , fluticasone , hydrocortisone , lansoprazole , meloxicam , naloxone , omega-3 acid  ethyl esters, pseudoephedrine , rosuvastatin , and tiZANidine   History Review  Allergy: [name] is allergic to tramadol, lisinopril, and sulfa antibiotics. Drug: [name]  reports no history of drug use. Alcohol:  reports that she does not currently use alcohol. Tobacco:  reports that she quit smoking about 29 years ago. Her smoking use included cigarettes. She has never used smokeless tobacco. Social: Ms. Hoe  reports that she quit smoking about 29 years ago. Her smoking use included cigarettes. She has never used smokeless tobacco. She reports that she does not currently use alcohol. She reports that she does not use drugs. Medical:  has a past medical history of Addison disease (HCC), Arthritis, Back pain, Complication of anesthesia, GERD (gastroesophageal reflux disease), and History of kidney stones. Surgical: Ms. Polich  has a past surgical history that includes Cholecystectomy (2010); Polypectomy; dental work; Dilation and curettage of uterus (2010); IR NEPHROSTOMY PLACEMENT LEFT (12/14/2020); Nephrolithotomy (Left, 12/14/2020); Wisdom tooth extraction; Diagnostic laparoscopy (2004); Cystoscopy/ureteroscopy/holmium laser/stent placement (Right, 06/28/2021); Cystoscopy w/ ureteral stent removal (Right, 08/09/2021); Cystoscopy w/ retrogrades (Right, 08/09/2021); Cystoscopy/ureteroscopy/holmium laser/stent placement (Right, 08/09/2021); and Colonoscopy with propofol  (N/A, 06/08/2022). Family: family history includes Alcohol abuse in her father; Arthritis in her mother; Brain cancer in her mother; Colon polyps (age of onset: 43) in her maternal grandmother; Hearing loss in her father; High blood pressure in her father and mother; Hyperlipidemia in her father and mother; Hypertension in her mother; Liver cancer in her mother; Lung cancer in her mother.  Laboratory Chemistry Profile   Renal Lab Results  Component Value Date   BUN 18 09/12/2023   CREATININE 0.91 09/12/2023   BCR 20  09/12/2023   GFR 84.36 08/26/2021   GFRAA 86 08/16/2018   GFRNONAA >60 09/18/2022    Hepatic Lab Results  Component Value Date   AST 25 09/12/2023   ALT 31 09/12/2023   ALBUMIN 4.4 09/12/2023   ALKPHOS 68 09/12/2023   LIPASE 38 09/16/2022    Electrolytes Lab Results  Component Value Date   NA 142 09/12/2023   K 3.9 09/12/2023   CL 104 09/12/2023   CALCIUM  9.5 09/12/2023   MG 2.2 09/18/2022   PHOS 4.3 09/18/2022    Bone Lab Results  Component Value Date   VD25OH 14.0 (L) 06/15/2022   25OHVITD1 67 08/16/2018   25OHVITD2 <1.0 08/16/2018   25OHVITD3 67 08/16/2018   TESTOSTERONE  1 (L) 08/13/2020    Inflammation (CRP: Acute Phase) (ESR: Chronic Phase) Lab Results  Component Value Date   CRP 2 08/16/2018   ESRSEDRATE 37 (H) 08/16/2018   LATICACIDVEN 1.9 09/16/2022         Note: Above Lab results reviewed.  Recent Imaging Review  DG PAIN CLIN[facility] C-ARM 1-60 MIN NO REPORT Fluoro was used, but no Radiologist interpretation will be provided.  Please refer to NOTES tab for provider progress note. Note: Reviewed        Physical Exam  Vitals: BP ROLLEN)  151/88   Pulse 81   Temp 98 F (36.7 C)   Resp 16   Ht 5' 4 (1.626 m)   Wt 175 lb (79.4 kg)   LMP 12/01/2020 (Approximate)   SpO2 98%   BMI 30.04 kg/m  BMI: Estimated body mass index is 30.04 kg/m as calculated from the following:   Height as of this encounter: 5' 4 (1.626 m).   Weight as of this encounter: 175 lb (79.4 kg). Ideal: Ideal body weight: 54.7 kg (120 lb 9.5 oz) Adjusted ideal body weight: 64.6 kg (142 lb 5.7 oz) General appearance: Well nourished, well developed, and well hydrated. In no apparent acute distress Mental status: Alert, oriented x 3 (person, place, & time)       Respiratory: No evidence of acute respiratory distress Eyes: PERLA   Assessment   Diagnosis Status  1. Chronic low back pain (1ry area of Pain) (Bilateral) (R>L) w/o sciatica   2. L5-S1 pars defect w/ spondylolisthesis  (Bilateral)   3. Kissing spine syndrome   4. Low back pain of over 3 months duration   5. Lumbar facet joint pain   6. Lumbar facet hypertrophy (Multilevel) (Bilateral)   7. Lumbar Grade 1 Retrolisthesis (2 mm) of L2/L3   8. Lumbar pars defect (L5-S1) (Bilateral)   9. Lumbosacral Anterolisthesis of L5/S1 (Dynamic) (N/E:66mm  F:85mm)    Controlled Controlled Controlled   Updated Problems: No problems updated.  Plan of Care  Problem-specific:  Assessment and Plan    Chronic low back pain with lumbar radiculopathy   Chronic low back pain with lumbar radiculopathy affects her lower back, radiating to the buttock and upper thigh without numbness, pain, or weakness in the legs. Previous radiofrequency ablations provided relief for one year and eighteen months, respectively. The condition is severe enough for surgery, but it is postponed due to personal circumstances. Physical therapy will begin soon. A repeat radiofrequency ablation is planned to provide temporary relief until surgery can be performed.       [name] has a current medication list which includes the following long-term medication(s): azelastine  hcl, escitalopram , flonase  sensimist, hydrocodone -acetaminophen , [START ON 04/22/2024] hydrocodone -acetaminophen , [START ON 05/22/2024] hydrocodone -acetaminophen , lansoprazole , meloxicam , omega-3 acid ethyl esters, and rosuvastatin .  Pharmacotherapy (Medications Ordered): No orders of the defined types were placed in this encounter.  Orders:  Orders Placed This Encounter  Procedures   Radiofrequency,Lumbar    Standing Status:   Future    Expiration Date:   06/20/2024    Scheduling Instructions:     Side(s): Right-sided     Level: L3-4, L4-5, and L5-S1 Facets (L2, L3, L4, L5, and S1 Medial Branch)     Sedation: With Sedation.     Timeframe: Pending approval.    Where will this procedure be performed?:   [facility]     Interventional Therapies  Risk   Complexity Considerations:   WNL   Planned  Pending:   Therapeutic bilateral lumbar facet MB RFA #4 (Starting on right side)   Under consideration:   Possible spinal cord stimulator trial (information about device provided on 02/27/2024)  Pending evaluation by Dr. Katrina for neurosurgical alternatives. Therapeutic right L2-3 LESI #3  Therapeutic bilateral lumbar facet MB RFA #4    Completed:   Diagnostic right L2-3 LESI x2 (01/23/2024) (100/100/60/60-100)  Therapeutic bilateral IA hip + bilateral trochanteric bursa inj. R5L4 (02/13/2024) (100/100/75/75)  Therapeutic bilateral lumbar facet MBB x3 (09/08/2020) (4-0) (100/100/60/60)  Therapeutic right lumbar facet RFA x3 (02/21/2023) (4-4) (  100/100/80/80)  Therapeutic left lumbar facet RFA x3 (03/09/2023) (3-0) (100/100/90/80-90%)  Therapeutic right IA hip inj. x2 (08/20/2020) (5-1) (100/100/80/80)  Therapeutic left IA hip inj. x1 (08/20/2020) (5-1) (100/100/80/80)  Therapeutic bilateral trochanteric bursa inj. x3 (04/07/2022)  (100/100/90/90)  Neurosurgery consult for evaluation of dynamic lumbar spondylosis and possible surgical alternatives (09/20/2023)   Therapeutic  Palliative (PRN) options:   Palliative lumbar facet RFA  Palliative Hip & trochanteric bursa inj.    Pharmacotherapy  Nonopioids transferred 05/11/2020: Baclofen  and Mobic        Return for ( ), (ECT): (R) L-FCT RFA #4.    Recent Visits Date Type Provider Dept  03/20/24 Office Visit Tanya Glisson, MD [facility]  03/19/24 Office Visit Patel, Seema K, NP [facility]  02/27/24 Office Visit Tanya Glisson, MD [facility]  02/13/24 Procedure visit Tanya Glisson, MD [facility]  02/06/24 Office Visit Tanya Glisson, MD [facility]  01/23/24 Procedure visit Tanya Glisson, MD [facility]  01/17/24 Office Visit Patel, Seema K, NP [facility]  Showing recent visits within  past 90 days and meeting all other requirements Future Appointments Date Type Provider Dept  06/10/24 Appointment Patel, Seema K, NP [facility]  Showing future appointments within next 90 days and meeting all other requirements  I discussed the assessment and treatment plan with the patient. The patient was provided an opportunity to ask questions and all were answered. The patient agreed with the plan and demonstrated an understanding of the instructions.  Patient advised to call back or seek an in-person evaluation if the symptoms or condition worsens.  Duration of encounter: 30 minutes.  Total time on encounter, as per AMA guidelines included both the face-to-face and non-face-to-face time personally spent by the physician and/or other qualified health care professional(s) on the day of the encounter (includes time in activities that require the physician or other qualified health care professional and does not include time in activities normally performed by clinical staff). Physician's time may include the following activities when performed: Preparing to see the patient (e.g., pre-charting review of records, searching for previously ordered imaging, lab work, and nerve conduction tests) Review of prior analgesic pharmacotherapies. Reviewing PMP Interpreting ordered tests (e.g., lab work, imaging, nerve conduction tests) Performing post-procedure evaluations, including interpretation of diagnostic procedures Obtaining and/or reviewing separately obtained history Performing a medically appropriate examination and/or evaluation Counseling and educating the patient/family/caregiver Ordering medications, tests, or procedures Referring and communicating with other health care professionals (when not separately reported) Documenting clinical information in the electronic or other health record Independently interpreting results (not separately reported) and communicating results to  the patient/ family/caregiver Care coordination (not separately reported)  Note by: Glisson DELENA Tanya, MD (TTS and AI technology used. I apologize for any typographical errors that were not detected and corrected.) Date: 03/20/2024; Time: 7:54 AM

## 2024-03-20 NOTE — Patient Instructions (Signed)

## 2024-03-20 NOTE — Progress Notes (Signed)
 Safety precautions to be maintained throughout the outpatient stay will include: orient to surroundings, keep bed in low position, maintain call bell within reach at all times, provide assistance with transfer out of bed and ambulation.

## 2024-03-21 DIAGNOSIS — M5416 Radiculopathy, lumbar region: Secondary | ICD-10-CM | POA: Diagnosis not present

## 2024-03-21 DIAGNOSIS — M4317 Spondylolisthesis, lumbosacral region: Secondary | ICD-10-CM | POA: Diagnosis not present

## 2024-03-22 LAB — TOXASSURE SELECT 13 (MW), URINE

## 2024-03-26 DIAGNOSIS — M4317 Spondylolisthesis, lumbosacral region: Secondary | ICD-10-CM | POA: Diagnosis not present

## 2024-03-26 DIAGNOSIS — M5416 Radiculopathy, lumbar region: Secondary | ICD-10-CM | POA: Diagnosis not present

## 2024-03-28 DIAGNOSIS — M5416 Radiculopathy, lumbar region: Secondary | ICD-10-CM | POA: Diagnosis not present

## 2024-03-28 DIAGNOSIS — M4317 Spondylolisthesis, lumbosacral region: Secondary | ICD-10-CM | POA: Diagnosis not present

## 2024-04-05 DIAGNOSIS — M5416 Radiculopathy, lumbar region: Secondary | ICD-10-CM | POA: Diagnosis not present

## 2024-04-05 DIAGNOSIS — M4317 Spondylolisthesis, lumbosacral region: Secondary | ICD-10-CM | POA: Diagnosis not present

## 2024-04-09 DIAGNOSIS — M5416 Radiculopathy, lumbar region: Secondary | ICD-10-CM | POA: Diagnosis not present

## 2024-04-09 DIAGNOSIS — M4317 Spondylolisthesis, lumbosacral region: Secondary | ICD-10-CM | POA: Diagnosis not present

## 2024-04-11 DIAGNOSIS — M5416 Radiculopathy, lumbar region: Secondary | ICD-10-CM | POA: Diagnosis not present

## 2024-04-11 DIAGNOSIS — M4317 Spondylolisthesis, lumbosacral region: Secondary | ICD-10-CM | POA: Diagnosis not present

## 2024-04-17 ENCOUNTER — Ambulatory Visit
Admission: EM | Admit: 2024-04-17 | Discharge: 2024-04-17 | Disposition: A | Discharge status: Home / Self Care | Attending: Family Medicine | Admitting: Family Medicine

## 2024-04-17 ENCOUNTER — Ambulatory Visit (INDEPENDENT_AMBULATORY_CARE_PROVIDER_SITE_OTHER)

## 2024-04-17 DIAGNOSIS — R0781 Pleurodynia: Secondary | ICD-10-CM

## 2024-04-17 HISTORY — DX: Allergy, unspecified, initial encounter: T78.40XA

## 2024-04-17 HISTORY — DX: Sleep apnea, unspecified: G47.30

## 2024-04-17 MED ORDER — HYDROCODONE-ACETAMINOPHEN 7.5-325 MG PO TABS: 1.0000 | ORAL_TABLET | Freq: Four times a day (QID) | ORAL | 0 refills | Status: DC | PRN

## 2024-04-17 NOTE — ED Triage Notes (Signed)
 Patient reports, my rib pain is getting worse, I was reaching for something above/over the counter pressing upper abd/ribs into counter, now since then the pain is still their and recently worse.

## 2024-04-17 NOTE — ED Provider Notes (Signed)
 [facility]   [id number] 04/17/24 Arrival Time: 1211  ASSESSMENT & PLAN:  1. Rib pain on right side    I have personally viewed the imaging studies ordered this visit. R ribs with PA chest: no acute changes; no fx appreciated.   Meds ordered this encounter  Medications   HYDROcodone -acetaminophen  (NORCO) 7.5-325 MG tablet    Sig: Take 1 tablet by mouth every 6 (six) hours as needed for moderate pain (pain score 4-6).    Dispense:  15 tablet    Refill:  0    Follow-up Information     Chandra Toribio POUR, MD.   Specialty: Family Medicine Why: As needed. Contact information: [address] [place] [address] [phone]                 Reviewed expectations re: course of current medical issues. Questions answered. Outlined signs and symptoms indicating need for more acute intervention. Patient verbalized understanding. After Visit Summary given.   SUBJECTIVE:  History from: patient. Geneve Kimpel is a 53 y.o. female who presents with complaint of R lower/medial rib pain; reaching for something above/over the counter pressing upper abd/ribs into counter sev days ago; pain still significant. Denies SOB/trouble breathing. Mobic  does help; pain affects sleep; out of her hydrocodone  that she normally takes for pain.  Social History   Tobacco Use  Smoking Status Former   Current packs/day: 0.00   Types: Cigarettes   Quit date: 08/01/1994   Years since quitting: 29.7  Smokeless Tobacco Never   Social History   Substance and Sexual Activity  Alcohol Use Not Currently   Comment: once per 6 months    OBJECTIVE:  Vitals:   04/17/24 1234 04/17/24 1237  BP:  (!) 148/84  Pulse:  86  Resp:  18  Temp:  98.6 F (37 C)  TempSrc:  Oral  SpO2:  97%  Weight: 79.4 kg   Height: 5' 4 (1.626 m)     General appearance: alert, oriented, no acute distress but appears uncomfortable Eyes: PERRLA; EOMI; conjunctivae normal HENT: normocephalic;  atraumatic Neck: supple with FROM Lungs: without labored respirations; speaks full sentences without difficulty; CTAB Heart: regular Chest Wall: with tenderness to palpation over midline R lower ribs Abdomen: soft, non-tender; no guarding or rebound tenderness Skin: warm and dry; without rash or lesions Neuro: normal gait Psychological: alert and cooperative; normal mood and affect  Labs:  Labs Reviewed - No data to display  Imaging: DG Ribs Unilateral W/Chest Right Result Date: 04/17/2024 CLINICAL DATA:  Right rib pain after injury. EXAM: RIGHT RIBS AND CHEST - 3+ VIEW COMPARISON:  None Available. FINDINGS: No fracture or other bone lesions are seen involving the ribs. There is no evidence of pneumothorax or pleural effusion. Both lungs are clear. Heart size and mediastinal contours are within normal limits. IMPRESSION: Negative. Electronically Signed   By: Lynwood Landy Raddle M.D.   On: 04/17/2024 15:20     Allergies  Allergen Reactions   Tramadol Other (See Comments)    Other reaction(s): muscle tension   Lisinopril Rash and Cough   Sulfa Antibiotics Other (See Comments)    Blisters in mouth     Past Medical History:  Diagnosis Date   Addison disease (HCC)    Allergy    Arthritis    Back pain    Complication of anesthesia    nausea   GERD (gastroesophageal reflux disease)    History of kidney stones    Sleep apnea  Social History   Socioeconomic History   Marital status: Married    Spouse name: Ozell   Number of children: Not on file   Years of education: Not on file   Highest education level: Associate degree: academic program  Occupational History   Not on file  Tobacco Use   Smoking status: Former    Current packs/day: 0.00    Types: Cigarettes    Quit date: 08/01/1994    Years since quitting: 29.7   Smokeless tobacco: Never  Vaping Use   Vaping status: Never Used  Substance and Sexual Activity   Alcohol use: Not Currently    Comment: once per 6 months    Drug use: Never   Sexual activity: Yes    Partners: Male    Comment: menopause  Other Topics Concern   Not on file  Social History Narrative   Not on file   Social Drivers of Health   Financial Resource Strain: Low Risk  (09/06/2023)   Overall Financial Resource Strain (CARDIA)    Difficulty of Paying Living Expenses: Not hard at all  Food Insecurity: No Food Insecurity (09/06/2023)   Hunger Vital Sign    Worried About Running Out of Food in the Last Year: Never true    Ran Out of Food in the Last Year: Never true  Transportation Needs: No Transportation Needs (09/06/2023)   PRAPARE - Administrator, Civil Service (Medical): No    Lack of Transportation (Non-Medical): No  Physical Activity: Unknown (09/06/2023)   Exercise Vital Sign    Days of Exercise per Week: 0 days    Minutes of Exercise per Session: Not on file  Stress: No Stress Concern Present (09/06/2023)   [employer] of Occupational Health - Occupational Stress Questionnaire    Feeling of Stress : Only a little  Social Connections: Socially Isolated (09/06/2023)   Social Connection and Isolation Panel    Frequency of Communication with Friends and Family: Once a week    Frequency of Social Gatherings with Friends and Family: Once a week    Attends Religious Services: Never    Database administrator or Organizations: No    Attends Engineer, structural: Not on file    Marital Status: Married  Catering manager Violence: Not At Risk (09/16/2022)   Humiliation, Afraid, Rape, and Kick questionnaire    Fear of Current or Ex-Partner: No    Emotionally Abused: No    Physically Abused: No    Sexually Abused: No   Family History  Problem Relation Age of Onset   Arthritis Mother    Hyperlipidemia Mother    Hypertension Mother    High blood pressure Mother    Lung cancer Mother    Liver cancer Mother    Brain cancer Mother    Alcohol abuse Father    Hearing loss Father    Hyperlipidemia Father     High blood pressure Father    Colon polyps Maternal Grandmother 61   Colon cancer Neg Hx    Esophageal cancer Neg Hx    Stomach cancer Neg Hx    Rectal cancer Neg Hx    Past Surgical History:  Procedure Laterality Date   CHOLECYSTECTOMY     COLONOSCOPY WITH PROPOFOL  N/A 06/08/2022   Procedure: COLONOSCOPY WITH PROPOFOL ;  Surgeon: Therisa Bi, MD;  Location: [facility] ENDOSCOPY;  Service: Gastroenterology;  Laterality: N/A;   CYSTOSCOPY W/ RETROGRADES Right 08/09/2021   Procedure: CYSTOSCOPY WITH RETROGRADE PYELOGRAM;  Surgeon:  Penne Knee, MD;  Location: [facility] ORS;  Service: Urology;  Laterality: Right;   CYSTOSCOPY W/ URETERAL STENT REMOVAL Right 08/09/2021   Procedure: CYSTOSCOPY WITH STENT REMOVAL;  Surgeon: Penne Knee, MD;  Location: [facility] ORS;  Service: Urology;  Laterality: Right;   CYSTOSCOPY/URETEROSCOPY/HOLMIUM LASER/STENT PLACEMENT Right 06/28/2021   Procedure: CYSTOSCOPY/URETEROSCOPY/STENT PLACEMENT;  Surgeon: Penne Knee, MD;  Location: [facility] ORS;  Service: Urology;  Laterality: Right;   CYSTOSCOPY/URETEROSCOPY/HOLMIUM LASER/STENT PLACEMENT Right 08/09/2021   Procedure: CYSTOSCOPY/URETEROSCOPY/HOLMIUM LASER/STENT PLACEMENT;  Surgeon: Penne Knee, MD;  Location: [facility] ORS;  Service: Urology;  Laterality: Right;   dental work     DIAGNOSTIC LAPAROSCOPY  2004   endometriosis dx   DILATION AND CURETTAGE OF UTERUS  2010   IR NEPHROSTOMY PLACEMENT LEFT  12/14/2020   NEPHROLITHOTOMY Left 12/14/2020   Procedure: NEPHROLITHOTOMY PERCUTANEOUS;  Surgeon: Penne Knee, MD;  Location: [facility] ORS;  Service: Urology;  Laterality: Left;   POLYPECTOMY     WISDOM TOOTH EXTRACTION        Rolinda Rogue, MD 04/17/24 1622

## 2024-04-18 ENCOUNTER — Telehealth: Payer: Self-pay | Admitting: Nurse Practitioner

## 2024-04-18 NOTE — Telephone Encounter (Signed)
 PT called stated that she had lend on something it cause damage to her right rib. PT went to urgent care to be treated. Was prescribe Hydrocodone  by Dr. Redell Fort. PT stated that she will pick up prescription on today. PT will pcik up 15 pills that will last her for four days. PT will take medication every six hours. FYI. Please give patient a call if you need to ask anymore questions. TY

## 2024-04-18 NOTE — Telephone Encounter (Signed)
 Noted

## 2024-04-21 ENCOUNTER — Other Ambulatory Visit: Payer: Self-pay

## 2024-04-21 ENCOUNTER — Other Ambulatory Visit: Payer: Self-pay | Admitting: Medical Genetics

## 2024-04-23 ENCOUNTER — Ambulatory Visit (HOSPITAL_BASED_OUTPATIENT_CLINIC_OR_DEPARTMENT_OTHER): Admitting: Pain Medicine

## 2024-04-23 ENCOUNTER — Encounter: Payer: Self-pay | Admitting: Pain Medicine

## 2024-04-23 ENCOUNTER — Other Ambulatory Visit: Payer: Self-pay

## 2024-04-23 ENCOUNTER — Ambulatory Visit
Admission: RE | Admit: 2024-04-23 | Discharge: 2024-04-23 | Disposition: A | Discharge status: Home / Self Care | Source: Ambulatory Visit | Attending: Pain Medicine | Admitting: Pain Medicine

## 2024-04-23 VITALS — BP 121/87 | Temp 97.7°F | Resp 14 | Ht 64.0 in | Wt 175.0 lb

## 2024-04-23 DIAGNOSIS — G8929 Other chronic pain: Secondary | ICD-10-CM | POA: Diagnosis not present

## 2024-04-23 DIAGNOSIS — M4306 Spondylolysis, lumbar region: Secondary | ICD-10-CM

## 2024-04-23 DIAGNOSIS — M5459 Other low back pain: Secondary | ICD-10-CM | POA: Diagnosis not present

## 2024-04-23 DIAGNOSIS — M545 Low back pain, unspecified: Secondary | ICD-10-CM | POA: Diagnosis not present

## 2024-04-23 DIAGNOSIS — M47817 Spondylosis without myelopathy or radiculopathy, lumbosacral region: Secondary | ICD-10-CM | POA: Insufficient documentation

## 2024-04-23 DIAGNOSIS — G8918 Other acute postprocedural pain: Secondary | ICD-10-CM

## 2024-04-23 DIAGNOSIS — M47816 Spondylosis without myelopathy or radiculopathy, lumbar region: Secondary | ICD-10-CM | POA: Diagnosis not present

## 2024-04-23 DIAGNOSIS — M4317 Spondylolisthesis, lumbosacral region: Secondary | ICD-10-CM | POA: Diagnosis not present

## 2024-04-23 DIAGNOSIS — M4310 Spondylolisthesis, site unspecified: Secondary | ICD-10-CM | POA: Insufficient documentation

## 2024-04-23 MED ORDER — ROPIVACAINE HCL 2 MG/ML IJ SOLN
9.0000 mL | Freq: Once | INTRAMUSCULAR | Status: AC
  Administered 2024-04-23: 9 mL via PERINEURAL

## 2024-04-23 MED ORDER — FENTANYL CITRATE (PF) 100 MCG/2ML IJ SOLN
INTRAMUSCULAR | Status: AC
  Filled 2024-04-23: qty 2

## 2024-04-23 MED ORDER — MIDAZOLAM HCL 5 MG/5ML IJ SOLN
INTRAMUSCULAR | Status: AC
  Filled 2024-04-23: qty 5

## 2024-04-23 MED ORDER — TRIAMCINOLONE ACETONIDE 40 MG/ML IJ SUSP
40.0000 mg | Freq: Once | INTRAMUSCULAR | Status: AC
  Administered 2024-04-23: 40 mg

## 2024-04-23 MED ORDER — PENTAFLUOROPROP-TETRAFLUOROETH EX AERO
INHALATION_SPRAY | Freq: Once | CUTANEOUS | Status: AC
  Administered 2024-04-23: 30 via TOPICAL

## 2024-04-23 MED ORDER — TRIAMCINOLONE ACETONIDE 40 MG/ML IJ SUSP
INTRAMUSCULAR | Status: AC
  Filled 2024-04-23: qty 1

## 2024-04-23 MED ORDER — OXYCODONE-ACETAMINOPHEN 5-325 MG PO TABS
1.0000 | ORAL_TABLET | Freq: Three times a day (TID) | ORAL | 0 refills | Status: AC | PRN
  Filled 2024-04-30: qty 21, 7d supply, fill #0
  Filled ????-??-??: fill #0

## 2024-04-23 MED ORDER — LIDOCAINE HCL 2 % IJ SOLN
20.0000 mL | Freq: Once | INTRAMUSCULAR | Status: AC
  Administered 2024-04-23: 400 mg

## 2024-04-23 MED ORDER — ROPIVACAINE HCL 2 MG/ML IJ SOLN
INTRAMUSCULAR | Status: AC
  Filled 2024-04-23: qty 20

## 2024-04-23 MED ORDER — MIDAZOLAM HCL 5 MG/5ML IJ SOLN
0.5000 mg | Freq: Once | INTRAMUSCULAR | Status: AC
  Administered 2024-04-23: 3 mg via INTRAVENOUS

## 2024-04-23 MED ORDER — OXYCODONE-ACETAMINOPHEN 5-325 MG PO TABS
1.0000 | ORAL_TABLET | Freq: Three times a day (TID) | ORAL | 0 refills | Status: AC | PRN
  Filled 2024-04-23: qty 21, 7d supply, fill #0

## 2024-04-23 MED ORDER — LIDOCAINE HCL 2 % IJ SOLN
INTRAMUSCULAR | Status: AC
  Filled 2024-04-23: qty 20

## 2024-04-23 MED ORDER — FENTANYL CITRATE (PF) 100 MCG/2ML IJ SOLN
25.0000 ug | INTRAMUSCULAR | Status: DC | PRN
  Administered 2024-04-23: 100 ug via INTRAVENOUS

## 2024-04-23 NOTE — Patient Instructions (Signed)
 ______________________________________________________________________    Post-Radiofrequency (RF) Discharge Instructions  You have just completed a Radiofrequency Neurotomy.  The following instructions will provide you with information and guidelines for self-care upon discharge.  If at any time you have questions or concerns please call your physician. DO NOT DRIVE YOURSELF!!  Instructions: Apply ice: Fill a plastic sandwich bag with crushed ice. Cover it with a small towel and apply to injection site. Apply for 15 minutes then remove x 15 minutes. Repeat sequence on day of procedure, until you go to bed. The purpose is to minimize swelling and discomfort after procedure. Apply heat: Apply heat to procedure site starting the day following the procedure. The purpose is to treat any soreness and discomfort from the procedure. Food intake: No eating limitations, unless stipulated above.  Nevertheless, if you have had sedation, you may experience some nausea.  In this case, it may be wise to wait at least two hours prior to resuming regular diet. Physical activities: Keep activities to a minimum for the first 8 hours after the procedure. For the first 24 hours after the procedure, do not drive a motor vehicle,  Operate heavy machinery, power tools, or handle any weapons.  Consider walking with the use of an assistive device or accompanied by an adult for the first 24 hours.  Do not drink alcoholic beverages including beer.  Do not make any important decisions or sign any legal documents. Go home and rest today.  Resume activities tomorrow, as tolerated.  Use caution in moving about as you may experience mild leg weakness.  Use caution in cooking, use of household electrical appliances and climbing steps. Driving: If you have received any sedation, you are not allowed to drive for 24 hours after your procedure. Blood thinner: Restart your blood thinner 6 hours after your procedure. (Only for those taking  blood thinners) Insulin: As soon as you can eat, you may resume your normal dosing schedule. (Only for those taking insulin) Medications: May resume pre-procedure medications.  Do not take any drugs, other than what has been prescribed to you. Infection prevention: Keep procedure site clean and dry. Post-procedure Pain Diary: Extremely important that this be done correctly and accurately. Recorded information will be used to determine the next step in treatment. Pain evaluated is that of treated area only. Do not include pain from an untreated area. Complete every hour, on the hour, for the initial 8 hours. Set an alarm to help you do this part accurately. Do not go to sleep and have it completed later. It will not be accurate. Follow-up appointment: Keep your follow-up appointment after the procedure. Usually 2-6 weeks after radiofrequency. Bring you pain diary. The information collected will be essential for your long-term care.   Expect: From numbing medicine (AKA: Local Anesthetics): Numbness or decrease in pain. Onset: Full effect within 15 minutes of injected. Duration: It will depend on the type of local anesthetic used. On the average, 1 to 8 hours.  From steroids (when added): Decrease in swelling or inflammation. Once inflammation is improved, relief of the pain will follow. Onset of benefits: Depends on the amount of swelling present. The more swelling, the longer it will take for the benefits to be seen. In some cases, up to 10 days. Duration: Steroids will stay in the system x 2 weeks. Duration of benefits will depend on multiple posibilities including persistent irritating factors. From procedure: Some discomfort is to be expected once the numbing medicine wears off. In the case of  radiofrequency procedures, this may last as long as 6-8 weeks. Additional post-procedure pain medication is provided for this. Discomfort is minimized if ice and heat are applied as instructed.  Call  if: You experience numbness and weakness that gets worse with time, as opposed to wearing off. He experience any unusual bleeding, difficulty breathing, or loss of the ability to control your bowel and bladder. (This applies to Spinal procedures only) You experience any redness, swelling, heat, red streaks, elevated temperature, fever, or any other signs of a possible infection.  Emergency Numbers: Durning business hours (Monday - Thursday, 8:00 AM - 4:00 PM) (Friday, 9:00 AM - 12:00 Noon): [phone] After hours: [phone] ______________________________________________________________________     ______________________________________________________________________    Procedure instructions  Stop blood-thinners  Do not eat or drink fluids (other than water) for 6 hours before your procedure  No water for 2 hours before your procedure  Take your blood pressure medicine with a sip of water  Arrive 30 minutes before your appointment  If sedation is planned, bring suitable driver. [place], [place], & public transportation are NOT APPROVED)  Carefully read the Preparing for your procedure detailed instructions  If you have questions call us  at [phone]  Procedure appointments are for procedures only.   NO medication refills or new problem evaluations will be done on procedure days.   Only the scheduled, pre-approved procedure and side will be done.   ______________________________________________________________________     ______________________________________________________________________    Preparing for your procedure  Appointments: If you think you may not be able to keep your appointment, call 24-48 hours in advance to cancel. We need time to make it available to others.  Procedure visits are for procedures only. During your procedure appointment there will be: NO Prescription Refills*. NO medication changes or discussions*. NO discussion of  disability issues*. NO unrelated pain problem evaluations*. NO evaluations to order other pain procedures*. *These will be addressed at a separate and distinct evaluation encounter on the provider's evaluation schedule and not during procedure days.  Instructions: Food intake: Avoid eating anything solid for at least 8 hours prior to your procedure. Clear liquid intake: You may take clear liquids such as water up to 2 hours prior to your procedure. (No carbonated drinks. No soda.) Transportation: Unless otherwise stated by your physician, bring a driver. (Driver cannot be a Market researcher, Pharmacist, community, or any other form of public transportation.) Morning Medicines: Except for blood thinners, take all of your other morning medications with a sip of water. Make sure to take your heart and blood pressure medicines. If your blood pressure's lower number is above 100, the case will be rescheduled. Blood thinners: Make sure to stop your blood thinners as instructed.  If you take a blood thinner, but were not instructed to stop it, call our office [phone] and ask to talk to a nurse. Not stopping a blood thinner prior to certain procedures could lead to serious complications. Diabetics on insulin: Notify the staff so that you can be scheduled 1st case in the morning. If your diabetes requires high dose insulin, take only  of your normal insulin dose the morning of the procedure and notify the staff that you have done so. Preventing infections: Shower with an antibacterial soap the morning of your procedure.  Build-up your immune system: Take 1000 mg of Vitamin C with every meal (3 times a day) the day prior to your procedure. Antibiotics: Inform the nursing staff if you are taking any antibiotics  or if you have any conditions that may require antibiotics prior to procedures. (Example: recent joint implants)   Pregnancy: If you are pregnant make sure to notify the nursing staff. Not doing so may result in injury to the  fetus, including death.  Sickness: If you have a cold, fever, or any active infections, call and cancel or reschedule your procedure. Receiving steroids while having an infection may result in complications. Arrival: You must be in the facility at least 30 minutes prior to your scheduled procedure. Tardiness: Your scheduled time is also the cutoff time. If you do not arrive at least 15 minutes prior to your procedure, you will be rescheduled.  Children: Do not bring any children with you. Make arrangements to keep them home. Dress appropriately: There is always a possibility that your clothing may get soiled. Avoid long dresses. Valuables: Do not bring any jewelry or valuables.  Reasons to call and reschedule or cancel your procedure: (Following these recommendations will minimize the risk of a serious complication.) Surgeries: Avoid having procedures within 2 weeks of any surgery. (Avoid for 2 weeks before or after any surgery). Flu Shots: Avoid having procedures within 2 weeks of a flu shots or . (Avoid for 2 weeks before or after immunizations). Barium: Avoid having a procedure within 7-10 days after having had a radiological study involving the use of radiological contrast. (Myelograms, Barium swallow or enema study). Heart attacks: Avoid any elective procedures or surgeries for the initial 6 months after a Myocardial Infarction (Heart Attack). Blood thinners: It is imperative that you stop these medications before procedures. Let us  know if you if you take any blood thinner.  Infection: Avoid procedures during or within two weeks of an infection (including chest colds or gastrointestinal problems). Symptoms associated with infections include: Localized redness, fever, chills, night sweats or profuse sweating, burning sensation when voiding, cough, congestion, stuffiness, runny nose, sore throat, diarrhea, nausea, vomiting, cold or Flu symptoms, recent or current infections. It is specially  important if the infection is over the area that we intend to treat. Heart and lung problems: Symptoms that may suggest an active cardiopulmonary problem include: cough, chest pain, breathing difficulties or shortness of breath, dizziness, ankle swelling, uncontrolled high or unusually low blood pressure, and/or palpitations. If you are experiencing any of these symptoms, cancel your procedure and contact your primary care physician for an evaluation.  Remember:  Regular Business hours are:  Monday to Thursday 8:00 AM to 4:00 PM  Provider's Schedule: Eric Como, MD:  Procedure days: Tuesday and Thursday 7:30 AM to 4:00 PM  Wallie Sherry, MD:  Procedure days: Monday and Wednesday 7:30 AM to 4:00 PM Last  Updated: 07/11/2023 ______________________________________________________________________     ______________________________________________________________________    General Risks and Possible Complications  Patient Responsibilities: It is important that you read this as it is part of your informed consent. It is our duty to inform you of the risks and possible complications associated with treatments offered to you. It is your responsibility as a patient to read this and to ask questions about anything that is not clear or that you believe was not covered in this document.  Patient's Rights: You have the right to refuse treatment. You also have the right to change your mind, even after initially having agreed to have the treatment done. However, under this last option, if you wait until the last second to change your mind, you may be charged for the materials used up to that point.  Introduction: Medicine is not an Visual merchandiser. Everything in Medicine, including the lack of treatment(s), carries the potential for danger, harm, or loss (which is by definition: Risk). In Medicine, a complication is a secondary problem, condition, or disease that can aggravate an already existing one.  All treatments carry the risk of possible complications. The fact that a side effects or complications occurs, does not imply that the treatment was conducted incorrectly. It must be clearly understood that these can happen even when everything is done following the highest safety standards.  No treatment: You can choose not to proceed with the proposed treatment alternative. The "PRO(s)" would include: avoiding the risk of complications associated with the therapy. The "CON(s)" would include: not getting any of the treatment benefits. These benefits fall under one of three categories: diagnostic; therapeutic; and/or palliative. Diagnostic benefits include: getting information which can ultimately lead to improvement of the disease or symptom(s). Therapeutic benefits are those associated with the successful treatment of the disease. Finally, palliative benefits are those related to the decrease of the primary symptoms, without necessarily curing the condition (example: decreasing the pain from a flare-up of a chronic condition, such as incurable terminal cancer).  General Risks and Complications: These are associated to most interventional treatments. They can occur alone, or in combination. They fall under one of the following six (6) categories: no benefit or worsening of symptoms; bleeding; infection; nerve damage; allergic reactions; and/or death. No benefits or worsening of symptoms: In Medicine there are no guarantees, only probabilities. No healthcare provider can ever guarantee that a medical treatment will work, they can only state the probability that it may. Furthermore, there is always the possibility that the condition may worsen, either directly, or indirectly, as a consequence of the treatment. Bleeding: This is more common if the patient is taking a blood thinner, either prescription or over the counter (example: Goody Powders, Fish oil, Aspirin, Garlic, etc.), or if suffering a condition  associated with impaired coagulation (example: Hemophilia, cirrhosis of the liver, low platelet counts, etc.). However, even if you do not have one on these, it can still happen. If you have any of these conditions, or take one of these drugs, make sure to notify your treating physician. Infection: This is more common in patients with a compromised immune system, either due to disease (example: diabetes, cancer, human immunodeficiency virus [HIV], etc.), or due to medications or treatments (example: therapies used to treat cancer and rheumatological diseases). However, even if you do not have one on these, it can still happen. If you have any of these conditions, or take one of these drugs, make sure to notify your treating physician. Nerve Damage: This is more common when the treatment is an invasive one, but it can also happen with the use of medications, such as those used in the treatment of cancer. The damage can occur to small secondary nerves, or to large primary ones, such as those in the spinal cord and brain. This damage may be temporary or permanent and it may lead to impairments that can range from temporary numbness to permanent paralysis and/or brain death. Allergic Reactions: Any time a substance or material comes in contact with our body, there is the possibility of an allergic reaction. These can range from a mild skin rash (contact dermatitis) to a severe systemic reaction (anaphylactic reaction), which can result in death. Death: In general, any medical intervention can result in death, most of the time due to an unforeseen complication. ______________________________________________________________________  ______________________________________________________________________    Steroid injections  Common steroids for injections Triamcinolone : Used by many sports medicine physicians for large joint and bursal injections, often combined with a local anesthetic like lidocaine . A  study focusing on coccydynia (tailbone pain) found triamcinolone  was more effective than betamethasone, suggesting it may also be preferable for other localized inflammation conditions. Methylprednisolone : A common alternative to triamcinolone  that is also a strong anti-inflammatory. It is available in different formulations, with the acetate suspension being the long-acting option for intra-articular injections. Dexamethasone : This is a non-particulate steroid, meaning it has a lower risk of tissue damage compared to particulate steroids like triamcinolone  and methylprednisolone . While less common for this specific use, it is an option for targeted injections.   Considerations for physicians Particulate vs. non-particulate steroids: Triamcinolone  and methylprednisolone  are particulate, meaning they can clump together. Dexamethasone  is non-particulate. Particulate steroids are often preferred for their longer-lasting effects but carry a theoretical higher risk for certain injections (though this is less of a concern in the costochondral joints). Combined injectate: Corticosteroids are typically mixed with a local anesthetic like lidocaine  to provide both immediate pain relief (from the anesthetic) and longer-term inflammation reduction (from the steroid). Imaging guidance: To ensure accurate placement of the needle and medication, physicians may use ultrasound or fluoroscopic guidance for the injection, especially in complex or refractory cases.   Patient guidance Before undergoing a steroid injection, discuss the options with your physician. They will determine the best steroid, dosage, and procedure for your specific case based on factors like: Severity of your condition History of response to other treatments Your overall health status Experience and preference of the physician  Last  Updated: 03/26/2024 ______________________________________________________________________

## 2024-04-23 NOTE — Progress Notes (Signed)
 Safety precautions to be maintained throughout the outpatient stay will include: orient to surroundings, keep bed in low position, maintain call bell within reach at all times, provide assistance with transfer out of bed and ambulation.

## 2024-04-23 NOTE — Progress Notes (Signed)
 PROVIDER NOTE: Interpretation of information contained herein should be left to medically-trained personnel. Specific patient instructions are provided elsewhere under Patient Instructions section of medical record. This document was created in part using STT-dictation technology, any transcriptional errors that may result from this process are unintentional.  Patient: Denise Burns Type: Established DOB: 07-28-1971 MRN: [id number] PCP: Chandra Toribio POUR, MD  Service: Procedure DOS: 04/23/2024 Setting: Ambulatory Location: Ambulatory outpatient facility Delivery: Face-to-face Provider: Eric DELENA Como, MD Specialty: Interventional Pain Management Specialty designation: 09 Location: Outpatient facility Ref. Prov.: Chandra Toribio POUR, MD       Interventional Therapy   Procedure: Lumbar Facet, Medial Branch Radiofrequency Ablation (RFA) #4  Laterality: Right (-RT)  Level: L2, L3, L4, L5, and S1 Medial Branch Level(s). These levels will denervate the L3-4, L4-5, and L5-S1 lumbar facet joints.  Imaging: Fluoroscopy-guided Spinal ([id number]) Anesthesia: Local anesthesia (1-2% Lidocaine ) Anxiolysis: IV Versed  3.0 mg Sedation: Minimal Sedation Fentanyl  2.0 mL (100 mcg) DOS: 04/23/2024  Performed by: Eric DELENA Como, MD  Purpose: Therapeutic/Palliative Indications: Low back pain severe enough to impact quality of life or function. Indications: 1. Chronic low back pain (1ry area of Pain) (Bilateral) (R>L) w/o sciatica   2. Low back pain of over 3 months duration   3. Lumbar facet joint pain   4. Lumbosacral Anterolisthesis of L5/S1 (Dynamic) (N/E:76mm  F:31mm)   5. Lumbar facet hypertrophy (Multilevel) (Bilateral)   6. L5-S1 pars defect w/ spondylolisthesis (Bilateral)   7. Lumbar facet syndrome (Bilateral)   8. Lumbar pars defect (L5-S1) (Bilateral)   9. Spondylosis without myelopathy or radiculopathy, lumbosacral region    Denise Burns has been dealing with the above chronic  pain for longer than three months and has either failed to respond, was unable to tolerate, or simply did not get enough benefit from other more conservative therapies including, but not limited to: 1. Over-the-counter medications 2. Anti-inflammatory medications 3. Muscle relaxants 4. Membrane stabilizers 5. Opioids 6. Physical therapy and/or chiropractic manipulation 7. Modalities (Heat, ice, etc.) 8. Invasive techniques such as nerve blocks. Denise Burns has attained more than 50% relief of the pain from a series of diagnostic injections conducted in separate occasions.  Pain Score: Pre-procedure: 4 /10 Post-procedure: 0-No pain/10     Position / Prep / Materials:  Position: Prone  Prep solution: ChloraPrep (2% chlorhexidine  gluconate and 70% isopropyl alcohol) Prep Area: Entire Lumbosacral Region (Lower back from mid-thoracic region to end of tailbone and from flank to flank.) Materials:  Tray: RFA (Radiofrequency) tray Needle(s):  Type: RFA (Teflon-coated radiofrequency ablation needles) Gauge (G): 22  Length: Regular (10cm) Qty: 5     H&P (Pre-op Assessment):  Denise Burns is a 53 y.o. (year old), female patient, seen today for interventional treatment. She  has a past surgical history that includes Cholecystectomy; Polypectomy; dental work; Dilation and curettage of uterus (2010); IR NEPHROSTOMY PLACEMENT LEFT (12/14/2020); Nephrolithotomy (Left, 12/14/2020); Wisdom tooth extraction; Diagnostic laparoscopy (2004); Cystoscopy/ureteroscopy/holmium laser/stent placement (Right, 06/28/2021); Cystoscopy w/ ureteral stent removal (Right, 08/09/2021); Cystoscopy w/ retrogrades (Right, 08/09/2021); Cystoscopy/ureteroscopy/holmium laser/stent placement (Right, 08/09/2021); and Colonoscopy with propofol  (N/A, 06/08/2022). Denise Burns has a current medication list which includes the following prescription(s): azelastine  hcl, cetirizine, escitalopram , fludrocortisone , flonase  sensimist,  hydrocodone -acetaminophen , [START ON 05/22/2024] hydrocodone -acetaminophen , hydrocodone -acetaminophen , hydrocortisone , lansoprazole , meloxicam , urogesic-blue, naloxone , omega-3 acid ethyl esters, oxycodone -acetaminophen , [START ON 04/30/2024] oxycodone -acetaminophen , pseudoephedrine , rosuvastatin , and tizanidine , and the following Facility-Administered Medications: fentanyl . Her primarily concern today is the Back Pain and Hip Pain  Initial Vital Signs:  Pulse/HCG Rate:  ECG Heart Rate: 76 (nsr) Temp: (!) 97.2 F (36.2 C) Resp: 16 BP: (!) 143/99 SpO2: 97 %  BMI: Estimated body mass index is 30.04 kg/m as calculated from the following:   Height as of this encounter: 5' 4 (1.626 m).   Weight as of this encounter: 175 lb (79.4 kg).  Risk Assessment: Allergies: Reviewed. She is allergic to tramadol, lisinopril, and sulfa antibiotics.  Allergy Precautions: None required Coagulopathies: Reviewed. None identified.  Blood-thinner therapy: None at this time Active Infection(s): Reviewed. None identified. Denise Burns is afebrile  Site Confirmation: Denise Burns was asked to confirm the procedure and laterality before marking the site Procedure checklist: Completed Consent: Before the procedure and under the influence of no sedative(s), amnesic(s), or anxiolytics, the patient was informed of the treatment options, risks and possible complications. To fulfill our ethical and legal obligations, as recommended by the American Medical Association's Code of Ethics, I have informed the patient of my clinical impression; the nature and purpose of the treatment or procedure; the risks, benefits, and possible complications of the intervention; the alternatives, including doing nothing; the risk(s) and benefit(s) of the alternative treatment(s) or procedure(s); and the risk(s) and benefit(s) of doing nothing. The patient was provided information about the general risks and possible complications associated with  the procedure. These may include, but are not limited to: failure to achieve desired goals, infection, bleeding, organ or nerve damage, allergic reactions, paralysis, and death. In addition, the patient was informed of those risks and complications associated to Spine-related procedures, such as failure to decrease pain; infection (i.e.: Meningitis, epidural or intraspinal abscess); bleeding (i.e.: epidural hematoma, subarachnoid hemorrhage, or any other type of intraspinal or peri-dural bleeding); organ or nerve damage (i.e.: Any type of peripheral nerve, nerve root, or spinal cord injury) with subsequent damage to sensory, motor, and/or autonomic systems, resulting in permanent pain, numbness, and/or weakness of one or several areas of the body; allergic reactions; (i.e.: anaphylactic reaction); and/or death. Furthermore, the patient was informed of those risks and complications associated with the medications. These include, but are not limited to: allergic reactions (i.e.: anaphylactic or anaphylactoid reaction(s)); adrenal axis suppression; blood sugar elevation that in diabetics may result in ketoacidosis or comma; water retention that in patients with history of congestive heart failure may result in shortness of breath, pulmonary edema, and decompensation with resultant heart failure; weight gain; swelling or edema; medication-induced neural toxicity; particulate matter embolism and blood vessel occlusion with resultant organ, and/or nervous system infarction; and/or aseptic necrosis of one or more joints. Finally, the patient was informed that Medicine is not an exact science; therefore, there is also the possibility of unforeseen or unpredictable risks and/or possible complications that may result in a catastrophic outcome. The patient indicated having understood very clearly. We have given the patient no guarantees and we have made no promises. Enough time was given to the patient to ask questions, all  of which were answered to the patient's satisfaction. Ms. Patch has indicated that she wanted to continue with the procedure. Attestation: I, the ordering provider, attest that I have discussed with the patient the benefits, risks, side-effects, alternatives, likelihood of achieving goals, and potential problems during recovery for the procedure that I have provided informed consent. Date  Time: 04/23/2024  8:11 AM  Pre-Procedure Preparation:  Monitoring: As per clinic protocol. Respiration, ETCO2, SpO2, BP, heart rate and rhythm monitor placed and checked for adequate function Safety Precautions: Patient was assessed for positional comfort and pressure points before  starting the procedure. Time-out: I initiated and conducted the Time-out before starting the procedure, as per protocol. The patient was asked to participate by confirming the accuracy of the Time Out information. Verification of the correct person, site, and procedure were performed and confirmed by me, the nursing staff, and the patient. Time-out conducted as per Joint Commission's Universal Protocol (UP.01.01.01). Time: 0834 Start Time: 0834 hrs.  Description of Procedure:          Laterality: See above. Levels:  See above. Safety Precautions: Aspiration looking for blood return was conducted prior to all injections. At no point did we inject any substances, as a needle was being advanced. Before injecting, the patient was told to immediately notify me if she was experiencing any new onset of ringing in the ears, or metallic taste in the mouth. No attempts were made at seeking any paresthesias. Safe injection practices and needle disposal techniques used. Medications properly checked for expiration dates. SDV (single dose vial) medications used. After the completion of the procedure, all disposable equipment used was discarded in the proper designated medical waste containers. Local Anesthesia: Protocol guidelines were  followed. The patient was positioned over the fluoroscopy table. The area was prepped in the usual manner. The time-out was completed. The target area was identified using fluoroscopy. A 12-in long, straight, sterile hemostat was used with fluoroscopic guidance to locate the targets for each level blocked. Once located, the skin was marked with an approved surgical skin marker. Once all sites were marked, the skin (epidermis, dermis, and hypodermis), as well as deeper tissues (fat, connective tissue and muscle) were infiltrated with a small amount of a short-acting local anesthetic, loaded on a 10cc syringe with a 25G, 1.5-in  Needle. An appropriate amount of time was allowed for local anesthetics to take effect before proceeding to the next step. Technical description of process:  Radiofrequency Ablation (RFA) L2 Medial Branch Nerve RFA: The target area for the L2 medial branch is at the junction of the postero-lateral aspect of the superior articular process and the superior, posterior, and medial edge of the transverse process of L3. Under fluoroscopic guidance, a Radiofrequency needle was inserted until contact was made with os over the superior postero-lateral aspect of the pedicular shadow (target area). Sensory and motor testing was conducted to properly adjust the position of the needle. Once satisfactory placement of the needle was achieved, the numbing solution was slowly injected after negative aspiration for blood. 2.0 mL of the nerve block solution was injected without difficulty or complication. After waiting for at least 3 minutes, the ablation was performed. Once completed, the needle was removed intact. L3 Medial Branch Nerve RFA: The target area for the L3 medial branch is at the junction of the postero-lateral aspect of the superior articular process and the superior, posterior, and medial edge of the transverse process of L4. Under fluoroscopic guidance, a Radiofrequency needle was inserted  until contact was made with os over the superior postero-lateral aspect of the pedicular shadow (target area). Sensory and motor testing was conducted to properly adjust the position of the needle. Once satisfactory placement of the needle was achieved, the numbing solution was slowly injected after negative aspiration for blood. 2.0 mL of the nerve block solution was injected without difficulty or complication. After waiting for at least 3 minutes, the ablation was performed. Once completed, the needle was removed intact. L4 Medial Branch Nerve RFA: The target area for the L4 medial branch is at the junction of the  postero-lateral aspect of the superior articular process and the superior, posterior, and medial edge of the transverse process of L5. Under fluoroscopic guidance, a Radiofrequency needle was inserted until contact was made with os over the superior postero-lateral aspect of the pedicular shadow (target area). Sensory and motor testing was conducted to properly adjust the position of the needle. Once satisfactory placement of the needle was achieved, the numbing solution was slowly injected after negative aspiration for blood. 2.0 mL of the nerve block solution was injected without difficulty or complication. After waiting for at least 3 minutes, the ablation was performed. Once completed, the needle was removed intact. L5 Medial Branch Nerve RFA: The target area for the L5 medial branch is at the junction of the postero-lateral aspect of the superior articular process of S1 and the superior, posterior, and medial edge of the sacral ala. Under fluoroscopic guidance, a Radiofrequency needle was inserted until contact was made with os over the superior postero-lateral aspect of the pedicular shadow (target area). Sensory and motor testing was conducted to properly adjust the position of the needle. Once satisfactory placement of the needle was achieved, the numbing solution was slowly injected after  negative aspiration for blood. 2.0 mL of the nerve block solution was injected without difficulty or complication. After waiting for at least 3 minutes, the ablation was performed. Once completed, the needle was removed intact. S1 Medial Branch Nerve RFA: The target area for the S1 medial branch is located inferior to the junction of the S1 superior articular process and the L5 inferior articular process, posterior, inferior, and lateral to the 6 o'clock position of the L5-S1 facet joint, just superior to the S1 posterior foramen. Under fluoroscopic guidance, the Radiofrequency needle was advanced until contact was made with os over the Target area. Sensory and motor testing was conducted to properly adjust the position of the needle. Once satisfactory placement of the needle was achieved, the numbing solution was slowly injected after negative aspiration for blood. 2.0 mL of the nerve block solution was injected without difficulty or complication. After waiting for at least 3 minutes, the ablation was performed. Once completed, the needle was removed intact. Radiofrequency lesioning (ablation):  Radiofrequency Generator: Medtronic AccurianTM AG 1000 RF Generator Sensory Stimulation Parameters: 50 Hz was used to locate & identify the nerve, making sure that the needle was positioned such that there was no sensory stimulation below 0.3 V or above 0.7 V. Motor Stimulation Parameters: 2 Hz was used to evaluate the motor component. Care was taken not to lesion any nerves that demonstrated motor stimulation of the lower extremities at an output of less than 2.5 times that of the sensory threshold, or a maximum of 2.0 V. Lesioning Technique Parameters: Standard Radiofrequency settings. (Not bipolar or pulsed.) Temperature Settings: 80 degrees C Lesioning time: 60 seconds Stationary intra-operative compliance: Compliant  Once the entire procedure was completed, the treated area was cleaned, making sure to leave  some of the prepping solution back to take advantage of its long term bactericidal properties.    Illustration of the posterior view of the lumbar spine and the posterior neural structures. Laminae of L2 through S1 are labeled. DPRL5, dorsal primary ramus of L5; DPRS1, dorsal primary ramus of S1; DPR3, dorsal primary ramus of L3; FJ, facet (zygapophyseal) joint L3-L4; I, inferior articular process of L4; LB1, lateral branch of dorsal primary ramus of L1; IAB, inferior articular branches from L3 medial branch (supplies L4-L5 facet joint); IBP, intermediate branch plexus; MB3, medial  branch of dorsal primary ramus of L3; NR3, third lumbar nerve root; S, superior articular process of L5; SAB, superior articular branches from L4 (supplies L4-5 facet joint also); TP3, transverse process of L3.  Facet Joint Innervation (* possible contribution)  L1-2 T12, L1 (L2*)  Medial Branch  L2-3 L1, L2 (L3*)                     L3-4 L2, L3 (L4*)                     L4-5 L3, L4 (L5*)                     L5-S1 L4, L5, S1                        Vitals:   04/23/24 0903 04/23/24 0910 04/23/24 0920 04/23/24 0930  BP: (!) 130/101 97/86 114/86 121/87  Resp: 16 18 15 14   Temp:  97.7 F (36.5 C)  97.7 F (36.5 C)  SpO2: 97% 95% 97% 98%  Weight:      Height:        Start Time: 0834 hrs. End Time: 0903 hrs.  Imaging Guidance (Spinal):         Type of Imaging Technique: Fluoroscopy Guidance (Spinal) Indication(s): Fluoroscopy guidance for needle placement to enhance accuracy in procedures requiring precise needle localization for targeted delivery of medication in or near specific anatomical locations not easily accessible without such real-time imaging assistance. Exposure Time: Please see nurses notes. Contrast: None used. Fluoroscopic Guidance: I was personally present during the use of fluoroscopy. Tunnel Vision Technique used to obtain the best possible view of the target area. Parallax error  corrected before commencing the procedure. Direction-depth-direction technique used to introduce the needle under continuous pulsed fluoroscopy. Once target was reached, antero-posterior, oblique, and lateral fluoroscopic projection used confirm needle placement in all planes. Images permanently stored in EMR. Interpretation: No contrast injected. I personally interpreted the imaging intraoperatively. Adequate needle placement confirmed in multiple planes. Permanent images saved into the patient's record.  Antibiotic Prophylaxis:   Anti-infectives (From admission, onward)    None      Indication(s): None identified  Post-operative Assessment:  Post-procedure Vital Signs:  Pulse/HCG Rate:  82 Temp: 97.7 F (36.5 C) Resp: 14 BP: 121/87 SpO2: 98 %  EBL: None  Complications: No immediate post-treatment complications observed by team, or reported by patient.  Note: The patient tolerated the entire procedure well. A repeat set of vitals were taken after the procedure and the patient was kept under observation following institutional policy, for this type of procedure. Post-procedural neurological assessment was performed, showing return to baseline, prior to discharge. The patient was provided with post-procedure discharge instructions, including a section on how to identify potential problems. Should any problems arise concerning this procedure, the patient was given instructions to immediately contact us , at any time, without hesitation. In any case, we plan to contact the patient by telephone for a follow-up status report regarding this interventional procedure.  Comments:  No additional relevant information.  Plan of Care (POC)  Orders:  Orders Placed This Encounter  Procedures   Radiofrequency,Lumbar    Scheduling Instructions:     Side(s): Right-sided     Level: L3-4, L4-5, and L5-S1 Facets (L2, L3, L4, L5, and S1 Medial Branch)     Sedation: With Sedation.     Date:  04/23/2024  Where will this procedure be performed?:   [facility]   Radiofrequency,Lumbar    Standing Status:   Future    Expiration Date:   07/23/2024    Scheduling Instructions:     Side(s): Left-sided     Level: L3-4, L4-5, and L5-S1 Facets (L2, L3, L4, L5, and S1 Medial Branch)     Sedation: With Sedation.     Timeframe: 2 weeks from now.    Where will this procedure be performed?:   [facility]   DG PAIN [facility] C-ARM 1-60 MIN NO REPORT    Intraoperative interpretation by procedural physician at [facility].    Standing Status:   Standing    Number of Occurrences:   1    Reason for exam::   Assistance in needle guidance and placement for procedures requiring needle placement in or near specific anatomical locations not easily accessible without such assistance.   Informed Consent Details: Physician/Practitioner Attestation; Transcribe to consent form and obtain patient signature    Nursing Order: Transcribe to consent form and obtain patient signature. Note: Always confirm laterality of pain with Ms. Sammie, before procedure.    Physician/Practitioner attestation of informed consent for procedure/surgical case:   I, the physician/practitioner, attest that I have discussed with the patient the benefits, risks, side effects, alternatives, likelihood of achieving goals and potential problems during recovery for the procedure that I have provided informed consent.    Procedure:   Lumbar Facet Radiofrequency Ablation    Physician/Practitioner performing the procedure:   Glendy Barsanti A. Tanya, MD    Indication/Reason:   Low Back Pain, with our without leg pain, due to Facet Joint Arthralgia (Joint Pain) known as Lumbar Facet Syndrome, secondary to Lumbar, and/or Lumbosacral Spondylosis (Arthritis of the Spine), without myelopathy or radiculopathy (Nerve Damage).   Provide equipment / supplies at bedside    Procedure tray: Radiofrequency Tray Additional  material: Large hemostat (x1); Small hemostat (x1); Towels (x8); 4x4 sterile sponge pack (x1) Needle type: Teflon-coated Radiofrequency Needle (Disposable  single use) Size: Regular Quantity: 5    Standing Status:   Standing    Number of Occurrences:   1    Specify:   Radiofrequency Tray   Saline lock IV    Have LR [id number] mL available and administer at 125 mL/hr if patient becomes hypotensive.    Standing Status:   Standing    Number of Occurrences:   1     Opioid Analgesic(s): Hydrocodone /APAP 7.5/325 1 tablet p.o. 3 times daily (22.5 mg/day of hydrocodone )  MME/day: 22.5 mg/day.    Medications ordered for procedure: Meds ordered this encounter  Medications   lidocaine  (XYLOCAINE ) 2 % (with pres) injection 400 mg   pentafluoroprop-tetrafluoroeth (GEBAUERS) aerosol   midazolam  (VERSED ) 5 MG/5ML injection 0.5-2 mg    Make sure Flumazenil is available in the pyxis when using this medication. If oversedation occurs, administer 0.2 mg IV over 15 sec. If after 45 sec no response, administer 0.2 mg again over 1 min; may repeat at 1 min intervals; not to exceed 4 doses (1 mg)   fentaNYL  (SUBLIMAZE ) injection 25-50 mcg    Make sure Narcan  is available in the pyxis when using this medication. In the event of respiratory depression (RR< 8/min): Titrate NARCAN  (naloxone ) in increments of 0.1 to 0.2 mg IV at 2-3 minute intervals, until desired degree of reversal.   ropivacaine  (PF) 2 mg/mL (0.2%) (NAROPIN ) injection 9 mL   triamcinolone  acetonide (KENALOG -40) injection 40 mg  oxyCODONE -acetaminophen  (PERCOCET) 5-325 MG tablet    Sig: Take 1 tablet by mouth every 8 (eight) hours as needed for up to 7 days for severe pain (pain score 7-10). Must last 7 days.    Dispense:  21 tablet    Refill:  0    For acute post-operative pain. Not to be refilled.  Must last 7 days.   oxyCODONE -acetaminophen  (PERCOCET) 5-325 MG tablet    Sig: Take 1 tablet by mouth every 8 (eight) hours as needed for up to  7 days for severe pain (pain score 7-10). Must last 7 days.    Dispense:  21 tablet    Refill:  0    For acute post-operative pain. Not to be refilled.  Must last 7 days.   Medications administered: We administered lidocaine , pentafluoroprop-tetrafluoroeth, midazolam , fentaNYL , ropivacaine  (PF) 2 mg/mL (0.2%), and triamcinolone  acetonide.  See the medical record for exact dosing, route, and time of administration.    Interventional Therapies  Risk  Complexity Considerations:   WNL   Planned  Pending:   Therapeutic bilateral lumbar facet MB RFA #4 (Starting on right side)   Under consideration:   Possible spinal cord stimulator trial (information about device provided on 02/27/2024)  Pending evaluation by Dr. Katrina for neurosurgical alternatives. Therapeutic right L2-3 LESI #3  Therapeutic bilateral lumbar facet MB RFA #4    Completed:   Diagnostic right L2-3 LESI x2 (01/23/2024) (100/100/60/60-100)  Therapeutic bilateral IA hip + bilateral trochanteric bursa inj. R5L4 (02/13/2024) (100/100/75/75)  Therapeutic bilateral lumbar facet MBB x3 (09/08/2020) (4-0) (100/100/60/60)  Therapeutic right lumbar facet RFA x3 (02/21/2023) (4-4) (100/100/80/80)  Therapeutic left lumbar facet RFA x3 (03/09/2023) (3-0) (100/100/90/80-90%)  Therapeutic right IA hip inj. x2 (08/20/2020) (5-1) (100/100/80/80)  Therapeutic left IA hip inj. x1 (08/20/2020) (5-1) (100/100/80/80)  Therapeutic bilateral trochanteric bursa inj. x3 (04/07/2022)  (100/100/90/90)  Neurosurgery consult for evaluation of dynamic lumbar spondylosis and possible surgical alternatives (09/20/2023)   Therapeutic  Palliative (PRN) options:   Palliative lumbar facet RFA  Palliative Hip & trochanteric bursa inj.    Pharmacotherapy  Nonopioids transferred 05/11/2020: Baclofen  and Mobic         Follow-up plan:   Return in about 2 weeks (around 05/07/2024) for (ECT): (L) L-FCT RFA #4.     Recent Visits Date Type Provider Dept   03/20/24 Office Visit Tanya Glisson, MD [facility]  03/19/24 Office Visit Patel, Seema K, NP [facility]  02/27/24 Office Visit Tanya Glisson, MD [facility]  02/13/24 Procedure visit Tanya Glisson, MD [facility]  02/06/24 Office Visit Tanya Glisson, MD [facility]  Showing recent visits within past 90 days and meeting all other requirements Today's Visits Date Type Provider Dept  04/23/24 Procedure visit Tanya Glisson, MD [facility]  Showing today's visits and meeting all other requirements Future Appointments Date Type Provider Dept  06/10/24 Appointment Patel, Seema K, NP [facility]  Showing future appointments within next 90 days and meeting all other requirements   Disposition: Discharge home  Discharge (Date  Time): 04/23/2024; 0932 hrs.   Primary Care Physician: Chandra Toribio POUR, MD Location: [facility] Note by: Glisson DELENA Tanya, MD (TTS technology used. I apologize for any typographical errors that were not detected and corrected.) Date: 04/23/2024; Time: 9:40 AM  Disclaimer:  Medicine is not an Visual merchandiser. The only guarantee in medicine is that nothing is guaranteed. It is important to note that the decision to proceed with  this intervention was based on the information collected from the patient. The Data and conclusions were drawn from the patient's questionnaire, the interview, and the physical examination. Because the information was provided in large part by the patient, it cannot be guaranteed that it has not been purposely or unconsciously manipulated. Every effort has been made to obtain as much relevant data as possible for this evaluation. It is important to note that the conclusions that lead to this procedure are derived in large part from the available data. Always take into account that the treatment will also be dependent on  availability of resources and existing treatment guidelines, considered by other Pain Management Practitioners as being common knowledge and practice, at the time of the intervention. For Medico-Legal purposes, it is also important to point out that variation in procedural techniques and pharmacological choices are the acceptable norm. The indications, contraindications, technique, and results of the above procedure should only be interpreted and judged by a Board-Certified Interventional Pain Specialist with extensive familiarity and expertise in the same exact procedure and technique.

## 2024-04-24 ENCOUNTER — Telehealth: Payer: Self-pay

## 2024-04-24 NOTE — Telephone Encounter (Signed)
 Post procedure follow up.  LM

## 2024-04-26 ENCOUNTER — Ambulatory Visit: Admitting: Family Medicine

## 2024-04-30 ENCOUNTER — Other Ambulatory Visit: Payer: Self-pay

## 2024-05-07 ENCOUNTER — Ambulatory Visit: Admitting: Neurosurgery

## 2024-05-12 ENCOUNTER — Other Ambulatory Visit: Payer: Self-pay | Admitting: Family Medicine

## 2024-05-12 DIAGNOSIS — G8929 Other chronic pain: Secondary | ICD-10-CM

## 2024-05-13 ENCOUNTER — Other Ambulatory Visit: Payer: Self-pay

## 2024-05-13 MED ORDER — TIZANIDINE HCL 4 MG PO TABS
4.0000 mg | ORAL_TABLET | Freq: Three times a day (TID) | ORAL | 1 refills | Status: AC | PRN
  Filled 2024-05-13: qty 90, 30d supply, fill #0

## 2024-05-14 ENCOUNTER — Ambulatory Visit: Admitting: Family Medicine

## 2024-05-30 ENCOUNTER — Encounter: Payer: Self-pay | Admitting: Pain Medicine

## 2024-05-30 ENCOUNTER — Ambulatory Visit (HOSPITAL_BASED_OUTPATIENT_CLINIC_OR_DEPARTMENT_OTHER): Admitting: Pain Medicine

## 2024-05-30 ENCOUNTER — Ambulatory Visit
Admission: RE | Admit: 2024-05-30 | Discharge: 2024-05-30 | Disposition: A | Discharge status: Home / Self Care | Source: Ambulatory Visit | Attending: Pain Medicine | Admitting: Pain Medicine

## 2024-05-30 VITALS — BP 151/84 | HR 87 | Temp 98.1°F | Resp 15 | Ht 64.0 in | Wt 175.0 lb

## 2024-05-30 DIAGNOSIS — M4317 Spondylolisthesis, lumbosacral region: Secondary | ICD-10-CM

## 2024-05-30 DIAGNOSIS — M47817 Spondylosis without myelopathy or radiculopathy, lumbosacral region: Secondary | ICD-10-CM

## 2024-05-30 DIAGNOSIS — M47816 Spondylosis without myelopathy or radiculopathy, lumbar region: Secondary | ICD-10-CM | POA: Diagnosis not present

## 2024-05-30 DIAGNOSIS — M4306 Spondylolysis, lumbar region: Secondary | ICD-10-CM | POA: Diagnosis not present

## 2024-05-30 DIAGNOSIS — M545 Low back pain, unspecified: Secondary | ICD-10-CM | POA: Diagnosis not present

## 2024-05-30 DIAGNOSIS — Z09 Encounter for follow-up examination after completed treatment for conditions other than malignant neoplasm: Secondary | ICD-10-CM | POA: Insufficient documentation

## 2024-05-30 DIAGNOSIS — M5459 Other low back pain: Secondary | ICD-10-CM | POA: Insufficient documentation

## 2024-05-30 DIAGNOSIS — M4310 Spondylolisthesis, site unspecified: Secondary | ICD-10-CM | POA: Diagnosis not present

## 2024-05-30 DIAGNOSIS — G8929 Other chronic pain: Secondary | ICD-10-CM | POA: Insufficient documentation

## 2024-05-30 DIAGNOSIS — G8918 Other acute postprocedural pain: Secondary | ICD-10-CM | POA: Diagnosis not present

## 2024-05-30 MED ORDER — ROPIVACAINE HCL 2 MG/ML IJ SOLN
9.0000 mL | Freq: Once | INTRAMUSCULAR | Status: AC
  Administered 2024-05-30: 9 mL via PERINEURAL

## 2024-05-30 MED ORDER — LIDOCAINE HCL 2 % IJ SOLN
INTRAMUSCULAR | Status: AC
  Filled 2024-05-30: qty 20

## 2024-05-30 MED ORDER — OXYCODONE-ACETAMINOPHEN 5-325 MG PO TABS: 1.0000 | ORAL_TABLET | Freq: Three times a day (TID) | ORAL | 0 refills | Status: AC | PRN

## 2024-05-30 MED ORDER — FENTANYL CITRATE (PF) 100 MCG/2ML IJ SOLN
25.0000 ug | INTRAMUSCULAR | Status: DC | PRN
  Administered 2024-05-30: 50 ug via INTRAVENOUS

## 2024-05-30 MED ORDER — TRIAMCINOLONE ACETONIDE 40 MG/ML IJ SUSP
INTRAMUSCULAR | Status: AC
  Filled 2024-05-30: qty 1

## 2024-05-30 MED ORDER — TRIAMCINOLONE ACETONIDE 40 MG/ML IJ SUSP
40.0000 mg | Freq: Once | INTRAMUSCULAR | Status: AC
  Administered 2024-05-30: 40 mg

## 2024-05-30 MED ORDER — MIDAZOLAM HCL 5 MG/5ML IJ SOLN
INTRAMUSCULAR | Status: AC
  Filled 2024-05-30: qty 5

## 2024-05-30 MED ORDER — ROPIVACAINE HCL 2 MG/ML IJ SOLN
INTRAMUSCULAR | Status: AC
  Filled 2024-05-30: qty 20

## 2024-05-30 MED ORDER — FENTANYL CITRATE (PF) 100 MCG/2ML IJ SOLN
INTRAMUSCULAR | Status: AC
  Filled 2024-05-30: qty 2

## 2024-05-30 MED ORDER — PENTAFLUOROPROP-TETRAFLUOROETH EX AERO
INHALATION_SPRAY | Freq: Once | CUTANEOUS | Status: AC
  Administered 2024-05-30: 30 via TOPICAL

## 2024-05-30 MED ORDER — LIDOCAINE HCL 2 % IJ SOLN
20.0000 mL | Freq: Once | INTRAMUSCULAR | Status: AC
  Administered 2024-05-30: 400 mg

## 2024-05-30 MED ORDER — MIDAZOLAM HCL 5 MG/5ML IJ SOLN
0.5000 mg | Freq: Once | INTRAMUSCULAR | Status: AC
  Administered 2024-05-30: 3 mg via INTRAVENOUS

## 2024-05-30 NOTE — Progress Notes (Signed)
 PROVIDER NOTE: Interpretation of information contained herein should be left to medically-trained personnel. Specific patient instructions are provided elsewhere under Patient Instructions section of medical record. This document was created in part using STT-dictation technology, any transcriptional errors that may result from this process are unintentional.  Patient: Denise Burns Type: Established DOB: 10/31/1970 MRN: [id number] PCP: Chandra Toribio POUR, MD  Service: Procedure DOS: 05/30/2024 Setting: Ambulatory Location: Ambulatory outpatient facility Delivery: Face-to-face Provider: Eric DELENA Como, MD Specialty: Interventional Pain Management Specialty designation: 09 Location: Outpatient facility Ref. Prov.: Como Eric, MD       Interventional Therapy   Procedure: Lumbar Facet, Medial Branch Radiofrequency Ablation (RFA) #4  Laterality: Left (-LT)  Level: L2, L3, L4, L5, and S1 Medial Branch Level(s). These levels will denervate the L3-4, L4-5, and L5-S1 lumbar facet joints.  Imaging: Fluoroscopy-guided Spinal ([id number]) Anesthesia: Local anesthesia (1-2% Lidocaine ) Anxiolysis: IV Versed  3.0 mg Sedation: Minimal Sedation Fentanyl  1 mL (50 mcg) DOS: 05/30/2024  Performed by: Eric DELENA Como, MD  Purpose: Therapeutic/Palliative Indications: Low back pain severe enough to impact quality of life or function. Indications: 1. Chronic low back pain (1ry area of Pain) (Bilateral) (R>L) w/o sciatica   2. Low back pain of over 3 months duration   3. Lumbar facet joint pain   4. Lumbosacral Anterolisthesis of L5/S1 (Dynamic) (N/E:56mm  F:71mm)   5. Lumbar facet hypertrophy (Multilevel) (Bilateral)   6. L5-S1 pars defect w/ spondylolisthesis (Bilateral)   7. Lumbar facet syndrome (Bilateral)   8. Lumbar pars defect (L5-S1) (Bilateral)   9. Spondylosis without myelopathy or radiculopathy, lumbosacral region    [name] has been dealing with the above chronic  pain for longer than three months and has either failed to respond, was unable to tolerate, or simply did not get enough benefit from other more conservative therapies including, but not limited to: 1. Over-the-counter medications 2. Anti-inflammatory medications 3. Muscle relaxants 4. Membrane stabilizers 5. Opioids 6. Physical therapy and/or chiropractic manipulation 7. Modalities (Heat, ice, etc.) 8. Invasive techniques such as nerve blocks. Denise Burns has attained more than 50% relief of the pain from a series of diagnostic injections conducted in separate occasions.  Pain Score: Pre-procedure: 5 /10 Post-procedure: 0-No pain/10     Position / Prep / Materials:  Position: Prone  Prep solution: ChloraPrep (2% chlorhexidine  gluconate and 70% isopropyl alcohol) Prep Area: Entire Lumbosacral Region (Lower back from mid-thoracic region to end of tailbone and from flank to flank.) Materials:  Tray: RFA (Radiofrequency) tray Needle(s):  Type: RFA (Teflon-coated radiofrequency ablation needles) Gauge (G): 22  Length: Regular (10cm) Qty: 5     Post-Procedure Evaluation   Procedure: Lumbar Facet, Medial Branch Radiofrequency Ablation (RFA) #4  Laterality: Right (-RT)  Level: L2, L3, L4, L5, and S1 Medial Branch Level(s). These levels will denervate the L3-4, L4-5, and L5-S1 lumbar facet joints.  Imaging: Fluoroscopy-guided Spinal ([id number]) Anesthesia: Local anesthesia (1-2% Lidocaine ) Anxiolysis: IV Versed  3.0 mg Sedation: Minimal Sedation Fentanyl  2.0 mL (100 mcg) DOS: 04/23/2024  Performed by: Eric DELENA Como, MD  Purpose: Therapeutic/Palliative Indications: Low back pain severe enough to impact quality of life or function. Indications: 1. Chronic low back pain (1ry area of Pain) (Bilateral) (R>L) w/o sciatica   2. Low back pain of over 3 months duration   3. Lumbar facet joint pain   4. Lumbosacral Anterolisthesis of L5/S1 (Dynamic) (N/E:21mm  F:72mm)   5. Lumbar  facet hypertrophy (Multilevel) (Bilateral)   6. L5-S1 pars defect w/ spondylolisthesis (Bilateral)  7. Lumbar facet syndrome (Bilateral)   8. Lumbar pars defect (L5-S1) (Bilateral)   9. Spondylosis without myelopathy or radiculopathy, lumbosacral region    Pain Score: Pre-procedure: 4 /10 Post-procedure: 0-No pain/10    Effectiveness:  Initial hour after procedure: 100 %. Subsequent 4-6 hours post-procedure: 100 %. Analgesia past initial 6 hours: 75 %. Ongoing improvement:  Analgesic: The patient indicates having attained 100% relief of the pain for the duration of the local anesthetic after the radiofrequency ablation on the right side.  She also indicates that once the local anesthetic wore off the benefit decreased to an ongoing 75% improvement of her right sided low back pain. Function: Denise Burns reports improvement in function ROM: Denise Burns reports improvement in ROM   H&P (Pre-op Assessment):  Denise Burns is a 53 y.o. (year old), female patient, seen today for interventional treatment. She  has a past surgical history that includes Cholecystectomy; Polypectomy; dental work; Dilation and curettage of uterus (2010); IR NEPHROSTOMY PLACEMENT LEFT (12/14/2020); Nephrolithotomy (Left, 12/14/2020); Wisdom tooth extraction; Diagnostic laparoscopy (2004); Cystoscopy/ureteroscopy/holmium laser/stent placement (Right, 06/28/2021); Cystoscopy w/ ureteral stent removal (Right, 08/09/2021); Cystoscopy w/ retrogrades (Right, 08/09/2021); Cystoscopy/ureteroscopy/holmium laser/stent placement (Right, 08/09/2021); and Colonoscopy with propofol  (N/A, 06/08/2022). Denise Burns has a current medication list which includes the following prescription(s): azelastine  hcl, cetirizine, escitalopram , fludrocortisone , flonase  sensimist, hydrocodone -acetaminophen , hydrocodone -acetaminophen , hydrocodone -acetaminophen , hydrocortisone , lansoprazole , meloxicam , urogesic-blue, naloxone , omega-3 acid ethyl esters,  oxycodone -acetaminophen , [START ON 06/06/2024] oxycodone -acetaminophen , pseudoephedrine , rosuvastatin , and tizanidine , and the following Facility-Administered Medications: fentanyl . Her primarily concern today is the Back Pain (Left, lower)  Initial Vital Signs:  Pulse/HCG Rate: 87ECG Heart Rate: 70 (NSR) Temp: (!) 97.3 F (36.3 C) Resp: 16 BP: (!) 129/93 SpO2: 99 %  BMI: Estimated body mass index is 30.04 kg/m as calculated from the following:   Height as of this encounter: 5' 4 (1.626 m).   Weight as of this encounter: 175 lb (79.4 kg).  Risk Assessment: Allergies: Reviewed. She is allergic to tramadol, lisinopril, and sulfa antibiotics.  Allergy Precautions: None required Coagulopathies: Reviewed. None identified.  Blood-thinner therapy: None at this time Active Infection(s): Reviewed. None identified. Ms. Teague is afebrile  Site Confirmation: Ms. Peloso was asked to confirm the procedure and laterality before marking the site Procedure checklist: Completed Consent: Before the procedure and under the influence of no sedative(s), amnesic(s), or anxiolytics, the patient was informed of the treatment options, risks and possible complications. To fulfill our ethical and legal obligations, as recommended by the American Medical Association's Code of Ethics, I have informed the patient of my clinical impression; the nature and purpose of the treatment or procedure; the risks, benefits, and possible complications of the intervention; the alternatives, including doing nothing; the risk(s) and benefit(s) of the alternative treatment(s) or procedure(s); and the risk(s) and benefit(s) of doing nothing. The patient was provided information about the general risks and possible complications associated with the procedure. These may include, but are not limited to: failure to achieve desired goals, infection, bleeding, organ or nerve damage, allergic reactions, paralysis, and death. In addition,  the patient was informed of those risks and complications associated to Spine-related procedures, such as failure to decrease pain; infection (i.e.: Meningitis, epidural or intraspinal abscess); bleeding (i.e.: epidural hematoma, subarachnoid hemorrhage, or any other type of intraspinal or peri-dural bleeding); organ or nerve damage (i.e.: Any type of peripheral nerve, nerve root, or spinal cord injury) with subsequent damage to sensory, motor, and/or autonomic systems, resulting in permanent pain, numbness, and/or weakness of one or several  areas of the body; allergic reactions; (i.e.: anaphylactic reaction); and/or death. Furthermore, the patient was informed of those risks and complications associated with the medications. These include, but are not limited to: allergic reactions (i.e.: anaphylactic or anaphylactoid reaction(s)); adrenal axis suppression; blood sugar elevation that in diabetics may result in ketoacidosis or comma; water retention that in patients with history of congestive heart failure may result in shortness of breath, pulmonary edema, and decompensation with resultant heart failure; weight gain; swelling or edema; medication-induced neural toxicity; particulate matter embolism and blood vessel occlusion with resultant organ, and/or nervous system infarction; and/or aseptic necrosis of one or more joints. Finally, the patient was informed that Medicine is not an exact science; therefore, there is also the possibility of unforeseen or unpredictable risks and/or possible complications that may result in a catastrophic outcome. The patient indicated having understood very clearly. We have given the patient no guarantees and we have made no promises. Enough time was given to the patient to ask questions, all of which were answered to the patient's satisfaction. Ms. Laboy has indicated that she wanted to continue with the procedure. Attestation: I, the ordering provider, attest that I have  discussed with the patient the benefits, risks, side-effects, alternatives, likelihood of achieving goals, and potential problems during recovery for the procedure that I have provided informed consent. Date  Time: 05/30/2024  8:05 AM  Pre-Procedure Preparation:  Monitoring: As per clinic protocol. Respiration, ETCO2, SpO2, BP, heart rate and rhythm monitor placed and checked for adequate function Safety Precautions: Patient was assessed for positional comfort and pressure points before starting the procedure. Time-out: I initiated and conducted the Time-out before starting the procedure, as per protocol. The patient was asked to participate by confirming the accuracy of the Time Out information. Verification of the correct person, site, and procedure were performed and confirmed by me, the nursing staff, and the patient. Time-out conducted as per Joint Commission's Universal Protocol (UP.01.01.01). Time: 0837 Start Time: 0837 hrs.  Description of Procedure:          Laterality: See above. Levels:  See above. Safety Precautions: Aspiration looking for blood return was conducted prior to all injections. At no point did we inject any substances, as a needle was being advanced. Before injecting, the patient was told to immediately notify me if she was experiencing any new onset of ringing in the ears, or metallic taste in the mouth. No attempts were made at seeking any paresthesias. Safe injection practices and needle disposal techniques used. Medications properly checked for expiration dates. SDV (single dose vial) medications used. After the completion of the procedure, all disposable equipment used was discarded in the proper designated medical waste containers. Local Anesthesia: Protocol guidelines were followed. The patient was positioned over the fluoroscopy table. The area was prepped in the usual manner. The time-out was completed. The target area was identified using fluoroscopy. A 12-in  long, straight, sterile hemostat was used with fluoroscopic guidance to locate the targets for each level blocked. Once located, the skin was marked with an approved surgical skin marker. Once all sites were marked, the skin (epidermis, dermis, and hypodermis), as well as deeper tissues (fat, connective tissue and muscle) were infiltrated with a small amount of a short-acting local anesthetic, loaded on a 10cc syringe with a 25G, 1.5-in  Needle. An appropriate amount of time was allowed for local anesthetics to take effect before proceeding to the next step. Technical description of process:  Radiofrequency Ablation (RFA) L2 Medial Branch  Nerve RFA: The target area for the L2 medial branch is at the junction of the postero-lateral aspect of the superior articular process and the superior, posterior, and medial edge of the transverse process of L3. Under fluoroscopic guidance, a Radiofrequency needle was inserted until contact was made with os over the superior postero-lateral aspect of the pedicular shadow (target area). Sensory and motor testing was conducted to properly adjust the position of the needle. Once satisfactory placement of the needle was achieved, the numbing solution was slowly injected after negative aspiration for blood. 2.0 mL of the nerve block solution was injected without difficulty or complication. After waiting for at least 3 minutes, the ablation was performed. Once completed, the needle was removed intact. L3 Medial Branch Nerve RFA: The target area for the L3 medial branch is at the junction of the postero-lateral aspect of the superior articular process and the superior, posterior, and medial edge of the transverse process of L4. Under fluoroscopic guidance, a Radiofrequency needle was inserted until contact was made with os over the superior postero-lateral aspect of the pedicular shadow (target area). Sensory and motor testing was conducted to properly adjust the position of the  needle. Once satisfactory placement of the needle was achieved, the numbing solution was slowly injected after negative aspiration for blood. 2.0 mL of the nerve block solution was injected without difficulty or complication. After waiting for at least 3 minutes, the ablation was performed. Once completed, the needle was removed intact. L4 Medial Branch Nerve RFA: The target area for the L4 medial branch is at the junction of the postero-lateral aspect of the superior articular process and the superior, posterior, and medial edge of the transverse process of L5. Under fluoroscopic guidance, a Radiofrequency needle was inserted until contact was made with os over the superior postero-lateral aspect of the pedicular shadow (target area). Sensory and motor testing was conducted to properly adjust the position of the needle. Once satisfactory placement of the needle was achieved, the numbing solution was slowly injected after negative aspiration for blood. 2.0 mL of the nerve block solution was injected without difficulty or complication. After waiting for at least 3 minutes, the ablation was performed. Once completed, the needle was removed intact. L5 Medial Branch Nerve RFA: The target area for the L5 medial branch is at the junction of the postero-lateral aspect of the superior articular process of S1 and the superior, posterior, and medial edge of the sacral ala. Under fluoroscopic guidance, a Radiofrequency needle was inserted until contact was made with os over the superior postero-lateral aspect of the pedicular shadow (target area). Sensory and motor testing was conducted to properly adjust the position of the needle. Once satisfactory placement of the needle was achieved, the numbing solution was slowly injected after negative aspiration for blood. 2.0 mL of the nerve block solution was injected without difficulty or complication. After waiting for at least 3 minutes, the ablation was performed. Once  completed, the needle was removed intact. S1 Medial Branch Nerve RFA: The target area for the S1 medial branch is located inferior to the junction of the S1 superior articular process and the L5 inferior articular process, posterior, inferior, and lateral to the 6 o'clock position of the L5-S1 facet joint, just superior to the S1 posterior foramen. Under fluoroscopic guidance, the Radiofrequency needle was advanced until contact was made with os over the Target area. Sensory and motor testing was conducted to properly adjust the position of the needle. Once satisfactory placement of the  needle was achieved, the numbing solution was slowly injected after negative aspiration for blood. 2.0 mL of the nerve block solution was injected without difficulty or complication. After waiting for at least 3 minutes, the ablation was performed. Once completed, the needle was removed intact. Radiofrequency lesioning (ablation):  Radiofrequency Generator: Medtronic AccurianTM AG 1000 RF Generator Sensory Stimulation Parameters: 50 Hz was used to locate & identify the nerve, making sure that the needle was positioned such that there was no sensory stimulation below 0.3 V or above 0.7 V. Motor Stimulation Parameters: 2 Hz was used to evaluate the motor component. Care was taken not to lesion any nerves that demonstrated motor stimulation of the lower extremities at an output of less than 2.5 times that of the sensory threshold, or a maximum of 2.0 V. Lesioning Technique Parameters: Standard Radiofrequency settings. (Not bipolar or pulsed.) Temperature Settings: 80 degrees C Lesioning time: 60 seconds Stationary intra-operative compliance: Compliant  Once the entire procedure was completed, the treated area was cleaned, making sure to leave some of the prepping solution back to take advantage of its long term bactericidal properties.    Illustration of the posterior view of the lumbar spine and the posterior neural  structures. Laminae of L2 through S1 are labeled. DPRL5, dorsal primary ramus of L5; DPRS1, dorsal primary ramus of S1; DPR3, dorsal primary ramus of L3; FJ, facet (zygapophyseal) joint L3-L4; I, inferior articular process of L4; LB1, lateral branch of dorsal primary ramus of L1; IAB, inferior articular branches from L3 medial branch (supplies L4-L5 facet joint); IBP, intermediate branch plexus; MB3, medial branch of dorsal primary ramus of L3; NR3, third lumbar nerve root; S, superior articular process of L5; SAB, superior articular branches from L4 (supplies L4-5 facet joint also); TP3, transverse process of L3.  Facet Joint Innervation (* possible contribution)  L1-2 T12, L1 (L2*)  Medial Branch  L2-3 L1, L2 (L3*)                     L3-4 L2, L3 (L4*)                     L4-5 L3, L4 (L5*)                     L5-S1 L4, L5, S1                        Vitals:   05/30/24 0932 05/30/24 0936 05/30/24 0937 05/30/24 0943  BP: (!) 147/77   (!) 151/84  Pulse:      Resp: 15     Temp:    98.1 F (36.7 C)  TempSrc:    Temporal  SpO2: 100% (!) 89% 98% 95%  Weight:      Height:        Start Time: 0837 hrs. End Time: 0913 hrs.  Imaging Guidance (Spinal):         Type of Imaging Technique: Fluoroscopy Guidance (Spinal) Indication(s): Fluoroscopy guidance for needle placement to enhance accuracy in procedures requiring precise needle localization for targeted delivery of medication in or near specific anatomical locations not easily accessible without such real-time imaging assistance. Exposure Time: Please see nurses notes. Contrast: None used. Fluoroscopic Guidance: I was personally present during the use of fluoroscopy. Tunnel Vision Technique used to obtain the best possible view of the target area. Parallax error corrected before commencing the procedure. Direction-depth-direction technique used to introduce the needle under  continuous pulsed fluoroscopy. Once target was reached,  antero-posterior, oblique, and lateral fluoroscopic projection used confirm needle placement in all planes. Images permanently stored in EMR. Interpretation: No contrast injected. I personally interpreted the imaging intraoperatively. Adequate needle placement confirmed in multiple planes. Permanent images saved into the patient's record.  Antibiotic Prophylaxis:   Anti-infectives (From admission, onward)    None      Indication(s): None identified  Post-operative Assessment:  Post-procedure Vital Signs:  Pulse/HCG Rate: 8769 Temp: 98.1 F (36.7 C) Resp: 15 BP: (!) 151/84 SpO2: 95 %  EBL: None  Complications: No immediate post-treatment complications observed by team, or reported by patient.  Note: The patient tolerated the entire procedure well. A repeat set of vitals were taken after the procedure and the patient was kept under observation following institutional policy, for this type of procedure. Post-procedural neurological assessment was performed, showing return to baseline, prior to discharge. The patient was provided with post-procedure discharge instructions, including a section on how to identify potential problems. Should any problems arise concerning this procedure, the patient was given instructions to immediately contact us , at any time, without hesitation. In any case, we plan to contact the patient by telephone for a follow-up status report regarding this interventional procedure.  Comments:  No additional relevant information.  Plan of Care (POC)  Orders:  Orders Placed This Encounter  Procedures   Radiofrequency,Lumbar    Scheduling Instructions:     Side(s): Left-sided     Level: L3-4, L4-5, and L5-S1 Facets (L2, L3, L4, L5, and S1 Medial Branch)     Sedation: With Sedation.     Date: 05/30/2024    Where will this procedure be performed?:   [facility]   [facility] C-ARM 1-60 MIN NO REPORT    Intraoperative interpretation by procedural  physician at [facility].    Standing Status:   Standing    Number of Occurrences:   1    Reason for exam::   Assistance in needle guidance and placement for procedures requiring needle placement in or near specific anatomical locations not easily accessible without such assistance.   Nursing Instructions:    Please complete this patient's postprocedure evaluation.    Scheduling Instructions:     Please complete this patient's postprocedure evaluation.   Informed Consent Details: Physician/Practitioner Attestation; Transcribe to consent form and obtain patient signature    Nursing Order: Transcribe to consent form and obtain patient signature. Note: Always confirm laterality of pain with Ms. Sammie, before procedure.    Physician/Practitioner attestation of informed consent for procedure/surgical case:   I, the physician/practitioner, attest that I have discussed with the patient the benefits, risks, side effects, alternatives, likelihood of achieving goals and potential problems during recovery for the procedure that I have provided informed consent.    Procedure:   Lumbar Facet Radiofrequency Ablation    Physician/Practitioner performing the procedure:   Dnyla Antonetti A. Tanya, MD    Indication/Reason:   Low Back Pain, with our without leg pain, due to Facet Joint Arthralgia (Joint Pain) known as Lumbar Facet Syndrome, secondary to Lumbar, and/or Lumbosacral Spondylosis (Arthritis of the Spine), without myelopathy or radiculopathy (Nerve Damage).   Provide equipment / supplies at bedside    Procedure tray: Radiofrequency Tray Additional material: Large hemostat (x1); Small hemostat (x1); Towels (x8); 4x4 sterile sponge pack (x1) Needle type: Teflon-coated Radiofrequency Needle (Disposable  single use) Size: Regular Quantity: 5    Standing Status:   Standing  Number of Occurrences:   1    Specify:   Radiofrequency Tray   Saline lock IV    Have LR [phone] mL available and  administer at 125 mL/hr if patient becomes hypotensive.    Standing Status:   Standing    Number of Occurrences:   1     Opioid Analgesic(s): Hydrocodone /APAP 7.5/325 1 tablet p.o. 3 times daily (22.5 mg/day of hydrocodone )  MME/day: 22.5 mg/day.    Medications ordered for procedure: Meds ordered this encounter  Medications   lidocaine  (XYLOCAINE ) 2 % (with pres) injection 400 mg   pentafluoroprop-tetrafluoroeth (GEBAUERS) aerosol   midazolam  (VERSED ) 5 MG/5ML injection 0.5-2 mg    Make sure Flumazenil is available in the pyxis when using this medication. If oversedation occurs, administer 0.2 mg IV over 15 sec. If after 45 sec no response, administer 0.2 mg again over 1 min; may repeat at 1 min intervals; not to exceed 4 doses (1 mg)   fentaNYL  (SUBLIMAZE ) injection 25-50 mcg    Make sure Narcan  is available in the pyxis when using this medication. In the event of respiratory depression (RR< 8/min): Titrate NARCAN  (naloxone ) in increments of 0.1 to 0.2 mg IV at 2-3 minute intervals, until desired degree of reversal.   ropivacaine  (PF) 2 mg/mL (0.2%) (NAROPIN ) injection 9 mL   triamcinolone  acetonide (KENALOG -40) injection 40 mg   oxyCODONE -acetaminophen  (PERCOCET) 5-325 MG tablet    Sig: Take 1 tablet by mouth every 8 (eight) hours as needed for up to 7 days for severe pain (pain score 7-10). Must last 7 days.    Dispense:  21 tablet    Refill:  0    For acute post-operative pain. Not to be refilled.  Must last 7 days.   oxyCODONE -acetaminophen  (PERCOCET) 5-325 MG tablet    Sig: Take 1 tablet by mouth every 8 (eight) hours as needed for up to 7 days for severe pain (pain score 7-10). Must last 7 days.    Dispense:  21 tablet    Refill:  0    For acute post-operative pain. Not to be refilled.  Must last 7 days.   Medications administered: We administered lidocaine , pentafluoroprop-tetrafluoroeth, midazolam , fentaNYL , ropivacaine  (PF) 2 mg/mL (0.2%), and triamcinolone  acetonide.  See  the medical record for exact dosing, route, and time of administration.    Interventional Therapies  Risk  Complexity Considerations:   WNL   Planned  Pending:   Therapeutic bilateral lumbar facet MB RFA #4 (Starting on right side)   Under consideration:   Possible spinal cord stimulator trial (information about device provided on 02/27/2024)  Pending evaluation by Dr. Katrina for neurosurgical alternatives. Therapeutic right L2-3 LESI #3  Therapeutic bilateral lumbar facet MB RFA #4    Completed:   Diagnostic right L2-3 LESI x2 (01/23/2024) (100/100/60/60-100)  Therapeutic bilateral IA hip + bilateral trochanteric bursa inj. R5L4 (02/13/2024) (100/100/75/75)  Therapeutic bilateral lumbar facet MBB x3 (09/08/2020) (4-0) (100/100/60/60)  Therapeutic right lumbar facet RFA x3 (02/21/2023) (4-4) (100/100/80/80)  Therapeutic left lumbar facet RFA x3 (03/09/2023) (3-0) (100/100/90/80-90%)  Therapeutic right IA hip inj. x2 (08/20/2020) (5-1) (100/100/80/80)  Therapeutic left IA hip inj. x1 (08/20/2020) (5-1) (100/100/80/80)  Therapeutic bilateral trochanteric bursa inj. x3 (04/07/2022)  (100/100/90/90)  Neurosurgery consult for evaluation of dynamic lumbar spondylosis and possible surgical alternatives (09/20/2023)   Therapeutic  Palliative (PRN) options:   Palliative lumbar facet RFA  Palliative Hip & trochanteric bursa inj.    Pharmacotherapy  Nonopioids transferred 05/11/2020: Baclofen  and Mobic   Follow-up plan:   Return in about 6 weeks (around 07/11/2024) for (Face2F), (PPE) s/p RFA.     Recent Visits Date Type Provider Dept  04/23/24 Procedure visit Tanya Glisson, MD [facility]  03/20/24 Office Visit Tanya Glisson, MD [facility]  03/19/24 Office Visit Patel, Seema K, NP [facility]  Showing recent visits within past 90 days and meeting all other requirements Today's Visits Date Type Provider Dept  05/30/24 Procedure visit  Tanya Glisson, MD [facility]  Showing today's visits and meeting all other requirements Future Appointments Date Type Provider Dept  06/10/24 Appointment Patel, Seema K, NP [facility]  07/10/24 Appointment Tanya Glisson, MD [facility]  Showing future appointments within next 90 days and meeting all other requirements   Disposition: Discharge home  Discharge (Date  Time): 05/30/2024; 0945 hrs.   Primary Care Physician: Chandra Toribio POUR, MD Location: [facility] Note by: Glisson DELENA Tanya, MD (TTS technology used. I apologize for any typographical errors that were not detected and corrected.) Date: 05/30/2024; Time: 9:47 AM  Disclaimer:  Medicine is not an visual merchandiser. The only guarantee in medicine is that nothing is guaranteed. It is important to note that the decision to proceed with this intervention was based on the information collected from the patient. The Data and conclusions were drawn from the patient's questionnaire, the interview, and the physical examination. Because the information was provided in large part by the patient, it cannot be guaranteed that it has not been purposely or unconsciously manipulated. Every effort has been made to obtain as much relevant data as possible for this evaluation. It is important to note that the conclusions that lead to this procedure are derived in large part from the available data. Always take into account that the treatment will also be dependent on availability of resources and existing treatment guidelines, considered by other Pain Management Practitioners as being common knowledge and practice, at the time of the intervention. For Medico-Legal purposes, it is also important to point out that variation in procedural techniques and pharmacological choices are the acceptable norm. The indications, contraindications, technique, and results of the above procedure should only  be interpreted and judged by a Board-Certified Interventional Pain Specialist with extensive familiarity and expertise in the same exact procedure and technique.

## 2024-05-30 NOTE — Patient Instructions (Signed)
 ______________________________________________________________________    Post-Radiofrequency (RF) Discharge Instructions  You have just completed a Radiofrequency Neurotomy.  The following instructions will provide you with information and guidelines for self-care upon discharge.  If at any time you have questions or concerns please call your physician. DO NOT DRIVE YOURSELF!!  Instructions: Apply ice: Fill a plastic sandwich bag with crushed ice. Cover it with a small towel and apply to injection site. Apply for 15 minutes then remove x 15 minutes. Repeat sequence on day of procedure, until you go to bed. The purpose is to minimize swelling and discomfort after procedure. Apply heat: Apply heat to procedure site starting the day following the procedure. The purpose is to treat any soreness and discomfort from the procedure. Food intake: No eating limitations, unless stipulated above.  Nevertheless, if you have had sedation, you may experience some nausea.  In this case, it may be wise to wait at least two hours prior to resuming regular diet. Physical activities: Keep activities to a minimum for the first 8 hours after the procedure. For the first 24 hours after the procedure, do not drive a motor vehicle,  Operate heavy machinery, power tools, or handle any weapons.  Consider walking with the use of an assistive device or accompanied by an adult for the first 24 hours.  Do not drink alcoholic beverages including beer.  Do not make any important decisions or sign any legal documents. Go home and rest today.  Resume activities tomorrow, as tolerated.  Use caution in moving about as you may experience mild leg weakness.  Use caution in cooking, use of household electrical appliances and climbing steps. Driving: If you have received any sedation, you are not allowed to drive for 24 hours after your procedure. Blood thinner: Restart your blood thinner 6 hours after your procedure. (Only for those taking  blood thinners) Insulin: As soon as you can eat, you may resume your normal dosing schedule. (Only for those taking insulin) Medications: May resume pre-procedure medications.  Do not take any drugs, other than what has been prescribed to you. Infection prevention: Keep procedure site clean and dry. Post-procedure Pain Diary: Extremely important that this be done correctly and accurately. Recorded information will be used to determine the next step in treatment. Pain evaluated is that of treated area only. Do not include pain from an untreated area. Complete every hour, on the hour, for the initial 8 hours. Set an alarm to help you do this part accurately. Do not go to sleep and have it completed later. It will not be accurate. Follow-up appointment: Keep your follow-up appointment after the procedure. Usually 2-6 weeks after radiofrequency. Bring you pain diary. The information collected will be essential for your long-term care.   Expect: From numbing medicine (AKA: Local Anesthetics): Numbness or decrease in pain. Onset: Full effect within 15 minutes of injected. Duration: It will depend on the type of local anesthetic used. On the average, 1 to 8 hours.  From steroids (when added): Decrease in swelling or inflammation. Once inflammation is improved, relief of the pain will follow. Onset of benefits: Depends on the amount of swelling present. The more swelling, the longer it will take for the benefits to be seen. In some cases, up to 10 days. Duration: Steroids will stay in the system x 2 weeks. Duration of benefits will depend on multiple posibilities including persistent irritating factors. From procedure: Some discomfort is to be expected once the numbing medicine wears off. In the case of  radiofrequency procedures, this may last as long as 6-8 weeks. Additional post-procedure pain medication is provided for this. Discomfort is minimized if ice and heat are applied as instructed.  Call  if: You experience numbness and weakness that gets worse with time, as opposed to wearing off. He experience any unusual bleeding, difficulty breathing, or loss of the ability to control your bowel and bladder. (This applies to Spinal procedures only) You experience any redness, swelling, heat, red streaks, elevated temperature, fever, or any other signs of a possible infection.  Emergency Numbers: Durning business hours (Monday - Thursday, 8:00 AM - 4:00 PM) (Friday, 9:00 AM - 12:00 Noon): [phone] After hours: [phone] ______________________________________________________________________     ______________________________________________________________________    Steroid injections  Common steroids for injections Triamcinolone : Used by many sports medicine physicians for large joint and bursal injections, often combined with a local anesthetic like lidocaine . A study focusing on coccydynia (tailbone pain) found triamcinolone  was more effective than betamethasone, suggesting it may also be preferable for other localized inflammation conditions. Methylprednisolone : A common alternative to triamcinolone  that is also a strong anti-inflammatory. It is available in different formulations, with the acetate suspension being the long-acting option for intra-articular injections. Dexamethasone : This is a non-particulate steroid, meaning it has a lower risk of tissue damage compared to particulate steroids like triamcinolone  and methylprednisolone . While less common for this specific use, it is an option for targeted injections.   Considerations for physicians Particulate vs. non-particulate steroids: Triamcinolone  and methylprednisolone  are particulate, meaning they can clump together. Dexamethasone  is non-particulate. Particulate steroids are often preferred for their longer-lasting effects but carry a theoretical higher risk for certain injections (though this is less of a concern in the  costochondral joints). Combined injectate: Corticosteroids are typically mixed with a local anesthetic like lidocaine  to provide both immediate pain relief (from the anesthetic) and longer-term inflammation reduction (from the steroid). Imaging guidance: To ensure accurate placement of the needle and medication, physicians may use ultrasound or fluoroscopic guidance for the injection, especially in complex or refractory cases.   Patient guidance Before undergoing a steroid injection, discuss the options with your physician. They will determine the best steroid, dosage, and procedure for your specific case based on factors like: Severity of your condition History of response to other treatments Your overall health status Experience and preference of the physician  Last  Updated: 03/26/2024 ______________________________________________________________________

## 2024-05-31 ENCOUNTER — Telehealth: Payer: Self-pay

## 2024-05-31 NOTE — Telephone Encounter (Signed)
 Post procedure follow up.  LM

## 2024-06-10 ENCOUNTER — Encounter: Admitting: Nurse Practitioner

## 2024-06-11 ENCOUNTER — Encounter: Payer: Self-pay | Admitting: Nurse Practitioner

## 2024-06-11 ENCOUNTER — Other Ambulatory Visit: Payer: Self-pay

## 2024-06-11 ENCOUNTER — Ambulatory Visit: Attending: Nurse Practitioner | Admitting: Nurse Practitioner

## 2024-06-11 DIAGNOSIS — Z79899 Other long term (current) drug therapy: Secondary | ICD-10-CM | POA: Diagnosis not present

## 2024-06-11 DIAGNOSIS — M533 Sacrococcygeal disorders, not elsewhere classified: Secondary | ICD-10-CM | POA: Diagnosis not present

## 2024-06-11 DIAGNOSIS — M545 Low back pain, unspecified: Secondary | ICD-10-CM | POA: Insufficient documentation

## 2024-06-11 DIAGNOSIS — G894 Chronic pain syndrome: Secondary | ICD-10-CM | POA: Insufficient documentation

## 2024-06-11 DIAGNOSIS — G8929 Other chronic pain: Secondary | ICD-10-CM | POA: Insufficient documentation

## 2024-06-11 DIAGNOSIS — M47816 Spondylosis without myelopathy or radiculopathy, lumbar region: Secondary | ICD-10-CM | POA: Diagnosis not present

## 2024-06-11 DIAGNOSIS — M25551 Pain in right hip: Secondary | ICD-10-CM | POA: Diagnosis not present

## 2024-06-11 DIAGNOSIS — Z79891 Long term (current) use of opiate analgesic: Secondary | ICD-10-CM | POA: Insufficient documentation

## 2024-06-11 DIAGNOSIS — M25552 Pain in left hip: Secondary | ICD-10-CM | POA: Insufficient documentation

## 2024-06-11 MED ORDER — HYDROCODONE-ACETAMINOPHEN 7.5-325 MG PO TABS
1.0000 | ORAL_TABLET | Freq: Three times a day (TID) | ORAL | 0 refills | Status: AC | PRN
  Filled 2024-07-21: qty 90, 30d supply, fill #0

## 2024-06-11 MED ORDER — HYDROCODONE-ACETAMINOPHEN 7.5-325 MG PO TABS
1.0000 | ORAL_TABLET | Freq: Three times a day (TID) | ORAL | 0 refills | Status: AC | PRN
  Filled 2024-08-20: qty 90, 30d supply, fill #0

## 2024-06-11 MED ORDER — HYDROCODONE-ACETAMINOPHEN 7.5-325 MG PO TABS
1.0000 | ORAL_TABLET | Freq: Three times a day (TID) | ORAL | 0 refills | Status: AC | PRN
  Filled 2024-06-21: qty 90, 30d supply, fill #0

## 2024-06-11 NOTE — Progress Notes (Signed)
 Nursing Pain Medication Assessment:  Safety precautions to be maintained throughout the outpatient stay will include: orient to surroundings, keep bed in low position, maintain call bell within reach at all times, provide assistance with transfer out of bed and ambulation.  Medication Inspection Compliance: Pill count conducted under aseptic conditions, in front of the patient. Neither the pills nor the bottle was removed from the patient's sight at any time. Once count was completed pills were immediately returned to the patient in their original bottle.  Medication: Hydrocodone /APAP Pill/Patch Count: 22.5 of 90 pills/patches remain Pill/Patch Appearance: Markings consistent with prescribed medication Bottle Appearance: Standard pharmacy container. Clearly labeled. Filled Date: 65 / 22 / 2025 Last Medication intake:  Today

## 2024-06-11 NOTE — Progress Notes (Signed)
 PROVIDER NOTE: Interpretation of information contained herein should be left to medically-trained personnel. Specific patient instructions are provided elsewhere under Patient Instructions section of medical record. This document was created in part using AI and STT-dictation technology, any transcriptional errors that may result from this process are unintentional.  Patient: Denise Burns  Service: E/M   PCP: Denise Toribio POUR, MD  DOB: 20-May-1971  DOS: 06/11/2024  Provider: Emmy Burns Blanch, NP  MRN: [id number]  Delivery: Face-to-face  Specialty: Interventional Pain Management  Type: Established Patient  Setting: Ambulatory outpatient facility  Specialty designation: 09  Referring Prov.: Denise Toribio POUR, MD  Location: Outpatient office facility       History of present illness (HPI) Denise Burns, a 53 y.o. year old female, is here today because of her Bilateral hip pain. Denise Burns's primary complain today is Hip Pain (Bilateral )  Pertinent problems: Denise Burns has  has Chronic midline low back pain with bilateral sciatica; Chronic low back pain(1ry area of pain) (Bilateral) (R>L) w/o sciatica; Lumbar facet hypertrophy (multilevel) (Bilateral); Lumbar facet syndrome; Chronic musculoskeletal pain; Neurogenic pain; Greater trochanteric bursitis (Bilateral); Chronic hip pain (Right); Osteoarthritis of hip (Right); Chronic hip pain (Bilateral); and Chronic pain syndrome on their pertinent problem list.   Pain Assessment: Severity of Chronic pain is reported as a 3 /10. Location: Hip Right, Left/Radiates down left leg to calf. Onset: More than a month ago. Quality: Aching, Shooting. Timing: Constant. Modifying factor(s): Rest ,Heat, Cold, Medication. Vitals:  height is 5' 4 (1.626 m) and weight is 175 lb (79.4 kg). Her temporal temperature is 97.1 F (36.2 C) (abnormal). Her blood pressure is 124/88 and her pulse is 91. Her oxygen saturation is 98%.  BMI: Estimated body mass index is  30.04 kg/m as calculated from the following:   Height as of this encounter: 5' 4 (1.626 m).   Weight as of this encounter: 175 lb (79.4 kg).  Last encounter: 03/19/2024. Last procedure: Visit date not found.  Reason for encounter: medication management. The patient reports doing well on the current medication regimen, with no adverse reaction or side effects reported to medication.   Discussed the use of AI scribe software for clinical note transcription with the patient, who gave verbal consent to proceed.  History of Present Illness   Denise Burns is a 53 year old female who presents with bilateral hip pain. She experiences bilateral hip pain, particularly when lying on her side, with the pain radiating down her leg and localized around the trochanter area. She previously received an injection in the trochanter area, which provided some relief.  She recently underwent lumbar radiofrequency ablation on May 30, 2024, and it has been a few months since her last hip procedure, which was around February 13, 2024.  She is currently taking Norco for pain management, with no reported side effects or adverse reactions.     Pharmacotherapy Assessment   Hydrocodone -acetaminophen  (Norco) 7.5-325 mg tablet every 8 hours as needed for severe pain. MME=22.50  Monitoring: [place] PMP: PDMP reviewed during this encounter.       Pharmacotherapy: No side-effects or adverse reactions reported. Compliance: No problems identified. Effectiveness: Clinically acceptable.  Denise Burns, [place]  06/11/2024  1:42 PM  Sign when Signing Visit Nursing Pain Medication Assessment:  Safety precautions to be maintained throughout the outpatient stay will include: orient to surroundings, keep bed in low position, maintain call bell within reach at all times, provide assistance with transfer out of bed  and ambulation.  Medication Inspection Compliance: Pill count conducted under aseptic conditions, in front of the patient.  Neither the pills nor the bottle was removed from the patient's sight at any time. Once count was completed pills were immediately returned to the patient in their original bottle.  Medication: Hydrocodone /APAP Pill/Patch Count: 22.5 of 90 pills/patches remain Pill/Patch Appearance: Markings consistent with prescribed medication Bottle Appearance: Standard pharmacy container. Clearly labeled. Filled Date: 5 / 22 / 2025 Last Medication intake:  Today    UDS:  Summary  Date Value Ref Range Status  03/19/2024 FINAL  Final    Comment:    ==================================================================== [facility] (MW) ==================================================================== Test                             Result       Flag       Units  Drug Present and Declared for Prescription Verification   Hydrocodone                     389          EXPECTED   ng/mg creat   Hydromorphone                   111          EXPECTED   ng/mg creat   Dihydrocodeine                 61           EXPECTED   ng/mg creat   Norhydrocodone                 512          EXPECTED   ng/mg creat    Sources of hydrocodone  include scheduled prescription medications.    Hydromorphone , dihydrocodeine and norhydrocodone are expected    metabolites of hydrocodone . Hydromorphone  and dihydrocodeine are    also available as scheduled prescription medications.  ==================================================================== Test                      Result    Flag   Units      Ref Range   Creatinine              99               mg/dL      >=79 ==================================================================== Declared Medications:  The flagging and interpretation on this report are based on the  following declared medications.  Unexpected results may arise from  inaccuracies in the declared medications.   **Note: The testing scope of this panel includes these medications:   Hydrocodone     **Note: The testing scope of this panel does not include the  following reported medications:   Acetaminophen   Azelastine   Cetirizine (Zyrtec)  Escitalopram  (Lexapro )  Fludrocortisone  (Florinef )  Fluticasone  (Flonase )  Hydrocortisone  (Cortef )  Hyoscyamine  Lansoprazole  (Prevacid )  Meloxicam  (Mobic )  Methenamine   Methylene Blue  Naloxone  (Narcan )  Omega-3-Acid Ethyl Esters (Lovaza)  Pseudoephedrine  (Sudafed)  Rosuvastatin  (Crestor )  Sodium Phosphate   Tizanidine  ==================================================================== For clinical consultation, please call [phone]. ====================================================================     No results found for: CBDTHCR No results found for: D8THCCBX No results found for: D9THCCBX  ROS  Constitutional: Denies any fever or chills Gastrointestinal: No reported hemesis, hematochezia, vomiting, or acute GI distress Musculoskeletal: bilateral hip pain Neurological: No reported episodes of acute onset apraxia, aphasia, dysarthria, agnosia,  amnesia, paralysis, loss of coordination, or loss of consciousness  Medication Review  Azelastine  HCl, HYDROcodone -acetaminophen , Urogesic-Blue, cetirizine, escitalopram , fludrocortisone , fluticasone , hydrocortisone , lansoprazole , meloxicam , naloxone , omega-3 acid ethyl esters, oxyCODONE -acetaminophen , pseudoephedrine , rosuvastatin , and tiZANidine   History Review  Allergy: Denise Burns is allergic to tramadol, lisinopril, and sulfa antibiotics. Drug: Denise Burns  reports no history of drug use. Alcohol:  reports that she does not currently use alcohol. Tobacco:  reports that she quit smoking about 29 years ago. Her smoking use included cigarettes. She has never used smokeless tobacco. Social: Denise Burns  reports that she quit smoking about 29 years ago. Her smoking use included cigarettes. She has never used smokeless tobacco. She reports that she does not currently use  alcohol. She reports that she does not use drugs. Medical:  has a past medical history of Addison disease (HCC), Allergy, Arthritis, Back pain, Complication of anesthesia, GERD (gastroesophageal reflux disease), History of kidney stones, and Sleep apnea. Surgical: Denise Burns  has a past surgical history that includes Cholecystectomy; Polypectomy; dental work; Dilation and curettage of uterus (2010); IR NEPHROSTOMY PLACEMENT LEFT (12/14/2020); Nephrolithotomy (Left, 12/14/2020); Wisdom tooth extraction; Diagnostic laparoscopy (2004); Cystoscopy/ureteroscopy/holmium laser/stent placement (Right, 06/28/2021); Cystoscopy w/ ureteral stent removal (Right, 08/09/2021); Cystoscopy w/ retrogrades (Right, 08/09/2021); Cystoscopy/ureteroscopy/holmium laser/stent placement (Right, 08/09/2021); and Colonoscopy with propofol  (N/A, 06/08/2022). Family: family history includes Alcohol abuse in her father; Arthritis in her mother; Brain cancer in her mother; Colon polyps (age of onset: 60) in her maternal grandmother; Hearing loss in her father; High blood pressure in her father and mother; Hyperlipidemia in her father and mother; Hypertension in her mother; Liver cancer in her mother; Lung cancer in her mother.  Laboratory Chemistry Profile   Renal Lab Results  Component Value Date   BUN 18 09/12/2023   CREATININE 0.91 09/12/2023   BCR 20 09/12/2023   GFR 84.36 08/26/2021   GFRAA 86 08/16/2018   GFRNONAA >60 09/18/2022    Hepatic Lab Results  Component Value Date   AST 25 09/12/2023   ALT 31 09/12/2023   ALBUMIN 4.4 09/12/2023   ALKPHOS 68 09/12/2023   LIPASE 38 09/16/2022    Electrolytes Lab Results  Component Value Date   NA 142 09/12/2023   K 3.9 09/12/2023   CL 104 09/12/2023   CALCIUM  9.5 09/12/2023   MG 2.2 09/18/2022   PHOS 4.3 09/18/2022    Bone Lab Results  Component Value Date   VD25OH 14.0 (L) 06/15/2022   25OHVITD1 67 08/16/2018   25OHVITD2 <1.0 08/16/2018   25OHVITD3 67  08/16/2018   TESTOSTERONE  1 (L) 08/13/2020    Inflammation (CRP: Acute Phase) (ESR: Chronic Phase) Lab Results  Component Value Date   CRP 2 08/16/2018   ESRSEDRATE 37 (H) 08/16/2018   LATICACIDVEN 1.9 09/16/2022         Note: Above Lab results reviewed.  Recent Imaging Review  DG PAIN CLINIC C-ARM 1-60 MIN NO REPORT Fluoro was used, but no Radiologist interpretation will be provided.  Please refer to NOTES tab for provider progress note. Note: Reviewed        Physical Exam  Vitals: BP 124/88 (BP Location: Right Arm, Patient Position: Sitting, Cuff Size: Normal)   Pulse 91   Temp (!) 97.1 F (36.2 C) (Temporal)   Ht 5' 4 (1.626 m)   Wt 175 lb (79.4 kg)   LMP 12/01/2020 (Approximate)   SpO2 98%   BMI 30.04 kg/m  BMI: Estimated body mass index is 30.04 kg/m as calculated from the following:  Height as of this encounter: 5' 4 (1.626 m).   Weight as of this encounter: 175 lb (79.4 kg). Ideal: Ideal body weight: 54.7 kg (120 lb 9.5 oz) Adjusted ideal body weight: 64.6 kg (142 lb 5.7 oz) General appearance: Well nourished, well developed, and well hydrated. In no apparent acute distress Mental status: Alert, oriented x 3 (person, place, & time)       Respiratory: No evidence of acute respiratory distress Eyes: PERLA  Musculoskeletal: Bilateral hip pain Assessment   Diagnosis Status  1. Chronic hip pain, bilateral   2. Chronic pain syndrome   3. Encounter for medication management   4. Chronic bilateral low back pain without sciatica   5. Chronic right sacroiliac joint pain   6. Lumbar facet joint syndrome   7. Pharmacologic therapy   8. Chronic use of opiate for therapeutic purpose   9. Encounter for chronic pain management    Controlled Controlled Controlled   Updated Problems: Problem  Encounter for Medication Management    Plan of Care  Problem-specific:  Assessment and Plan    Bilateral hip pain The patient continues experiencing bilateral hip  pain radiation to leg, localized to trochanteric region.  She previously received hip and bursa injection on February 13, 2024 with 75% pain relief and functional improvement. She declined to schedule her hip injection at this visit but may consider it in the future.  Chronic pain syndrome Managed with Norco. No side effects from medication. Patient's pain is controlled with hydrocodone , will continue on current medication regimen.  Prescribing drug monitoring (PDMP) reviewed, consistent with the use of prescribed medication and no evidence of narcotic misuse or abuse.  Urine drug screening (UDS) up to date and consistent with the use of prescribed medication.  No side effects or adverse reaction reported to medication.  The patient was advised to drink more water to prevent opioid induced constipation.  Schedule follow-up in 90 days for medication management.  Encounter for medication management: Meds ordered this encounter  Medications   HYDROcodone -acetaminophen  (NORCO) 7.5-325 MG tablet    Sig: Take 1 tablet by mouth every 8 (eight) hours as needed for severe pain (pain score 7-10). Must last 30 days.    Dispense:  90 tablet    Refill:  0    DO NOT: delete (not duplicate); no partial-fill (will deny script to complete), no refill request (F/U required). DISPENSE: 1 day early if closed on fill date. WARN: No CNS-depressants within 8 hrs of med.   HYDROcodone -acetaminophen  (NORCO) 7.5-325 MG tablet    Sig: Take 1 tablet by mouth every 8 (eight) hours as needed for severe pain (pain score 7-10). Must last 30 days.    Dispense:  90 tablet    Refill:  0    DO NOT: delete (not duplicate); no partial-fill (will deny script to complete), no refill request (F/U required). DISPENSE: 1 day early if closed on fill date. WARN: No CNS-depressants within 8 hrs of med.   HYDROcodone -acetaminophen  (NORCO) 7.5-325 MG tablet    Sig: Take 1 tablet by mouth every 8 (eight) hours as needed for severe pain (pain score  7-10). Must last 30 days.    Dispense:  90 tablet    Refill:  0    DO NOT: delete (not duplicate); no partial-fill (will deny script to complete), no refill request (F/U required). DISPENSE: 1 day early if closed on fill date. WARN: No CNS-depressants within 8 hrs of med.  Denise Burns has a current medication list which includes the following long-term medication(s): azelastine  hcl, escitalopram , flonase  sensimist, lansoprazole , meloxicam , omega-3 acid ethyl esters, rosuvastatin , [START ON 06/21/2024] hydrocodone -acetaminophen , [START ON 07/21/2024] hydrocodone -acetaminophen , and [START ON 08/20/2024] hydrocodone -acetaminophen .  Pharmacotherapy (Medications Ordered): Meds ordered this encounter  Medications   HYDROcodone -acetaminophen  (NORCO) 7.5-325 MG tablet    Sig: Take 1 tablet by mouth every 8 (eight) hours as needed for severe pain (pain score 7-10). Must last 30 days.    Dispense:  90 tablet    Refill:  0    DO NOT: delete (not duplicate); no partial-fill (will deny script to complete), no refill request (F/U required). DISPENSE: 1 day early if closed on fill date. WARN: No CNS-depressants within 8 hrs of med.   HYDROcodone -acetaminophen  (NORCO) 7.5-325 MG tablet    Sig: Take 1 tablet by mouth every 8 (eight) hours as needed for severe pain (pain score 7-10). Must last 30 days.    Dispense:  90 tablet    Refill:  0    DO NOT: delete (not duplicate); no partial-fill (will deny script to complete), no refill request (F/U required). DISPENSE: 1 day early if closed on fill date. WARN: No CNS-depressants within 8 hrs of med.   HYDROcodone -acetaminophen  (NORCO) 7.5-325 MG tablet    Sig: Take 1 tablet by mouth every 8 (eight) hours as needed for severe pain (pain score 7-10). Must last 30 days.    Dispense:  90 tablet    Refill:  0    DO NOT: delete (not duplicate); no partial-fill (will deny script to complete), no refill request (F/U required). DISPENSE: 1 day early if  closed on fill date. WARN: No CNS-depressants within 8 hrs of med.   Orders:  No orders of the defined types were placed in this encounter.       Return in about 3 months (around 09/11/2024) for (F2F), (MM), Denise Blanch NP.    Recent Visits Date Type Provider Dept  05/30/24 Procedure visit Tanya Glisson, MD [facility]  04/23/24 Procedure visit Tanya Glisson, MD [facility]  03/20/24 Office Visit Tanya Glisson, MD [facility]  03/19/24 Office Visit Djuan Talton K, NP [facility]  Showing recent visits within past 90 days and meeting all other requirements Today's Visits Date Type Provider Dept  06/11/24 Office Visit Phyliss Hulick K, NP [facility]  Showing today's visits and meeting all other requirements Future Appointments Date Type Provider Dept  07/10/24 Appointment Tanya Glisson, MD [facility]  Showing future appointments within next 90 days and meeting all other requirements  I discussed the assessment and treatment plan with the patient. The patient was provided an opportunity to ask questions and all were answered. The patient agreed with the plan and demonstrated an understanding of the instructions.  Patient advised to call back or seek an in-person evaluation if the symptoms or condition worsens.  I personally spent a total of 30 minutes in the care of the patient today including preparing to see the patient, getting/reviewing separately obtained history, performing a medically appropriate exam/evaluation, counseling and educating, placing orders, referring and communicating with other health care professionals, documenting clinical information in the EHR, independently interpreting results, communicating results, and coordinating care.   Note by: Sharod Petsch K Joanne Salah, NP (TTS and AI technology used. I apologize for any typographical errors that were not detected and corrected.) Date: 06/11/2024; Time:  2:08 PM

## 2024-06-21 ENCOUNTER — Other Ambulatory Visit: Payer: Self-pay

## 2024-06-24 ENCOUNTER — Ambulatory Visit: Admitting: Family Medicine

## 2024-06-26 ENCOUNTER — Other Ambulatory Visit: Payer: Self-pay | Admitting: Family Medicine

## 2024-06-26 ENCOUNTER — Other Ambulatory Visit: Payer: Self-pay

## 2024-06-26 DIAGNOSIS — F411 Generalized anxiety disorder: Secondary | ICD-10-CM

## 2024-06-26 MED ORDER — ESCITALOPRAM OXALATE 5 MG PO TABS
5.0000 mg | ORAL_TABLET | Freq: Every day | ORAL | 1 refills | Status: AC
  Filled 2024-06-26: qty 90, 90d supply, fill #0

## 2024-07-01 ENCOUNTER — Other Ambulatory Visit

## 2024-07-10 ENCOUNTER — Ambulatory Visit: Admitting: Pain Medicine

## 2024-07-20 ENCOUNTER — Other Ambulatory Visit: Payer: Self-pay | Admitting: Family Medicine

## 2024-07-20 DIAGNOSIS — M47816 Spondylosis without myelopathy or radiculopathy, lumbar region: Secondary | ICD-10-CM

## 2024-07-21 ENCOUNTER — Other Ambulatory Visit: Payer: Self-pay

## 2024-07-22 ENCOUNTER — Other Ambulatory Visit: Payer: Self-pay

## 2024-07-22 MED ORDER — MELOXICAM 15 MG PO TABS
15.0000 mg | ORAL_TABLET | Freq: Every day | ORAL | 0 refills | Status: DC
  Filled 2024-07-22: qty 90, 90d supply, fill #0

## 2024-08-06 ENCOUNTER — Other Ambulatory Visit: Payer: Self-pay

## 2024-08-09 ENCOUNTER — Encounter: Payer: Self-pay | Admitting: Family Medicine

## 2024-08-09 ENCOUNTER — Ambulatory Visit: Admitting: Family Medicine

## 2024-08-09 VITALS — BP 126/86 | HR 105 | Temp 98.6°F | Ht 64.0 in | Wt 186.2 lb

## 2024-08-09 DIAGNOSIS — G894 Chronic pain syndrome: Secondary | ICD-10-CM | POA: Diagnosis not present

## 2024-08-09 DIAGNOSIS — E782 Mixed hyperlipidemia: Secondary | ICD-10-CM | POA: Diagnosis not present

## 2024-08-09 DIAGNOSIS — J069 Acute upper respiratory infection, unspecified: Secondary | ICD-10-CM | POA: Diagnosis not present

## 2024-08-09 LAB — POC COVID19/FLU A&B COMBO
Covid Antigen, POC: NEGATIVE
Influenza A Antigen, POC: NEGATIVE
Influenza B Antigen, POC: NEGATIVE

## 2024-08-09 MED ORDER — AZITHROMYCIN 250 MG PO TABS: ORAL_TABLET | ORAL | 0 refills | Status: AC

## 2024-08-09 MED ORDER — CELECOXIB 100 MG PO CAPS: 100.0000 mg | ORAL_CAPSULE | Freq: Two times a day (BID) | ORAL | 2 refills | Status: AC

## 2024-08-09 MED ORDER — PROMETHAZINE-DM 6.25-15 MG/5ML PO SYRP: 5.0000 mL | ORAL_SOLUTION | Freq: Four times a day (QID) | ORAL | 0 refills | Status: AC | PRN

## 2024-08-09 NOTE — Assessment & Plan Note (Signed)
 Symptoms suggest viral infection, differential includes viral URI and sinusitis. - Performed swab test for COVID-19 and influenza. - Consider antiviral medication if COVID-19 or influenza test is positive.

## 2024-08-09 NOTE — Assessment & Plan Note (Signed)
 Will see if pt tolerates switch from meloxicam  to celebrex .  Has been on meloxicam  for several years.  Advised pt if pain worsens we can switch back.

## 2024-08-09 NOTE — Progress Notes (Unsigned)
 "  Established Patient Office Visit  Subjective   Patient ID: Denise Burns, female    DOB: [birth year]  Age: 54 y.o. MRN: [id number]  Chief Complaint  Patient presents with   Cough    With congestion, scratchy throat    Discussed the use of AI scribe software for clinical note transcription with the patient, who gave verbal consent to proceed.  History of Present Illness   Denise Burns is a 54 year old female who presents with upper respiratory symptoms.  Symptoms began on Monday with itchy ears, throat, and eyes, accompanied by sneezing. Despite taking her usual Zyrtec nightly and using Astelin  nasal spray, her symptoms worsened, leading her to use Afrin. She now experiences clogged ears, yellow nasal discharge, significant coughing, dehydration, and difficulty breathing. No known exposure to sick individuals and no fevers reported.  She has a history of hyperlipidemia and is on Crestor , which she takes without issues.  She mentions experiencing back pain previously, which she suspected might be related to a UTI or kidney stone, but this has since improved. The pain was localized to the left side of her back and did not radiate down her leg.  Her current medications include azelastine , Zyrtec, Lexapro  5 mg, fludrocortisone , Norco, hydrocortisone , and meloxicam , which she takes daily. She has not used Flonase  or Prevacid  recently. She occasionally uses Sudafed and takes Zanaflex  as needed, approximately once a day.          The ASCVD Risk score (Arnett DK, et al., 2019) failed to calculate for the following reasons:   According to ACC/AHA guidelines, patients with an LDL-C level greater than 190 mg/dL (5.08 mmol/L) should be considered for high-intensity statin therapy. This patient's most recent LDL-C level is 198 mg/dL.  Health Maintenance Due  Topic Date Due   Hepatitis C Screening  Never done   DTaP/Tdap/Td (1 - Tdap) Never done   Hepatitis B Vaccines 19-59 Average  Risk (1 of 3 - 19+ 3-dose series) Never done   Pneumococcal Vaccine: 50+ Years (1 of 1 - PCV) Never done   Cervical Cancer Screening (HPV/Pap Cotest)  06/18/2021   Influenza Vaccine  03/01/2024   COVID-19 Vaccine (4 - 2025-26 season) 04/01/2024      Objective:     BP (!) 143/87   Pulse (!) 105   Temp 98.6 F (37 C) (Oral)   Ht 5' 4 (1.626 m)   Wt 186 lb 4 oz (84.5 kg)   LMP 12/01/2020   SpO2 100%   BMI 31.97 kg/m  {Vitals History (Optional):23777}  Physical Exam     Gen: alert, oriented Pulm: no respiratory distress Psych: pleasant affect       No results found for any visits on 08/09/24.      Assessment & Plan:   Upper respiratory tract infection, unspecified type Assessment & Plan: Symptoms suggest viral infection, differential includes viral URI and sinusitis. - Performed swab test for COVID-19 and influenza. - Consider antiviral medication if COVID-19 or influenza test is positive.  Orders: -     POC Covid19/Flu A&B Antigen  Mixed hyperlipidemia Assessment & Plan: Managed with Crestor , cholesterol levels pending. - Ordered cholesterol level test.  Orders: -     Lipid panel -     Comprehensive metabolic panel with GFR  Chronic pain syndrome Assessment & Plan: Will see if pt tolerates switch from meloxicam  to celebrex .  Has been on meloxicam  for several years.  Advised pt if pain worsens we can switch  back.     Other orders -     Celecoxib ; Take 1 capsule (100 mg total) by mouth 2 (two) times daily.  Dispense: 60 capsule; Refill: 2         Return in about 4 months (around 12/07/2024) for physical.    Toribio MARLA Slain, MD  "

## 2024-08-09 NOTE — Assessment & Plan Note (Signed)
 Managed with Crestor , cholesterol levels pending. - Ordered cholesterol level test.

## 2024-08-09 NOTE — Patient Instructions (Addendum)
" °  VISIT SUMMARY: Today, you were seen for upper respiratory symptoms including itchy ears, throat, and eyes, sneezing, clogged ears, yellow nasal discharge, significant coughing, dehydration, and difficulty breathing. We discussed your ongoing management of allergic rhinitis and hyperlipidemia, and performed tests for COVID-19 and influenza.  YOUR PLAN: ACUTE UPPER RESPIRATORY INFECTION WITH SINUSITIS: You have symptoms of a viral infection, possibly a sinus infection. -We performed a swab test for COVID-19 and influenza. . They were negative - you likely have a viral infection ,but bc your heart rate has been elevated I do think it would be a good Idea to send in azithromycin , which has anti inflammatory properties in addition to the antibiotic treatment.   ALLERGIC RHINITIS: You have chronic allergic rhinitis. -Continue taking Zyrtec and azelastine  as usual.  HYPERLIPIDEMIA: Your cholesterol levels are being monitored. -We ordered a cholesterol level test to monitor your condition.  GENERAL HEALTH MAINTENANCE: We discussed the risks of long-term NSAID use. -Consider switching from meloxicam  to Celebrex  100 mg twice daily or 200 mg once daily to reduce gastrointestinal risks. -Be aware of the potential risks associated with long-term NSAID use.   "

## 2024-08-10 LAB — COMPREHENSIVE METABOLIC PANEL WITH GFR
ALT: 27 IU/L (ref 0–32)
AST: 27 IU/L (ref 0–40)
Albumin: 4.6 g/dL (ref 3.8–4.9)
Alkaline Phosphatase: 71 IU/L (ref 49–135)
BUN/Creatinine Ratio: 14 (ref 9–23)
BUN: 11 mg/dL (ref 6–24)
Bilirubin Total: 1.3 mg/dL — ABNORMAL HIGH (ref 0.0–1.2)
CO2: 24 mmol/L (ref 20–29)
Calcium: 9.7 mg/dL (ref 8.7–10.2)
Chloride: 104 mmol/L (ref 96–106)
Creatinine, Ser: 0.81 mg/dL (ref 0.57–1.00)
Globulin, Total: 2.2 g/dL (ref 1.5–4.5)
Glucose: 92 mg/dL (ref 70–99)
Potassium: 4.1 mmol/L (ref 3.5–5.2)
Sodium: 143 mmol/L (ref 134–144)
Total Protein: 6.8 g/dL (ref 6.0–8.5)
eGFR: 87 mL/min/1.73

## 2024-08-10 LAB — LIPID PANEL
Chol/HDL Ratio: 3.5 ratio (ref 0.0–4.4)
Cholesterol, Total: 176 mg/dL (ref 100–199)
HDL: 51 mg/dL
LDL Chol Calc (NIH): 96 mg/dL (ref 0–99)
Triglycerides: 167 mg/dL — ABNORMAL HIGH (ref 0–149)
VLDL Cholesterol Cal: 29 mg/dL (ref 5–40)

## 2024-08-13 ENCOUNTER — Ambulatory Visit: Payer: Self-pay | Admitting: Family Medicine

## 2024-08-13 DIAGNOSIS — E66811 Obesity, class 1: Secondary | ICD-10-CM

## 2024-08-13 DIAGNOSIS — E782 Mixed hyperlipidemia: Secondary | ICD-10-CM

## 2024-08-13 DIAGNOSIS — Z6832 Body mass index (BMI) 32.0-32.9, adult: Secondary | ICD-10-CM

## 2024-08-13 DIAGNOSIS — G4733 Obstructive sleep apnea (adult) (pediatric): Secondary | ICD-10-CM

## 2024-08-13 DIAGNOSIS — E271 Primary adrenocortical insufficiency: Secondary | ICD-10-CM

## 2024-08-13 DIAGNOSIS — R7303 Prediabetes: Secondary | ICD-10-CM

## 2024-08-19 ENCOUNTER — Encounter: Payer: Self-pay | Admitting: Physician Assistant

## 2024-08-19 ENCOUNTER — Other Ambulatory Visit: Payer: Self-pay

## 2024-08-19 ENCOUNTER — Ambulatory Visit: Admitting: Physician Assistant

## 2024-08-19 VITALS — HR 104

## 2024-08-19 DIAGNOSIS — L710 Perioral dermatitis: Secondary | ICD-10-CM

## 2024-08-19 DIAGNOSIS — L719 Rosacea, unspecified: Secondary | ICD-10-CM | POA: Diagnosis not present

## 2024-08-19 MED ORDER — METRONIDAZOLE 0.75 % EX CREA
TOPICAL_CREAM | Freq: Two times a day (BID) | CUTANEOUS | 2 refills | Status: AC
  Filled 2024-08-19: qty 45, 30d supply, fill #0

## 2024-08-19 NOTE — Progress Notes (Signed)
" ° °  New Patient Visit   Subjective  Denise Burns is a 54 y.o. female NEW PATIENT who presents for the following: peri-oral dermatitis and cyst.   She has been seen in the past for both. Has been prescribed oral doxycycline . Currently does not have a flare. Cheeks and nose get red.     The following portions of the chart were reviewed this encounter and updated as appropriate: medications, allergies, medical history  Review of Systems:  No other skin or systemic complaints except as noted in HPI or Assessment and Plan.  Objective  Well appearing patient in no apparent distress; mood and affect are within normal limits.  A focused examination was performed of the following areas: Face  Relevant exam findings are noted in the Assessment and Plan.    Assessment & Plan   ROSACEA Exam Mid face erythema with telangiectasias   Not at goal  Rosacea is a chronic progressive skin condition usually affecting the face of adults, causing redness and/or acne bumps. It is treatable but not curable. It sometimes affects the eyes (ocular rosacea) as well. It may respond to topical and/or systemic medication and can flare with stress, sun exposure, alcohol, exercise, topical steroids (including hydrocortisone /cortisone 10) and some foods.  Daily application of broad spectrum spf 30+ sunscreen to face is recommended to reduce flares.  Patient admits grittiness of the eyes  Treatment Plan Metronidazole  - 0.75% cream twice daily  PERI-ORAL DERMATITIS  - clear today  - to call if flares     ROSACEA   This Visit - metroNIDAZOLE  (METROCREAM ) 0.75 % cream - Apply to face twice daily PERIORAL DERMATITIS    Return if symptoms worsen or fail to improve.  I, Doyce Pan, CMA, am acting as scribe for Latrelle Bazar K, PA-C.   Documentation: I have reviewed the above documentation for accuracy and completeness, and I agree with the above.  [name] K, PA-C     "

## 2024-08-19 NOTE — Patient Instructions (Signed)

## 2024-08-20 ENCOUNTER — Other Ambulatory Visit: Payer: Self-pay

## 2024-08-22 ENCOUNTER — Encounter: Payer: Self-pay | Admitting: Physician Assistant

## 2024-09-10 ENCOUNTER — Ambulatory Visit: Admitting: Family Medicine

## 2024-09-10 ENCOUNTER — Encounter: Admitting: Nurse Practitioner

## 2024-10-01 ENCOUNTER — Ambulatory Visit: Admitting: Physician Assistant

## 2024-10-03 ENCOUNTER — Ambulatory Visit: Admitting: Family Medicine

## 2024-10-21 ENCOUNTER — Ambulatory Visit: Admitting: Internal Medicine
